# Patient Record
Sex: Male | Born: 1960 | Race: White | Hispanic: No | Marital: Single | Smoking: Former smoker
Health system: Southern US, Community
[De-identification: ages and names within clinical notes are randomized; demographics above are authoritative.]

## PROBLEM LIST (undated history)

## (undated) DIAGNOSIS — N200 Calculus of kidney: Secondary | ICD-10-CM

## (undated) DIAGNOSIS — C801 Malignant (primary) neoplasm, unspecified: Secondary | ICD-10-CM

## (undated) DIAGNOSIS — F410 Panic disorder [episodic paroxysmal anxiety] without agoraphobia: Secondary | ICD-10-CM

## (undated) DIAGNOSIS — R55 Syncope and collapse: Secondary | ICD-10-CM

## (undated) DIAGNOSIS — K08109 Complete loss of teeth, unspecified cause, unspecified class: Secondary | ICD-10-CM

## (undated) DIAGNOSIS — Z972 Presence of dental prosthetic device (complete) (partial): Secondary | ICD-10-CM

## (undated) DIAGNOSIS — I10 Essential (primary) hypertension: Secondary | ICD-10-CM

## (undated) DIAGNOSIS — M1990 Unspecified osteoarthritis, unspecified site: Secondary | ICD-10-CM

## (undated) HISTORY — PX: WISDOM TOOTH EXTRACTION: SHX21

## (undated) HISTORY — DX: Syncope and collapse: R55

## (undated) HISTORY — DX: Panic disorder (episodic paroxysmal anxiety): F41.0

## (undated) HISTORY — PX: WRIST SURGERY: SHX841

## (undated) HISTORY — DX: Essential (primary) hypertension: I10

---

## 1998-01-13 ENCOUNTER — Emergency Department (HOSPITAL_COMMUNITY): Admission: EM | Admit: 1998-01-13 | Discharge: 1998-01-13 | Payer: Self-pay | Admitting: Emergency Medicine

## 1998-01-14 ENCOUNTER — Emergency Department (HOSPITAL_COMMUNITY): Admission: EM | Admit: 1998-01-14 | Discharge: 1998-01-14 | Payer: Self-pay | Admitting: Emergency Medicine

## 1998-01-31 ENCOUNTER — Emergency Department (HOSPITAL_COMMUNITY): Admission: EM | Admit: 1998-01-31 | Discharge: 1998-01-31 | Payer: Self-pay | Admitting: Emergency Medicine

## 1999-01-02 ENCOUNTER — Emergency Department (HOSPITAL_COMMUNITY): Admission: EM | Admit: 1999-01-02 | Discharge: 1999-01-02 | Payer: Self-pay | Admitting: Emergency Medicine

## 1999-01-02 ENCOUNTER — Encounter: Payer: Self-pay | Admitting: Emergency Medicine

## 1999-05-26 ENCOUNTER — Emergency Department (HOSPITAL_COMMUNITY): Admission: EM | Admit: 1999-05-26 | Discharge: 1999-05-26 | Payer: Self-pay | Admitting: Emergency Medicine

## 2000-04-11 ENCOUNTER — Emergency Department (HOSPITAL_COMMUNITY): Admission: EM | Admit: 2000-04-11 | Discharge: 2000-04-11 | Payer: Self-pay | Admitting: Emergency Medicine

## 2000-09-19 ENCOUNTER — Emergency Department (HOSPITAL_COMMUNITY): Admission: EM | Admit: 2000-09-19 | Discharge: 2000-09-19 | Payer: Self-pay | Admitting: Emergency Medicine

## 2001-03-08 ENCOUNTER — Emergency Department (HOSPITAL_COMMUNITY): Admission: EM | Admit: 2001-03-08 | Discharge: 2001-03-08 | Payer: Self-pay | Admitting: Emergency Medicine

## 2001-03-08 ENCOUNTER — Encounter: Payer: Self-pay | Admitting: Emergency Medicine

## 2001-03-30 ENCOUNTER — Emergency Department (HOSPITAL_COMMUNITY): Admission: EM | Admit: 2001-03-30 | Discharge: 2001-03-30 | Payer: Self-pay | Admitting: Emergency Medicine

## 2003-01-22 ENCOUNTER — Encounter: Payer: Self-pay | Admitting: Emergency Medicine

## 2003-01-22 ENCOUNTER — Emergency Department (HOSPITAL_COMMUNITY): Admission: EM | Admit: 2003-01-22 | Discharge: 2003-01-22 | Payer: Self-pay | Admitting: Emergency Medicine

## 2003-11-19 ENCOUNTER — Emergency Department (HOSPITAL_COMMUNITY): Admission: EM | Admit: 2003-11-19 | Discharge: 2003-11-19 | Payer: Self-pay | Admitting: Emergency Medicine

## 2004-03-23 ENCOUNTER — Emergency Department (HOSPITAL_COMMUNITY): Admission: EM | Admit: 2004-03-23 | Discharge: 2004-03-23 | Payer: Self-pay | Admitting: Emergency Medicine

## 2004-04-22 ENCOUNTER — Emergency Department (HOSPITAL_COMMUNITY): Admission: EM | Admit: 2004-04-22 | Discharge: 2004-04-22 | Payer: Self-pay | Admitting: *Deleted

## 2004-12-08 ENCOUNTER — Emergency Department (HOSPITAL_COMMUNITY): Admission: EM | Admit: 2004-12-08 | Discharge: 2004-12-08 | Payer: Self-pay | Admitting: Emergency Medicine

## 2008-04-04 ENCOUNTER — Emergency Department (HOSPITAL_COMMUNITY): Admission: EM | Admit: 2008-04-04 | Discharge: 2008-04-04 | Payer: Self-pay | Admitting: Emergency Medicine

## 2008-04-07 ENCOUNTER — Emergency Department (HOSPITAL_COMMUNITY): Admission: EM | Admit: 2008-04-07 | Discharge: 2008-04-07 | Payer: Self-pay | Admitting: Emergency Medicine

## 2008-04-08 ENCOUNTER — Emergency Department (HOSPITAL_COMMUNITY): Admission: EM | Admit: 2008-04-08 | Discharge: 2008-04-08 | Payer: Self-pay | Admitting: Emergency Medicine

## 2009-02-19 ENCOUNTER — Emergency Department (HOSPITAL_COMMUNITY): Admission: EM | Admit: 2009-02-19 | Discharge: 2009-02-19 | Payer: Self-pay | Admitting: Emergency Medicine

## 2009-05-06 ENCOUNTER — Emergency Department (HOSPITAL_COMMUNITY): Admission: EM | Admit: 2009-05-06 | Discharge: 2009-05-06 | Payer: Self-pay | Admitting: Emergency Medicine

## 2009-05-08 ENCOUNTER — Emergency Department: Payer: Self-pay | Admitting: Unknown Physician Specialty

## 2009-05-30 ENCOUNTER — Emergency Department (HOSPITAL_COMMUNITY): Admission: EM | Admit: 2009-05-30 | Discharge: 2009-05-30 | Payer: Self-pay | Admitting: Emergency Medicine

## 2009-06-05 ENCOUNTER — Emergency Department (HOSPITAL_COMMUNITY): Admission: EM | Admit: 2009-06-05 | Discharge: 2009-06-05 | Payer: Self-pay | Admitting: Emergency Medicine

## 2009-06-25 ENCOUNTER — Emergency Department: Payer: Self-pay | Admitting: Emergency Medicine

## 2009-07-03 ENCOUNTER — Emergency Department (HOSPITAL_COMMUNITY): Admission: EM | Admit: 2009-07-03 | Discharge: 2009-07-03 | Payer: Self-pay | Admitting: Emergency Medicine

## 2009-07-07 ENCOUNTER — Emergency Department (HOSPITAL_COMMUNITY): Admission: EM | Admit: 2009-07-07 | Discharge: 2009-07-07 | Payer: Self-pay | Admitting: Emergency Medicine

## 2009-07-25 ENCOUNTER — Emergency Department (HOSPITAL_COMMUNITY): Admission: EM | Admit: 2009-07-25 | Discharge: 2009-07-25 | Payer: Self-pay | Admitting: Emergency Medicine

## 2009-07-30 ENCOUNTER — Emergency Department (HOSPITAL_COMMUNITY): Admission: EM | Admit: 2009-07-30 | Discharge: 2009-07-30 | Payer: Self-pay | Admitting: Emergency Medicine

## 2009-08-02 ENCOUNTER — Emergency Department: Payer: Self-pay | Admitting: Unknown Physician Specialty

## 2009-08-05 ENCOUNTER — Emergency Department (HOSPITAL_COMMUNITY): Admission: EM | Admit: 2009-08-05 | Discharge: 2009-08-05 | Payer: Self-pay | Admitting: Emergency Medicine

## 2009-08-24 ENCOUNTER — Emergency Department (HOSPITAL_COMMUNITY): Admission: EM | Admit: 2009-08-24 | Discharge: 2009-08-24 | Payer: Self-pay | Admitting: Emergency Medicine

## 2009-08-25 ENCOUNTER — Emergency Department: Payer: Self-pay | Admitting: Emergency Medicine

## 2009-08-26 ENCOUNTER — Emergency Department (HOSPITAL_COMMUNITY): Admission: EM | Admit: 2009-08-26 | Discharge: 2009-08-26 | Payer: Self-pay | Admitting: Emergency Medicine

## 2009-08-29 ENCOUNTER — Emergency Department: Payer: Self-pay | Admitting: Emergency Medicine

## 2009-09-02 ENCOUNTER — Emergency Department (HOSPITAL_COMMUNITY): Admission: EM | Admit: 2009-09-02 | Discharge: 2009-09-02 | Payer: Self-pay | Admitting: Emergency Medicine

## 2009-09-17 ENCOUNTER — Emergency Department: Payer: Self-pay | Admitting: Emergency Medicine

## 2009-09-22 ENCOUNTER — Encounter: Payer: Self-pay | Admitting: Family Medicine

## 2009-09-22 ENCOUNTER — Ambulatory Visit: Payer: Self-pay | Admitting: Family Medicine

## 2009-09-22 DIAGNOSIS — F410 Panic disorder [episodic paroxysmal anxiety] without agoraphobia: Secondary | ICD-10-CM | POA: Insufficient documentation

## 2009-09-22 DIAGNOSIS — I10 Essential (primary) hypertension: Secondary | ICD-10-CM | POA: Diagnosis present

## 2009-09-26 ENCOUNTER — Encounter: Payer: Self-pay | Admitting: Family Medicine

## 2009-09-29 ENCOUNTER — Observation Stay (HOSPITAL_COMMUNITY): Admission: EM | Admit: 2009-09-29 | Discharge: 2009-09-29 | Payer: Self-pay | Admitting: Emergency Medicine

## 2009-09-29 ENCOUNTER — Encounter (INDEPENDENT_AMBULATORY_CARE_PROVIDER_SITE_OTHER): Payer: Self-pay | Admitting: Emergency Medicine

## 2009-09-29 ENCOUNTER — Encounter: Payer: Self-pay | Admitting: *Deleted

## 2009-10-02 ENCOUNTER — Encounter: Payer: Self-pay | Admitting: Family Medicine

## 2009-10-02 ENCOUNTER — Ambulatory Visit: Payer: Self-pay | Admitting: Family Medicine

## 2009-10-02 LAB — CONVERTED CEMR LAB
ALT: 18 units/L (ref 0–53)
AST: 17 units/L (ref 0–37)
CO2: 28 meq/L (ref 19–32)
Calcium: 10 mg/dL (ref 8.4–10.5)
Chloride: 99 meq/L (ref 96–112)
Cholesterol: 190 mg/dL (ref 0–200)
HDL: 35 mg/dL — ABNORMAL LOW (ref >39)
Potassium: 3.6 meq/L (ref 3.5–5.3)
Sodium: 139 meq/L (ref 135–145)
Total CHOL/HDL Ratio: 5.4
VLDL: 25 mg/dL (ref 0–40)

## 2009-10-13 ENCOUNTER — Encounter: Payer: Self-pay | Admitting: Family Medicine

## 2009-10-14 ENCOUNTER — Ambulatory Visit: Payer: Self-pay | Admitting: Family Medicine

## 2009-10-14 DIAGNOSIS — M25539 Pain in unspecified wrist: Secondary | ICD-10-CM | POA: Insufficient documentation

## 2009-10-23 ENCOUNTER — Telehealth: Payer: Self-pay | Admitting: Family Medicine

## 2009-10-24 ENCOUNTER — Telehealth: Payer: Self-pay | Admitting: Family Medicine

## 2009-10-27 ENCOUNTER — Telehealth: Payer: Self-pay | Admitting: Family Medicine

## 2009-10-31 ENCOUNTER — Emergency Department (HOSPITAL_COMMUNITY): Admission: EM | Admit: 2009-10-31 | Discharge: 2009-10-31 | Payer: Self-pay | Admitting: Emergency Medicine

## 2009-11-17 ENCOUNTER — Ambulatory Visit: Payer: Self-pay | Admitting: Family Medicine

## 2009-11-17 DIAGNOSIS — G4700 Insomnia, unspecified: Secondary | ICD-10-CM | POA: Insufficient documentation

## 2009-12-01 ENCOUNTER — Telehealth: Payer: Self-pay | Admitting: Family Medicine

## 2009-12-07 ENCOUNTER — Emergency Department (HOSPITAL_COMMUNITY): Admission: EM | Admit: 2009-12-07 | Discharge: 2009-12-07 | Payer: Self-pay | Admitting: Emergency Medicine

## 2009-12-08 ENCOUNTER — Ambulatory Visit: Payer: Self-pay | Admitting: Family Medicine

## 2009-12-08 ENCOUNTER — Encounter: Payer: Self-pay | Admitting: Family Medicine

## 2009-12-17 ENCOUNTER — Ambulatory Visit: Payer: Self-pay | Admitting: Family Medicine

## 2009-12-17 ENCOUNTER — Telehealth: Payer: Self-pay | Admitting: Family Medicine

## 2010-01-02 ENCOUNTER — Ambulatory Visit: Payer: Self-pay | Admitting: Family Medicine

## 2010-01-02 ENCOUNTER — Encounter: Payer: Self-pay | Admitting: Family Medicine

## 2010-01-02 LAB — CONVERTED CEMR LAB
Bilirubin Urine: NEGATIVE
Nitrite: NEGATIVE
Protein, U semiquant: NEGATIVE
Urobilinogen, UA: 0.2
pH: 7

## 2010-01-05 ENCOUNTER — Ambulatory Visit: Payer: Self-pay | Admitting: Family Medicine

## 2010-01-05 LAB — CONVERTED CEMR LAB
Glucose, Urine, Semiquant: NEGATIVE
Nitrite: NEGATIVE
Protein, U semiquant: NEGATIVE
WBC Urine, dipstick: NEGATIVE
pH: 6.5

## 2010-01-09 ENCOUNTER — Ambulatory Visit (HOSPITAL_COMMUNITY): Admission: RE | Admit: 2010-01-09 | Discharge: 2010-01-09 | Payer: Self-pay | Admitting: Family Medicine

## 2010-01-12 ENCOUNTER — Encounter: Payer: Self-pay | Admitting: Sports Medicine

## 2010-01-12 ENCOUNTER — Telehealth: Payer: Self-pay | Admitting: Family Medicine

## 2010-01-12 ENCOUNTER — Ambulatory Visit (HOSPITAL_COMMUNITY): Admission: RE | Admit: 2010-01-12 | Discharge: 2010-01-12 | Payer: Self-pay | Admitting: Family Medicine

## 2010-01-12 ENCOUNTER — Ambulatory Visit: Payer: Self-pay | Admitting: Family Medicine

## 2010-01-12 DIAGNOSIS — R55 Syncope and collapse: Secondary | ICD-10-CM | POA: Insufficient documentation

## 2010-01-12 LAB — CONVERTED CEMR LAB
ALT: 16 units/L (ref 0–53)
AST: 20 units/L (ref 0–37)
Albumin: 5 g/dL (ref 3.5–5.2)
Alkaline Phosphatase: 68 units/L (ref 39–117)
BUN: 12 mg/dL (ref 6–23)
Basophils Absolute: 0 K/uL (ref 0.0–0.1)
Basophils Relative: 0 % (ref 0–1)
CO2: 21 meq/L (ref 19–32)
Calcium: 9.9 mg/dL (ref 8.4–10.5)
Chloride: 103 meq/L (ref 96–112)
Creatinine, Ser: 0.97 mg/dL (ref 0.40–1.50)
Eosinophils Absolute: 0.3 K/uL (ref 0.0–0.7)
Eosinophils Relative: 4 % (ref 0–5)
Glucose, Bld: 84 mg/dL (ref 70–99)
HCT: 42 % (ref 39.0–52.0)
Hemoglobin: 14.3 g/dL (ref 13.0–17.0)
Lymphocytes Relative: 22 % (ref 12–46)
Lymphs Abs: 2 K/uL (ref 0.7–4.0)
MCHC: 34 g/dL (ref 30.0–36.0)
MCV: 85 fL (ref 78.0–100.0)
Monocytes Absolute: 0.7 10*3/uL (ref 0.1–1.0)
Monocytes Relative: 7 % (ref 3–12)
Neutro Abs: 6.1 10*3/uL (ref 1.7–7.7)
Neutrophils Relative %: 67 % (ref 43–77)
Platelets: 313 K/uL (ref 150–400)
Potassium: 3.5 meq/L (ref 3.5–5.3)
RBC: 4.94 M/uL (ref 4.22–5.81)
RDW: 13.7 % (ref 11.5–15.5)
Sodium: 139 meq/L (ref 135–145)
TSH: 3.813 microintl units/mL (ref 0.350–4.500)
Total Bilirubin: 0.7 mg/dL (ref 0.3–1.2)
Total Protein: 7.3 g/dL (ref 6.0–8.3)
WBC: 9.1 10*3/microliter (ref 4.0–10.5)

## 2010-01-13 ENCOUNTER — Telehealth: Payer: Self-pay | Admitting: Family Medicine

## 2010-01-15 ENCOUNTER — Encounter: Payer: Self-pay | Admitting: Family Medicine

## 2010-01-29 ENCOUNTER — Emergency Department: Payer: Self-pay | Admitting: Unknown Physician Specialty

## 2010-01-30 ENCOUNTER — Emergency Department (HOSPITAL_COMMUNITY): Admission: EM | Admit: 2010-01-30 | Discharge: 2010-01-30 | Payer: Self-pay | Admitting: Family Medicine

## 2010-02-02 ENCOUNTER — Encounter: Payer: Self-pay | Admitting: Family Medicine

## 2010-02-02 ENCOUNTER — Ambulatory Visit: Payer: Self-pay | Admitting: Family Medicine

## 2010-02-02 ENCOUNTER — Encounter: Payer: Self-pay | Admitting: *Deleted

## 2010-02-12 ENCOUNTER — Ambulatory Visit: Payer: Self-pay | Admitting: Family Medicine

## 2010-02-17 ENCOUNTER — Encounter: Payer: Self-pay | Admitting: Family Medicine

## 2010-02-18 ENCOUNTER — Telehealth: Payer: Self-pay | Admitting: Family Medicine

## 2010-02-23 ENCOUNTER — Telehealth: Payer: Self-pay | Admitting: Family Medicine

## 2010-02-23 ENCOUNTER — Ambulatory Visit: Payer: Self-pay | Admitting: Family Medicine

## 2010-03-02 ENCOUNTER — Ambulatory Visit: Payer: Self-pay | Admitting: Internal Medicine

## 2010-03-11 ENCOUNTER — Ambulatory Visit: Payer: Self-pay | Admitting: Family Medicine

## 2010-03-11 ENCOUNTER — Telehealth: Payer: Self-pay | Admitting: Family Medicine

## 2010-03-17 ENCOUNTER — Encounter: Payer: Self-pay | Admitting: Family Medicine

## 2010-03-19 ENCOUNTER — Ambulatory Visit: Payer: Self-pay | Admitting: Family Medicine

## 2010-03-30 ENCOUNTER — Emergency Department (HOSPITAL_COMMUNITY): Admission: EM | Admit: 2010-03-30 | Discharge: 2010-03-30 | Payer: Self-pay | Admitting: Emergency Medicine

## 2010-04-03 ENCOUNTER — Encounter (INDEPENDENT_AMBULATORY_CARE_PROVIDER_SITE_OTHER): Payer: Self-pay | Admitting: *Deleted

## 2010-04-14 ENCOUNTER — Telehealth: Payer: Self-pay | Admitting: Family Medicine

## 2010-04-14 ENCOUNTER — Ambulatory Visit: Payer: Self-pay | Admitting: Family Medicine

## 2010-04-15 ENCOUNTER — Encounter: Payer: Self-pay | Admitting: *Deleted

## 2010-04-15 ENCOUNTER — Telehealth: Payer: Self-pay | Admitting: Family Medicine

## 2010-04-15 ENCOUNTER — Ambulatory Visit: Payer: Self-pay | Admitting: Family Medicine

## 2010-04-19 ENCOUNTER — Emergency Department: Payer: Self-pay | Admitting: Unknown Physician Specialty

## 2010-04-21 ENCOUNTER — Ambulatory Visit: Payer: Self-pay | Admitting: Family Medicine

## 2010-05-04 ENCOUNTER — Telehealth: Payer: Self-pay | Admitting: Family Medicine

## 2010-05-13 ENCOUNTER — Telehealth: Payer: Self-pay | Admitting: Family Medicine

## 2010-05-13 ENCOUNTER — Ambulatory Visit: Payer: Self-pay | Admitting: Family Medicine

## 2010-06-09 ENCOUNTER — Ambulatory Visit: Payer: Self-pay | Admitting: Family Medicine

## 2010-07-10 ENCOUNTER — Emergency Department (HOSPITAL_COMMUNITY): Admission: EM | Admit: 2010-07-10 | Discharge: 2010-07-10 | Payer: Self-pay | Admitting: Emergency Medicine

## 2010-07-13 ENCOUNTER — Ambulatory Visit: Payer: Self-pay | Admitting: Family Medicine

## 2010-07-13 DIAGNOSIS — M79609 Pain in unspecified limb: Secondary | ICD-10-CM | POA: Insufficient documentation

## 2010-11-10 NOTE — Progress Notes (Signed)
Summary: referral prob  Phone Note Call from Patient Call back at Home Phone [phone]   Caller: Patient Summary of Call: was given referral and they have denied him and he has a broken wrist and needs to know what to do. Initial call taken by: Audie Clear,  Feb 18, 2010 2:13 PM  Follow-up for Phone Call         spoke with patient . he states he fell today and fell directly on wrist. wrist is swollen and very painful. suggested given the time of day that he go to urgent care for evaluation now. advised will send message to Dr. Georgina Snell to advise about other options for for referral since there are no orthopedist that are available thru [facility]. Follow-up by: Marcell Barlow RN,  Feb 18, 2010 3:25 PM

## 2010-11-10 NOTE — Miscellaneous (Signed)
Summary: walk in  Clinical Lists Changes came in asking for f/u & referral to ortho. last Thursday he was using a hole driller on concrete & the device jamed & twisted his wrist & fx bones. some were sticking out of arm. [facility] went to [facility]. they wrapped it & told him to come here for referral to an ortho office. placed in 8:30 slot.Elige Radon RN  February 02, 2010 8:31 AM

## 2010-11-10 NOTE — Assessment & Plan Note (Signed)
Summary: teeth pain & anxiety/[place]   Vital Signs:  Patient profile:   50 year old male Weight:      158 pounds Temp:     97.8 degrees F oral Pulse rate:   81 / minute BP sitting:   123 / 79  (left arm) Cuff size:   regular  Vitals Entered By: Schuyler Amor CMA (December 08, 2009 9:29 AM) CC: dental pain and anxiety Is Patient Diabetic? No Pain Assessment Patient in pain? yes     Location: dental Intensity: 10   Primary Care Provider:  Lynne Leader  CC:  dental pain and anxiety.  History of Present Illness: CC: dental pain and anxiety   1. anxiety - feeling really "nervous" since friday.  Trazodone not working for sleep, still feels restless.  Thinks stemming from tooth pain (see #2)  2. dental pain - 6 teeth pulled 2 wks ago.  Given percocets for pain by Dr. Georgina Snell.  Pt states allergic to vicodin and motrin Q 6 hours not working.  dentist would not prescribe percocets.  Has appt 12/23/2009 for removal of more teeth.   currently on amoxicillin per patient.  Asks for refills on HCTZ and zoloft.  Has 5 refills left on sertraline and 6 refills left on HCTZ, advised can call pharmacy to refill.  Habits & Providers  Alcohol-Tobacco-Diet     Tobacco Status: never  Current Medications (verified): 1)  Percocet 5-325 Mg Tabs (Oxycodone-Acetaminophen) .... Take 1 Pill By Mouth Q 6 Hrs As Needed Pain 2)  Penicillin V Potassium 500 Mg Tabs (Penicillin V Potassium) .Marland Kitchen.. 1 Tab By Mouth Two Times A Day Untill Tooth Extraction 3)  Zoloft 50 Mg Tabs (Sertraline Hcl) .... Take 1 Per Day For 1 Week Then $RemoveB'75mg'QxxKdnsJ$  Per Day For 1 Week Then 2 Per Day ($Remove'100mg'xFuUiMK$ ) Until You See Me. 4)  Hydrochlorothiazide 25 Mg Tabs (Hydrochlorothiazide) .... Take 1 Pill By Mouth Daily 5)  Trazodone Hcl 50 Mg Tabs (Trazodone Hcl) .Marland Kitchen.. 1 By Mouth At Bedtime As Needed Insomnia  Allergies (verified): 1)  Hydrocodone PMH-FH-SH reviewed for relevance  Physical Exam  General:  VS noted. Well appearing NAD Head:   Normocephalic and atraumatic without obvious abnormalities. No apparent alopecia or balding. Mouth:  s/p dental work.  slight swelling right upper premolars.   Neck:  No deformities, masses, or tenderness noted.  No LAD Psych:  anxious, nervous.    Impression & Recommendations:  Problem # 1:  DENTAL CARIES OF ROOT SURFACE (ICD-521.08)  refilled percocet #30, RF 0.  Advised to use only as breakthrough after scheduled motrin.  Advised only temporary narcotics while he gets through dental work and as has allergy to hydrocodone (GI upset and rash).  advised to ice jaw.  Orders: [place]- Est Level  3 ([address])  Problem # 2:  PANIC DISORDER (ICD-300.01)  much improved on zoloft, continue this.  advised will treat as pain exacerbating anxiety, and to return if not improved after controlling pain. His updated medication list for this problem includes:    Zoloft 50 Mg Tabs (Sertraline hcl) .Marland Kitchen... Take 1 per day for 1 week then $RemoveB'75mg'uzefQWfe$  per day for 1 week then 2 per day ($Remove'100mg'OMwAsgo$ ) until you see me.    Trazodone Hcl 50 Mg Tabs (Trazodone hcl) .Marland Kitchen... 1 by mouth at bedtime as needed insomnia  Orders: [place]- Est Level  3 ([address]213)  Complete Medication List: 1)  Percocet 5-325 Mg Tabs (Oxycodone-acetaminophen) .... Take 1 pill by mouth q 6 hrs as needed  pain 2)  Penicillin V Potassium 500 Mg Tabs (Penicillin v potassium) .Marland Kitchen.. 1 tab by mouth two times a day untill tooth extraction 3)  Zoloft 50 Mg Tabs (Sertraline hcl) .... Take 1 per day for 1 week then $RemoveB'75mg'Zuxqmbei$  per day for 1 week then 2 per day ($Remove'100mg'ptsIDBV$ ) until you see me. 4)  Hydrochlorothiazide 25 Mg Tabs (Hydrochlorothiazide) .... Take 1 pill by mouth daily 5)  Trazodone Hcl 50 Mg Tabs (Trazodone hcl) .Marland Kitchen.. 1 by mouth at bedtime as needed insomnia  Patient Instructions: 1)  I will give you another prescription for percocets to last you until your dental work next month. 2)  Keep doing motrin and take percocets for breakthrough pain. 3)  Ice jaw 5-10 min 3 x a day to  hlep with pain. 4)  Keep taking amoxicillin.  You can call your pharmacy for refills of HCTZ and sertraline. 5)  Call clinic with questions.  pleasure to see you today. Prescriptions: PERCOCET 5-325 MG TABS (OXYCODONE-ACETAMINOPHEN) take 1 pill by mouth q 6 hrs as needed pain  #30 x 0   Entered and Authorized by:   Ria Bush  MD   Signed by:   Ria Bush  MD on 12/08/2009   Method used:   Print then Give to Patient   RxID:   [id number]

## 2010-11-10 NOTE — Assessment & Plan Note (Signed)
Summary: R flank pain hx stones/[place]   Vital Signs:  Patient profile:   50 year old male Weight:      165 pounds Temp:     97.8 degrees F oral Pulse rate:   67 / minute BP sitting:   130 / 77  (left arm) Cuff size:   regular  Vitals Entered By: Schuyler Amor CMA (January 02, 2010 2:42 PM) CC: right flank pain and bruise Pain Assessment Patient in pain? yes     Location: flank pain Intensity: 10   Primary Care Provider:  Lynne Leader  CC:  right flank pain and bruise.  History of Present Illness: CC: R kidney pain and bruising  Had appt at 1:30pm, presents 1 hour late as "walk in".    50 yo with h/o pain control issues.  I saw him x 2 previously with toothache and ear pain, prescribed percocet x 2.  States mouth and ears doing great now.  7d h/o right flank pain.  h/o kidney stones in past.  Pain described as constant dull throbbing pain, shooting sharp pain as well localized to flank, doesn't radiate to groin.  has small 3cm bruise on skin.  + voids blood in AM.  No dysuria, frequency, urgency.  No nausea/vomiting, other abd pain.  Non smoker.  Taking Naprosyn but not really helping.  Percocets did not fill from last prescription - states lost script.  Habits & Providers  Alcohol-Tobacco-Diet     Tobacco Status: never  Current Medications (verified): 1)  Zoloft 50 Mg Tabs (Sertraline Hcl) .... Take 1 Per Day For 1 Week Then $RemoveB'75mg'GrVZpYkI$  Per Day For 1 Week Then 2 Per Day ($Remove'100mg'VASYsFz$ ) Until You See Me. 2)  Hydrochlorothiazide 25 Mg Tabs (Hydrochlorothiazide) .... Take 1 Pill By Mouth Daily 3)  Trazodone Hcl 50 Mg Tabs (Trazodone Hcl) .Marland Kitchen.. 1 By Mouth At Bedtime As Needed Insomnia 4)  Naprosyn 500 Mg Tabs (Naproxen) .... Take One By Mouth Two Times A Day X 10 Days Then As Needed  Allergies: 1)  Hydrocodone  Past History:  Past medical, surgical, family and social histories (including risk factors) reviewed for relevance to current acute and chronic problems.  Past Medical  History: Reviewed history from 09/22/2009 and no changes required. Left hand and wrist fractures that require surgury.  However patient cannot afford surgury at this time.  He wears a brace and is able to function at work.  HTN SBP 160s in 09/2009 Panic Disorder Dental caries with tooth fractures.  Hx of shingles in the past.   Family History: Reviewed history from 09/22/2009 and no changes required. Father with MI at 50 yo Brother with brain tumor Multiple males with prostate cancer  Social History: Reviewed history from 09/22/2009 and no changes required. Lives in [place] with girlfried. Works part time at a [employer].  No tobacco, alcahol or drugs.  Has a pain contract currently on 09/2009 due to patient with history of multiple ED visits for dental pain prior to establishing as a patient.   Physical Exam  General:  VS noted, initially appears uncomfortable but then moves around room without any grimace. Abdomen:  Bowel sounds positive,abdomen soft and non-tender without masses, organomegaly or hernias noted.  + CVA tenderness on right: says "ouch that hurts" but doesn't grimace or wince when pounding or right flank.   Impression & Recommendations:  Problem # 1:  FLANK PAIN, RIGHT (ICD-789.09) UA negative for blood.  No fevers/nausea/dysuria/systemic sxs to consider pyelo or UTI *(and  UA normal).  Treat with continued naprosyn again, asked to drink plenty of fluids and cranberry juice.  suspect pain-seeking.  f/u with PCP Monday.  or could be msk and treat with nsaids.  NO MORE PERCOCETS, advised as much.  Pt has pain contract with PCP.  The following medications were removed from the medication list:    Percocet 5-325 Mg Tabs (Oxycodone-acetaminophen) .Marland Kitchen... Take 1 pill by mouth q 6 hrs as needed pain His updated medication list for this problem includes:    Naprosyn 500 Mg Tabs (Naproxen) .Marland Kitchen... Take one by mouth two times a day x 10 days then as  needed  Orders: [place]- Est  Level 4 (99214) Urinalysis-[facility] (00000)  Complete Medication List: 1)  Zoloft 50 Mg Tabs (Sertraline hcl) .... Take 1 per day for 1 week then $RemoveB'75mg'uvLhWhYN$  per day for 1 week then 2 per day ($Remove'100mg'qucnSnu$ ) until you see me. 2)  Hydrochlorothiazide 25 Mg Tabs (Hydrochlorothiazide) .... Take 1 pill by mouth daily 3)  Trazodone Hcl 50 Mg Tabs (Trazodone hcl) .Marland Kitchen.. 1 by mouth at bedtime as needed insomnia 4)  Naprosyn 500 Mg Tabs (Naproxen) .... Take one by mouth two times a day x 10 days then as needed  Patient Instructions: 1)  Keep appointment with Dr. Georgina Snell. 2)  Your urine looks normal today.  This could still be a kidney stone or a muscle sprain/strain from the bruise from hitting yourself against something. 3)  Treat with anti inflammatory twice daily (another course of naprosyn), ice/heating pad, and lots of fluids (in case it's stone) to help pass it.  Remember - cranberry juice is good for bladder health. 4)  Call clinic with questions. 5)  Returnsooner if having fevers, nausea, burning with peeing. Prescriptions: NAPROSYN 500 MG TABS (NAPROXEN) take one by mouth two times a day x 10 days then as needed  #30 x 0   Entered and Authorized by:   Ria Bush  MD   Signed by:   Ria Bush  MD on 01/02/2010   Method used:   Faxed to ...       [facility] Department (retail)       [address] [place],   [address]       Ph: [phone]       Fax: [phone]   RxID:   [id number]   Laboratory Results   Urine Tests  Date/Time Received: January 02, 2010 3:15 PM  Date/Time Reported: January 02, 2010 3:20 PM   Routine Urinalysis   Color: yellow Appearance: Clear Glucose: negative   (Normal Range: Negative) Bilirubin: negative   (Normal Range: Negative) Ketone: negative   (Normal Range: Negative) Spec. Gravity: 1.010   (Normal Range: 1.003-1.035) Blood: negative   (Normal Range: Negative) pH: 7.0   (Normal Range: 5.0-8.0) Protein:  negative   (Normal Range: Negative) Urobilinogen: 0.2   (Normal Range: 0-1) Nitrite: negative   (Normal Range: Negative) Leukocyte Esterace: negative   (Normal Range: Negative)    Comments: ...........test performed by...........Marland KitchenHedy Camara, CMA

## 2010-11-10 NOTE — Assessment & Plan Note (Signed)
Summary: jaw pain/[place]   Vital Signs:  Patient profile:   50 year old male Height:      66 inches Weight:      162 pounds BMI:     26.24 Temp:     98.5 degrees F oral Pulse rate:   72 / minute BP sitting:   131 / 88  (right arm) Cuff size:   regular  Vitals Entered By: Tessie Fass CMA (March 19, 2010 3:47 PM) CC: headache and jawpain Is Patient Diabetic? No Pain Assessment Patient in pain? yes     Location: head and jaw Intensity: 10   Primary Care Provider:  Clementeen Graham  CC:  headache and jawpain.  History of Present Illness: 50 year old M:  1. Jaw Pain/HA: x several days, states that it is different from previous visit, now c/o bilateral jaw pain when he chews and jawns, it is an ache that becomes sharp when he opens his jaw wide, and with a slight "popping feeling." states that the headache is "more like a tension-type headache now and not bad," he had several teeth extracted last week and has had the pain since. He has not been able to sleep lately 2/2 headache, so he  has been yawning more.  Habits & Providers  Alcohol-Tobacco-Diet     Tobacco Status: never  Current Medications (verified): 1)  Zoloft 100 Mg Tabs (Sertraline Hcl) .Marland Kitchen.. 1 By Mouth Daily 2)  Sumatriptan Succinate 25 Mg Tabs (Sumatriptan Succinate) .... Take 1 Tab At The Onset of Headache.  May Repeat Times 1 in 2 Hours If No Relief. 3)  Tramadol Hcl 50 Mg Tabs (Tramadol Hcl) .... Take 1 Tab By Mouth Every 6 Hours As Needed For Pain 4)  Mobic 7.5 Mg Tabs (Meloxicam) .... One To Two By Mouth Daily As Needed For Pain and Inflammation 5)  Nortriptyline Hcl 50 Mg Caps (Nortriptyline Hcl) .... One By Mouth Each Night  Allergies (verified): 1)  Hydrocodone PMH-FH-SH reviewed for relevance  Review of Systems General:  Denies chills, fever, and weight loss. ENT:  Complains of difficulty swallowing, ear discharge, earache, nasal congestion, ringing in ears, and sore throat. CV:  Denies chest pain or  discomfort and shortness of breath with exertion. Resp:  Denies cough. GI:  Denies loss of appetite, nausea, and vomiting. MS:  Complains of joint pain; denies joint redness and joint swelling. Derm:  Denies lesion(s) and rash. Neuro:  Complains of headaches; denies brief paralysis, inability to speak, numbness, tingling, visual disturbances, and weakness.  Physical Exam  General:  Well-developed,well-nourished, in no acute distress; alert, appropriate and cooperative throughout examination. Vitals reviewed. Head:  Normocephalic and atraumatic without obvious abnormalities.  Eyes:  No corneal or conjunctival inflammation noted. EOMI. Perrla.Vision grossly normal. Ears:  R ear normal and L ear normal.   Nose:  External nasal examination shows no deformity or inflammation. Nasal mucosa are pink and moist without lesions or exudates. Mouth:  Oral mucosa and oropharynx without lesions or exudates.  Tenderness to palpation at bilateral TMJs. Decreased incisal opening 2/2 pain. Left click heard and distraction palpated with incisal opening. Neck:  No deformities, masses, or tenderness noted.   Impression & Recommendations:  Problem # 1:  TEMPOROMANDIBULAR JOINT PAIN (ICD-524.62) Assessment New Educated patient re: diagnosis, treatment, prevention of further complications. Rec anti-inflammatory medication daily, avoiding excessive chewing, demonstrated neck stretches, f/u with dentist for bite guard. Rx Nortriptyline for help with pain and mucle relaxation (patient has hx of multiple pain  complaints as well).  Orders: [place]- Est Level  3 ([address]109)  Complete Medication List: 1)  Zoloft 100 Mg Tabs (Sertraline hcl) .Marland Kitchen.. 1 by mouth daily 2)  Sumatriptan Succinate 25 Mg Tabs (Sumatriptan succinate) .... Take 1 tab at the onset of headache.  may repeat times 1 in 2 hours if no relief. 3)  Tramadol Hcl 50 Mg Tabs (Tramadol hcl) .... Take 1 tab by mouth every 6 hours as needed for pain 4)  Mobic 7.5 Mg  Tabs (Meloxicam) .... One to two by mouth daily as needed for pain and inflammation 5)  Nortriptyline Hcl 50 Mg Caps (Nortriptyline hcl) .... One by mouth each night  Other Orders: Ketorolac-Toradol 15mg  (U0454)  Patient Instructions: 1)  I am prescribing Mobic or your pain and inflammation. 2)  Take the Nortriptyline at night to help with muscle relaxation and pain. Prescriptions: ZOLOFT 100 MG TABS (SERTRALINE HCL) 1 by mouth daily  #30 x 0   Entered and Authorized by:   Briscoe Deutscher DO   Signed by:   Briscoe Deutscher DO on 03/19/2010   Method used:   Print then Give to Patient   RxID:   [id number] NORTRIPTYLINE HCL 50 MG CAPS (NORTRIPTYLINE HCL) one by mouth each night  #30 x 0   Entered and Authorized by:   Briscoe Deutscher DO   Signed by:   Briscoe Deutscher DO on 03/19/2010   Method used:   Print then Give to Patient   RxID:   [id number] MOBIC 7.5 MG TABS (MELOXICAM) one to two by mouth daily as needed for pain and inflammation  #60 x 0   Entered and Authorized by:   Briscoe Deutscher DO   Signed by:   Briscoe Deutscher DO on 03/19/2010   Method used:   Print then Give to Patient   RxID:   [id number]    Medication Administration  Injection # 1:    Medication: Ketorolac-Toradol 15mg     Diagnosis: HEADACHE (ICD-784.0)    Route: IM    Site: LUOQ gluteus    Exp Date: 09/11/2011    Lot #: [id number]    Mfr: hospira    Comments: 60mg /11ml given    Patient tolerated injection without complications    Given by: Schuyler Amor CMA (March 19, 2010 4:09 PM)  Orders Added: 1)  Ketorolac-Toradol 15mg  [J1885] 2)  [facility]- Est Level  3 [25956]

## 2010-11-10 NOTE — Miscellaneous (Signed)
Summary: Re: orthopedic referral  Clinical Lists Changes   Received notification from [facility] that they are unable to complete the orthopedic referral at this time due to lack of volunteer physicians in this speciality group. They will notify pateint when they can process the referral. Marcell Barlow RN  Feb 17, 2010 11:10 AM

## 2010-11-10 NOTE — Miscellaneous (Signed)
Summary: anxiety & teeth pain  Clinical Lists Changes called very worried about the anxiety & worry he is experiencing. states the zoloft is working but he is very panicked. also c/o teeth pain. just had 6 pulled & has appt in March to get more pulled. using motrin w/o relief. appt with Dr. Danise Mina at 9:45.Elige Radon RN  December 08, 2009 8:41 AM

## 2010-11-10 NOTE — Assessment & Plan Note (Signed)
Summary: FU/KH   Vital Signs:  Patient profile:   50 year old male Height:      66.5 inches Weight:      164 pounds BMI:     26.17 Temp:     98.4 degrees F oral Pulse rate:   82 / minute BP sitting:   117 / 81  (left arm) Cuff size:   regular  Vitals Entered By: Schuyler Amor CMA (January 05, 2010 1:38 PM) CC: right flank pain x 10 days Is Patient Diabetic? No Pain Assessment Patient in pain? yes     Location: right flank Intensity: 10   Primary Care Provider:  Lynne Leader  CC:  right flank pain x 10 days.  History of Present Illness: ABDOMINAL PAIN Location: Right flank Onset: 10 days Description: Radiates to right anterior iliac crest Modifying factors: Moving around hurts   Symptoms Nausea/Vomiting: No Diarrhea: No Constipation: No Melena/BRBPR: No Hematemesis: No Anorexia: No Fever/Chills: Mild fever occasionally relieved by ibuprofen Jaundice: No Dysuria: No Back pain: Yes Rash: No Weight loss: No STD exposure: No Alcohol use: None NSAID use: Yes   TEETH: Mr Lamartina has all of his teeth removed.  He has no residual mouth pain.  He is scheduled to get dentures.  He feels well otherwise.  ANXIETY: Mr Schranz is feelig much better than he has been.  He is having much less panic attacks and has less general anxiety.  He is happy with how he is doing.      Habits & Providers  Alcohol-Tobacco-Diet     Tobacco Status: never  Current Problems (verified): 1)  Flank Pain, Right  (ICD-789.09) 2)  Insomnia  (ICD-780.52) 3)  Wrist Pain, Left  (ICD-719.43) 4)  Panic Disorder  (ICD-300.01) 5)  Hypertension, Benign Essential  (ICD-401.1)  Current Medications (verified): 1)  Zoloft 100 Mg Tabs (Sertraline Hcl) .Marland Kitchen.. 1 By Mouth Daily 2)  Hydrochlorothiazide 25 Mg Tabs (Hydrochlorothiazide) .... Take 1 Pill By Mouth Daily 3)  Naprosyn 500 Mg Tabs (Naproxen) .... Take One By Mouth Two Times A Day X 10 Days Then As Needed 4)  Percocet 5-325 Mg Tabs  (Oxycodone-Acetaminophen) .Marland Kitchen.. 1-2 By Mouth Q4 Hrs As Needed Pain  Allergies (verified): 1)  Hydrocodone  Past History:  Past Surgical History: All teeth removed 12/2009.  Awaiting dentures.  Family History: Reviewed history from 09/22/2009 and no changes required. Father with MI at 33 yo Brother with brain tumor Multiple males with prostate cancer  Social History: Reviewed history from 09/22/2009 and no changes required. Lives in [place] with girlfried. Works part time at a cultured [employer].  No tobacco, alcahol or drugs.  Has a pain contract currently on 09/2009 due to patient with history of multiple ED visits for dental pain prior to establishing as a patient.   Review of Systems  The patient denies anorexia, fever, vision loss, decreased hearing, hoarseness, chest pain, syncope, dyspnea on exertion, prolonged cough, headaches, hemoptysis, melena, hematochezia, hematuria, muscle weakness, suspicious skin lesions, difficulty walking, depression, unusual weight change, abnormal bleeding, and enlarged lymph nodes.    Physical Exam  General:  VS noted.  Well uncomfortable appearing. Mouth:  No teeth, no erythemia or exudate.  Lungs:  Normal respiratory effort, chest expands symmetrically. Lungs are clear to auscultation, no crackles or wheezes. Heart:  Normal rate and regular rhythm. S1 and S2 normal without gallop, murmur, click, rub or other extra sounds. Abdomen:  Bowel sounds positive,abdomen soft and non-tender without masses,  + CVA tenderness  on right: radiates to right flank.  Winces with palpation. Psych:  Oriented X3, memory intact for recent and remote, normally interactive, good eye contact, not anxious appearing, not depressed appearing, not agitated, not suicidal, and not homicidal.     Impression & Recommendations:  Problem # 1:  FLANK PAIN, RIGHT (ICD-789.09) Assessment Deteriorated Worsening.  DDX includes renal stone, or other renal pathology.  Also  possibly is MSK injury. Plan for a non-contrast CT scan to evaulate for renal stone, and hydronephrosis.   Will provide 15 percocets for pain and follow up CT result and further intervention if required.   His updated medication list for this problem includes:    Naprosyn 500 Mg Tabs (Naproxen) .Marland Kitchen... Take one by mouth two times a day x 10 days then as needed    Percocet 5-325 Mg Tabs (Oxycodone-acetaminophen) .Marland Kitchen... 1-2 by mouth q4 hrs as needed pain  Orders: Urinalysis-[facility] (00000) CT without Contrast (CT w/o contrast) [place]- Est  Level 4 (51025)  Problem # 2:  PANIC DISORDER (ICD-300.01) Assessment: Improved  Panic/General anxiety is well controlled with Zoloft.  He feels much better.  The following medications were removed from the medication list:    Trazodone Hcl 50 Mg Tabs (Trazodone hcl) .Marland Kitchen... 1 by mouth at bedtime as needed insomnia His updated medication list for this problem includes:    Zoloft 100 Mg Tabs (Sertraline hcl) .Marland Kitchen... 1 by mouth daily  Orders: [place]- Est  Level 4 (85277)  Problem # 3:  HYPERTENSION, BENIGN ESSENTIAL (ICD-401.1)  Well controlled. No need for changes today.  His updated medication list for this problem includes:    Hydrochlorothiazide 25 Mg Tabs (Hydrochlorothiazide) .Marland Kitchen... Take 1 pill by mouth daily  BP today: 117/81 Prior BP: 130/77 (01/02/2010)  Labs Reviewed: K+: 3.6 (10/02/2009) Creat: : 1.15 (10/02/2009)   Chol: 190 (10/02/2009)   HDL: 35 (10/02/2009)   LDL: 130 (10/02/2009)   TG: 126 (10/02/2009)  Orders: [place]- Est  Level 4 (99214)  Complete Medication List: 1)  Zoloft 100 Mg Tabs (Sertraline hcl) .Marland Kitchen.. 1 by mouth daily 2)  Hydrochlorothiazide 25 Mg Tabs (Hydrochlorothiazide) .... Take 1 pill by mouth daily 3)  Naprosyn 500 Mg Tabs (Naproxen) .... Take one by mouth two times a day x 10 days then as needed 4)  Percocet 5-325 Mg Tabs (Oxycodone-acetaminophen) .Marland Kitchen.. 1-2 by mouth q4 hrs as needed pain  Patient Instructions: 1)  Thank you  for seeing me today. 2)  If you have chest pain, difficulty breathing, fevers over 102 that does not get better with tylenol please call us or see a doctor.  3)  Please go to the hospital for a CT scan.  I will call with results. 4)  Follow up in 4 weeks or sooner.  I will call and you can make an appointment. Prescriptions: PERCOCET 5-325 MG TABS (OXYCODONE-ACETAMINOPHEN) 1-2 by mouth q4 hrs as needed pain  #15 x 0   Entered and Authorized by:   Lynne Leader MD   Signed by:   Lynne Leader MD on 01/05/2010   Method used:   Print then Give to Patient   RxID:   [id number] HYDROCHLOROTHIAZIDE 25 MG TABS (HYDROCHLOROTHIAZIDE) take 1 pill by mouth daily  #30 x 12   Entered and Authorized by:   Lynne Leader MD   Signed by:   Lynne Leader MD on 01/05/2010   Method used:   Print then Give to Patient   RxID:   [id number] ZOLOFT 100 MG TABS (SERTRALINE HCL)  1 by mouth daily  #30 x 12   Entered and Authorized by:   Lynne Leader MD   Signed by:   Lynne Leader MD on 01/05/2010   Method used:   Print then Give to Patient   RxID:   [id number]    Prevention & Chronic Care Immunizations   Influenza vaccine: Not documented   Influenza vaccine deferral: Deferred  (10/14/2009)    Tetanus booster: Not documented   Td booster deferral: Deferred  (10/14/2009)    Pneumococcal vaccine: Not documented  Other Screening   Smoking status: never  (01/05/2010)  Lipids   Total Cholesterol: 190  (10/02/2009)   LDL: 130  (10/02/2009)   LDL Direct: Not documented   HDL: 35  (10/02/2009)   Triglycerides: 126  (10/02/2009)  Hypertension   Last Blood Pressure: 117 / 81  (01/05/2010)   Serum creatinine: 1.15  (10/02/2009)   Serum potassium 3.6  (10/02/2009)    Hypertension flowsheet reviewed?: Yes   Progress toward BP goal: At goal  Self-Management Support :   Personal Goals (by the next clinic visit) :      Personal blood pressure goal: 140/90  (10/14/2009)   Patient will work on the  following items until the next clinic visit to reach self-care goals:     Medications and monitoring: take my medicines every day, bring all of my medications to every visit, weigh myself weekly  (01/05/2010)    Hypertension self-management support: Not documented  Laboratory Results   Urine Tests  Date/Time Received: January 05, 2010 1:42 PM  Date/Time Reported: January 05, 2010 2:02 PM   Routine Urinalysis   Color: lt. yellow Appearance: Clear Glucose: negative   (Normal Range: Negative) Bilirubin: negative   (Normal Range: Negative) Ketone: negative   (Normal Range: Negative) Spec. Gravity: 1.010   (Normal Range: 1.003-1.035) Blood: negative   (Normal Range: Negative) pH: 6.5   (Normal Range: 5.0-8.0) Protein: negative   (Normal Range: Negative) Urobilinogen: 0.2   (Normal Range: 0-1) Nitrite: negative   (Normal Range: Negative) Leukocyte Esterace: negative   (Normal Range: Negative)    Comments: ...............test performed by......Marland KitchenBonnie A. Martinique, MLS (ASCP)cm

## 2010-11-10 NOTE — Assessment & Plan Note (Signed)
Summary: fu/kh   Vital Signs:  Patient profile:   50 year old male Weight:      163.8 pounds Temp:     97.9 degrees F oral Pulse rate:   80 / minute Pulse rhythm:   regular BP sitting:   133 / 86  (left arm) Cuff size:   regular  Vitals Entered By: Audelia Hives CMA (Feb 12, 2010 3:29 PM)  Primary Care Provider:  Lynne Leader  CC:  f/u chronic issues.  History of Present Illness: Presyncope: Connor Solomon was seen last month for syncope while at work. His HCTZ was stoped at that time due to presumed medication induced hypotension.  Since then he has stoped taking his HCTZ and continued to have presyncopal events.  He was at work this week when he became "dizzy".  He denies vertigo or tinnitus. He felt like he was going to pass out but did not.  He felt his heart beating rapidly but denies any skipped beats chest pain, or dyspnea. He states that these episodes are not like his panic attacks.  Episodes like this have happend several times per week.  Wrist Pain: Connor Connor Solomon injured his left wrist at work.  Please see notes from 4/25.  He did not get a X-ray in the [employer].  He went to an urgent care clinic in [place] where he was noted to have old fractures but none new.  He contiunes to have wrist pain requiring a splint for pain management.  He denies any numbess in his hand.   Anxiety: Connor Connor Solomon notes that his anxiety has imporved in the last few weeks.  He is taking zoloft $RemoveBefor'100mg'bzSrudqzCfkB$  daily. He denies any further panic attacks. He contines to be mildly anxious about his daughter.  He "worries" more than he would like about her.  However overall he feels much better and is happy with how he is going.   Current Problems (verified): 1)  Syncope  (ICD-780.2) 2)  Insomnia  (ICD-780.52) 3)  Wrist Pain, Left  (ICD-719.43) 4)  Panic Disorder  (ICD-300.01) 5)  Hypertension, Benign Essential  (ICD-401.1)  Current Medications (verified): 1)  Zoloft 100 Mg Tabs (Sertraline Hcl) .Marland Kitchen.. 1 By Mouth  Daily  Allergies (verified): 1)  Hydrocodone  Past History:  Past Medical History: Last updated: 09/22/2009 Left hand and wrist fractures that require surgury.  However patient cannot afford surgury at this time.  He wears a brace and is able to function at work.  HTN SBP 160s in 09/2009 Panic Disorder Dental caries with tooth fractures.  Hx of shingles in the past.   Past Surgical History: Last updated: 01/05/2010 All teeth removed 12/2009.  Awaiting dentures.  Family History: Last updated: 09/22/2009 Father with MI at 60 yo Brother with brain tumor Multiple males with prostate cancer  Social History: Last updated: 09/22/2009 Lives in [place] with girlfried. Works part time at a [employer].  No tobacco, alcahol or drugs.  Has a pain contract currently on 09/2009 due to patient with history of multiple ED visits for dental pain prior to establishing as a patient.   Risk Factors: Smoking Status: never (01/05/2010)  Review of Systems       The patient complains of syncope.  The patient denies anorexia, fever, weight loss, weight gain, vision loss, decreased hearing, hoarseness, chest pain, dyspnea on exertion, peripheral edema, prolonged cough, headaches, hemoptysis, abdominal pain, melena, hematochezia, severe indigestion/heartburn, hematuria, incontinence, genital sores, muscle weakness, suspicious skin lesions, transient blindness,  difficulty walking, depression, unusual weight change, abnormal bleeding, enlarged lymph nodes, angioedema, and testicular masses.    Physical Exam  General:  Vs noted. Well male in NAD Lungs:  Normal respiratory effort, chest expands symmetrically. Lungs are clear to auscultation, no crackles or wheezes. Heart:  Normal rate and regular rhythm. S1 and S2 normal without gallop, murmur, click, rub or other extra sounds. Abdomen:  Bowel sounds positive,abdomen soft and non-tender without masses, organomegaly or hernias noted. Msk:  R  wrist - normal  L wrist - mild swelling at radiocarpal joint; no bony abnormality. No specific snuff box tenderness. ROM limited in all dimensions, but especially so with flexion and lateral/mediali deviation. Grip strength 4-/5. Neurovascularly intact.  Extremities:  No clubbing, cyanosis, edema, or deformity noted with normal full range of motion of all joints.   Psych:  Oriented X3, memory intact for recent and remote, normally interactive, good eye contact, not anxious appearing, not depressed appearing, not agitated, not suicidal, and not homicidal.     Impression & Recommendations:  Problem # 1:  SYNCOPE (ICD-780.2) Assessment Unchanged DDx for his presyncope could be cardiogenic, anxiety related, or vasovagal.  Plan to evaluate for arrythemias with an event monitor and cardiology referral.  Will work up other items on the DDx if the events monitor fails to capture an arrythemia during an event.  His BP is stable during today's visit.  EKG at prior visit was normal.   Orders: Cardiology Referral (Cardiology) [facility]- Est  Level 4 (72094)  Problem # 2:  WRIST PAIN, LEFT (ICD-719.43) Assessment: Unchanged Wrist pain today is unchanged from prior visit. He requires a brace for functionality. He has an existing chronic wrist pain issue that was exerbated by the acute issue. Plan for orhtopeic referral as he has an acute issue and now has the community access card.  Will follow up reccs.   Orders: Orthopedic Referral (Ortho) [place]- Est  Level 4 (70962)  Problem # 3:  PANIC DISORDER (ICD-300.01) Assessment: Improved  Connor Astrid Drafts anxiety is much improved during the last month. Plan to continue the current therapy. Will follow up with other issues.   His updated medication list for this problem includes:    Zoloft 100 Mg Tabs (Sertraline hcl) .Marland Kitchen... 1 by mouth daily  Orders: [place]- Est  Level 4 (99214)  Complete Medication List: 1)  Zoloft 100 Mg Tabs (Sertraline hcl) .Marland Kitchen.. 1 by mouth  daily  Patient Instructions: 1)  Thank you for seeing me today. 2)  Please see the heart doctor and the wrist doctor.  3)  If you feel really bad or really anxious please come back. 4)  Please schedule a follow-up appointment in 1-2 months.  5)  Only take the zoloft Prescriptions: ZOLOFT 100 MG TABS (SERTRALINE HCL) 1 by mouth daily  #30 x 12   Entered and Authorized by:   Lynne Leader MD   Signed by:   Lynne Leader MD on 02/12/2010   Method used:   Print then Give to Patient   RxID:   [id number]

## 2010-11-10 NOTE — Assessment & Plan Note (Signed)
Summary: wrist pain x2 days/Avinger/Hilmer Aliberti   Vital Signs:  Patient profile:   50 year old male Height:      66.5 inches Weight:      162 pounds BMI:     25.85 Temp:     98.1 degrees F oral Pulse rate:   79 / minute BP sitting:   107 / 71  (right arm) Cuff size:   regular  Vitals Entered By: Schuyler Amor CMA (Feb 23, 2010 10:32 AM) CC: left wrist pain Is Patient Diabetic? No Pain Assessment Patient in pain? yes     Location: left wrist Intensity: 10   Primary Care Provider:  Lynne Leader  CC:  left wrist pain.  History of Present Illness: JOINT PAIN Location: Left Wrist Description: 10/10 pain with extension and flexion.   Onset:  Year, but exerbated pain in last few weeks Modifying factors: Wrist "locked up" a few days ago and had to work to regain neutral position.   Symptoms Swelling: Yes Redness: No Weakness: Yes Locking: Yes  Red Flags Fever: No Multiple joints: No Rash: No STD exposure: No  Has tried NSAIDS Ibuprofen $RemoveBeforeDE'400mg'ySBzWDyFvyinMVJ$  q3 and tylenol without much help. Is using ice, rest, elevation and a brace.  Has not been able to get into any ortho group in town.  He request percocets by name and notes that NSAIDs and tramadol do not work well for his pain.  Habits & Providers  Alcohol-Tobacco-Diet     Tobacco Status: never  Current Problems (verified): 1)  Syncope  (ICD-780.2) 2)  Insomnia  (ICD-780.52) 3)  Wrist Pain, Left  (ICD-719.43) 4)  Panic Disorder  (ICD-300.01) 5)  Hypertension, Benign Essential  (ICD-401.1)  Current Medications (verified): 1)  Zoloft 100 Mg Tabs (Sertraline Hcl) .Marland Kitchen.. 1 By Mouth Daily 2)  Diclofenac Sodium 50 Mg Tbec (Diclofenac Sodium) .Marland Kitchen.. 1 By Mouth Tid 3)  Percocet 5-325 Mg Tabs (Oxycodone-Acetaminophen) .Marland Kitchen.. 1 By Mouth Q6 Hrs As Needed Pain  Allergies (verified): 1)  Hydrocodone  Past History:  Past Medical History: Last updated: 09/22/2009 Left hand and wrist fractures that require surgury.  However patient cannot afford  surgury at this time.  He wears a brace and is able to function at work.  HTN SBP 160s in 09/2009 Panic Disorder Dental caries with tooth fractures.  Hx of shingles in the past.   Past Surgical History: Last updated: 01/05/2010 All teeth removed 12/2009.  Awaiting dentures.  Family History: Last updated: 09/22/2009 Father with MI at 85 yo Brother with brain tumor Multiple males with prostate cancer  Social History: Last updated: 09/22/2009 Lives in [place] with girlfried. Works part time at a [employer].  No tobacco, alcahol or drugs.  Has a pain contract currently on 09/2009 due to patient with history of multiple ED visits for dental pain prior to establishing as a patient.   Risk Factors: Smoking Status: never (02/23/2010)  Review of Systems  The patient denies anorexia, fever, weight loss, weight gain, vision loss, decreased hearing, hoarseness, chest pain, syncope, dyspnea on exertion, peripheral edema, prolonged cough, headaches, hemoptysis, abdominal pain, melena, hematochezia, severe indigestion/heartburn, hematuria, incontinence, genital sores, muscle weakness, suspicious skin lesions, transient blindness, difficulty walking, depression, unusual weight change, abnormal bleeding, enlarged lymph nodes, angioedema, and testicular masses.    Physical Exam  General:  VS noted. Well male in pain holding left arm Mouth:  Oral mucosa and oropharynx without lesions or exudates.  Teeth in good repair. Lungs:  Normal respiratory effort, chest expands symmetrically.  Lungs are clear to auscultation, no crackles or wheezes. Heart:  Normal rate and regular rhythm. S1 and S2 normal without gallop, murmur, click, rub or other extra sounds. Msk:  R wrist - normal  L wrist - mild swelling at radiocarpal joint; no bony abnormality. No specific snuff box tenderness. ROM limited in all dimensions, but especially so with flexion and lateral/mediali deviation. Grip strength 4-/5.  Neurovascularly intact.    Impression & Recommendations:  Problem # 1:  WRIST PAIN, LEFT (ICD-719.43) Assessment Deteriorated  Was unabel to get into an ortho group in [place].  Plan: Diclofinac, and 1 month of percocets only. Will also use omeprazole OTC for ulcer prophylaxis.  Also attempt to get Mr Goodell into the [facility] resident clinic as he cannot get into the [place] groups.   Will follow up as needed.  Orders: Orthopedic Referral (Ortho) [place]- Est Level  3 ([address]371)  Complete Medication List: 1)  Zoloft 100 Mg Tabs (Sertraline hcl) .Marland Kitchen.. 1 by mouth daily 2)  Diclofenac Sodium 50 Mg Tbec (Diclofenac sodium) .Marland Kitchen.. 1 by mouth tid 3)  Percocet 5-325 Mg Tabs (Oxycodone-acetaminophen) .Marland Kitchen.. 1 by mouth q6 hrs as needed pain  Patient Instructions: 1)  Thank you for seeing me today. 2)  Stop taking ibuprofen/advil/motrin 3)  Start taking dicofinac twice a day 4)  Get some OTC omeprazole $RemoveBefore'20mg'pcCzQFDThBZNZ$  and take it daily.  5)  Herbie Baltimore will help you set up the ortho appointment. Prescriptions: PERCOCET 5-325 MG TABS (OXYCODONE-ACETAMINOPHEN) 1 by mouth q6 hrs as needed pain  #30 x 0   Entered and Authorized by:   Lynne Leader MD   Signed by:   Lynne Leader MD on 02/23/2010   Method used:   Print then Give to Patient   RxID:   [id number] DICLOFENAC SODIUM 50 MG TBEC (DICLOFENAC SODIUM) 1 by mouth tid  #90 x 0   Entered and Authorized by:   Lynne Leader MD   Signed by:   Lynne Leader MD on 02/23/2010   Method used:   Print then Give to Patient   RxID:   [id number]    Prevention & Chronic Care Immunizations   Influenza vaccine: Not documented   Influenza vaccine deferral: Not available  (02/23/2010)    Tetanus booster: Not documented   Td booster deferral: Deferred  (10/14/2009)    Pneumococcal vaccine: Not documented  Other Screening   Smoking status: never  (02/23/2010)  Lipids   Total Cholesterol: 190  (10/02/2009)   LDL: 130  (10/02/2009)   LDL Direct: Not documented    HDL: 35  (10/02/2009)   Triglycerides: 126  (10/02/2009)  Hypertension   Last Blood Pressure: 107 / 71  (02/23/2010)   Serum creatinine: 0.97  (01/12/2010)   Serum potassium 3.5  (01/12/2010)    Hypertension flowsheet reviewed?: Yes   Progress toward BP goal: At goal  Self-Management Support :   Personal Goals (by the next clinic visit) :      Personal blood pressure goal: 140/90  (10/14/2009)   Patient will work on the following items until the next clinic visit to reach self-care goals:     Medications and monitoring: take my medicines every day, check my blood pressure, bring all of my medications to every visit  (02/23/2010)    Hypertension self-management support: Not documented

## 2010-11-10 NOTE — Progress Notes (Signed)
Summary: needs meds  Phone Note Call from Patient Call back at Home Phone [phone]   Caller: Patient Summary of Call: been having teeth pulled - is on percocet and is now out and is hurting - wants to know if he can have a few to tide him over a few days [place] Initial call taken by: Audie Clear,  December 17, 2009 8:57 AM  Follow-up for Phone Call        had the last of them pulled yesterday. dentist would not give percocet, only vicodin which he declined. currently taking advil. told him to continue but have food in stoamch when he takes it. c/o difficulty eating due to swelling & pain. advised ice cream or freeze pops to help keep hydrated & the cold will reduce the swelling & pain. told him I will send messge to pcp & will call him when I have a response. pcp is in hospital this week. given to preceptors Follow-up by: Elige Radon RN,  December 17, 2009 9:06 AM  Additional Follow-up for Phone Call Additional follow up Details #1::        SInce pt had last of teeth pulled already, his pain should be improving. He received 30 percocets on 12/08/09.  He should continue with the ibuprofen.  If this is inadaquate, we should probably evaluate him for his severe pain.  Additional Follow-up by: Sarah Martinique MD,  December 17, 2009 9:37 AM    Additional Follow-up for Phone Call Additional follow up Details #2::    he took $Remov'400mg'OuVngb$  ibuprofen this am. does nopt think that will be adequate. wants to see a doctor & get something stronger. work in at 1:30. aware there will be a wait Follow-up by: Elige Radon RN,  December 17, 2009 9:54 AM

## 2010-11-10 NOTE — Progress Notes (Signed)
Summary: Emergency Line Call: Pain med  Phone Note Outgoing Call   Call placed to: Patient Summary of Call: Pt was at pharmacy to pick up the pain medicine that Dr Ernestina Patches.  He was told by pharmacist that Nortriptyline is for treatment of anxiety.  He would like to know why he is Rx another anxiety med.  Explained to him that Dr Ernestina Patches thought that he pain he was having was neuropathic so that was why he rx Nortriptyline.  Advised pt to take the meds as prescribed by Dr Ernestina Patches. Pt agreed.  Initial call taken by: Hence Derrick MD,  April 14, 2010 5:30 PM

## 2010-11-10 NOTE — Assessment & Plan Note (Signed)
Summary: ear pain worse/[place]/corey   Vital Signs:  Patient profile:   50 year old male Height:      66 inches Weight:      170 pounds BMI:     27.54 Temp:     98.1 degrees F oral Pulse rate:   97 / minute BP sitting:   129 / 77  (left arm) Cuff size:   regular  Vitals Entered By: Levert Feinstein LPN (April 15, 1609 9:60 PM) CC: ear pain worse Is Patient Diabetic? No Pain Assessment Patient in pain? yes     Location: ears   Primary Care Provider:  Lynne Leader  CC:  ear pain worse.  History of Present Illness: 1. Bilateral Ear / Jaw Pain: Has been there for several weeks.  c/o bilateral jaw pain when he chews and yawns, it is an ache that becomes sharp when he opens his jaw wide, and with a slight "popping feeling." the pain does affect his sleep.  He has been to the clinic 3-4 times for this same complaint and everything that we have given him hasn't helped.  He has tried Tramadol, Mobic, Nortryptiline.  He is getting frustrated because nothing that we seem to be doing is helping.      ROS: denies headach, sore throat, fevers, hearing problems, problems with balance, sinus pain /                pressure.   Habits & Providers  Alcohol-Tobacco-Diet     Tobacco Status: never  Current Medications (verified): 1)  Zoloft 100 Mg Tabs (Sertraline Hcl) .Marland Kitchen.. 1 By Mouth Daily 2)  Sumatriptan Succinate 25 Mg Tabs (Sumatriptan Succinate) .... Take 1 Tab At The Onset of Headache.  May Repeat Times 1 in 2 Hours If No Relief. 3)  Tramadol Hcl 50 Mg Tabs (Tramadol Hcl) .... Take 1 Tab By Mouth Every 6 Hours As Needed For Pain 4)  Mobic 7.5 Mg Tabs (Meloxicam) .... One To Two By Mouth Daily As Needed For Pain and Inflammation 5)  Nortriptyline Hcl 50 Mg Caps (Nortriptyline Hcl) .... One By Mouth Each Night 6)  Percocet 5-325 Mg Tabs (Oxycodone-Acetaminophen) .Marland Kitchen.. 1 Tab Twice A Day As Needed For Pain 7)  Antipyrine-Benzocaine 5.4-1.4 % Soln (Benzocaine-Antipyrine) .... 4 Drops Instilled in Each Ear  3-4 Times/ Day 8)  Debrox 6.5 % Soln (Carbamide Peroxide) .... 4 Drops in Right Ear 3-4 Times / Day To Help Get Rid of Ear Wax  Allergies: 1)  Hydrocodone  Past History:  Past Medical History: Reviewed history from 03/02/2010 and no changes required. L hand injury Syncope HTN SBP 160s in 09/2009 Panic Disorder Dental caries with tooth fractures.  Hx of shingles in the past.   Social History: Reviewed history from 09/22/2009 and no changes required. Lives in [place] with girlfried. Works part time at a [employer].  No tobacco, alcahol or drugs.  Has a pain contract currently on 09/2009 due to patient with history of multiple ED visits for dental pain prior to establishing as a patient.   Physical Exam  General:  Well-developed,well-nourished, in no acute distress; alert, appropriate and cooperative throughout examination. Vitals reviewed. Head:  Normocephalic and atraumatic without obvious abnormalities.  Eyes:  No corneal or conjunctival inflammation noted. EOMI. Perrla.Vision grossly normal. Ears:  R ear canal blocked with cerumen.  Left TM well visualized with good landmarks and no bulging or effusion Nose:  no external deformity and no nasal discharge.   Mouth:  Oral mucosa and oropharynx without lesions or exudates.  Tenderness to palpation at bilateral TMJs. Decreased incisal opening 2/2 pain. No click appreciated bilaterally.  Pain reproduced with jaw movement. Neck:  No deformities, masses, or tenderness noted. Lungs:  Normal respiratory effort, chest expands symmetrically. Lungs are clear to auscultation, no crackles or wheezes. Heart:  Normal rate and regular rhythm. S1 and S2 normal without gallop, murmur, click, rub or other extra sounds.   Impression & Recommendations:  Problem # 1:  TEMPOROMANDIBULAR JOINT PAIN (ICD-524.62) Assessment Unchanged  Still present and not getting better after 3 weeks.  He has been on multiple different pain medications  without relief.  Unsure how severe his pain is since he has a long standing hx of pain complaints.  However, will treat with Percocent (x5) to knock out the pain and continue other pain medicines.  Also prescribed some ear drops to see if that would benefit pt.  Orders: [place]- Est Level  3 (40814)  Complete Medication List: 1)  Zoloft 100 Mg Tabs (Sertraline hcl) .Marland Kitchen.. 1 by mouth daily 2)  Sumatriptan Succinate 25 Mg Tabs (Sumatriptan succinate) .... Take 1 tab at the onset of headache.  may repeat times 1 in 2 hours if no relief. 3)  Tramadol Hcl 50 Mg Tabs (Tramadol hcl) .... Take 1 tab by mouth every 6 hours as needed for pain 4)  Mobic 7.5 Mg Tabs (Meloxicam) .... One to two by mouth daily as needed for pain and inflammation 5)  Nortriptyline Hcl 50 Mg Caps (Nortriptyline hcl) .... One by mouth each night 6)  Percocet 5-325 Mg Tabs (Oxycodone-acetaminophen) .Marland Kitchen.. 1 tab twice a day as needed for pain 7)  Antipyrine-benzocaine 5.4-1.4 % Soln (Benzocaine-antipyrine) .... 4 drops instilled in each ear 3-4 times/ day 8)  Debrox 6.5 % Soln (Carbamide peroxide) .... 4 drops in right ear 3-4 times / day to help get rid of ear wax  Patient Instructions: 1)  We will try the Percocet to knock out the pain 2)  Continue your other medicines to keep it from coming back 3)  I have also prescribed some ear drops to see if they will help with the pain Prescriptions: DEBROX 6.5 % SOLN (CARBAMIDE PEROXIDE) 4 drops in right ear 3-4 times / day to help get rid of ear wax  #1 x 3   Entered and Authorized by:   Mylinda Latina MD   Signed by:   Mylinda Latina MD on 04/15/2010   Method used:   Print then Give to Patient   RxID:   [id number] ANTIPYRINE-BENZOCAINE 5.4-1.4 % SOLN (BENZOCAINE-ANTIPYRINE) 4 drops instilled in each ear 3-4 times/ day  #1 x 1   Entered and Authorized by:   Mylinda Latina MD   Signed by:   Mylinda Latina MD on 04/15/2010   Method used:   Print then Give to Patient   RxID:    [id number] PERCOCET 5-325 MG TABS (OXYCODONE-ACETAMINOPHEN) 1 tab twice a day as needed for pain  #5 x 0   Entered and Authorized by:   Mylinda Latina MD   Signed by:   Mylinda Latina MD on 04/15/2010   Method used:   Print then Give to Patient   RxID:   [id number]

## 2010-11-10 NOTE — Assessment & Plan Note (Signed)
Summary: earache,tcb   Vital Signs:  Patient profile:   50 year old male Weight:      170.9 pounds Temp:     98.3 degrees F oral Pulse rate:   87 / minute Pulse rhythm:   regular BP sitting:   144 / 93  (left arm)  Vitals Entered By: Loralee Pacas CMA (April 14, 2010 2:11 PM) Comments bilateral ear pain he has been suffering with this for a while. pt stated that he was given something for it but has not helped.  his left ear is hurting him more now than his right.   Primary Care Provider:  Clementeen Graham   History of Present Illness: 50 YOM  w/ 2-3 month hx/o bilateral ear and jaw pain s/p tooth extraction. Pt reports pain worse at night. No fevers, rash, odynophagia or other systemic sxs. No sinusitis type sxs.  Pain most prominent at night. Recently seen by Dr. Vella Redhead; dxd w/ TMJ syndrome. Pt reports minimal relief w/ nortryptyline or NSAIDs. Exercises not very effective. Pt states pain most prominent at night. Pt states that only percocet in past has been able to help.   Allergies: 1)  Hydrocodone  Physical Exam  General:  alert.  in minimal distress secondary to jaw pain  Head:  NCAT Eyes:  EOMI  Ears:  TM clear on L side. R ear canal w/ large amount of cerumen  Nose:  no external erythema and no nasal discharge.   Mouth:  edentuluos. No palate deviation or post oropharyngeal exudated.  Neck:  + tenderness to palpation over TM areas bilaterally, L>R. no LAD Lungs:  CTAB  Heart:  RRR Abdomen:  S/NT/ND/+bowel sounds Neurologic:  alert & oriented X3 and cranial nerves II-XII intact.     Impression & Recommendations:  Problem # 1:  TEMPOROMANDIBULAR JOINT PAIN (ICD-524.62) Plan to place pt on neurontin for likely nerve mediated pain s/p tooth extraction. Pt instructed to continue jaw exercises, mechanical soft diet,continue NSAIDs, and continue nortryptyline for synergistic effect. Will followup in 1 month for reasessment of pain. Will hold on narcotic medication. Red flags  reviewed w/ pt for return parameters. Pt overall agreeable to plan.  Orders: [facility]- Est Level  3 (59172)  Complete Medication List: 1)  Zoloft 100 Mg Tabs (Sertraline hcl) .Marland Kitchen.. 1 by mouth daily 2)  Sumatriptan Succinate 25 Mg Tabs (Sumatriptan succinate) .... Take 1 tab at the onset of headache.  may repeat times 1 in 2 hours if no relief. 3)  Tramadol Hcl 50 Mg Tabs (Tramadol hcl) .... Take 1 tab by mouth every 6 hours as needed for pain 4)  Mobic 7.5 Mg Tabs (Meloxicam) .... One to two by mouth daily as needed for pain and inflammation 5)  Nortriptyline Hcl 50 Mg Caps (Nortriptyline hcl) .... One by mouth each night 6)  Percocet 5-325 Mg Tabs (Oxycodone-acetaminophen) .Marland Kitchen.. 1 tab twice a day as needed for pain 7)  Antipyrine-benzocaine 5.4-1.4 % Soln (Benzocaine-antipyrine) .... 4 drops instilled in each ear 3-4 times/ day 8)  Debrox 6.5 % Soln (Carbamide peroxide) .... 4 drops in right ear 3-4 times / day to help get rid of ear wax  Patient Instructions: 1)  It was a pleasure meeting you today. 2)  I am recommending neurontin for your jaw pain in addition to the nortryptyline daiily  3)  Continure taking the NSAIDs as this will help w/ inflammation 4)  Come back to see me in 4 weeks to reevaluate your jaw pain  5)  If you start having fever, severe jaw pain, trouble swallowing, or severe jaw swelling, return to clinic to be reevaluated.  6)  If you have any questions feel free to give [place] a call at the [facility] 7)  God Bless, 8)  Shanda Howells MD

## 2010-11-10 NOTE — Progress Notes (Signed)
Summary: triage  Phone Note Call from Patient   Caller: Patient Summary of Call: had a h/a for the last 2 days and feels faint Initial call taken by: Audie Clear,  March 11, 2010 8:35 AM  Follow-up for Phone Call        c/o HA . pain is "real bad"  9/10. dizzy & nauseous. states it hurts to talk. feels he cannot drive. states he "never" gets HAs. sees heart md at [facility] cardiac. did not know name of md.  no meds yet. states he has HTN.  suggestd ED. states they "never do nothing".  asked him to call wife & see if she can get off work & bring him here asap. states he will call & ask. he will call me back. meantime made appt for 3pm if unable to come in now Follow-up by: Elige Radon RN,  March 11, 2010 8:36 AM

## 2010-11-10 NOTE — Assessment & Plan Note (Signed)
Summary: fx open fx L arm/[place]/Connor Solomon   Vital Signs:  Patient profile:   50 year old male Weight:      167.5 pounds Temp:     97.8 degrees F oral Pulse rate:   76 / minute BP sitting:   147 / 84  (right arm) Cuff size:   regular  Vitals Entered By: Audelia Hives CMA (February 02, 2010 8:49 AM) CC: left wrist fracture Pain Assessment Patient in pain? yes      Intensity: 10 Type: sharp Onset of pain  Chronic Comments fractured left wrist Thrusday while at work using a drill.  went [facility] thursday   Primary Care Provider:  Lynne Leader  CC:  left wrist fracture.  History of Present Illness: 1. 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 States that he lost control of it and it twist away from him, his left hand was gripping the machine and was caught in the twisting motion as well, wrenching his wrist in the process. Noted immediate pain and swelling. Was seen at Urgent Care where no imaging was done, splint was made; pt was instructed to follow-up here on Friday for a referral to ortho, however, we were closed. No [facility] notes in our system. Since that time pt has reported persistent pain. Swelling has improved with ice and ibuprofen. Has very limited ROM and pain with almost any movement. Out of percocet and states that this is the only thing that helps the pain.  Current Medications (verified): 1)  Zoloft 100 Mg Tabs (Sertraline Hcl) .Marland Kitchen.. 1 By Mouth Daily 2)  Hydrochlorothiazide 25 Mg Tabs (Hydrochlorothiazide) .... Take 1 Pill By Mouth Daily 3)  Naprosyn 500 Mg Tabs (Naproxen) .... Take One By Mouth Two Times A Day X 10 Days Then As Needed 4)  Percocet 5-325 Mg Tabs (Oxycodone-Acetaminophen) .Marland Kitchen.. 1-2 By Mouth Q4 Hrs As Needed Pain  Allergies (verified): 1)  Hydrocodone  Review of Systems       ROS negative except as noted in hpi.   Physical Exam  General:  vital signs reviewed; mildly hypertensive  Alert, appropriate;  well-dressed and well-nourished; not in acute distress.   Msk:  R wrist - normal  L wrist - mild swelling at radiocarpal joint; no bony abnormality. No specific snuff box tenderness. ROM limited in all dimensions, but especially so with flexion and lateral/mediali deviation. Grip strength 4-/5. Neurovascularly intact.    Impression & Recommendations:  Problem # 1:  WRIST PAIN, LEFT (OXB-353.29) Assessment Deteriorated Has had chronic L wrist pain. Likely sprain but will obtain films. Not much swelling but we are 4 days out. Percocet #20 given. follow-up appointment advised in 2 days.  Orders: Radiology other (Radiology Other) [facility]- Est Level  3 ([id number])  Complete Medication List: 1)  Zoloft 100 Mg Tabs (Sertraline hcl) .Marland Kitchen.. 1 by mouth daily 2)  Hydrochlorothiazide 25 Mg Tabs (Hydrochlorothiazide) .... Take 1 pill by mouth daily 3)  Naprosyn 500 Mg Tabs (Naproxen) .... Take one by mouth two times a day x 10 days then as needed 4)  Percocet 5-325 Mg Tabs (Oxycodone-acetaminophen) .Marland Kitchen.. 1-2 by mouth q4 hrs as needed pain  Patient Instructions: 1)  take the pain medicine as needed 2)  continue to keep it wrapped 3)  Ice it 2-3 times per day for 20 minutes 4)  take the ibuprofen to help with swelling, pain, and inflammation 5)  we'll get an xray today 6)  schedule a follow-up  appointment here in 2 days Prescriptions: PERCOCET 5-325 MG TABS (OXYCODONE-ACETAMINOPHEN) 1-2 by mouth q4 hrs as needed pain  #20 x 0   Entered by:   Audelia Hives CMA   Authorized by:   Eugenie Norrie  MD   Signed by:   Audelia Hives CMA on 02/02/2010   Method used:   Print then Give to Patient   RxID:   [id number]   Appended Document: fx open fx L arm/[place]/Connor Solomon Correction: pt visit to [facility] urgent care found for 01/30/10. Given 10 percocet at that visit. Will await xray results.

## 2010-11-10 NOTE — Letter (Signed)
Summary: Out of Work  [place]  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         Sincerely,    Eugenie Norrie, MD

## 2010-11-10 NOTE — Assessment & Plan Note (Signed)
Summary: F/U VISIT/[facility]   Vital Signs:  Patient profile:   50 year old male Weight:      162 pounds Temp:     98.2 degrees F oral Pulse rate:   79 / minute BP sitting:   120 / 81  (left arm) Cuff size:   regular  Vitals Entered By: Schuyler Amor CMA (October 14, 2009 2:29 PM) CC: F/U anxiety and toothache Is Patient Diabetic? No Pain Assessment Patient in pain? yes     Location: tooth Intensity: 10   Primary Care Provider:  Lynne Leader  CC:  F/U anxiety and toothache.  History of Present Illness: Anxiety: Pt continued to have anxiety attacks this last month.  He did have one so bad that he felt as though he would die.  He presented to the ED and had CE run and a stress ECHO that was WNL.  He has been taking his zoloft and has completed his klonipin.  He does note that he fears crowds and to go out because he is afraid of having another attack.  He wants to feel better.  Tooth Pain: Ms Plancarte ran out of percocets this month. His tooth pain has been extreme.  He has called around to various dentists and negotiated a rate for dental care.  He will be seen on Jan 28th.  He denies any fever, chills, trouble breathing or swallowing. His pain is severe.   HTN: His blood pressure has been well controlled.  He has been taking his BP medications daily.   Of note he did work with Bonna Gains and managed to get the orange card.  Habits & Providers  Alcohol-Tobacco-Diet     Tobacco Status: never  Current Medications (verified): 1)  Percocet 5-325 Mg Tabs (Oxycodone-Acetaminophen) .... Take 1-2 Pills By Mouth Q 4-6 Hrs As Needed Pain 2)  Penicillin V Potassium 500 Mg Tabs (Penicillin V Potassium) .Marland Kitchen.. 1 Tab By Mouth Two Times A Day Untill Tooth Extraction 3)  Klonopin 0.5 Mg Tabs (Clonazepam) .... Take 1 For Panic Attack Prn 4)  Zoloft 50 Mg Tabs (Sertraline Hcl) .... Take 1 Per Day For 1 Week Then $RemoveB'75mg'IYUcbYvK$  Per Day For 1 Week Then 2 Per Day ($Remove'100mg'ZfbSHpn$ ) Until You See Me. 5)  Hydrochlorothiazide  25 Mg Tabs (Hydrochlorothiazide) .... Take 1 Pill By Mouth Daily  Allergies (verified): 1)  Hydrocodone  Past History:  Past Medical History: Last updated: 09/22/2009 Left hand and wrist fractures that require surgury.  However patient cannot afford surgury at this time.  He wears a brace and is able to function at work.  HTN SBP 160s in 09/2009 Panic Disorder Dental caries with tooth fractures.  Hx of shingles in the past.   Family History: Last updated: 09/22/2009 Father with MI at 71 yo Brother with brain tumor Multiple males with prostate cancer  Social History: Last updated: 09/22/2009 Lives in [place] with girlfried. Works part time at a [employer].  No tobacco, alcahol or drugs.  Has a pain contract currently on 09/2009 due to patient with history of multiple ED visits for dental pain prior to establishing as a patient.   Risk Factors: Smoking Status: never (10/14/2009)  Social History: Smoking Status:  never  Review of Systems       Please see HPI otherwise negative.  Physical Exam  General:  VS noted. Well appearing male in NAD Mouth:  Multiple missing teeth with multiple caries.   Neck:  No deformities, masses, or tenderness noted.  Lungs:  Normal respiratory effort, chest expands symmetrically. Lungs are clear to auscultation, no crackles or wheezes. Heart:  Normal rate and regular rhythm. S1 and S2 normal without gallop, murmur, click, rub or other extra sounds. Abdomen:  Non distended, NABS, soft, nontender, no guarding or rebound.   Psych:  Oriented X3, memory intact for recent and remote, normally interactive, good eye contact, not agitated, not suicidal, not homicidal, tearful, and slightly anxious.     Impression & Recommendations:  Problem # 1:  DENTAL CARIES OF ROOT SURFACE (ICD-521.08) Assessment Unchanged  Pt continues to have severe mouth pain.  He is taking on avaerage a percocet every 6 hours.  He has an appointment with  dentists on Jan 28th. Plan:  Will provide 25 days of percocets (4 per day =100 tabs).   Have a pain contract.  Will refill only with a police report (if stolen), or he goes to the dentist.  Plan also to continue the penicillin to prevent abscess and reduce inflimation.  Also reccomend dental wax for sensitivity.  Orders: [facility]- Est  Level 4 (99214)  Problem # 2:  PANIC DISORDER (ICD-300.01) Assessment: Deteriorated  Has worsened.   Plan increase Zoloft to $RemoveB'100mg'kaeXeOkd$  by mouth once daily by $Remove'25mg'JHnjvpM$  per week.  Will also provide 30 tablets of klonipin.  Discussed short term nature of klonipin. His updated medication list for this problem includes:    Klonopin 0.5 Mg Tabs (Clonazepam) .Marland Kitchen... Take 1 for panic attack prn    Zoloft 50 Mg Tabs (Sertraline hcl) .Marland Kitchen... Take 1 per day for 1 week then $RemoveB'75mg'YiBlVdjb$  per day for 1 week then 2 per day ($Remove'100mg'wbKokfN$ ) until you see me.  Orders: [facility]- Est  Level 4 ([address])  Problem # 3:  HYPERTENSION, BENIGN ESSENTIAL (ICD-401.1) Assessment: Improved  Well controlled plan to continue HCTZ. His updated medication list for this problem includes:    Hydrochlorothiazide 25 Mg Tabs (Hydrochlorothiazide) .Marland Kitchen... Take 1 pill by mouth daily  Orders: [facility]- Est  Level 4 (99214)  Complete Medication List: 1)  Percocet 5-325 Mg Tabs (Oxycodone-acetaminophen) .... Take 1-2 pills by mouth q 4-6 hrs as needed pain 2)  Penicillin V Potassium 500 Mg Tabs (Penicillin v potassium) .Marland Kitchen.. 1 tab by mouth two times a day untill tooth extraction 3)  Klonopin 0.5 Mg Tabs (Clonazepam) .... Take 1 for panic attack prn 4)  Zoloft 50 Mg Tabs (Sertraline hcl) .... Take 1 per day for 1 week then $RemoveB'75mg'qvwTgMYG$  per day for 1 week then 2 per day ($Remove'100mg'TxeJlRu$ ) until you see me. 5)  Hydrochlorothiazide 25 Mg Tabs (Hydrochlorothiazide) .... Take 1 pill by mouth daily  Patient Instructions: 1)  Thank you for seeing me today.Marland Kitchen 2)  If you have chest pain, difficulty breathing, fevers over 102 that does not get better with tylenol  please call us or see a doctor.  3)  Please see a doctor if you want to hurt your self or others. 4)  Sertaline.  50 mg (1 new pill a day) for 1 week.  Then $Rem'75mg'mMZa$  (1 new pill and 1 old pill) for 1 week.  Then take 100 mg (2 new pills) a day until you see me. 5)  I have given you 100 percocets (4 per day).  Try to make it last.  Until you see the dentist. 6)  Try some dental wax on the broken teeth.  7)  Please see me in 4 weeks (after the 28th). 8)  Keep taking the blood pressure medicines.  Prescriptions:  KLONOPIN 0.5 MG TABS (CLONAZEPAM) take 1 for panic attack prn  #30 x 0   Entered and Authorized by:   Lynne Leader MD   Signed by:   Lynne Leader MD on 10/14/2009   Method used:   Print then Give to Patient   RxID:   [id number] ZOLOFT 50 MG TABS (SERTRALINE HCL) take 1 per day for 1 week then $RemoveB'75mg'jtvmctQI$  per day for 1 week then 2 per day ($Remove'100mg'MWCyRzF$ ) until you see me.  #60 x 5   Entered and Authorized by:   Lynne Leader MD   Signed by:   Lynne Leader MD on 10/14/2009   Method used:   Print then Give to Patient   RxID:   [id number] PERCOCET 5-325 MG TABS (OXYCODONE-ACETAMINOPHEN) take 1-2 pills by mouth q 4-6 hrs as needed pain  #100 x 0   Entered and Authorized by:   Lynne Leader MD   Signed by:   Lynne Leader MD on 10/14/2009   Method used:   Print then Give to Patient   RxID:   [id number]    Prevention & Chronic Care Immunizations   Influenza vaccine: Not documented   Influenza vaccine deferral: Deferred  (10/14/2009)    Tetanus booster: Not documented   Td booster deferral: Deferred  (10/14/2009)    Pneumococcal vaccine: Not documented  Other Screening   Smoking status: never  (10/14/2009)  Lipids   Total Cholesterol: 190  (10/02/2009)   LDL: 130  (10/02/2009)   LDL Direct: Not documented   HDL: 35  (10/02/2009)   Triglycerides: 126  (10/02/2009)  Hypertension   Last Blood Pressure: 120 / 81  (10/14/2009)   Serum creatinine: 1.15  (10/02/2009)   Serum potassium 3.6   (10/02/2009)    Hypertension flowsheet reviewed?: Yes   Progress toward BP goal: At goal  Self-Management Support :   Personal Goals (by the next clinic visit) :      Personal blood pressure goal: 140/90  (10/14/2009)   Patient will work on the following items until the next clinic visit to reach self-care goals:     Medications and monitoring: take my medicines every day, check my blood pressure, bring all of my medications to every visit  (10/14/2009)    Hypertension self-management support: Not documented

## 2010-11-10 NOTE — Progress Notes (Signed)
Summary: Meds  Phone Note Call from Patient Call back at Home Phone [phone]   Reason for Call: Talk to Nurse Summary of Call: pt was seen by a different MD re: his fall, wants to know if his pcp will be filling his meds? Initial call taken by: Samara Snide,  January 13, 2010 3:08 PM  Follow-up for Phone Call        called pt. pt needs refill of percocet. last rf 01-05-10 # 15. pt has f/up appt with dr.thekkekandam 01-19-10 and appt with pcp Josiah Nieto 02-12-10. fwd. to dr.Ginni Eichler for review. Follow-up by: Mauricia Area CMA,,  January 13, 2010 4:22 PM  Additional Follow-up for Phone Call Additional follow up Details #1::        Will not refill the percocet as he does not have kidney stones.  Additional Follow-up by: Lynne Leader MD,  January 14, 2010 2:04 PM

## 2010-11-10 NOTE — Letter (Signed)
Summary: Probation Letter  [facility]  [address]   [place], [place] [address]   Phone: [phone]  Fax: [phone]    04/15/2010  PRECILIANO CASTELL [address] [place], [place]  [address]  Dear Mr. WAFER,  With the goal of better serving all our patients the [facility] is following each patient's missed appointments.  You have missed at least 3 appointments with our practice.If you cannot keep your appointment, we expect you to call at least 24 hours before your appointment time.  Missing appointments prevents other patients from seeing [place] and makes it difficult to provide you with the best possible medical care.      1.   If you miss one more appointment, we will only give you limited medical services. This means we will not call in medication refills, complete a form, or make a referral for you except when you are here for a scheduled office visit.    2.   If you miss 2 or more appointments in the next year, we will dismiss you from our practice.    Our office staff can be reached at [phone] Monday through Friday from 8:30 a.m.-5:00 p.m. and will be glad to schedule your appointment as necessary.    Thank you.   The [facility] Document: Probation Letter mailed

## 2010-11-10 NOTE — Progress Notes (Signed)
Summary: meds prob  Phone Note Call from Patient Call back at Home Phone [phone]   Caller: Patient Summary of Call: Zoloft was not working and made it worse so he stopped taking it.  wants to get refill on Klonopin - helps a lot better [place]. Initial call taken by: Audie Clear,  October 27, 2009 11:20 AM  Follow-up for Phone Call        states the zoloft made him feel worse. his wife told him to stop taking the med. says he is very nervous, vomiting & losing weight. says the klonopin is the only thing that works. told him md will most likely want to see him to discuss change of meds. told him I will call back with md response Follow-up by: Elige Radon RN,  October 27, 2009 11:23 AM  Additional Follow-up for Phone Call Additional follow up Details #1::        Pt calling back today to check to see what the Dr wanted him to do. Additional Follow-up by: Raymond Gurney,  October 28, 2009 10:35 AM    Additional Follow-up for Phone Call Additional follow up Details #2::    Called and discussed with the patient.  New/Updated Medications: PERCOCET 5-325 MG TABS (OXYCODONE-ACETAMINOPHEN) take 1 pill by mouth q 6 hrs as needed pain Prescriptions: PERCOCET 5-325 MG TABS (OXYCODONE-ACETAMINOPHEN) take 1 pill by mouth q 6 hrs as needed pain  #40 x 0   Entered and Authorized by:   Lynne Leader MD   Signed by:   Lynne Leader MD on 10/28/2009   Method used:   Print then Give to Patient   RxID:   [id number]

## 2010-11-10 NOTE — Progress Notes (Signed)
Summary: triage  Phone Note Call from Patient Call back at Home Phone [phone]   Caller: Patient Summary of Call: Pt has had 2 fainting spells today. Initial call taken by: Raymond Gurney,  January 12, 2010 2:24 PM  Follow-up for Phone Call        the 1st time he knew he was going down & broke his fall. 3 hrs later he passed out without time to react. hit his head on a sink. hit top of forehead. no bruising or swelling. out for 1-2 minutes per co-worker. appt at 3:30 with Dr. Darene Lamer. he is aware he will not be seeing his pcp  also asked about results from recent test Follow-up by: Elige Radon RN,  January 12, 2010 2:27 PM

## 2010-11-10 NOTE — Assessment & Plan Note (Signed)
Summary: np6/syncope/jml   Visit Type:  Follow-up Primary Provider:  Lynne Leader  CC:  syncope.  History of Present Illness: Patient is a 50 year old who was referred for evaluation of syncope. Per the patient in march he was at work around 9:30 in AM.  Had half a donut and coffee for breakfast.  He was working when suddenly he could not hear things as well.  Became Nauseated, dizzy.  Then felt his heart race.  Coworker said he blacked out for a couple minutes.  He does not remember . Was a little confused after.  His head hurt where he hit it. Since then has had some dizzy spells but no syncope.  Not a lot of worning. He was started on Zoloft for panic attacks.  This has helped some.  Current Medications (verified): 1)  Zoloft 100 Mg Tabs (Sertraline Hcl) .Marland Kitchen.. 1 By Mouth Daily  Allergies (verified): 1)  Hydrocodone  Past History:  Past Surgical History: Last updated: 01/05/2010 All teeth removed 12/2009.  Awaiting dentures.  Family History: Last updated: 09/22/2009 Father with MI at 62 yo Brother with brain tumor Multiple males with prostate cancer  Social History: Last updated: 09/22/2009 Lives in [place] with girlfried. Works part time at a [employer].  No tobacco, alcahol or drugs.  Has a pain contract currently on 09/2009 due to patient with history of multiple ED visits for dental pain prior to establishing as a patient.   Past Medical History: L hand injury Syncope HTN SBP 160s in 09/2009 Panic Disorder Dental caries with tooth fractures.  Hx of shingles in the past.   Review of Systems       All systems reviewed.  Negative to the above problem except as noted above.  Vital Signs:  Patient profile:   50 year old male Height:      66 inches Weight:      165 pounds Pulse (ortho):   74 / minute BP standing:   131 / 85  Vitals Entered By: Lubertha Basque, CNA (Mar 02, 2010 12:18 PM)  Serial Vital Signs/Assessments:  Time      Position  BP        Pulse  Resp  Temp     By 0 min     Lying LA  134/80   65                    Lubertha Basque, CNA 0 min     Sitting   130/81   71                    Lubertha Basque, CNA 0 min     Standing  131/85   74                    Lubertha Basque, CNA 3 min     Sitting   130/82   78                    Lubertha Basque, CNA  Comments: 0 min pt lightheaded when he sat up -after standing he had just a little dizziness By: Lubertha Basque, CNA    Physical Exam  Additional Exam:  Patient is in NAD HEENT:  Normocephalic, atraumatic. EOMI, PERRLA.  Neck: JVP is normal. No thyromegaly. No bruits.  Lungs: clear to auscultation. No rales no wheezes.  Heart: Regular rate and rhythm. Normal S1, S2. No S3.   No significant  murmurs. PMI not displaced.  Abdomen:  Supple, nontender. Normal bowel sounds. No masses. No hepatomegaly.  Extremities:   Good distal pulses throughout. No lower extremity edema. L wrist in brace. Musculoskeletal :moving all extremities.  Neuro:   alert and oriented x3.    EKG  Procedure date:  03/02/2010  Findings:      NSR.  61 bpm.  Impression & Recommendations:  Problem # 1:  SYNCOPE (ICD-780.2) Episode sounds vagal.  He is not orthostatic on exam today.  I would recommend increased salt and fluid.  Will schedule echo.  Problem # 2:  HYPERTENSION, BENIGN ESSENTIAL (ICD-401.1) Follow.    Other Orders: EKG w/ Interpretation (93000) Echocardiogram (Echo)  Patient Instructions: 1)  Your physician has requested that you have an echocardiogram.  Echocardiography is a painless test that uses sound waves to create images of your heart. It provides your doctor with information about the size and shape of your heart and how well your heart's chambers and valves are working.  This procedure takes approximately one hour. There are no restrictions for this procedure. we will call you with results.
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
## 2010-11-10 NOTE — Miscellaneous (Signed)
Summary: c/o yawning & jaw pain  Clinical Lists Changes ststes he has jaw pain from yawning so much. says he yawns every minute or 2. unable to come in until Tursday pm. awware he will not be seeing his md.checked with Dr. Nori Riis. ok to wait for thursday appt.Marland KitchenMarland KitchenMarland KitchenElige Radon RN  March 17, 2010 4:41 PM

## 2010-11-10 NOTE — Assessment & Plan Note (Signed)
Summary: tooth pain & meds/[place]/corey   Vital Signs:  Patient profile:   50 year old male Height:      66 inches Weight:      164 pounds BMI:     26.57 Temp:     98.3 degrees F oral Pulse rate:   73 / minute BP sitting:   146 / 63  (left arm) Cuff size:   regular  Vitals Entered By: Schuyler Amor CMA (May 13, 2010 3:56 PM) CC: dental pain Is Patient Diabetic? No Pain Assessment Patient in pain? yes     Location: dental Intensity: 10   Primary Care Provider:  Lynne Leader  CC:  dental pain.  History of Present Illness: 50 yo M:  1. Mouth/TMJ Pain: x months, starting with tooth extraction. Please see previous visits for more details. Was seen recently by Dr. Conception Chancy, xray confirmed tooth fragments. Patient states that he has upcoming appointment with oral surgeon for removal (8/24 or 8/26). He then plans to get dentures. Pain is concentrated at TMJ. It is controlled with 1 to 1.5 Percocet daily. He requests an Rx to get him to his appointment with the oral surgeon. He brings in the instructions that he was given at the last visit, which read " If dentures are not received in on month call for refill." When asked why he came in early (as he has upcoming appt with PCP) he states that he has to go out of town, and will not be back for a couple of weeks.  Habits & Providers  Alcohol-Tobacco-Diet     Tobacco Status: never  Allergies: 1)  Hydrocodone  Physical Exam  General:  Alert, good eye contact, respectful. Vitals reviewed. Head:  Normocephalic and atraumatic without obvious abnormalities.  Ears:  TMs normal, canals clear of wax.  Very tender at the TMJ bilaterally, has distinct point pressure that replicated the pain. Nose:  No external deformity and no nasal discharge.   Mouth:  Many teeth missing. Decreased incisal opening 2/2 pain. Left click heard and distraction palpated with incisal opening. Psych:  Normally interactive and not depressed appearing.      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 Patient with upcoming appointment with oral surgeon for newly found teeth fragments on xray. Afterwards, he will persue dentures. Reviewed [place] Narcotic Hx - Percocet #30 (7/12), Percocet #5 (7/6), Percocet #30 (5/16). Patient with exam and history c/w true need for opiod Rx. Discussed appropriate use of medication and medical system for obtaining Rx in future.  Plan discussed with patient: 1. Follow up with Dr. Georgina Snell (and only him) for pain from now on. 2. We are requesting records from Dentist. 3. Continue with NSAIDS and TMJ exercises.  Patient agreed with all above. He stated that he wanted to continue with Dr. Georgina Snell as his PCP.   Orders: [place]- Est Level  3 ([address])  Complete Medication List: 1)  Zoloft 100 Mg Tabs (Sertraline hcl) .Marland Kitchen.. 1 by mouth daily 2)  Percocet 5-325 Mg Tabs (Oxycodone-acetaminophen) .... One tab daily for sever pain and to promote rest 3)  Ibuprofen 600 Mg Tabs (Ibuprofen) .... Two times a day for tmj pain  Patient Instructions: 1)  I am refilling you Percocet today, #30 with NO REFILLS. 2)  You must see Dr. Georgina Snell (and only him) before your next refill. 3)  We  are requesting records from your dentist. Prescriptions: PERCOCET 5-325 MG TABS (OXYCODONE-ACETAMINOPHEN) one tab daily for sever pain and to promote rest Brand medically necessary #30 x 0   Entered and Authorized by:   Briscoe Deutscher DO   Signed by:   Briscoe Deutscher DO on 05/13/2010   Method used:   Print then Give to Patient   RxID:   [id number]   Appended Document: tooth pain & meds/[place]/corey     Allergies: 1)  Hydrocodone  Review of Systems General:  Denies chills and fever. ENT:  Denies decreased hearing, ear discharge, earache, nasal congestion, and sore throat. MS:  Complains of joint pain;  denies joint redness and joint swelling. Neuro:  Complains of headaches; denies numbness and tingling.   Complete Medication List: 1)  Zoloft 100 Mg Tabs (Sertraline hcl) .Marland Kitchen.. 1 by mouth daily 2)  Percocet 5-325 Mg Tabs (Oxycodone-acetaminophen) .... One tab daily for sever pain and to promote rest 3)  Ibuprofen 600 Mg Tabs (Ibuprofen) .... Two times a day for tmj pain   Prevention & Chronic Care Immunizations   Influenza vaccine: Not documented   Influenza vaccine deferral: Not available  (02/23/2010)    Tetanus booster: Not documented   Td booster deferral: Deferred  (10/14/2009)    Pneumococcal vaccine: Not documented  Other Screening   Smoking status: never  (05/13/2010)  Lipids   Total Cholesterol: 190  (10/02/2009)   LDL: 130  (10/02/2009)   LDL Direct: Not documented   HDL: 35  (10/02/2009)   Triglycerides: 126  (10/02/2009)  Hypertension   Last Blood Pressure: 146 / 63  (05/13/2010)   Serum creatinine: 0.97  (01/12/2010)   Serum potassium 3.5  (01/12/2010)    Hypertension flowsheet reviewed?: Yes   Progress toward BP goal: Deteriorated  Self-Management Support :   Personal Goals (by the next clinic visit) :      Personal blood pressure goal: 140/90  (10/14/2009)   Patient will work on the following items until the next clinic visit to reach self-care goals:     Medications and monitoring: take my medicines every day, bring all of my medications to every visit  (05/16/2010)     Eating: drink diet soda or water instead of juice or soda, eat more vegetables, use fresh or frozen vegetables, eat foods that are low in salt, eat baked foods instead of fried foods, eat fruit for snacks and desserts, limit or avoid alcohol  (05/16/2010)     Activity: take a 30 minute walk every day, take the stairs instead of the elevator, park at the far end of the parking lot  (05/16/2010)    Hypertension self-management support: Written self-care plan  (05/16/2010)   Hypertension  self-care plan printed.

## 2010-11-10 NOTE — Progress Notes (Signed)
Summary: called pt/FYI/ts  Phone Note Refill Request Call back at Home Phone [phone]   Refills Requested: Medication #1:  PERCOCET 5-325 MG TABS one tab daily for sever pain and to promote rest [BMN] Initial call taken by: Samara Snide,  May 04, 2010 2:01 PM  Follow-up for Phone Call        last refill of percocet 04-21-10 #30 called pt. reports that he had teeth pulled and has ear pain...nerve pain.  takes percocet two times a day now and that's why he ran out of his rx early. i told the pt, that i can not promise that dr.corey will refill his rx. will fwd. to dr.corey for review.  Follow-up by: Mauricia Area CMA,,  May 04, 2010 3:38 PM  Additional Follow-up for Phone Call Additional follow up Details #1::        No pt must come for scheduled visit to discuss pain medication.  Additional Follow-up by: Lynne Leader MD,  May 05, 2010 12:17 PM

## 2010-11-10 NOTE — Progress Notes (Signed)
Summary: triage  Phone Note Call from Patient Call back at Home Phone [phone]   Caller: Patient Summary of Call: pt needs to talk to nurse about ear pain Initial call taken by: Audie Clear,  May 13, 2010 2:12 PM  Follow-up for Phone Call        states he is having pain from pieces of teeth left in  bone after having several teeth pulled. he has been to an oral surgeon & this has been taken care of. needs more pain meds. told him he needs to come in. placed in work in schedule Follow-up by: Elige Radon RN,  May 13, 2010 2:16 PM

## 2010-11-10 NOTE — Letter (Signed)
Summary: Generic Letter  [facility]  [address]   [place], [place] [address]   Phone: [phone]  Fax: [phone]    10/13/2009  Connor Solomon [address] [place], [place]  [address]  Dear Mr. HENGEL,  I am writing to you to discuss your recent lab results. Overall your labs look OK.  Your cholesterol is higher than I would like but in an OK range for you.  We will discuss your lab results further at your next checkup.  Please call the clinic if you have any other questions.     Sincerely,   Lynne Leader MD  Appended Document: Generic Letter mailed.

## 2010-11-10 NOTE — Progress Notes (Signed)
Summary: meds prob  Phone Note Call from Patient Call back at Home Phone [phone]   Caller: Patient Summary of Call:  panic attacks are getting worse and needs to know what he can do.  meds don't seem to be working Initial call taken by: Audie Clear,  October 24, 2009 1:39 PM  Follow-up for Phone Call        states he gets panic attack more often.  They last 15-20 minutes. about once a week. started 6 months ago after death in family. states nothing helps.  when girlfriend is present, she holds his hand & talks to him. this does help. he states "he is sick of this" suggested hospice grief counsellors. will give to md to see if there is something else that can be done  Follow-up by: sally caldwell,rn  Additional Follow-up for Phone Call Additional follow up Details #1::        As long as not suicidal then can go ahed and increase up to $Rem'100mg'mrVU$  instead of waiting until next Tuesday.  Strongly recommend we give him some resources to schedule a counseling or therapy appt..  If able to stay with family instead of being alone this may help too.  Additional Follow-up by: Beatrice Lecher MD,  October 24, 2009 2:02 PM    Additional Follow-up for Phone Call Additional follow up Details #2::    encouraged him to increase zoloft per md. wife works during the day. he is home alone. gets worse when when he visits his quad brother. does not like to be around people. states he will be ok while wife is home. he agreed with plan. I gave him the number to [place]. urged him to talk with someone. he will think about it Follow-up by: Isabelle Course,  October 24, 2009 3:24 PM

## 2010-11-10 NOTE — Letter (Signed)
Summary: Appointment - Missed  [facility] [place]  [address] [place]   [place], [place] [address]   Phone: [phone]  Fax: [phone]     April 03, 2010 MRN: [id number]   ELWIN TSOU [address] [place], [place]  [address]   Dear Mr. LANGWORTHY,  [facility] records indicate you missed your echocardiogram appointment on 03/23/2010. It is very important that we reach you to reschedule this appointment. We look forward to participating in your health care needs. Please contact us at the number listed above at your earliest convenience to reschedule this appointment.     Sincerely,   Darnell Level [facility] Scheduling Team

## 2010-11-10 NOTE — Assessment & Plan Note (Signed)
Summary: f/u visit/bmc   Vital Signs:  Patient profile:   50 year old male Height:      66 inches Weight:      167 pounds BMI:     27.05 Temp:     98.3 degrees F oral Pulse rate:   73 / minute BP sitting:   167 / 73  (left arm) Cuff size:   regular  Vitals Entered By: Enid Skeens, CMA (June 09, 2010 2:42 PM) CC: f/u TMJ Is Patient Diabetic? No Pain Assessment Patient in pain? no        Primary Care Provider:  Lynne Leader  CC:  f/u TMJ.  History of Present Illness: Tooth Pain: Pt had tooth fragment removed two weeks ago with resolution of mouth pain. Feels well this issue is resolved.   Anxiety: Doing well but having some new problems. His daughter and her children have moved in temporally.  They expect to be in for the next 6 months.  Mr Benish notes increasing nervousness over the past few weeks but no panic attacks. His brother gave him some valium which helped with the anxiety. He wonders if that medication would help his anxiety for the next few months. [facility] asked he states that he is not interested in consueling currently but may be open to it in the future.   Habits & Providers  Alcohol-Tobacco-Diet     Tobacco Status: never  Current Problems (verified): 1)  Syncope  (ICD-780.2) 2)  Insomnia  (ICD-780.52) 3)  Wrist Pain, Left  (ICD-719.43) 4)  Panic Disorder  (ICD-300.01) 5)  Hypertension, Benign Essential  (ICD-401.1)  Current Medications (verified): 1)  Zoloft 100 Mg Tabs (Sertraline Hcl) .Marland Kitchen.. 1 By Mouth Daily 2)  Percocet 5-325 Mg Tabs (Oxycodone-Acetaminophen) .... One Tab Daily For Sever Pain and To Promote Rest 3)  Ibuprofen 600 Mg Tabs (Ibuprofen) .... Two Times A Day For Tmj Pain 4)  Zoloft 50 Mg Tabs (Sertraline Hcl) .Marland Kitchen.. 1 By Mouth Daily With $RemoveBe'100mg'iIxtDudoE$  Tab (Dose 150 Daily).  Allergies (verified): 1)  Hydrocodone  Past History:  Past Medical History: Last updated: 03/02/2010 L hand injury Syncope HTN SBP 160s in 09/2009 Panic Disorder Dental  caries with tooth fractures.  Hx of shingles in the past.   Review of Systems  The patient denies anorexia, chest pain, headaches, depression, and unusual weight change.    Physical Exam  General:  Vs noted.  well male in NAD Lungs:  Normal respiratory effort, chest expands symmetrically. Lungs are clear to auscultation, no crackles or wheezes. Heart:  Normal rate and regular rhythm. S1 and S2 normal without gallop, murmur, click, rub or other extra sounds. Psych:  Cognition and judgment appear intact. Alert and cooperative with normal attention span and concentration. No apparent delusions, illusions, hallucinations   Impression & Recommendations:  Problem # 1:  PANIC DISORDER (ICD-300.01) Continues to have bothersome anxiety symptoms despote $RemoveBeforeDE'100mg'IeITgeBvwgivIOA$  zoloft. Pt interested in benzoduazipam. I offered therapy referral as symptoms are bothersome and pt declined. Will reserve BZDs for short term control of panic disorder not for long term control of anxiety.  Plan to increase zoloft to $RemoveB'150mg'arXDgQDt$  daily and follow in 1-2 months.  If symptoms are not well controlled plan to refer to consueling.    His updated medication list for this problem includes:    Zoloft 100 Mg Tabs (Sertraline hcl) .Marland Kitchen... 1 by mouth daily    Zoloft 50 Mg Tabs (Sertraline hcl) .Marland Kitchen... 1 by mouth daily with $RemoveBe'100mg'VcPRfZdvD$  tab (dose 150  daily).  Orders: [place]- Est Level  3 ([address])  Complete Medication List: 1)  Zoloft 100 Mg Tabs (Sertraline hcl) .Marland Kitchen.. 1 by mouth daily 2)  Percocet 5-325 Mg Tabs (Oxycodone-acetaminophen) .... One tab daily for sever pain and to promote rest 3)  Ibuprofen 600 Mg Tabs (Ibuprofen) .... Two times a day for tmj pain 4)  Zoloft 50 Mg Tabs (Sertraline hcl) .Marland Kitchen.. 1 by mouth daily with $RemoveBe'100mg'BsdTnZwjX$  tab (dose 150 daily).  Patient Instructions: 1)  Thank you for seeing me today. 2)  Start taking $RemoveBefo'100mg'DFUjcQkjiTX$  and $Re'50mg'rYe$  of zoloft daily. 3)  Keep trying to find ways to de-stress at home.  4)  If it becomes overwhelming let me  know.  5)  Please schedule a follow-up appointment in 1-3 months .  Prescriptions: ZOLOFT 50 MG TABS (SERTRALINE HCL) 1 by mouth daily with $RemoveBe'100mg'mPfWuRkhf$  tab (dose 150 daily).  #30 x 12   Entered and Authorized by:   Lynne Leader MD   Signed by:   Lynne Leader MD on 06/09/2010   Method used:   Print then Give to Patient   RxID:   [id number]

## 2010-11-10 NOTE — Progress Notes (Signed)
Summary: triage  Phone Note Call from Patient Call back at Home Phone [phone]   Caller: Patient Summary of Call: Pt has wrist pain. Initial call taken by: Raymond Gurney,  Feb 23, 2010 9:23 AM  Follow-up for Phone Call        was getting out of shower. dnies falling. when drying off wrist "locked up" has been in bad pain since. taking ibu & iceing it. to come over now. Follow-up by: Elige Radon RN,  Feb 23, 2010 9:39 AM

## 2010-11-10 NOTE — Assessment & Plan Note (Signed)
Summary: pain control. see notes/[place]/Corey   Vital Signs:  Patient profile:   50 year old male Weight:      159 pounds Temp:     98.9 degrees F oral Pulse rate:   105 / minute BP sitting:   119 / 85  (left arm) Cuff size:   regular  Vitals Entered By: Schuyler Amor CMA (December 17, 2009 2:44 PM) CC: jaw pain from tooth removal Is Patient Diabetic? No Pain Assessment Patient in pain? yes     Location: jaw Intensity: 10   Primary Care Provider:  Lynne Leader  CC:  jaw pain from tooth removal.  History of Present Illness: CC: dental pain  1. dental pain - total of about 18 teeth pulled, last 2 days ago.  Ran out of percocets yesterday.  Pt taking 2 ibuprofen pills every 6 hours.  dentist would not prescribe percocets.  finished amoxicillin course per dentist.  Pt states allergic to vicodin and motrin Q 6 hours not working.  Icing cheek.  also ear pain.  Habits & Providers  Alcohol-Tobacco-Diet     Tobacco Status: never  Allergies: 1)  Hydrocodone  Past History:  Past Medical History: Last updated: 09/22/2009 Left hand and wrist fractures that require surgury.  However patient cannot afford surgury at this time.  He wears a brace and is able to function at work.  HTN SBP 160s in 09/2009 Panic Disorder Dental caries with tooth fractures.  Hx of shingles in the past.   Social History: Last updated: 09/22/2009 Lives in [place] with girlfried. Works part time at a [employer].  No tobacco, alcahol or drugs.  Has a pain contract currently on 09/2009 due to patient with history of multiple ED visits for dental pain prior to establishing as a patient.   Physical Exam  General:  VS noted. Well appearing uncomfortable R jaw pain Head:  Normocephalic and atraumatic without obvious abnormalities. No apparent alopecia or balding. Ears:  L TM pearly grey with good light reflex. R TM occluded by hard cerumen impaction Mouth:  s/p dental work.  + swollen right  mandible and maxilla.  no erythema.  No evidence of abscess. Neck:  No deformities, masses, or tenderness noted.  No LAD   Impression & Recommendations:  Problem # 1:  DENTAL CARIES OF ROOT SURFACE (ICD-521.08)  2 days ago last dental work, ran out of percocets yesterday (which he was using as prescribed, q6 hours as needed pain, received #30 on 12/08/2009 and lasted him 9 days).  Using ibuprofen as well but not regularly.  advised to stop IBUPROFEN, schedule NAPROSYN twice daily for next 10 days and use PERCOCET as BREAKTHROUGH.  Max 3 pills a day (q8 hours) but to space out as this should last him until pain gone.  nonsmoker so not as concerned with dry socket.  Orders: [place]- Est Level  3 ([address])  Problem # 2:  CERUMEN IMPACTION, RIGHT (ICD-380.4) associated right ear pain, decreased hearing.  cerumen impaction noted.  wax too hard and close to TM to clean so prescribed debrox, max 4 days to use.  Complete Medication List: 1)  Percocet 5-325 Mg Tabs (Oxycodone-acetaminophen) .... Take 1 pill by mouth q 6 hrs as needed pain 2)  Penicillin V Potassium 500 Mg Tabs (Penicillin v potassium) .Marland Kitchen.. 1 tab by mouth two times a day untill tooth extraction 3)  Zoloft 50 Mg Tabs (Sertraline hcl) .... Take 1 per day for 1 week then $RemoveB'75mg'UMAaviww$  per day  for 1 week then 2 per day ($Remove'100mg'HZCftKd$ ) until you see me. 4)  Hydrochlorothiazide 25 Mg Tabs (Hydrochlorothiazide) .... Take 1 pill by mouth daily 5)  Trazodone Hcl 50 Mg Tabs (Trazodone hcl) .Marland Kitchen.. 1 by mouth at bedtime as needed insomnia 6)  Naprosyn 500 Mg Tabs (Naproxen) .... Take one by mouth two times a day x 10 days then as needed 7)  Debrox 6.5 % Soln (Carbamide peroxide) .... 3-4 drops twice daily for cerumen impaction  Patient Instructions: 1)  STOP ibuprofen.  Start naprosyn $RemoveBefore'500mg'YsZbJQchvWrBH$  twice daily for 10 days.   2)  Percocets for breakthrough pain AFTER naprosyn. 3)  Continue icing jaw, continue popsicles as much as you can to help with inflammation. 4)  Try to  SPACE OUT percocets as much as possible because these should last you until pain is gone.  max 3 times a day. 5)  Debrox 3-4 drops in R ear twice daily. Prescriptions: DEBROX 6.5 % SOLN (CARBAMIDE PEROXIDE) 3-4 drops twice daily for cerumen impaction  #1 x 0   Entered and Authorized by:   Ria Bush  MD   Signed by:   Ria Bush  MD on 12/17/2009   Method used:   Print then Give to Patient   RxID:   [id number] PERCOCET 5-325 MG TABS (OXYCODONE-ACETAMINOPHEN) take 1 pill by mouth q 6 hrs as needed pain  #30 x 0   Entered and Authorized by:   Ria Bush  MD   Signed by:   Ria Bush  MD on 12/17/2009   Method used:   Print then Give to Patient   RxID:   [id number] NAPROSYN 500 MG TABS (NAPROXEN) take one by mouth two times a day x 10 days then as needed  #30 x 0   Entered and Authorized by:   Ria Bush  MD   Signed by:   Ria Bush  MD on 12/17/2009   Method used:   Print then Give to Patient   RxID:   [id number]

## 2010-11-10 NOTE — Assessment & Plan Note (Signed)
Summary: ear ache/dizzy,df   Vital Signs:  Patient profile:   50 year old male Weight:      169.1 pounds Pulse rate:   72 / minute BP sitting:   104 / 73  (right arm)  Vitals Entered By: Mauricia Area CMA, (April 21, 2010 10:13 AM) CC: bilateral ear pain. had all teeth were pulled 8 weeks ago. c/o pain after using dentures. Is Patient Diabetic? No Pain Assessment Patient in pain? yes     Location: teeth Intensity: 10 Onset of pain  x 8 weeks   Primary Care Provider:  Lynne Leader  CC:  bilateral ear pain. had all teeth were pulled 8 weeks ago. c/o pain after using dentures..  History of Present Illness: Fourth visit with different providers for 'ear pain' and tinnits that occured after complete tooth extraction 8 weeks ago.  Most recently was prescribed 5 Percocet, he reports it helped him so much and he was able to sleep.  The pain and the noise are driving him 'crazy", he has been very irritible at work and at home.   He wife has been patient.  He is very short of money, paying $100 per month to [facility].  With each visit his bills are larger.  He is also paying for teeth extraction, and making small payments on his dentures.  He was told by the individual who is fitting him for dentures that the pain is likely because he has no teeth and the change in bite and movement of the jaw are causing him this.  Up to this point he has felt that it was his ears causing the pain, and reports never having ear problems.  He has been tried on tramadol, nortriptyline, auralgan drops, debrox, and expensive NSAIDS; the only thing that helped his pain was Percocet.  He feels that he can get along with one per day to ensure that he sleeps until he can get his teeth purchased.  He asked if he could pay less here so that he could make highter payments for his teeth.  We openly discussed the concern about potential narcotic addiction. He did say that he lived with very painful teeth for many years that required  visits to the ER for antibiotics and pain meds.  That pain is now gone.  He works full time in [employer].  He has no health care benefits.  He reports severe depression that was treated with Zoloft.  He does have intermittent migranes but they are less frequent.    Habits & Providers  Alcohol-Tobacco-Diet     Tobacco Status: never  Current Medications (verified): 1)  Zoloft 100 Mg Tabs (Sertraline Hcl) .Marland Kitchen.. 1 By Mouth Daily 2)  Sumatriptan Succinate 25 Mg Tabs (Sumatriptan Succinate) .... Take 1 Tab At The Onset of Headache.  May Repeat Times 1 in 2 Hours If No Relief. 3)  Percocet 5-325 Mg Tabs (Oxycodone-Acetaminophen) .... One Tab Daily For Sever Pain and To Promote Rest 4)  Ibuprofen 600 Mg Tabs (Ibuprofen) .... Two Times A Day For Tmj Pain  Allergies: 1)  Hydrocodone  Review of Systems      See HPI General:  Denies chills and fever. ENT:  Complains of earache and ringing in ears.  Physical Exam  General:  Alert, good eye contact, respectful Ears:  TMs normal, canals clear of wax.  Very tender at the TMJ bilaterally, has distinct point pressure that replicated the pain. Mouth:  gums healed, very irregular Neck:  no adenopathy  Lungs:  normal respiratory effort and normal breath sounds.   Heart:  normal rate and regular rhythm.     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  Discussed decreasing amount paid monthly to medical bills by renegotiating, and pay more toward his dentures.  Taught him TMJ exercises, pulled up info and diagrams on the internet for him to read.  Contracted for one Percocet daily, #30 no refills with hope that he will get his dentures this month. Orders: [place] [facility]- Est Level  3 ([address]2800)  Complete Medication List: 1)  Zoloft 100 Mg Tabs (Sertraline hcl) .Marland Kitchen.. 1 by mouth daily 2)   Sumatriptan Succinate 25 Mg Tabs (Sumatriptan succinate) .... Take 1 tab at the onset of headache.  may repeat times 1 in 2 hours if no relief. 3)  Percocet 5-325 Mg Tabs (Oxycodone-acetaminophen) .... One tab daily for sever pain and to promote rest 4)  Ibuprofen 600 Mg Tabs (Ibuprofen) .... Two times a day for tmj pain  Patient Instructions: 1)  Begin TMJ exercises 2)  Use rubs and ice to the joint area 3)  Try to work on relaxation of the joint as well 4)  May use the percocet at bedtime to sleep 5)  Use ibuprofen 600 mg two times a day with food (mobic is expensive) 6)  If dentures are not received in on month call for refill Prescriptions: PERCOCET 5-325 MG TABS (OXYCODONE-ACETAMINOPHEN) one tab daily for sever pain and to promote rest Brand medically necessary #30 x 0   Entered and Authorized by:   Tereasa Coop NP   Signed by:   Tereasa Coop NP on 04/21/2010   Method used:   Print then Give to Patient   RxID:   [id number]

## 2010-11-10 NOTE — Progress Notes (Signed)
Summary: triage  Phone Note Call from Patient Call back at Home Phone [phone]   Caller: Patient Summary of Call: Pt has appt Wednesday with the dental clinic and worried that they will not do anything because his gums are all swollen and painful.  Should he be seen before the appt by [place] to get on some antibotics? Initial call taken by: Raymond Gurney,  December 01, 2009 10:32 AM  Follow-up for Phone Call        left message with woman who answered the phone that we returned his call Follow-up by: Elige Radon RN,  December 01, 2009 11:47 AM  Additional Follow-up for Phone Call Additional follow up Details #1::        no answer. since he never called back, I assume his dental appt yesterday went well. will wait for him to call back if we are needed. Additional Follow-up by: Elige Radon RN,  December 04, 2009 10:20 AM    Additional Follow-up for Phone Call Additional follow up Details #2::    Called patient, He had 6 teeth pulled yesterday.  Feels well. Issue resolved

## 2010-11-10 NOTE — Assessment & Plan Note (Signed)
Summary: foot prob/numbness.df   Vital Signs:  Patient profile:   50 year old male Height:      66 inches Weight:      172 pounds BMI:     27.86 Temp:     97.9 degrees F oral Pulse rate:   76 / minute BP sitting:   124 / 76  (right arm) Cuff size:   regular  Vitals Entered By: Levert Feinstein LPN (July 13, 3613 9:32 AM) CC: pain in right foot with numbness in toes x 3 weeks Is Patient Diabetic? No Pain Assessment Patient in pain? yes     Location: right foot   Primary Care Provider:  Lynne Leader  CC:  pain in right foot with numbness in toes x 3 weeks.  History of Present Illness:    Pain in right foot in many places, on sole of foot , over metarsals, lateral aspect of small toe. Works in [employer] does not remember any particular injury, wears steel toe boots per report, past 3 weeks pain has persisted, occ swelling, tried Ultram no help, took his brother percocet which helped. Now with ankle pain on right side and limp, favoring his left foot at this. Pt also noted his toes have been numb starting with the 4th digit then progressing to 3rd and 5th digit . Tried elevation, warm soaks , no help  Habits & Providers  Alcohol-Tobacco-Diet     Tobacco Status: never  Current Medications (verified): 1)  Zoloft 100 Mg Tabs (Sertraline Hcl) .Marland Kitchen.. 1 By Mouth Daily 2)  Percocet 5-325 Mg Tabs (Oxycodone-Acetaminophen) .... One Tab Daily For Sever Pain and To Promote Rest 3)  Ibuprofen 600 Mg Tabs (Ibuprofen) .... Two Times A Day For Tmj Pain 4)  Zoloft 50 Mg Tabs (Sertraline Hcl) .Marland Kitchen.. 1 By Mouth Daily With $RemoveBe'100mg'qSKovnEvC$  Tab (Dose 150 Daily). 5)  Naprosyn 500 Mg Tabs (Naproxen) .Marland Kitchen.. 1 By Mouth Two Times A Day X 4 Weeks, Then As Needed  Allergies (verified): 1)  Hydrocodone  Past History:  Past Medical History: Last updated: 03/02/2010 L hand injury Syncope HTN SBP 160s in 09/2009 Panic Disorder Dental caries with tooth fractures.  Hx of shingles in the past.     Review of  Systems       Per HPI  Physical Exam  General:  Vs noted.  well male in NAD Msk:  Right foot- normal ROM at ankle, non tender talar dome, TTP on lateral aspect of foot ,+ squeeze test, pain with flexion of toes caudally, able to move each digiti without pain , no swelling visualzied, tendons in tact, achiles tendon in tact, no erythema or abrasion noted, no brusing noted Tender on sole of foot, near metarsal arch, pain over heel (old heel spur) Pulses:  DP and PT intact bilat  Neurologic:  normal propioception of right foot, decreased monofilament compared to left on 3rd-5th digits on right foot, normal reflexes antalgic gait   Impression & Recommendations:  Problem # 1:  FOOT PAIN, RIGHT (ICD-729.5) Assessment New Unclear cause of pain as exam does not follow any particular pattern, as ?injury occcured on work site, will give prescription for walking boot for stabilize and relieve pressure, obtain complete x-ray, anti-inflammatories and short course of pain meds, RTC in 2 weeks. Precepted with SM, differentials tendon strain, metarsalgia, possible small fracture If no injury on x-ray, pt can use ACE wrap while at work for support Orders: Radiology other (Radiology Other) [facility]- Est Level  3 (43154)  Complete Medication List: 1)  Zoloft 100 Mg Tabs (Sertraline hcl) .Marland Kitchen.. 1 by mouth daily 2)  Percocet 5-325 Mg Tabs (Oxycodone-acetaminophen) .... One tab daily for sever pain and to promote rest 3)  Ibuprofen 600 Mg Tabs (Ibuprofen) .... Two times a day for tmj pain 4)  Zoloft 50 Mg Tabs (Sertraline hcl) .Marland Kitchen.. 1 by mouth daily with $RemoveBe'100mg'JedyDEVmG$  tab (dose 150 daily). 5)  Naprosyn 500 Mg Tabs (Naproxen) .Marland Kitchen.. 1 by mouth two times a day x 4 weeks, then as needed  Patient Instructions: 1)  Pick up the walking boot 2)  I will call you with your x-ray results 3)  Try to keep pressure off that foot 4)  Take the anti-inflammatory, save the pain medicine for severe pain 5)  Return in 2 weeks to  follow-up with your primary doctor Prescriptions: NAPROSYN 500 MG TABS (NAPROXEN) 1 by mouth two times a day x 4 weeks, then as needed  #60 x 1   Entered and Authorized by:   Vic Blackbird MD   Signed by:   Vic Blackbird MD on 07/13/2010   Method used:   Handwritten   RxID:   [id number]

## 2010-11-10 NOTE — Assessment & Plan Note (Signed)
Summary: HA-see notes/[place]   Vital Signs:  Patient profile:   50 year old male Height:      66 inches Weight:      162.2 pounds BMI:     26.27 Temp:     97.9 degrees F oral Pulse rate:   69 / minute BP sitting:   130 / 88  (left arm) Cuff size:   regular  Vitals Entered By: Levert Feinstein LPN (March 11, 3809 1:75 AM) CC: migraine with dizziness x 2 days Is Patient Diabetic? No Pain Assessment Patient in pain? yes     Location: wrist   Primary Care Provider:  Lynne Leader  CC:  migraine with dizziness x 2 days.  History of Present Illness: 1. Headache:  Pt presents with a headache for 2 days.  He had a similar headache 3 years ago when he went to the ED and they gave him some shots and he got better.  This headache is described as a throbbing, squeezing pain.  It is located in the front and sides of his head.  Rated a 9/10.  It is persistent and is not getting better.  It is better with rest and sitting still.  It is made worse with exposure to light and noise.  It is associated with some dizziness and nausea.  ROS: denies fever, rash, chest pain, shortness of breath, numbness / weakness, difficulty with gait, vision changes  Additional history:  Pt reassessed after Toradol injection.  He was resting comfortably and sleeping.  He stated that headache was much improved.  Now rated a 4/10.    Habits & Providers  Alcohol-Tobacco-Diet     Tobacco Status: never  Current Medications (verified): 1)  Zoloft 100 Mg Tabs (Sertraline Hcl) .Marland Kitchen.. 1 By Mouth Daily 2)  Sumatriptan Succinate 25 Mg Tabs (Sumatriptan Succinate) .... Take 1 Tab At The Onset of Headache.  May Repeat Times 1 in 2 Hours If No Relief. 3)  Tramadol Hcl 50 Mg Tabs (Tramadol Hcl) .... Take 1 Tab By Mouth Every 6 Hours As Needed For Pain 4)  Promethazine Hcl 25 Mg Tabs (Promethazine Hcl) .... Take 1 Tab By Mouth Every 6 Hours As Needed For Nausea  Allergies: 1)  Hydrocodone  Past History:  Past Medical  History: Reviewed history from 03/02/2010 and no changes required. L hand injury Syncope HTN SBP 160s in 09/2009 Panic Disorder Dental caries with tooth fractures.  Hx of shingles in the past.   Social History: Reviewed history from 09/22/2009 and no changes required. Lives in [place] with girlfried. Works part time at a cultured [employer].  No tobacco, alcahol or drugs.  Has a pain contract currently on 09/2009 due to patient with history of multiple ED visits for dental pain prior to establishing as a patient.   Physical Exam  General:  VS noted.  Sitting in dark room, appears uncomfortable but in no acute distress.  Head:  normocephalic and atraumatic.   Eyes:  vision grossly intact.  PERRL, EOMI Mouth:  Oral mucosa and oropharynx without lesions or exudates.  Teeth in good repair. Neck:  No deformities, masses, or tenderness noted. Lungs:  Normal respiratory effort, chest expands symmetrically. Lungs are clear to auscultation, no crackles or wheezes. Heart:  Normal rate and regular rhythm. S1 and S2 normal without gallop, murmur, click, rub or other extra sounds. Abdomen:  Bowel sounds positive,abdomen soft and non-tender without masses, organomegaly or hernias noted. Msk:  R wrist - normal  L wrist -  mild swelling at radiocarpal joint; no bony abnormality. No specific snuff box tenderness. ROM limited in all dimensions, but especially so with flexion and lateral/mediali deviation. Grip strength 4-/5. Neurovascularly intact.  Neurologic:  No cranial nerve deficits noted. Station and gait are normal. Plantar reflexes are down-going bilaterally. DTRs are symmetrical throughout. Sensory, motor and coordinative functions appear intact. Skin:  no rashes and no suspicious lesions.   Psych:  normally interactive and not depressed appearing.   Additional Exam:  Reviewed notes from prior ED visit in 2005 for headache:  He received multiple doses of Dilaudid as well as Toradol and  Phenergan.   Impression & Recommendations:  Problem # 1:  MIGRAINE HEADACHE (ICD-346.90) Assessment New  History c/w migraine headache.  It also improved with Toradol.  Advised him to take it easy for the next couple of days.  Also prescribed Sumatriptan as abortive, Ultram for pain control, and Phenergan for nausea.  Precautions given to seek medical care. His updated medication list for this problem includes:    Sumatriptan Succinate 25 Mg Tabs (Sumatriptan succinate) .Marland Kitchen... Take 1 tab at the onset of headache.  may repeat times 1 in 2 hours if no relief.    Tramadol Hcl 50 Mg Tabs (Tramadol hcl) .Marland Kitchen... Take 1 tab by mouth every 6 hours as needed for pain  Orders: [facility]- Est  Level 4 ([address]214)  Complete Medication List: 1)  Zoloft 100 Mg Tabs (Sertraline hcl) .Marland Kitchen.. 1 by mouth daily 2)  Sumatriptan Succinate 25 Mg Tabs (Sumatriptan succinate) .... Take 1 tab at the onset of headache.  may repeat times 1 in 2 hours if no relief. 3)  Tramadol Hcl 50 Mg Tabs (Tramadol hcl) .... Take 1 tab by mouth every 6 hours as needed for pain 4)  Promethazine Hcl 25 Mg Tabs (Promethazine hcl) .... Take 1 tab by mouth every 6 hours as needed for nausea  Other Orders: Ketorolac-Toradol 15mg  (N2355)  Patient Instructions: 1)  I am glad that you are feeling better 2)  You are going to need to take it easy for the next couple of days 3)  If you feel much better by tomorrow then you can go to work 4)  If not better in 24 to 48 hrs you need to come back to clinic and be seen again 5)  If the headache gets worse or you develop new symptoms like fevers, rash, neck pain, problems walking, vision problems, or numbness / weakness then you need to come back sooner or go to the ED. Prescriptions: PROMETHAZINE HCL 25 MG TABS (PROMETHAZINE HCL) Take 1 tab by mouth every 6 hours as needed for nausea  #20 x 0   Entered and Authorized by:   Mylinda Latina MD   Signed by:   Mylinda Latina MD on 03/11/2010   Method used:    Print then Give to Patient   RxID:   [id number] TRAMADOL HCL 50 MG TABS (TRAMADOL HCL) Take 1 tab by mouth every 6 hours as needed for pain  #40 x 0   Entered and Authorized by:   Mylinda Latina MD   Signed by:   Mylinda Latina MD on 03/11/2010   Method used:   Print then Give to Patient   RxID:   [id number] SUMATRIPTAN SUCCINATE 25 MG TABS (SUMATRIPTAN SUCCINATE) Take 1 tab at the onset of headache.  May repeat times 1 in 2 hours if no relief.  #20 x 0   Entered and Authorized by:   Tommi Rumps  Tye Savoy MD   Signed by:   Mylinda Latina MD on 03/11/2010   Method used:   Print then Give to Patient   RxID:   [id number]    Medication Administration  Injection # 1:    Medication: Ketorolac-Toradol 15mg     Diagnosis: HEADACHE (ICD-784.0)    Route: IM    Site: LUOQ gluteus    Exp Date: 09/11/2011    Lot #: [id number]    Mfr: hospira    Comments: 60mg  given    Patient tolerated injection without complications    Given by: Christen Bame CMA (March 11, 2010 10:44 AM)  Orders Added: 1)  Ketorolac-Toradol 15mg  [J1885] 2)  [facility]- Est  Level 4 [37357]

## 2010-11-10 NOTE — Progress Notes (Signed)
Summary: triage  Phone Note Call from Patient Call back at [phone]   Caller: Patient Summary of Call: The ear pain is worse the medication given to him yesterday has made him nauseated. Initial call taken by: Raymond Gurney,  April 15, 2010 3:14 PM  Follow-up for Phone Call        wants antibiotics. states he cannot wait to be seen. must get relief now. placed with Dr. Tye Savoy as he had a cancellation at 4. states the meds he got for this are not helping Follow-up by: Elige Radon RN,  April 15, 2010 3:20 PM

## 2010-11-10 NOTE — Progress Notes (Signed)
Summary: refill  Phone Note Refill Request Call back at Home Phone [phone] Message from:  Patient  Refills Requested: Medication #1:  HYDROCHLOROTHIAZIDE 25 MG TABS take 1 pill by mouth daily.   Notes: pt is out  Medication #2:  KLONOPIN 0.5 MG TABS take 1 for panic attack prn [facility]   Initial call taken by: Audie Clear,  October 23, 2009 1:51 PM  Follow-up for Phone Call        spoke with step son & asked that he have pt call us back. HCTZ has refills & just got Klonopin on 10/14/09 Follow-up by: Elige Radon RN,  October 23, 2009 2:09 PM  Additional Follow-up for Phone Call Additional follow up Details #1::        Pt returned call he can be reached at home. Additional Follow-up by: Raymond Gurney,  October 23, 2009 3:21 PM    Additional Follow-up for Phone Call Additional follow up Details #2::    no answer  Follow-up by: Isabelle Course,  October 23, 2009 3:39 PM  Additional Follow-up for Phone Call Additional follow up Details #3:: Details for Additional Follow-up Action Taken: Rx printed and placed in the to be faxed pile. Pt informed. Will provide 40 more percocets for the last time. Pt will pick them up at the front desk.  Will not refil the Klonipin. Will restart a low dose Zoloft at 25 and increase the dose by 25 every two week.s  Prescriptions: HYDROCHLOROTHIAZIDE 25 MG TABS (HYDROCHLOROTHIAZIDE) take 1 pill by mouth daily  #30 x 5   Entered and Authorized by:   Lynne Leader MD   Signed by:   Lynne Leader MD on 10/28/2009   Method used:   Printed then faxed to ...       [facility] (retail)       [place], [place]  [address]       Ph: [phone]       Fax: [phone]   RxID:   [id number] PENICILLIN V POTASSIUM 500 MG TABS (PENICILLIN V POTASSIUM) 1 tab by mouth two times a day untill tooth extraction  #60 x 1   Entered and Authorized by:   Lynne Leader MD   Signed by:   Lynne Leader MD on 10/28/2009  Method used:   Printed then faxed to ...       [facility] (retail)       [place], [place]  [address]       Ph: [phone]       Fax: [phone]   RxID:   [id number] HYDROCHLOROTHIAZIDE 25 MG TABS (HYDROCHLOROTHIAZIDE) take 1 pill by mouth daily  #30 x 5   Entered by:   Isabelle Course   Authorized by:   Lynne Leader MD   Signed by:   Isabelle Course on 10/23/2009   Method used:   Printed then faxed to ...       [facility] (retail)       [address] [place], [place]  [address]       Ph: [phone]       Fax: [phone]   RxID:   [id number]  faxed to health dept. told him I will fax request for klonopin to pcp. pt states he has plenty now but does not think it will last until his  next appt on 11/17/09.Isabelle Course  October 23, 2009 3:44 PM

## 2010-11-10 NOTE — Miscellaneous (Signed)
Summary: walk in  Clinical Lists Changes walked in c/o R flank pain x 6 days. states percocet is the only med that helps. states he did try the napryson & it did not help. placed in work in schedule. aware there may be a wait.Elige Radon RN  January 02, 2010 2:25 PM
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
## 2011-01-11 LAB — BASIC METABOLIC PANEL
BUN: 14 mg/dL (ref 6–23)
Calcium: 10.1 mg/dL (ref 8.4–10.5)
Creatinine, Ser: 1.12 mg/dL (ref 0.4–1.5)
GFR calc Af Amer: >60 mL/min (ref >60)
GFR calc non Af Amer: >60 mL/min (ref >60)
Glucose, Bld: 104 mg/dL — ABNORMAL HIGH (ref 70–99)
Potassium: 3.6 mEq/L (ref 3.5–5.1)
Sodium: 132 mEq/L — ABNORMAL LOW (ref 135–145)

## 2011-01-11 LAB — POCT CARDIAC MARKERS
CKMB, poc: <1 ng/mL — ABNORMAL LOW (ref 1.0–8.0)
Myoglobin, poc: 102 ng/mL (ref 12–200)

## 2011-01-11 LAB — CBC
Hemoglobin: 15.5 g/dL (ref 13.0–17.0)
MCHC: 34.3 g/dL (ref 30.0–36.0)
Platelets: 295 10*3/uL (ref 150–400)
RDW: 13.1 % (ref 11.5–15.5)

## 2011-01-13 LAB — CBC
Hemoglobin: 13.8 g/dL (ref 13.0–17.0)
MCHC: 35 g/dL (ref 30.0–36.0)
Platelets: 255 10*3/uL (ref 150–400)
RBC: 4.53 MIL/uL (ref 4.22–5.81)
WBC: 6.4 10*3/uL (ref 4.0–10.5)

## 2011-01-13 LAB — DIFFERENTIAL
Basophils Absolute: 0 10*3/uL (ref 0.0–0.1)
Eosinophils Absolute: 0.5 10*3/uL (ref 0.0–0.7)
Eosinophils Relative: 8 % — ABNORMAL HIGH (ref 0–5)
Lymphs Abs: 1.7 10*3/uL (ref 0.7–4.0)

## 2011-01-13 LAB — POCT I-STAT, CHEM 8
Creatinine, Ser: 1 mg/dL (ref 0.4–1.5)
Hemoglobin: 13.6 g/dL (ref 13.0–17.0)
Sodium: 140 mEq/L (ref 135–145)
TCO2: 26 mmol/L (ref 0–100)

## 2011-01-13 LAB — POCT CARDIAC MARKERS: Troponin i, poc: <0.05 ng/mL (ref 0.00–0.09)

## 2011-01-20 ENCOUNTER — Inpatient Hospital Stay (INDEPENDENT_AMBULATORY_CARE_PROVIDER_SITE_OTHER)
Admission: RE | Admit: 2011-01-20 | Discharge: 2011-01-20 | Disposition: A | Discharge status: Home / Self Care | Payer: Self-pay | Source: Ambulatory Visit | Attending: Family Medicine | Admitting: Family Medicine

## 2011-01-20 DIAGNOSIS — M26609 Unspecified temporomandibular joint disorder, unspecified side: Secondary | ICD-10-CM

## 2011-02-16 ENCOUNTER — Emergency Department: Payer: Self-pay | Admitting: Emergency Medicine

## 2011-02-21 ENCOUNTER — Emergency Department: Payer: Self-pay | Admitting: Emergency Medicine

## 2011-04-06 ENCOUNTER — Emergency Department: Payer: Self-pay | Admitting: Emergency Medicine

## 2011-04-15 ENCOUNTER — Emergency Department (HOSPITAL_COMMUNITY)
Admission: EM | Admit: 2011-04-15 | Discharge: 2011-04-15 | Disposition: A | Discharge status: Home / Self Care | Payer: Self-pay | Attending: Emergency Medicine | Admitting: Emergency Medicine

## 2011-04-15 DIAGNOSIS — M545 Low back pain, unspecified: Secondary | ICD-10-CM | POA: Insufficient documentation

## 2011-04-15 DIAGNOSIS — I10 Essential (primary) hypertension: Secondary | ICD-10-CM | POA: Insufficient documentation

## 2011-04-15 DIAGNOSIS — X58XXXA Exposure to other specified factors, initial encounter: Secondary | ICD-10-CM | POA: Insufficient documentation

## 2011-04-15 DIAGNOSIS — S335XXA Sprain of ligaments of lumbar spine, initial encounter: Secondary | ICD-10-CM | POA: Insufficient documentation

## 2011-06-09 ENCOUNTER — Ambulatory Visit (HOSPITAL_COMMUNITY)
Admission: RE | Admit: 2011-06-09 | Discharge: 2011-06-09 | Disposition: A | Discharge status: Home / Self Care | Payer: Self-pay | Source: Ambulatory Visit | Attending: Family Medicine | Admitting: Family Medicine

## 2011-06-09 ENCOUNTER — Encounter: Payer: Self-pay | Admitting: Family Medicine

## 2011-06-09 ENCOUNTER — Ambulatory Visit (INDEPENDENT_AMBULATORY_CARE_PROVIDER_SITE_OTHER): Payer: Self-pay | Admitting: Family Medicine

## 2011-06-09 VITALS — BP 131/78 | HR 80 | Ht 66.0 in | Wt 172.0 lb

## 2011-06-09 DIAGNOSIS — R42 Dizziness and giddiness: Secondary | ICD-10-CM | POA: Insufficient documentation

## 2011-06-09 DIAGNOSIS — M25549 Pain in joints of unspecified hand: Secondary | ICD-10-CM | POA: Insufficient documentation

## 2011-06-09 DIAGNOSIS — M19049 Primary osteoarthritis, unspecified hand: Secondary | ICD-10-CM | POA: Insufficient documentation

## 2011-06-09 DIAGNOSIS — H8190 Unspecified disorder of vestibular function, unspecified ear: Secondary | ICD-10-CM

## 2011-06-09 DIAGNOSIS — R609 Edema, unspecified: Secondary | ICD-10-CM | POA: Insufficient documentation

## 2011-06-09 DIAGNOSIS — S62629A Displaced fracture of medial phalanx of unspecified finger, initial encounter for closed fracture: Secondary | ICD-10-CM

## 2011-06-09 DIAGNOSIS — IMO0002 Reserved for concepts with insufficient information to code with codable children: Secondary | ICD-10-CM

## 2011-06-09 DIAGNOSIS — S6990XA Unspecified injury of unspecified wrist, hand and finger(s), initial encounter: Secondary | ICD-10-CM | POA: Insufficient documentation

## 2011-06-09 NOTE — Progress Notes (Signed)
  Subjective:    Patient ID: Connor Solomon, male    DOB: 1961/05/04, 50 y.o.   MRN: [id number]  HPI 1. Right 3rd finger Fracture Patient fell on right hand and sustained a fracture of the DIP of the right 3rd finger. Xray done today reveals a closed Fracture Dislocation of the DIP of the right 3rd finger.  2. Dizziness Patient has 3 week history of dizziness and unbalance associated with periodic tinnitus that sounds like a Cicada chirping. The dizziness is associated with position such as rapid standing or head between legs. He has no orthostatic hypotension. No carotid bruit. No neurological findings. He has a retracted left TM. His hearing was tested with audiometry and normal. His cerebellar exam was normal. He has no fever. He has no antibiotic or other drug usage. He does have a long history of loud noise exposure.  He has no chest pain, no palpitations. He has no syncope.  No syphilis history, or evidence of tabes dorsalis.   Review of Systems  Constitutional: Negative for fever and fatigue.  HENT: Positive for tinnitus. Negative for hearing loss, ear pain, congestion, trouble swallowing, neck pain, sinus pressure and ear discharge.   Eyes: Negative for visual disturbance.       No nystagmus  Cardiovascular: Negative for chest pain.  Gastrointestinal: Negative for nausea, vomiting, abdominal pain, diarrhea and constipation.  Musculoskeletal: Negative for gait problem.  Skin: Negative for rash.  Neurological: Positive for dizziness and light-headedness. Negative for seizures, syncope, facial asymmetry, speech difficulty, weakness and headaches.       Objective:   Physical Exam  Nursing note and vitals reviewed. Constitutional: He is oriented to person, place, and time. He appears well-developed and well-nourished. No distress.  HENT:  Head: Normocephalic and atraumatic.  Right Ear: Hearing, tympanic membrane, external ear and ear canal normal.  Left Ear: Hearing, external ear  and ear canal normal. Tympanic membrane is retracted.  Eyes: Conjunctivae are normal. Pupils are equal, round, and reactive to light. Right eye exhibits normal extraocular motion and no nystagmus. Left eye exhibits normal extraocular motion and no nystagmus.  Neck: Neck supple. Normal carotid pulses present. Carotid bruit is not present.  Cardiovascular: Normal rate, regular rhythm and normal heart sounds.   No murmur heard. Pulmonary/Chest: Effort normal and breath sounds normal. No respiratory distress.  Musculoskeletal:       Right hand: He exhibits decreased range of motion, tenderness, deformity and swelling. He exhibits no laceration.       Hands:      Deformity of DIP right 3rd finger.  Neurological: He is alert and oriented to person, place, and time. He has normal strength and normal reflexes. No cranial nerve deficit or sensory deficit. He displays a negative Romberg sign. Coordination and gait normal. GCS eye subscore is 4. GCS verbal subscore is 5. GCS motor subscore is 6.      Assessment & Plan:  1. Right 3rd finger Fracture - xray right hand revealed closed fracture dislocation of DIP - referral to [facility] for splinting and possible surgery  2. Vestibular Dizziness - appears inner ear related.  - plan for MRI with MRA to assess vertebral system and cerebellum. - pt. Refuses to get test done because he has to self pay and does not want to pay for it. - f/u Dr. Georgina Snell

## 2011-06-10 ENCOUNTER — Ambulatory Visit: Payer: Self-pay | Admitting: Family Medicine

## 2011-06-10 ENCOUNTER — Telehealth: Payer: Self-pay | Admitting: *Deleted

## 2011-06-10 ENCOUNTER — Ambulatory Visit (INDEPENDENT_AMBULATORY_CARE_PROVIDER_SITE_OTHER): Payer: Self-pay | Admitting: Family Medicine

## 2011-06-10 ENCOUNTER — Encounter: Payer: Self-pay | Admitting: Family Medicine

## 2011-06-10 VITALS — BP 138/92 | HR 82 | Temp 97.7°F | Ht 68.0 in | Wt 171.0 lb

## 2011-06-10 DIAGNOSIS — S6990XA Unspecified injury of unspecified wrist, hand and finger(s), initial encounter: Secondary | ICD-10-CM

## 2011-06-10 NOTE — Assessment & Plan Note (Signed)
his mechanism, exam, and x-rays negative for fracture (though with foreign body patient has had for 20 years) consistent with mallet finger.  Treat with dorsal aluminum splint with DIP in extension, buddy taped to adjacent finger for next 6-8 weeks.  Continue icing, elevating.  Ok to take nsaids as needed.  Had planned to prescribe tramadol 50mg  tid #90 as a one time prescription but he has a notation in his chart that only his PCP is to prescribe narcotic pain medications if necessary.  I advised him to call his PCP's office to inquire about this and I would put a note in his chart.  F/u in 2 weeks to reassess compliance with splinting and to reexamine him.  Consider repeat x-rays depending on his exam at that time.

## 2011-06-10 NOTE — Telephone Encounter (Signed)
Attempted to call patient no answer or voicemail. He has appointment tomorrow with [facility] at 8:15 am. Patient was waiting for me to schedule appointment but left before I could get the appointment time. He will need to bring $200 or they will not see him, he was notified of that before he left yesterday.Connor Solomon, Kevin Fenton

## 2011-06-10 NOTE — Progress Notes (Signed)
Subjective:    Patient ID: Connor Solomon, male    DOB: [birth year], 50 y.o.   MRN: [id number]  PCP: Lynne Leader  HPI 50 yo M here for right 3rd finger injury.  Patient reports on 8/29 he felt dizzy, tripped, and fell sustaining an axial load type injury to his right 3rd finger. Immediate pain and swelling. He reports he felt like he had to 'pop it back in place' but that he couldn't. No deformity immediately after other than swelling. Has been icing and elevating. X-rays done in PCPs office show probable foreign body but no acute fracture. 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 With his injury yesterday he did not puncture the skin. His pain is primarily on dorsal aspect of DIP joint.  Past Medical History  Diagnosis Date  . Hypertension   . Syncope   . Panic disorder     Current Outpatient Prescriptions on File Prior to Visit  Medication Sig Dispense Refill  . ibuprofen (ADVIL,MOTRIN) 600 MG tablet Take 600 mg by mouth 2 (two) times daily. For TMJ pain.       . naproxen (NAPROSYN) 500 MG tablet Take 500 mg by mouth 2 (two) times daily. For 4 weeks, then as needed.       . sertraline (ZOLOFT) 50 MG tablet Take 50 mg by mouth daily. Take with 100 mg tab (dose 150 mg daily).         History reviewed. No pertinent past surgical history.  Allergies  Allergen Reactions  . Hydrocodone     REACTION: Nausea    History   Social History  . Marital Status: Single    Spouse Name: N/A    Number of Children: N/A  . Years of Education: N/A   Occupational History  . Not on file.   Social History Main Topics  . Smoking status: Never Smoker   . Smokeless tobacco: Not on file  . Alcohol Use: Not on file  . Drug Use: Not on file  . Sexually Active: Not on file   Other Topics Concern  . Not on file   Social History Narrative  . No narrative on file    Family History  Problem Relation Age of Onset  . Heart  attack Father   . Diabetes Neg Hx   . Hyperlipidemia Neg Hx   . Hypertension Neg Hx     BP 138/92  Pulse 82  Temp(Src) 97.7 F (36.5 C) (Oral)  Ht $R'5\' 8"'yU$  (1.727 m)  Wt 171 lb (77.565 kg)  BMI 26.00 kg/m2  Review of Systems See HPI above.    Objective:   Physical Exam Gen: NAD R 3rd digit: Mild swelling especially over dorsal aspect DIP joint.  Heberdens nodes also present here and other digits. Questionable mild radial deviation of 3rd digit - appears similar on left hand however. Mod-severe TTP dorsal aspect of DIP joint.  Mild TTP volar DIP. Collateral ligaments intact of DIP. Able to flex and extend at MCP, PIP against resistance.  Can flex at DIP against resistance but pain and minimal resistance with extension at DIP. NVI distally with < 3 sec cap refill.     Assessment & Plan:  1. R 3rd digit injury - his mechanism, exam, and x-rays negative for fracture (though with foreign body patient has had for 20 years) consistent with mallet finger.  Treat with dorsal aluminum splint with DIP in extension, buddy  taped to adjacent finger for next 6-8 weeks.  Continue icing, elevating.  Ok to take nsaids as needed.  Had planned to prescribe tramadol 50mg  tid #90 as a one time prescription but he has a notation in his chart that only his PCP is to prescribe narcotic pain medications if necessary.  I advised him to call his PCP's office to inquire about this and I would put a note in his chart.  F/u in 2 weeks to reassess compliance with splinting and to reexamine him.  Consider repeat x-rays depending on his exam at that time.

## 2011-06-10 NOTE — Patient Instructions (Signed)
Your x-rays show no fracture, just the foreign body you had from 20 years ago. Wear the splint as directed - when you to go wash your finger, you have to make sure your finger is straight when you slide the splint off (how I showed you). Ice your finger 15 minutes at a time 3-4 times a day. Elevate above the level of your heart as much as possible. Follow up with me in 2 weeks for a recheck.

## 2011-06-24 ENCOUNTER — Ambulatory Visit: Payer: Self-pay | Admitting: Family Medicine

## 2011-06-28 ENCOUNTER — Emergency Department: Payer: Self-pay | Admitting: Emergency Medicine

## 2011-07-06 ENCOUNTER — Emergency Department (HOSPITAL_COMMUNITY): Payer: Self-pay

## 2011-07-06 ENCOUNTER — Emergency Department (HOSPITAL_COMMUNITY)
Admission: EM | Admit: 2011-07-06 | Discharge: 2011-07-06 | Disposition: A | Discharge status: Home / Self Care | Payer: No Typology Code available for payment source | Attending: Emergency Medicine | Admitting: Emergency Medicine

## 2011-07-06 DIAGNOSIS — M25539 Pain in unspecified wrist: Secondary | ICD-10-CM | POA: Insufficient documentation

## 2011-07-06 DIAGNOSIS — M79609 Pain in unspecified limb: Secondary | ICD-10-CM | POA: Insufficient documentation

## 2011-07-13 ENCOUNTER — Ambulatory Visit: Payer: No Typology Code available for payment source | Admitting: Family Medicine

## 2011-07-16 ENCOUNTER — Ambulatory Visit: Payer: No Typology Code available for payment source | Admitting: Family Medicine

## 2011-07-21 ENCOUNTER — Ambulatory Visit (INDEPENDENT_AMBULATORY_CARE_PROVIDER_SITE_OTHER): Payer: Self-pay | Admitting: Family Medicine

## 2011-07-21 ENCOUNTER — Encounter: Payer: Self-pay | Admitting: Family Medicine

## 2011-07-21 VITALS — BP 153/87 | HR 90 | Wt 170.0 lb

## 2011-07-21 DIAGNOSIS — H8190 Unspecified disorder of vestibular function, unspecified ear: Secondary | ICD-10-CM

## 2011-07-21 DIAGNOSIS — R42 Dizziness and giddiness: Secondary | ICD-10-CM

## 2011-07-21 DIAGNOSIS — M533 Sacrococcygeal disorders, not elsewhere classified: Secondary | ICD-10-CM

## 2011-07-21 MED ORDER — TRAMADOL HCL 50 MG PO TABS: 50.0000 mg | ORAL_TABLET | Freq: Four times a day (QID) | ORAL | Status: AC | PRN

## 2011-07-21 NOTE — Assessment & Plan Note (Signed)
As coccyx pain is so profound and interfering with workup will focus on that problem. Will follow up in 2-4 weeks.  Encouraged pt to get orange card so we can do workup to include ECHO, Holter Monitor, and Brain MRI/A.  Gave red flags and will follow closely.

## 2011-07-21 NOTE — Assessment & Plan Note (Signed)
Due to direct trauma at work.  Will obtain X-ray and follow up.  Provide tramadol and Ibuprofen.  Follow up in 2-4 weeks.  Red flags reviewed and handout given.

## 2011-07-21 NOTE — Patient Instructions (Signed)
Thank you for coming in today. Take ibuprofen and tramadol for pain.  Get those X-rays.  You may get some relief from a sitting donut. If they are too expensive at the pharmacy you can make yourself one out of a pool noodle.  Get that [employer] soon.  See me in 1 month or as soon as you have that card.   Tailbone Injury (Coccyx Injury) The coccyx is the small bone at the lower end of the spine. The pain you are having is due to stretched ligaments, bruising, or a fracture (break in bone) of the tailbone. These will gradually feel better in 4 to 6 weeks. HOME CARE INSTRUCTIONS  Apply ice to the sore area for 10 minutes each hour while awake for the first 1 to 2 days. Put the ice in a plastic bag and place a towel between the bag of ice and your skin.   Sitting on a large rubber or inflated ring (donut) or cushion may ease the pain.   Increase your activity as the pain allows.   Take medications as directed by your caregiver.   Only take over-the-counter or prescription medicines for pain, discomfort, or fever as directed by your caregiver.   Stool softeners may be used if it is painful to have a bowel movement.  SEEK MEDICAL CARE IF:  Your pain becomes worse.   Bowel movements cause a great deal of discomfort.   You are unable to have a bowel movement.  Document Released: 09/24/2000 Document Re-Released: 12/24/2008 [facility] Patient Information 2011 [place].

## 2011-07-21 NOTE — Progress Notes (Signed)
Connor Solomon presents to clinic today for:  1) Coccyx injury: He was at work yesterday when he became dizzy he fell over and landed right on his bottom. He had immediate pain in his tailbone region. His pain with walking and sitting. He denies any weakness or numbness in his lower extremities or changes in bowel or bladder function.    2) Vertigo: This is a continuation of a problem that was established in August. He was seen by Dr. Vallarie Mare who thought this was for do to dysfunction of the vestibular basilar system. Dr. Vallarie Mare ordered MRI which Connor Solomon is unable to pay for.  Connor Solomon is in process of getting the [facility] card from [facility]. He notes that he has brief periods of vertigo especially when standing or lifting that are associated with tinnitus and blurry vision.  He denies any vertigo when he changes his head position like looking over his shoulder.  He denies any palpitations or chest pain associated with vertigo.  PMH reviewed.  ROS as above otherwise neg Medications reviewed.  Exam:  BP 153/87  Pulse 90  Wt 170 lb (77.111 kg) Gen: Well NAD HEENT: EOMI,  MMM, Left TM retracted.  Lungs: CTABL Nl WOB Heart: RRR no MRG Abd: NABS, NT, ND Exts: Non edematous BL  LE, warm and well perfused.  Neuro: AOx3 CN2-12 intact.  Sensation, Strength, Reflex intact. FNF and RAM normal. No nystagmus. Gait painful. Unable to do  Dix-Hallpike maneuver due to back pain.  MSK: Non tender over spine midline or paraspinal muscles except for coccyx which is tender.

## 2011-07-26 ENCOUNTER — Emergency Department: Payer: Self-pay | Admitting: Emergency Medicine

## 2011-07-28 ENCOUNTER — Emergency Department (HOSPITAL_COMMUNITY)
Admission: EM | Admit: 2011-07-28 | Discharge: 2011-07-28 | Disposition: A | Discharge status: Home / Self Care | Payer: Self-pay | Attending: Emergency Medicine | Admitting: Emergency Medicine

## 2011-07-28 DIAGNOSIS — M545 Low back pain, unspecified: Secondary | ICD-10-CM | POA: Insufficient documentation

## 2011-07-28 DIAGNOSIS — X500XXA Overexertion from strenuous movement or load, initial encounter: Secondary | ICD-10-CM | POA: Insufficient documentation

## 2011-07-28 DIAGNOSIS — I10 Essential (primary) hypertension: Secondary | ICD-10-CM | POA: Insufficient documentation

## 2011-07-28 DIAGNOSIS — Y9289 Other specified places as the place of occurrence of the external cause: Secondary | ICD-10-CM | POA: Insufficient documentation

## 2011-07-30 ENCOUNTER — Emergency Department: Payer: Self-pay | Admitting: Emergency Medicine

## 2011-08-27 ENCOUNTER — Emergency Department (HOSPITAL_COMMUNITY): Payer: Self-pay

## 2011-08-27 ENCOUNTER — Encounter (HOSPITAL_COMMUNITY): Payer: Self-pay

## 2011-08-27 ENCOUNTER — Emergency Department (HOSPITAL_COMMUNITY)
Admission: EM | Admit: 2011-08-27 | Discharge: 2011-08-27 | Disposition: A | Discharge status: Home / Self Care | Payer: Self-pay | Attending: Emergency Medicine | Admitting: Emergency Medicine

## 2011-08-27 DIAGNOSIS — S63632A Sprain of interphalangeal joint of right middle finger, initial encounter: Secondary | ICD-10-CM

## 2011-08-27 DIAGNOSIS — Y9289 Other specified places as the place of occurrence of the external cause: Secondary | ICD-10-CM | POA: Insufficient documentation

## 2011-08-27 DIAGNOSIS — W208XXA Other cause of strike by thrown, projected or falling object, initial encounter: Secondary | ICD-10-CM | POA: Insufficient documentation

## 2011-08-27 DIAGNOSIS — Y990 Civilian activity done for income or pay: Secondary | ICD-10-CM | POA: Insufficient documentation

## 2011-08-27 DIAGNOSIS — M79609 Pain in unspecified limb: Secondary | ICD-10-CM | POA: Insufficient documentation

## 2011-08-27 DIAGNOSIS — I10 Essential (primary) hypertension: Secondary | ICD-10-CM | POA: Insufficient documentation

## 2011-08-27 DIAGNOSIS — W319XXA Contact with unspecified machinery, initial encounter: Secondary | ICD-10-CM | POA: Insufficient documentation

## 2011-08-27 DIAGNOSIS — S63639A Sprain of interphalangeal joint of unspecified finger, initial encounter: Secondary | ICD-10-CM | POA: Insufficient documentation

## 2011-08-27 MED ORDER — IBUPROFEN 800 MG PO TABS
800.0000 mg | ORAL_TABLET | Freq: Once | ORAL | Status: AC
  Administered 2011-08-27: 800 mg via ORAL
  Filled 2011-08-27: qty 1

## 2011-08-27 MED ORDER — OXYCODONE-ACETAMINOPHEN 5-325 MG PO TABS: 2.0000 | ORAL_TABLET | ORAL | Status: DC | PRN

## 2011-08-27 MED ORDER — IBUPROFEN 800 MG PO TABS: 800.0000 mg | ORAL_TABLET | Freq: Three times a day (TID) | ORAL | Status: DC | PRN

## 2011-08-27 NOTE — ED Notes (Signed)
Pt resting quietly at the time. States 7/10 R middle finger pain. No signs of distress at the time. Pt is alert and oriented x4. States decreased ROM to finger due to pain. Sensation intact. No other injuries noted.

## 2011-08-27 NOTE — Progress Notes (Signed)
Orthopedic Tech Progress Note Patient Details:  Connor Solomon 1961/02/04 [id number]  Type of Splint: Finger Splint Location: righrt--hand Splint Interventions: Application    Aneta Mins 08/27/2011, 3:42 PM

## 2011-08-27 NOTE — ED Notes (Signed)
Patient presents with right middle finger pain this morning at work. Patient unable to bend tip of finger but denies loss of sensation. Mild dislocation noted with swelling.

## 2011-08-27 NOTE — ED Notes (Signed)
R finger splint placed by ortho tech. Pt tolerated without difficulty. To be discharged home. No further questions regarding medications/follow up.

## 2011-08-27 NOTE — ED Provider Notes (Signed)
History     CSN: [id number] Arrival date & time: 08/27/2011  1:10 PM   First MD Initiated Contact with Patient 08/27/11 1334      Chief Complaint  Patient presents with  . 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 He reports pain with movement or palpation of the distal interphalangeal joint of the right middle finger. He denies any injury elsewhere to the hand or other extremities.  Patient is a 50 y.o. male presenting with hand pain. The history is provided by the patient.  Hand Pain This is a new problem. The current episode started less than 1 hour ago. The problem occurs constantly. The problem has not changed since onset.Pertinent negatives include no chest pain, no abdominal pain, no headaches and no shortness of breath. The symptoms are aggravated by bending and stress (palpation). The symptoms are relieved by relaxation (immobilization of DIP joint of right middle finger). He has tried nothing for the symptoms.    Past Medical History  Diagnosis Date  . Hypertension   . Syncope   . Panic disorder     History reviewed. No pertinent past surgical history.  Family History  Problem Relation Age of Onset  . Heart attack Father   . Diabetes Neg Hx   . Hyperlipidemia Neg Hx   . Hypertension Neg Hx     History  Substance Use Topics  . Smoking status: Never Smoker   . Smokeless tobacco: Not on file  . Alcohol Use: No      Review of Systems  Constitutional: Negative.   HENT: Negative.   Eyes: Negative.   Respiratory: Negative.  Negative for shortness of breath.   Cardiovascular: Negative.  Negative for chest pain.  Gastrointestinal: Negative.  Negative for abdominal pain.  Musculoskeletal: Positive for joint  swelling and arthralgias. Negative for myalgias and back pain.  Skin: Negative for color change, pallor, rash and wound.  Neurological: Negative for weakness, numbness and headaches.  Hematological: Does not bruise/bleed easily.    Allergies  Hydrocodone  Home Medications  No current outpatient prescriptions on file.  BP 130/77  Pulse 91  Temp(Src) 97.7 F (36.5 C) (Oral)  Resp 18  SpO2 98%  Physical Exam  Nursing note and vitals reviewed. Constitutional: He is oriented to person, place, and time. He appears well-developed and well-nourished. No distress.  HENT:  Head: Normocephalic and atraumatic.  Eyes: EOM are normal. Pupils are equal, round, and reactive to light.  Neck: Normal range of motion. Neck supple.  Cardiovascular: Normal rate and regular rhythm.   Pulmonary/Chest: Effort normal.  Musculoskeletal: He exhibits edema and tenderness.       Hands:      Tenderness and mild swelling, possible deformity to the right distal middle finger at the DIP joint.  No other apparent injuries.  Neurological: He is alert and oriented to person, place, and time. He has normal reflexes. He exhibits normal muscle tone.  Skin: Skin is warm and dry. No rash noted. No erythema. No pallor.  Psychiatric: He has a normal mood and affect. His behavior is normal.    ED Course  Procedures (including critical care time)  Labs Reviewed - No data to display No results found.   No diagnosis found.    MDM  Possible fracture/dislocation of  the right middle finger at the distal interphalangeal joint. Alternately, this may simply be a sprain of that joint. I will get x-rays to evaluate the injury further.        Charlena Cross, MD 08/27/11 [phone]

## 2011-08-30 ENCOUNTER — Emergency Department (HOSPITAL_COMMUNITY): Payer: Self-pay

## 2011-08-30 ENCOUNTER — Encounter (HOSPITAL_COMMUNITY): Payer: Self-pay | Admitting: *Deleted

## 2011-08-30 ENCOUNTER — Emergency Department (HOSPITAL_COMMUNITY)
Admission: EM | Admit: 2011-08-30 | Discharge: 2011-08-30 | Disposition: A | Discharge status: Home / Self Care | Payer: Self-pay | Attending: Emergency Medicine | Admitting: Emergency Medicine

## 2011-08-30 DIAGNOSIS — W319XXA Contact with unspecified machinery, initial encounter: Secondary | ICD-10-CM | POA: Insufficient documentation

## 2011-08-30 DIAGNOSIS — Y9269 Other specified industrial and construction area as the place of occurrence of the external cause: Secondary | ICD-10-CM | POA: Insufficient documentation

## 2011-08-30 DIAGNOSIS — S63613A Unspecified sprain of left middle finger, initial encounter: Secondary | ICD-10-CM

## 2011-08-30 DIAGNOSIS — Y990 Civilian activity done for income or pay: Secondary | ICD-10-CM | POA: Insufficient documentation

## 2011-08-30 DIAGNOSIS — S6990XA Unspecified injury of unspecified wrist, hand and finger(s), initial encounter: Secondary | ICD-10-CM | POA: Insufficient documentation

## 2011-08-30 DIAGNOSIS — IMO0002 Reserved for concepts with insufficient information to code with codable children: Secondary | ICD-10-CM | POA: Insufficient documentation

## 2011-08-30 NOTE — ED Notes (Signed)
Pt states that Friday at approximately 11:00 am he was on the job and because of equipment malfunctions his right middle hand was rapidly twisted and bent at the same time. He states that all of his hand was hurting that day, but that today, only his right middle digit is hurting. Patient states that he can not really feel it when it palpate his finger. He can move his finger, but not the distal joint. Patient cap refill <3 seconds. Patient has been taking ibuprofen for pain relief, he states that it has not been working at all. He states that Saturday he took a left over percocet from his recent wrist surgery that relieved the pain for >6 hours. VSS, NAD.

## 2011-08-30 NOTE — ED Provider Notes (Signed)
History     CSN: [id number] Arrival date & time: 08/30/2011  2:56 PM   First MD Initiated Contact with Patient 08/30/11 1506      Chief Complaint  Patient presents with  . Finger Injury    broken    (Consider location/radiation/quality/duration/timing/severity/associated sxs/prior treatment) Patient is a 50 y.o. male presenting with hand pain. The history is provided by the patient.  Hand Pain This is a new problem. The current episode started in the past 7 days. The problem occurs constantly. The problem has been unchanged. Associated symptoms include joint swelling. The symptoms are aggravated by bending. He has tried nothing for the symptoms.  Pt states he was at work 3 days ago when he injured his right hand. States there was an equipment malfunction, and his hand got stuck in a machine that bent his all fingers and hand backwards. States deformity and pain to right middle finger. No other pain or injuries currently.  Past Medical History  Diagnosis Date  . Hypertension   . Syncope   . Panic disorder     Past Surgical History  Procedure Date  . Wrist surgery     Family History  Problem Relation Age of Onset  . Heart attack Father   . Diabetes Neg Hx   . Hyperlipidemia Neg Hx   . Hypertension Neg Hx     History  Substance Use Topics  . Smoking status: Never Smoker   . Smokeless tobacco: Not on file  . Alcohol Use: No      Review of Systems  Musculoskeletal: Positive for joint swelling.  All other systems reviewed and are negative.    Allergies  Hydrocodone  Home Medications   Current Outpatient Rx  Name Route Sig Dispense Refill  . IBUPROFEN 200 MG PO TABS Oral Take 200-400 mg by mouth every 6 (six) hours as needed. For pain.     . OXYCODONE-ACETAMINOPHEN 5-325 MG PO TABS Oral Take 1 tablet by mouth every 4 (four) hours as needed. For pain.       BP 141/88  Pulse 87  Temp(Src) 98.7 F (37.1 C) (Oral)  Resp 16  Ht $R'5\' 8"'mY$  (1.727 m)  Wt 170 lb  (77.111 kg)  BMI 25.85 kg/m2  SpO2 100%  Physical Exam  Constitutional: He is oriented to person, place, and time. He appears well-developed and well-nourished. No distress.  Cardiovascular: Normal rate, regular rhythm and normal heart sounds.   Pulmonary/Chest: Effort normal and breath sounds normal.  Musculoskeletal:       Swelling and deformity to the DIP joint of right middle finger. Pain with movement of the finger. Normal rest of the hand exam.   Neurological: He is alert and oriented to person, place, and time.  Skin: Skin is warm and dry.  Psychiatric: He has a normal mood and affect.    ED Course  Procedures (including critical care time)  Labs Reviewed - No data to display Dg Finger Middle Right  08/30/2011  *RADIOLOGY REPORT*  Clinical Data: Injury to right third finger.  RIGHT MIDDLE FINGER 2+V  Comparison: 08/27/2011, 07/06/2011, 06/09/2011.  Findings: Stable radiograph demonstrating no acute fracture or dislocation.  Stable foreign body in the volar soft tissues of the distal digit.  Stable osteoarthritis.  Stable soft tissues without bony destruction or abnormal air.  IMPRESSION: No acute fracture.  Stable radiographic appearance of the right third finger over the last several months.  Original Report Authenticated By: Azzie Roup, M.D.  There are multiple x-rays taken in the past of this same finger. X-ray unchanged. I doubt this is a new deformity given x-rays all look the same. Finger splinted. Will d/c home with anti inflamatories.   [place], [place] 08/31/11 [phone]

## 2011-08-31 NOTE — ED Provider Notes (Signed)
Medical screening examination/treatment/procedure(s) were performed by non-physician practitioner and as supervising physician I was immediately available for consultation/collaboration.   Hoy Morn, MD 08/31/11 [phone]

## 2011-10-24 ENCOUNTER — Emergency Department: Payer: Self-pay | Admitting: Emergency Medicine

## 2011-10-31 ENCOUNTER — Encounter (HOSPITAL_COMMUNITY): Payer: Self-pay | Admitting: *Deleted

## 2011-10-31 ENCOUNTER — Emergency Department (HOSPITAL_COMMUNITY): Payer: Self-pay

## 2011-10-31 ENCOUNTER — Emergency Department (HOSPITAL_COMMUNITY)
Admission: EM | Admit: 2011-10-31 | Discharge: 2011-10-31 | Disposition: A | Discharge status: Home / Self Care | Payer: Self-pay | Attending: Emergency Medicine | Admitting: Emergency Medicine

## 2011-10-31 DIAGNOSIS — M25539 Pain in unspecified wrist: Secondary | ICD-10-CM | POA: Insufficient documentation

## 2011-10-31 DIAGNOSIS — W19XXXA Unspecified fall, initial encounter: Secondary | ICD-10-CM | POA: Insufficient documentation

## 2011-10-31 DIAGNOSIS — I10 Essential (primary) hypertension: Secondary | ICD-10-CM | POA: Insufficient documentation

## 2011-10-31 DIAGNOSIS — N644 Mastodynia: Secondary | ICD-10-CM | POA: Insufficient documentation

## 2011-10-31 DIAGNOSIS — IMO0001 Reserved for inherently not codable concepts without codable children: Secondary | ICD-10-CM | POA: Insufficient documentation

## 2011-10-31 MED ORDER — OXYCODONE-ACETAMINOPHEN 5-325 MG PO TABS
2.0000 | ORAL_TABLET | Freq: Once | ORAL | Status: AC
  Administered 2011-10-31: 2 via ORAL
  Filled 2011-10-31: qty 2

## 2011-10-31 MED ORDER — IBUPROFEN 800 MG PO TABS: 800.0000 mg | ORAL_TABLET | Freq: Three times a day (TID) | ORAL | Status: AC

## 2011-10-31 NOTE — ED Notes (Signed)
MD at bedside. 

## 2011-10-31 NOTE — Progress Notes (Signed)
Orthopedic Tech Progress Note Patient Details:  Connor Solomon [birth year] [id number]  Type of Splint: Thumb velcro Splint Location: applied thumb spica(velcro) to left hand. Splint Interventions: Application    Madelaine Etienne 10/31/2011, 9:59 AM

## 2011-10-31 NOTE — ED Notes (Signed)
Ortho paged for splint 

## 2011-10-31 NOTE — ED Notes (Signed)
Pt reports falling backwards and bracing himself with his left wrist, reports hx of wrist injury. States used ice at home without relief. No defomity noted. Able to wiggle fingers. Cap refill <3.

## 2011-10-31 NOTE — ED Notes (Signed)
Patient transported to X-ray 

## 2011-10-31 NOTE — ED Provider Notes (Signed)
History     CSN: [id number]  Arrival date & time 10/31/11  2197   First MD Initiated Contact with Patient 10/31/11 [phone]      Chief Complaint  Patient presents with  . Wrist Pain    (Consider location/radiation/quality/duration/timing/severity/associated sxs/prior treatment) HPI Comments: Patient presents with left breast pain after falling around 2 AM on a step landing with his wrist behind him. He did not hit his head or lose consciousness. Denies any head, neck, chest, abdominal back pain.  He denies any weakness, numbness, tingling, breakfast then. There is pain over his left dorsal wrist on the radial side. Is full range of motion of his fingers. No open wounds  Patient is a 51 y.o. male presenting with wrist pain. The history is provided by the patient.  Wrist Pain This is a new problem. The current episode started 3 to 5 hours ago. The problem occurs constantly. The problem has been gradually worsening. Pertinent negatives include no chest pain, no abdominal pain, no headaches and no shortness of breath. The symptoms are aggravated by twisting. The symptoms are relieved by ice. He has tried acetaminophen and a cold compress for the symptoms.    Past Medical History  Diagnosis Date  . Hypertension   . Syncope   . Panic disorder     Past Surgical History  Procedure Date  . Wrist surgery     Family History  Problem Relation Age of Onset  . Heart attack Father   . Diabetes Neg Hx   . Hyperlipidemia Neg Hx   . Hypertension Neg Hx     History  Substance Use Topics  . Smoking status: Never Smoker   . Smokeless tobacco: Not on file  . Alcohol Use: No      Review of Systems  Constitutional: Negative for activity change and appetite change.  HENT: Negative for congestion and rhinorrhea.   Respiratory: Negative for cough and shortness of breath.   Cardiovascular: Negative for chest pain.  Gastrointestinal: Negative for nausea, vomiting and abdominal pain.    Genitourinary: Negative for dysuria.  Musculoskeletal: Positive for myalgias and arthralgias.  Skin: Negative for rash.  Neurological: Negative for headaches.    Allergies  Hydrocodone  Home Medications   Current Outpatient Rx  Name Route Sig Dispense Refill  . IBUPROFEN 200 MG PO TABS Oral Take 200-400 mg by mouth every 6 (six) hours as needed. For pain.     . IBUPROFEN 800 MG PO TABS Oral Take 1 tablet (800 mg total) by mouth 3 (three) times daily. 21 tablet 0    BP 124/57  Pulse 95  Temp(Src) 97.6 F (36.4 C) (Oral)  Resp 16  SpO2 100%  Physical Exam  Constitutional: He is oriented to person, place, and time. He appears well-developed and well-nourished. No distress.  HENT:  Head: Normocephalic and atraumatic.  Eyes: Conjunctivae are normal. Pupils are equal, round, and reactive to light.  Neck: Normal range of motion.  Cardiovascular: Normal rate, regular rhythm and normal heart sounds.   Pulmonary/Chest: Effort normal and breath sounds normal. No respiratory distress.  Abdominal: Soft. There is no tenderness.  Musculoskeletal: Normal range of motion. He exhibits tenderness.       Tenderness to palpation left dorsal wrist and the radial side. Snuffbox tenderness. +2 radial pulse. Cardinal hand movements intact capillary refill less than 2 seconds  Neurological: He is alert and oriented to person, place, and time. No cranial nerve deficit.  Skin: Skin is warm.  ED Course  Procedures (including critical care time)  Labs Reviewed - No data to display Dg Wrist Complete Left  10/31/2011  *RADIOLOGY REPORT*  Clinical Data: Fall down stairs, wrist pain/swelling  LEFT WRIST - COMPLETE 3+ VIEW  Comparison: 07/06/2011  Findings: Post-traumatic deformity of the distal radius.  Old nonunited ulnar styloid fracture.  No evidence of acute fracture or dislocation.  Mild soft tissue swelling.  IMPRESSION: No evidence of acute fracture or dislocation.  Post-traumatic deformity of  the distal radius/ulna.  Original Report Authenticated By: Julian Hy, M.D.     1. Wrist pain       MDM  Left wrist pain after blunt trauma. History of fracture of the same wrist. No deformity noted. Neurovascular intact.  X-ray negative for acute injury. Spica splint placed given snuffbox tenderness. Patient followup with hand surgery this week.     Ezequiel Essex, MD 10/31/11 [phone]

## 2011-11-03 ENCOUNTER — Emergency Department (HOSPITAL_COMMUNITY)
Admission: EM | Admit: 2011-11-03 | Discharge: 2011-11-03 | Disposition: A | Discharge status: Home / Self Care | Payer: Worker's Compensation | Attending: Emergency Medicine | Admitting: Emergency Medicine

## 2011-11-03 ENCOUNTER — Encounter (HOSPITAL_COMMUNITY): Payer: Self-pay

## 2011-11-03 DIAGNOSIS — Y990 Civilian activity done for income or pay: Secondary | ICD-10-CM | POA: Insufficient documentation

## 2011-11-03 DIAGNOSIS — S61419A Laceration without foreign body of unspecified hand, initial encounter: Secondary | ICD-10-CM

## 2011-11-03 DIAGNOSIS — M79609 Pain in unspecified limb: Secondary | ICD-10-CM | POA: Insufficient documentation

## 2011-11-03 DIAGNOSIS — I10 Essential (primary) hypertension: Secondary | ICD-10-CM | POA: Insufficient documentation

## 2011-11-03 DIAGNOSIS — W260XXA Contact with knife, initial encounter: Secondary | ICD-10-CM | POA: Insufficient documentation

## 2011-11-03 DIAGNOSIS — S61409A Unspecified open wound of unspecified hand, initial encounter: Secondary | ICD-10-CM | POA: Insufficient documentation

## 2011-11-03 MED ORDER — OXYCODONE-ACETAMINOPHEN 5-325 MG PO TABS: 2.0000 | ORAL_TABLET | ORAL | Status: AC | PRN

## 2011-11-03 MED ORDER — OXYCODONE-ACETAMINOPHEN 5-325 MG PO TABS
1.0000 | ORAL_TABLET | Freq: Once | ORAL | Status: AC
  Administered 2011-11-03: 1 via ORAL
  Filled 2011-11-03: qty 1

## 2011-11-03 MED ORDER — TETANUS-DIPHTHERIA TOXOIDS TD 5-2 LFU IM INJ
0.5000 mL | INJECTION | Freq: Once | INTRAMUSCULAR | Status: AC
  Administered 2011-11-03: 0.5 mL via INTRAMUSCULAR
  Filled 2011-11-03: qty 0.5

## 2011-11-03 NOTE — ED Notes (Signed)
Laceration to the lt. Palm area ,cut with a box cutter. 1 cm , bleeding controlled.

## 2011-11-03 NOTE — ED Notes (Signed)
Site closed and dressed by provider. Denies c/o or needs at present.

## 2011-11-03 NOTE — ED Provider Notes (Signed)
History     CSN: [id number]  Arrival date & time 11/03/11  9371   First MD Initiated Contact with Patient 11/03/11 [phone]      Chief Complaint  Patient presents with  . Laceration    (Consider location/radiation/quality/duration/timing/severity/associated sxs/prior treatment) HPI History provided by pt.   Pt was cutting plastic with a "razor knife" at work this morning and accidentally cut his left palm.  Rinsed out with water.  C/o severe pain that is aggravated by moving fingers. No associated paresthesias.  Last tetanus several years ago.    Past Medical History  Diagnosis Date  . Hypertension   . Syncope   . Panic disorder     Past Surgical History  Procedure Date  . Wrist surgery     Family History  Problem Relation Age of Onset  . Heart attack Father   . Diabetes Neg Hx   . Hyperlipidemia Neg Hx   . Hypertension Neg Hx     History  Substance Use Topics  . Smoking status: Never Smoker   . Smokeless tobacco: Not on file  . Alcohol Use: No      Review of Systems  All other systems reviewed and are negative.    Allergies  Hydrocodone  Home Medications   Current Outpatient Rx  Name Route Sig Dispense Refill  . IBUPROFEN 800 MG PO TABS Oral Take 1 tablet (800 mg total) by mouth 3 (three) times daily. 21 tablet 0    BP 154/102  Pulse 90  Temp(Src) 97.5 F (36.4 C) (Oral)  Resp 20  Ht $R'5\' 8"'rA$  (1.727 m)  Wt 170 lb (77.111 kg)  BMI 25.85 kg/m2  SpO2 99%  Physical Exam  Nursing note and vitals reviewed. Constitutional: He is oriented to person, place, and time. He appears well-developed and well-nourished. No distress.  HENT:  Head: Normocephalic and atraumatic.  Eyes:       Normal appearance  Neck: Normal range of motion.  Musculoskeletal:       Full ROM and 5/5 flexion strength in all 5 left fingers.  No sensory deficits.   Neurological: He is alert and oriented to person, place, and time.  Skin:       2cm, linear, subq lac on lateral side  of left palm.  Hemostatic and clean.  Ttp.    Psychiatric: He has a normal mood and affect. His behavior is normal.    ED Course  Procedures (including critical care time)  LACERATION REPAIR Performed by: Remer Macho Authorized by: Remer Macho Consent: Verbal consent obtained. Risks and benefits: risks, benefits and alternatives were discussed Consent given by: patient Patient identity confirmed: provided demographic data Prepped and Draped in normal sterile fashion Wound explored  Laceration Location: left palm  Laceration Length: 2cm  No Foreign Bodies seen or palpated  Anesthesia: local infiltration  Local anesthetic: lidocaine 2% w/out epinephrine  Anesthetic total: 5 ml  Irrigation method: syringe Amount of cleaning: standard  Skin closure: prolene 3.0  Number of sutures: 6  Technique: simple interrupted  Patient tolerance: Patient tolerated the procedure well with no immediate complications.  Labs Reviewed - No data to display No results found.   1. Laceration of palm       MDM  Pt presents w/ laceration to left palm.  Wound cleaned and sutured.  Tetanus updated.  Pt d/'cd home w/ short course of percocet for pain and referral to [facility] for suture removal.  Signs of infection discussed.  Remer Macho, [place] 11/03/11 [phone]

## 2011-11-04 ENCOUNTER — Emergency Department: Payer: Self-pay | Admitting: *Deleted

## 2011-11-04 NOTE — ED Provider Notes (Signed)
Medical screening examination/treatment/procedure(s) were performed by non-physician practitioner and as supervising physician I was immediately available for consultation/collaboration.  Virgel Manifold, MD 11/04/11 [phone]

## 2011-12-14 ENCOUNTER — Encounter (HOSPITAL_COMMUNITY): Payer: Self-pay | Admitting: Emergency Medicine

## 2011-12-14 ENCOUNTER — Emergency Department (HOSPITAL_COMMUNITY)
Admission: EM | Admit: 2011-12-14 | Discharge: 2011-12-14 | Discharge status: Against Medical Advice | Payer: Worker's Compensation | Attending: Emergency Medicine | Admitting: Emergency Medicine

## 2011-12-14 DIAGNOSIS — Z0389 Encounter for observation for other suspected diseases and conditions ruled out: Secondary | ICD-10-CM | POA: Insufficient documentation

## 2011-12-14 NOTE — ED Notes (Signed)
No answer

## 2011-12-14 NOTE — ED Notes (Signed)
No answer x1

## 2011-12-14 NOTE — ED Notes (Signed)
Hurt rt  Middle finger at work he  states

## 2012-02-03 ENCOUNTER — Emergency Department (HOSPITAL_COMMUNITY)
Admission: EM | Admit: 2012-02-03 | Discharge: 2012-02-03 | Disposition: A | Discharge status: Home / Self Care | Payer: Self-pay | Attending: Emergency Medicine | Admitting: Emergency Medicine

## 2012-02-03 ENCOUNTER — Encounter (HOSPITAL_COMMUNITY): Payer: Self-pay | Admitting: General Practice

## 2012-02-03 ENCOUNTER — Emergency Department (HOSPITAL_COMMUNITY): Payer: Self-pay

## 2012-02-03 DIAGNOSIS — I10 Essential (primary) hypertension: Secondary | ICD-10-CM | POA: Insufficient documentation

## 2012-02-03 DIAGNOSIS — M25541 Pain in joints of right hand: Secondary | ICD-10-CM

## 2012-02-03 DIAGNOSIS — M25549 Pain in joints of unspecified hand: Secondary | ICD-10-CM | POA: Insufficient documentation

## 2012-02-03 MED ORDER — OXYCODONE-ACETAMINOPHEN 5-325 MG PO TABS
1.0000 | ORAL_TABLET | Freq: Once | ORAL | Status: AC
  Administered 2012-02-03: 1 via ORAL
  Filled 2012-02-03: qty 1

## 2012-02-03 MED ORDER — TRAMADOL HCL 50 MG PO TABS: 50.0000 mg | ORAL_TABLET | Freq: Four times a day (QID) | ORAL | Status: AC | PRN

## 2012-02-03 NOTE — ED Notes (Signed)
Pt reports having (R) hand pain since a piece of shower marble was dropped on his hand.  (R) hand noted to be swollen.  pts middle finger swollen and more tender on palpation than the others.  pts pain increases with flexion and extension.

## 2012-02-03 NOTE — Discharge Instructions (Signed)
Return to the ED with any concerns including swelling of hands, numbness or weakness of your hand, discoloration of her hand or fingers, or any other alarming symptoms.

## 2012-02-03 NOTE — ED Provider Notes (Signed)
History     CSN: [id number]  Arrival date & time 02/03/12  1441   First MD Initiated Contact with Patient 02/03/12 1709      Chief Complaint  Patient presents with  . Hand Pain    (Consider location/radiation/quality/duration/timing/severity/associated sxs/prior treatment) HPI Patient presents with complaint of pain in his right hand. He states that his hand was crushed unremarkable slab approximately 5 weeks ago. He states that dorsum of his hand was bruised and painful but began to heal gradually. Over the past several days the swelling has gone down and is no longer bruised but he notes he still has pain pain is worse with making a fist and he has noted that distally to move his fingers appear more crooked than they have in the past. He denies any new injury. He has tried over-the-counter ibuprofen and Goody powders which have not provided much relief. Movement and palpation make the pain worse. He has been doing a lot of work with his hands the past few days which he thinks may have exacerbated the pain. There are no associated systemic symptoms.    Past Medical History  Diagnosis Date  . Hypertension   . Syncope   . Panic disorder     Past Surgical History  Procedure Date  . Wrist surgery     Family History  Problem Relation Age of Onset  . Heart attack Father   . Diabetes Neg Hx   . Hyperlipidemia Neg Hx   . Hypertension Neg Hx     History  Substance Use Topics  . Smoking status: Never Smoker   . Smokeless tobacco: Not on file  . Alcohol Use: No      Review of Systems ROS reviewed and all otherwise negative except for mentioned in HPI  Allergies  Hydrocodone  Home Medications   Current Outpatient Rx  Name Route Sig Dispense Refill  . ASPIRIN EC 81 MG PO TBEC Oral Take 81 mg by mouth daily.    . BC HEADACHE POWDER PO Oral Take 1 packet by mouth 3 (three) times daily as needed. As needed for pain.    . IBUPROFEN 200 MG PO TABS Oral Take 400 mg by mouth 3  (three) times daily as needed. For pain.    Marland Kitchen TRAMADOL HCL 50 MG PO TABS Oral Take 1 tablet (50 mg total) by mouth every 6 (six) hours as needed for pain. 15 tablet 0    BP 119/81  Pulse 70  Temp(Src) 97.9 F (36.6 C) (Oral)  Resp 16  SpO2 96% Vitals reviewed Physical Exam Physical Examination: General appearance - alert, well appearing, and in no distress Mental status - alert, oriented to person, place, and time Chest - clear to auscultation, no wheezes, rales or rhonchi, symmetric air entry Heart - normal rate, regular rhythm, normal S1, S2, no murmurs, rubs, clicks or gallops Musculoskeletal - ttp over 1-3 MP joints and 1-2 DIP joints, pain with flexion of fingers, 3rd distal finger with radial angulation on flexion- pt states this is new, no swelling, no anatomic snuffbox tenderness, 2+ radial pulse, fingers/hand distally NVI Extremities - peripheral pulses normal, no pedal edema, no clubbing or cyanosis Skin - normal coloration and turgor, no rashes,  ED Course  Procedures (including critical care time)  Labs Reviewed - No data to display Dg Hand Complete Right  02/03/2012  *RADIOLOGY REPORT*  Clinical Data: Crush injury 6 weeks ago with pain in the second and third metacarpals and digits  RIGHT  HAND - COMPLETE 3+ VIEW  Comparison: 08/30/2011 right third digit radiograph  Findings: Linear osseous fragment or radiopaque foreign body again noted over the palmar soft tissues of the distal phalanx of the right third digit.  Mild degenerative changes are noted at the right third digit PIP joint.  There is a linear discontinuity of trabeculation of the distal phalanx of the third digit but compared to the prior study this is stable.  No new acute fracture or dislocation.  No radiopaque foreign body otherwise.  IMPRESSION: Stable findings of predominately the distal phalanx of the right third digit but no new acute abnormality.  It is possible that the patient has refractured the third digit  distal phalanx, however, but this may not be radiographically distinguishable from the prior exam.  Original Report Authenticated By: Arline Asp, M.D.     1. Pain in joint of right hand       MDM  Patient presents with pain in his right hand which has been ongoing over the past several weeks but has been increasing. X-ray reveals chronic changes of his distal second third fingers. Patient is requesting a referral to hand specialist. This information was provided to him. I have also discussed with the patient that multiple prior x-rays have showed a similar finding and that if they're more chronic in nature there may be less availability of intervention. There is no acute injury present and his hand is neurovascularly intact. He was given pain control and information for hand followup. He was discharged with strict return precautions and is agreeable with this plan.        Threasa Beards, MD 02/03/12 [phone]

## 2012-02-03 NOTE — ED Notes (Signed)
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 He states it did bruise but he had mobility in it until recent. Pain has continued to increase and it is painful to grip with the affected hand now. States pain keeps him awake at night.

## 2012-02-05 ENCOUNTER — Emergency Department: Payer: Self-pay | Admitting: *Deleted

## 2012-02-07 ENCOUNTER — Emergency Department: Payer: Self-pay | Admitting: Emergency Medicine

## 2012-03-29 ENCOUNTER — Emergency Department: Payer: Self-pay | Admitting: Emergency Medicine

## 2012-04-04 ENCOUNTER — Emergency Department: Payer: Self-pay | Admitting: Emergency Medicine

## 2012-05-18 ENCOUNTER — Encounter (HOSPITAL_COMMUNITY): Payer: Self-pay

## 2012-05-18 ENCOUNTER — Emergency Department (HOSPITAL_COMMUNITY)
Admission: EM | Admit: 2012-05-18 | Discharge: 2012-05-18 | Disposition: A | Discharge status: Home / Self Care | Payer: Self-pay | Attending: Emergency Medicine | Admitting: Emergency Medicine

## 2012-05-18 ENCOUNTER — Emergency Department (HOSPITAL_COMMUNITY): Payer: Self-pay

## 2012-05-18 DIAGNOSIS — IMO0002 Reserved for concepts with insufficient information to code with codable children: Secondary | ICD-10-CM | POA: Insufficient documentation

## 2012-05-18 DIAGNOSIS — I10 Essential (primary) hypertension: Secondary | ICD-10-CM | POA: Insufficient documentation

## 2012-05-18 DIAGNOSIS — Z79899 Other long term (current) drug therapy: Secondary | ICD-10-CM | POA: Insufficient documentation

## 2012-05-18 DIAGNOSIS — S8391XA Sprain of unspecified site of right knee, initial encounter: Secondary | ICD-10-CM

## 2012-05-18 DIAGNOSIS — X500XXA Overexertion from strenuous movement or load, initial encounter: Secondary | ICD-10-CM | POA: Insufficient documentation

## 2012-05-18 DIAGNOSIS — Z885 Allergy status to narcotic agent status: Secondary | ICD-10-CM | POA: Insufficient documentation

## 2012-05-18 MED ORDER — NAPROXEN 500 MG PO TABS: 500.0000 mg | ORAL_TABLET | Freq: Two times a day (BID) | ORAL | Status: DC

## 2012-05-18 MED ORDER — OXYCODONE-ACETAMINOPHEN 5-325 MG PO TABS
1.0000 | ORAL_TABLET | Freq: Once | ORAL | Status: AC
  Administered 2012-05-18: 1 via ORAL
  Filled 2012-05-18: qty 1

## 2012-05-18 MED ORDER — OXYCODONE-ACETAMINOPHEN 5-325 MG PO TABS: 1.0000 | ORAL_TABLET | Freq: Four times a day (QID) | ORAL | Status: AC | PRN

## 2012-05-18 NOTE — Progress Notes (Signed)
Orthopedic Tech Progress Note Patient Details:  Connor Solomon [birth year] [id number]  Patient ID: [name], male   DOB: Mar 03, 1961, 51 y.o.   MRN: [id number] Pt stated he already has crutches;rn notified  Hildred Priest 05/18/2012, 2:10 PM

## 2012-05-18 NOTE — ED Provider Notes (Signed)
History  Scribed for Kathalene Frames, MD, the patient was seen in room TR05C/TR05C. This chart was scribed by Truddie Coco. The patient's care started at 1:46 PM   CSN: [id number]  Arrival date & time 05/18/12  1232   First MD Initiated Contact with Patient 05/18/12 1327      Chief Complaint  Patient presents with  . Knee Pain     The history is provided by the patient.   Connor Solomon is a 51 y.o. male who presents to the Emergency Department complaining of right knee pain after stepping into a hole yesterday while building a deck.  Pt has h/o similar injury several years back.  Pt reports the pain is better when his leg is straight.  He has no other injuries.  Nothing seems to make the pain better or worse.   Past Medical History  Diagnosis Date  . Hypertension   . Syncope   . Panic disorder     Past Surgical History  Procedure Date  . Wrist surgery     Family History  Problem Relation Age of Onset  . Heart attack Father   . Diabetes Neg Hx   . Hyperlipidemia Neg Hx   . Hypertension Neg Hx     History  Substance Use Topics  . Smoking status: Never Smoker   . Smokeless tobacco: Not on file  . Alcohol Use: No      Review of Systems  Constitutional: Negative for fever.       10 Systems reviewed and are negative for acute change except as noted in the HPI.  HENT: Negative for congestion.   Eyes: Negative for discharge and redness.  Respiratory: Negative for cough and shortness of breath.   Cardiovascular: Negative for chest pain.  Gastrointestinal: Negative for vomiting and abdominal pain.  Musculoskeletal: Positive for arthralgias (right knee pain). Negative for back pain.  Skin: Negative for rash.  Neurological: Negative for syncope, numbness and headaches.  Psychiatric/Behavioral:       No behavior change.    Allergies  Hydrocodone  Home Medications   Current Outpatient Rx  Name Route Sig Dispense Refill  . BC HEADACHE POWDER PO Oral Take 1 packet  by mouth 3 (three) times daily as needed. As needed for pain.    . IBUPROFEN 200 MG PO TABS Oral Take 400 mg by mouth 3 (three) times daily as needed. For pain.      BP 121/84  Pulse 105  Temp 97.6 F (36.4 C) (Oral)  Resp 16  Ht $R'5\' 8"'Ih$  (1.727 m)  Wt 165 lb (74.844 kg)  BMI 25.09 kg/m2  SpO2 99%  Physical Exam  Nursing note and vitals reviewed. Constitutional: He appears well-developed and well-nourished. No distress.  HENT:  Head: Normocephalic and atraumatic.  Right Ear: External ear normal.  Left Ear: External ear normal.  Eyes: Conjunctivae are normal. Right eye exhibits no discharge. Left eye exhibits no discharge. No scleral icterus.  Neck: Neck supple. No tracheal deviation present.  Cardiovascular: Normal rate.   Pulmonary/Chest: Effort normal. No stridor. No respiratory distress.  Musculoskeletal: He exhibits no edema.       Right knee: He exhibits decreased range of motion. He exhibits no swelling, no effusion, no ecchymosis, no deformity, no LCL laxity and normal patellar mobility.       Pain with ROM.  Increased discomfort with meniscal stress.  Distal nerve intact.   Neurological: Cranial nerve deficit:  no gross defecits noted.  Skin: Skin  is warm and dry. No rash noted.  Psychiatric: He has a normal mood and affect.    ED Course  Procedures  DIAGNOSTIC STUDIES: Oxygen Saturation is 99% on room air, normal by my interpretation.    COORDINATION OF CARE:     Labs Reviewed - No data to display Dg Knee 2 Views Right  05/18/2012  *RADIOLOGY REPORT*  Clinical Data: Anterior knee pain  RIGHT KNEE - 1-2 VIEW  Comparison: None.  Findings: Mild patellofemoral compartment degenerative change noted.  No suprapatellar effusion.  No fracture or dislocation.  No radiopaque foreign body.  IMPRESSION: No acute abnormality.  Original Report Authenticated By: Arline Asp, M.D.     1. Sprain of right knee       MDM  Consistent with sprain.  Will refer to ortho  prn I personally performed the services described in this documentation, which was scribed in my presence.  The recorded information has been reviewed and considered.         Kathalene Frames, MD 05/19/12 [phone]

## 2012-05-18 NOTE — Progress Notes (Signed)
Orthopedic Tech Progress Note Patient Details:  ABDURRAHMAN PETERSHEIM 05/12/1961 [id number]  Ortho Devices Type of Ortho Device: Knee Immobilizer Ortho Device/Splint Location: right knee Ortho Device/Splint Interventions: Application   [name] 05/18/2012, 2:10 PM

## 2012-05-18 NOTE — ED Notes (Signed)
Paged ortho 

## 2012-05-18 NOTE — ED Notes (Signed)
Pt presents with 2 day h/o R knee pain after stepping into a footing yesterday while building a deck.  Pt reports inability to bend leg due to pain, is ambulatory with keeping leg straight.

## 2012-06-05 ENCOUNTER — Emergency Department: Payer: Self-pay | Admitting: Emergency Medicine

## 2012-06-09 ENCOUNTER — Emergency Department: Payer: Self-pay | Admitting: Emergency Medicine

## 2012-06-12 ENCOUNTER — Emergency Department (HOSPITAL_COMMUNITY)
Admission: EM | Admit: 2012-06-12 | Discharge: 2012-06-12 | Disposition: A | Discharge status: Home / Self Care | Payer: Self-pay | Attending: Emergency Medicine | Admitting: Emergency Medicine

## 2012-06-12 ENCOUNTER — Encounter (HOSPITAL_COMMUNITY): Payer: Self-pay | Admitting: *Deleted

## 2012-06-12 ENCOUNTER — Emergency Department (HOSPITAL_COMMUNITY): Payer: Self-pay

## 2012-06-12 DIAGNOSIS — M545 Low back pain, unspecified: Secondary | ICD-10-CM | POA: Insufficient documentation

## 2012-06-12 DIAGNOSIS — M5459 Other low back pain: Secondary | ICD-10-CM

## 2012-06-12 DIAGNOSIS — I10 Essential (primary) hypertension: Secondary | ICD-10-CM | POA: Insufficient documentation

## 2012-06-12 MED ORDER — DIPHENHYDRAMINE HCL 25 MG PO CAPS
25.0000 mg | ORAL_CAPSULE | Freq: Once | ORAL | Status: AC
  Administered 2012-06-12: 25 mg via ORAL
  Filled 2012-06-12: qty 1

## 2012-06-12 MED ORDER — CYCLOBENZAPRINE HCL 10 MG PO TABS: 10.0000 mg | ORAL_TABLET | Freq: Two times a day (BID) | ORAL | Status: AC | PRN

## 2012-06-12 MED ORDER — KETOROLAC TROMETHAMINE 60 MG/2ML IM SOLN
30.0000 mg | Freq: Once | INTRAMUSCULAR | Status: AC
  Administered 2012-06-12: 30 mg via INTRAMUSCULAR
  Filled 2012-06-12: qty 2

## 2012-06-12 MED ORDER — ONDANSETRON 8 MG PO TBDP
8.0000 mg | ORAL_TABLET | Freq: Once | ORAL | Status: AC
  Administered 2012-06-12: 8 mg via ORAL
  Filled 2012-06-12: qty 1

## 2012-06-12 MED ORDER — TRAMADOL HCL 50 MG PO TABS: 50.0000 mg | ORAL_TABLET | Freq: Four times a day (QID) | ORAL | Status: AC | PRN

## 2012-06-12 MED ORDER — OXYCODONE-ACETAMINOPHEN 5-325 MG PO TABS: 1.0000 | ORAL_TABLET | Freq: Four times a day (QID) | ORAL | Status: AC | PRN

## 2012-06-12 MED ORDER — CYCLOBENZAPRINE HCL 10 MG PO TABS
10.0000 mg | ORAL_TABLET | Freq: Once | ORAL | Status: AC
  Administered 2012-06-12: 10 mg via ORAL
  Filled 2012-06-12: qty 1

## 2012-06-12 MED ORDER — KETOROLAC TROMETHAMINE 10 MG PO TABS: 10.0000 mg | ORAL_TABLET | Freq: Four times a day (QID) | ORAL | Status: AC | PRN

## 2012-06-12 MED ORDER — OXYCODONE-ACETAMINOPHEN 5-325 MG PO TABS
2.0000 | ORAL_TABLET | Freq: Once | ORAL | Status: AC
  Administered 2012-06-12: 2 via ORAL
  Filled 2012-06-12: qty 2

## 2012-06-12 NOTE — ED Notes (Signed)
Pt lifting riding lawn mower to change tire this morning, felt a sudden pop in lower back followed by extreme pain

## 2012-06-12 NOTE — ED Provider Notes (Signed)
History     CSN: [id number]  Arrival date & time 06/12/12  0814   First MD Initiated Contact with Patient 06/12/12 [phone]      Chief Complaint  Patient presents with  . Back Pain    (Consider location/radiation/quality/duration/timing/severity/associated sxs/prior treatment) HPI Comments: Connor Solomon 51 y.o. male   The chief complaint is: Patient presents with:   Back Pain   The patient has medical history significant for:   Past Medical History:   Hypertension                                                 Syncope                                                      Panic disorder                                              Patient with a history of multiple back sprains presents after lifting the front of a tractor/riding lawn mower. Patient states that he was not using the proper body mechanics and felt a pop. Denies constituional symptoms. Denies NVD, or abdominal pain. Denies loss of bowel or bladder control. Denies back surgeries.     The history is provided by the patient.    Past Medical History  Diagnosis Date  . Hypertension   . Syncope   . Panic disorder     Past Surgical History  Procedure Date  . Wrist surgery     Family History  Problem Relation Age of Onset  . Heart attack Father   . Diabetes Neg Hx   . Hyperlipidemia Neg Hx   . Hypertension Neg Hx     History  Substance Use Topics  . Smoking status: Never Smoker   . Smokeless tobacco: Not on file  . Alcohol Use: No      Review of Systems  Constitutional: Negative for fever, chills and diaphoresis.  Gastrointestinal: Negative for nausea, vomiting, abdominal pain and diarrhea.  Musculoskeletal: Positive for back pain and arthralgias.  All other systems reviewed and are negative.    Allergies  Hydrocodone  Home Medications   Current Outpatient Rx  Name Route Sig Dispense Refill  . IBUPROFEN 200 MG PO TABS Oral Take 400 mg by mouth 3 (three) times daily as needed. For  pain.      BP 123/86  Pulse 90  Temp 98.5 F (36.9 C) (Oral)  Resp 20  Ht $R'5\' 8"'Hw$  (1.727 m)  Wt 170 lb (77.111 kg)  BMI 25.85 kg/m2  SpO2 98%  Physical Exam  Nursing note and vitals reviewed. Constitutional: He appears well-developed and well-nourished. He appears distressed.  HENT:  Head: Normocephalic and atraumatic.  Mouth/Throat: Oropharynx is clear and moist.  Eyes: Conjunctivae and EOM are normal. No scleral icterus.  Neck: Normal range of motion. Neck supple.  Cardiovascular: Normal rate, regular rhythm and normal heart sounds.   Pulmonary/Chest: Effort normal and breath sounds normal.  Abdominal: Soft. Bowel sounds are normal. There is no tenderness.  Musculoskeletal: Normal  range of motion. He exhibits tenderness. He exhibits no edema.       Tenderness to palpation over the sacrum and lumbosacral paraspinal muscles.  Neurological: He is alert.  Skin: Skin is warm and dry.    ED Course  Procedures (including critical care time)  Labs Reviewed - No data to display Dg Lumbar Spine Complete  06/12/2012  *RADIOLOGY REPORT*  Clinical Data: Low back pain with tenderness to palpation.  Lifting injury this morning.  LUMBAR SPINE - COMPLETE 4+ VIEW  Comparison: CT 01/09/2010  Findings: Five lumbar type vertebral bodies.  Remote right transverse process trauma. Sacroiliac joints are symmetric. 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 Maintenance of vertebral body height and alignment.  IMPRESSION: Spondylosis, without acute finding.   Original Report Authenticated By: Areta Haber, M.D.      1. Mechanical low back pain       MDM  Patient presented with back pain 10/10 s/p lifting the front of a tractor. Patient given pain medication and muscle relaxer, and discharged on same. Lumbar imaging: remarkable for spondylosis but not acute fracture. Return precautions given. No red flags for spinal cord compression, fracture, or cauda  equina.        [name], PA-C 06/12/12 1028

## 2012-06-12 NOTE — ED Provider Notes (Signed)
Medical screening examination/treatment/procedure(s) were performed by non-physician practitioner and as supervising physician I was immediately available for consultation/collaboration.   Alfonzo Feller, DO 06/12/12 1909

## 2012-08-01 ENCOUNTER — Emergency Department (HOSPITAL_COMMUNITY)
Admission: EM | Admit: 2012-08-01 | Discharge: 2012-08-01 | Disposition: A | Discharge status: Home / Self Care | Payer: Self-pay | Attending: Emergency Medicine | Admitting: Emergency Medicine

## 2012-08-01 ENCOUNTER — Emergency Department (HOSPITAL_COMMUNITY): Payer: Self-pay

## 2012-08-01 ENCOUNTER — Encounter (HOSPITAL_COMMUNITY): Payer: Self-pay | Admitting: *Deleted

## 2012-08-01 DIAGNOSIS — I10 Essential (primary) hypertension: Secondary | ICD-10-CM | POA: Insufficient documentation

## 2012-08-01 DIAGNOSIS — Y9269 Other specified industrial and construction area as the place of occurrence of the external cause: Secondary | ICD-10-CM | POA: Insufficient documentation

## 2012-08-01 DIAGNOSIS — Z8669 Personal history of other diseases of the nervous system and sense organs: Secondary | ICD-10-CM | POA: Insufficient documentation

## 2012-08-01 DIAGNOSIS — X500XXA Overexertion from strenuous movement or load, initial encounter: Secondary | ICD-10-CM | POA: Insufficient documentation

## 2012-08-01 DIAGNOSIS — S63509A Unspecified sprain of unspecified wrist, initial encounter: Secondary | ICD-10-CM | POA: Insufficient documentation

## 2012-08-01 DIAGNOSIS — Y990 Civilian activity done for income or pay: Secondary | ICD-10-CM | POA: Insufficient documentation

## 2012-08-01 DIAGNOSIS — R55 Syncope and collapse: Secondary | ICD-10-CM | POA: Insufficient documentation

## 2012-08-01 MED ORDER — OXYCODONE-ACETAMINOPHEN 5-325 MG PO TABS: 1.0000 | ORAL_TABLET | Freq: Four times a day (QID) | ORAL | Status: DC | PRN

## 2012-08-01 NOTE — Progress Notes (Signed)
Orthopedic Tech Progress Note Patient Details:  Connor Solomon January 25, 1961 [id number] Velcro wrist splint applied to Left wrist. Care instructions explained. Patient comfortable in splint. Ortho Devices Type of Ortho Device: Velcro wrist splint Ortho Device/Splint Location: Left UE Ortho Device/Splint Interventions: Application   [name] 08/01/2012, 4:32 PM

## 2012-08-01 NOTE — ED Notes (Signed)
Paged ortho 

## 2012-08-01 NOTE — ED Provider Notes (Signed)
History  Scribed for [employer]. Ned Kakar, MD, the patient was seen in room TR11C/TR11C. This chart was scribed by Truddie Coco. The patient's care started at 3:57 PM   CSN: [id number]  Arrival date & time 08/01/12  1451   First MD Initiated Contact with Patient 08/01/12 1526      Chief Complaint  Patient presents with  . Wrist Pain    The history is provided by the patient. No language interpreter was used.   Connor Solomon is a 51 y.o. male who presents to the Emergency Department complaining of left wrist pain after twisting his wrist on machinery while at work earlier today.  He reports he has had an injury to this wrist before.  Nothing seems to make the sx better or worse.  Patient states that he was cutting marble and the. It caught in twisted his wrist. He states he's had wrist problems before. Past Medical History  Diagnosis Date  . Hypertension   . Syncope   . Panic disorder     Past Surgical History  Procedure Date  . Wrist surgery     Family History  Problem Relation Age of Onset  . Heart attack Father   . Diabetes Neg Hx   . Hyperlipidemia Neg Hx   . Hypertension Neg Hx     History  Substance Use Topics  . Smoking status: Never Smoker   . Smokeless tobacco: Not on file  . Alcohol Use: No      Review of Systems  Musculoskeletal: Positive for arthralgias (left wrist pain).  All other systems reviewed and are negative.    Allergies  Hydrocodone  Home Medications  No current outpatient prescriptions on file.  BP 144/95  Pulse 92  Temp 98.4 F (36.9 C) (Oral)  Resp 20  SpO2 98%  Physical Exam  Nursing note and vitals reviewed. Constitutional: He is oriented to person, place, and time. He appears well-developed and well-nourished. No distress.  HENT:  Head: Normocephalic and atraumatic.  Eyes: EOM are normal. Pupils are equal, round, and reactive to light.  Neck: Neck supple. No tracheal deviation present.  Pulmonary/Chest: Effort  normal. No respiratory distress.  Musculoskeletal: Normal range of motion. He exhibits no edema.       Dorsal tenderness on the left wrist.  Pain with ROM.  No edema.  Decreased sensation over the left hand.  Normal ROM of the left elbow.     Neurological: He is alert and oriented to person, place, and time. No sensory deficit.  Skin: Skin is warm and dry.  Psychiatric: He has a normal mood and affect. His behavior is normal.    ED Course  Procedures   DIAGNOSTIC STUDIES:   COORDINATION OF CARE:  3:18 Ordered: DG Wrist complete Left   3:59 PM Discussed sprain of the left wrist and course of care including an immobilizer and pain medication.  Pt understands and agrees.    Labs Reviewed - No data to display Dg Wrist Complete Left  08/01/2012  *RADIOLOGY REPORT*  Clinical Data: Injured using drill with pain distally  LEFT WRIST - COMPLETE 3+ VIEW  Comparison: Left wrist films of 10/31/2011  Findings: Deformity and angulation of the distal left radius is apparently due to prior fracture.  No acute fracture is seen. Probable old fracture of the ulnar styloid also is noted.  There is some degenerative change at the radiocarpal joint space.  A slightly widened scapholunate distance appears chronic and may indicate chronic ligamentous  injury.  IMPRESSION:  1.  No acute fracture.  Deformity secondary to prior fracture with healing. 2.  Somewhat widened scapholunate distance appears chronic and may indicate chronic ligamentous injury.   Original Report Authenticated By: Joretta Bachelor, M.D.      No diagnosis found.    MDM  Patient with left wrist injury. May have chronic ligamentous injury. No acute fracture. Patient will be splinted and will followup with hand surgery as needed. He drove here so was not given pain meds here.  I personally performed the services described in this documentation, which was scribed in my presence. The recorded information has been reviewed and  considered.        Jasper Riling. Alvino Chapel, MD 08/01/12 [phone]

## 2012-08-01 NOTE — ED Notes (Signed)
PT reinjured left wrist when working at [employer]

## 2012-08-07 ENCOUNTER — Emergency Department: Payer: Self-pay | Admitting: Emergency Medicine

## 2012-08-08 ENCOUNTER — Emergency Department (HOSPITAL_COMMUNITY)
Admission: EM | Admit: 2012-08-08 | Discharge: 2012-08-08 | Disposition: A | Discharge status: Home / Self Care | Payer: No Typology Code available for payment source | Attending: Emergency Medicine | Admitting: Emergency Medicine

## 2012-08-08 ENCOUNTER — Encounter (HOSPITAL_COMMUNITY): Payer: Self-pay | Admitting: Emergency Medicine

## 2012-08-08 ENCOUNTER — Emergency Department (HOSPITAL_COMMUNITY): Payer: No Typology Code available for payment source

## 2012-08-08 DIAGNOSIS — Z8659 Personal history of other mental and behavioral disorders: Secondary | ICD-10-CM | POA: Insufficient documentation

## 2012-08-08 DIAGNOSIS — I10 Essential (primary) hypertension: Secondary | ICD-10-CM | POA: Insufficient documentation

## 2012-08-08 DIAGNOSIS — Y9389 Activity, other specified: Secondary | ICD-10-CM | POA: Insufficient documentation

## 2012-08-08 DIAGNOSIS — S63502A Unspecified sprain of left wrist, initial encounter: Secondary | ICD-10-CM

## 2012-08-08 DIAGNOSIS — S63509A Unspecified sprain of unspecified wrist, initial encounter: Secondary | ICD-10-CM | POA: Insufficient documentation

## 2012-08-08 MED ORDER — IBUPROFEN 800 MG PO TABS
800.0000 mg | ORAL_TABLET | Freq: Once | ORAL | Status: AC
  Administered 2012-08-08: 800 mg via ORAL
  Filled 2012-08-08: qty 1

## 2012-08-08 MED ORDER — NAPROXEN 500 MG PO TABS: 500.0000 mg | ORAL_TABLET | Freq: Two times a day (BID) | ORAL | Status: DC
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
## 2012-08-08 NOTE — ED Provider Notes (Signed)
Medical screening examination/treatment/procedure(s) were performed by non-physician practitioner and as supervising physician I was immediately available for consultation/collaboration.  Kalman Drape, MD 08/08/12 [phone]

## 2012-08-09 ENCOUNTER — Encounter (HOSPITAL_COMMUNITY): Payer: Self-pay | Admitting: Emergency Medicine

## 2012-08-09 ENCOUNTER — Emergency Department (HOSPITAL_COMMUNITY)
Admission: EM | Admit: 2012-08-09 | Discharge: 2012-08-09 | Disposition: A | Discharge status: Home / Self Care | Payer: Self-pay | Attending: Emergency Medicine | Admitting: Emergency Medicine

## 2012-08-09 DIAGNOSIS — M25539 Pain in unspecified wrist: Secondary | ICD-10-CM | POA: Insufficient documentation

## 2012-08-09 DIAGNOSIS — S66919A Strain of unspecified muscle, fascia and tendon at wrist and hand level, unspecified hand, initial encounter: Secondary | ICD-10-CM

## 2012-08-09 DIAGNOSIS — I10 Essential (primary) hypertension: Secondary | ICD-10-CM | POA: Insufficient documentation

## 2012-08-09 DIAGNOSIS — Y9269 Other specified industrial and construction area as the place of occurrence of the external cause: Secondary | ICD-10-CM | POA: Insufficient documentation

## 2012-08-09 DIAGNOSIS — Z9889 Other specified postprocedural states: Secondary | ICD-10-CM | POA: Insufficient documentation

## 2012-08-09 DIAGNOSIS — Y990 Civilian activity done for income or pay: Secondary | ICD-10-CM | POA: Insufficient documentation

## 2012-08-09 DIAGNOSIS — G8929 Other chronic pain: Secondary | ICD-10-CM

## 2012-08-09 DIAGNOSIS — Z8669 Personal history of other diseases of the nervous system and sense organs: Secondary | ICD-10-CM | POA: Insufficient documentation

## 2012-08-09 DIAGNOSIS — W208XXA Other cause of strike by thrown, projected or falling object, initial encounter: Secondary | ICD-10-CM | POA: Insufficient documentation

## 2012-08-09 DIAGNOSIS — Y9389 Activity, other specified: Secondary | ICD-10-CM | POA: Insufficient documentation

## 2012-08-09 DIAGNOSIS — S63509A Unspecified sprain of unspecified wrist, initial encounter: Secondary | ICD-10-CM

## 2012-08-09 DIAGNOSIS — Z791 Long term (current) use of non-steroidal anti-inflammatories (NSAID): Secondary | ICD-10-CM | POA: Insufficient documentation

## 2012-08-09 DIAGNOSIS — Z8659 Personal history of other mental and behavioral disorders: Secondary | ICD-10-CM | POA: Insufficient documentation

## 2012-08-09 MED ORDER — OXYCODONE-ACETAMINOPHEN 7.5-325 MG PO TABS: 1.0000 | ORAL_TABLET | Freq: Four times a day (QID) | ORAL | Status: DC | PRN
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
## 2012-08-09 NOTE — ED Notes (Signed)
Pt has chronic left wrist pain. States fell onto left wrist today.

## 2012-09-14 ENCOUNTER — Emergency Department (HOSPITAL_COMMUNITY)
Admission: EM | Admit: 2012-09-14 | Discharge: 2012-09-14 | Disposition: A | Discharge status: Home / Self Care | Payer: Self-pay | Attending: Emergency Medicine | Admitting: Emergency Medicine

## 2012-09-14 ENCOUNTER — Emergency Department (HOSPITAL_COMMUNITY): Payer: Self-pay

## 2012-09-14 ENCOUNTER — Encounter (HOSPITAL_COMMUNITY): Payer: Self-pay | Admitting: Emergency Medicine

## 2012-09-14 DIAGNOSIS — X58XXXA Exposure to other specified factors, initial encounter: Secondary | ICD-10-CM | POA: Insufficient documentation

## 2012-09-14 DIAGNOSIS — S6390XA Sprain of unspecified part of unspecified wrist and hand, initial encounter: Secondary | ICD-10-CM

## 2012-09-14 DIAGNOSIS — Y9389 Activity, other specified: Secondary | ICD-10-CM | POA: Insufficient documentation

## 2012-09-14 DIAGNOSIS — I10 Essential (primary) hypertension: Secondary | ICD-10-CM | POA: Insufficient documentation

## 2012-09-14 DIAGNOSIS — Y9269 Other specified industrial and construction area as the place of occurrence of the external cause: Secondary | ICD-10-CM | POA: Insufficient documentation

## 2012-09-14 DIAGNOSIS — M2550 Pain in unspecified joint: Secondary | ICD-10-CM | POA: Insufficient documentation

## 2012-09-14 DIAGNOSIS — Z8659 Personal history of other mental and behavioral disorders: Secondary | ICD-10-CM | POA: Insufficient documentation

## 2012-09-14 DIAGNOSIS — IMO0001 Reserved for inherently not codable concepts without codable children: Secondary | ICD-10-CM

## 2012-09-14 MED ORDER — OXYCODONE-ACETAMINOPHEN 5-325 MG PO TABS: ORAL_TABLET | ORAL | Status: DC

## 2012-09-14 MED ORDER — OXYCODONE-ACETAMINOPHEN 5-325 MG PO TABS
1.0000 | ORAL_TABLET | Freq: Once | ORAL | Status: AC
  Administered 2012-09-14: 1 via ORAL
  Filled 2012-09-14: qty 1

## 2012-09-14 MED ORDER — IBUPROFEN 400 MG PO TABS
600.0000 mg | ORAL_TABLET | Freq: Once | ORAL | Status: AC
  Administered 2012-09-14: 600 mg via ORAL
  Filled 2012-09-14: qty 1

## 2012-09-14 NOTE — ED Provider Notes (Signed)
History   This chart was scribed for Saddie Benders. Dorna Mai, MD by Kathreen Cornfield, ED Scribe. The patient was seen in room TR07C/TR07C and the patient's care was started at 1:16PM.     CSN: [id number]  Arrival date & time 09/14/12  1252   First MD Initiated Contact with Patient 09/14/12 1316      Chief Complaint  Patient presents with  . Finger Injury    (Consider location/radiation/quality/duration/timing/severity/associated sxs/prior treatment) The history is provided by the patient. No language interpreter was used.    Connor Solomon is a 51 y.o. male , who presents to the Emergency Department complaining of sudden, progressively worsening, finger injury located at the right hand, middle finger, onset today (09/14/12 at 9:00AM). 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 The pt had applied a splint prior to arrival at [facility] which provides moderate relief of the pain associated with the finger injury.  The pt does not smoke or drink alcohol.      Past Medical History  Diagnosis Date  . Hypertension   . Syncope   . Panic disorder     Past Surgical History  Procedure Date  . Wrist surgery     Family History  Problem Relation Age of Onset  . Heart attack Father   . Diabetes Neg Hx   . Hyperlipidemia Neg Hx   . Hypertension Neg Hx     History  Substance Use Topics  . Smoking status: Never Smoker   . Smokeless tobacco: Not on file  . Alcohol Use: No      Review of Systems  Constitutional: Negative.   Musculoskeletal: Positive for joint swelling and arthralgias.  Skin: Negative for color change and wound.  Neurological: Negative for weakness and numbness.    Allergies  Hydrocodone  Home Medications   Current Outpatient Rx  Name  Route  Sig  Dispense  Refill  . OXYCODONE-ACETAMINOPHEN 5-325 MG PO TABS      1-2 tablets po q 6 hours prn moderate to severe pain   15 tablet   0      BP 130/88  Pulse 61  Temp 97.9 F (36.6 C) (Oral)  Resp 18  SpO2 93%  Physical Exam  Nursing note and vitals reviewed. Constitutional: He appears well-developed and well-nourished. No distress.  HENT:  Head: Atraumatic.  Nose: Nose normal.  Neck: Normal range of motion. Neck supple.  Pulmonary/Chest: Effort normal.  Musculoskeletal:       Right hand: He exhibits decreased range of motion, tenderness and deformity. He exhibits normal capillary refill and no laceration. normal sensation noted.       Right DIP joint deviation. Limited ROM. Capillary refill normal. Gross sensation normal. No laceration or abrasions. Diffuse tenderness involving middle three digits extending into metacarpals.   Neurological: He is alert. He has normal strength. No sensory deficit. Coordination normal.  Skin: Skin is warm and dry. He is not diaphoretic.    ED Course  Procedures (including critical care time)  DIAGNOSTIC STUDIES: Oxygen Saturation is 93% on room air, adequate by my interpretation.    COORDINATION OF CARE:  1:40 PM- Treatment plan discussed with patient. Pt agrees with treatment.      Labs Reviewed - No data to display Dg Hand Complete Right  09/14/2012  *RADIOLOGY REPORT*  Clinical Data: Pain post trauma  RIGHT HAND - COMPLETE 3+ VIEW  Comparison: February 03, 2012  Findings:  Frontal, oblique, and  lateral views were obtained. There is a persistent radiopaque foreign body in the soft tissues volar to the midportion of the third distal phalanx.  No new radiopaque foreign body seen.  There is no fracture or dislocation.  There is moderate osteoarthritic change in the third DIP joint. There is mild osteoarthritic change in the second and third MCP joints.  No erosive change.  IMPRESSION: No acute fracture or dislocation.  Stable osteoarthritic change, most notably in the third DIP joint.   Stable radiopaque foreign body volar to the third distal phalanx.  No appreciable change from  prior study.  Comment:  The linear lucency in the third distal phalanx is stable and probably represents a prominent nutrient foramen.   Original Report Authenticated By: Lowella Grip, M.D.      1. Strain of third finger of right hand   2. Hand sprain    I reviewed plain films myself.  No fracture seen of multiple view hand film.     MDM  I personally performed the services described in this documentation, which was scribed in my presence. The recorded information has been reviewed and is accurate.     Saddie Benders. Haylen Bellotti, MD 09/14/12 1546

## 2012-09-14 NOTE — ED Notes (Signed)
Pt c/o right middle finger injury at work; pt with deformity noted

## 2012-09-14 NOTE — Discharge Instructions (Signed)
Finger Sprain  A finger sprain is a tear in one of the strong, fibrous tissues that connect the bones (ligaments) in your finger. The severity of the sprain depends on how much of the ligament is torn. The tear can be either partial or complete.  CAUSES   Often, sprains are a result of a fall or accident. If you extend your hands to catch an object or to protect yourself, the force of the impact causes the fibers of your ligament to stretch too much. This excess tension causes the fibers of your ligament to tear.  SYMPTOMS   You may have some loss of motion in your finger. Other symptoms include:   Bruising.   Tenderness.   Swelling.  DIAGNOSIS   In order to diagnose finger sprain, your caregiver will physically examine your finger or thumb to determine how torn the ligament is. Your caregiver may also suggest an X-ray exam of your finger to make sure no bones are broken.  TREATMENT   If your ligament is only partially torn, treatment usually involves keeping the finger in a fixed position (immobilization) for a short period. To do this, your caregiver will apply a bandage, cast, or splint to keep your finger from moving until it heals. For a partially torn ligament, the healing process usually takes 2 to 3 weeks.  If your ligament is completely torn, you may need surgery to reconnect the ligament to the bone. After surgery a cast or splint will be applied and will need to stay on your finger or thumb for 4 to 6 weeks while your ligament heals.  HOME CARE INSTRUCTIONS   Keep your injured finger elevated, when possible, to decrease swelling.   To ease pain and swelling, apply ice to your joint twice a day, for 2 to 3 days:   Put ice in a plastic bag.   Place a towel between your skin and the bag.   Leave the ice on for 15 minutes.   Only take over-the-counter or prescription medicine for pain as directed by your caregiver.   Do not wear rings on your injured finger.   Do not leave your finger unprotected  until pain and stiffness go away (usually 3 to 4 weeks).   Do not allow your cast or splint to get wet. Cover your cast or splint with a plastic bag when you shower or bathe. Do not swim.   Your caregiver may suggest special exercises for you to do during your recovery to prevent or limit permanent stiffness.  SEEK IMMEDIATE MEDICAL CARE IF:   Your cast or splint becomes damaged.   Your pain becomes worse rather than better.  MAKE SURE YOU:   Understand these instructions.   Will watch your condition.   Will get help right away if you are not doing well or get worse.  Document Released: 11/04/2004 Document Revised: 12/20/2011 Document Reviewed: 05/31/2011  ExitCare Patient Information 2013 [employer].

## 2012-10-11 ENCOUNTER — Emergency Department (HOSPITAL_COMMUNITY): Payer: Self-pay

## 2012-10-11 ENCOUNTER — Encounter (HOSPITAL_COMMUNITY): Payer: Self-pay | Admitting: *Deleted

## 2012-10-11 ENCOUNTER — Emergency Department (HOSPITAL_COMMUNITY)
Admission: EM | Admit: 2012-10-11 | Discharge: 2012-10-11 | Disposition: A | Discharge status: Home / Self Care | Payer: Self-pay | Attending: Emergency Medicine | Admitting: Emergency Medicine

## 2012-10-11 DIAGNOSIS — I10 Essential (primary) hypertension: Secondary | ICD-10-CM | POA: Insufficient documentation

## 2012-10-11 DIAGNOSIS — M25539 Pain in unspecified wrist: Secondary | ICD-10-CM

## 2012-10-11 DIAGNOSIS — W19XXXA Unspecified fall, initial encounter: Secondary | ICD-10-CM

## 2012-10-11 DIAGNOSIS — S59909A Unspecified injury of unspecified elbow, initial encounter: Secondary | ICD-10-CM | POA: Insufficient documentation

## 2012-10-11 DIAGNOSIS — Z8659 Personal history of other mental and behavioral disorders: Secondary | ICD-10-CM | POA: Insufficient documentation

## 2012-10-11 DIAGNOSIS — Z79899 Other long term (current) drug therapy: Secondary | ICD-10-CM | POA: Insufficient documentation

## 2012-10-11 DIAGNOSIS — S6990XA Unspecified injury of unspecified wrist, hand and finger(s), initial encounter: Secondary | ICD-10-CM | POA: Insufficient documentation

## 2012-10-11 DIAGNOSIS — W108XXA Fall (on) (from) other stairs and steps, initial encounter: Secondary | ICD-10-CM | POA: Insufficient documentation

## 2012-10-11 DIAGNOSIS — Y929 Unspecified place or not applicable: Secondary | ICD-10-CM | POA: Insufficient documentation

## 2012-10-11 DIAGNOSIS — Z87828 Personal history of other (healed) physical injury and trauma: Secondary | ICD-10-CM | POA: Insufficient documentation

## 2012-10-11 DIAGNOSIS — Y9301 Activity, walking, marching and hiking: Secondary | ICD-10-CM | POA: Insufficient documentation

## 2012-10-11 DIAGNOSIS — G8929 Other chronic pain: Secondary | ICD-10-CM

## 2012-10-11 MED ORDER — OXYCODONE-ACETAMINOPHEN 5-325 MG PO TABS: 1.0000 | ORAL_TABLET | ORAL | Status: DC | PRN

## 2012-10-11 NOTE — Progress Notes (Signed)
Orthopedic Tech Progress Note Patient Details:  Connor Solomon [birth year] [id number] Left Velcro thumb spica applied. Application tolerated well.  Ortho Devices Type of Ortho Device: Thumb velcro splint Ortho Device/Splint Location: Left Velcro Thumb spica Ortho Device/Splint Interventions: Application   [name] 10/11/2012, 12:27 PM

## 2012-10-11 NOTE — ED Provider Notes (Signed)
Medical screening examination/treatment/procedure(s) were performed by non-physician practitioner and as supervising physician I was immediately available for consultation/collaboration.   Richarda Blade, MD 10/11/12 [phone]

## 2012-10-11 NOTE — ED Provider Notes (Signed)
History     CSN: [id number]  Arrival date & time 10/11/12  1004   First MD Initiated Contact with Patient 10/11/12 1119      Chief Complaint  Patient presents with  . Wrist Pain    (Consider location/radiation/quality/duration/timing/severity/associated sxs/prior treatment) The history is provided by the patient and medical records.    Connor Solomon is a 52 y.o. male  with a hx of wrist injury and chronic pain presents to the Emergency Department complaining of acute, persistent, progressively worsening L wrist pain onset this morning after a [place]. Patient states he slipped while walking down the stairs falling backward and catching himself with his left hand. He denies loss of consciousness, hitting his head, neck pain, back pain, chest pain, abdominal pain.  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 He also states that he was seen by an orthopedist coming does not room for which 1 and was unable to follow through with the treatment plan based on the expense. Associated symptoms include pain, decreased ROM and swelling.  Nothing makes it better and movement, palpation makes it worse.     Past Medical History  Diagnosis Date  . Hypertension   . Syncope   . Panic disorder     Past Surgical History  Procedure Date  . Wrist surgery     Family History  Problem Relation Age of Onset  . Heart attack Father   . Diabetes Neg Hx   . Hyperlipidemia Neg Hx   . Hypertension Neg Hx     History  Substance Use Topics  . Smoking status: Never Smoker   . Smokeless tobacco: Not on file  . Alcohol Use: No      Review of Systems  HENT: Negative for neck pain and neck stiffness.   Musculoskeletal: Positive for joint swelling and arthralgias. Negative for back pain.  Skin: Negative for wound.  Neurological: Negative for numbness.  All other systems reviewed and are negative.    Allergies  Hydrocodone  Home Medications   Current Outpatient Rx  Name   Route  Sig  Dispense  Refill  . IBUPROFEN 200 MG PO TABS   Oral   Take 200 mg by mouth every 6 (six) hours as needed. For pain         . OXYCODONE-ACETAMINOPHEN 5-325 MG PO TABS   Oral   Take 1 tablet by mouth every 4 (four) hours as needed for pain.   12 tablet   0     BP 150/88  Pulse 87  Temp 98.2 F (36.8 C) (Oral)  Resp 20  SpO2 100%  Physical Exam  Nursing note and vitals reviewed. Constitutional: He appears well-developed and well-nourished. No distress.  HENT:  Head: Normocephalic and atraumatic.  Eyes: Conjunctivae normal are normal.  Neck: Normal range of motion and full passive range of motion without pain. No spinous process tenderness and no muscular tenderness present.  Cardiovascular: Normal rate, regular rhythm, normal heart sounds and intact distal pulses.        Capillary refill less than 3 seconds  Pulmonary/Chest: Effort normal and breath sounds normal. No respiratory distress. He has no wheezes.  Musculoskeletal: He exhibits tenderness. He exhibits no edema.       Left wrist: He exhibits decreased range of motion (mildly decreased secondary to pain) and tenderness. He exhibits no bony tenderness, no swelling, no effusion, no crepitus, no deformity and no laceration.  ROM: Full range of motion with pain including inversion and eversion; full range of motion of the fingers and elbow as well  No tenderness to palpation of the spinous processes or paraspinal muscles of the T-spine or L-spine.  Neurological: He is alert. No sensory deficit. Coordination normal. GCS eye subscore is 4. GCS verbal subscore is 5. GCS motor subscore is 6.  Reflex Scores:      Tricep reflexes are 2+ on the right side and 2+ on the left side.      Bicep reflexes are 2+ on the right side and 2+ on the left side.      Brachioradialis reflexes are 2+ on the right side and 2+ on the left side.      Sensation intact to dull and sharp Strength 5/5 in the fingers and elbow, 4/5 in  the wrist secondary to pain  Skin: Skin is warm and dry. He is not diaphoretic.    ED Course  Procedures (including critical care time)  Labs Reviewed - No data to display Dg Wrist Complete Left  10/11/2012  *RADIOLOGY REPORT*  Clinical Data: Pain in the wrist.  Fall.  LEFT WRIST - COMPLETE 3+ VIEW  Comparison: 08/08/2012  Findings: The there is deformity of the distal radius, consistent with old fracture.  There are degenerative changes at the radiocarpal joint.  Old ulnar styloid fracture is also present. Mild widening of the scapholunate distance appears stable.  There is no evidence for acute fracture or dislocation.  IMPRESSION:  1.  Post-traumatic chronic changes. 2.  No evidence for acute abnormality.   Original Report Authenticated By: Nolon Nations, M.D.      1. Wrist pain, chronic   2. WRIST PAIN, LEFT   3. [place] presents after fall with left wrist pain. Patient with chronic left wrist pain.  X-ray with deformity of the distal radius consistent with an old fracture, degenerative changes to the radiocarpal joint and stable widening of the scapholunate. There is no evidence for acute abnormality today.  Discussed these findings with the patient. We'll treat conservatively with pain medication and a thumb spica splint. I discussed the fact the patient should be wearing the splint at all times and not just during work to prevent injury to his wrist outside work.  Also recommended that he followup with the orthopedist, Dr. Amedeo Plenty, that he saw the first time.  1. Medications: Vicodin, usual home medications 2. Treatment: rest, drink plenty of fluids, take medications as prescribed 3. Follow Up: Please followup with your primary doctor for discussion of your diagnoses and further evaluation after today's visit; if you do not have a primary care doctor use the resource guide provided to find one; followup with orthopedics for further evaluation of the wrist and  discussion of other options for treatment         Abigail Butts, PA-C 10/11/12 1145

## 2012-10-11 NOTE — ED Notes (Signed)
Pt discharged home. Has no further questions at the time.

## 2012-10-11 NOTE — ED Notes (Signed)
To ED for eval of left wrist pain since falling yesterday. Left radial pulse palpable and strong

## 2012-10-14 ENCOUNTER — Emergency Department: Payer: Self-pay | Admitting: Emergency Medicine

## 2012-10-17 ENCOUNTER — Emergency Department (HOSPITAL_COMMUNITY)
Admission: EM | Admit: 2012-10-17 | Discharge: 2012-10-17 | Disposition: A | Discharge status: Home / Self Care | Payer: Self-pay | Attending: Emergency Medicine | Admitting: Emergency Medicine

## 2012-10-17 ENCOUNTER — Encounter (HOSPITAL_COMMUNITY): Payer: Self-pay | Admitting: Emergency Medicine

## 2012-10-17 DIAGNOSIS — Z8709 Personal history of other diseases of the respiratory system: Secondary | ICD-10-CM | POA: Insufficient documentation

## 2012-10-17 DIAGNOSIS — I10 Essential (primary) hypertension: Secondary | ICD-10-CM | POA: Insufficient documentation

## 2012-10-17 DIAGNOSIS — M549 Dorsalgia, unspecified: Secondary | ICD-10-CM

## 2012-10-17 DIAGNOSIS — Z8669 Personal history of other diseases of the nervous system and sense organs: Secondary | ICD-10-CM | POA: Insufficient documentation

## 2012-10-17 DIAGNOSIS — M545 Low back pain, unspecified: Secondary | ICD-10-CM | POA: Insufficient documentation

## 2012-10-17 DIAGNOSIS — Z8659 Personal history of other mental and behavioral disorders: Secondary | ICD-10-CM | POA: Insufficient documentation

## 2012-10-17 MED ORDER — PREDNISONE 20 MG PO TABS: 40.0000 mg | ORAL_TABLET | Freq: Every day | ORAL | Status: DC

## 2012-10-17 MED ORDER — OXYCODONE-ACETAMINOPHEN 5-325 MG PO TABS: 2.0000 | ORAL_TABLET | ORAL | Status: DC | PRN

## 2012-10-17 MED ORDER — DIAZEPAM 5 MG PO TABS
5.0000 mg | ORAL_TABLET | Freq: Once | ORAL | Status: AC
  Administered 2012-10-17: 5 mg via ORAL
  Filled 2012-10-17: qty 1

## 2012-10-17 MED ORDER — TRAMADOL HCL 50 MG PO TABS: 50.0000 mg | ORAL_TABLET | Freq: Four times a day (QID) | ORAL | Status: DC | PRN

## 2012-10-17 MED ORDER — KETOROLAC TROMETHAMINE 60 MG/2ML IM SOLN
60.0000 mg | Freq: Once | INTRAMUSCULAR | Status: AC
  Administered 2012-10-17: 60 mg via INTRAMUSCULAR
  Filled 2012-10-17: qty 2

## 2012-10-17 NOTE — Discharge Instructions (Signed)
Take Prednisone as directed until gone. Take Tramadol as needed for pain. Refer to attached documents for more information regarding your diagnosis. Follow up with a medical provider from the list below for further evaluation and management of your symptoms.   RESOURCE GUIDE  Chronic Pain Problems: Contact [facility]  [phone] Patients need to be referred by their primary care doctor.  Insufficient Money for Medicine: Contact [employer] Way:  call "211."   No Primary Care Doctor: - Call Health Connect  [phone] - can help you locate a primary care doctor that  accepts your insurance, provides certain services, etc. - Physician Referral Service- [phone]  Agencies that provide inexpensive medical care: - [facility]  [phone] - [facility]  [phone] - [facility]  [phone] - Women's Clinic  [phone] - Planned Parenthood  [phone] - [facility]  [phone]  Medicaid-accepting [facility] Providers: - [facility]- [address]  [phone], Mon-Fri 9am-7pm, Sat 9am-1pm - [facility]- [address] [place], Suite [place]  [phone] - [facility]- [address] Suite [place]  [phone] [facility]- [address]  [phone] - Renaye Rakers- [address] [place], [address], [phone]  Only accepts [place] patients after they have their name  applied to their card  Self Pay (no insurance) in [place]: - Sickle Cell Patients: Dr Willey Blade, [facility]  [address] [place], [phone] - [facility]- [address] [place]  [phone]       [facility] [place]- [address]       -     [facility]- see information above (Speak to [employer] if you do not have insurance)       -  [facility]- [address],  [phone]       -   [facility]- [address], [phone]       -  Dr Julio Sicks-  [address], [place], [phone]       -  Urgent Medical and [facility] - [address] [phone]       -  [facility]- [address], [phone], also [address] [phone]       -    [facility]- [address] [place], [phone], 1st & 3rd Saturday        every month, 10am-1pm  1) Find a Doctor and Pay Out of Pocket Although you won't have to find out who is covered by your insurance plan, it is a good idea to ask around and get recommendations. You will then need to call the office and see if the doctor you have chosen will accept you as a new patient and what types of options they offer for patients who are self-pay. Some doctors offer discounts or will set up payment plans for their patients who do not have insurance, but you will need to ask so you aren't surprised when you get to your appointment.  2) Contact Your Local Health Department Not all health departments have doctors that can see patients for sick visits, but many do, so it is worth a call to see if yours does. If you don't know where your local health department  is, you can check in your phone book. The CDC also has a tool to help you locate your state's health department, and many state websites also have listings of all of their local health departments.  3) Find a [facility] If your illness is not likely to be very severe or complicated, you may want to try a walk in clinic. These are popping up all over the country in pharmacies, drugstores, and shopping centers. They're usually staffed by nurse practitioners or physician assistants that have been trained to treat common illnesses and complaints. They're usually fairly quick and inexpensive. However, if you have serious medical issues or chronic medical problems, these are probably not your best option  STD Testing - [place], [facility], [address], [place], phone [phone] or [phone].  Monday - Friday, call for an appointment. - [place], [facility] Clinic, [address], [place], phone [phone] or [phone].  Monday - Friday, call for an appointment.  Abuse/Neglect: - [place] [phone] - [place] [phone] (After Hours)  Emergency Shelter:  [facility] [phone]  Maternity Homes: - Room at the [place] [phone] - [place] [phone]  MRSA Hotline #:   (929)145-5464  [facility] Dept. [address]         [place] Phone:  [phone]                                  Phone:  [phone]                   Phone:  [phone]  [facility], [place] in [place], [address],             [phone], [place] [phone] or [phone] (After Hours)  Dental Assistance  If unable to pay or uninsured, contact:  [facility] to become qualified for the adult dental clinic.  Patients with Medicaid: [facility] [phone] [address], [place] [address] [phone]  If unable to pay, or uninsured, contact [facility] [phone] in [place], [place] in [facility]) to become qualified for the adult dental clinic  Other [place]- [place], [place],  [place], [address], [place], [place], 2nd and 4th Thursday of the month at 6:30am.  10 clients each day by appointment, can sometimes see walk-in patients if  someone does not show for an appointment. [facility]- [address] [place], [place], [address], [place], [place], [place], [address], [place] Department- [place] Department- [place] in the [facility]  Intensive Outpatient Programs: [facility]      [place]. [place], [place] Both a day and evening program       [facility] Outpatient     [address]        [place], [place] [address] [phone]         ADS: Alcohol & Drug Svcs [place]: [phone] or [phone] [address] [place], [place] [address] [email or web address]  Behavioral Health Services  Substance Abuse Resources: - Alcohol and Drug Services  [phone] - [place] [phone] - The [place] [phone] Chinita Pester [phone] - Residential & Outpatient Substance Abuse Program  [phone]  Psychological Services: - [place]  [place]  [place], [place] [address] [place], [place]: [phone] or [phone], [email or web address]  Mobile Crisis Teams:                                        [facility]rapeutic Alternatives         Mobile Crisis Care Unit [phone]             [facility] [address] [place] [address] [place] [phone]  Self-Help/Support Groups: [place]. of [employer] of support groups 7175583197 (call for more info)   Narcotics Anonymous (NA) Caring Services [address] [place] - 2 meetings at this location  Residential Treatment Programs:  [place]       [place] [address], [place], [place]  [address] [place]  [address] [place], [place] [address] [phone] Admissions: 8am-3pm M-F  Incentives Substance [place]     [address] [place],  [address]       [phone]         [facility] [address] Jadene Pierini [place], [place]  [facility] [address] [place], [place]  [facility] Programs - Intensive Outpatient      [address]     [place], [place]         [facility] ([place]     [address] [place], [place] or [phone]  Residential Treatment Services (RTS), Medicaid [address] [place], [place]  [facility] [place]  [place]: [place]- [phone]               General Therapy                                                Domenic Schwab, PhD        [address] [place], [place] [address]         [facility]   [address] [place], [place] [address] [phone]  [facility] Recovery [address] [place], [place] [address] [phone] Insurance/Medicaid/sponsorship through [facility] and Families                                              [address] [place]                                         [place], [place] [address]    Therapy/tele-psych/case         [place] [address], [place]  [address]  Adolescent/group home/case management [phone]                                           Rosette Reveal PhD       General therapy       Insurance   [phone]         Dr. Adele Schilder, Insurance, M-F [phone]

## 2012-10-17 NOTE — ED Notes (Signed)
Pt c/o lower back pain starting yesterday; pt sts hx of similar in past; pt denies obvious injury; pt sts recently had URI

## 2012-10-17 NOTE — ED Notes (Signed)
Pt states "the only medicine that works for me is percocet." PA notified, prescription changed.

## 2012-10-17 NOTE — ED Provider Notes (Signed)
History     CSN: [id number]  Arrival date & time 10/17/12  5809   First MD Initiated Contact with Patient 10/17/12 [phone]      Chief Complaint  Patient presents with  . Back Pain    (Consider location/radiation/quality/duration/timing/severity/associated sxs/prior treatment) HPI Comments: Patient is a 52 year old male who presents with a 1 day history of back pain. The pain started suddenly yesterday when he was at working lifting a heavy marble slab. The pain is located in his right lower back and radiates down his right leg. The pain is a sharp and severe. Patient tried advil for pain without relief. Movement makes the pain worse. Nothing makes the pain better. No associated symptoms. Patient denies saddle paresthesias and bowel/bladder incontinence.   Patient is a 52 y.o. male presenting with back pain.  Back Pain     Past Medical History  Diagnosis Date  . Hypertension   . Syncope   . Panic disorder     Past Surgical History  Procedure Date  . Wrist surgery     Family History  Problem Relation Age of Onset  . Heart attack Father   . Diabetes Neg Hx   . Hyperlipidemia Neg Hx   . Hypertension Neg Hx     History  Substance Use Topics  . Smoking status: Never Smoker   . Smokeless tobacco: Not on file  . Alcohol Use: No      Review of Systems  Musculoskeletal: Positive for back pain.  All other systems reviewed and are negative.    Allergies  Hydrocodone  Home Medications   Current Outpatient Rx  Name  Route  Sig  Dispense  Refill  . IBUPROFEN 200 MG PO TABS   Oral   Take 400 mg by mouth every 6 (six) hours as needed. For pain/fever         . OXYCODONE-ACETAMINOPHEN 5-325 MG PO TABS   Oral   Take 1 tablet by mouth every 4 (four) hours as needed for pain.   12 tablet   0     BP 163/110  Pulse 104  Temp 97.5 F (36.4 C) (Oral)  Resp 18  SpO2 99%  Physical Exam  Nursing note and vitals reviewed. Constitutional: He is oriented to person,  place, and time. He appears well-developed and well-nourished. No distress.  HENT:  Head: Normocephalic and atraumatic.  Eyes: Conjunctivae normal are normal.  Neck: Normal range of motion. Neck supple.  Cardiovascular: Normal rate and regular rhythm.  Exam reveals no gallop and no friction rub.   No murmur heard. Pulmonary/Chest: Effort normal and breath sounds normal. He has no wheezes. He has no rales. He exhibits no tenderness.  Abdominal: Soft. He exhibits no distension. There is no tenderness. There is no rebound.  Musculoskeletal: Normal range of motion.       Midline back nontender to palpation. Right lumbosacral paraspinal area tender to palpation.   Neurological: He is alert and oriented to person, place, and time.       Strength and sensation equal and intact bilaterally. Speech is goal-oriented. Moves limbs without ataxia.   Skin: Skin is warm and dry.  Psychiatric: He has a normal mood and affect. His behavior is normal.    ED Course  Procedures (including critical care time)  Labs Reviewed - No data to display No results found.   1. Back pain       MDM  10:31 AM Patient will have toradol and valium for  pain here. No bladder/bowel incontinence or saddle paresthesias. Patient can be discharged without further evaluation.         Alvina Chou, PA-C 10/18/12 1505

## 2012-10-19 NOTE — ED Provider Notes (Signed)
Medical screening examination/treatment/procedure(s) were performed by non-physician practitioner and as supervising physician I was immediately available for consultation/collaboration.   Sharyon Cable, MD 10/19/12 [phone]

## 2012-11-07 ENCOUNTER — Encounter (HOSPITAL_COMMUNITY): Payer: Self-pay | Admitting: *Deleted

## 2012-11-07 ENCOUNTER — Encounter (HOSPITAL_COMMUNITY): Payer: Self-pay

## 2012-11-07 ENCOUNTER — Emergency Department (HOSPITAL_COMMUNITY): Payer: Self-pay

## 2012-11-07 ENCOUNTER — Emergency Department: Payer: Self-pay | Admitting: Emergency Medicine

## 2012-11-07 ENCOUNTER — Emergency Department (HOSPITAL_COMMUNITY)
Admission: EM | Admit: 2012-11-07 | Discharge: 2012-11-07 | Disposition: A | Discharge status: Home / Self Care | Payer: Self-pay | Attending: Emergency Medicine | Admitting: Emergency Medicine

## 2012-11-07 DIAGNOSIS — Y939 Activity, unspecified: Secondary | ICD-10-CM | POA: Insufficient documentation

## 2012-11-07 DIAGNOSIS — G8929 Other chronic pain: Secondary | ICD-10-CM | POA: Insufficient documentation

## 2012-11-07 DIAGNOSIS — Z8669 Personal history of other diseases of the nervous system and sense organs: Secondary | ICD-10-CM | POA: Insufficient documentation

## 2012-11-07 DIAGNOSIS — Z8659 Personal history of other mental and behavioral disorders: Secondary | ICD-10-CM | POA: Insufficient documentation

## 2012-11-07 DIAGNOSIS — W19XXXA Unspecified fall, initial encounter: Secondary | ICD-10-CM | POA: Insufficient documentation

## 2012-11-07 DIAGNOSIS — W1789XA Other fall from one level to another, initial encounter: Secondary | ICD-10-CM | POA: Insufficient documentation

## 2012-11-07 DIAGNOSIS — I10 Essential (primary) hypertension: Secondary | ICD-10-CM | POA: Insufficient documentation

## 2012-11-07 DIAGNOSIS — Y929 Unspecified place or not applicable: Secondary | ICD-10-CM | POA: Insufficient documentation

## 2012-11-07 DIAGNOSIS — S59909A Unspecified injury of unspecified elbow, initial encounter: Secondary | ICD-10-CM | POA: Insufficient documentation

## 2012-11-07 DIAGNOSIS — X500XXA Overexertion from strenuous movement or load, initial encounter: Secondary | ICD-10-CM | POA: Insufficient documentation

## 2012-11-07 DIAGNOSIS — S6990XA Unspecified injury of unspecified wrist, hand and finger(s), initial encounter: Secondary | ICD-10-CM | POA: Insufficient documentation

## 2012-11-07 DIAGNOSIS — Y9269 Other specified industrial and construction area as the place of occurrence of the external cause: Secondary | ICD-10-CM | POA: Insufficient documentation

## 2012-11-07 DIAGNOSIS — Y990 Civilian activity done for income or pay: Secondary | ICD-10-CM | POA: Insufficient documentation

## 2012-11-07 DIAGNOSIS — IMO0002 Reserved for concepts with insufficient information to code with codable children: Secondary | ICD-10-CM | POA: Insufficient documentation

## 2012-11-07 DIAGNOSIS — S8391XA Sprain of unspecified site of right knee, initial encounter: Secondary | ICD-10-CM

## 2012-11-07 DIAGNOSIS — Z9889 Other specified postprocedural states: Secondary | ICD-10-CM | POA: Insufficient documentation

## 2012-11-07 MED ORDER — IBUPROFEN 800 MG PO TABS: 800.0000 mg | ORAL_TABLET | Freq: Three times a day (TID) | ORAL | Status: DC

## 2012-11-07 MED ORDER — NAPROXEN 375 MG PO TABS: 375.0000 mg | ORAL_TABLET | Freq: Two times a day (BID) | ORAL | Status: DC

## 2012-11-07 MED ORDER — IBUPROFEN 800 MG PO TABS
800.0000 mg | ORAL_TABLET | Freq: Once | ORAL | Status: AC
  Administered 2012-11-07: 800 mg via ORAL
  Filled 2012-11-07: qty 1

## 2012-11-07 MED ORDER — KETOROLAC TROMETHAMINE 60 MG/2ML IM SOLN
60.0000 mg | Freq: Once | INTRAMUSCULAR | Status: AC
  Administered 2012-11-07: 60 mg via INTRAMUSCULAR
  Filled 2012-11-07: qty 2

## 2012-11-07 MED ORDER — OXYCODONE-ACETAMINOPHEN 5-325 MG PO TABS
1.0000 | ORAL_TABLET | Freq: Once | ORAL | Status: AC
  Administered 2012-11-07: 1 via ORAL
  Filled 2012-11-07: qty 1

## 2012-11-07 MED ORDER — TRAMADOL HCL 50 MG PO TABS: 50.0000 mg | ORAL_TABLET | Freq: Four times a day (QID) | ORAL | Status: DC | PRN

## 2012-11-07 NOTE — ED Notes (Signed)
Pt dc to home.  Pt states understanding to dc paperwork.  Pt asking for narcotic prescription.  Informed pt that the doctor had given him motrin and he would have to f/u with orthopedics for something stronger.

## 2012-11-07 NOTE — ED Provider Notes (Signed)
History     CSN: [id number]  Arrival date & time 11/07/12  5625   First MD Initiated Contact with Patient 11/07/12 [phone]      Chief Complaint  Patient presents with  . Wrist Pain    (Consider location/radiation/quality/duration/timing/severity/associated sxs/prior treatment) HPI History provided by patient. States he fell 2 nights ago injuring his left wrist. He admits to history of chronic wrist problems status post wrist surgery and tells me he has broken many times before. Pain is sharp in quality located across the dorsum of his wrist. Pain is sharp in quality moderate in severity it hurts to move. No weakness or numbness. No elbow or shoulder injury. No other pain injury or trauma. Past Medical History  Diagnosis Date  . Hypertension   . Syncope   . Panic disorder     Past Surgical History  Procedure Date  . Wrist surgery     Family History  Problem Relation Age of Onset  . Heart attack Father   . Diabetes Neg Hx   . Hyperlipidemia Neg Hx   . Hypertension Neg Hx     History  Substance Use Topics  . Smoking status: Never Smoker   . Smokeless tobacco: Not on file  . Alcohol Use: No      Review of Systems  Constitutional: Negative for fever and chills.  HENT: Negative for neck pain and neck stiffness.   Eyes: Negative for pain.  Respiratory: Negative for shortness of breath.   Cardiovascular: Negative for chest pain.  Gastrointestinal: Negative for abdominal pain.  Genitourinary: Negative for dysuria.  Musculoskeletal: Negative for back pain.  Skin: Negative for rash.  Neurological: Negative for headaches.  All other systems reviewed and are negative.    Allergies  Hydrocodone  Home Medications   Current Outpatient Rx  Name  Route  Sig  Dispense  Refill  . IBUPROFEN 200 MG PO TABS   Oral   Take 200 mg by mouth every 6 (six) hours as needed. For pain           BP 99/74  Pulse 98  Temp 97.7 F (36.5 C) (Oral)  Resp 20  SpO2  100%  Physical Exam  Constitutional: He is oriented to person, place, and time. He appears well-developed and well-nourished.  HENT:  Head: Normocephalic and atraumatic.  Eyes: EOM are normal. Pupils are equal, round, and reactive to light.  Neck: Neck supple.  Cardiovascular: Regular rhythm and intact distal pulses.   Pulmonary/Chest: Effort normal. No respiratory distress.  Musculoskeletal: He exhibits no edema.       Tender over the dorsum of left wrist with no tenderness over the anatomical snuff box. Decreased range of motion secondary to pain. No swelling. No obvious deformity. Distal neurovascular intact. Nontender over elbow and shoulder.  Neurological: He is alert and oriented to person, place, and time.  Skin: Skin is warm and dry.    ED Course  Procedures (including critical care time)  Results for orders placed in visit on 01/12/10  CONVERTED [facility] LAB      Component Value Range   WBC 9.1  4.0-10.5 10*3/microliter   RBC 4.94  4.22-5.81 M/uL   Hemoglobin 14.3  13.0-17.0 g/dL   HCT 42.0  39.0-52.0 %   MCV 85.0  78.0-100.0 fL   MCHC 34.0  30.0-36.0 g/dL   RDW 13.7  11.5-15.5 %   Platelets 313  150-400 K/uL   Neutrophils Relative 67  43-77 %   Neutro Abs 6.1  1.7-7.7 K/uL   Lymphocytes Relative 22  12-46 %   Lymphs Abs 2.0  0.7-4.0 K/uL   Monocytes Relative 7  3-12 %   Monocytes Absolute 0.7  0.1-1.0 K/uL   Eosinophils Relative 4  0-5 %   Eosinophils Absolute 0.3  0.0-0.7 K/uL   Basophils Relative 0  0-1 %   Basophils Absolute 0.0  0.0-0.1 K/uL   WBC Morphology Criteria for review not met     RBC Morphology Criteria for review not met     Sodium 139  135-145 meq/L   Potassium 3.5  3.5-5.3 meq/L   Chloride 103  96-112 meq/L   CO2 21  19-32 meq/L   Glucose, Bld 84  70-99 mg/dL   BUN 12  6-23 mg/dL   Creatinine, Ser 0.97  0.40-1.50 mg/dL   Total Bilirubin 0.7  0.3-1.2 mg/dL   Alkaline Phosphatase 68  39-117 units/L   AST 20  0-37 units/L   ALT 16  0-53 units/L    Total Protein 7.3  6.0-8.3 g/dL   Albumin 5.0  3.5-5.2 g/dL   Calcium 9.9  8.4-10.5 mg/dL   TSH 3.813  0.350-4.500 microintl units/mL   Dg Wrist Complete Left  11/07/2012  *RADIOLOGY REPORT*  Clinical Data: Fall, wrist pain.  LEFT WRIST - COMPLETE 3+ VIEW  Comparison: 10/11/2012  Findings: Impaction fracture of the distal radius with volar angulation is similar to prior and presumably chronic.  Remote ulnar styloid fracture.  No definite acute fracture or dislocation. Radiocarpal degenerative changes.  Scapholunate irregularity/degenerative change is similar to prior.  IMPRESSION: Chronic post traumatic and degenerative changes without definitive acute abnormality.  If clinical concern for a fracture persists, recommend a repeat radiograph in 5-10 days to evaluate for interval change or callus formation.   Original Report Authenticated By: Carlos Levering, M.D.    Dg Wrist Complete Left  10/11/2012  *RADIOLOGY REPORT*  Clinical Data: Pain in the wrist.  Fall.  LEFT WRIST - COMPLETE 3+ VIEW  Comparison: 08/08/2012  Findings: The there is deformity of the distal radius, consistent with old fracture.  There are degenerative changes at the radiocarpal joint.  Old ulnar styloid fracture is also present. Mild widening of the scapholunate distance appears stable.  There is no evidence for acute fracture or dislocation.  IMPRESSION:  1.  Post-traumatic chronic changes. 2.  No evidence for acute abnormality.   Original Report Authenticated By: Nolon Nations, M.D.    Ice and NSAIDs provided.   MDM   Acute on chronic left wrist pain status post fall. Evaluated with x-rays reviewed as above. Medications provided. Splint provided. Orthopedic referral provided.  Holding narcotics given multiple ED visits for the same    Teressa Lower, MD 11/07/12 [phone]

## 2012-11-07 NOTE — ED Notes (Signed)
Pt c/o right sided knee injury at work today. Reports working in [employer] - fell off board and reports "twisting" right knee and ankle. Knee pain worse than ankle. Pt believes knee is swollen

## 2012-11-07 NOTE — ED Provider Notes (Signed)
History     CSN: [id number]  Arrival date & time 11/07/12  9509   First MD Initiated Contact with Patient 11/07/12 0957      Chief Complaint  Patient presents with  . Knee Injury    (Consider location/radiation/quality/duration/timing/severity/associated sxs/prior treatment) HPI Connor Solomon is a 52 y.o. male who presents to ED complaining of right knee pain after a fall. Pt was at work, states fell off a board and states as he was falling, his right knee "twisted" and he fell down on the ground. No head injury. Pain in right knee and right ankle. Unable to walk or bear weight. States his knee is swelling. States no numbness or weakness distal to the knee. No other injuries.   Past Medical History  Diagnosis Date  . Hypertension   . Syncope   . Panic disorder     Past Surgical History  Procedure Date  . Wrist surgery     Family History  Problem Relation Age of Onset  . Heart attack Father   . Diabetes Neg Hx   . Hyperlipidemia Neg Hx   . Hypertension Neg Hx     History  Substance Use Topics  . Smoking status: Never Smoker   . Smokeless tobacco: Not on file  . Alcohol Use: No      Review of Systems  Constitutional: Negative for fever and chills.  Respiratory: Negative.   Cardiovascular: Negative.   Musculoskeletal: Positive for joint swelling and arthralgias.  Neurological: Negative for weakness and numbness.    Allergies  Hydrocodone  Home Medications  No current outpatient prescriptions on file.  BP 138/80  Pulse 70  Temp 97.7 F (36.5 C) (Oral)  Resp 16  Ht $R'5\' 8"'dc$  (1.727 m)  Wt 170 lb (77.111 kg)  BMI 25.85 kg/m2  SpO2 98%  Physical Exam  Nursing note and vitals reviewed. Constitutional: He appears well-developed and well-nourished. No distress.  Cardiovascular: Normal rate, regular rhythm and normal heart sounds.   Pulmonary/Chest: Effort normal and breath sounds normal. No respiratory distress. He has no wheezes.  Musculoskeletal:   Normal appearing right knee and ankle. Full ROM of the right ankle. Tender over bilateral malleoli, with no swelling or bruising. Joint is stable. Achillis tendon intact. Pain with ROM of right knee, unable assess due to pain. Joint is stable with negative anterior, posterior drawer signs. No laxity or pain with medial or lateral stress. Patella tendon intact. Normal dorsal pedal pulses    ED Course  Procedures (including critical care time)  Labs Reviewed - No data to display Dg Wrist Complete Left  11/07/2012  *RADIOLOGY REPORT*  Clinical Data: Fall, wrist pain.  LEFT WRIST - COMPLETE 3+ VIEW  Comparison: 10/11/2012  Findings: Impaction fracture of the distal radius with volar angulation is similar to prior and presumably chronic.  Remote ulnar styloid fracture.  No definite acute fracture or dislocation. Radiocarpal degenerative changes.  Scapholunate irregularity/degenerative change is similar to prior.  IMPRESSION: Chronic post traumatic and degenerative changes without definitive acute abnormality.  If clinical concern for a fracture persists, recommend a repeat radiograph in 5-10 days to evaluate for interval change or callus formation.   Original Report Authenticated By: Carlos Levering, M.D.      1. Sprain of right knee       MDM  PT with chronic pain issues. Here with right knee injury. Exam does not show any abnormalities. X-ray negative. Cannot rule out ligamentous vs meniscal injury, although doubt a complete  tear, joint is stable on exam. Will apply knee immobilizer, crutches. Follow up with orthopedics.         Renold Genta, [place] 11/07/12 1049

## 2012-11-07 NOTE — ED Notes (Signed)
Pt to ed with c/o wrist pain.  Slipped on ground yesterday and pushed wrist backwards.   No visible deformity or swelling noted.  Took ibuprofen without relief.

## 2012-11-08 ENCOUNTER — Encounter (HOSPITAL_COMMUNITY): Payer: Self-pay | Admitting: Emergency Medicine

## 2012-11-08 ENCOUNTER — Emergency Department (HOSPITAL_COMMUNITY)
Admission: EM | Admit: 2012-11-08 | Discharge: 2012-11-08 | Disposition: A | Discharge status: Home / Self Care | Payer: Self-pay | Attending: Emergency Medicine | Admitting: Emergency Medicine

## 2012-11-08 ENCOUNTER — Emergency Department (HOSPITAL_COMMUNITY): Payer: Self-pay

## 2012-11-08 DIAGNOSIS — M549 Dorsalgia, unspecified: Secondary | ICD-10-CM

## 2012-11-08 DIAGNOSIS — Y9301 Activity, walking, marching and hiking: Secondary | ICD-10-CM | POA: Insufficient documentation

## 2012-11-08 DIAGNOSIS — Y9289 Other specified places as the place of occurrence of the external cause: Secondary | ICD-10-CM | POA: Insufficient documentation

## 2012-11-08 DIAGNOSIS — I10 Essential (primary) hypertension: Secondary | ICD-10-CM | POA: Insufficient documentation

## 2012-11-08 DIAGNOSIS — IMO0002 Reserved for concepts with insufficient information to code with codable children: Secondary | ICD-10-CM | POA: Insufficient documentation

## 2012-11-08 DIAGNOSIS — W010XXA Fall on same level from slipping, tripping and stumbling without subsequent striking against object, initial encounter: Secondary | ICD-10-CM | POA: Insufficient documentation

## 2012-11-08 DIAGNOSIS — Z8659 Personal history of other mental and behavioral disorders: Secondary | ICD-10-CM | POA: Insufficient documentation

## 2012-11-08 DIAGNOSIS — Y990 Civilian activity done for income or pay: Secondary | ICD-10-CM | POA: Insufficient documentation

## 2012-11-08 DIAGNOSIS — W1809XA Striking against other object with subsequent fall, initial encounter: Secondary | ICD-10-CM | POA: Insufficient documentation

## 2012-11-08 MED ORDER — OXYCODONE-ACETAMINOPHEN 5-325 MG PO TABS
1.0000 | ORAL_TABLET | Freq: Once | ORAL | Status: AC
  Administered 2012-11-08: 1 via ORAL
  Filled 2012-11-08: qty 1

## 2012-11-08 MED ORDER — CYCLOBENZAPRINE HCL 10 MG PO TABS: 10.0000 mg | ORAL_TABLET | Freq: Two times a day (BID) | ORAL | Status: DC | PRN

## 2012-11-08 NOTE — ED Provider Notes (Signed)
Medical screening examination/treatment/procedure(s) were performed by non-physician practitioner and as supervising physician I was immediately available for consultation/collaboration.   Richarda Blade, MD 11/08/12 [phone]

## 2012-11-08 NOTE — ED Notes (Signed)
Pt c/o fall from standing position, Pt slipped on ice, pain in tail bone, shooting down legs. Pt states he hit the back of his head , denies change in LOC.

## 2012-11-08 NOTE — ED Provider Notes (Signed)
History     CSN: [id number]  Arrival date & time 11/08/12  4128   First MD Initiated Contact with Patient 11/08/12 [phone]      Chief Complaint  Patient presents with  . Fall    (Consider location/radiation/quality/duration/timing/severity/associated sxs/prior treatment) HPI  Connor Solomon is a 52 y.o. male with chronic pain c/o pian s/p slip and fall this AM at 6:30 AM while at work and walking down an incline on an icey surface. Patient fell and hit his to 6 and also hit the back of his head at the occiput. He denies any loss of consciousness, change in vision, nausea vomiting, headache. Patient does endorse a severe pain to the lumbar region initially it radiated down to the legs or paresthesia but that has resolved. Denies any difficulty ambulating. Patient was seen for a fall at [employer] approximately 24 hours ago. Patient states that his falls are secondary to a hazardous work environment and poor footwear that does not have enough grip control.   Past Medical History  Diagnosis Date  . Hypertension   . Syncope   . Panic disorder     Past Surgical History  Procedure Date  . Wrist surgery     Family History  Problem Relation Age of Onset  . Heart attack Father   . Diabetes Neg Hx   . Hyperlipidemia Neg Hx   . Hypertension Neg Hx     History  Substance Use Topics  . Smoking status: Never Smoker   . Smokeless tobacco: Not on file  . Alcohol Use: No      Review of Systems  Constitutional: Negative for fever.  Respiratory: Negative for shortness of breath.   Cardiovascular: Negative for chest pain.  Gastrointestinal: Negative for nausea, vomiting, abdominal pain and diarrhea.  Musculoskeletal: Positive for back pain.  All other systems reviewed and are negative.    Allergies  Hydrocodone  Home Medications   Current Outpatient Rx  Name  Route  Sig  Dispense  Refill  . NAPROXEN 375 MG PO TABS   Oral   Take 1 tablet (375 mg total) by mouth 2 (two)  times daily.   20 tablet   0     There were no vitals taken for this visit.  Physical Exam  Nursing note and vitals reviewed. Constitutional: He is oriented to person, place, and time. He appears well-developed and well-nourished. No distress.  HENT:  Head: Normocephalic and atraumatic.  Right Ear: External ear normal.  Left Ear: External ear normal.  Mouth/Throat: Oropharynx is clear and moist.  Eyes: Conjunctivae normal and EOM are normal. Pupils are equal, round, and reactive to light.  Neck: Normal range of motion.       No midline tenderness to palpation or step-offs appreciated. Full range of motion without pain.  Cardiovascular: Normal rate, regular rhythm, normal heart sounds and intact distal pulses.   Pulmonary/Chest: Effort normal and breath sounds normal. No stridor. No respiratory distress. He has no wheezes. He has no rales. He exhibits no tenderness.  Abdominal: Soft. Bowel sounds are normal. He exhibits distension. He exhibits no mass. There is no tenderness. There is no rebound and no guarding.  Musculoskeletal: Normal range of motion.  Neurological: He is alert and oriented to person, place, and time.       Cranial nerves III through XII intact, strength 5 out of 5x4 extremities, negative pronator drift, finger to nose and heel-to-shin coordinated, sensation intact to pinprick and light touch, gait  is coordinated and Romberg is negative.   Psychiatric: He has a normal mood and affect.    ED Course  Procedures (including critical care time)  Labs Reviewed - No data to display Dg Wrist Complete Left  11/07/2012  *RADIOLOGY REPORT*  Clinical Data: Fall, wrist pain.  LEFT WRIST - COMPLETE 3+ VIEW  Comparison: 10/11/2012  Findings: Impaction fracture of the distal radius with volar angulation is similar to prior and presumably chronic.  Remote ulnar styloid fracture.  No definite acute fracture or dislocation. Radiocarpal degenerative changes.  Scapholunate  irregularity/degenerative change is similar to prior.  IMPRESSION: Chronic post traumatic and degenerative changes without definitive acute abnormality.  If clinical concern for a fracture persists, recommend a repeat radiograph in 5-10 days to evaluate for interval change or callus formation.   Original Report Authenticated By: Carlos Levering, M.D.    Dg Ankle Complete Right  11/07/2012  *RADIOLOGY REPORT*  Clinical Data: Injury  RIGHT ANKLE - COMPLETE 3+ VIEW  Comparison: None.  Findings: Three views of the right ankle submitted.  No acute fracture or subluxation.  Ankle mortise is preserved.  Tiny plantar spur of the calcaneus.  IMPRESSION: No acute fracture or subluxation.  Tiny plantar spur of the calcaneus.   Original Report Authenticated By: Lahoma Crocker, M.D.    Dg Knee Complete 4 Views Right  11/07/2012  *RADIOLOGY REPORT*  Clinical Data: Knee injury post fall  RIGHT KNEE - COMPLETE 4+ VIEW  Comparison: 05/18/2012  Findings: Four views of the right knee submitted.  No acute fracture or subluxation.  Small joint effusion.  IMPRESSION: No acute fracture or subluxation.  Small joint effusion.   Original Report Authenticated By: Lahoma Crocker, M.D.      1. Back pain       MDM  X-ray shows no fracture.  Pt verbalized understanding and agrees with care plan. Outpatient follow-up and return precautions given.    New Prescriptions   CYCLOBENZAPRINE (FLEXERIL) 10 MG TABLET    Take 1 tablet (10 mg total) by mouth 2 (two) times daily as needed for muscle spasms.          Monico Blitz, PA-C 11/08/12 1621

## 2012-11-08 NOTE — ED Provider Notes (Signed)
Medical screening examination/treatment/procedure(s) were performed by non-physician practitioner and as supervising physician I was immediately available for consultation/collaboration.    Kathalene Frames, MD 11/08/12 1120

## 2012-11-08 NOTE — ED Notes (Signed)
Patient transported to X-ray 

## 2012-11-13 ENCOUNTER — Ambulatory Visit (INDEPENDENT_AMBULATORY_CARE_PROVIDER_SITE_OTHER): Payer: Self-pay | Admitting: Family Medicine

## 2012-11-13 ENCOUNTER — Encounter: Payer: Self-pay | Admitting: Family Medicine

## 2012-11-13 VITALS — BP 125/87 | HR 76 | Ht 68.0 in | Wt 173.0 lb

## 2012-11-13 DIAGNOSIS — M25539 Pain in unspecified wrist: Secondary | ICD-10-CM

## 2012-11-13 NOTE — Progress Notes (Signed)
Subjective:     Patient ID: Connor Solomon, male   DOB: [birth year], 52 y.o.   MRN: [id number]  HPI 52 year old gentleman presents to the clinic today to establish care.  Chief complaint today is L wrist pain.  1) Left wrist pain - Worsening over the past 2 months.  Has had chronic wrist pain for 10 years. - Patient also had a recent fall (foosh injury) on 1/28.  Received narcotics in the ED. - Patient reports pain is increasing.  Currently 10/10 in severity, described as steady/achy pain.  Also reports decreased ROM and weakness. - Takes Advil daily (4 tablets/day), Ibuprofen (200 mg/day). - Requesting Percocet or Vicidin for pain.  2) HTN - Currently well controlled on no medication   Review of Systems General: denies recent fracture but reports recent fall (was seen in the ED on 1/28) MSK: reports weakness and decreased ROM.  Also reports pain.     Objective:   Physical Exam General: well appearing gentlemen in NAD. Heart: RRR. No murmurs, rubs, or gallops. Lungs: CTAB. No rales, rhonchi, or wheeze MSK: L wrist tender to palpation and painful upon movement.  Decreased ROM (active and passive) in all directions.  Decreased grip strength 4/5.    Assessment:         Plan:

## 2012-11-13 NOTE — Patient Instructions (Addendum)
It was nice seeing you today.  Continue to use NSAID's - Ibuprofen 800 mg 2-3 times a day.  You can also take Tylenol (up to 4000 mg daily) and use topical over the counter agents (Aspercreme, Capsaicin).  Also continue to use your wrist brace on a regular basis.  The most important thing you can do is be evaluated by an orthopedist or hand surgeon.  Please call around and make an appointment with a local orthopedist (or hand surgeon).

## 2012-11-13 NOTE — Assessment & Plan Note (Signed)
Patient has no insurance, so patient instructed to call orthopedics/hand surgery in [facility] for further evaluation and possible surgical treatment.  Instructed patient to take 800 mg Ibuprofen 2-3 times a day and use topical agents (Aspercreme, Capsaicin) as needed.   Patient was adamant about receiving Percocet or Vicodin but given lack of physical exam findings and long standing history of chronic pain, they were not prescribed.  Discussed with attending Dr. Erin Hearing.

## 2012-11-15 ENCOUNTER — Other Ambulatory Visit: Payer: Self-pay | Admitting: Orthopedic Surgery

## 2012-11-22 ENCOUNTER — Encounter (HOSPITAL_BASED_OUTPATIENT_CLINIC_OR_DEPARTMENT_OTHER): Payer: Self-pay | Admitting: *Deleted

## 2012-11-22 NOTE — Progress Notes (Signed)
Pt was told he may stay overnight-to bring overnight bag- No ht or resp problems

## 2012-11-29 ENCOUNTER — Ambulatory Visit (HOSPITAL_BASED_OUTPATIENT_CLINIC_OR_DEPARTMENT_OTHER)
Admission: RE | Admit: 2012-11-29 | Discharge: 2012-11-29 | Disposition: A | Discharge status: Home / Self Care | Payer: Self-pay | Source: Ambulatory Visit | Attending: Orthopedic Surgery | Admitting: Orthopedic Surgery

## 2012-11-29 ENCOUNTER — Encounter (HOSPITAL_BASED_OUTPATIENT_CLINIC_OR_DEPARTMENT_OTHER): Payer: Self-pay | Admitting: Anesthesiology

## 2012-11-29 ENCOUNTER — Encounter (HOSPITAL_BASED_OUTPATIENT_CLINIC_OR_DEPARTMENT_OTHER)
Admission: RE | Disposition: A | Discharge status: Home / Self Care | Payer: Self-pay | Source: Ambulatory Visit | Attending: Orthopedic Surgery

## 2012-11-29 ENCOUNTER — Encounter (HOSPITAL_BASED_OUTPATIENT_CLINIC_OR_DEPARTMENT_OTHER): Payer: Self-pay

## 2012-11-29 ENCOUNTER — Ambulatory Visit (HOSPITAL_BASED_OUTPATIENT_CLINIC_OR_DEPARTMENT_OTHER): Payer: Self-pay | Admitting: Anesthesiology

## 2012-11-29 DIAGNOSIS — M129 Arthropathy, unspecified: Secondary | ICD-10-CM | POA: Insufficient documentation

## 2012-11-29 DIAGNOSIS — F410 Panic disorder [episodic paroxysmal anxiety] without agoraphobia: Secondary | ICD-10-CM | POA: Insufficient documentation

## 2012-11-29 DIAGNOSIS — I10 Essential (primary) hypertension: Secondary | ICD-10-CM | POA: Insufficient documentation

## 2012-11-29 DIAGNOSIS — S52532A Colles' fracture of left radius, initial encounter for closed fracture: Secondary | ICD-10-CM

## 2012-11-29 DIAGNOSIS — IMO0002 Reserved for concepts with insufficient information to code with codable children: Secondary | ICD-10-CM | POA: Insufficient documentation

## 2012-11-29 DIAGNOSIS — Z885 Allergy status to narcotic agent status: Secondary | ICD-10-CM | POA: Insufficient documentation

## 2012-11-29 HISTORY — PX: OPEN REDUCTION INTERNAL FIXATION (ORIF) DISTAL RADIAL FRACTURE: SHX5989

## 2012-11-29 HISTORY — DX: Unspecified osteoarthritis, unspecified site: M19.90

## 2012-11-29 LAB — POCT HEMOGLOBIN-HEMACUE: Hemoglobin: 15.4 g/dL (ref 13.0–17.0)

## 2012-11-29 SURGERY — OPEN REDUCTION INTERNAL FIXATION (ORIF) DISTAL RADIUS FRACTURE
Anesthesia: Regional | Site: Wrist | Laterality: Left | Wound class: Clean

## 2012-11-29 MED ORDER — OXYCODONE HCL 5 MG PO TABS
5.0000 mg | ORAL_TABLET | Freq: Once | ORAL | Status: AC | PRN
  Administered 2012-11-29: 5 mg via ORAL

## 2012-11-29 MED ORDER — DEXAMETHASONE SODIUM PHOSPHATE 10 MG/ML IJ SOLN
INTRAMUSCULAR | Status: DC | PRN
  Administered 2012-11-29: 10 mg via INTRAVENOUS
  Administered 2012-11-29: 10 mg

## 2012-11-29 MED ORDER — HYDROMORPHONE HCL PF 1 MG/ML IJ SOLN
0.2500 mg | INTRAMUSCULAR | Status: DC | PRN
  Administered 2012-11-29 (×3): 0.5 mg via INTRAVENOUS

## 2012-11-29 MED ORDER — ONDANSETRON HCL 4 MG/2ML IJ SOLN: 4.0000 mg | Freq: Once | INTRAMUSCULAR | Status: DC | PRN

## 2012-11-29 MED ORDER — OXYCODONE-ACETAMINOPHEN 5-325 MG PO TABS: 1.0000 | ORAL_TABLET | ORAL | Status: DC | PRN

## 2012-11-29 MED ORDER — FENTANYL CITRATE 0.05 MG/ML IJ SOLN
INTRAMUSCULAR | Status: DC | PRN
  Administered 2012-11-29 (×2): 25 ug via INTRAVENOUS

## 2012-11-29 MED ORDER — OXYCODONE HCL 5 MG/5ML PO SOLN: 5.0000 mg | Freq: Once | ORAL | Status: AC | PRN

## 2012-11-29 MED ORDER — ONDANSETRON HCL 4 MG/2ML IJ SOLN
INTRAMUSCULAR | Status: DC | PRN
  Administered 2012-11-29: 4 mg via INTRAVENOUS

## 2012-11-29 MED ORDER — FENTANYL CITRATE 0.05 MG/ML IJ SOLN
50.0000 ug | INTRAMUSCULAR | Status: DC | PRN
  Administered 2012-11-29: 100 ug via INTRAVENOUS

## 2012-11-29 MED ORDER — CEFAZOLIN SODIUM-DEXTROSE 2-3 GM-% IV SOLR
INTRAVENOUS | Status: DC | PRN
  Administered 2012-11-29: 2 g via INTRAVENOUS

## 2012-11-29 MED ORDER — PROPOFOL 10 MG/ML IV BOLUS
INTRAVENOUS | Status: DC | PRN
  Administered 2012-11-29: 200 mg via INTRAVENOUS
  Administered 2012-11-29: 100 mg via INTRAVENOUS

## 2012-11-29 MED ORDER — MIDAZOLAM HCL 2 MG/2ML IJ SOLN
1.0000 mg | INTRAMUSCULAR | Status: DC | PRN
  Administered 2012-11-29: 2 mg via INTRAVENOUS

## 2012-11-29 MED ORDER — LACTATED RINGERS IV SOLN
INTRAVENOUS | Status: DC
  Administered 2012-11-29 (×2): via INTRAVENOUS

## 2012-11-29 MED ORDER — LIDOCAINE HCL (CARDIAC) 20 MG/ML IV SOLN
INTRAVENOUS | Status: DC | PRN
  Administered 2012-11-29: 80 mg via INTRAVENOUS

## 2012-11-29 MED ORDER — CHLORHEXIDINE GLUCONATE 4 % EX LIQD: 60.0000 mL | Freq: Once | CUTANEOUS | Status: DC

## 2012-11-29 MED ORDER — BUPIVACAINE-EPINEPHRINE PF 0.5-1:200000 % IJ SOLN
INTRAMUSCULAR | Status: DC | PRN
  Administered 2012-11-29: 25 mL

## 2012-11-29 SURGICAL SUPPLY — 85 items
APL SKNCLS STERI-STRIP NONHPOA (GAUZE/BANDAGES/DRESSINGS)
BAG DECANTER FOR FLEXI CONT (MISCELLANEOUS) IMPLANT
BANDAGE ELASTIC 3 VELCRO ST LF (GAUZE/BANDAGES/DRESSINGS) IMPLANT
BANDAGE ELASTIC 4 VELCRO ST LF (GAUZE/BANDAGES/DRESSINGS) ×2 IMPLANT
BANDAGE GAUZE ELAST BULKY 4 IN (GAUZE/BANDAGES/DRESSINGS) ×2 IMPLANT
BENZOIN TINCTURE PRP APPL 2/3 (GAUZE/BANDAGES/DRESSINGS) IMPLANT
BIT DRILL 2.8 (BIT) ×1
BIT DRILL CANN QC 2.8X165 (BIT) ×1 IMPLANT
BIT DRILL SOLID 2.5X110 (BIT) ×1 IMPLANT
BLADE AVERAGE 25X9 (BLADE) ×2 IMPLANT
BLADE MINI RND TIP GREEN BEAV (BLADE) IMPLANT
BLADE SURG 15 STRL LF DISP TIS (BLADE) ×2 IMPLANT
BLADE SURG 15 STRL SS (BLADE) ×4
BNDG CMPR 9X4 STRL LF SNTH (GAUZE/BANDAGES/DRESSINGS) ×1
BNDG ESMARK 4X9 LF (GAUZE/BANDAGES/DRESSINGS) ×1 IMPLANT
CANISTER SUCTION 1200CC (MISCELLANEOUS) ×1 IMPLANT
CLOTH BEACON ORANGE TIMEOUT ST (SAFETY) ×2 IMPLANT
CORDS BIPOLAR (ELECTRODE) ×2 IMPLANT
COVER TABLE BACK 60X90 (DRAPES) ×2 IMPLANT
CUFF TOURNIQUET SINGLE 18IN (TOURNIQUET CUFF) ×1 IMPLANT
DECANTER SPIKE VIAL GLASS SM (MISCELLANEOUS) IMPLANT
DRAPE EXTREMITY T 121X128X90 (DRAPE) ×2 IMPLANT
DRAPE OEC MINIVIEW 54X84 (DRAPES) ×2 IMPLANT
DRAPE SURG 17X23 STRL (DRAPES) ×2 IMPLANT
DRILL BIT 2.8MM (BIT) ×2
DURAPREP 26ML APPLICATOR (WOUND CARE) ×2 IMPLANT
ELECT REM PT RETURN 9FT ADLT (ELECTROSURGICAL)
ELECTRODE REM PT RTRN 9FT ADLT (ELECTROSURGICAL) IMPLANT
GAUZE SPONGE 4X4 16PLY XRAY LF (GAUZE/BANDAGES/DRESSINGS) IMPLANT
GAUZE XEROFORM 1X8 LF (GAUZE/BANDAGES/DRESSINGS) ×1 IMPLANT
GLOVE BIO SURGEON STRL SZ 6.5 (GLOVE) ×2 IMPLANT
GLOVE BIO SURGEON STRL SZ8.5 (GLOVE) ×3 IMPLANT
GLOVE INDICATOR 7.0 STRL GRN (GLOVE) ×2 IMPLANT
GOWN PREVENTION PLUS XLARGE (GOWN DISPOSABLE) ×2 IMPLANT
GOWN PREVENTION PLUS XXLARGE (GOWN DISPOSABLE) ×3 IMPLANT
KWIRE 4.0 X .062IN (WIRE) ×3 IMPLANT
NDL HYPO 25X1 1.5 SAFETY (NEEDLE) ×1 IMPLANT
NEEDLE HYPO 25X1 1.5 SAFETY (NEEDLE) ×2 IMPLANT
NS IRRIG 1000ML POUR BTL (IV SOLUTION) ×2 IMPLANT
PACK BASIN DAY SURGERY FS (CUSTOM PROCEDURE TRAY) ×2 IMPLANT
PAD CAST 3X4 CTTN HI CHSV (CAST SUPPLIES) ×1 IMPLANT
PAD CAST 4YDX4 CTTN HI CHSV (CAST SUPPLIES) IMPLANT
PADDING CAST ABS 4INX4YD NS (CAST SUPPLIES) ×1
PADDING CAST ABS COTTON 4X4 ST (CAST SUPPLIES) ×1 IMPLANT
PADDING CAST COTTON 3X4 STRL (CAST SUPPLIES) ×2
PADDING CAST COTTON 4X4 STRL (CAST SUPPLIES) ×2
PENCIL BUTTON HOLSTER BLD 10FT (ELECTRODE) IMPLANT
PLATE 3.5 3H HD/4H SFT/63 RT (Plate) ×2 IMPLANT
PUTTY DBM STAGRAFT 5CC (Putty) ×2 IMPLANT
SCREW BN 12X3.5XNS CORT TI (Screw) ×1 IMPLANT
SCREW CORT 3.5X12 (Screw) ×2 IMPLANT
SCREW CORT 3.5X16 LNG (Screw) ×1 IMPLANT
SCREW CORTEX 3.5 14MM (Screw) ×2 IMPLANT
SCREW CORTEX 3.5 16MM (Screw) ×1 IMPLANT
SCREW CORTEX 3.5 20MM (Screw) ×1 IMPLANT
SCREW LOCK CORT ST 3.5X14 (Screw) IMPLANT
SCREW LOCK CORT ST 3.5X16 (Screw) IMPLANT
SCREW LOCK CORT ST 3.5X20 (Screw) IMPLANT
SCREW LOCK T15 FT 30X3.5X2.9X (Screw) IMPLANT
SCREW LOCK T15 FT 32X3.5X2.9X (Screw) IMPLANT
SCREW LOCKING 3.5X26 (Screw) ×1 IMPLANT
SCREW LOCKING 3.5X30 (Screw) ×4 IMPLANT
SCREW LOCKING 3.5X32 (Screw) IMPLANT
SHEET MEDIUM DRAPE 40X70 STRL (DRAPES) ×2 IMPLANT
SLING ARM FOAM STRAP LRG (SOFTGOODS) ×1 IMPLANT
SPLINT PLASTER CAST XFAST 3X15 (CAST SUPPLIES) IMPLANT
SPLINT PLASTER CAST XFAST 4X15 (CAST SUPPLIES) ×5 IMPLANT
SPLINT PLASTER XTRA FAST SET 4 (CAST SUPPLIES) ×5
SPLINT PLASTER XTRA FASTSET 3X (CAST SUPPLIES)
SPONGE GAUZE 4X4 12PLY (GAUZE/BANDAGES/DRESSINGS) ×2 IMPLANT
STOCKINETTE 4X48 STRL (DRAPES) ×2 IMPLANT
STRIP CLOSURE SKIN 1/2X4 (GAUZE/BANDAGES/DRESSINGS) IMPLANT
SUCTION FRAZIER TIP 10 FR DISP (SUCTIONS) ×1 IMPLANT
SUT ETHILON 4 0 PS 2 18 (SUTURE) IMPLANT
SUT MERSILENE 4 0 P 3 (SUTURE) IMPLANT
SUT PROLENE 3 0 PS 2 (SUTURE) IMPLANT
SUT SILK 2 0 FS (SUTURE) IMPLANT
SUT VIC AB 3-0 FS2 27 (SUTURE) ×1 IMPLANT
SUT VICRYL RAPIDE 4/0 PS 2 (SUTURE) ×4 IMPLANT
SYR BULB 3OZ (MISCELLANEOUS) ×2 IMPLANT
SYRINGE 10CC LL (SYRINGE) ×2 IMPLANT
TOWEL OR 17X24 6PK STRL BLUE (TOWEL DISPOSABLE) ×3 IMPLANT
TUBE CONNECTING 20X1/4 (TUBING) ×1 IMPLANT
UNDERPAD 30X30 INCONTINENT (UNDERPADS AND DIAPERS) ×2 IMPLANT
WATER STERILE IRR 1000ML POUR (IV SOLUTION) ×1 IMPLANT

## 2012-11-29 NOTE — Transfer of Care (Signed)
Immediate Anesthesia Transfer of Care Note  Patient: Connor Solomon  Procedure(s) Performed: Procedure(s) with comments: OPEN REDUCTION INTERNAL FIXATION (ORIF) DISTAL RADIAL FRACTURE (Left) - LEFT DISTAL RADIUS OSTEOTOMY WITH BONE GRAFT  Patient Location: PACU  Anesthesia Type:GA combined with regional for post-op pain  Level of Consciousness: sedated  Airway & Oxygen Therapy: Patient Spontanous Breathing and Patient connected to face mask oxygen  Post-op Assessment: Report given to PACU RN and Post -op Vital signs reviewed and stable  Post vital signs: Reviewed and stable  Complications: No apparent anesthesia complications

## 2012-11-29 NOTE — Anesthesia Postprocedure Evaluation (Signed)
  Anesthesia Post-op Note  Patient: Connor Solomon  Procedure(s) Performed: Procedure(s) with comments: OPEN REDUCTION INTERNAL FIXATION (ORIF) DISTAL RADIAL FRACTURE (Left) - LEFT DISTAL RADIUS OSTEOTOMY WITH BONE GRAFT  Patient Location: PACU  Anesthesia Type:GA combined with regional for post-op pain  Level of Consciousness: awake, alert  and oriented  Airway and Oxygen Therapy: Patient Spontanous Breathing and Patient connected to face mask oxygen  Post-op Pain: none  Post-op Assessment: Post-op Vital signs reviewed  Post-op Vital Signs: Reviewed  Complications: No apparent anesthesia complications

## 2012-11-29 NOTE — Progress Notes (Signed)
  Assisted Dr. Al Corpus with left, ultrasound guided, supraclavicular block. Side rails up, monitors on throughout procedure. See vital signs in flow sheet. Tolerated Procedure well.

## 2012-11-29 NOTE — Op Note (Signed)
See note [phone]

## 2012-11-29 NOTE — Anesthesia Preprocedure Evaluation (Addendum)
Anesthesia Evaluation  Patient identified by MRN, date of birth, ID band Patient awake    Reviewed: Allergy & Precautions, H&P , NPO status , Patient's Chart, lab work & pertinent test results  Airway Mallampati: I TM Distance: >3 FB Neck ROM: Full    Dental  (+) Teeth Intact and Dental Advisory Given   Pulmonary  breath sounds clear to auscultation        Cardiovascular Rhythm:Regular Rate:Normal     Neuro/Psych    GI/Hepatic   Endo/Other    Renal/GU      Musculoskeletal   Abdominal   Peds  Hematology   Anesthesia Other Findings   Reproductive/Obstetrics                          Anesthesia Physical Anesthesia Plan  ASA: I  Anesthesia Plan: General   Post-op Pain Management:    Induction: Intravenous  Airway Management Planned: LMA  Additional Equipment:   Intra-op Plan:   Post-operative Plan: Extubation in OR  Informed Consent: I have reviewed the patients History and Physical, chart, labs and discussed the procedure including the risks, benefits and alternatives for the proposed anesthesia with the patient or authorized representative who has indicated his/her understanding and acceptance.   Dental advisory given  Plan Discussed with: CRNA, Anesthesiologist and Surgeon  Anesthesia Plan Comments:        Anesthesia Quick Evaluation

## 2012-11-29 NOTE — Anesthesia Procedure Notes (Addendum)
Anesthesia Regional Block:  Supraclavicular block  Pre-Anesthetic Checklist: ,, timeout performed, Correct Patient, Correct Site, Correct Laterality, Correct Procedure, Correct Position, site marked, Risks and benefits discussed,  Surgical consent,  Pre-op evaluation,  At surgeon's request and post-op pain management  Laterality: Left and Upper  Prep: chloraprep       Needles:   Needle Type: Echogenic Needle     Needle Length: 5cm 5 cm Needle Gauge: 21    Additional Needles:  Procedures: ultrasound guided (picture in chart) Supraclavicular block Narrative:  Start time: 11/29/2012 12:18 PM End time: 11/29/2012 12:26 PM Injection made incrementally with aspirations every 5 mL.  Performed by: Personally  Anesthesiologist: Lorrene Reid, MD  Interscalene brachial plexus block Procedure Name: LMA Insertion Date/Time: 11/29/2012 12:56 PM Performed by: Maryella Shivers Pre-anesthesia Checklist: Patient identified, Emergency Drugs available, Suction available and Patient being monitored Patient Re-evaluated:Patient Re-evaluated prior to inductionOxygen Delivery Method: Circle System Utilized Preoxygenation: Pre-oxygenation with 100% oxygen Intubation Type: IV induction Ventilation: Mask ventilation without difficulty LMA: LMA inserted LMA Size: 5.0 Number of attempts: 1 Airway Equipment and Method: bite block Placement Confirmation: positive ETCO2 Tube secured with: Tape Dental Injury: Teeth and Oropharynx as per pre-operative assessment

## 2012-11-30 NOTE — H&P (Signed)
Connor Solomon is an 52 y.o. male.   Chief Complaint:left wrist pain and deformity HPI: as above with painful malunion of left distal radius  Past Medical History  Diagnosis Date  . Hypertension   . Syncope   . Panic disorder   . Arthritis     Past Surgical History  Procedure Laterality Date  . Wrist surgery      fx only  . Wisdom tooth extraction      Family History  Problem Relation Age of Onset  . Heart attack Father   . Diabetes Neg Hx   . Hyperlipidemia Neg Hx   . Hypertension Neg Hx    Social History:  reports that he has never smoked. He does not have any smokeless tobacco history on file. He reports that he does not drink alcohol or use illicit drugs.  Allergies:  Allergies  Allergen Reactions  . Hydrocodone Itching and Nausea And Vomiting    No prescriptions prior to admission    Results for orders placed during the hospital encounter of 11/29/12 (from the past 48 hour(s))  POCT HEMOGLOBIN-HEMACUE     Status: None   Collection Time    11/29/12 12:08 PM      Result Value Range   Hemoglobin 15.4  13.0 - 17.0 g/dL   No results found.  Review of Systems  All other systems reviewed and are negative.    Blood pressure 143/77, pulse 84, temperature 97.8 F (36.6 C), temperature source Oral, resp. rate 16, height $RemoveBe'5\' 8"'qmwoSxFtE$  (1.727 m), weight 74.05 kg (163 lb 4 oz), SpO2 100.00%. Physical Exam  Constitutional: He is oriented to person, place, and time. He appears well-developed and well-nourished.  HENT:  Head: Normocephalic and atraumatic.  Cardiovascular: Normal rate.   Respiratory: Effort normal.  Musculoskeletal:       Left wrist: He exhibits bony tenderness and deformity.  Neurological: He is alert and oriented to person, place, and time.  Skin: Skin is warm.  Psychiatric: He has a normal mood and affect. His behavior is normal. Judgment and thought content normal.     Assessment/Plan As above  Plan osteotomy and bone graft of  above  Chemere Steffler A 11/30/2012, 6:51 AM

## 2012-11-30 NOTE — Op Note (Signed)
NAME:  Connor Solomon, Connor Solomon               ACCOUNT NO.:  [id number]  MEDICAL RECORD NO.:  [id number]  LOCATION:                               FACILITY:  [facility]  PHYSICIAN:  Sheral Apley. 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  PROCEDURE:  Osteotomy with bone grafting above.  SURGEONS:  Sheral Apley. Burney Gauze, MD and Dr. Fredna Dow.  ANESTHETIC:  Block and general.  COMPLICATION:  None.  DRAINS:  None.  DESCRIPTION OF PROCEDURE:  The patient was taken to the operating suite. After induction of adequate general anesthesia, and supraclavicular or axillary block analgesia, left upper extremity was prepped and draped in sterile fashion.  An Esmarch was used to exsanguinate the limb. Tourniquet was then inflated to 250 mmHg.  At this point in time, an incision was made at the [facility] area longitudinally off the distal radius, left side.  Skin was incised sharply.  Dissection was carried down to the radial artery and FCR.  The pronator quadratus was identified and split.  The distal radius was exposed subperiosteally.  There was a volarly angulated fracture of the distal radius that has healed in excess of volar angulation.  The brachioradialis was released off the distal fragment.  Once this was done, the osteotomy was created 2-3 cm proximal to the joint line parallel to the joint line using an oscillating saw.  Laminar spreaders and osteotome was used to open up the osteotomy site.  At this point in time, a Synthes small fragment locking T plate was fastened to the distal aspect of the osteotomy using 3 locking screws parallel to the joint surface.  Intraoperative fluoroscopy revealed adequate reduction in AP, lateral, and  oblique view.  The hand was then hyperextended over rolled towels to complete the osteotomy and reduction.  The proximal aspect of the plate was then passed into the proximal fragment of the radius using combination of cortical and locking screws.  Intraoperative fluoroscopy revealed adequate reduction in AP, lateral, and oblique view with restoration of normal volar tilt and length.  The Biomet OrthoBlast putting graft was then placed into the osteotomy site.  Prior to this, the wound was irrigated.  After the graft was placed in, it was then closed in layers of 3-0 undyed Vicryl and 4-0 Vicryl Rapide on the skin. Xeroform, 4x4s, fluffs, and a sugar-tong splint was applied.  The patient tolerated the procedure well and went to recovery room in stable fashion.     Sheral Apley Burney Gauze, M.D.     MAW/MEDQ  D:  11/29/2012  T:  11/30/2012  Job:  [id number]

## 2012-12-01 ENCOUNTER — Encounter (HOSPITAL_BASED_OUTPATIENT_CLINIC_OR_DEPARTMENT_OTHER): Payer: Self-pay | Admitting: Orthopedic Surgery

## 2013-01-01 ENCOUNTER — Encounter (HOSPITAL_COMMUNITY): Payer: Self-pay | Admitting: Emergency Medicine

## 2013-01-01 ENCOUNTER — Emergency Department (HOSPITAL_COMMUNITY)
Admission: EM | Admit: 2013-01-01 | Discharge: 2013-01-01 | Disposition: A | Discharge status: Home / Self Care | Payer: Self-pay | Attending: Emergency Medicine | Admitting: Emergency Medicine

## 2013-01-01 ENCOUNTER — Emergency Department (HOSPITAL_COMMUNITY): Payer: Self-pay

## 2013-01-01 DIAGNOSIS — Z8659 Personal history of other mental and behavioral disorders: Secondary | ICD-10-CM | POA: Insufficient documentation

## 2013-01-01 DIAGNOSIS — I10 Essential (primary) hypertension: Secondary | ICD-10-CM | POA: Insufficient documentation

## 2013-01-01 DIAGNOSIS — M25531 Pain in right wrist: Secondary | ICD-10-CM

## 2013-01-01 DIAGNOSIS — Z8739 Personal history of other diseases of the musculoskeletal system and connective tissue: Secondary | ICD-10-CM | POA: Insufficient documentation

## 2013-01-01 DIAGNOSIS — M25539 Pain in unspecified wrist: Secondary | ICD-10-CM | POA: Insufficient documentation

## 2013-01-01 DIAGNOSIS — G8911 Acute pain due to trauma: Secondary | ICD-10-CM | POA: Insufficient documentation

## 2013-01-01 MED ORDER — IBUPROFEN 800 MG PO TABS: 800.0000 mg | ORAL_TABLET | Freq: Three times a day (TID) | ORAL | Status: DC

## 2013-01-01 MED ORDER — OXYCODONE-ACETAMINOPHEN 5-325 MG PO TABS: 2.0000 | ORAL_TABLET | ORAL | Status: DC | PRN

## 2013-01-01 NOTE — ED Notes (Addendum)
Pt states he fell from small ladder on Sat and landed on left wrist. Pt was wearing wrist brace due to previous injury and surgery to correct older injury.   Pt has hx of wrist pain which have increased since fall. Wrist is swollen and red with surgical scar on inside of wrist.  Pain 10/10 with shooting pain in thumb. Pt alert oriented X4

## 2013-01-01 NOTE — ED Notes (Signed)
Pt sts left wrist pain after falling off low ladder on Saturday onto wrist; pt sts hx of sx and was wearing brace but still having pain

## 2013-01-01 NOTE — ED Provider Notes (Signed)
History     CSN: [id number]  Arrival date & time 01/01/13  5621   First MD Initiated Contact with Patient 01/01/13 0913      Chief Complaint  Patient presents with  . Wrist Pain    (Consider location/radiation/quality/duration/timing/severity/associated sxs/prior treatment) HPI Comments: This is 52 year old male who has a history of ORIF in his left forearm. Saturday, he fell a short distance from a ladder and caught himself with his left outstretched arm. He has been having wrist pain since the fall. It is a sharp shooting pain. The pain radiates into his fingers. He was wearing his brace when he fell. He has iced his wrist, but states that made the pain feel worse. He feels like his left thumb is a little numb. No LOC in the fall. Denies HA, abdominal pain, nausea, vomiting.   Patient is a 52 y.o. male presenting with wrist pain. The history is provided by the patient. No language interpreter was used.  Wrist Pain This is a new problem. The current episode started in the past 7 days. The problem occurs constantly. The problem has been gradually worsening. Pertinent negatives include no abdominal pain, fatigue, fever, headaches, nausea or vomiting. He has tried ice and NSAIDs for the symptoms.    Past Medical History  Diagnosis Date  . Hypertension   . Syncope   . Panic disorder   . Arthritis     Past Surgical History  Procedure Laterality Date  . Wrist surgery      fx only  . Wisdom tooth extraction    . Open reduction internal fixation (orif) distal radial fracture Left 11/29/2012    Procedure: OPEN REDUCTION INTERNAL FIXATION (ORIF) DISTAL RADIAL FRACTURE;  Surgeon: Schuyler Amor, MD;  Location: [facility];  Service: Orthopedics;  Laterality: Left;  LEFT DISTAL RADIUS OSTEOTOMY WITH BONE GRAFT    Family History  Problem Relation Age of Onset  . Heart attack Father   . Diabetes Neg Hx   . Hyperlipidemia Neg Hx   . Hypertension Neg Hx     History    Substance Use Topics  . Smoking status: Never Smoker   . Smokeless tobacco: Not on file  . Alcohol Use: No      Review of Systems  Constitutional: Negative for fever and fatigue.  Gastrointestinal: Negative for nausea, vomiting and abdominal pain.  Neurological: Negative for headaches.  All other systems reviewed and are negative.    Allergies  Hydrocodone  Home Medications   Current Outpatient Rx  Name  Route  Sig  Dispense  Refill  . ibuprofen (ADVIL,MOTRIN) 200 MG tablet   Oral   Take 200 mg by mouth daily as needed for pain.          Marland Kitchen ibuprofen (ADVIL,MOTRIN) 800 MG tablet   Oral   Take 1 tablet (800 mg total) by mouth 3 (three) times daily.   21 tablet   0   . oxyCODONE-acetaminophen (PERCOCET/ROXICET) 5-325 MG per tablet   Oral   Take 2 tablets by mouth every 4 (four) hours as needed for pain.   12 tablet   0     BP 134/91  Pulse 82  Temp(Src) 98.7 F (37.1 C) (Oral)  Resp 20  Ht $R'5\' 8"'vW$  (1.727 m)  Wt 170 lb (77.111 kg)  BMI 25.85 kg/m2  SpO2 95%  Physical Exam  Nursing note and vitals reviewed. Constitutional: He is oriented to person, place, and time. Vital signs are  normal. He appears well-developed and well-nourished. He does not have a sickly appearance. No distress.  HENT:  Head: Normocephalic and atraumatic.  Right Ear: External ear normal.  Left Ear: External ear normal.  Nose: Nose normal.  Eyes: Conjunctivae are normal.  Neck: Normal range of motion. No tracheal deviation present.  Cardiovascular: Normal rate, regular rhythm, normal heart sounds and intact distal pulses.  Exam reveals no gallop and no friction rub.   No murmur heard. Pulmonary/Chest: Effort normal and breath sounds normal. No stridor. No respiratory distress. He has no wheezes. He has no rales.  Abdominal: Soft. He exhibits no distension. There is no tenderness.  Musculoskeletal: He exhibits tenderness.       Right wrist: He exhibits decreased range of motion,  tenderness and bony tenderness. He exhibits no deformity.       Arms: Limited ROM of right thumb Diffused tenderness over right wrist which radiates to the metacarpals Well healed 4 cm incision over distal anterior forearm - no erythema, streaking, warmth  Neurological: He is alert and oriented to person, place, and time. No sensory deficit. He exhibits normal muscle tone.  Skin: Skin is warm and dry. He is not diaphoretic. No erythema.  Psychiatric: He has a normal mood and affect. His behavior is normal.    ED Course  Procedures (including critical care time)  Labs Reviewed - No data to display Dg Wrist Complete Left  01/01/2013  *RADIOLOGY REPORT*  Clinical Data: Recent ORIF for wrist fracture.  Now with fall and wrist pain  LEFT WRIST - COMPLETE 3+ VIEW  Comparison: 10/30/2012  Findings: Four view exam of the left wrist shows the patient been status post plate screw fixation for comminuted distal radius fracture.  No gross interval reinjury is evident.  Chronic ulnar styloid fracture noted.  IMPRESSION: Status post ORIF for distal radius fracture without evidence for a gross acute reinjury.   Original Report Authenticated By: Misty Stanley, M.D.      1. Wrist pain, acute, right       MDM  Patient presents today after a fall on wrist s/p ORIF for distal radius fx 1 month ago. XR shows no acute reinjury. Radial pulse intact. No gross neuro deficits. Vital signs stable. Pain controlled in ED. Follow up with Dr. Burney Gauze who performed ORIF this week. Return instructions given. Patient / Family / Caregiver informed of clinical course, understand medical decision-making process, and agree with plan.       Elwyn Lade, PA-C 01/02/13 [phone]

## 2013-01-02 NOTE — ED Provider Notes (Signed)
Medical screening examination/treatment/procedure(s) were performed by non-physician practitioner and as supervising physician I was immediately available for consultation/collaboration.   Mirna Mires, MD 01/02/13 [phone]

## 2013-01-30 ENCOUNTER — Emergency Department: Payer: Self-pay | Admitting: Emergency Medicine

## 2013-02-02 ENCOUNTER — Other Ambulatory Visit: Payer: Self-pay | Admitting: Orthopedic Surgery

## 2013-02-06 ENCOUNTER — Encounter (HOSPITAL_BASED_OUTPATIENT_CLINIC_OR_DEPARTMENT_OTHER): Payer: Self-pay | Admitting: *Deleted

## 2013-02-06 NOTE — Progress Notes (Signed)
Pt here orif wrist 2/14- Did well

## 2013-02-07 ENCOUNTER — Encounter (HOSPITAL_BASED_OUTPATIENT_CLINIC_OR_DEPARTMENT_OTHER): Payer: Self-pay | Admitting: Certified Registered Nurse Anesthetist

## 2013-02-07 ENCOUNTER — Encounter (HOSPITAL_BASED_OUTPATIENT_CLINIC_OR_DEPARTMENT_OTHER): Payer: Self-pay | Admitting: *Deleted

## 2013-02-07 ENCOUNTER — Ambulatory Visit (HOSPITAL_BASED_OUTPATIENT_CLINIC_OR_DEPARTMENT_OTHER): Payer: Self-pay | Admitting: Certified Registered Nurse Anesthetist

## 2013-02-07 ENCOUNTER — Encounter (HOSPITAL_BASED_OUTPATIENT_CLINIC_OR_DEPARTMENT_OTHER)
Admission: RE | Disposition: A | Discharge status: Home / Self Care | Payer: Self-pay | Source: Ambulatory Visit | Attending: Orthopedic Surgery

## 2013-02-07 ENCOUNTER — Ambulatory Visit (HOSPITAL_BASED_OUTPATIENT_CLINIC_OR_DEPARTMENT_OTHER)
Admission: RE | Admit: 2013-02-07 | Discharge: 2013-02-07 | Disposition: A | Discharge status: Home / Self Care | Payer: Self-pay | Source: Ambulatory Visit | Attending: Orthopedic Surgery | Admitting: Orthopedic Surgery

## 2013-02-07 DIAGNOSIS — F410 Panic disorder [episodic paroxysmal anxiety] without agoraphobia: Secondary | ICD-10-CM | POA: Insufficient documentation

## 2013-02-07 DIAGNOSIS — X58XXXA Exposure to other specified factors, initial encounter: Secondary | ICD-10-CM | POA: Insufficient documentation

## 2013-02-07 DIAGNOSIS — S63659A Sprain of metacarpophalangeal joint of unspecified finger, initial encounter: Secondary | ICD-10-CM | POA: Insufficient documentation

## 2013-02-07 DIAGNOSIS — S66812S Strain of other specified muscles, fascia and tendons at wrist and hand level, left hand, sequela: Secondary | ICD-10-CM

## 2013-02-07 DIAGNOSIS — Z885 Allergy status to narcotic agent status: Secondary | ICD-10-CM | POA: Insufficient documentation

## 2013-02-07 DIAGNOSIS — Z79899 Other long term (current) drug therapy: Secondary | ICD-10-CM | POA: Insufficient documentation

## 2013-02-07 DIAGNOSIS — I10 Essential (primary) hypertension: Secondary | ICD-10-CM | POA: Insufficient documentation

## 2013-02-07 DIAGNOSIS — M129 Arthropathy, unspecified: Secondary | ICD-10-CM | POA: Insufficient documentation

## 2013-02-07 DIAGNOSIS — M249 Joint derangement, unspecified: Secondary | ICD-10-CM | POA: Insufficient documentation

## 2013-02-07 HISTORY — DX: Complete loss of teeth, unspecified cause, unspecified class: K08.109

## 2013-02-07 HISTORY — PX: TENDON REPAIR: SHX5111

## 2013-02-07 HISTORY — DX: Complete loss of teeth, unspecified cause, unspecified class: Z97.2

## 2013-02-07 LAB — POCT HEMOGLOBIN-HEMACUE: Hemoglobin: 15.1 g/dL (ref 13.0–17.0)

## 2013-02-07 SURGERY — TENDON REPAIR
Anesthesia: General | Site: Hand | Laterality: Left | Wound class: Clean

## 2013-02-07 MED ORDER — FENTANYL CITRATE 0.05 MG/ML IJ SOLN: 50.0000 ug | INTRAMUSCULAR | Status: DC | PRN

## 2013-02-07 MED ORDER — ONDANSETRON HCL 4 MG/2ML IJ SOLN
INTRAMUSCULAR | Status: DC | PRN
  Administered 2013-02-07: 4 mg via INTRAVENOUS

## 2013-02-07 MED ORDER — OXYCODONE-ACETAMINOPHEN 5-325 MG PO TABS: 1.0000 | ORAL_TABLET | ORAL | Status: DC | PRN

## 2013-02-07 MED ORDER — LIDOCAINE HCL (CARDIAC) 20 MG/ML IV SOLN
INTRAVENOUS | Status: DC | PRN
  Administered 2013-02-07: 60 mg via INTRAVENOUS

## 2013-02-07 MED ORDER — OXYCODONE HCL 5 MG/5ML PO SOLN: 5.0000 mg | Freq: Once | ORAL | Status: AC | PRN

## 2013-02-07 MED ORDER — OXYCODONE HCL 5 MG PO TABS
5.0000 mg | ORAL_TABLET | Freq: Once | ORAL | Status: AC | PRN
  Administered 2013-02-07: 5 mg via ORAL

## 2013-02-07 MED ORDER — MIDAZOLAM HCL 2 MG/2ML IJ SOLN: 1.0000 mg | INTRAMUSCULAR | Status: DC | PRN

## 2013-02-07 MED ORDER — LACTATED RINGERS IV SOLN
INTRAVENOUS | Status: DC
  Administered 2013-02-07 (×2): via INTRAVENOUS
  Administered 2013-02-07: 20 mL/h via INTRAVENOUS

## 2013-02-07 MED ORDER — ONDANSETRON HCL 4 MG/2ML IJ SOLN: 4.0000 mg | Freq: Once | INTRAMUSCULAR | Status: DC | PRN

## 2013-02-07 MED ORDER — BUPIVACAINE HCL (PF) 0.25 % IJ SOLN
INTRAMUSCULAR | Status: DC | PRN
  Administered 2013-02-07: 10 mL

## 2013-02-07 MED ORDER — HYDROMORPHONE HCL PF 1 MG/ML IJ SOLN
0.2500 mg | INTRAMUSCULAR | Status: DC | PRN
  Administered 2013-02-07 (×4): 0.5 mg via INTRAVENOUS

## 2013-02-07 MED ORDER — HYDROMORPHONE HCL PF 1 MG/ML IJ SOLN
0.2500 mg | INTRAMUSCULAR | Status: DC | PRN
  Administered 2013-02-07 (×2): 0.5 mg via INTRAVENOUS

## 2013-02-07 MED ORDER — MIDAZOLAM HCL 5 MG/5ML IJ SOLN
INTRAMUSCULAR | Status: DC | PRN
  Administered 2013-02-07: 2 mg via INTRAVENOUS

## 2013-02-07 MED ORDER — CHLORHEXIDINE GLUCONATE 4 % EX LIQD: 60.0000 mL | Freq: Once | CUTANEOUS | Status: DC

## 2013-02-07 MED ORDER — CEFAZOLIN SODIUM-DEXTROSE 2-3 GM-% IV SOLR
2.0000 g | INTRAVENOUS | Status: AC
  Administered 2013-02-07: 2 g via INTRAVENOUS

## 2013-02-07 MED ORDER — FENTANYL CITRATE 0.05 MG/ML IJ SOLN
INTRAMUSCULAR | Status: DC | PRN
  Administered 2013-02-07 (×2): 50 ug via INTRAVENOUS
  Administered 2013-02-07: 25 ug via INTRAVENOUS

## 2013-02-07 MED ORDER — PROPOFOL 10 MG/ML IV BOLUS
INTRAVENOUS | Status: DC | PRN
  Administered 2013-02-07: 200 mg via INTRAVENOUS

## 2013-02-07 MED ORDER — DEXAMETHASONE SODIUM PHOSPHATE 10 MG/ML IJ SOLN
INTRAMUSCULAR | Status: DC | PRN
  Administered 2013-02-07: 10 mg via INTRAVENOUS

## 2013-02-07 SURGICAL SUPPLY — 53 items
APL SKNCLS STERI-STRIP NONHPOA (GAUZE/BANDAGES/DRESSINGS)
BANDAGE ELASTIC 4 VELCRO ST LF (GAUZE/BANDAGES/DRESSINGS) IMPLANT
BANDAGE GAUZE ELAST BULKY 4 IN (GAUZE/BANDAGES/DRESSINGS) ×2 IMPLANT
BENZOIN TINCTURE PRP APPL 2/3 (GAUZE/BANDAGES/DRESSINGS) IMPLANT
BLADE SURG 15 STRL LF DISP TIS (BLADE) ×1 IMPLANT
BLADE SURG 15 STRL SS (BLADE) ×2
BNDG CMPR 9X4 STRL LF SNTH (GAUZE/BANDAGES/DRESSINGS)
BNDG CMPR MD 5X2 ELC HKLP STRL (GAUZE/BANDAGES/DRESSINGS)
BNDG ELASTIC 2 VLCR STRL LF (GAUZE/BANDAGES/DRESSINGS) IMPLANT
BNDG ESMARK 4X9 LF (GAUZE/BANDAGES/DRESSINGS) IMPLANT
BRUSH SCRUB EZ PLAIN DRY (MISCELLANEOUS) IMPLANT
CLOTH BEACON ORANGE TIMEOUT ST (SAFETY) ×2 IMPLANT
CORDS BIPOLAR (ELECTRODE) ×2 IMPLANT
COVER TABLE BACK 60X90 (DRAPES) ×2 IMPLANT
CUFF TOURNIQUET SINGLE 18IN (TOURNIQUET CUFF) IMPLANT
DECANTER SPIKE VIAL GLASS SM (MISCELLANEOUS) IMPLANT
DRAPE EXTREMITY T 121X128X90 (DRAPE) ×2 IMPLANT
DRAPE SURG 17X23 STRL (DRAPES) ×2 IMPLANT
DURAPREP 26ML APPLICATOR (WOUND CARE) ×2 IMPLANT
GAUZE XEROFORM 1X8 LF (GAUZE/BANDAGES/DRESSINGS) IMPLANT
GLOVE BIO SURGEON STRL SZ 6.5 (GLOVE) ×2 IMPLANT
GLOVE BIO SURGEON STRL SZ8 (GLOVE) ×2 IMPLANT
GLOVE BIOGEL PI IND STRL 7.0 (GLOVE) ×1 IMPLANT
GLOVE BIOGEL PI INDICATOR 7.0 (GLOVE) ×1
GOWN PREVENTION PLUS XLARGE (GOWN DISPOSABLE) ×2 IMPLANT
GOWN PREVENTION PLUS XXLARGE (GOWN DISPOSABLE) ×2 IMPLANT
NEEDLE HYPO 25X1 1.5 SAFETY (NEEDLE) IMPLANT
NS IRRIG 1000ML POUR BTL (IV SOLUTION) ×2 IMPLANT
PACK BASIN DAY SURGERY FS (CUSTOM PROCEDURE TRAY) ×2 IMPLANT
PAD CAST 4YDX4 CTTN HI CHSV (CAST SUPPLIES) ×1 IMPLANT
PADDING CAST ABS 4INX4YD NS (CAST SUPPLIES) ×1
PADDING CAST ABS COTTON 4X4 ST (CAST SUPPLIES) ×1 IMPLANT
PADDING CAST COTTON 4X4 STRL (CAST SUPPLIES) ×2
PADDING UNDERCAST 2  STERILE (CAST SUPPLIES) IMPLANT
SHEET MEDIUM DRAPE 40X70 STRL (DRAPES) ×2 IMPLANT
SLEEVE SCD COMPRESS KNEE MED (MISCELLANEOUS) IMPLANT
SPONGE GAUZE 4X4 12PLY (GAUZE/BANDAGES/DRESSINGS) ×2 IMPLANT
STOCKINETTE 4X48 STRL (DRAPES) ×2 IMPLANT
STRIP CLOSURE SKIN 1/2X4 (GAUZE/BANDAGES/DRESSINGS) IMPLANT
SUT ETHIBOND 3-0 V-5 (SUTURE) IMPLANT
SUT ETHILON 4 0 PS 2 18 (SUTURE) IMPLANT
SUT ETHILON 5 0 PS 2 18 (SUTURE) IMPLANT
SUT PROLENE 3 0 PS 2 (SUTURE) IMPLANT
SUT PROLENE 6 0 PC 1 (SUTURE) IMPLANT
SUT SILK 4 0 PS 2 (SUTURE) IMPLANT
SUT VIC AB 4-0 P-3 18XBRD (SUTURE) IMPLANT
SUT VIC AB 4-0 P3 18 (SUTURE)
SUT VICRYL RAPIDE 4/0 PS 2 (SUTURE) IMPLANT
SYR BULB 3OZ (MISCELLANEOUS) ×2 IMPLANT
SYRINGE 10CC LL (SYRINGE) IMPLANT
TOWEL OR 17X24 6PK STRL BLUE (TOWEL DISPOSABLE) ×2 IMPLANT
TRAY DSU PREP LF (CUSTOM PROCEDURE TRAY) IMPLANT
UNDERPAD 30X30 INCONTINENT (UNDERPADS AND DIAPERS) ×2 IMPLANT

## 2013-02-07 NOTE — Op Note (Signed)
See dictated note [phone]

## 2013-02-07 NOTE — Anesthesia Postprocedure Evaluation (Signed)
  Anesthesia Post-op Note  Patient: Connor Solomon  Procedure(s) Performed: Procedure(s): LEFT EXTENSOR INDICIS PROPRIUS  TO EXTENSOR POLLICIS LONGUS TENDON TRANSFER (Left)  Patient Location: PACU  Anesthesia Type:General  Level of Consciousness: awake, alert  and oriented  Airway and Oxygen Therapy: Patient Spontanous Breathing and Patient connected to face mask oxygen  Post-op Pain: mild  Post-op Assessment: Post-op Vital signs reviewed  Post-op Vital Signs: Reviewed  Complications: No apparent anesthesia complications

## 2013-02-07 NOTE — Anesthesia Procedure Notes (Signed)
Procedure Name: LMA Insertion Date/Time: 02/07/2013 8:36 AM Performed by: Alec Mcphee D Pre-anesthesia Checklist: Patient identified, Emergency Drugs available, Suction available and Patient being monitored Patient Re-evaluated:Patient Re-evaluated prior to inductionOxygen Delivery Method: Circle System Utilized Preoxygenation: Pre-oxygenation with 100% oxygen Intubation Type: IV induction Ventilation: Mask ventilation without difficulty LMA: LMA inserted LMA Size: 4.0 Number of attempts: 1 Airway Equipment and Method: bite block Placement Confirmation: positive ETCO2 Tube secured with: Tape Dental Injury: Teeth and Oropharynx as per pre-operative assessment

## 2013-02-07 NOTE — Transfer of Care (Signed)
Immediate Anesthesia Transfer of Care Note  Patient: Connor Solomon  Procedure(s) Performed: Procedure(s): LEFT EXTENSOR INDICIS PROPRIUS  TO EXTENSOR POLLICIS LONGUS TENDON TRANSFER (Left)  Patient Location: PACU  Anesthesia Type:General  Level of Consciousness: awake and patient cooperative  Airway & Oxygen Therapy: Patient Spontanous Breathing and Patient connected to face mask oxygen  Post-op Assessment: Report given to PACU RN and Post -op Vital signs reviewed and stable  Post vital signs: Reviewed and stable  Complications: No apparent anesthesia complications

## 2013-02-07 NOTE — Anesthesia Preprocedure Evaluation (Addendum)
Anesthesia Evaluation  Patient identified by MRN, date of birth, ID band Patient awake and Patient unresponsive    Reviewed: Allergy & Precautions, NPO status   Airway Mallampati: I TM Distance: >3 FB Neck ROM: Full    Dental  (+) Upper Dentures and Lower Dentures   Pulmonary  breath sounds clear to auscultation        Cardiovascular hypertension, Pt. on medications Rhythm:Regular Rate:Normal     Neuro/Psych PSYCHIATRIC DISORDERS Anxiety    GI/Hepatic   Endo/Other    Renal/GU      Musculoskeletal   Abdominal   Peds  Hematology   Anesthesia Other Findings   Reproductive/Obstetrics                          Anesthesia Physical Anesthesia Plan  ASA: II  Anesthesia Plan: General   Post-op Pain Management:    Induction: Intravenous  Airway Management Planned: LMA  Additional Equipment:   Intra-op Plan:   Post-operative Plan: Extubation in OR  Informed Consent: I have reviewed the patients History and Physical, chart, labs and discussed the procedure including the risks, benefits and alternatives for the proposed anesthesia with the patient or authorized representative who has indicated his/her understanding and acceptance.   Dental advisory given  Plan Discussed with: CRNA, Anesthesiologist and Surgeon  Anesthesia Plan Comments:         Anesthesia Quick Evaluation

## 2013-02-07 NOTE — H&P (Signed)
Connor Solomon is an 52 y.o. male.   Chief Complaint: left thumb loss of motion HPI: as above s/p post orif radius with EPL rupture  Past Medical History  Diagnosis Date  . Hypertension   . Syncope   . Panic disorder   . Arthritis   . Full dentures     Past Surgical History  Procedure Laterality Date  . Wrist surgery      fx only  . Wisdom tooth extraction    . Open reduction internal fixation (orif) distal radial fracture Left 11/29/2012    Procedure: OPEN REDUCTION INTERNAL FIXATION (ORIF) DISTAL RADIAL FRACTURE;  Surgeon: Schuyler Amor, MD;  Location: [facility];  Service: Orthopedics;  Laterality: Left;  LEFT DISTAL RADIUS OSTEOTOMY WITH BONE GRAFT    Family History  Problem Relation Age of Onset  . Heart attack Father   . Diabetes Neg Hx   . Hyperlipidemia Neg Hx   . Hypertension Neg Hx    Social History:  reports that he has never smoked. He does not have any smokeless tobacco history on file. He reports that he does not drink alcohol or use illicit drugs.  Allergies:  Allergies  Allergen Reactions  . Hydrocodone Itching and Nausea And Vomiting    Medications Prior to Admission  Medication Sig Dispense Refill  . ibuprofen (ADVIL,MOTRIN) 200 MG tablet Take 200 mg by mouth daily as needed for pain.       Marland Kitchen ibuprofen (ADVIL,MOTRIN) 800 MG tablet Take 1 tablet (800 mg total) by mouth 3 (three) times daily.  21 tablet  0  . oxyCODONE-acetaminophen (PERCOCET/ROXICET) 5-325 MG per tablet Take 2 tablets by mouth every 4 (four) hours as needed for pain.  12 tablet  0    No results found for this or any previous visit (from the past 48 hour(s)). No results found.  Review of Systems  All other systems reviewed and are negative.    Blood pressure 142/87, pulse 80, temperature 97.7 F (36.5 C), temperature source Oral, resp. rate 18, height $RemoveBe'5\' 8"'mNWUWQGYt$  (1.727 m), weight 75.297 kg (166 lb), SpO2 100.00%. Physical Exam  Constitutional: He is oriented to  person, place, and time. He appears well-developed and well-nourished.  HENT:  Head: Normocephalic and atraumatic.  Cardiovascular: Normal rate.   Respiratory: Effort normal.  Neurological: He is alert and oriented to person, place, and time.  Skin: Skin is warm.  Psychiatric: He has a normal mood and affect. His behavior is normal. Judgment and thought content normal.     Assessment/Plan As above  Plan EIP to EPL transfer  Ayr A 02/07/2013, 8:19 AM

## 2013-02-08 ENCOUNTER — Encounter (HOSPITAL_BASED_OUTPATIENT_CLINIC_OR_DEPARTMENT_OTHER): Payer: Self-pay | Admitting: Orthopedic Surgery

## 2013-02-08 NOTE — Op Note (Signed)
NAMERONTE, PARKER NO.:  [id number]  MEDICAL RECORD NO.:  [id number]  LOCATION:                                 FACILITY:  PHYSICIAN:  Sheral Apley. Chase Arnall, M.D.DATE OF BIRTH:  03-Jul-1961  DATE OF PROCEDURE:  02/07/2013 DATE OF DISCHARGE:                              OPERATIVE REPORT   PREOPERATIVE DIAGNOSIS:  Left thumb EPL tendon rupture.  POSTOPERATIVE DIAGNOSIS:  Left thumb EPL tendon rupture.  PROCEDURE:  EIP to EPL tendon transfer with EPB tenodesis.  SURGEON:  Sheral Apley. Burney Gauze, M.D.  ASSISTANT:  None.  ANESTHESIA:  General.  TOURNIQUET TIME:  33 minutes.  COMPLICATIONS:  No complication.  DRAINS:  No drains.  PROCEDURE IN DETAIL:  The patient was taken to operating suite after induction of adequate general anesthesia, left upper extremity was prepped and draped in sterile fashion.  Esmarch was used to exsanguinate the limb.  Tourniquet was inflated to 250 mmHg.  At this point in time, a transverse incision was made over the left index finger metacarpophalangeal joint, 1 cm proximal to the joint line.  Skin was incised transversely.  The EIP tendon was identified.  A 2nd incision was made over the distal aspect of the extensor retinaculum just distal to the area of Lister's tubercle.  Skin incised sharply dissection was carried down to the area of the EDC to the index and EIP.  These were carefully isolated.  The EIP then distally was transected.  The tail was sewn to the EDC to the index finger with 3-0 FiberWire.  The EIP tendon was then pulled into the proximal wound.  Third incision was made over the mid shaft area of the thumb metacarpal.  Skin was incised sharply. Dissection was carried down to the EPL tendon, which was identified.  It was ruptured and thickened proximally.  It was drawn into the distal aspect of the wound.  The ruptured and thickened area was sharply resected with a #15 blade.  At this point in time, the EIP was  then routed subcutaneously into the thumb wound and with the thumb in maximum abduction and extension of the IP joint and MP joint.  The EIP was transferred to the EPL using a Pulvertaft weave using 3-0 FiberWire suture.  We also tenodesed the EPB to the EPL distally using the FiberWire suture to achieve maximum abduction.  The wounds were then thoroughly irrigated and closed with 4-0 Vicryl Rapide. Xeroform, 4x4s, fluffs, and a radial gutter splint with the thumb in maximal extension was applied.  The patient tolerated the procedure well, went to the recovery room in a stable fashion.     Sheral Apley Burney Gauze, M.D.     MAW/MEDQ  D:  02/07/2013  T:  02/08/2013  Job:  [id number]

## 2013-02-12 ENCOUNTER — Ambulatory Visit: Payer: Self-pay | Attending: Orthopedic Surgery | Admitting: Occupational Therapy

## 2013-02-12 DIAGNOSIS — IMO0001 Reserved for inherently not codable concepts without codable children: Secondary | ICD-10-CM | POA: Insufficient documentation

## 2013-02-12 DIAGNOSIS — M25549 Pain in joints of unspecified hand: Secondary | ICD-10-CM | POA: Insufficient documentation

## 2013-02-14 ENCOUNTER — Ambulatory Visit: Payer: Self-pay | Admitting: Occupational Therapy

## 2013-02-15 ENCOUNTER — Ambulatory Visit: Payer: Self-pay | Admitting: Occupational Therapy

## 2013-02-21 ENCOUNTER — Ambulatory Visit: Payer: Self-pay | Admitting: *Deleted

## 2013-02-27 ENCOUNTER — Ambulatory Visit: Payer: Self-pay | Admitting: Occupational Therapy

## 2013-02-28 ENCOUNTER — Emergency Department (HOSPITAL_COMMUNITY)
Admission: EM | Admit: 2013-02-28 | Discharge: 2013-02-28 | Disposition: A | Discharge status: Home / Self Care | Payer: Self-pay | Attending: Emergency Medicine | Admitting: Emergency Medicine

## 2013-02-28 ENCOUNTER — Encounter (HOSPITAL_COMMUNITY): Payer: Self-pay

## 2013-02-28 ENCOUNTER — Emergency Department (HOSPITAL_COMMUNITY): Payer: Self-pay

## 2013-02-28 DIAGNOSIS — Y9289 Other specified places as the place of occurrence of the external cause: Secondary | ICD-10-CM | POA: Insufficient documentation

## 2013-02-28 DIAGNOSIS — Z98811 Dental restoration status: Secondary | ICD-10-CM | POA: Insufficient documentation

## 2013-02-28 DIAGNOSIS — I10 Essential (primary) hypertension: Secondary | ICD-10-CM | POA: Insufficient documentation

## 2013-02-28 DIAGNOSIS — S8392XA Sprain of unspecified site of left knee, initial encounter: Secondary | ICD-10-CM

## 2013-02-28 DIAGNOSIS — Z8659 Personal history of other mental and behavioral disorders: Secondary | ICD-10-CM | POA: Insufficient documentation

## 2013-02-28 DIAGNOSIS — Y990 Civilian activity done for income or pay: Secondary | ICD-10-CM | POA: Insufficient documentation

## 2013-02-28 DIAGNOSIS — X500XXA Overexertion from strenuous movement or load, initial encounter: Secondary | ICD-10-CM | POA: Insufficient documentation

## 2013-02-28 DIAGNOSIS — IMO0002 Reserved for concepts with insufficient information to code with codable children: Secondary | ICD-10-CM | POA: Insufficient documentation

## 2013-02-28 DIAGNOSIS — M129 Arthropathy, unspecified: Secondary | ICD-10-CM | POA: Insufficient documentation

## 2013-02-28 DIAGNOSIS — Y9389 Activity, other specified: Secondary | ICD-10-CM | POA: Insufficient documentation

## 2013-02-28 MED ORDER — OXYCODONE-ACETAMINOPHEN 5-325 MG PO TABS
1.0000 | ORAL_TABLET | Freq: Once | ORAL | Status: AC
  Administered 2013-02-28: 1 via ORAL
  Filled 2013-02-28: qty 1

## 2013-02-28 NOTE — ED Notes (Signed)
Pt twisted his left knee while at work yesterday afternoon about 1500. Put ice on it yesterday but still hurting. Able to bear weight but hurts.

## 2013-02-28 NOTE — ED Provider Notes (Signed)
History     CSN: [id number]  Arrival date & time 02/28/13  0830   First MD Initiated Contact with Patient 02/28/13 0845      Chief Complaint  Patient presents with  . Knee Pain    (Consider location/radiation/quality/duration/timing/severity/associated sxs/prior treatment) Patient is a 52 y.o. male presenting with knee pain. The history is provided by the patient.  Knee Pain Location:  Knee Associated symptoms: no back pain and no fever   patient states that he twisted his left knee while at work yesterday. He states he was carrying something heavy and then he twisted. He states this was a yesterday and he has had pain since. He states no relief with ice or naproxen. Patient states it hurts to take a sleeping pill. When asked if he took a Percocet, the patient was surprised and said no. When asked why not because it was painful he said he didn't have any. Patient has had 110 pills of Percocet filled in the last 15 days. Patient states he doesn't have any. He states he left them somewhere. No other injury. He has chronic pain in his left wrist.  Past Medical History  Diagnosis Date  . Hypertension   . Syncope   . Panic disorder   . Arthritis   . Full dentures     Past Surgical History  Procedure Laterality Date  . Wrist surgery      fx only  . Wisdom tooth extraction    . Open reduction internal fixation (orif) distal radial fracture Left 11/29/2012    Procedure: OPEN REDUCTION INTERNAL FIXATION (ORIF) DISTAL RADIAL FRACTURE;  Surgeon: Schuyler Amor, MD;  Location: [place] [facility];  Service: Orthopedics;  Laterality: Left;  LEFT DISTAL RADIUS OSTEOTOMY WITH BONE GRAFT  . Tendon repair Left 02/07/2013    Procedure: LEFT EXTENSOR INDICIS PROPRIUS  TO EXTENSOR POLLICIS LONGUS TENDON TRANSFER;  Surgeon: Schuyler Amor, MD;  Location: [place];  Service: Orthopedics;  Laterality: Left;    Family History  Problem Relation Age of Onset  . Heart  attack Father   . Diabetes Neg Hx   . Hyperlipidemia Neg Hx   . Hypertension Neg Hx     History  Substance Use Topics  . Smoking status: Never Smoker   . Smokeless tobacco: Not on file  . Alcohol Use: No      Review of Systems  Constitutional: Negative for fever.  Musculoskeletal: Positive for gait problem. Negative for myalgias, back pain, joint swelling and arthralgias.  Skin: Negative for wound.  Neurological: Negative for weakness and numbness.    Allergies  Hydrocodone  Home Medications   Current Outpatient Rx  Name  Route  Sig  Dispense  Refill  . ibuprofen (ADVIL,MOTRIN) 200 MG tablet   Oral   Take 400 mg by mouth 2 (two) times daily as needed for pain.           BP 132/87  Pulse 94  Temp(Src) 97.5 F (36.4 C)  Resp 20  SpO2 98%  Physical Exam  Constitutional: He appears well-developed and well-nourished.  Musculoskeletal:  Left wrist in splint. Tenderness and pain to palpation everywhere on left knee. No effusion. No ecchymosis. Neurovascular intact distally.  Neurological: He is alert.  Skin: Skin is warm. No rash noted. No erythema.    ED Course  Procedures (including critical care time)  Labs Reviewed - No data to display Dg Knee Complete 4 Views Left  02/28/2013   *RADIOLOGY REPORT*  Clinical Data: Knee pain post twisting injury  LEFT KNEE - COMPLETE 4+ VIEW  Comparison: None.  Findings: Four views of the left knee submitted.  No acute fracture or subluxation.  Mild soft tissue swelling medially.  No joint effusion.  IMPRESSION: No acute fracture or subluxation.  Mild soft tissue swelling medially.   Original Report Authenticated By: Lahoma Crocker, M.D.     1. Knee sprain and strain, left, initial encounter       MDM  Patient with knee pain after twisting injury. X-ray does not show effusion.patient was given an immobilizer and will followup with Ortho. He does have chronic pain medicines although he denied having any. He'll be discharged  home        Jasper Riling. Alvino Chapel, MD 02/28/13 1021

## 2013-03-08 ENCOUNTER — Ambulatory Visit: Payer: Self-pay | Admitting: Occupational Therapy

## 2013-03-15 ENCOUNTER — Ambulatory Visit: Payer: Self-pay | Admitting: Occupational Therapy

## 2013-05-23 ENCOUNTER — Encounter (HOSPITAL_BASED_OUTPATIENT_CLINIC_OR_DEPARTMENT_OTHER): Payer: Self-pay | Admitting: *Deleted

## 2013-05-24 ENCOUNTER — Ambulatory Visit: Payer: Self-pay | Admitting: Occupational Therapy

## 2013-05-29 ENCOUNTER — Other Ambulatory Visit: Payer: Self-pay | Admitting: Orthopedic Surgery

## 2013-05-30 ENCOUNTER — Encounter (HOSPITAL_BASED_OUTPATIENT_CLINIC_OR_DEPARTMENT_OTHER): Payer: Self-pay | Admitting: Certified Registered Nurse Anesthetist

## 2013-05-30 ENCOUNTER — Ambulatory Visit (HOSPITAL_BASED_OUTPATIENT_CLINIC_OR_DEPARTMENT_OTHER)
Admission: RE | Admit: 2013-05-30 | Discharge: 2013-05-30 | Disposition: A | Discharge status: Home / Self Care | Payer: Self-pay | Source: Ambulatory Visit | Attending: Orthopedic Surgery | Admitting: Orthopedic Surgery

## 2013-05-30 ENCOUNTER — Ambulatory Visit (HOSPITAL_BASED_OUTPATIENT_CLINIC_OR_DEPARTMENT_OTHER): Payer: Self-pay | Admitting: Certified Registered Nurse Anesthetist

## 2013-05-30 ENCOUNTER — Encounter (HOSPITAL_BASED_OUTPATIENT_CLINIC_OR_DEPARTMENT_OTHER)
Admission: RE | Disposition: A | Discharge status: Home / Self Care | Payer: Self-pay | Source: Ambulatory Visit | Attending: Orthopedic Surgery

## 2013-05-30 DIAGNOSIS — M19039 Primary osteoarthritis, unspecified wrist: Secondary | ICD-10-CM | POA: Insufficient documentation

## 2013-05-30 DIAGNOSIS — M19049 Primary osteoarthritis, unspecified hand: Secondary | ICD-10-CM | POA: Insufficient documentation

## 2013-05-30 DIAGNOSIS — Z87442 Personal history of urinary calculi: Secondary | ICD-10-CM | POA: Insufficient documentation

## 2013-05-30 DIAGNOSIS — F410 Panic disorder [episodic paroxysmal anxiety] without agoraphobia: Secondary | ICD-10-CM | POA: Insufficient documentation

## 2013-05-30 DIAGNOSIS — Z885 Allergy status to narcotic agent status: Secondary | ICD-10-CM | POA: Insufficient documentation

## 2013-05-30 DIAGNOSIS — I10 Essential (primary) hypertension: Secondary | ICD-10-CM | POA: Insufficient documentation

## 2013-05-30 DIAGNOSIS — M24849 Other specific joint derangements of unspecified hand, not elsewhere classified: Secondary | ICD-10-CM | POA: Insufficient documentation

## 2013-05-30 DIAGNOSIS — M25342 Other instability, left hand: Secondary | ICD-10-CM

## 2013-05-30 HISTORY — PX: CARPAL METACARPAL FUSION WITH DISTAL RADIAL BONE GRAFT: SHX6291

## 2013-05-30 HISTORY — DX: Calculus of kidney: N20.0

## 2013-05-30 LAB — POCT HEMOGLOBIN-HEMACUE: Hemoglobin: 13.8 g/dL (ref 13.0–17.0)

## 2013-05-30 SURGERY — FUSION, INTERCARPAL JOINT, USING BONE GRAFT
Anesthesia: General | Site: Hand | Laterality: Left | Wound class: Clean

## 2013-05-30 MED ORDER — HYDROMORPHONE HCL PF 1 MG/ML IJ SOLN: 0.2500 mg | INTRAMUSCULAR | Status: DC | PRN

## 2013-05-30 MED ORDER — PROPOFOL 10 MG/ML IV BOLUS
INTRAVENOUS | Status: DC | PRN
  Administered 2013-05-30: 200 mg via INTRAVENOUS

## 2013-05-30 MED ORDER — MEPERIDINE HCL 25 MG/ML IJ SOLN: 6.2500 mg | INTRAMUSCULAR | Status: DC | PRN

## 2013-05-30 MED ORDER — CEFAZOLIN SODIUM-DEXTROSE 2-3 GM-% IV SOLR
2.0000 g | INTRAVENOUS | Status: AC
  Administered 2013-05-30: 2 g via INTRAVENOUS

## 2013-05-30 MED ORDER — DEXAMETHASONE SODIUM PHOSPHATE 10 MG/ML IJ SOLN
INTRAMUSCULAR | Status: DC | PRN
  Administered 2013-05-30: 10 mg via INTRAVENOUS

## 2013-05-30 MED ORDER — LACTATED RINGERS IV SOLN
INTRAVENOUS | Status: DC
  Administered 2013-05-30: 09:00:00 via INTRAVENOUS

## 2013-05-30 MED ORDER — HYDROMORPHONE HCL PF 1 MG/ML IJ SOLN
0.2500 mg | INTRAMUSCULAR | Status: DC | PRN
  Administered 2013-05-30 (×5): 0.5 mg via INTRAVENOUS

## 2013-05-30 MED ORDER — OXYCODONE HCL 5 MG/5ML PO SOLN: 5.0000 mg | Freq: Once | ORAL | Status: AC | PRN

## 2013-05-30 MED ORDER — BUPIVACAINE HCL (PF) 0.25 % IJ SOLN
INTRAMUSCULAR | Status: DC | PRN
  Administered 2013-05-30: 10 mL

## 2013-05-30 MED ORDER — OXYCODONE HCL 5 MG PO TABS
5.0000 mg | ORAL_TABLET | Freq: Once | ORAL | Status: AC | PRN
  Administered 2013-05-30: 5 mg via ORAL

## 2013-05-30 MED ORDER — FENTANYL CITRATE 0.05 MG/ML IJ SOLN: 50.0000 ug | INTRAMUSCULAR | Status: DC | PRN

## 2013-05-30 MED ORDER — LIDOCAINE HCL (CARDIAC) 20 MG/ML IV SOLN
INTRAVENOUS | Status: DC | PRN
  Administered 2013-05-30: 100 mg via INTRAVENOUS

## 2013-05-30 MED ORDER — FENTANYL CITRATE 0.05 MG/ML IJ SOLN
INTRAMUSCULAR | Status: DC | PRN
  Administered 2013-05-30: 50 ug via INTRAVENOUS
  Administered 2013-05-30: 100 ug via INTRAVENOUS
  Administered 2013-05-30: 50 ug via INTRAVENOUS

## 2013-05-30 MED ORDER — ONDANSETRON HCL 4 MG/2ML IJ SOLN
INTRAMUSCULAR | Status: DC | PRN
  Administered 2013-05-30: 4 mg via INTRAVENOUS

## 2013-05-30 MED ORDER — MIDAZOLAM HCL 2 MG/2ML IJ SOLN: 1.0000 mg | INTRAMUSCULAR | Status: DC | PRN

## 2013-05-30 MED ORDER — CHLORHEXIDINE GLUCONATE 4 % EX LIQD: 60.0000 mL | Freq: Once | CUTANEOUS | Status: DC

## 2013-05-30 MED ORDER — MIDAZOLAM HCL 2 MG/2ML IJ SOLN
1.0000 mg | Freq: Once | INTRAMUSCULAR | Status: AC
  Administered 2013-05-30: 1 mg via INTRAVENOUS

## 2013-05-30 MED ORDER — MIDAZOLAM HCL 5 MG/5ML IJ SOLN
INTRAMUSCULAR | Status: DC | PRN
  Administered 2013-05-30: 2 mg via INTRAVENOUS

## 2013-05-30 MED ORDER — OXYCODONE-ACETAMINOPHEN 5-325 MG PO TABS: 1.0000 | ORAL_TABLET | ORAL | Status: DC | PRN

## 2013-05-30 MED ORDER — ONDANSETRON HCL 4 MG/2ML IJ SOLN: 4.0000 mg | Freq: Once | INTRAMUSCULAR | Status: DC | PRN

## 2013-05-30 SURGICAL SUPPLY — 65 items
APL SKNCLS STERI-STRIP NONHPOA (GAUZE/BANDAGES/DRESSINGS)
BANDAGE ELASTIC 3 VELCRO ST LF (GAUZE/BANDAGES/DRESSINGS) ×2 IMPLANT
BANDAGE ELASTIC 4 VELCRO ST LF (GAUZE/BANDAGES/DRESSINGS) ×1 IMPLANT
BANDAGE GAUZE ELAST BULKY 4 IN (GAUZE/BANDAGES/DRESSINGS) ×1 IMPLANT
BENZOIN TINCTURE PRP APPL 2/3 (GAUZE/BANDAGES/DRESSINGS) IMPLANT
BIT DRILL ACUTRAK 2 MINI (BIT) ×1 IMPLANT
BLADE SURG 15 STRL LF DISP TIS (BLADE) ×1 IMPLANT
BLADE SURG 15 STRL SS (BLADE) ×2
BNDG CMPR 9X4 STRL LF SNTH (GAUZE/BANDAGES/DRESSINGS) ×1
BNDG CMPR MD 5X2 ELC HKLP STRL (GAUZE/BANDAGES/DRESSINGS)
BNDG ELASTIC 2 VLCR STRL LF (GAUZE/BANDAGES/DRESSINGS) IMPLANT
BNDG ESMARK 4X9 LF (GAUZE/BANDAGES/DRESSINGS) ×2 IMPLANT
CANISTER SUCTION 1200CC (MISCELLANEOUS) IMPLANT
CLOTH BEACON ORANGE TIMEOUT ST (SAFETY) ×2 IMPLANT
CORDS BIPOLAR (ELECTRODE) ×1 IMPLANT
COVER TABLE BACK 60X90 (DRAPES) ×2 IMPLANT
CUFF TOURNIQUET SINGLE 18IN (TOURNIQUET CUFF) IMPLANT
DECANTER SPIKE VIAL GLASS SM (MISCELLANEOUS) IMPLANT
DRAPE EXTREMITY T 121X128X90 (DRAPE) ×2 IMPLANT
DRAPE OEC MINIVIEW 54X84 (DRAPES) ×2 IMPLANT
DRAPE SURG 17X23 STRL (DRAPES) ×2 IMPLANT
DRILL ACUTRAK 2 MINI (BIT) ×2
DURAPREP 26ML APPLICATOR (WOUND CARE) ×2 IMPLANT
GAUZE SPONGE 4X4 16PLY XRAY LF (GAUZE/BANDAGES/DRESSINGS) IMPLANT
GAUZE XEROFORM 1X8 LF (GAUZE/BANDAGES/DRESSINGS) ×1 IMPLANT
GLOVE BIO SURGEON STRL SZ 6.5 (GLOVE) ×2 IMPLANT
GLOVE BIO SURGEON STRL SZ8 (GLOVE) ×2 IMPLANT
GLOVE BIOGEL PI IND STRL 7.0 (GLOVE) IMPLANT
GLOVE BIOGEL PI INDICATOR 7.0 (GLOVE) ×1
GOWN BRE IMP PREV XXLGXLNG (GOWN DISPOSABLE) ×2 IMPLANT
GOWN PREVENTION PLUS XLARGE (GOWN DISPOSABLE) ×2 IMPLANT
GUIDEWIRE ORTHO MINI ACTK .045 (WIRE) ×1 IMPLANT
NDL HYPO 25X1 1.5 SAFETY (NEEDLE) IMPLANT
NEEDLE HYPO 25X1 1.5 SAFETY (NEEDLE) IMPLANT
NS IRRIG 1000ML POUR BTL (IV SOLUTION) ×1 IMPLANT
PACK BASIN DAY SURGERY FS (CUSTOM PROCEDURE TRAY) ×2 IMPLANT
PAD CAST 3X4 CTTN HI CHSV (CAST SUPPLIES) ×1 IMPLANT
PAD CAST 4YDX4 CTTN HI CHSV (CAST SUPPLIES) IMPLANT
PADDING CAST ABS 4INX4YD NS (CAST SUPPLIES) ×1
PADDING CAST ABS COTTON 4X4 ST (CAST SUPPLIES) ×1 IMPLANT
PADDING CAST COTTON 3X4 STRL (CAST SUPPLIES) ×2
PADDING CAST COTTON 4X4 STRL (CAST SUPPLIES)
PADDING UNDERCAST 2  STERILE (CAST SUPPLIES) ×2 IMPLANT
PUTTY DBM STAGRAFT 5CC (Putty) ×1 IMPLANT
SCREW ACUTRAK 2 MINI 28MM (Screw) ×1 IMPLANT
SCREW ACUTRAK 2 MINI 30MM (Screw) ×1 IMPLANT
SHEET MEDIUM DRAPE 40X70 STRL (DRAPES) ×2 IMPLANT
SPLINT PLASTER CAST XFAST 4X15 (CAST SUPPLIES) IMPLANT
SPLINT PLASTER XTRA FAST SET 4 (CAST SUPPLIES) ×15
SPONGE GAUZE 4X4 12PLY (GAUZE/BANDAGES/DRESSINGS) ×2 IMPLANT
STOCKINETTE 4X48 STRL (DRAPES) ×2 IMPLANT
STRIP CLOSURE SKIN 1/2X4 (GAUZE/BANDAGES/DRESSINGS) IMPLANT
SUCTION FRAZIER TIP 10 FR DISP (SUCTIONS) IMPLANT
SUT ETHILON 4 0 PS 2 18 (SUTURE) IMPLANT
SUT ETHILON 5 0 PS 2 18 (SUTURE) IMPLANT
SUT MERSILENE 4 0 P 3 (SUTURE) IMPLANT
SUT VIC AB 4-0 P-3 18XBRD (SUTURE) IMPLANT
SUT VIC AB 4-0 P3 18 (SUTURE)
SUT VICRYL 4-0 PS2 18IN ABS (SUTURE) ×2 IMPLANT
SUT VICRYL RAPIDE 4/0 PS 2 (SUTURE) IMPLANT
SYR BULB 3OZ (MISCELLANEOUS) ×1 IMPLANT
SYRINGE 10CC LL (SYRINGE) IMPLANT
TOWEL OR 17X24 6PK STRL BLUE (TOWEL DISPOSABLE) ×2 IMPLANT
TUBE CONNECTING 20X1/4 (TUBING) IMPLANT
UNDERPAD 30X30 INCONTINENT (UNDERPADS AND DIAPERS) ×2 IMPLANT

## 2013-05-30 NOTE — Anesthesia Procedure Notes (Signed)
Procedure Name: LMA Insertion Date/Time: 05/30/2013 11:42 AM Performed by: [place] Pre-anesthesia Checklist: Patient identified, Emergency Drugs available, Suction available and Patient being monitored Patient Re-evaluated:Patient Re-evaluated prior to inductionOxygen Delivery Method: Circle System Utilized Preoxygenation: Pre-oxygenation with 100% oxygen Intubation Type: IV induction Ventilation: Mask ventilation without difficulty LMA: LMA inserted LMA Size: 4.0 Number of attempts: 1 Airway Equipment and Method: bite block Placement Confirmation: positive ETCO2 and breath sounds checked- equal and bilateral Tube secured with: Tape Dental Injury: Teeth and Oropharynx as per pre-operative assessment

## 2013-05-30 NOTE — Transfer of Care (Signed)
Immediate Anesthesia Transfer of Care Note  Patient: Connor Solomon  Procedure(s) Performed: Procedure(s): LEFT THUMB METACARPAL JOINT FUSION (Left)  Patient Location: PACU  Anesthesia Type:General  Level of Consciousness: awake, alert  and oriented  Airway & Oxygen Therapy: Patient Spontanous Breathing and Patient connected to face mask oxygen  Post-op Assessment: Report given to PACU RN, Post -op Vital signs reviewed and stable and Patient moving all extremities  Post vital signs: Reviewed and stable  Complications: No apparent anesthesia complications

## 2013-05-30 NOTE — Op Note (Signed)
See dictated note [id number]

## 2013-05-30 NOTE — Anesthesia Postprocedure Evaluation (Signed)
Anesthesia Post Note  Patient: Connor Solomon  Procedure(s) Performed: Procedure(s) (LRB): LEFT THUMB METACARPAL JOINT FUSION (Left)  Anesthesia type: general  Patient location: PACU  Post pain: Pain level controlled  Post assessment: Patient's Cardiovascular Status Stable  Last Vitals:  Filed Vitals:   05/30/13 1330  BP: 158/123  Pulse: 74  Temp:   Resp: 13    Post vital signs: Reviewed and stable  Level of consciousness: sedated  Complications: No apparent anesthesia complications

## 2013-05-30 NOTE — H&P (Signed)
Connor Solomon is an 52 y.o. male.   Chief Complaint: left thumb pain and weakness HPI: as above with MCPJ instability and DJD  Past Medical History  Diagnosis Date  . Syncope     resolved- was related to panic attacks  . Full dentures   . Hypertension     Pt states he doen not have HTN and has never been treated for HTN  . Arthritis     hands, elbows bilaterally  . Panic disorder     pt states has resolved  . Kidney stone on left side last stone 4-5 yrs ago    recurrent (3 episodes)    Past Surgical History  Procedure Laterality Date  . Wrist surgery      fx only  . Wisdom tooth extraction    . Open reduction internal fixation (orif) distal radial fracture Left 11/29/2012    Procedure: OPEN REDUCTION INTERNAL FIXATION (ORIF) DISTAL RADIAL FRACTURE;  Surgeon: Schuyler Amor, MD;  Location: [facility];  Service: Orthopedics;  Laterality: Left;  LEFT DISTAL RADIUS OSTEOTOMY WITH BONE GRAFT  . Tendon repair Left 02/07/2013    Procedure: LEFT EXTENSOR INDICIS PROPRIUS  TO EXTENSOR POLLICIS LONGUS TENDON TRANSFER;  Surgeon: Schuyler Amor, MD;  Location: [place];  Service: Orthopedics;  Laterality: Left;    Family History  Problem Relation Age of Onset  . Heart attack Father   . Diabetes Neg Hx   . Hyperlipidemia Neg Hx   . Hypertension Neg Hx    Social History:  reports that he has never smoked. He does not have any smokeless tobacco history on file. He reports that he does not drink alcohol or use illicit drugs.  Allergies:  Allergies  Allergen Reactions  . Hydrocodone Itching and Nausea And Vomiting    Medications Prior to Admission  Medication Sig Dispense Refill  . ibuprofen (ADVIL,MOTRIN) 200 MG tablet Take 400 mg by mouth 2 (two) times daily as needed for pain.      Marland Kitchen oxyCODONE-acetaminophen (PERCOCET/ROXICET) 5-325 MG per tablet Take 1 tablet by mouth once.        Results for orders placed during the hospital encounter of  05/30/13 (from the past 48 hour(s))  POCT HEMOGLOBIN-HEMACUE     Status: None   Collection Time    05/30/13  9:06 AM      Result Value Range   Hemoglobin 13.8  13.0 - 17.0 g/dL   No results found.  Review of Systems  All other systems reviewed and are negative.    Blood pressure 114/81, pulse 70, temperature 97.8 F (36.6 C), temperature source Oral, resp. rate 20, height $RemoveBe'5\' 8"'cROUqqjPh$  (1.727 m), weight 76.204 kg (168 lb), SpO2 100.00%. Physical Exam  Constitutional: He is oriented to person, place, and time. He appears well-developed and well-nourished.  HENT:  Head: Normocephalic and atraumatic.  Cardiovascular: Normal rate.   Respiratory: Effort normal.  Musculoskeletal:       Left hand: He exhibits bony tenderness and deformity.  Left thumb MCPJ DJD abd instability  Neurological: He is alert and oriented to person, place, and time.  Skin: Skin is warm.  Psychiatric: He has a normal mood and affect. His behavior is normal. Judgment and thought content normal.     Assessment/Plan As above  Plan MCPJ fusion with possible radius graft  Jaynie Hitch A 05/30/2013, 11:26 AM

## 2013-05-30 NOTE — Anesthesia Preprocedure Evaluation (Addendum)
Anesthesia Evaluation  Patient identified by MRN, date of birth, ID band Patient awake    Reviewed: Allergy & Precautions, NPO status   Airway Mallampati: I TM Distance: >3 FB Neck ROM: Full    Dental   Pulmonary          Cardiovascular hypertension, Pt. on medications     Neuro/Psych PSYCHIATRIC DISORDERS Anxiety    GI/Hepatic   Endo/Other    Renal/GU      Musculoskeletal   Abdominal   Peds  Hematology   Anesthesia Other Findings   Reproductive/Obstetrics                          Anesthesia Physical Anesthesia Plan  ASA: II  Anesthesia Plan: General   Post-op Pain Management:    Induction: Intravenous  Airway Management Planned: LMA  Additional Equipment:   Intra-op Plan:   Post-operative Plan: Extubation in OR  Informed Consent: I have reviewed the patients History and Physical, chart, labs and discussed the procedure including the risks, benefits and alternatives for the proposed anesthesia with the patient or authorized representative who has indicated his/her understanding and acceptance.     Plan Discussed with: CRNA and Surgeon  Anesthesia Plan Comments:         Anesthesia Quick Evaluation

## 2013-05-31 ENCOUNTER — Encounter (HOSPITAL_BASED_OUTPATIENT_CLINIC_OR_DEPARTMENT_OTHER): Payer: Self-pay | Admitting: Orthopedic Surgery

## 2013-05-31 ENCOUNTER — Ambulatory Visit: Payer: Self-pay | Admitting: Occupational Therapy

## 2013-05-31 NOTE — Op Note (Signed)
NAMEDJUAN, TALTON NO.:  [id number]  MEDICAL RECORD NO.:  [id number]  LOCATION:                               FACILITY:  [facility]  PHYSICIAN:  Sheral Apley. Lucius Wise, M.D.DATE OF BIRTH:  12-11-1960  DATE OF PROCEDURE:  05/30/2013 DATE OF DISCHARGE:  05/30/2013                              OPERATIVE REPORT   PREOPERATIVE DIAGNOSIS:  Left thumb metacarpophalangeal joint instability and pain.  POSTOPERATIVE DIAGNOSIS:  Left thumb metacarpophalangeal joint instability and pain.  PROCEDURE:  Left thumb metacarpophalangeal arthrodesis using 2 mini Acutrak screws, 28 and 30 mm and StaGraft DBM putty from [employer].  SURGEON:  Sheral Apley. Burney Gauze, MD  ASSISTANT:  None.  ANESTHESIA:  General.  TOURNIQUET TIME:  58 minutes.  COMPLICATIONS:  None.  DRAINS:  None.  DESCRIPTION OF PROCEDURE:  The patient was taken to the operating suite. After the induction of adequate general anesthesia, left upper extremity was prepped and draped in sterile fashion.  An Esmarch was used to exsanguinate the limb.  Tourniquet was then inflated to 250 mmHg.  At this point in time, incision was made over the dorsal aspect of the left thumb metacarpophalangeal joint and the ulnarly based flap was elevated. The extensor mechanism was carefully incised at the sagittal band area and drawn ulnarly.  The capsule was incised.  The metacarpophalangeal joint was shotgunned open and the cartilage at the metacarpal head in the base of the proximal phalanx was decorticated until cancellous bone was exposed.  Once this was done, the joint was placed in a neutral position with slight rotations of the pad of the thumb with pad of the index.  It was then fixed with 2 mini Acutrak screws from proximal to distal across the arthrodesis site under direct and fluoroscopic guidance, one 30, one 28 mm.  Intraoperative fluoroscopy revealed adequate reduction in AP, lateral, and oblique views.  The wound  was irrigated.  [employer] StaGraft DBM putty was placed in the dorsal aspect to fill any void.  It was then closed in layers of 2-0 undyed Vicryl for the capsule, 2-0 Vicryl for the extensor mechanism realignment at the sagittal band area and then 4-0 Vicryl Rapide on the skin.  Xeroform, 4x4s, and a radial gutter splint was applied.  The patient tolerated the procedure well and went to the recovery room in a stable fashion.     Sheral Apley Burney Gauze, M.D.   ______________________________ Sheral Apley. Burney Gauze, M.D.    MAW/MEDQ  D:  05/30/2013  T:  05/31/2013  Job:  [id number]

## 2013-06-06 ENCOUNTER — Ambulatory Visit: Payer: Self-pay | Admitting: Occupational Therapy

## 2013-06-07 ENCOUNTER — Ambulatory Visit: Payer: Self-pay | Admitting: Occupational Therapy

## 2013-06-12 ENCOUNTER — Ambulatory Visit: Payer: Worker's Compensation | Admitting: *Deleted

## 2013-06-14 ENCOUNTER — Ambulatory Visit: Payer: Self-pay | Attending: Orthopedic Surgery | Admitting: Occupational Therapy

## 2013-06-14 DIAGNOSIS — IMO0001 Reserved for inherently not codable concepts without codable children: Secondary | ICD-10-CM | POA: Insufficient documentation

## 2013-06-14 DIAGNOSIS — M25549 Pain in joints of unspecified hand: Secondary | ICD-10-CM | POA: Insufficient documentation

## 2013-10-24 ENCOUNTER — Emergency Department (HOSPITAL_COMMUNITY)
Admission: EM | Admit: 2013-10-24 | Discharge: 2013-10-24 | Disposition: A | Discharge status: Home / Self Care | Payer: Self-pay | Attending: Emergency Medicine | Admitting: Emergency Medicine

## 2013-10-24 ENCOUNTER — Encounter (HOSPITAL_COMMUNITY): Payer: Self-pay | Admitting: Emergency Medicine

## 2013-10-24 DIAGNOSIS — W010XXA Fall on same level from slipping, tripping and stumbling without subsequent striking against object, initial encounter: Secondary | ICD-10-CM | POA: Insufficient documentation

## 2013-10-24 DIAGNOSIS — Y9301 Activity, walking, marching and hiking: Secondary | ICD-10-CM | POA: Insufficient documentation

## 2013-10-24 DIAGNOSIS — M129 Arthropathy, unspecified: Secondary | ICD-10-CM | POA: Insufficient documentation

## 2013-10-24 DIAGNOSIS — N200 Calculus of kidney: Secondary | ICD-10-CM | POA: Insufficient documentation

## 2013-10-24 DIAGNOSIS — IMO0002 Reserved for concepts with insufficient information to code with codable children: Secondary | ICD-10-CM | POA: Insufficient documentation

## 2013-10-24 DIAGNOSIS — Z98811 Dental restoration status: Secondary | ICD-10-CM | POA: Insufficient documentation

## 2013-10-24 DIAGNOSIS — Y92009 Unspecified place in unspecified non-institutional (private) residence as the place of occurrence of the external cause: Secondary | ICD-10-CM | POA: Insufficient documentation

## 2013-10-24 DIAGNOSIS — F410 Panic disorder [episodic paroxysmal anxiety] without agoraphobia: Secondary | ICD-10-CM | POA: Insufficient documentation

## 2013-10-24 DIAGNOSIS — I10 Essential (primary) hypertension: Secondary | ICD-10-CM | POA: Insufficient documentation

## 2013-10-24 DIAGNOSIS — S46311A Strain of muscle, fascia and tendon of triceps, right arm, initial encounter: Secondary | ICD-10-CM

## 2013-10-24 DIAGNOSIS — R55 Syncope and collapse: Secondary | ICD-10-CM | POA: Insufficient documentation

## 2013-10-24 MED ORDER — METHOCARBAMOL 500 MG PO TABS: 500.0000 mg | ORAL_TABLET | Freq: Two times a day (BID) | ORAL | Status: DC

## 2013-10-24 MED ORDER — NAPROXEN 500 MG PO TABS: 500.0000 mg | ORAL_TABLET | Freq: Two times a day (BID) | ORAL | Status: DC

## 2013-10-24 MED ORDER — OXYCODONE-ACETAMINOPHEN 5-325 MG PO TABS: 1.0000 | ORAL_TABLET | ORAL | Status: DC | PRN

## 2013-10-24 MED ORDER — KETOROLAC TROMETHAMINE 60 MG/2ML IM SOLN
60.0000 mg | Freq: Once | INTRAMUSCULAR | Status: AC
  Administered 2013-10-24: 60 mg via INTRAMUSCULAR
  Filled 2013-10-24: qty 2

## 2013-10-24 NOTE — Discharge Instructions (Signed)
1. Medications: robaxin, naproxyn, percocet, usual home medications 2. Treatment: rest, drink plenty of fluids, gentle stretching as discussed, use ice, wear sling as needed for pain control 3. Follow Up: Please followup with your primary doctor for discussion of your diagnoses and further evaluation after today's visit; if you do not have a primary care doctor use the resource guide provided to find one;    Muscle Strain A muscle strain is an injury that occurs when a muscle is stretched beyond its normal length. Usually a small number of muscle fibers are torn when this happens. Muscle strain is rated in degrees. First-degree strains have the least amount of muscle fiber tearing and pain. Second-degree and third-degree strains have increasingly more tearing and pain.  Usually, recovery from muscle strain takes 1 2 weeks. Complete healing takes 5 6 weeks.  CAUSES  Muscle strain happens when a sudden, violent force placed on a muscle stretches it too far. This may occur with lifting, sports, or a fall.  RISK FACTORS Muscle strain is especially common in athletes.  SIGNS AND SYMPTOMS At the site of the muscle strain, there may be:  Pain.  Bruising.  Swelling.  Difficulty using the muscle due to pain or lack of normal function. DIAGNOSIS  Your health care provider will perform a physical exam and ask about your medical history. TREATMENT  Often, the best treatment for a muscle strain is resting, icing, and applying cold compresses to the injured area.  HOME CARE INSTRUCTIONS   Use the PRICE method of treatment to promote muscle healing during the first 2 3 days after your injury. The PRICE method involves:  Protecting the muscle from being injured again.  Restricting your activity and resting the injured body part.  Icing your injury. To do this, put ice in a plastic bag. Place a towel between your skin and the bag. Then, apply the ice and leave it on from 15 20 minutes each hour.  After the third day, switch to moist heat packs.  Apply compression to the injured area with a splint or elastic bandage. Be careful not to wrap it too tightly. This may interfere with blood circulation or increase swelling.  Elevate the injured body part above the level of your heart as often as you can.  Only take over-the-counter or prescription medicines for pain, discomfort, or fever as directed by your health care provider.  Warming up prior to exercise helps to prevent future muscle strains. SEEK MEDICAL CARE IF:   You have increasing pain or swelling in the injured area.  You have numbness, tingling, or a significant loss of strength in the injured area. MAKE SURE YOU:   Understand these instructions.  Will watch your condition.  Will get help right away if you are not doing well or get worse. Document Released: 09/27/2005 Document Revised: 07/18/2013 Document Reviewed: 04/26/2013 [facility] Patient Information 2014 [place], [place].

## 2013-10-24 NOTE — ED Provider Notes (Signed)
CSN: [id number]     Arrival date & time 10/24/13  1254 History  This chart was scribed for non-physician practitioner Dierdre Forth, PA-C working with Flint Melter, MD by Donne Anon, ED Scribe. This patient was seen in room TR09C/TR09C and the patient's care was started at 1623.     First MD Initiated Contact with Patient 10/24/13 1623     Chief Complaint  Patient presents with  . Arm Pain  . Fall    The history is provided by the patient. No language interpreter was used.   HPI Comments: Connor Solomon is a 53 y.o. male who presents to the Emergency Department complaining of a fall which occurred this morning. He states he slipped on ice and caught himself by using his right arm to grab a hold of the railing. Pt reports he did not fall all the way to the ground and did not hit his head. The sudden onset, gradually worsening, moderate pain originates in the front of his right armpit and radiates down the inner arm towards his elbow. The pain is worse with movement and improved with rest. Elevating his arm relieves the pain. He denies decreased any other pain. He tried Tylenol and Advil with little relief.   Past Medical History  Diagnosis Date  . Syncope     resolved- was related to panic attacks  . Full dentures   . Hypertension     Pt states he doen not have HTN and has never been treated for HTN  . Arthritis     hands, elbows bilaterally  . Panic disorder     pt states has resolved  . Kidney stone on left side last stone 4-5 yrs ago    recurrent (3 episodes)   Past Surgical History  Procedure Laterality Date  . Wrist surgery      fx only  . Wisdom tooth extraction    . Open reduction internal fixation (orif) distal radial fracture Left 11/29/2012    Procedure: OPEN REDUCTION INTERNAL FIXATION (ORIF) DISTAL RADIAL FRACTURE;  Surgeon: [name], MD;  Location: [facility];  Service: Orthopedics;  Laterality: Left;  LEFT DISTAL RADIUS OSTEOTOMY  WITH BONE GRAFT  . Tendon repair Left 02/07/2013    Procedure: LEFT EXTENSOR INDICIS PROPRIUS  TO EXTENSOR POLLICIS LONGUS TENDON TRANSFER;  Surgeon: [name], MD;  Location: [facility];  Service: Orthopedics;  Laterality: Left;  . Carpal metacarpal fusion with distal radial bone graft Left 05/30/2013    Procedure: LEFT THUMB METACARPAL JOINT FUSION;  Surgeon: [name], MD;  [facility];  Service: Orthopedics;  Laterality: Left;   Family History  Problem Relation Age of Onset  . Heart attack Father   . Diabetes Neg Hx   . Hyperlipidemia Neg Hx   . Hypertension Neg Hx    History  Substance Use Topics  . Smoking status: Never Smoker   . Smokeless tobacco: Not on file  . Alcohol Use: No    Review of Systems  Constitutional: Negative for fever, diaphoresis, appetite change, fatigue and unexpected weight change.  HENT: Negative for mouth sores.   Eyes: Negative for visual disturbance.  Respiratory: Negative for cough, chest tightness, shortness of breath and wheezing.   Cardiovascular: Negative for chest pain.  Gastrointestinal: Negative for nausea, vomiting, abdominal pain, diarrhea and constipation.  Endocrine: Negative for polydipsia, polyphagia and polyuria.  Genitourinary: Negative for dysuria, urgency, frequency and hematuria.  Musculoskeletal: Positive for  arthralgias and myalgias. Negative for back pain and neck stiffness.  Skin: Negative for rash.  Allergic/Immunologic: Negative for immunocompromised state.  Neurological: Negative for syncope, light-headedness and headaches.  Hematological: Does not bruise/bleed easily.  Psychiatric/Behavioral: Negative for sleep disturbance. The patient is not nervous/anxious.     Allergies  Hydrocodone  Home Medications   Current Outpatient Rx  Name  Route  Sig  Dispense  Refill  . ibuprofen (ADVIL,MOTRIN) 200 MG tablet   Oral   Take 400 mg by mouth 2 (two) times daily as  needed for pain.         . methocarbamol (ROBAXIN) 500 MG tablet   Oral   Take 1 tablet (500 mg total) by mouth 2 (two) times daily.   20 tablet   0   . naproxen (NAPROSYN) 500 MG tablet   Oral   Take 1 tablet (500 mg total) by mouth 2 (two) times daily with a meal.   30 tablet   0   . oxyCODONE-acetaminophen (PERCOCET/ROXICET) 5-325 MG per tablet   Oral   Take 1-2 tablets by mouth every 4 (four) hours as needed for severe pain.   15 tablet   0    BP 127/86  Pulse 90  Temp(Src) 97.6 F (36.4 C) (Oral)  Resp 24  Ht $R'5\' 8"'cu$  (1.727 m)  Wt 165 lb (74.844 kg)  BMI 25.09 kg/m2  SpO2 98%  Physical Exam  Nursing note and vitals reviewed. Constitutional: He appears well-developed and well-nourished. No distress.  HENT:  Head: Normocephalic and atraumatic.  Eyes: Conjunctivae are normal. Pupils are equal, round, and reactive to light.  Neck: Normal range of motion.  Cardiovascular: Normal rate, regular rhythm, normal heart sounds and intact distal pulses.   No murmur heard. Capillary refill less than 3 seconds.  Pulmonary/Chest: Effort normal and breath sounds normal. No respiratory distress. He has no wheezes. He has no rales.  Musculoskeletal: He exhibits tenderness. He exhibits no edema.       Right shoulder: He exhibits pain and decreased strength. He exhibits normal range of motion, no tenderness, no bony tenderness, no swelling, no effusion, no crepitus, no deformity, no laceration, no spasm and normal pulse.  Full ROM of right shoulder with pain. Full ROM of right fingers, wrist and elbow. No midline or paraspinal tenderness to cervical spine. No tenderness to trapezius. Negative empty can test. No deformity, ecchymosis or swelling  Lymphadenopathy:    He has no cervical adenopathy.  Neurological: He is alert. Coordination normal.  5/5 strength in lower portion of right upper extremity. 3/5 strength in shoulder due to pain. Sensation intact throughout right upper  extremity.   Skin: Skin is warm and dry. No rash noted. He is not diaphoretic. No erythema.  No tenting of the skin  Psychiatric: He has a normal mood and affect.    ED Course  Procedures (including critical care time) DIAGNOSTIC STUDIES: Oxygen Saturation is 98% on RA, normal by my interpretation.    COORDINATION OF CARE: 4:24 PM Discussed treatment plan which includes muscle relaxer, antiinflammatories, a sling, rest, gentle stretching and pain medication with pt at bedside and pt agreed to plan. Advised pt to follow up with PCP within a week.   Labs Review Labs Reviewed - No data to display Imaging Review No results found.  EKG Interpretation   None       MDM   1. Strain of right triceps      JAIMERE FEUTZ presents with right  axilla and posterior arm pain after fall this AM.  Hx and PE consistent with triceps strain.  Pt is without physical exam findings to suggest complete tear of the triceps.  Due to Hx and PE no x-ray indicated at this time. Pain managed in ED. Pt advised to follow up with PCP if symptoms persist for further evaluation as needed. Patient given sling while in ED, conservative therapy recommended and discussed. Patient will be dc home & is agreeable with above plan.  It has been determined that no acute conditions requiring further emergency intervention are present at this time. The patient/guardian have been advised of the diagnosis and plan. We have discussed signs and symptoms that warrant return to the ED, such as changes or worsening in symptoms.   Vital signs are stable at discharge.   BP 127/86  Pulse 90  Temp(Src) 97.6 F (36.4 C) (Oral)  Resp 24  Ht $R'5\' 8"'Kr$  (1.727 m)  Wt 165 lb (74.844 kg)  BMI 25.09 kg/m2  SpO2 98%  Patient/guardian has voiced understanding and agreed to follow-up with the PCP or specialist.       Abigail Butts, PA-C 10/24/13 1654

## 2013-10-24 NOTE — ED Notes (Signed)
Pt reports slipped on ice and fell this am, pain to right arm. No obvious deformity.

## 2013-10-25 NOTE — ED Provider Notes (Signed)
Medical screening examination/treatment/procedure(s) were performed by non-physician practitioner and as supervising physician I was immediately available for consultation/collaboration.  Richarda Blade, MD 10/25/13 [phone]

## 2013-11-19 ENCOUNTER — Emergency Department (HOSPITAL_COMMUNITY)
Admission: EM | Admit: 2013-11-19 | Discharge: 2013-11-19 | Disposition: A | Discharge status: Home / Self Care | Payer: Self-pay | Attending: Emergency Medicine | Admitting: Emergency Medicine

## 2013-11-19 ENCOUNTER — Emergency Department (HOSPITAL_COMMUNITY): Payer: Self-pay

## 2013-11-19 ENCOUNTER — Encounter (HOSPITAL_COMMUNITY): Payer: Self-pay | Admitting: Emergency Medicine

## 2013-11-19 DIAGNOSIS — Z87442 Personal history of urinary calculi: Secondary | ICD-10-CM | POA: Insufficient documentation

## 2013-11-19 DIAGNOSIS — I10 Essential (primary) hypertension: Secondary | ICD-10-CM | POA: Insufficient documentation

## 2013-11-19 DIAGNOSIS — Z791 Long term (current) use of non-steroidal anti-inflammatories (NSAID): Secondary | ICD-10-CM | POA: Insufficient documentation

## 2013-11-19 DIAGNOSIS — Z9889 Other specified postprocedural states: Secondary | ICD-10-CM | POA: Insufficient documentation

## 2013-11-19 DIAGNOSIS — Z98811 Dental restoration status: Secondary | ICD-10-CM | POA: Insufficient documentation

## 2013-11-19 DIAGNOSIS — Z79899 Other long term (current) drug therapy: Secondary | ICD-10-CM | POA: Insufficient documentation

## 2013-11-19 DIAGNOSIS — Y9389 Activity, other specified: Secondary | ICD-10-CM | POA: Insufficient documentation

## 2013-11-19 DIAGNOSIS — Z8659 Personal history of other mental and behavioral disorders: Secondary | ICD-10-CM | POA: Insufficient documentation

## 2013-11-19 DIAGNOSIS — R5383 Other fatigue: Secondary | ICD-10-CM | POA: Insufficient documentation

## 2013-11-19 DIAGNOSIS — W010XXA Fall on same level from slipping, tripping and stumbling without subsequent striking against object, initial encounter: Secondary | ICD-10-CM | POA: Insufficient documentation

## 2013-11-19 DIAGNOSIS — S6990XA Unspecified injury of unspecified wrist, hand and finger(s), initial encounter: Secondary | ICD-10-CM | POA: Insufficient documentation

## 2013-11-19 DIAGNOSIS — Y929 Unspecified place or not applicable: Secondary | ICD-10-CM | POA: Insufficient documentation

## 2013-11-19 DIAGNOSIS — S6980XA Other specified injuries of unspecified wrist, hand and finger(s), initial encounter: Secondary | ICD-10-CM | POA: Insufficient documentation

## 2013-11-19 DIAGNOSIS — M19039 Primary osteoarthritis, unspecified wrist: Secondary | ICD-10-CM | POA: Insufficient documentation

## 2013-11-19 DIAGNOSIS — R5381 Other malaise: Secondary | ICD-10-CM | POA: Insufficient documentation

## 2013-11-19 DIAGNOSIS — S6992XA Unspecified injury of left wrist, hand and finger(s), initial encounter: Secondary | ICD-10-CM

## 2013-11-19 DIAGNOSIS — M19049 Primary osteoarthritis, unspecified hand: Secondary | ICD-10-CM | POA: Insufficient documentation

## 2013-11-19 MED ORDER — OXYCODONE-ACETAMINOPHEN 5-325 MG PO TABS: 1.0000 | ORAL_TABLET | ORAL | Status: DC | PRN

## 2013-11-19 MED ORDER — IBUPROFEN 800 MG PO TABS
800.0000 mg | ORAL_TABLET | Freq: Three times a day (TID) | ORAL | Status: DC
Start: 2013-11-19 — End: 2014-01-03

## 2013-11-19 NOTE — Discharge Instructions (Signed)
Continue to wear splint until evaluated by orthopedics Follow RICE method - see below  Take Ibuprofen for mild-moderate pain  Take Percocet for severe pain - Please be careful with this medication.  It can cause drowsiness.  Use caution while driving, operating machinery, drinking alcohol, or any other activities that may impair your physical or mental abilities.   Return to the emergency department if you develop any changing/worsening condition or any other concerns (please read additional information regarding your condition below)     Musculoskeletal Pain Musculoskeletal pain is muscle and boney aches and pains. These pains can occur in any part of the body. Your caregiver may treat you without knowing the cause of the pain. They may treat you if blood or urine tests, X-rays, and other tests were normal.  CAUSES There is often not a definite cause or reason for these pains. These pains may be caused by a type of germ (virus). The discomfort may also come from overuse. Overuse includes working out too hard when your body is not fit. Boney aches also come from weather changes. Bone is sensitive to atmospheric pressure changes. HOME CARE INSTRUCTIONS   Ask when your test results will be ready. Make sure you get your test results.  Only take over-the-counter or prescription medicines for pain, discomfort, or fever as directed by your caregiver. If you were given medications for your condition, do not drive, operate machinery or power tools, or sign legal documents for 24 hours. Do not drink alcohol. Do not take sleeping pills or other medications that may interfere with treatment.  Continue all activities unless the activities cause more pain. When the pain lessens, slowly resume normal activities. Gradually increase the intensity and duration of the activities or exercise.  During periods of severe pain, bed rest may be helpful. Lay or sit in any position that is comfortable.  Putting ice on  the injured area.  Put ice in a bag.  Place a towel between your skin and the bag.  Leave the ice on for 15 to 20 minutes, 3 to 4 times a day.  Follow up with your caregiver for continued problems and no reason can be found for the pain. If the pain becomes worse or does not go away, it may be necessary to repeat tests or do additional testing. Your caregiver may need to look further for a possible cause. SEEK IMMEDIATE MEDICAL CARE IF:  You have pain that is getting worse and is not relieved by medications.  You develop chest pain that is associated with shortness or breath, sweating, feeling sick to your stomach (nauseous), or throw up (vomit).  Your pain becomes localized to the abdomen.  You develop any new symptoms that seem different or that concern you. MAKE SURE YOU:   Understand these instructions.  Will watch your condition.  Will get help right away if you are not doing well or get worse. Document Released: 09/27/2005 Document Revised: 12/20/2011 Document Reviewed: 06/01/2013 [facility] Patient Information 2014 [place].  RICE: Routine Care for Injuries The routine care of many injuries includes Rest, Ice, Compression, and Elevation (RICE). HOME CARE INSTRUCTIONS  Rest is needed to allow your body to heal. Routine activities can usually be resumed when comfortable. Injured tendons and bones can take up to 6 weeks to heal. Tendons are the cord-like structures that attach muscle to bone.  Ice following an injury helps keep the swelling down and reduces pain.  Put ice in a plastic bag.  Place  a towel between your skin and the bag.  Leave the ice on for 15-20 minutes, 03-04 times a day. Do this while awake, for the first 24 to 48 hours. After that, continue as directed by your caregiver.  Compression helps keep swelling down. It also gives support and helps with discomfort. If an elastic bandage has been applied, it should be removed and reapplied every 3 to 4  hours. It should not be applied tightly, but firmly enough to keep swelling down. Watch fingers or toes for swelling, bluish discoloration, coldness, numbness, or excessive pain. If any of these problems occur, remove the bandage and reapply loosely. Contact your caregiver if these problems continue.  Elevation helps reduce swelling and decreases pain. With extremities, such as the arms, hands, legs, and feet, the injured area should be placed near or above the level of the heart, if possible. SEEK IMMEDIATE MEDICAL CARE IF:  You have persistent pain and swelling.  You develop redness, numbness, or unexpected weakness.  Your symptoms are getting worse rather than improving after several days. These symptoms may indicate that further evaluation or further X-rays are needed. Sometimes, X-rays may not show a small broken bone (fracture) until 1 week or 10 days later. Make a follow-up appointment with your caregiver. Ask when your X-ray results will be ready. Make sure you get your X-ray results. Document Released: 01/09/2001 Document Revised: 12/20/2011 Document Reviewed: 02/26/2011 [facility] Patient Information 2014 [place], [place].   Emergency Department Resource Guide 1) Find a Doctor and Pay Out of Pocket Although you won't have to find out who is covered by your insurance plan, it is a good idea to ask around and get recommendations. You will then need to call the office and see if the doctor you have chosen will accept you as a new patient and what types of options they offer for patients who are self-pay. Some doctors offer discounts or will set up payment plans for their patients who do not have insurance, but you will need to ask so you aren't surprised when you get to your appointment.  2) Contact Your Local Health Department Not all health departments have doctors that can see patients for sick visits, but many do, so it is worth a call to see if yours does. If you don't know where your  local health department is, you can check in your phone book. The CDC also has a tool to help you locate your state's health department, and many state websites also have listings of all of their local health departments.  3) Find a [facility] If your illness is not likely to be very severe or complicated, you may want to try a walk in clinic. These are popping up all over the country in pharmacies, drugstores, and shopping centers. They're usually staffed by nurse practitioners or physician assistants that have been trained to treat common illnesses and complaints. They're usually fairly quick and inexpensive. However, if you have serious medical issues or chronic medical problems, these are probably not your best option.  No Primary Care Doctor: - Call Health Connect at  [phone] - they can help you locate a primary care doctor that  accepts your insurance, provides certain services, etc. - Physician Referral Service- [phone]  Chronic Pain Problems: Organization         Address  Phone   Notes  [facility]  [phone] Patients need to be referred by their primary care doctor.   Medication Assistance:  Organization         Address  Phone   Notes  [facility] Medication Assistance Program [address] [place], [place] [address] [phone] --Must be a resident of [facility] -- Must have NO insurance coverage whatsoever (no Medicaid/ Medicare, etc.) -- The pt. MUST have a primary care doctor that directs their care regularly and follows them in the community   MedAssist  [phone]   [employer]  [phone]    Agencies that provide inexpensive medical care: Organization         Address  Phone   Notes  [place]  [phone]   [facility]    [phone]   [facility] [place], [place] [address] [phone]   [place]  [address], [place] [phone]   Planned Parenthood    [phone]   [facility]    [phone]   [place] and [place] [address], [place] Phone:  [phone], Fax:  [phone] Hours of Operation:  9 am - 6 pm, M-F.  Also accepts Medicaid/Medicare and self-pay.  [facility] for [place] [address], [address], [place] Phone: [phone], Fax: [phone]. Hours of Operation:  8:30 am - 5:30 pm, M-F.  Also accepts Medicaid and self-pay.  [facility] [address], [place] Phone: [phone]   [place], [place], [place] [phone], Ext. [phone] Mondays & Thursdays: 7-9 AM.  First 15 patients are seen on a first come, first serve basis.    [place] Providers:  Organization         Address  Phone   Notes  [facility] [address], [place] [phone] Also accepts self-pay patients.  [facility] [address], [place]  [phone]   [place], [address], [place] [phone]   [facility] [address] [place] [phone]   Lucianne Lei [address], [place]   [phone] Only accepts [place] Access [place] patients after they have their name applied to their card.   Self-Pay (no insurance) in [facility]:  Organization         Address  Phone   Notes  Sickle Cell Patients, [facility] [place] [phone]   [facility] [place] [phone]   [facility] [place]  [place], [place], [place] [phone]   [facility]/Dr. Osei-Bonsu  [address] [place] or [address], [place] [phone] Phone number for both [place] and [place] locations is the same.  [facility] [address] [place] [phone]   [facility] [address] [place] or [address] [phone]   [facility] [address] [place] [phone], phone; [phone], fax Sees patients 1st and 3rd Saturday of every month.  Must not qualify for public or private insurance (i.e. Medicaid, Medicare, [place] Health Choice, Veterans' Benefits)  Household income should be no more than 200% of the poverty level The clinic cannot  treat you if you are pregnant or think you are pregnant  Sexually transmitted diseases are not treated at the clinic.    Dental Care: Organization         Address  Phone  Notes  [facility] [place] [phone] Accepts children up to age 79 who are enrolled in [place] or [place]; pregnant women with a Medicaid card; and children who have applied for Medicaid or [place] Health Choice, but were declined, whose parents can pay a reduced fee at time of service.  [facility]  [address], [place] [phone] Accepts children up to age 60 who are enrolled in [place] or [place]; pregnant women with a Medicaid card; and children who have applied for Medicaid or [place] Health Choice, but were declined, whose parents can pay a reduced fee at time of service.  [facility] PRO[facility]  [place] [phone] Patients are seen by appointment only. Walk-ins are not accepted. [place] will see patients 38 years of age and older. Monday - Tuesday (8am-5pm) Most Wednesdays (8:30-5pm) $30 per visit, cash only  [facility] Adult [facility] Access [facility]  [address], [facility] [phone] Patients are seen by appointment only. Walk-ins are not  accepted. [place] will see patients 72 years of age and older. One Wednesday Evening (Monthly: Volunteer Based).  $30 per visit, cash only  [place]  [phone] for adults; Children under age 35, call Graduate Pediatric Dentistry at [phone]. Children aged 19-14, please call [phone] to request a pediatric application.  Dental services are provided in all areas of dental care including fillings, crowns and bridges, complete and partial dentures, implants, gum treatment, root canals, and extractions. Preventive care is also provided. Treatment is provided to both adults and children. Patients are selected via a lottery and there is often a waiting list.   [facility] [address], [place]  [phone] [email or web address]   [facility] [address] [place], [place] [phone], Ext. [phone] Second and Fourth Thursday of each month, opens at 6:30 AM; Clinic ends at 9 AM.  Patients are seen on a first-come first-served basis, and a limited number are seen during each clinic.   [facility]  [address] Hillard Danker [place], [place] [phone]   Eligibility Requirements You must have lived in [place], [place], or [place] counties for at least the last three months.   You cannot be eligible for state or federal sponsored [employer], including [name], [place], or [employer].   You generally cannot be eligible for healthcare insurance through your employer.    How to apply: Eligibility screenings are held every Tuesday and Wednesday afternoon from 1:00 pm until 4:00 pm. You do not need an appointment for the interview!  [facility] [address], [place], [place]   [place]  [place] Department  [place]  [phone]    Behavioral Health Resources in the  Community: Intensive Outpatient Programs Organization         Address  Phone  Notes  [place]. [address], [place], [place]   [facility] Outpatient [address] [place], [place]  ADS: Alcohol & Drug Svcs [address], [place], [place]   [place] [address]  [place], [place] or [phone]   Substance Abuse Resources Organization         Address  Phone  Notes  Alcohol and Drug Services  [phone]   [place]  [phone]   [facility] [place]   Chinita Pester  [phone]   Residential & Outpatient Substance Abuse Program  [phone]   Psychological Services Organization         Address  Phone  Notes  [facility] [place]  [place]  [phone]   [place] [address], [place] or [phone]    Mobile Crisis Teams Organization         Address  Phone  Notes  [facility], Mobile Crisis Care Unit  [phone]   [facility]otherapeutic [facility]  [address] [place], [place]   [place] [address], [place] [phone]    Self-Help/Support Groups Organization         Address  Phone             Notes  [place]. of [place] - variety of support groups  [place] Call for more information  Narcotics Anonymous (NA), [facility] [address], [employer] [place]  2 meetings at this location   Special educational needs teacher         Address  Phone  Notes  [facility] Residential Treatment [place],    [place]  [phone]   [facility]  [address], [place] [phone], [place], [place]   [place], [place] [phone] Admissions: 8am-3pm M-F  Incentives Substance [place] [phone],    [place], [place] [phone]   [facility] [address] [place], [place], [place]   [facility] [address]  [place], [place]   [facility]ight Programs - Intensive Outpatient [address], [place], [place]   [facility] [place].,  [place], [place] [phone] or [phone]   [facility]idential Treatment Services (RTS) [address], [place], [place] Accepts Medicaid  [facility] [place] [address],  [place] [phone] Substance Abuse/Addiction Treatment   [facility] Organization         Address  Phone  Notes  [facility]  [phone]   Domenic Schwab, PhD [address] [place], [place]   [phone] or [phone]   [place], [place] [phone]   [facility] [address] [place], [place] [phone] Insurance/Medicaid/sponsorship through [facility] and Families [address] [place]                                    [place], [place] [phone] [place] [address], [place] [phone]    Dr. Adele Schilder  [phone]   [facility] Dept. 1) [address], [place] 2) [place] 3)  [place] [address], [place] [phone]  [phone]  [place] [phone] or [phone] (After Hours)

## 2013-11-19 NOTE — ED Notes (Signed)
Hurt left thumb yesterday after playing with grand kids has hx of surgery on that finger

## 2013-11-19 NOTE — ED Provider Notes (Signed)
CSN: [id number]     Arrival date & time 11/19/13  1214 History  This chart was scribed for non-physician practitioner, Vernie Murders, PA-C working with Mercie Eon. Karle Starch, MD by Frederich Balding, ED scribe. This patient was seen in room TR09C/TR09C and the patient's care was started at 1:47 PM.    Chief Complaint  Patient presents with  . Finger Injury   The history is provided by the patient. No language interpreter was used.   HPI Comments: Connor Solomon is a 53 y.o. male who presents to the Emergency Department complaining of left thumb injury that started yesterday. He was playing with his grandchildren, tripped and landed on his thumb. Pt has sudden onset left thumb pain that is worsened by movement. He states he has a history of surgery on that finger (carpal metacarpal fusion) with Dr. Burney Gauze (05/2013). Denies other injury. Has limited ROM at baseline with no acute changes. No numbness/tingling or loss of sensation. Pt has taken ibuprofen with no relief.    Past Medical History  Diagnosis Date  . Syncope     resolved- was related to panic attacks  . Full dentures   . Hypertension     Pt states he doen not have HTN and has never been treated for HTN  . Arthritis     hands, elbows bilaterally  . Panic disorder     pt states has resolved  . Kidney stone on left side last stone 4-5 yrs ago    recurrent (3 episodes)   Past Surgical History  Procedure Laterality Date  . Wrist surgery      fx only  . Wisdom tooth extraction    . Open reduction internal fixation (orif) distal radial fracture Left 11/29/2012    Procedure: OPEN REDUCTION INTERNAL FIXATION (ORIF) DISTAL RADIAL FRACTURE;  Surgeon: Schuyler Amor, MD;  Location: [place];  Service: Orthopedics;  Laterality: Left;  LEFT DISTAL RADIUS OSTEOTOMY WITH BONE GRAFT  . Tendon repair Left 02/07/2013    Procedure: LEFT EXTENSOR INDICIS PROPRIUS  TO EXTENSOR POLLICIS LONGUS TENDON TRANSFER;  Surgeon: Schuyler Amor, MD;  Location: [place];  Service: Orthopedics;  Laterality: Left;  . Carpal metacarpal fusion with distal radial bone graft Left 05/30/2013    Procedure: LEFT THUMB METACARPAL JOINT FUSION;  Surgeon: Schuyler Amor, MD;  Location: [place];  Service: Orthopedics;  Laterality: Left;   Family History  Problem Relation Age of Onset  . Heart attack Father   . Diabetes Neg Hx   . Hyperlipidemia Neg Hx   . Hypertension Neg Hx    History  Substance Use Topics  . Smoking status: Never Smoker   . Smokeless tobacco: Not on file  . Alcohol Use: No    Review of Systems  Constitutional: Negative for fever, chills, diaphoresis, activity change, appetite change and fatigue.  Gastrointestinal: Negative for nausea, vomiting and abdominal pain.  Musculoskeletal: Positive for arthralgias. Negative for joint swelling and myalgias.  Skin: Negative for color change and wound.  Neurological: Positive for weakness (at baseline). Negative for numbness.  All other systems reviewed and are negative.   Allergies  Hydrocodone  Home Medications   Current Outpatient Rx  Name  Route  Sig  Dispense  Refill  . ibuprofen (ADVIL,MOTRIN) 200 MG tablet   Oral   Take 400 mg by mouth 2 (two) times daily as needed for pain.         . methocarbamol (ROBAXIN)  500 MG tablet   Oral   Take 1 tablet (500 mg total) by mouth 2 (two) times daily.   20 tablet   0   . naproxen (NAPROSYN) 500 MG tablet   Oral   Take 1 tablet (500 mg total) by mouth 2 (two) times daily with a meal.   30 tablet   0   . oxyCODONE-acetaminophen (PERCOCET/ROXICET) 5-325 MG per tablet   Oral   Take 1-2 tablets by mouth every 4 (four) hours as needed for severe pain.   15 tablet   0    BP 138/82  Pulse 100  Temp(Src) 98 F (36.7 C) (Oral)  Resp 18  SpO2 99%  Filed Vitals:   11/19/13 1224 11/19/13 1508  BP: 138/82 155/92  Pulse: 100 84  Temp: 98 F (36.7 C)   TempSrc: Oral    Resp: 18 18  SpO2: 99% 98%    Physical Exam  Nursing note and vitals reviewed. Constitutional: He is oriented to person, place, and time. He appears well-developed and well-nourished. No distress.  HENT:  Head: Normocephalic and atraumatic.  Eyes: EOM are normal.  Neck: Neck supple. No tracheal deviation present.  Cardiovascular: Normal rate, regular rhythm and normal heart sounds.   No murmur heard. Radial pulses present and equal bilaterally. Capillary refill <2 seconds in digits of left hand  Pulmonary/Chest: Effort normal and breath sounds normal. No respiratory distress. He has no wheezes.  Musculoskeletal: Normal range of motion. He exhibits tenderness. He exhibits no edema.       Hands: Tenderness to palpation to the left thumb throughout. No snuffbox or left wrist tenderness. No left arm tenderness. ROM limited in the right thumb however patient able to flex and extend at MCP and IP joints of 1st left phalanx. Patient able to flex and extend digits of left hand without difficulty or limitations.   Neurological: He is alert and oriented to person, place, and time.  Skin: Skin is warm and dry. He is not diaphoretic.  No erythema, edema, lacerations to left UE   Psychiatric: He has a normal mood and affect. His behavior is normal.    ED Course  Procedures (including critical care time)  DIAGNOSTIC STUDIES: Oxygen Saturation is 99% on RA, normal by my interpretation.    COORDINATION OF CARE: 1:50 PM-Discussed treatment plan which includes xray with pt at bedside and pt agreed to plan.   Labs Review Labs Reviewed - No data to display Imaging Review Dg Hand Complete Left  11/19/2013   CLINICAL DATA:  Fall 1 day ago. Left thumb pain and swelling. History of left hand and wrist surgery.  EXAM: LEFT HAND - COMPLETE 3+ VIEW  COMPARISON:  01/01/2013  FINDINGS: No acute fracture. The joints are normally aligned. Has been an operative fusion at the first metacarpophalangeal joint.  Two screws cross the joint and there is mature bony fusion. This is new from the prior study.  An old distal radial fracture has been reduced with a fusion plate and screws. There is an old ununited ulnar styloid fracture.  There is an old fracture of the distal tuft of the fourth finger.  Soft tissues are unremarkable.  IMPRESSION: 1. No acute fracture or dislocation. 2. Old fractures of the distal radius ulnar styloid and fourth finger distal tuft. 3. Status post ORIF of the distal radial fracture. Status post operative fusion at the first metacarpophalangeal joint.   Electronically Signed   By: Lajean Manes M.D.   On:  11/19/2013 14:22    EKG Interpretation   None      DG Hand Complete Left (Final result)  Result time: 11/19/13 14:22:53    Final result by Rad Results In Interface (11/19/13 14:22:53)    Narrative:   CLINICAL DATA: Fall 1 day ago. Left thumb pain and swelling. History of left hand and wrist surgery.  EXAM: LEFT HAND - COMPLETE 3+ VIEW  COMPARISON: 01/01/2013  FINDINGS: No acute fracture. The joints are normally aligned. Has been an operative fusion at the first metacarpophalangeal joint. Two screws cross the joint and there is mature bony fusion. This is new from the prior study.  An old distal radial fracture has been reduced with a fusion plate and screws. There is an old ununited ulnar styloid fracture.  There is an old fracture of the distal tuft of the fourth finger.  Soft tissues are unremarkable.  IMPRESSION: 1. No acute fracture or dislocation. 2. Old fractures of the distal radius ulnar styloid and fourth finger distal tuft. 3. Status post ORIF of the distal radial fracture. Status post operative fusion at the first metacarpophalangeal joint.   Electronically Signed By: Lajean Manes M.D. On: 11/19/2013 14:22         MDM   SIMPSON PAULOS is a 53 y.o. male who presents to the Emergency Department complaining of left thumb injury that started  yesterday. Left thumb pain possibly due to a sprain vs strain vs contusion. X-ray negative for fx, dislocation, or other acute process. Patient neurovascularly intact. Patient placed back in thumb splint and instructed to follow-up with his orthopedic surgeon. Instructed to wear brace until otherwise directed by Dr. Burney Gauze. RICE method discussed. Give short course of medication for OP pain management (6 Percocet). Return precautions, discharge instructions, and follow-up was discussed with the patient before discharge.     Discharge Medication List as of 11/19/2013  3:01 PM    START taking these medications   Details  !! ibuprofen (ADVIL,MOTRIN) 800 MG tablet Take 1 tablet (800 mg total) by mouth 3 (three) times daily., Starting 11/19/2013, Until Discontinued, Print    oxyCODONE-acetaminophen (PERCOCET/ROXICET) 5-325 MG per tablet Take 1-2 tablets by mouth every 4 (four) hours as needed for severe pain., Starting 11/19/2013, Until Discontinued, Print     !! - Potential duplicate medications found. Please discuss with provider.      Final impressions: 1. Injury of left thumb      Denman George   I personally performed the services described in this documentation, which was scribed in my presence. The recorded information has been reviewed and is accurate.        Lucila Maine, PA-C 11/20/13 [phone]

## 2013-11-19 NOTE — ED Notes (Signed)
Patient states "I took advil, ibuprofen and aleve.  Nothing helps".   Patient states he fell when playing with grandkids.  He splinted his thumb with old splint he had used after surgery.   Patient is wearing splint at this time.   He advised that "is hardware in there.  I didn't call my surgeon cause I wanted to make sure there wasn't anything major wrong first."

## 2013-11-21 NOTE — ED Provider Notes (Signed)
Medical screening examination/treatment/procedure(s) were performed by non-physician practitioner and as supervising physician I was immediately available for consultation/collaboration.  EKG Interpretation   None         Shaiann Mcmanamon B. Karle Starch, MD 11/21/13 2336

## 2014-01-03 ENCOUNTER — Encounter (HOSPITAL_COMMUNITY): Payer: Self-pay | Admitting: Emergency Medicine

## 2014-01-03 ENCOUNTER — Emergency Department (HOSPITAL_COMMUNITY)
Admission: EM | Admit: 2014-01-03 | Discharge: 2014-01-03 | Disposition: A | Discharge status: Home / Self Care | Payer: Self-pay | Attending: Emergency Medicine | Admitting: Emergency Medicine

## 2014-01-03 ENCOUNTER — Emergency Department (HOSPITAL_COMMUNITY): Payer: Self-pay

## 2014-01-03 DIAGNOSIS — Y9289 Other specified places as the place of occurrence of the external cause: Secondary | ICD-10-CM | POA: Insufficient documentation

## 2014-01-03 DIAGNOSIS — Y9389 Activity, other specified: Secondary | ICD-10-CM | POA: Insufficient documentation

## 2014-01-03 DIAGNOSIS — M79642 Pain in left hand: Secondary | ICD-10-CM

## 2014-01-03 DIAGNOSIS — Z8659 Personal history of other mental and behavioral disorders: Secondary | ICD-10-CM | POA: Insufficient documentation

## 2014-01-03 DIAGNOSIS — Y990 Civilian activity done for income or pay: Secondary | ICD-10-CM | POA: Insufficient documentation

## 2014-01-03 DIAGNOSIS — W230XXA Caught, crushed, jammed, or pinched between moving objects, initial encounter: Secondary | ICD-10-CM | POA: Insufficient documentation

## 2014-01-03 DIAGNOSIS — M19039 Primary osteoarthritis, unspecified wrist: Secondary | ICD-10-CM | POA: Insufficient documentation

## 2014-01-03 DIAGNOSIS — M19049 Primary osteoarthritis, unspecified hand: Secondary | ICD-10-CM | POA: Insufficient documentation

## 2014-01-03 DIAGNOSIS — Z9889 Other specified postprocedural states: Secondary | ICD-10-CM | POA: Insufficient documentation

## 2014-01-03 DIAGNOSIS — Z87442 Personal history of urinary calculi: Secondary | ICD-10-CM | POA: Insufficient documentation

## 2014-01-03 DIAGNOSIS — S6990XA Unspecified injury of unspecified wrist, hand and finger(s), initial encounter: Secondary | ICD-10-CM | POA: Insufficient documentation

## 2014-01-03 DIAGNOSIS — I10 Essential (primary) hypertension: Secondary | ICD-10-CM | POA: Insufficient documentation

## 2014-01-03 DIAGNOSIS — S6980XA Other specified injuries of unspecified wrist, hand and finger(s), initial encounter: Secondary | ICD-10-CM | POA: Insufficient documentation

## 2014-01-03 MED ORDER — OXYCODONE-ACETAMINOPHEN 5-325 MG PO TABS: ORAL_TABLET | ORAL | Status: DC

## 2014-01-03 NOTE — Progress Notes (Signed)
Orthopedic Tech Progress Note Patient Details:  Connor Solomon 03/01/1961 [id number]  Ortho Devices Type of Ortho Device: Thumb velcro splint Ortho Device/Splint Interventions: Application   Cammer, Theodoro Parma 01/03/2014, 4:16 PM

## 2014-01-03 NOTE — ED Notes (Signed)
Reports hx of surgery to left thumb, re-injured his hand and thumb yesterday at work and now having pain again.

## 2014-01-03 NOTE — ED Provider Notes (Signed)
CSN: [id number]     Arrival date & time 01/03/14  1335 History   First MD Initiated Contact with Patient 01/03/14 1356    This chart was scribed for Noland Fordyce PA-C, a non-physician practitioner working with No att. providers found by Denice Bors, ED Scribe. This patient was seen in room TR06C/TR06C and the patient's care was started at 11:12 AM      Chief Complaint  Patient presents with  . Hand Injury     (Consider location/radiation/quality/duration/timing/severity/associated sxs/prior Treatment) The history is provided by the patient. No language interpreter was used.   HPI Comments: Connor Solomon is a 53 y.o. male who presents to the Emergency Department complaining of constant left thumb pain onset yesterday after slamming thumb while at work under a slab of granit. Describes pain as moderate in severity. States he is right handed. Reports associated baseline limited ROM of left thumb. Reports taking tylenol and Advil with no relief of symptoms. Reports pain is exacerbated by touch and movement. Denies associated numbness, left wrist pain, and fever. Reports PMHx of left thumb surgery with Dr. Burney Gauze.   Past Medical History  Diagnosis Date  . Syncope     resolved- was related to panic attacks  . Full dentures   . Hypertension     Pt states he doen not have HTN and has never been treated for HTN  . Arthritis     hands, elbows bilaterally  . Panic disorder     pt states has resolved  . Kidney stone on left side last stone 4-5 yrs ago    recurrent (3 episodes)   Past Surgical History  Procedure Laterality Date  . Wrist surgery      fx only  . Wisdom tooth extraction    . Open reduction internal fixation (orif) distal radial fracture Left 11/29/2012    Procedure: OPEN REDUCTION INTERNAL FIXATION (ORIF) DISTAL RADIAL FRACTURE;  Surgeon: Schuyler Amor, MD;  Location: [place];  Service: Orthopedics;  Laterality: Left;  LEFT DISTAL RADIUS OSTEOTOMY  WITH BONE GRAFT  . Tendon repair Left 02/07/2013    Procedure: LEFT EXTENSOR INDICIS PROPRIUS  TO EXTENSOR POLLICIS LONGUS TENDON TRANSFER;  Surgeon: Schuyler Amor, MD;  Location: [place];  Service: Orthopedics;  Laterality: Left;  . Carpal metacarpal fusion with distal radial bone graft Left 05/30/2013    Procedure: LEFT THUMB METACARPAL JOINT FUSION;  Surgeon: Schuyler Amor, MD;  Location: [place];  Service: Orthopedics;  Laterality: Left;   Family History  Problem Relation Age of Onset  . Heart attack Father   . Diabetes Neg Hx   . Hyperlipidemia Neg Hx   . Hypertension Neg Hx    History  Substance Use Topics  . Smoking status: Never Smoker   . Smokeless tobacco: Not on file  . Alcohol Use: No    Review of Systems  Constitutional: Negative for fever.  Musculoskeletal:       Left thumb pain    Skin: Positive for wound.  Neurological: Negative for numbness.  Psychiatric/Behavioral: Negative for confusion.      Allergies  Hydrocodone  Home Medications   Current Outpatient Rx  Name  Route  Sig  Dispense  Refill  . acetaminophen (TYLENOL) 500 MG tablet   Oral   Take 500 mg by mouth every 6 (six) hours as needed for mild pain.         Marland Kitchen ibuprofen (ADVIL,MOTRIN) 200 MG tablet  Oral   Take 400 mg by mouth 2 (two) times daily as needed for pain.         Marland Kitchen oxyCODONE-acetaminophen (PERCOCET/ROXICET) 5-325 MG per tablet      Take 1-2 pills every 4-6 hours as needed for pain.   10 tablet   0    BP 136/83  Pulse 72  Temp(Src) 98.2 F (36.8 C) (Oral)  Resp 18  Ht $R'5\' 8"'vK$  (1.727 m)  Wt 165 lb (74.844 kg)  BMI 25.09 kg/m2  SpO2 98% Physical Exam  Nursing note and vitals reviewed. Constitutional: He is oriented to person, place, and time. He appears well-developed and well-nourished.  HENT:  Head: Normocephalic and atraumatic.  Eyes: EOM are normal.  Neck: Normal range of motion.  Cardiovascular: Normal rate.    Pulmonary/Chest: Effort normal.  Musculoskeletal: Normal range of motion.  Well-healed surgical scar on the volar aspect of left wrist. Baseline limited ROM of left wrist and left thumb. Normal sensation and cap refill < 3 seconds. TTP of over anatomical left snuff box.    Neurological: He is alert and oriented to person, place, and time.  Skin: Skin is warm, dry and intact. No ecchymosis noted. No erythema.  Psychiatric: He has a normal mood and affect. His behavior is normal.    ED Course  Procedures  COORDINATION OF CARE:  Nursing notes reviewed. Vital signs reviewed. Initial pt interview and examination performed.   11:12 AM-Discussed work up plan with pt at bedside, which includes x-ray of left hand. Pt agrees with plan.   Treatment plan initiated:Medications - No data to display   Initial diagnostic testing ordered.    11:12 AM Nursing Notes Reviewed/ Care Coordinated Applicable Imaging Reviewed and  incorporated into ED treatment Discussed results and treatment plan with pt. Pt demonstrates understanding and agrees with plan.  Labs Review Labs Reviewed - No data to display Imaging Review Dg Hand Complete Left  01/03/2014   CLINICAL DATA:  Pain status post trauma  EXAM: LEFT HAND - COMPLETE 3+ VIEW  COMPARISON:  None.  FINDINGS: Patient is status post arthrodesis first metacarpal phalangeal joint hardware intact. Open reduction internal fixation distal radius hardware is intact. Chronic posttraumatic changes tuft distal phalanx fourth digit. Chronic ulnar styloid fracture nonunion. There is no evidence of acute osseous abnormalities. Mild peripheral hypertrophic spurring involving the proximal and distal interphalangeal joints.  IMPRESSION: Mild osteoarthritic changes. Stable changes within the first digit and distal radius. Chronic posttraumatic changes distal phalanx fourth digit and ulnar styloid. No acute osseous abnormalities.   Electronically Signed   By: Margaree Mackintosh  M.D.   On: 01/03/2014 14:54     EKG Interpretation None      MDM   Final diagnoses:  Left hand pain    Pt c/o left hand pain after having granit drop on it at work yesterday. Pt has hx of previous hand and wrist surgery on same hand by Dr. Burney Gauze and has chronic decreased ROM due to surgery.  On exam pt has decreased ROM, and increased tenderness between webspace of thumb and index finger. Skin in tact. No ecchymosis. Pt tender in anatomical snuff box.  Plain films: mild OA changes. Stable changes w/n 1st digit and distal radius.  Due to hx of previous hand surgeries and pain pt requesting more "sturdy" wrist splint. Pt also tender in anatomical snuff box, some concern for underlying occult fracture that may require additional imaging in 1-2 weeks. Thumb spica splint placed. Pt advised to  f/u with Dr. Burney Gauze. Pt verbalized understanding and agreement with tx plan.    I personally performed the services described in this documentation, which was scribed in my presence. The recorded information has been reviewed and is accurate.    Noland Fordyce, PA-C 01/04/14 1116

## 2014-01-03 NOTE — ED Notes (Signed)
Patient transported to X-ray 

## 2014-01-06 NOTE — ED Provider Notes (Signed)
Medical screening examination/treatment/procedure(s) were performed by non-physician practitioner and as supervising physician I was immediately available for consultation/collaboration.   EKG Interpretation None       Nat Christen, MD 01/06/14 1529

## 2014-01-30 ENCOUNTER — Emergency Department: Payer: Self-pay | Admitting: Internal Medicine

## 2014-02-12 ENCOUNTER — Emergency Department (HOSPITAL_COMMUNITY)
Admission: EM | Admit: 2014-02-12 | Discharge: 2014-02-12 | Disposition: A | Discharge status: Home / Self Care | Payer: Self-pay | Attending: Emergency Medicine | Admitting: Emergency Medicine

## 2014-02-12 ENCOUNTER — Encounter (HOSPITAL_COMMUNITY): Payer: Self-pay | Admitting: Emergency Medicine

## 2014-02-12 ENCOUNTER — Emergency Department (HOSPITAL_COMMUNITY): Payer: Self-pay

## 2014-02-12 ENCOUNTER — Encounter (HOSPITAL_COMMUNITY)
Admission: EM | Disposition: A | Discharge status: Home / Self Care | Payer: Self-pay | Source: Home / Self Care | Attending: Emergency Medicine

## 2014-02-12 DIAGNOSIS — T18108A Unspecified foreign body in esophagus causing other injury, initial encounter: Secondary | ICD-10-CM | POA: Insufficient documentation

## 2014-02-12 DIAGNOSIS — IMO0002 Reserved for concepts with insufficient information to code with codable children: Secondary | ICD-10-CM | POA: Insufficient documentation

## 2014-02-12 DIAGNOSIS — Z87442 Personal history of urinary calculi: Secondary | ICD-10-CM | POA: Insufficient documentation

## 2014-02-12 DIAGNOSIS — T18128A Food in esophagus causing other injury, initial encounter: Secondary | ICD-10-CM

## 2014-02-12 HISTORY — PX: FOREIGN BODY REMOVAL: SHX962

## 2014-02-12 HISTORY — PX: ESOPHAGOGASTRODUODENOSCOPY: SHX5428

## 2014-02-12 LAB — COMPREHENSIVE METABOLIC PANEL
ALT: 19 U/L (ref 0–53)
AST: 22 U/L (ref 0–37)
Albumin: 4.2 g/dL (ref 3.5–5.2)
Alkaline Phosphatase: 73 U/L (ref 39–117)
BUN: 12 mg/dL (ref 6–23)
CO2: 24 mEq/L (ref 19–32)
Calcium: 9.8 mg/dL (ref 8.4–10.5)
Chloride: 105 mEq/L (ref 96–112)
Creatinine, Ser: 0.93 mg/dL (ref 0.50–1.35)
GLUCOSE: 97 mg/dL (ref 70–99)
Potassium: 3.9 mEq/L (ref 3.7–5.3)
SODIUM: 148 meq/L — AB (ref 137–147)
TOTAL PROTEIN: 7.5 g/dL (ref 6.0–8.3)
Total Bilirubin: 0.8 mg/dL (ref 0.3–1.2)

## 2014-02-12 LAB — CBC WITH DIFFERENTIAL/PLATELET
BASOS ABS: 0 10*3/uL (ref 0.0–0.1)
Basophils Relative: 0 % (ref 0–1)
EOS ABS: 0.1 10*3/uL (ref 0.0–0.7)
Eosinophils Relative: 2 % (ref 0–5)
HEMATOCRIT: 45.1 % (ref 39.0–52.0)
Hemoglobin: 15.2 g/dL (ref 13.0–17.0)
Lymphocytes Relative: 18 % (ref 12–46)
Lymphs Abs: 1.4 10*3/uL (ref 0.7–4.0)
MCH: 28.9 pg (ref 26.0–34.0)
MCHC: 33.7 g/dL (ref 30.0–36.0)
MCV: 85.7 fL (ref 78.0–100.0)
MONO ABS: 0.5 10*3/uL (ref 0.1–1.0)
Monocytes Relative: 6 % (ref 3–12)
Neutro Abs: 6.2 10*3/uL (ref 1.7–7.7)
Neutrophils Relative %: 74 % (ref 43–77)
PLATELETS: 352 10*3/uL (ref 150–400)
RBC: 5.26 MIL/uL (ref 4.22–5.81)
RDW: 13.6 % (ref 11.5–15.5)
WBC: 8.2 10*3/uL (ref 4.0–10.5)

## 2014-02-12 LAB — LIPASE, BLOOD: Lipase: 21 U/L (ref 11–59)

## 2014-02-12 SURGERY — EGD (ESOPHAGOGASTRODUODENOSCOPY)
Anesthesia: Moderate Sedation

## 2014-02-12 MED ORDER — MORPHINE SULFATE 4 MG/ML IJ SOLN
4.0000 mg | Freq: Once | INTRAMUSCULAR | Status: AC
  Administered 2014-02-12: 4 mg via INTRAVENOUS
  Filled 2014-02-12: qty 1

## 2014-02-12 MED ORDER — PANTOPRAZOLE SODIUM 40 MG PO TBEC: 40.0000 mg | DELAYED_RELEASE_TABLET | Freq: Every day | ORAL | Status: DC

## 2014-02-12 MED ORDER — FENTANYL CITRATE 0.05 MG/ML IJ SOLN
INTRAMUSCULAR | Status: DC | PRN
  Administered 2014-02-12 (×4): 25 ug via INTRAVENOUS

## 2014-02-12 MED ORDER — MIDAZOLAM HCL 5 MG/ML IJ SOLN
INTRAMUSCULAR | Status: AC
  Filled 2014-02-12: qty 2

## 2014-02-12 MED ORDER — ONDANSETRON HCL 4 MG/2ML IJ SOLN
4.0000 mg | Freq: Once | INTRAMUSCULAR | Status: AC
  Administered 2014-02-12: 4 mg via INTRAVENOUS
  Filled 2014-02-12: qty 2

## 2014-02-12 MED ORDER — GLUCAGON HCL (RDNA) 1 MG IJ SOLR
1.0000 mg | Freq: Once | INTRAMUSCULAR | Status: AC
  Administered 2014-02-12: 1 mg via INTRAVENOUS
  Filled 2014-02-12: qty 1

## 2014-02-12 MED ORDER — DIPHENHYDRAMINE HCL 50 MG/ML IJ SOLN
INTRAMUSCULAR | Status: DC | PRN
Start: 2014-02-12 — End: 2014-02-12
  Administered 2014-02-12: 25 mg via INTRAVENOUS

## 2014-02-12 MED ORDER — TRAMADOL HCL 50 MG PO TABS: 50.0000 mg | ORAL_TABLET | Freq: Four times a day (QID) | ORAL | Status: DC | PRN

## 2014-02-12 MED ORDER — MIDAZOLAM HCL 10 MG/2ML IJ SOLN
INTRAMUSCULAR | Status: DC | PRN
  Administered 2014-02-12 (×5): 2 mg via INTRAVENOUS

## 2014-02-12 MED ORDER — SODIUM CHLORIDE 0.9 % IV BOLUS (SEPSIS)
1000.0000 mL | Freq: Once | INTRAVENOUS | Status: AC
  Administered 2014-02-12: 1000 mL via INTRAVENOUS

## 2014-02-12 MED ORDER — SODIUM CHLORIDE 0.9 % IV SOLN
INTRAVENOUS | Status: DC
  Administered 2014-02-12: 16:00:00 via INTRAVENOUS

## 2014-02-12 MED ORDER — FENTANYL CITRATE 0.05 MG/ML IJ SOLN
INTRAMUSCULAR | Status: AC
  Filled 2014-02-12: qty 2

## 2014-02-12 NOTE — ED Provider Notes (Signed)
Medical screening examination/treatment/procedure(s) were conducted as a shared visit with non-physician practitioner(s) and myself.  I personally evaluated the patient during the encounter.   EKG Interpretation None       Patient here with epigastric pain - ate meat last night - cannot tolerate his own secretions at this time. Unable to drink water here. Epigastric pain on exam. Clinical history c/w food impaction. No relief with glucagon. GI to take to Endoscopy suite.  Osvaldo Shipper, MD 02/12/14 2024

## 2014-02-12 NOTE — Discharge Instructions (Signed)

## 2014-02-12 NOTE — ED Notes (Signed)
Spoke with dawn in endoscopy and states that it will be another "hour or so". Pt. Updated on timeframe.

## 2014-02-12 NOTE — ED Notes (Signed)
Patient transported to X-ray 

## 2014-02-12 NOTE — ED Notes (Signed)
Pt reports he is in need of dentures and aspirated on cube steak on Sunday and has been vomiting since then. Pt reports he can not tolerate any PO intake.

## 2014-02-12 NOTE — ED Notes (Signed)
Pt. Transported to endoscopy.

## 2014-02-12 NOTE — ED Notes (Signed)
Pt reports he was eating dinner on Sunday, became choked while eating. Pt has been vomiting since. Pt reports abdominal pain.

## 2014-02-12 NOTE — Op Note (Signed)
[facility] [place], [id number]   ENDOSCOPY PROCEDURE REPORT  PATIENT: Connor Solomon, Connor Solomon  MR#: [id number] BIRTHDATE: 16-Jan-1961 , 52  yrs. old GENDER: Male ENDOSCOPIST: Jerene Bears, MD REFERRED BY:  Delos Haring, PA-C, Emergency room PROCEDURE DATE:  02/12/2014 PROCEDURE:  EGD w/ fb removal ASA CLASS:     Class II INDICATIONS:  Dysphagia.   Foreign body removal from esophagus. MEDICATIONS: These medications were titrated to patient response per physician's verbal order, Fentanyl 100 mcg IV, Versed 10 mg IV, and Diphenhydramine (Benadryl) 50 mg IV TOPICAL ANESTHETIC: Cetacaine Spray  DESCRIPTION OF PROCEDURE: After the risks benefits and alternatives of the procedure were thoroughly explained, informed consent was obtained.  The Pentax Gastroscope [id number] endoscope was introduced through the mouth and advanced to the second portion of the duodenum. Without limitations.  The instrument was slowly withdrawn as the mucosa was fully examined.     ESOPHAGUS:  A meat impaction was found in the distal esophagus.  The scope was able to be gently advanced around the meat impaction and into the stomach.  Once this was accomplished the meat impaction was gently advanced into the stomach with relative ease.   There was mild esophagitis in the distal esophagus at the GE junction likely from food impaction present for 48 hours.  There is very likely a Schatzki ring at the gastroesophageal junction.   No mass lesion was seen  STOMACH: The mucosa of the stomach appeared normal.  DUODENUM: The duodenal mucosa showed no abnormalities in the bulb and second portion of the duodenum.  Retroflexed views revealed a small hiatal hernia.     The scope was then withdrawn from the patient and the procedure completed.  COMPLICATIONS: There were no complications.  ENDOSCOPIC IMPRESSION: 1.   Meat impaction in the distal esophagus; advanced into  the stomach; distal esophagitis likely from pressure irritation from food impaction; Schatzki ring was found at the gastroesophageal junction 2.   The mucosa of the stomach appeared normal 3.   The duodenal mucosa showed no abnormalities in the bulb and second portion of the duodenum  RECOMMENDATIONS: 1.  Daily PPI (acid suppression) for 1 month 2.  Repeat EGD for probable dilation in 4-6 weeks 3.  Soft diet, chew food extremely well, take small bites  eSigned:  Jerene Bears, MD 02/12/2014 6:08 PM   CC:The Patient  PATIENT NAME:  Connor Solomon, Connor Solomon MR#: [id number]

## 2014-02-12 NOTE — Consult Note (Signed)
Patient seen, examined, and I agree with the above documentation, including the assessment and plan. Proceeding to EGD for probable food impaction The nature of the procedure, as well as the risks, benefits, and alternatives were carefully and thoroughly reviewed with the patient. Ample time for discussion and questions allowed. The patient understood, was satisfied, and agreed to proceed.

## 2014-02-12 NOTE — ED Provider Notes (Signed)
CSN: [id number]     Arrival date & time 02/12/14  0808 History   First MD Initiated Contact with Patient 02/12/14 1117     Chief Complaint  Patient presents with  . Abdominal Pain  . Emesis     (Consider location/radiation/quality/duration/timing/severity/associated sxs/prior Treatment) HPI  Patient to the ER with complaints of vomiting and epigastric pain. He no longer has teeth and is waiting for his dentures to come in, because of this their are foods he is not supposed to eat. On Sunday he decided to have some cube steak and choked on it, he through up right away but since then he has been having vomiting and pain. She reports being able to swallow his own secretions, but even a small cup of water or liquid comes right back up. He reports this has happened before but usually does not last more than a few hours. He denies having any bleeding or dysuria, hematuria. He had one normal bowel movement right after the incident but denies having any since then.   Past Medical History  Diagnosis Date  . Syncope     resolved- was related to panic attacks  . Full dentures   . Hypertension     Pt states he doen not have HTN and has never been treated for HTN  . Arthritis     hands, elbows bilaterally  . Panic disorder     pt states has resolved  . Kidney stone on left side last stone 4-5 yrs ago    recurrent (3 episodes)   Past Surgical History  Procedure Laterality Date  . Wrist surgery      fx only  . Wisdom tooth extraction    . Open reduction internal fixation (orif) distal radial fracture Left 11/29/2012    Procedure: OPEN REDUCTION INTERNAL FIXATION (ORIF) DISTAL RADIAL FRACTURE;  Surgeon: Schuyler Amor, MD;  Location: [facility];  Service: Orthopedics;  Laterality: Left;  LEFT DISTAL RADIUS OSTEOTOMY WITH BONE GRAFT  . Tendon repair Left 02/07/2013    Procedure: LEFT EXTENSOR INDICIS PROPRIUS  TO EXTENSOR POLLICIS LONGUS TENDON TRANSFER;  Surgeon: Schuyler Amor, MD;  Location: [facility];  Service: Orthopedics;  Laterality: Left;  . Carpal metacarpal fusion with distal radial bone graft Left 05/30/2013    Procedure: LEFT THUMB METACARPAL JOINT FUSION;  Surgeon: Schuyler Amor, MD;  Location: [place];  Service: Orthopedics;  Laterality: Left;   Family History  Problem Relation Age of Onset  . Heart attack Father   . Diabetes Neg Hx   . Hyperlipidemia Neg Hx   . Hypertension Neg Hx    History  Substance Use Topics  . Smoking status: Never Smoker   . Smokeless tobacco: Not on file  . Alcohol Use: No    Review of Systems   Review of Systems  Gen: no weight loss, fevers, chills, night sweats  Eyes: no discharge or drainage, no occular pain or visual changes  Nose: no epistaxis or rhinorrhea  Mouth: no dental pain, no sore throat  Neck: no neck pain  Lungs:No wheezing, coughing or hemoptysis CV: no chest pain, palpitations, dependent edema or orthopnea  Abd: + abdominal pain, nausea, vomiting, No diarrhea GU: no dysuria or gross hematuria  MSK:  No muscle weakness or pain Neuro: no headache, no focal neurologic deficits  Skin: no rash or wounds Psyche: no complaints    Allergies  Hydrocodone  Home Medications   Prior to Admission  medications   Medication Sig Start Date End Date Taking? Authorizing Provider  acetaminophen (TYLENOL) 500 MG tablet Take 500-1,000 mg by mouth every 6 (six) hours as needed for moderate pain.   Yes Historical Provider, MD   BP 148/81  Pulse 51  Temp(Src) 98.5 F (36.9 C) (Oral)  Resp 18  SpO2 100% Physical Exam  Nursing note and vitals reviewed. Constitutional: He appears well-developed and well-nourished. No distress.  HENT:  Head: Normocephalic and atraumatic.  Eyes: Pupils are equal, round, and reactive to light.  Neck: Normal range of motion. Neck supple.  Cardiovascular: Normal rate and regular rhythm.   Pulmonary/Chest: Effort normal.   Abdominal: Soft. There is tenderness in the epigastric area.  Pt dry heaving in exam room.   Neurological: He is alert.  Skin: Skin is warm and dry.      ED Course  Procedures (including critical care time) Labs Review Labs Reviewed  COMPREHENSIVE METABOLIC PANEL - Abnormal; Notable for the following:    Sodium 148 (*)    All other components within normal limits  LIPASE, BLOOD  CBC WITH DIFFERENTIAL    Imaging Review No results found.   EKG Interpretation None      MDM   Final diagnoses:  Food impaction of esophagus    Patient given IV Glucanate in the ED, then fluid challenged. I witnessed patient attempt to take a sip of water and then has been vomiting and heaving ever since. Dr. Mingo Amber has seen patient as well and recommends consulting gastroenterology, pt will most likely need to be scoped to push the bolus into his stomach.   I spoke with Dr. Prince Rome associate, GI,  who says that he will most likely go straight to Endo for an endoscopy. Pt given another dose of Zofran IV.   Linus Mako, PA-C 02/12/14 1337

## 2014-02-12 NOTE — Consult Note (Signed)
[facility] Consult: 4:32 PM 02/12/2014  LOS: 0 days    Referring Provider: Linus Mako, PA-C Primary Care Physician:  none Primary Gastroenterologist:  unassigned     Reason for Consultation:  Dysphagia, food (steak) impaction.   HPI: Connor Solomon is a 53 y.o. male.  Hx left wrist surgery x 3.  Hx kidney stones and lithotripsy.   All of remaining teeth removed about 18 months ago.  Has not been able to afford dentures.  About 5 episodes of food impaction to meat over the last year.  These have resolved after pt able to regurgitate the food on his own at home.   On Sunday at 4 Pm eating cube steak that got stuck.  Unable to get it to pass or come back up.  Unable to drink liquids or eat since then.  Has pain at level of GE Jx.   Getting dizzy today, presyncopal spell.  Last urinated at 11 PM last night.  Does not get heartburn or dysphagia to liquids generally.  Weight stable .  No ETOH and no cigarettes.     Past Medical History  Diagnosis Date  . Syncope     resolved- was related to panic attacks  . Full dentures   . Hypertension     Pt states he doen not have HTN and has never been treated for HTN  . Arthritis     hands, elbows bilaterally  . Panic disorder     pt states has resolved  . Kidney stone on left side last stone 4-5 yrs ago    recurrent (3 episodes)    Past Surgical History  Procedure Laterality Date  . Wrist surgery      fx only  . Wisdom tooth extraction    . Open reduction internal fixation (orif) distal radial fracture Left 11/29/2012    Procedure: OPEN REDUCTION INTERNAL FIXATION (ORIF) DISTAL RADIAL FRACTURE;  Surgeon: Schuyler Amor, MD;  Location: [facility];  Service: Orthopedics;  Laterality: Left;  LEFT DISTAL RADIUS OSTEOTOMY WITH BONE GRAFT  .  Tendon repair Left 02/07/2013    Procedure: LEFT EXTENSOR INDICIS PROPRIUS  TO EXTENSOR POLLICIS LONGUS TENDON TRANSFER;  Surgeon: Schuyler Amor, MD;  Location: [facility];  Service: Orthopedics;  Laterality: Left;  . Carpal metacarpal fusion with distal radial bone graft Left 05/30/2013    Procedure: LEFT THUMB METACARPAL JOINT FUSION;  Surgeon: Schuyler Amor, MD;  Location: [facility];  Service: Orthopedics;  Laterality: Left;    Prior to Admission medications   Medication Sig Start Date End Date Taking? Authorizing Provider  acetaminophen (TYLENOL) 500 MG tablet Take 500-1,000 mg by mouth every 6 (six) hours as needed for moderate pain.   Yes Historical Provider, MD    Scheduled Meds:  Infusions:  PRN Meds:    Allergies as of 02/12/2014 - Review Complete 02/12/2014  Allergen Reaction Noted  . Hydrocodone Itching and Nausea And Vomiting 09/22/2009    Family History  Problem Relation Age of Onset  .  Heart attack Father   . Diabetes Neg Hx   . Hyperlipidemia Neg Hx   . Hypertension Neg Hx     History   Social History  . Marital Status: Lives with Partner of 15 years carrie Azbill .  Cell [phone].  Home phone [phone]    Spouse Name: N/A    Number of Children: N/A  . Years of Education: N/A   Occupational History  . Geologist, engineering for [employer] History Main Topics  . Smoking status: Never Smoker   . Smokeless tobacco: Not on file  . Alcohol Use: None  . Drug Use: No  . Sexual Activity: Not on file    REVIEW OF SYSTEMS: Constitutional:  Stable weight, good endurance and strength  ENT:  No nose bleeds Pulm:  No SOB or cough CV:  No palpitations, no LE edema.  GU:  No hematuria, no frequency GI:  Per HPI Heme:  No anemia in past   Transfusions:  None ever Neuro:  No headaches, no peripheral tingling or numbness Derm:  No itching, no rash or sores.  Endocrine:  No sweats or chills.  No polyuria  or dysuria Immunization:  Not queried.  Travel:  None beyond local counties in last few months.    PHYSICAL EXAM: Vital signs in last 24 hours: Filed Vitals:   02/12/14 1338  BP: 142/89  Pulse: 51 - 103  Temp: 98.5  Resp: 18   Wt Readings from Last 3 Encounters:  02/12/14 74.844 kg (165 lb)  02/12/14 74.844 kg (165 lb)  01/03/14 74.844 kg (165 lb)   General: looks well Head:  No swelling no asymmetry  Eyes:  No icterus or pallor Ears:  Not HOH  Nose:  No discharge Mouth:  No teeth, clear and moist oral mm Neck:  No mass, no TMG.  No crepitus Lungs:  Not SOB, no cough Heart: RRR.  No MRG Abdomen:  Soft, NT, ND, no mass, no HSM.   Rectal: not done   Musc/Skeltl: no joint contractures or deformities Extremities:  No CCE  Neurologic:  Oriented x 3, fully alert, no limb weakness, no tremor Skin:  No rash, no sores. No telangectasia Tattoos:  none Nodes:  No cervical adenopathy   Psych:  Relaxed, pleasant, cooperative  Intake/Output from previous day:   Intake/Output this shift:    LAB RESULTS:  Recent Labs  02/12/14 1211  WBC 8.2  HGB 15.2  HCT 45.1  PLT 352   BMET Lab Results  Component Value Date   NA 148* 02/12/2014   NA 139 01/12/2010   NA 139 10/02/2009   K 3.9 02/12/2014   K 3.5 01/12/2010   K 3.6 10/02/2009   CL 105 02/12/2014   CL 103 01/12/2010   CL 99 10/02/2009   CO2 24 02/12/2014   CO2 21 01/12/2010   CO2 28 10/02/2009   GLUCOSE 97 02/12/2014   GLUCOSE 84 01/12/2010   GLUCOSE 90 10/02/2009   BUN 12 02/12/2014   BUN 12 01/12/2010   BUN 13 10/02/2009   CREATININE 0.93 02/12/2014   CREATININE 0.97 01/12/2010   CREATININE 1.15 10/02/2009   CALCIUM 9.8 02/12/2014   CALCIUM 9.9 01/12/2010   CALCIUM 10.0 10/02/2009   LFT  Recent Labs  02/12/14 1211  PROT 7.5  ALBUMIN 4.2  AST 22  ALT 19  ALKPHOS 73  BILITOT 0.8   PT/INR Lab Results  Component Value Date   INR 0.98 08/26/2009  Lipase     Component Value Date/Time   LIPASE 21 02/12/2014 1211      RADIOLOGY STUDIES: Dg Chest 2 View  02/12/2014   CLINICAL DATA:  Possible food impaction.  Food stuck in esophagus.  EXAM: CHEST  2 VIEW  COMPARISON:  09/29/2009  FINDINGS: The heart size and mediastinal contours are within normal limits. Both lungs are clear. The visualized skeletal structures are unremarkable. No pneumomediastinum or pneumothorax.  IMPRESSION: No active cardiopulmonary disease.   Electronically Signed   By: Rolm Baptise M.D.   On: 02/12/2014 15:29    ENDOSCOPIC STUDIES: None ever  IMPRESSION:   *  Food impaction.     PLAN:     *  EGD with removal of foreign body today.    Vena Rua  02/12/2014, 4:32 PM Pager: [phone]

## 2014-02-12 NOTE — ED Notes (Signed)
Pt not tolerating PO intake

## 2014-02-12 NOTE — ED Notes (Signed)
Consent signed for EGD

## 2014-02-13 ENCOUNTER — Encounter (HOSPITAL_COMMUNITY): Payer: Self-pay | Admitting: Internal Medicine

## 2014-02-14 ENCOUNTER — Telehealth: Payer: Self-pay

## 2014-02-14 NOTE — Telephone Encounter (Signed)
Patient needs to be set up for EGD with dil in the [facility] for 4-6 weeks

## 2014-02-14 NOTE — Telephone Encounter (Signed)
I called and the person that answered asked that I call back after 3 on the home number

## 2014-02-14 NOTE — Telephone Encounter (Signed)
Message copied by Marlon Pel on Thu Feb 14, 2014 11:15 AM ------      Message from: Jerene Bears      Created: Wed Feb 13, 2014  4:43 PM       Yes            ----- Message -----         From: Kellie Moor, RN         Sent: 02/13/2014   4:15 PM           To: Jerene Bears, MD            [facility] ok for repeat EGD/dil???       ------

## 2014-02-18 ENCOUNTER — Encounter: Payer: Self-pay | Admitting: Internal Medicine

## 2014-02-18 NOTE — Telephone Encounter (Signed)
Patient is scheduled for [facility] 03/11/14 for pre-visit and 03/19/14 3:00 for EGD dil

## 2014-03-19 ENCOUNTER — Encounter: Payer: Self-pay | Admitting: Internal Medicine

## 2014-03-20 ENCOUNTER — Emergency Department: Payer: Self-pay | Admitting: Emergency Medicine

## 2014-04-10 ENCOUNTER — Emergency Department (HOSPITAL_COMMUNITY)
Admission: EM | Admit: 2014-04-10 | Discharge: 2014-04-10 | Disposition: A | Discharge status: Home / Self Care | Payer: Self-pay | Attending: Emergency Medicine | Admitting: Emergency Medicine

## 2014-04-10 ENCOUNTER — Encounter (HOSPITAL_COMMUNITY): Payer: Self-pay | Admitting: Emergency Medicine

## 2014-04-10 ENCOUNTER — Emergency Department (HOSPITAL_COMMUNITY): Payer: Self-pay

## 2014-04-10 DIAGNOSIS — Z98811 Dental restoration status: Secondary | ICD-10-CM | POA: Insufficient documentation

## 2014-04-10 DIAGNOSIS — Z8739 Personal history of other diseases of the musculoskeletal system and connective tissue: Secondary | ICD-10-CM | POA: Insufficient documentation

## 2014-04-10 DIAGNOSIS — Z8659 Personal history of other mental and behavioral disorders: Secondary | ICD-10-CM | POA: Insufficient documentation

## 2014-04-10 DIAGNOSIS — Z87442 Personal history of urinary calculi: Secondary | ICD-10-CM | POA: Insufficient documentation

## 2014-04-10 DIAGNOSIS — Y9289 Other specified places as the place of occurrence of the external cause: Secondary | ICD-10-CM | POA: Insufficient documentation

## 2014-04-10 DIAGNOSIS — W230XXA Caught, crushed, jammed, or pinched between moving objects, initial encounter: Secondary | ICD-10-CM | POA: Insufficient documentation

## 2014-04-10 DIAGNOSIS — Y9389 Activity, other specified: Secondary | ICD-10-CM | POA: Insufficient documentation

## 2014-04-10 DIAGNOSIS — I10 Essential (primary) hypertension: Secondary | ICD-10-CM | POA: Insufficient documentation

## 2014-04-10 DIAGNOSIS — Z9889 Other specified postprocedural states: Secondary | ICD-10-CM | POA: Insufficient documentation

## 2014-04-10 DIAGNOSIS — S6720XA Crushing injury of unspecified hand, initial encounter: Secondary | ICD-10-CM | POA: Insufficient documentation

## 2014-04-10 DIAGNOSIS — S6722XA Crushing injury of left hand, initial encounter: Secondary | ICD-10-CM

## 2014-04-10 MED ORDER — IBUPROFEN 800 MG PO TABS: 800.0000 mg | ORAL_TABLET | Freq: Three times a day (TID) | ORAL | Status: DC

## 2014-04-10 NOTE — Progress Notes (Signed)
Orthopedic Tech Progress Note Patient Details:  Connor Solomon 11-17-1960 [id number]  Ortho Devices Type of Ortho Device: Thumb velcro splint Ortho Device/Splint Location: lue Ortho Device/Splint Interventions: Application   Francois Elk 04/10/2014, 4:09 PM

## 2014-04-10 NOTE — ED Notes (Signed)
Ortho called for thumb spica  

## 2014-04-10 NOTE — ED Provider Notes (Signed)
CSN: [id number]     Arrival date & time 04/10/14  1405 History  This chart was scribed for non-physician practitioner, Harvie Heck, PA-C working with Richarda Blade, MD by Frederich Balding, ED scribe. This patient was seen in room TR05C/TR05C and the patient's care was started at 3:11 PM.   Chief Complaint  Patient presents with  . Wrist Pain   HPI Comments: Connor Solomon is a 53 y.o. male who presents to the Emergency Department complaining of sudden onset left wrist pain that started earlier today. Pt was installing bathtubs when his wrist and thumb got pinned between the marble and stud. States it weighed about 225 pounds. He has sudden onset pain with associated swelling. Movement worsens the pain. Pt has history of left wrist and thumb surgery to repair a fracture.   Hand specialist: [facility].  The history is provided by the patient. No language interpreter was used.    Past Medical History  Diagnosis Date  . Syncope     resolved- was related to panic attacks  . Full dentures   . Hypertension     Pt states he doen not have HTN and has never been treated for HTN  . Arthritis     hands, elbows bilaterally  . Panic disorder     pt states has resolved  . Kidney stone on left side last stone 4-5 yrs ago    recurrent (3 episodes)   Past Surgical History  Procedure Laterality Date  . Wrist surgery      fx only  . Wisdom tooth extraction    . Open reduction internal fixation (orif) distal radial fracture Left 11/29/2012    Procedure: OPEN REDUCTION INTERNAL FIXATION (ORIF) DISTAL RADIAL FRACTURE;  Surgeon: Schuyler Amor, MD;  Location: [facility];  Service: Orthopedics;  Laterality: Left;  LEFT DISTAL RADIUS OSTEOTOMY WITH BONE GRAFT  . Tendon repair Left 02/07/2013    Procedure: LEFT EXTENSOR INDICIS PROPRIUS  TO EXTENSOR POLLICIS LONGUS TENDON TRANSFER;  Surgeon: Schuyler Amor, MD;  Location: [facility];  Service: Orthopedics;  Laterality: Left;   . Carpal metacarpal fusion with distal radial bone graft Left 05/30/2013    Procedure: LEFT THUMB METACARPAL JOINT FUSION;  Surgeon: Schuyler Amor, MD;  Location: [facility];  Service: Orthopedics;  Laterality: Left;  . Esophagogastroduodenoscopy N/A 02/12/2014    Procedure: ESOPHAGOGASTRODUODENOSCOPY (EGD);  Surgeon: Jerene Bears, MD;  Location: [facility];  Service: Endoscopy;  Laterality: N/A;  . Foreign body removal N/A 02/12/2014    Procedure: FOREIGN BODY REMOVAL;  Surgeon: Jerene Bears, MD;  Location: [facility];  Service: Endoscopy;  Laterality: N/A;   Family History  Problem Relation Age of Onset  . Heart attack Father   . Diabetes Neg Hx   . Hyperlipidemia Neg Hx   . Hypertension Neg Hx    History  Substance Use Topics  . Smoking status: Never Smoker   . Smokeless tobacco: Not on file  . Alcohol Use: No    Review of Systems  Constitutional: Negative for fever and chills.  Musculoskeletal: Positive for arthralgias and joint swelling.  Skin: Negative for color change and wound.  Neurological: Negative for weakness and numbness.  All other systems reviewed and are negative.  Allergies  Hydrocodone  Home Medications   Prior to Admission medications   Medication Sig Start Date End Date Taking? Authorizing Provider  acetaminophen (TYLENOL) 500 MG tablet Take 500-1,000 mg by mouth every 6 (  six) hours as needed for moderate pain.   Yes Historical Provider, MD   BP 129/88  Pulse 77  Temp(Src) 98.1 F (36.7 C) (Oral)  Resp 18  SpO2 100%  Physical Exam  Nursing note and vitals reviewed. Constitutional: He is oriented to person, place, and time. He appears well-developed and well-nourished.  Non-toxic appearance. He does not have a sickly appearance. He does not appear ill. No distress.  HENT:  Head: Normocephalic and atraumatic.  Eyes: Conjunctivae and EOM are normal.  Neck: Neck supple.  Cardiovascular: Normal rate.   Pulmonary/Chest: Effort  normal. No respiratory distress.  Musculoskeletal:       Left elbow: He exhibits normal range of motion and no swelling. No tenderness found.       Left wrist: He exhibits normal range of motion and no tenderness.       Left hand: He exhibits decreased range of motion and bony tenderness. He exhibits normal capillary refill, no deformity, no laceration and no swelling.  Minimal tenderness to dorsum of hand. Decreased range of motion with thumb opposition. Tenderness to palpation of the anatomical snuffbox. Good cap refill.  Sensation intact distally. Well healing surgical scars.  Neurological: He is alert and oriented to person, place, and time.  Skin: Skin is warm and dry. He is not diaphoretic.  Psychiatric: He has a normal mood and affect. His behavior is normal.    ED Course  Procedures (including critical care time)  COORDINATION OF CARE: 3:13 PM-Discussed treatment plan which includes xray with pt at bedside and pt agreed to plan. Advised pt to follow up with his hand surgeon.   Labs Review Labs Reviewed - No data to display  Imaging Review Dg Hand Complete Left  04/10/2014   CLINICAL DATA:  Recent traumatic injury with pain  EXAM: LEFT HAND - COMPLETE 3+ VIEW  COMPARISON:  01/03/2014  FINDINGS: There again noted postsurgical changes in the distal left radius and at the first MCP joint. Posttraumatic changes in the distal ulna and distal fourth digit are again seen and stable. No acute abnormality is noted.  IMPRESSION: Chronic changes without acute abnormality.   Electronically Signed   By: Inez Catalina M.D.   On: 04/10/2014 15:46     EKG Interpretation None      MDM   Final diagnoses:  Crushing injury of left hand, initial encounter   Vision presents with a injury to left hand. Mild tenderness to palpation of the dorsal hand minimal swelling. Anatomical snuffbox tenderness with decreased sensation of the thumb, patient reports is chronic. X-ray negative for acute processes.  Will discharge with some spica do to stop box tenderness follow up with [facility] ice and elevation encourage. Discussed lab results, imaging results, and treatment plan with the patient. Return precautions given. Reports understanding and no other concerns at this time.  Patient is stable for discharge at this time.  Meds given in ED:  Medications - No data to display  New Prescriptions   IBUPROFEN (ADVIL,MOTRIN) 800 MG TABLET    Take 1 tablet (800 mg total) by mouth 3 (three) times daily.     I personally performed the services described in this documentation, which was scribed in my presence. The recorded information has been reviewed and is accurate.  Lorrine Kin, PA-C 04/10/14 [phone]

## 2014-04-10 NOTE — ED Notes (Signed)
Pt c/o left wrist pain that started today while working putting bathtubs in. Pt stated that his wrist and thumb got pinned between cultured marble (225lbs) and stud. Pt has hx of wrist surgery and since has not been able to move thumb as well. Pt has some numbness to left thumb which is not normal for him. Pt still has some sensation to thumb.

## 2014-04-10 NOTE — Discharge Instructions (Signed)
Call Dr. Burney Gauze for further evaluation of your hand injury, keep your splint on your arm until you are released by an Orthopedic specialist. Call for a follow up appointment with a Family or Primary Care Provider.  Return if Symptoms worsen.   Take medication as prescribed.  Elevate your hand above your heart to decrease the swelling. Ice your hand 3-4 times a day.

## 2014-04-11 NOTE — ED Provider Notes (Signed)
Medical screening examination/treatment/procedure(s) were performed by non-physician practitioner and as supervising physician I was immediately available for consultation/collaboration.  Richarda Blade, MD 04/11/14 [phone]

## 2014-05-10 ENCOUNTER — Emergency Department: Payer: Self-pay | Admitting: Emergency Medicine

## 2014-07-14 ENCOUNTER — Emergency Department: Payer: Self-pay | Admitting: Emergency Medicine

## 2014-07-17 ENCOUNTER — Emergency Department (HOSPITAL_COMMUNITY)
Admission: EM | Admit: 2014-07-17 | Discharge: 2014-07-17 | Disposition: A | Discharge status: Home / Self Care | Payer: Self-pay | Attending: Emergency Medicine | Admitting: Emergency Medicine

## 2014-07-17 ENCOUNTER — Emergency Department (HOSPITAL_COMMUNITY): Payer: Self-pay

## 2014-07-17 ENCOUNTER — Encounter (HOSPITAL_COMMUNITY): Payer: Self-pay | Admitting: Emergency Medicine

## 2014-07-17 DIAGNOSIS — Z87442 Personal history of urinary calculi: Secondary | ICD-10-CM | POA: Insufficient documentation

## 2014-07-17 DIAGNOSIS — I10 Essential (primary) hypertension: Secondary | ICD-10-CM | POA: Insufficient documentation

## 2014-07-17 DIAGNOSIS — Z98811 Dental restoration status: Secondary | ICD-10-CM | POA: Insufficient documentation

## 2014-07-17 DIAGNOSIS — Y9289 Other specified places as the place of occurrence of the external cause: Secondary | ICD-10-CM | POA: Insufficient documentation

## 2014-07-17 DIAGNOSIS — W010XXA Fall on same level from slipping, tripping and stumbling without subsequent striking against object, initial encounter: Secondary | ICD-10-CM | POA: Insufficient documentation

## 2014-07-17 DIAGNOSIS — M25421 Effusion, right elbow: Secondary | ICD-10-CM | POA: Insufficient documentation

## 2014-07-17 DIAGNOSIS — Z8659 Personal history of other mental and behavioral disorders: Secondary | ICD-10-CM | POA: Insufficient documentation

## 2014-07-17 DIAGNOSIS — Y9389 Activity, other specified: Secondary | ICD-10-CM | POA: Insufficient documentation

## 2014-07-17 MED ORDER — IBUPROFEN 600 MG PO TABS: 600.0000 mg | ORAL_TABLET | Freq: Four times a day (QID) | ORAL | Status: DC | PRN

## 2014-07-17 MED ORDER — OXYCODONE-ACETAMINOPHEN 5-325 MG PO TABS: 1.0000 | ORAL_TABLET | Freq: Four times a day (QID) | ORAL | Status: DC | PRN

## 2014-07-17 NOTE — ED Provider Notes (Signed)
Medical screening examination/treatment/procedure(s) were performed by non-physician practitioner and as supervising physician I was immediately available for consultation/collaboration.   EKG Interpretation None        Evelina Bucy, MD 07/17/14 [phone]

## 2014-07-17 NOTE — ED Notes (Signed)
Pt. injured his right elbow last Saturday after he slipped and twisted his right elbow , presents with pain /mild swelling at right elbow.

## 2014-07-17 NOTE — Discharge Instructions (Signed)
Please read and follow all provided instructions.  Your diagnoses today include:  1. Elbow effusion, right     Tests performed today include:  An x-ray of the affected area - does NOT show any broken bones  Vital signs. See below for your results today.   Medications prescribed:   Percocet (oxycodone/acetaminophen) - narcotic pain medication  DO NOT drive or perform any activities that require you to be awake and alert because this medicine can make you drowsy. BE VERY CAREFUL not to take multiple medicines containing Tylenol (also called acetaminophen). Doing so can lead to an overdose which can damage your liver and cause liver failure and possibly death.   Ibuprofen (Motrin, Advil) - anti-inflammatory pain medication  Do not exceed $RemoveBe'600mg'[id number]$  ibuprofen every 6 hours, take with food  You have been prescribed an anti-inflammatory medication or NSAID. Take with food. Take smallest effective dose for the shortest duration needed for your pain. Stop taking if you experience stomach pain or vomiting.   Take any prescribed medications only as directed.  Home care instructions:   Follow any educational materials contained in this packet  Follow R.I.C.E. Protocol:  R - rest your injury   I  - use ice on injury without applying directly to skin  C - compress injury with bandage or splint  E - elevate the injury as much as possible  Follow-up instructions: Please follow-up with your primary care provider or the provided orthopedic physician (bone specialist) if you continue to have significant pain in 1 week. In this case you may have a severe injury that requires further care.   Return instructions:   Please return if your toes are numb or tingling, appear gray or blue, or you have severe pain (also elevate leg and loosen splint or wrap if you were given one)  Please return to the Emergency Department if you experience worsening symptoms.   Please return if you have any other  emergent concerns.  Additional Information:  Your vital signs today were: BP 129/96   Pulse 94   Temp(Src) 97.5 F (36.4 C) (Oral)   Resp 20   Ht $R'5\' 8"'ku$  (1.727 m)   Wt 165 lb (74.844 kg)   BMI 25.09 kg/m2   SpO2 100% If your blood pressure (BP) was elevated above 135/85 this visit, please have this repeated by your doctor within one month. --------------

## 2014-07-17 NOTE — ED Provider Notes (Signed)
CSN: [id number]     Arrival date & time 07/17/14  3377 History   First MD Initiated Contact with Patient 07/17/14 [phone]     Chief Complaint  Patient presents with  . Elbow Injury     (Consider location/radiation/quality/duration/timing/severity/associated sxs/prior Treatment) HPI Comments: Patient presents with complaint of right elbow pain after a slip and fall 4 days ago. Patient states that as he fell he grabbed a railing and twisted his elbow. He initially had worsening swelling which is gradually improving. He has also had some decreased sensation to the fourth and fifth digits but no weakness or numbness. The onset of this condition was acute. The course is constant. Aggravating factors: movement and palpation. Alleviating factors: none.    The history is provided by the patient.    Past Medical History  Diagnosis Date  . Syncope     resolved- was related to panic attacks  . Full dentures   . Hypertension     Pt states he doen not have HTN and has never been treated for HTN  . Arthritis     hands, elbows bilaterally  . Panic disorder     pt states has resolved  . Kidney stone on left side last stone 4-5 yrs ago    recurrent (3 episodes)   Past Surgical History  Procedure Laterality Date  . Wrist surgery      fx only  . Wisdom tooth extraction    . Open reduction internal fixation (orif) distal radial fracture Left 11/29/2012    Procedure: OPEN REDUCTION INTERNAL FIXATION (ORIF) DISTAL RADIAL FRACTURE;  Surgeon: [name], MD;  Location: [facility];  Service: Orthopedics;  Laterality: Left;  LEFT DISTAL RADIUS OSTEOTOMY WITH BONE GRAFT  . Tendon repair Left 02/07/2013    Procedure: LEFT EXTENSOR INDICIS PROPRIUS  TO EXTENSOR POLLICIS LONGUS TENDON TRANSFER;  Surgeon: [name], MD;  Location: [facility];  Service: Orthopedics;  Laterality: Left;  . Carpal metacarpal fusion with distal radial bone graft Left 05/30/2013   Procedure: LEFT THUMB METACARPAL JOINT FUSION;  Surgeon: [name], MD;  Location: [facility];  Service: Orthopedics;  Laterality: Left;  . Esophagogastroduodenoscopy N/A 02/12/2014    Procedure: ESOPHAGOGASTRODUODENOSCOPY (EGD);  Surgeon: [name], MD;  Location: [facility];  Service: Endoscopy;  Laterality: N/A;  . Foreign body removal N/A 02/12/2014    Procedure: FOREIGN BODY REMOVAL;  Surgeon: [name], MD;  Location: [facility];  Service: Endoscopy;  Laterality: N/A;   Family History  Problem Relation Age of Onset  . Heart attack Father   . Diabetes Neg Hx   . Hyperlipidemia Neg Hx   . Hypertension Neg Hx    History  Substance Use Topics  . Smoking status: Never Smoker   . Smokeless tobacco: Not on file  . Alcohol Use: No    Review of Systems  Constitutional: Negative for activity change.  Musculoskeletal: Positive for arthralgias and joint swelling. Negative for back pain, gait problem and neck pain.  Skin: Negative for wound.  Neurological: Negative for weakness and numbness.      Allergies  Hydrocodone  Home Medications   Prior to Admission medications   Medication Sig Start Date End Date Taking? Authorizing Provider  ibuprofen (ADVIL,MOTRIN) 600 MG tablet Take 1 tablet (600 mg total) by mouth every 6 (six) hours as needed. 07/17/14   Renne Crigler, PA-C  oxyCODONE-acetaminophen (PERCOCET/ROXICET) 5-325 MG per tablet Take 1-2 tablets by mouth  every 6 (six) hours as needed for severe pain. 07/17/14   Carlisle Cater, PA-C   BP 129/96  Pulse 94  Temp(Src) 97.5 F (36.4 C) (Oral)  Resp 20  Ht $R'5\' 8"'eK$  (1.727 m)  Wt 165 lb (74.844 kg)  BMI 25.09 kg/m2  SpO2 100% Physical Exam  Nursing note and vitals reviewed. Constitutional: He appears well-developed and well-nourished.  HENT:  Head: Normocephalic and atraumatic.  Eyes: Conjunctivae are normal.  Neck: Normal range of motion. Neck supple.  Cardiovascular: Normal pulses.   Pulses:       Radial pulses are 2+ on the right side, and 2+ on the left side.  Musculoskeletal: He exhibits tenderness. He exhibits no edema.  Neurological: He is alert. No sensory deficit.  Motor, sensation, and vascular distal to the injury is fully intact however patient reports some decreased sensation in hand, ulnar distribution. Normal strength in fingers and hand.   Skin: Skin is warm and dry.  Psychiatric: He has a normal mood and affect.    ED Course  Procedures (including critical care time) Labs Review Labs Reviewed - No data to display  Imaging Review Dg Elbow Complete Right  07/17/2014   CLINICAL DATA:  Patient fell all 4 days ago and twisted L lobe. Now posterior pain with numbness in fingers.  EXAM: RIGHT ELBOW - COMPLETE 3+ VIEW  COMPARISON:  None.  FINDINGS: There is no evidence of fracture, dislocation, or joint effusion. There is no evidence of arthropathy or other focal bone abnormality. Soft tissues are unremarkable.  IMPRESSION: Negative.   Electronically Signed   By: Misty Stanley M.D.   On: 07/17/2014 07:04     EKG Interpretation None      7:43 AM Patient seen and examined. Will treat with sling, anti-inflammatories, pain medication, and orthopedic followup.  Vital signs reviewed and are as follows: BP 129/96  Pulse 94  Temp(Src) 97.5 F (36.4 C) (Oral)  Resp 20  Ht $R'5\' 8"'aW$  (1.727 m)  Wt 165 lb (74.844 kg)  BMI 25.09 kg/m2  SpO2 100%  Patient was counseled on RICE protocol and told to rest injury, use ice for no longer than 15 minutes every hour, compress the area, and elevate above the level of their heart as much as possible to reduce swelling. Questions answered. Patient verbalized understanding.    Patient counseled on use of narcotic pain medications. Counseled not to combine these medications with others containing tylenol. Urged not to drink alcohol, drive, or perform any other activities that requires focus while taking these medications. The patient  verbalizes understanding and agrees with the plan.   MDM   Final diagnoses:  Elbow effusion, right   Patient with elbow effusion, negative x-ray. Conservative measures indicated. Patient does have some decreased sensation of 4th and 5th digit consistent with ulnar nerve compression. No weakness or true numbness. Orthopedic referral given.    Carlisle Cater, PA-C 07/17/14 [phone]

## 2014-07-20 ENCOUNTER — Emergency Department: Payer: Self-pay | Admitting: Emergency Medicine

## 2014-08-08 ENCOUNTER — Emergency Department: Payer: Self-pay | Admitting: Emergency Medicine

## 2014-08-11 ENCOUNTER — Emergency Department (HOSPITAL_COMMUNITY)
Admission: EM | Admit: 2014-08-11 | Discharge: 2014-08-11 | Disposition: A | Discharge status: Home / Self Care | Payer: Self-pay | Attending: Emergency Medicine | Admitting: Emergency Medicine

## 2014-08-11 ENCOUNTER — Emergency Department (HOSPITAL_COMMUNITY): Payer: Self-pay

## 2014-08-11 ENCOUNTER — Encounter (HOSPITAL_COMMUNITY): Payer: Self-pay | Admitting: *Deleted

## 2014-08-11 DIAGNOSIS — I10 Essential (primary) hypertension: Secondary | ICD-10-CM | POA: Insufficient documentation

## 2014-08-11 DIAGNOSIS — S3992XA Unspecified injury of lower back, initial encounter: Secondary | ICD-10-CM | POA: Insufficient documentation

## 2014-08-11 DIAGNOSIS — Z8659 Personal history of other mental and behavioral disorders: Secondary | ICD-10-CM | POA: Insufficient documentation

## 2014-08-11 DIAGNOSIS — X58XXXA Exposure to other specified factors, initial encounter: Secondary | ICD-10-CM | POA: Insufficient documentation

## 2014-08-11 DIAGNOSIS — M549 Dorsalgia, unspecified: Secondary | ICD-10-CM

## 2014-08-11 DIAGNOSIS — Y9389 Activity, other specified: Secondary | ICD-10-CM | POA: Insufficient documentation

## 2014-08-11 DIAGNOSIS — Y9289 Other specified places as the place of occurrence of the external cause: Secondary | ICD-10-CM | POA: Insufficient documentation

## 2014-08-11 DIAGNOSIS — Z87442 Personal history of urinary calculi: Secondary | ICD-10-CM | POA: Insufficient documentation

## 2014-08-11 DIAGNOSIS — Z8739 Personal history of other diseases of the musculoskeletal system and connective tissue: Secondary | ICD-10-CM | POA: Insufficient documentation

## 2014-08-11 DIAGNOSIS — Z98811 Dental restoration status: Secondary | ICD-10-CM | POA: Insufficient documentation

## 2014-08-11 MED ORDER — DIAZEPAM 5 MG PO TABS
10.0000 mg | ORAL_TABLET | Freq: Once | ORAL | Status: AC
  Administered 2014-08-11: 10 mg via ORAL
  Filled 2014-08-11: qty 2

## 2014-08-11 MED ORDER — OXYCODONE-ACETAMINOPHEN 5-325 MG PO TABS
1.0000 | ORAL_TABLET | Freq: Once | ORAL | Status: AC
  Administered 2014-08-11: 1 via ORAL
  Filled 2014-08-11: qty 1

## 2014-08-11 MED ORDER — OXYCODONE-ACETAMINOPHEN 5-325 MG PO TABS: 1.0000 | ORAL_TABLET | Freq: Four times a day (QID) | ORAL | Status: DC | PRN

## 2014-08-11 MED ORDER — DIAZEPAM 5 MG PO TABS: 5.0000 mg | ORAL_TABLET | Freq: Four times a day (QID) | ORAL | Status: DC | PRN

## 2014-08-11 MED ORDER — PROMETHAZINE HCL 25 MG PO TABS: 25.0000 mg | ORAL_TABLET | Freq: Four times a day (QID) | ORAL | Status: DC | PRN

## 2014-08-11 MED ORDER — ONDANSETRON 4 MG PO TBDP
4.0000 mg | ORAL_TABLET | Freq: Once | ORAL | Status: AC
  Administered 2014-08-11: 4 mg via ORAL
  Filled 2014-08-11: qty 1

## 2014-08-11 NOTE — Discharge Instructions (Signed)
Back Pain, Adult Low back pain is very common. About 1 in 5 people have back pain.The cause of low back pain is rarely dangerous. The pain often gets better over time.About half of people with a sudden onset of back pain feel better in just 2 weeks. About 8 in 10 people feel better by 6 weeks.  CAUSES Some common causes of back pain include:  Strain of the muscles or ligaments supporting the spine.  Wear and tear (degeneration) of the spinal discs.  Arthritis.  Direct injury to the back. DIAGNOSIS Most of the time, the direct cause of low back pain is not known.However, back pain can be treated effectively even when the exact cause of the pain is unknown.Answering your caregiver's questions about your overall health and symptoms is one of the most accurate ways to make sure the cause of your pain is not dangerous. If your caregiver needs more information, he or she may order lab work or imaging tests (X-rays or MRIs).However, even if imaging tests show changes in your back, this usually does not require surgery. HOME CARE INSTRUCTIONS For many people, back pain returns.Since low back pain is rarely dangerous, it is often a condition that people can learn to manageon their own.   Remain active. It is stressful on the back to sit or stand in one place. Do not sit, drive, or stand in one place for more than 30 minutes at a time. Take short walks on level surfaces as soon as pain allows.Try to increase the length of time you walk each day.  Do not stay in bed.Resting more than 1 or 2 days can delay your recovery.  Do not avoid exercise or work.Your body is made to move.It is not dangerous to be active, even though your back may hurt.Your back will likely heal faster if you return to being active before your pain is gone.  Pay attention to your body when you bend and lift. Many people have less discomfortwhen lifting if they bend their knees, keep the load close to their bodies,and  avoid twisting. Often, the most comfortable positions are those that put less stress on your recovering back.  Find a comfortable position to sleep. Use a firm mattress and lie on your side with your knees slightly bent. If you lie on your back, put a pillow under your knees.  Only take over-the-counter or prescription medicines as directed by your caregiver. Over-the-counter medicines to reduce pain and inflammation are often the most helpful.Your caregiver may prescribe muscle relaxant drugs.These medicines help dull your pain so you can more quickly return to your normal activities and healthy exercise.  Put ice on the injured area.  Put ice in a plastic bag.  Place a towel between your skin and the bag.  Leave the ice on for 15-20 minutes, 03-04 times a day for the first 2 to 3 days. After that, ice and heat may be alternated to reduce pain and spasms.  Ask your caregiver about trying back exercises and gentle massage. This may be of some benefit.  Avoid feeling anxious or stressed.Stress increases muscle tension and can worsen back pain.It is important to recognize when you are anxious or stressed and learn ways to manage it.Exercise is a great option. SEEK MEDICAL CARE IF:  You have pain that is not relieved with rest or medicine.  You have pain that does not improve in 1 week.  You have new symptoms.  You are generally not feeling well. SEEK   IMMEDIATE MEDICAL CARE IF:   You have pain that radiates from your back into your legs.  You develop new bowel or bladder control problems.  You have unusual weakness or numbness in your arms or legs.  You develop nausea or vomiting.  You develop abdominal pain.  You feel faint. Document Released: 09/27/2005 Document Revised: 03/28/2012 Document Reviewed: 01/29/2014 Exit[employer] Patient Information 2015 [employer]. This information is not intended to replace advice given to you by your health care provider. Make sure you  discuss any questions you have with your health care provider.  

## 2014-08-11 NOTE — ED Notes (Signed)
Declined W/C at D/C and was escorted to lobby by RN. 

## 2014-08-11 NOTE — ED Notes (Signed)
Pt reports he lifted something heavy and twisting.

## 2014-08-11 NOTE — ED Provider Notes (Signed)
CSN: [id number]     Arrival date & time 08/11/14  0803 History  This chart was scribed for non-physician practitioner, Cleatrice Burke, PA-C,working with Hoy Morn, MD, by Marlowe Kays, ED Scribe. This patient was seen in room TR05C/TR05C and the patient's care was started at 9:18 AM.  Chief Complaint  Patient presents with  . Back Pain   Patient is a 53 y.o. male presenting with back pain. The history is provided by the patient. No language interpreter was used.  Back Pain Associated symptoms: no fever, no numbness and no weakness     HPI Comments:  Connor Solomon is a 53 y.o. male with PMH of panic disorder, arthritis, HTN and kidney stones who presents to the Emergency Department complaining of severe constant, sharp low back pain that began two days ago after lifting a heavy object at work. He states lying, sitting and moving makes the pain worse. Bowel movements increase his pain as well. He denies any alleviating factors. Reports taking Doan's back pain medication and Ibuprofen with no significant relief of the pain. Denies numbness, tingling or weakness of the lower extremities, loss of bowel or bladder function, fever or chills. Denies h/o IV drug use or h/o cancer. Pt has been ambulatory since the injury.  Past Medical History  Diagnosis Date  . Syncope     resolved- was related to panic attacks  . Full dentures   . Hypertension     Pt states he doen not have HTN and has never been treated for HTN  . Arthritis     hands, elbows bilaterally  . Panic disorder     pt states has resolved  . Kidney stone on left side last stone 4-5 yrs ago    recurrent (3 episodes)   Past Surgical History  Procedure Laterality Date  . Wrist surgery      fx only  . Wisdom tooth extraction    . Open reduction internal fixation (orif) distal radial fracture Left 11/29/2012    Procedure: OPEN REDUCTION INTERNAL FIXATION (ORIF) DISTAL RADIAL FRACTURE;  Surgeon: Schuyler Amor, MD;  Location:  [place];  Service: Orthopedics;  Laterality: Left;  LEFT DISTAL RADIUS OSTEOTOMY WITH BONE GRAFT  . Tendon repair Left 02/07/2013    Procedure: LEFT EXTENSOR INDICIS PROPRIUS  TO EXTENSOR POLLICIS LONGUS TENDON TRANSFER;  Surgeon: Schuyler Amor, MD;  Location: [place];  Service: Orthopedics;  Laterality: Left;  . Carpal metacarpal fusion with distal radial bone graft Left 05/30/2013    Procedure: LEFT THUMB METACARPAL JOINT FUSION;  Surgeon: Schuyler Amor, MD;  Location: [place];  Service: Orthopedics;  Laterality: Left;  . Esophagogastroduodenoscopy N/A 02/12/2014    Procedure: ESOPHAGOGASTRODUODENOSCOPY (EGD);  Surgeon: Jerene Bears, MD;  Location: [facility];  Service: Endoscopy;  Laterality: N/A;  . Foreign body removal N/A 02/12/2014    Procedure: FOREIGN BODY REMOVAL;  Surgeon: Jerene Bears, MD;  Location: [place];  Service: Endoscopy;  Laterality: N/A;   Family History  Problem Relation Age of Onset  . Heart attack Father   . Diabetes Neg Hx   . Hyperlipidemia Neg Hx   . Hypertension Neg Hx    History  Substance Use Topics  . Smoking status: Never Smoker   . Smokeless tobacco: Not on file  . Alcohol Use: No    Review of Systems  Constitutional: Negative for fever and chills.  Genitourinary:       No  bowel or bladder incontinence.  Musculoskeletal: Positive for back pain.  Neurological: Negative for weakness and numbness.  All other systems reviewed and are negative.   Allergies  Hydrocodone  Home Medications   Prior to Admission medications   Medication Sig Start Date End Date Taking? Authorizing Provider  ibuprofen (ADVIL,MOTRIN) 600 MG tablet Take 1 tablet (600 mg total) by mouth every 6 (six) hours as needed. 07/17/14   Carlisle Cater, PA-C  oxyCODONE-acetaminophen (PERCOCET/ROXICET) 5-325 MG per tablet Take 1-2 tablets by mouth every 6 (six) hours as needed for severe pain. 07/17/14   Carlisle Cater, PA-C    Triage Vitals: BP 153/83 mmHg  Pulse 103  Temp(Src) 97.7 F (36.5 C) (Oral)  Resp 20  SpO2 100% Physical Exam  Constitutional: He is oriented to person, place, and time. He appears well-developed and well-nourished. He appears distressed.  Pt appears uncomfortable.  HENT:  Head: Normocephalic and atraumatic.  Right Ear: External ear normal.  Left Ear: External ear normal.  Nose: Nose normal.  Eyes: Conjunctivae are normal.  Neck: Normal range of motion. No tracheal deviation present.  Cardiovascular: Normal rate, regular rhythm and normal heart sounds.   PT pulses 2+ bilaterally.  Pulmonary/Chest: Effort normal. No stridor.  Abdominal: Soft. He exhibits no distension. There is no tenderness.  Musculoskeletal: Normal range of motion. He exhibits tenderness. He exhibits no edema.  Tender to palpation to lumbar spine.  Neurological: He is alert and oriented to person, place, and time.  Reflex Scores:      Patellar reflexes are 2+ on the right side and 2+ on the left side. Sensations intact distally. Strength 5/5 in bilateral lower extremities.  Skin: Skin is warm and dry. He is not diaphoretic.  Psychiatric: He has a normal mood and affect. His behavior is normal.  Nursing note and vitals reviewed.   ED Course  Procedures (including critical care time) DIAGNOSTIC STUDIES: Oxygen Saturation is 100% on RA, normal by my interpretation.   COORDINATION OF CARE: 9:22 AM- Will X-Ray L-Spine, order an additional Percocet and muscle relaxer. Pt verbalizes understanding and agrees to plan.  Medications  oxyCODONE-acetaminophen (PERCOCET/ROXICET) 5-325 MG per tablet 1 tablet (1 tablet Oral Given 08/11/14 0823)  ondansetron (ZOFRAN-ODT) disintegrating tablet 4 mg (4 mg Oral Given 08/11/14 0823)  diazepam (VALIUM) tablet 10 mg (10 mg Oral Given 08/11/14 0935)  oxyCODONE-acetaminophen (PERCOCET/ROXICET) 5-325 MG per tablet 1 tablet (1 tablet Oral Given 08/11/14 0935)   Labs Review Labs  Reviewed - No data to display  Imaging Review Dg Lumbar Spine Complete  08/11/2014   CLINICAL DATA:  Low back pain.  EXAM: LUMBAR SPINE - COMPLETE 4+ VIEW  COMPARISON:  None.  FINDINGS: Paraspinal soft tissues are unremarkable. No acute bony abnormality identified. Diffuse degenerative change present. Normal bony alignment.  IMPRESSION: Diffuse degenerative change.  No acute abnormality.   Electronically Signed   By: Marcello Moores  Register   On: 08/11/2014 11:01     EKG Interpretation None      MDM   Final diagnoses:  Back pain    Patient with back pain.  No neurological deficits and normal neuro exam.  Patient can walk but states is painful.  No loss of bowel or bladder control.  No concern for cauda equina.  No fever, night sweats, weight loss, h/o cancer, IVDU.  RICE protocol and pain medicine indicated and discussed with patient. Patient is to follow up in 2 days to ensure improvement of sx.   I personally performed the services  described in this documentation, which was scribed in my presence. The recorded information has been reviewed and is accurate.    Elwyn Lade, PA-C 08/14/14 [place], MD 08/14/14 [phone]

## 2014-08-14 ENCOUNTER — Telehealth: Payer: Self-pay | Admitting: *Deleted

## 2014-08-14 NOTE — Telephone Encounter (Signed)
Pt states that he was in the ED on Sunday.  States that he will need to see his PCP and possibly get a referral for an ortho MD.  Appt made for 08/21/14, but pt wants to know if anything can be done until then. [place], Salome Spotted

## 2014-08-15 NOTE — Telephone Encounter (Signed)
Nothing further needs to be done at this time.  He seen for back pain and does not need a referral at this time.

## 2014-08-17 ENCOUNTER — Emergency Department: Payer: Self-pay | Admitting: Emergency Medicine

## 2014-08-18 ENCOUNTER — Encounter (HOSPITAL_COMMUNITY): Payer: Self-pay | Admitting: Emergency Medicine

## 2014-08-18 ENCOUNTER — Emergency Department (HOSPITAL_COMMUNITY)
Admission: EM | Admit: 2014-08-18 | Discharge: 2014-08-18 | Disposition: A | Discharge status: Home / Self Care | Payer: Self-pay | Attending: Emergency Medicine | Admitting: Emergency Medicine

## 2014-08-18 DIAGNOSIS — M1990 Unspecified osteoarthritis, unspecified site: Secondary | ICD-10-CM | POA: Insufficient documentation

## 2014-08-18 DIAGNOSIS — M545 Low back pain, unspecified: Secondary | ICD-10-CM

## 2014-08-18 DIAGNOSIS — Z972 Presence of dental prosthetic device (complete) (partial): Secondary | ICD-10-CM | POA: Insufficient documentation

## 2014-08-18 DIAGNOSIS — Z8659 Personal history of other mental and behavioral disorders: Secondary | ICD-10-CM | POA: Insufficient documentation

## 2014-08-18 DIAGNOSIS — Z87442 Personal history of urinary calculi: Secondary | ICD-10-CM | POA: Insufficient documentation

## 2014-08-18 DIAGNOSIS — Z87828 Personal history of other (healed) physical injury and trauma: Secondary | ICD-10-CM | POA: Insufficient documentation

## 2014-08-18 DIAGNOSIS — Z98811 Dental restoration status: Secondary | ICD-10-CM | POA: Insufficient documentation

## 2014-08-18 DIAGNOSIS — Z760 Encounter for issue of repeat prescription: Secondary | ICD-10-CM | POA: Insufficient documentation

## 2014-08-18 DIAGNOSIS — I10 Essential (primary) hypertension: Secondary | ICD-10-CM | POA: Insufficient documentation

## 2014-08-18 MED ORDER — NAPROXEN 500 MG PO TABS: 500.0000 mg | ORAL_TABLET | Freq: Two times a day (BID) | ORAL | Status: DC

## 2014-08-18 MED ORDER — METHOCARBAMOL 500 MG PO TABS
1000.0000 mg | ORAL_TABLET | Freq: Once | ORAL | Status: AC
  Administered 2014-08-18: 1000 mg via ORAL
  Filled 2014-08-18: qty 2

## 2014-08-18 MED ORDER — OXYCODONE-ACETAMINOPHEN 5-325 MG PO TABS
2.0000 | ORAL_TABLET | Freq: Once | ORAL | Status: AC
  Administered 2014-08-18: 2 via ORAL
  Filled 2014-08-18: qty 2

## 2014-08-18 MED ORDER — METHOCARBAMOL 750 MG PO TABS: 750.0000 mg | ORAL_TABLET | Freq: Three times a day (TID) | ORAL | Status: DC | PRN

## 2014-08-18 MED ORDER — NAPROXEN 250 MG PO TABS
500.0000 mg | ORAL_TABLET | Freq: Once | ORAL | Status: AC
  Administered 2014-08-18: 500 mg via ORAL
  Filled 2014-08-18: qty 2

## 2014-08-18 NOTE — ED Notes (Signed)
Pt verbalizes understanding of d/c instructions and directions fort follow-up care. Pt voices no additional questions or concerns at this time.

## 2014-08-18 NOTE — ED Provider Notes (Signed)
CSN: [id number]     Arrival date & time 08/18/14  0201 History   First MD Initiated Contact with Patient 08/18/14 0228     Chief Complaint  Patient presents with  . Back Pain  . Medication Refill     (Consider location/radiation/quality/duration/timing/severity/associated sxs/prior Treatment) HPI 53 year old male presents to the emergency department with complaint of persistent low back pain after work injury.  Patient was seen in the emergency department on the first, given prescriptions for Percocet,Valium, Phenergan.  Her discharge patient reports that he has run out of the medications.  He does not have appointment with his primary care doctor until this Wednesday.  Patient reports he's been unable to follow-up with orthopedics secondary to the secretary at his work being away and not being able to fill out the [employer] Paperwork.  Patient denies any bowel or bladder incontinence, no weakness or numbness into his legs. Past Medical History  Diagnosis Date  . Syncope     resolved- was related to panic attacks  . Full dentures   . Hypertension     Pt states he doen not have HTN and has never been treated for HTN  . Arthritis     hands, elbows bilaterally  . Panic disorder     pt states has resolved  . Kidney stone on left side last stone 4-5 yrs ago    recurrent (3 episodes)   Past Surgical History  Procedure Laterality Date  . Wrist surgery      fx only  . Wisdom tooth extraction    . Open reduction internal fixation (orif) distal radial fracture Left 11/29/2012    Procedure: OPEN REDUCTION INTERNAL FIXATION (ORIF) DISTAL RADIAL FRACTURE;  Surgeon: Schuyler Amor, MD;  Location: [place];  Service: Orthopedics;  Laterality: Left;  LEFT DISTAL RADIUS OSTEOTOMY WITH BONE GRAFT  . Tendon repair Left 02/07/2013    Procedure: LEFT EXTENSOR INDICIS PROPRIUS  TO EXTENSOR POLLICIS LONGUS TENDON TRANSFER;  Surgeon: Schuyler Amor, MD;  Location: [place];  Service: Orthopedics;  Laterality: Left;  . Carpal metacarpal fusion with distal radial bone graft Left 05/30/2013    Procedure: LEFT THUMB METACARPAL JOINT FUSION;  Surgeon: Schuyler Amor, MD;  Location: [place];  Service: Orthopedics;  Laterality: Left;  . Esophagogastroduodenoscopy N/A 02/12/2014    Procedure: ESOPHAGOGASTRODUODENOSCOPY (EGD);  Surgeon: Jerene Bears, MD;  Location: [facility] ENDOSCOPY;  Service: Endoscopy;  Laterality: N/A;  . Foreign body removal N/A 02/12/2014    Procedure: FOREIGN BODY REMOVAL;  Surgeon: Jerene Bears, MD;  Location: [place];  Service: Endoscopy;  Laterality: N/A;   Family History  Problem Relation Age of Onset  . Heart attack Father   . Diabetes Neg Hx   . Hyperlipidemia Neg Hx   . Hypertension Neg Hx    History  Substance Use Topics  . Smoking status: Never Smoker   . Smokeless tobacco: Not on file  . Alcohol Use: No    Review of Systems   See History of Present Illness; otherwise all other systems are reviewed and negative  Allergies  Hydrocodone  Home Medications   Prior to Admission medications   Medication Sig Start Date End Date Taking? Authorizing Provider  diazepam (VALIUM) 5 MG tablet Take 1 tablet (5 mg total) by mouth every 6 (six) hours as needed for anxiety. 08/11/14   Elwyn Lade, PA-C  ibuprofen (ADVIL,MOTRIN) 600 MG tablet Take 1 tablet (600 mg total)  by mouth every 6 (six) hours as needed. 07/17/14   Carlisle Cater, PA-C  methocarbamol (ROBAXIN-750) 750 MG tablet Take 1 tablet (750 mg total) by mouth every 8 (eight) hours as needed for muscle spasms. 08/18/14   Kalman Drape, MD  naproxen (NAPROSYN) 500 MG tablet Take 1 tablet (500 mg total) by mouth 2 (two) times daily. 08/18/14   Kalman Drape, MD  oxyCODONE-acetaminophen (PERCOCET/ROXICET) 5-325 MG per tablet Take 1 tablet by mouth every 6 (six) hours as needed for severe pain. May take 2 tablets PO q 6 hours for severe pain - Do not take  with Tylenol as this tablet already contains tylenol 08/11/14   Elwyn Lade, PA-C  promethazine (PHENERGAN) 25 MG tablet Take 1 tablet (25 mg total) by mouth every 6 (six) hours as needed for nausea or vomiting. 08/11/14   Kara Mead Merrell, PA-C   BP 144/93 mmHg  Pulse 82  Temp(Src) 98 F (36.7 C) (Oral)  Resp 18  Ht $R'5\' 8"'oB$  (1.727 m)  Wt 165 lb (74.844 kg)  BMI 25.09 kg/m2  SpO2 98% Physical Exam  Constitutional: He is oriented to person, place, and time. He appears well-developed and well-nourished. He appears distressed (uncomfortable appearing).  HENT:  Head: Normocephalic and atraumatic.  Nose: Nose normal.  Mouth/Throat: Oropharynx is clear and moist.  Eyes: Conjunctivae and EOM are normal. Pupils are equal, round, and reactive to light.  Neck: Normal range of motion. Neck supple. No JVD present. No tracheal deviation present. No thyromegaly present.  Cardiovascular: Normal rate, regular rhythm, normal heart sounds and intact distal pulses.  Exam reveals no gallop and no friction rub.   No murmur heard. Pulmonary/Chest: Effort normal and breath sounds normal. No stridor. No respiratory distress. He has no wheezes. He has no rales. He exhibits no tenderness.  Abdominal: Soft. Bowel sounds are normal. He exhibits no distension and no mass. There is no tenderness. There is no rebound and no guarding.  Musculoskeletal: Normal range of motion. He exhibits tenderness (tenderness to palpation atextreme lumbar and of spine without step-off or crepitus.). He exhibits no edema.  Lymphadenopathy:    He has no cervical adenopathy.  Neurological: He is alert and oriented to person, place, and time. He displays normal reflexes. He exhibits normal muscle tone. Coordination normal.  Skin: Skin is warm and dry. No rash noted. No erythema. No pallor.  Psychiatric: He has a normal mood and affect. His behavior is normal. Judgment and thought content normal.  Nursing note and vitals  reviewed.   ED Course  Procedures (including critical care time) Labs Review Labs Reviewed - No data to display  Imaging Review No results found.   EKG Interpretation None      MDM   Final diagnoses:  Bilateral low back pain without sciatica   53 year old male with persistent low back pain after work injury.  Patient received Percocet one week ago,, and also at the beginning of October for another pain related visit.  I have explained to the patient that I do not feel comfortable refilling his Percocet prescription and he will need to follow-up with his doctor as scheduled.  Patient to receive Percocet 2 tablets here in the emergency department, and a prescription for Naprosyn and Robaxin to help with pain in the meantime.    Kalman Drape, MD 08/18/14 [phone]

## 2014-08-18 NOTE — ED Notes (Signed)
Patient here with complaint of lower back pain. States that he was injured at work over a week ago. Was seen here for the same and discharged home with pain medication. Has scheduled follow-up appointments but they're not until Wednesday of this coming week. Presents primarily for pain control.

## 2014-08-18 NOTE — Discharge Instructions (Signed)
Please follow up with your primary care doctor and with your orthopedist as instructed on your prior visit.  It is important to have further workup and treatment completed.  Return to the ER for weakness or numbness into your legs, difficulties lifting your foot, loss of bowel or bladder control.   Back Injury Prevention Back injuries can be extremely painful and difficult to heal. After having one back injury, you are much more likely to experience another later on. It is important to learn how to avoid injuring or re-injuring your back. The following tips can help you to prevent a back injury. PHYSICAL FITNESS  Exercise regularly and try to develop good tone in your abdominal muscles. Your abdominal muscles provide a lot of the support needed by your back.  Do aerobic exercises (walking, jogging, biking, swimming) regularly.  Do exercises that increase balance and strength (tai chi, yoga) regularly. This can decrease your risk of falling and injuring your back.  Stretch before and after exercising.  Maintain a healthy weight. The more you weigh, the more stress is placed on your back. For every pound of weight, 10 times that amount of pressure is placed on the back. DIET  Talk to your caregiver about how much calcium and vitamin D you need per day. These nutrients help to prevent weakening of the bones (osteoporosis). Osteoporosis can cause broken (fractured) bones that lead to back pain.  Include good sources of calcium in your diet, such as dairy products, green, leafy vegetables, and products with calcium added (fortified).  Include good sources of vitamin D in your diet, such as milk and foods that are fortified with vitamin D.  Consider taking a nutritional supplement or a multivitamin if needed.  Stop smoking if you smoke. POSTURE  Sit and stand up straight. Avoid leaning forward when you sit or hunching over when you stand.  Choose chairs with good low back (lumbar)  support.  If you work at a desk, sit close to your work so you do not need to lean over. Keep your chin tucked in. Keep your neck drawn back and elbows bent at a right angle. Your arms should look like the letter "L."  Sit high and close to the steering wheel when you drive. Add a lumbar support to your car seat if needed.  Avoid sitting or standing in one position for too long. Take breaks to get up, stretch, and walk around at least once every hour. Take breaks if you are driving for long periods of time.  Sleep on your side with your knees slightly bent, or sleep on your back with a pillow under your knees. Do not sleep on your stomach. LIFTING, TWISTING, AND REACHING  Avoid heavy lifting, especially repetitive lifting. If you must do heavy lifting:  Stretch before lifting.  Work slowly.  Rest between lifts.  Use carts and dollies to move objects when possible.  Make several small trips instead of carrying 1 heavy load.  Ask for help when you need it.  Ask for help when moving big, awkward objects.  Follow these steps when lifting:  Stand with your feet shoulder-width apart.  Get as close to the object as you can. Do not try to pick up heavy objects that are far from your body.  Use handles or lifting straps if they are available.  Bend at your knees. Squat down, but keep your heels off the floor.  Keep your shoulders pulled back, your chin tucked in, and your  back straight.  Lift the object slowly, tightening the muscles in your legs, abdomen, and buttocks. Keep the object as close to the center of your body as possible.  When you put a load down, use these same guidelines in reverse.  Do not:  Lift the object above your waist.  Twist at the waist while lifting or carrying a load. Move your feet if you need to turn, not your waist.  Bend over without bending at your knees.  Avoid reaching over your head, across a table, or for an object on a high surface. OTHER  TIPS  Avoid wet floors and keep sidewalks clear of ice to prevent falls.  Do not sleep on a mattress that is too soft or too hard.  Keep items that are used frequently within easy reach.  Put heavier objects on shelves at waist level and lighter objects on lower or higher shelves.  Find ways to decrease your stress, such as exercise, massage, or relaxation techniques. Stress can build up in your muscles. Tense muscles are more vulnerable to injury.  Seek treatment for depression or anxiety if needed. These conditions can increase your risk of developing back pain. SEEK MEDICAL CARE IF:  You injure your back.  You have questions about diet, exercise, or other ways to prevent back injuries. MAKE SURE YOU:  Understand these instructions.  Will watch your condition.  Will get help right away if you are not doing well or get worse. Document Released: 11/04/2004 Document Revised: 12/20/2011 Document Reviewed: 11/08/2011 [facility] Patient Information 2015 [place], [place]. This information is not intended to replace advice given to you by your health care provider. Make sure you discuss any questions you have with your health care provider.  Back Pain, Adult Low back pain is very common. About 1 in 5 people have back pain.The cause of low back pain is rarely dangerous. The pain often gets better over time.About half of people with a sudden onset of back pain feel better in just 2 weeks. About 8 in 10 people feel better by 6 weeks.  CAUSES Some common causes of back pain include:  Strain of the muscles or ligaments supporting the spine.  Wear and tear (degeneration) of the spinal discs.  Arthritis.  Direct injury to the back. DIAGNOSIS Most of the time, the direct cause of low back pain is not known.However, back pain can be treated effectively even when the exact cause of the pain is unknown.Answering your caregiver's questions about your overall health and symptoms is one of the  most accurate ways to make sure the cause of your pain is not dangerous. If your caregiver needs more information, he or she may order lab work or imaging tests (X-rays or MRIs).However, even if imaging tests show changes in your back, this usually does not require surgery. HOME CARE INSTRUCTIONS For many people, back pain returns.Since low back pain is rarely dangerous, it is often a condition that people can learn to [facility] their own.   Remain active. It is stressful on the back to sit or stand in one place. Do not sit, drive, or stand in one place for more than 30 minutes at a time. Take short walks on level surfaces as soon as pain allows.Try to increase the length of time you walk each day.  Do not stay in bed.Resting more than 1 or 2 days can delay your recovery.  Do not avoid exercise or work.Your body is made to move.It is not dangerous to  be active, even though your back may hurt.Your back will likely heal faster if you return to being active before your pain is gone.  Pay attention to your body when you bend and lift. Many people have less discomfortwhen lifting if they bend their knees, keep the load close to their bodies,and avoid twisting. Often, the most comfortable positions are those that put less stress on your recovering back.  Find a comfortable position to sleep. Use a firm mattress and lie on your side with your knees slightly bent. If you lie on your back, put a pillow under your knees.  Only take over-the-counter or prescription medicines as directed by your caregiver. Over-the-counter medicines to reduce pain and inflammation are often the most helpful.Your caregiver may prescribe muscle relaxant drugs.These medicines help dull your pain so you can more quickly return to your normal activities and healthy exercise.  Put ice on the injured area.  Put ice in a plastic bag.  Place a towel between your skin and the bag.  Leave the ice on for 15-20 minutes,  03-04 times a day for the first 2 to 3 days. After that, ice and heat may be alternated to reduce pain and spasms.  Ask your caregiver about trying back exercises and gentle massage. This may be of some benefit.  Avoid feeling anxious or stressed.Stress increases muscle tension and can worsen back pain.It is important to recognize when you are anxious or stressed and learn ways to manage it.Exercise is a great option. SEEK MEDICAL CARE IF:  You have pain that is not relieved with rest or medicine.  You have pain that does not improve in 1 week.  You have new symptoms.  You are generally not feeling well. SEEK IMMEDIATE MEDICAL CARE IF:   You have pain that radiates from your back into your legs.  You develop new bowel or bladder control problems.  You have unusual weakness or numbness in your arms or legs.  You develop nausea or vomiting.  You develop abdominal pain.  You feel faint. Document Released: 09/27/2005 Document Revised: 03/28/2012 Document Reviewed: 01/29/2014 [facility] Patient Information 2015 [place], [place]. This information is not intended to replace advice given to you by your health care provider. Make sure you discuss any questions you have with your health care provider.

## 2014-08-19 ENCOUNTER — Ambulatory Visit: Payer: Self-pay | Admitting: Family Medicine

## 2014-08-21 ENCOUNTER — Ambulatory Visit: Payer: Self-pay | Admitting: Family Medicine

## 2014-09-09 ENCOUNTER — Emergency Department: Payer: Self-pay | Admitting: Emergency Medicine

## 2014-10-05 ENCOUNTER — Emergency Department (HOSPITAL_COMMUNITY)
Admission: EM | Admit: 2014-10-05 | Discharge: 2014-10-05 | Disposition: A | Discharge status: Home / Self Care | Payer: Worker's Compensation | Attending: Emergency Medicine | Admitting: Emergency Medicine

## 2014-10-05 ENCOUNTER — Encounter (HOSPITAL_COMMUNITY): Payer: Self-pay | Admitting: Family Medicine

## 2014-10-05 DIAGNOSIS — M1990 Unspecified osteoarthritis, unspecified site: Secondary | ICD-10-CM | POA: Insufficient documentation

## 2014-10-05 DIAGNOSIS — M545 Low back pain, unspecified: Secondary | ICD-10-CM

## 2014-10-05 DIAGNOSIS — Z98811 Dental restoration status: Secondary | ICD-10-CM | POA: Insufficient documentation

## 2014-10-05 DIAGNOSIS — Z79899 Other long term (current) drug therapy: Secondary | ICD-10-CM | POA: Insufficient documentation

## 2014-10-05 DIAGNOSIS — Z791 Long term (current) use of non-steroidal anti-inflammatories (NSAID): Secondary | ICD-10-CM | POA: Insufficient documentation

## 2014-10-05 DIAGNOSIS — Z8659 Personal history of other mental and behavioral disorders: Secondary | ICD-10-CM | POA: Insufficient documentation

## 2014-10-05 DIAGNOSIS — Z87442 Personal history of urinary calculi: Secondary | ICD-10-CM | POA: Insufficient documentation

## 2014-10-05 DIAGNOSIS — I10 Essential (primary) hypertension: Secondary | ICD-10-CM | POA: Insufficient documentation

## 2014-10-05 MED ORDER — OXYCODONE-ACETAMINOPHEN 5-325 MG PO TABS
1.0000 | ORAL_TABLET | Freq: Once | ORAL | Status: AC
  Administered 2014-10-05: 1 via ORAL
  Filled 2014-10-05: qty 1

## 2014-10-05 MED ORDER — TRAMADOL HCL 50 MG PO TABS: 50.0000 mg | ORAL_TABLET | Freq: Four times a day (QID) | ORAL | Status: DC | PRN

## 2014-10-05 MED ORDER — METHOCARBAMOL 500 MG PO TABS: 500.0000 mg | ORAL_TABLET | Freq: Two times a day (BID) | ORAL | Status: DC

## 2014-10-05 NOTE — Discharge Instructions (Signed)

## 2014-10-05 NOTE — ED Notes (Signed)
Pt having lower back pain from injury at work back in November. sts he slipped yesterday and fell on a toy.

## 2014-10-05 NOTE — ED Provider Notes (Signed)
CSN: [id number]     Arrival date & time 10/05/14  0747 History   First MD Initiated Contact with Patient 10/05/14 [phone]     Chief Complaint  Patient presents with  . Back Pain     (Consider location/radiation/quality/duration/timing/severity/associated sxs/prior Treatment) HPI Comments: Pt states that he was hurt on the job in November and has had x-ray and mri and DR. Rolena Infante is following him. Pt states that he was for surgery approval from workmens comp. Denies numbness weakness or incontinence. Pt states that he slipped and fell on a toy yesterday and he feels like he agitated thing. Pt states that he is out of his ultram and even though it doesn't work it is better then nothing  The history is provided by the patient. No language interpreter was used.    Past Medical History  Diagnosis Date  . Syncope     resolved- was related to panic attacks  . Full dentures   . Hypertension     Pt states he doen not have HTN and has never been treated for HTN  . Arthritis     hands, elbows bilaterally  . Panic disorder     pt states has resolved  . Kidney stone on left side last stone 4-5 yrs ago    recurrent (3 episodes)   Past Surgical History  Procedure Laterality Date  . Wrist surgery      fx only  . Wisdom tooth extraction    . Open reduction internal fixation (orif) distal radial fracture Left 11/29/2012    Procedure: OPEN REDUCTION INTERNAL FIXATION (ORIF) DISTAL RADIAL FRACTURE;  Surgeon: Schuyler Amor, MD;  Location: [place];  Service: Orthopedics;  Laterality: Left;  LEFT DISTAL RADIUS OSTEOTOMY WITH BONE GRAFT  . Tendon repair Left 02/07/2013    Procedure: LEFT EXTENSOR INDICIS PROPRIUS  TO EXTENSOR POLLICIS LONGUS TENDON TRANSFER;  Surgeon: Schuyler Amor, MD;  Location: [place];  Service: Orthopedics;  Laterality: Left;  . Carpal metacarpal fusion with distal radial bone graft Left 05/30/2013    Procedure: LEFT THUMB METACARPAL JOINT  FUSION;  Surgeon: Schuyler Amor, MD;  Location: [place];  Service: Orthopedics;  Laterality: Left;  . Esophagogastroduodenoscopy N/A 02/12/2014    Procedure: ESOPHAGOGASTRODUODENOSCOPY (EGD);  Surgeon: Jerene Bears, MD;  Location[facility] ENDOSCOPY;  Service: Endoscopy;  Laterality: N/A;  . Foreign body removal N/A 02/12/2014    Procedure: FOREIGN BODY REMOVAL;  Surgeon: Jerene Bears, MD;  Location: [place];  Service: Endoscopy;  Laterality: N/A;   Family History  Problem Relation Age of Onset  . Heart attack Father   . Diabetes Neg Hx   . Hyperlipidemia Neg Hx   . Hypertension Neg Hx    History  Substance Use Topics  . Smoking status: Never Smoker   . Smokeless tobacco: Not on file  . Alcohol Use: No    Review of Systems  All other systems reviewed and are negative.     Allergies  Hydrocodone  Home Medications   Prior to Admission medications   Medication Sig Start Date End Date Taking? Authorizing Provider  diazepam (VALIUM) 5 MG tablet Take 1 tablet (5 mg total) by mouth every 6 (six) hours as needed for anxiety. 08/11/14   Elwyn Lade, PA-C  ibuprofen (ADVIL,MOTRIN) 600 MG tablet Take 1 tablet (600 mg total) by mouth every 6 (six) hours as needed. 07/17/14   Carlisle Cater, PA-C  methocarbamol (ROBAXIN-750) 750 MG  tablet Take 1 tablet (750 mg total) by mouth every 8 (eight) hours as needed for muscle spasms. 08/18/14   Kalman Drape, MD  naproxen (NAPROSYN) 500 MG tablet Take 1 tablet (500 mg total) by mouth 2 (two) times daily. 08/18/14   Kalman Drape, MD  oxyCODONE-acetaminophen (PERCOCET/ROXICET) 5-325 MG per tablet Take 1 tablet by mouth every 6 (six) hours as needed for severe pain. May take 2 tablets PO q 6 hours for severe pain - Do not take with Tylenol as this tablet already contains tylenol 08/11/14   Elwyn Lade, PA-C  promethazine (PHENERGAN) 25 MG tablet Take 1 tablet (25 mg total) by mouth every 6 (six) hours as needed for nausea or  vomiting. 08/11/14   Elwyn Lade, PA-C   BP 119/87 mmHg  Pulse 96  Temp(Src) 97.3 F (36.3 C) (Oral)  Resp 22  Ht $R'5\' 8"'oB$  (1.727 m)  Wt 168 lb (76.204 kg)  BMI 25.55 kg/m2  SpO2 100% Physical Exam  Constitutional: He is oriented to person, place, and time. He appears well-developed.  Cardiovascular: Regular rhythm.   Pulmonary/Chest: Effort normal and breath sounds normal.  Musculoskeletal:  Lumbar spine and paraspinal tenderness. Full rom in all extremities. Equal strength bilaterally  Neurological: He is alert and oriented to person, place, and time. He exhibits normal muscle tone. Coordination normal.  Skin: Skin is warm and dry.  Nursing note and vitals reviewed.   ED Course  Procedures (including critical care time) Labs Review Labs Reviewed - No data to display  Imaging Review No results found.   EKG Interpretation None      MDM   Final diagnoses:  Midline low back pain without sciatica    Pt neurologically intact. Will treat with ultram and robaxin. Discussed chronic pain management is not done thru the er. Pt verbalized understanding    Glendell Docker, NP 10/05/14 [place], MD 10/05/14 [phone]

## 2014-10-07 ENCOUNTER — Emergency Department (HOSPITAL_COMMUNITY)
Admission: EM | Admit: 2014-10-07 | Discharge: 2014-10-07 | Disposition: A | Discharge status: Home / Self Care | Payer: Self-pay | Attending: Emergency Medicine | Admitting: Emergency Medicine

## 2014-10-07 ENCOUNTER — Emergency Department: Payer: Self-pay | Admitting: Emergency Medicine

## 2014-10-07 ENCOUNTER — Emergency Department (HOSPITAL_COMMUNITY): Payer: Self-pay

## 2014-10-07 ENCOUNTER — Encounter (HOSPITAL_COMMUNITY): Payer: Self-pay | Admitting: *Deleted

## 2014-10-07 DIAGNOSIS — S63502A Unspecified sprain of left wrist, initial encounter: Secondary | ICD-10-CM | POA: Insufficient documentation

## 2014-10-07 DIAGNOSIS — Z79899 Other long term (current) drug therapy: Secondary | ICD-10-CM | POA: Insufficient documentation

## 2014-10-07 DIAGNOSIS — Z87442 Personal history of urinary calculi: Secondary | ICD-10-CM | POA: Insufficient documentation

## 2014-10-07 DIAGNOSIS — Z791 Long term (current) use of non-steroidal anti-inflammatories (NSAID): Secondary | ICD-10-CM | POA: Insufficient documentation

## 2014-10-07 DIAGNOSIS — Y998 Other external cause status: Secondary | ICD-10-CM | POA: Insufficient documentation

## 2014-10-07 DIAGNOSIS — F410 Panic disorder [episodic paroxysmal anxiety] without agoraphobia: Secondary | ICD-10-CM | POA: Insufficient documentation

## 2014-10-07 DIAGNOSIS — I10 Essential (primary) hypertension: Secondary | ICD-10-CM | POA: Insufficient documentation

## 2014-10-07 DIAGNOSIS — W1839XA Other fall on same level, initial encounter: Secondary | ICD-10-CM | POA: Insufficient documentation

## 2014-10-07 DIAGNOSIS — Z8719 Personal history of other diseases of the digestive system: Secondary | ICD-10-CM | POA: Insufficient documentation

## 2014-10-07 DIAGNOSIS — Y929 Unspecified place or not applicable: Secondary | ICD-10-CM | POA: Insufficient documentation

## 2014-10-07 DIAGNOSIS — Y9389 Activity, other specified: Secondary | ICD-10-CM | POA: Insufficient documentation

## 2014-10-07 DIAGNOSIS — M1990 Unspecified osteoarthritis, unspecified site: Secondary | ICD-10-CM | POA: Insufficient documentation

## 2014-10-07 MED ORDER — OXYCODONE-ACETAMINOPHEN 5-325 MG PO TABS: 1.0000 | ORAL_TABLET | ORAL | Status: DC | PRN

## 2014-10-07 MED ORDER — OXYCODONE-ACETAMINOPHEN 5-325 MG PO TABS
2.0000 | ORAL_TABLET | Freq: Once | ORAL | Status: AC
  Administered 2014-10-07: 2 via ORAL
  Filled 2014-10-07: qty 2

## 2014-10-07 NOTE — ED Provider Notes (Signed)
CSN: [id number]     Arrival date & time 10/07/14  1136 History  This chart was scribed for non-physician practitioner, Monico Blitz, PA-C, working with Mirna Mires, MD, by Stephania Fragmin, ED Scribe. This patient was seen in room WTR7/WTR7 and the patient's care was started at 1:01 PM.    Chief Complaint  Patient presents with  . Wrist Injury  . thumb injury    The history is provided by the patient. No language interpreter was used.    HPI Comments: Connor Solomon is a 53 y.o. male who presents to the Emergency Department complaining of left wrist pain that began after patient fell and landed on his left thumb with all his weight about 2 days ago. Patient has a history of left wrist surgery done for broken bones. He has tried Ultram for pain control with some relief. Patient states that he discovered Vicodin causes a back rash; he was given Percocet for pain control for a past dental problem instead.   Past Medical History  Diagnosis Date  . Syncope     resolved- was related to panic attacks  . Full dentures   . Hypertension     Pt states he doen not have HTN and has never been treated for HTN  . Arthritis     hands, elbows bilaterally  . Panic disorder     pt states has resolved  . Kidney stone on left side last stone 4-5 yrs ago    recurrent (3 episodes)   Past Surgical History  Procedure Laterality Date  . Wrist surgery      fx only  . Wisdom tooth extraction    . Open reduction internal fixation (orif) distal radial fracture Left 11/29/2012    Procedure: OPEN REDUCTION INTERNAL FIXATION (ORIF) DISTAL RADIAL FRACTURE;  Surgeon: Schuyler Amor, MD;  Location: [place];  Service: Orthopedics;  Laterality: Left;  LEFT DISTAL RADIUS OSTEOTOMY WITH BONE GRAFT  . Tendon repair Left 02/07/2013    Procedure: LEFT EXTENSOR INDICIS PROPRIUS  TO EXTENSOR POLLICIS LONGUS TENDON TRANSFER;  Surgeon: Schuyler Amor, MD;  Location: [place];  Service:  Orthopedics;  Laterality: Left;  . Carpal metacarpal fusion with distal radial bone graft Left 05/30/2013    Procedure: LEFT THUMB METACARPAL JOINT FUSION;  Surgeon: Schuyler Amor, MD;  Location: [place];  Service: Orthopedics;  Laterality: Left;  . Esophagogastroduodenoscopy N/A 02/12/2014    Procedure: ESOPHAGOGASTRODUODENOSCOPY (EGD);  Surgeon: Jerene Bears, MD;  Location: [facility];  Service: Endoscopy;  Laterality: N/A;  . Foreign body removal N/A 02/12/2014    Procedure: FOREIGN BODY REMOVAL;  Surgeon: Jerene Bears, MD;  Location: [place];  Service: Endoscopy;  Laterality: N/A;   Family History  Problem Relation Age of Onset  . Heart attack Father   . Diabetes Neg Hx   . Hyperlipidemia Neg Hx   . Hypertension Neg Hx    History  Substance Use Topics  . Smoking status: Never Smoker   . Smokeless tobacco: Not on file  . Alcohol Use: No    Review of Systems  A complete 10 system review of systems was obtained and all systems are negative except as noted in the HPI and PMH.    Allergies  Hydrocodone  Home Medications   Prior to Admission medications   Medication Sig Start Date End Date Taking? Authorizing Provider  diazepam (VALIUM) 5 MG tablet Take 1 tablet (5 mg total) by  mouth every 6 (six) hours as needed for anxiety. 08/11/14   Elwyn Lade, PA-C  ibuprofen (ADVIL,MOTRIN) 600 MG tablet Take 1 tablet (600 mg total) by mouth every 6 (six) hours as needed. 07/17/14   Carlisle Cater, PA-C  methocarbamol (ROBAXIN) 500 MG tablet Take 1 tablet (500 mg total) by mouth 2 (two) times daily. 10/05/14   Glendell Docker, NP  naproxen (NAPROSYN) 500 MG tablet Take 1 tablet (500 mg total) by mouth 2 (two) times daily. 08/18/14   Kalman Drape, MD  oxyCODONE-acetaminophen (PERCOCET) 5-325 MG per tablet Take 1 tablet by mouth every 4 (four) hours as needed. 10/07/14   Kalifa Cadden, PA-C  promethazine (PHENERGAN) 25 MG tablet Take 1 tablet (25 mg total) by  mouth every 6 (six) hours as needed for nausea or vomiting. 08/11/14   Elwyn Lade, PA-C  traMADol (ULTRAM) 50 MG tablet Take 1 tablet (50 mg total) by mouth every 6 (six) hours as needed. 10/05/14   Glendell Docker, NP   BP 155/75 mmHg  Pulse 93  Temp(Src) 98.1 F (36.7 C) (Oral)  Resp 16  SpO2 99% Physical Exam  Constitutional: He is oriented to person, place, and time. He appears well-developed and well-nourished. No distress.  HENT:  Head: Normocephalic and atraumatic.  Eyes: Conjunctivae and EOM are normal.  Neck: Neck supple. No tracheal deviation present.  Cardiovascular: Normal rate.   Radial pulse 2+.  Pulmonary/Chest: Effort normal. No respiratory distress.  Musculoskeletal:  Reduced ROM to the left thumb. Radial pulse 2+. No significant deformity or focal bony tenderness to palpation.  Neurological: He is alert and oriented to person, place, and time.  Skin: Skin is warm and dry.  Psychiatric: He has a normal mood and affect. His behavior is normal.  Nursing note and vitals reviewed.   ED Course  Procedures (including critical care time)  DIAGNOSTIC STUDIES: Oxygen Saturation is 99% on room air, normal by my interpretation.    COORDINATION OF CARE: 1:02 PM - Discussed treatment plan with pt at bedside which includes RICE, 800 mg Motrin TID, Percocet, and possible f/u with hand surgeon and pt agreed to plan.   Labs Review Labs Reviewed - No data to display  Imaging Review Dg Wrist Complete Left  10/07/2014   CLINICAL DATA:  Ulnar and radial left wrist pain. Fall this weekend. History of wrist and thumb surgery.  EXAM: LEFT WRIST - COMPLETE 3+ VIEW  COMPARISON:  04/10/2014, 01/03/2014  FINDINGS: Volar plate and multi screw fixation traversing a remote distal radius fracture. The hardware is intact and in unchanged alignment. There are no periprosthetic fractures or lucencies. Remote ulnar styloid fracture is unchanged. There is ulna positive variance, stable  from prior. Radiocarpal joint space narrowing, also unchanged. Surgical hardware in the thumb metacarpophalangeal joint, partially included.  IMPRESSION: No acute fracture or dislocation of the left wrist. Intact distal radius hardware. Stable chronic change.   Electronically Signed   By: Jeb Levering M.D.   On: 10/07/2014 12:56   Dg Finger Thumb Left  10/07/2014   CLINICAL DATA:  Status post fall, wrist injury  EXAM: LEFT THUMB 2+V  COMPARISON:  None.  FINDINGS: No acute fracture or dislocation. Prior arthrodesis of the first MCP joint transfixed by 2 fully threaded cannulated screws without failure or complication. No soft tissue abnormality.  IMPRESSION: No acute osseous injury of the left thumb.   Electronically Signed   By: Kathreen Devoid   On: 10/07/2014 12:52  EKG Interpretation None      MDM   Final diagnoses:  Wrist sprain, left, initial encounter   Filed Vitals:   10/07/14 1202  BP: 155/75  Pulse: 93  Temp: 98.1 F (36.7 C)  TempSrc: Oral  Resp: 16  SpO2: 99%    Medications  oxyCODONE-acetaminophen (PERCOCET/ROXICET) 5-325 MG per tablet 2 tablet (2 tablets Oral Given 10/07/14 1326)    Connor Solomon is a pleasant 53 y.o. male presenting with left wrist and thumb pain. Patient has history of remote ORIF to this area. Physical exam with a moderately reduced range of motion to thumb which patient states is chronic, neurovascularly intact. X-rays are negative for acute findings. Will write the patient prescription for 7 Percocet. Patient will be given a wrist splint in the ED.  Evaluation does not show pathology that would require ongoing emergent intervention or inpatient treatment. Pt is hemodynamically stable and mentating appropriately. Discussed findings and plan with patient/guardian, who agrees with care plan. All questions answered. Return precautions discussed and outpatient follow up given.    I personally performed the services described in this  documentation, which was scribed in my presence. The recorded information has been reviewed and is accurate.       Monico Blitz, PA-C 10/07/14 [place], MD 10/08/14 [phone]

## 2014-10-07 NOTE — Discharge Instructions (Signed)
Rest, Ice intermittently (in the first 24-48 hours), Gentle compression with an Ace wrap, and elevate (Limb above the level of the heart)   Take up to $Rem'800mg'iDPk$  of ibuprofen (that is usually 4 over the counter pills)  3 times a day for 5 days. Take with food.  Please follow with your primary care doctor in the next 2 days for a check-up. They must obtain records for further management.   Do not hesitate to return to the Emergency Department for any new, worsening or concerning symptoms.    Joint Sprain A sprain is a tear or stretch in the ligaments that hold a joint together. Severe sprains may need as long as 3-6 weeks of immobilization and/or exercises to heal completely. Sprained joints should be rested and protected. If not, they can become unstable and prone to re-injury. Proper treatment can reduce your pain, shorten the period of disability, and reduce the risk of repeated injuries. TREATMENT   Rest and elevate the injured joint to reduce pain and swelling.  Apply ice packs to the injury for 20-30 minutes every 2-3 hours for the next 2-3 days.  Keep the injury wrapped in a compression bandage or splint as long as the joint is painful or as instructed by your caregiver.  Do not use the injured joint until it is completely healed to prevent re-injury and chronic instability. Follow the instructions of your caregiver.  Long-term sprain management may require exercises and/or treatment by a physical therapist. Taping or special braces may help stabilize the joint until it is completely better. SEEK MEDICAL CARE IF:   You develop increased pain or swelling of the joint.  You develop increasing redness and warmth of the joint.  You develop a fever.  It becomes stiff.  Your hand or foot gets cold or numb. Document Released: 11/04/2004 Document Revised: 12/20/2011 Document Reviewed: 10/14/2008 [facility] Patient Information 2015 [place], [place]. This information is not intended to replace  advice given to you by your health care provider. Make sure you discuss any questions you have with your health care provider.

## 2014-10-07 NOTE — ED Notes (Signed)
Pt reports he was seen  In ED on 12/26 for his back pain, then went home and fell injury left wrist and left thumb. Reports hx of left wrist surgery 2 years ago.pain 10/10. Radial pulse strong. Able to wiggle fingers with pain.

## 2015-05-17 ENCOUNTER — Emergency Department
Admission: EM | Admit: 2015-05-17 | Discharge: 2015-05-17 | Disposition: A | Discharge status: Home / Self Care | Payer: Self-pay | Attending: Emergency Medicine | Admitting: Emergency Medicine

## 2015-05-17 ENCOUNTER — Emergency Department: Payer: Self-pay

## 2015-05-17 DIAGNOSIS — Y9289 Other specified places as the place of occurrence of the external cause: Secondary | ICD-10-CM | POA: Insufficient documentation

## 2015-05-17 DIAGNOSIS — F419 Anxiety disorder, unspecified: Secondary | ICD-10-CM | POA: Insufficient documentation

## 2015-05-17 DIAGNOSIS — S60222A Contusion of left hand, initial encounter: Secondary | ICD-10-CM | POA: Insufficient documentation

## 2015-05-17 DIAGNOSIS — I10 Essential (primary) hypertension: Secondary | ICD-10-CM | POA: Insufficient documentation

## 2015-05-17 DIAGNOSIS — Z79899 Other long term (current) drug therapy: Secondary | ICD-10-CM | POA: Insufficient documentation

## 2015-05-17 DIAGNOSIS — Y990 Civilian activity done for income or pay: Secondary | ICD-10-CM | POA: Insufficient documentation

## 2015-05-17 DIAGNOSIS — W231XXA Caught, crushed, jammed, or pinched between stationary objects, initial encounter: Secondary | ICD-10-CM | POA: Insufficient documentation

## 2015-05-17 DIAGNOSIS — Y9389 Activity, other specified: Secondary | ICD-10-CM | POA: Insufficient documentation

## 2015-05-17 MED ORDER — NAPROXEN 500 MG PO TABS: 500.0000 mg | ORAL_TABLET | Freq: Two times a day (BID) | ORAL | Status: DC

## 2015-05-17 MED ORDER — TRAMADOL HCL 50 MG PO TABS: 50.0000 mg | ORAL_TABLET | Freq: Four times a day (QID) | ORAL | Status: DC | PRN

## 2015-05-17 NOTE — ED Provider Notes (Signed)
[facility] Emergency Department Provider Note  ____________________________________________  Time seen: Approximately 7:52 AM  I have reviewed the triage vital signs and the nursing notes.   HISTORY  Chief Complaint Wrist Pain and Hand Pain    HPI Connor Solomon is a 54 y.o. male patient complaining the left wrist and hand pain for 2 days. Patient stated was a crush incident to his jaw work site.Patient was concerned secondary to having prior surgery hand with internal fixation. Patient state file one of the internal fixation is approaching the skin. Patient denies any loss of sensation or function. Patient is rating his pain as a 7/10. No palliative measures taken for this complaint.   Past Medical History  Diagnosis Date  . Syncope     resolved- was related to panic attacks  . Full dentures   . Hypertension     Pt states he doen not have HTN and has never been treated for HTN  . Arthritis     hands, elbows bilaterally  . Panic disorder     pt states has resolved  . Kidney stone on left side last stone 4-5 yrs ago    recurrent (3 episodes)    Patient Active Problem List   Diagnosis Date Noted  . Injury of middle finger 06/09/2011  . INSOMNIA 11/17/2009  . WRIST PAIN, LEFT 10/14/2009  . PANIC DISORDER 09/22/2009  . HYPERTENSION, BENIGN ESSENTIAL 09/22/2009    Past Surgical History  Procedure Laterality Date  . Wrist surgery      fx only  . Wisdom tooth extraction    . Open reduction internal fixation (orif) distal radial fracture Left 11/29/2012    Procedure: OPEN REDUCTION INTERNAL FIXATION (ORIF) DISTAL RADIAL FRACTURE;  Surgeon: Schuyler Amor, MD;  Location: [place];  Service: Orthopedics;  Laterality: Left;  LEFT DISTAL RADIUS OSTEOTOMY WITH BONE GRAFT  . Tendon repair Left 02/07/2013    Procedure: LEFT EXTENSOR INDICIS PROPRIUS  TO EXTENSOR POLLICIS LONGUS TENDON TRANSFER;  Surgeon: Schuyler Amor, MD;  Location:  [place];  Service: Orthopedics;  Laterality: Left;  . Carpal metacarpal fusion with distal radial bone graft Left 05/30/2013    Procedure: LEFT THUMB METACARPAL JOINT FUSION;  Surgeon: Schuyler Amor, MD;  Location: [place];  Service: Orthopedics;  Laterality: Left;  . Esophagogastroduodenoscopy N/A 02/12/2014    Procedure: ESOPHAGOGASTRODUODENOSCOPY (EGD);  Surgeon: Jerene Bears, MD;  Location: [facility] END[facility]COPY;  Service: Endoscopy;  Laterality: N/A;  . Foreign body removal N/A 02/12/2014    Procedure: FOREIGN BODY REMOVAL;  Surgeon: Jerene Bears, MD;  Location: [place];  Service: Endoscopy;  Laterality: N/A;    Current Outpatient Rx  Name  Route  Sig  Dispense  Refill  . diazepam (VALIUM) 5 MG tablet   Oral   Take 1 tablet (5 mg total) by mouth every 6 (six) hours as needed for anxiety.   20 tablet   0   . ibuprofen (ADVIL,MOTRIN) 600 MG tablet   Oral   Take 1 tablet (600 mg total) by mouth every 6 (six) hours as needed.   20 tablet   0   . methocarbamol (ROBAXIN) 500 MG tablet   Oral   Take 1 tablet (500 mg total) by mouth 2 (two) times daily.   20 tablet   0   . naproxen (NAPROSYN) 500 MG tablet   Oral   Take 1 tablet (500 mg total) by mouth 2 (two)  times daily.   30 tablet   0   . naproxen (NAPROSYN) 500 MG tablet   Oral   Take 1 tablet (500 mg total) by mouth 2 (two) times daily with a meal.   20 tablet   0   . oxyCODONE-acetaminophen (PERCOCET) 5-325 MG per tablet   Oral   Take 1 tablet by mouth every 4 (four) hours as needed.   7 tablet   0   . promethazine (PHENERGAN) 25 MG tablet   Oral   Take 1 tablet (25 mg total) by mouth every 6 (six) hours as needed for nausea or vomiting.   12 tablet   0   . traMADol (ULTRAM) 50 MG tablet   Oral   Take 1 tablet (50 mg total) by mouth every 6 (six) hours as needed.   15 tablet   0   . traMADol (ULTRAM) 50 MG tablet   Oral   Take 1 tablet (50 mg total) by mouth every 6  (six) hours as needed for moderate pain.   12 tablet   0     Allergies Hydrocodone  Family History  Problem Relation Age of Onset  . Heart attack Father   . Diabetes Neg Hx   . Hyperlipidemia Neg Hx   . Hypertension Neg Hx     Social History History  Substance Use Topics  . Smoking status: Never Smoker   . Smokeless tobacco: Not on file  . Alcohol Use: No    Review of Systems Constitutional: No fever/chills Eyes: No visual changes. ENT: No sore throat. Cardiovascular: Denies chest pain. Respiratory: Denies shortness of breath. Gastrointestinal: No abdominal pain.  No nausea, no vomiting.  No diarrhea.  No constipation. Genitourinary: Negative for dysuria. Musculoskeletal: Left hand and wrist pain.  Skin: Negative for rash. Neurological: Negative for headaches, focal weakness or numbness. Psychiatric:Anxiety Endocrine:Hypertension Hematological/Lymphatic: Allergic/Immunilogical: Medication list. 10-point ROS otherwise negative.  ____________________________________________   PHYSICAL EXAM:  VITAL SIGNS: ED Triage Vitals  Enc Vitals Group     BP 05/17/15 0739 160/96 mmHg     Pulse Rate 05/17/15 0739 107     Resp 05/17/15 0739 17     Temp 05/17/15 0739 97.6 F (36.4 C)     Temp Source 05/17/15 0739 Oral     SpO2 05/17/15 0739 96 %     Weight 05/17/15 0739 165 lb (74.844 kg)     Height 05/17/15 0739 $RemoveBefor'5\' 8"'[id number]$  (1.727 m)     Head Cir --      Peak Flow --      Pain Score --      Pain Loc --      Pain Edu? --      Excl. in [place]? --     Constitutional: Alert and oriented. Well appearing and in no acute distress. Eyes: Conjunctivae are normal. PERRL. EOMI. Head: Atraumatic. Nose: No congestion/rhinnorhea. Mouth/Throat: Mucous membranes are moist.  Oropharynx non-erythematous. Neck: No stridor. No cervical spine tenderness to palpation. Hematological/Lymphatic/Immunilogical: No cervical lymphadenopathy. Cardiovascular: Normal rate, regular rhythm. Grossly  normal heart sounds.  Good peripheral circulation. The beta BP. Respiratory: Normal respiratory effort.  No retractions. Lungs CTAB. Gastrointestinal: Soft and nontender. No distention. No abdominal bruits. No CVA tenderness. Musculoskeletal: No obvious deformity to the left hand and wrist. This will scar consistent with patient history. Tender palpation distal radius. Neurovascular intact. Grip strength is decreased with 3 over 5.  Neurologic:  Normal speech and language. No gross focal neurologic deficits are appreciated. No  gait instability. Skin:  Skin is warm, dry and intact. No rash noted. Psychiatric: Mood and affect are normal. Speech and behavior are normal.  ____________________________________________   LABS (all labs ordered are listed, but only abnormal results are displayed)  Labs Reviewed - No data to display ____________________________________________  EKG   ____________________________________________  RADIOLOGY  Patient x-ray shows no obvious deformity of the left hand post operative changes similar to previous studies. Cecilio Asper, personally viewed and evaluated these images as part of my medical decision making.   ____________________________________________   PROCEDURES  Procedure(s) performed: None  Critical Care performed: No  ____________________________________________   INITIAL IMPRESSION / ASSESSMENT AND PLAN / ED COURSE  Pertinent labs & imaging results that were available during my care of the patient were reviewed by me and considered in my medical decision making (see chart for details).  Left hand contusion. Discussed x-ray results with patient. Patient given a prescription Motrin and tramadol for 3-5 days. Patient advised follow-up itching orthopedic his concern is not alleviated. Return to ER if condition worsens. ____________________________________________   FINAL CLINICAL IMPRESSION(S) / ED DIAGNOSES  Final diagnoses:   Hand contusion, left, initial encounter      Sable Feil, PA-C 05/17/15 0840  Orbie Pyo, MD 05/17/15 [phone]

## 2015-05-17 NOTE — ED Notes (Signed)
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 Pt states he has had pain in left wrist and hand since. Pt is able to move fingers and thumb on left hand. Pt states he has had surgery on this same area in the past.

## 2015-05-23 ENCOUNTER — Emergency Department (HOSPITAL_COMMUNITY)
Admission: EM | Admit: 2015-05-23 | Discharge: 2015-05-23 | Disposition: A | Discharge status: Home / Self Care | Payer: Self-pay | Attending: Emergency Medicine | Admitting: Emergency Medicine

## 2015-05-23 ENCOUNTER — Encounter (HOSPITAL_COMMUNITY): Payer: Self-pay | Admitting: *Deleted

## 2015-05-23 DIAGNOSIS — Z87442 Personal history of urinary calculi: Secondary | ICD-10-CM | POA: Insufficient documentation

## 2015-05-23 DIAGNOSIS — Z79899 Other long term (current) drug therapy: Secondary | ICD-10-CM | POA: Insufficient documentation

## 2015-05-23 DIAGNOSIS — G8929 Other chronic pain: Secondary | ICD-10-CM | POA: Insufficient documentation

## 2015-05-23 DIAGNOSIS — I10 Essential (primary) hypertension: Secondary | ICD-10-CM | POA: Insufficient documentation

## 2015-05-23 DIAGNOSIS — M545 Low back pain, unspecified: Secondary | ICD-10-CM

## 2015-05-23 DIAGNOSIS — Z8659 Personal history of other mental and behavioral disorders: Secondary | ICD-10-CM | POA: Insufficient documentation

## 2015-05-23 DIAGNOSIS — M1990 Unspecified osteoarthritis, unspecified site: Secondary | ICD-10-CM | POA: Insufficient documentation

## 2015-05-23 DIAGNOSIS — Z972 Presence of dental prosthetic device (complete) (partial): Secondary | ICD-10-CM | POA: Insufficient documentation

## 2015-05-23 DIAGNOSIS — Z791 Long term (current) use of non-steroidal anti-inflammatories (NSAID): Secondary | ICD-10-CM | POA: Insufficient documentation

## 2015-05-23 MED ORDER — METHOCARBAMOL 500 MG PO TABS
500.0000 mg | ORAL_TABLET | Freq: Once | ORAL | Status: AC
  Administered 2015-05-23: 500 mg via ORAL
  Filled 2015-05-23: qty 1

## 2015-05-23 MED ORDER — OXYCODONE-ACETAMINOPHEN 5-325 MG PO TABS: ORAL_TABLET | ORAL | Status: DC

## 2015-05-23 MED ORDER — OXYCODONE-ACETAMINOPHEN 5-325 MG PO TABS
1.0000 | ORAL_TABLET | Freq: Once | ORAL | Status: AC
  Administered 2015-05-23: 1 via ORAL
  Filled 2015-05-23: qty 1

## 2015-05-23 MED ORDER — METHOCARBAMOL 500 MG PO TABS: 1000.0000 mg | ORAL_TABLET | Freq: Four times a day (QID) | ORAL | Status: DC | PRN

## 2015-05-23 NOTE — ED Provider Notes (Signed)
CSN: [id number]     Arrival date & time 05/23/15  0950 History  This chart was scribed for non-physician practitioner Monico Blitz, PA-C working with Forde Dandy, MD by Zola Button, ED Scribe. This patient was seen in room TR07C/TR07C and the patient's care was started at 10:16 AM.       Chief Complaint  Patient presents with  . Back Pain   HPI HPI Comments: Connor Solomon is a 54 y.o. male with a history of arthritis who presents to the Emergency Department complaining of sudden onset lower back pain that started yesterday as he was changing a tire in his lawnmower. The pain intermittently radiates down his left thigh when moving. He has tried Advil for the pain but without relief. Patient denies fever, chills, and loss of bowel or bladder control. He also denies history of back surgeries, although he was told he will likely need a back surgery. He has an appointment with Dr. Rolena Infante next week.   Past Medical History  Diagnosis Date  . Syncope     resolved- was related to panic attacks  . Full dentures   . Hypertension     Pt states he doen not have HTN and has never been treated for HTN  . Arthritis     hands, elbows bilaterally  . Panic disorder     pt states has resolved  . Kidney stone on left side last stone 4-5 yrs ago    recurrent (3 episodes)   Past Surgical History  Procedure Laterality Date  . Wrist surgery      fx only  . Wisdom tooth extraction    . Open reduction internal fixation (orif) distal radial fracture Left 11/29/2012    Procedure: OPEN REDUCTION INTERNAL FIXATION (ORIF) DISTAL RADIAL FRACTURE;  Surgeon: Schuyler Amor, MD;  Location: [place];  Service: Orthopedics;  Laterality: Left;  LEFT DISTAL RADIUS OSTEOTOMY WITH BONE GRAFT  . Tendon repair Left 02/07/2013    Procedure: LEFT EXTENSOR INDICIS PROPRIUS  TO EXTENSOR POLLICIS LONGUS TENDON TRANSFER;  Surgeon: Schuyler Amor, MD;  Location: [place];  Service:  Orthopedics;  Laterality: Left;  . Carpal metacarpal fusion with distal radial bone graft Left 05/30/2013    Procedure: LEFT THUMB METACARPAL JOINT FUSION;  Surgeon: Schuyler Amor, MD;  Location: [place];  Service: Orthopedics;  Laterality: Left;  . Esophagogastroduodenoscopy N/A 02/12/2014    Procedure: ESOPHAGOGASTRODUODENOSCOPY (EGD);  Surgeon: Jerene Bears, MD;  Location: [facility] ENDOSCOPY;  Service: Endoscopy;  Laterality: N/A;  . Foreign body removal N/A 02/12/2014    Procedure: FOREIGN BODY REMOVAL;  Surgeon: Jerene Bears, MD;  Location: [place];  Service: Endoscopy;  Laterality: N/A;   Family History  Problem Relation Age of Onset  . Heart attack Father   . Diabetes Neg Hx   . Hyperlipidemia Neg Hx   . Hypertension Neg Hx    Social History  Substance Use Topics  . Smoking status: Never Smoker   . Smokeless tobacco: None  . Alcohol Use: No    Review of Systems  10 systems reviewed and found to be negative, except as noted in the HPI.  Allergies  Hydrocodone  Home Medications   Prior to Admission medications   Medication Sig Start Date End Date Taking? Authorizing Provider  diazepam (VALIUM) 5 MG tablet Take 1 tablet (5 mg total) by mouth every 6 (six) hours as needed for anxiety. 08/11/14   Cleatrice Burke,  PA-C  ibuprofen (ADVIL,MOTRIN) 600 MG tablet Take 1 tablet (600 mg total) by mouth every 6 (six) hours as needed. 07/17/14   Carlisle Cater, PA-C  methocarbamol (ROBAXIN) 500 MG tablet Take 1 tablet (500 mg total) by mouth 2 (two) times daily. 10/05/14   Glendell Docker, NP  naproxen (NAPROSYN) 500 MG tablet Take 1 tablet (500 mg total) by mouth 2 (two) times daily. 08/18/14   Linton Flemings, MD  naproxen (NAPROSYN) 500 MG tablet Take 1 tablet (500 mg total) by mouth 2 (two) times daily with a meal. 05/17/15   Sable Feil, PA-C  oxyCODONE-acetaminophen (PERCOCET) 5-325 MG per tablet Take 1 tablet by mouth every 4 (four) hours as needed. 10/07/14   Dreanna Kyllo, PA-C  promethazine (PHENERGAN) 25 MG tablet Take 1 tablet (25 mg total) by mouth every 6 (six) hours as needed for nausea or vomiting. 08/11/14   Cleatrice Burke, PA-C  traMADol (ULTRAM) 50 MG tablet Take 1 tablet (50 mg total) by mouth every 6 (six) hours as needed. 10/05/14   Glendell Docker, NP  traMADol (ULTRAM) 50 MG tablet Take 1 tablet (50 mg total) by mouth every 6 (six) hours as needed for moderate pain. 05/17/15   Sable Feil, PA-C   BP 136/86 mmHg  Pulse 86  Temp(Src) 98.6 F (37 C) (Oral)  Resp 16  Ht $R'5\' 8"'Cq$  (1.727 m)  Wt 165 lb (74.844 kg)  BMI 25.09 kg/m2  SpO2 100% Physical Exam  Constitutional: He appears well-developed and well-nourished.  HENT:  Head: Normocephalic.  Eyes: Conjunctivae are normal.  Neck: Normal range of motion.  Cardiovascular: Normal rate, regular rhythm and intact distal pulses.   Pulmonary/Chest: Effort normal.  Abdominal: Soft. There is no tenderness.  Neurological: He is alert.  No point tenderness to percussion of lumbar spinal processes.  No TTP or paraspinal muscular spasm. Strength is 5 out of 5 to bilateral lower extremities at hip and knee; extensor hallucis longus 5 out of 5. Ankle strength 5 out of 5, no clonus, neurovascularly intact. No saddle anaesthesia. Patellar reflexes are 2+ bilaterally.      Psychiatric: He has a normal mood and affect.  Nursing note and vitals reviewed.   ED Course  Procedures  DIAGNOSTIC STUDIES: Oxygen Saturation is 100% on room air, normal by my interpretation.    COORDINATION OF CARE: 10:19 AM-Discussed treatment plan which includes pain medications and muscle relaxant with patient/guardian at bedside and patient/guardian agreed to plan.    Labs Review Labs Reviewed - No data to display  Imaging Review No results found. I, Monico Blitz, personally reviewed and evaluated these images and lab results as part of my medical decision-making.   EKG Interpretation None      MDM    Final diagnoses:  None    Filed Vitals:   05/23/15 0954 05/23/15 0956  BP: 136/86   Pulse: 86   Temp: 98.6 F (37 C)   TempSrc: Oral   Resp: 16   Height:  $Remove'5\' 8"'JpLagsc$  (1.727 m)  Weight:  165 lb (74.844 kg)  SpO2: 100%     Medications  oxyCODONE-acetaminophen (PERCOCET/ROXICET) 5-325 MG per tablet 1 tablet (1 tablet Oral Given 05/23/15 1027)  methocarbamol (ROBAXIN) tablet 500 mg (500 mg Oral Given 05/23/15 1027)    Connor Solomon is a pleasant 54 y.o. male presenting with  back pain.  No neurological deficits and normal neuro exam.  Patient can walk but states is painful.  No loss of bowel or  bladder control.  No concern for cauda equina.  No fever, night sweats, weight loss, h/o cancer, IVDU.  RICE protocol and pain medicine indicated and discussed with patient.  Evaluation does not show pathology that would require ongoing emergent intervention or inpatient treatment. Pt is hemodynamically stable and mentating appropriately. Discussed findings and plan with patient/guardian, who agrees with care plan. All questions answered. Return precautions discussed and outpatient follow up given.   New Prescriptions   METHOCARBAMOL (ROBAXIN) 500 MG TABLET    Take 2 tablets (1,000 mg total) by mouth 4 (four) times daily as needed (Pain).   OXYCODONE-ACETAMINOPHEN (PERCOCET/ROXICET) 5-325 MG PER TABLET    1 to 2 tabs PO q6hrs  PRN for pain     I personally performed the services described in this documentation, which was scribed in my presence. The recorded information has been reviewed and is accurate.    Monico Blitz, PA-C 05/23/15 [place] Liu, MD 05/23/15 2035

## 2015-05-23 NOTE — ED Notes (Signed)
Pt reports back pain worse on WED. the patient reports he called DR Rolena Infante for appt. And has an appt on WED next week.

## 2015-05-23 NOTE — ED Notes (Signed)
Declined W/C at D/C and was escorted to lobby by RN. 

## 2015-05-23 NOTE — Discharge Instructions (Signed)
Please take ibuprofen $RemoveBefore'400mg'KAAembvVOZUrw$  (this is normally 2 over the counter pills) every 6 hours (take with food to minimze stomach irritation).   Take robaxin and/or percocet for breakthrough pain, do not drink alcohol, drive, care for children or perfom other critical tasks while taking robaxin and/or percocet.  Please follow with your primary care doctor in the next 2 days for a check-up. They must obtain records for further management.   Do not hesitate to return to the Emergency Department for any new, worsening or concerning symptoms.    Back Exercises Back exercises help treat and prevent back injuries. The goal of back exercises is to increase the strength of your abdominal and back muscles and the flexibility of your back. These exercises should be started when you no longer have back pain. Back exercises include:  Pelvic Tilt. Lie on your back with your knees bent. Tilt your pelvis until the lower part of your back is against the floor. Hold this position 5 to 10 sec and repeat 5 to 10 times.  Knee to Chest. Pull first 1 knee up against your chest and hold for 20 to 30 seconds, repeat this with the other knee, and then both knees. This may be done with the other leg straight or bent, whichever feels better.  Sit-Ups or Curl-Ups. Bend your knees 90 degrees. Start with tilting your pelvis, and do a partial, slow sit-up, lifting your trunk only 30 to 45 degrees off the floor. Take at least 2 to 3 seconds for each sit-up. Do not do sit-ups with your knees out straight. If partial sit-ups are difficult, simply do the above but with only tightening your abdominal muscles and holding it as directed.  Hip-Lift. Lie on your back with your knees flexed 90 degrees. Push down with your feet and shoulders as you raise your hips a couple inches off the floor; hold for 10 seconds, repeat 5 to 10 times.  Back arches. Lie on your stomach, propping yourself up on bent elbows. Slowly press on your hands, causing an  arch in your low back. Repeat 3 to 5 times. Any initial stiffness and discomfort should lessen with repetition over time.  Shoulder-Lifts. Lie face down with arms beside your body. Keep hips and torso pressed to floor as you slowly lift your head and shoulders off the floor. Do not overdo your exercises, especially in the beginning. Exercises may cause you some mild back discomfort which lasts for a few minutes; however, if the pain is more severe, or lasts for more than 15 minutes, do not continue exercises until you see your caregiver. Improvement with exercise therapy for back problems is slow.  See your caregivers for assistance with developing a proper back exercise program. Document Released: 11/04/2004 Document Revised: 12/20/2011 Document Reviewed: 07/29/2011 [facility] Patient Information 2015 [place], [place]. This information is not intended to replace advice given to you by your health care provider. Make sure you discuss any questions you have with your health care provider.

## 2015-12-25 ENCOUNTER — Encounter (HOSPITAL_COMMUNITY): Payer: Self-pay | Admitting: *Deleted

## 2015-12-25 ENCOUNTER — Emergency Department (HOSPITAL_COMMUNITY): Payer: Self-pay

## 2015-12-25 ENCOUNTER — Emergency Department (HOSPITAL_COMMUNITY)
Admission: EM | Admit: 2015-12-25 | Discharge: 2015-12-25 | Disposition: A | Discharge status: Home / Self Care | Payer: Self-pay | Attending: Emergency Medicine | Admitting: Emergency Medicine

## 2015-12-25 DIAGNOSIS — Z7982 Long term (current) use of aspirin: Secondary | ICD-10-CM | POA: Insufficient documentation

## 2015-12-25 DIAGNOSIS — X58XXXA Exposure to other specified factors, initial encounter: Secondary | ICD-10-CM | POA: Insufficient documentation

## 2015-12-25 DIAGNOSIS — Y998 Other external cause status: Secondary | ICD-10-CM | POA: Insufficient documentation

## 2015-12-25 DIAGNOSIS — Z79899 Other long term (current) drug therapy: Secondary | ICD-10-CM | POA: Insufficient documentation

## 2015-12-25 DIAGNOSIS — S60031A Contusion of right middle finger without damage to nail, initial encounter: Secondary | ICD-10-CM | POA: Insufficient documentation

## 2015-12-25 DIAGNOSIS — Y9389 Activity, other specified: Secondary | ICD-10-CM | POA: Insufficient documentation

## 2015-12-25 DIAGNOSIS — Z87442 Personal history of urinary calculi: Secondary | ICD-10-CM | POA: Insufficient documentation

## 2015-12-25 DIAGNOSIS — I10 Essential (primary) hypertension: Secondary | ICD-10-CM | POA: Insufficient documentation

## 2015-12-25 DIAGNOSIS — S6000XA Contusion of unspecified finger without damage to nail, initial encounter: Secondary | ICD-10-CM

## 2015-12-25 DIAGNOSIS — Y9289 Other specified places as the place of occurrence of the external cause: Secondary | ICD-10-CM | POA: Insufficient documentation

## 2015-12-25 MED ORDER — TRAMADOL HCL 50 MG PO TABS
50.0000 mg | ORAL_TABLET | Freq: Once | ORAL | Status: AC
  Administered 2015-12-25: 50 mg via ORAL
  Filled 2015-12-25: qty 1

## 2015-12-25 MED ORDER — TRAMADOL HCL 50 MG PO TABS: 50.0000 mg | ORAL_TABLET | Freq: Four times a day (QID) | ORAL | Status: DC | PRN

## 2015-12-25 NOTE — ED Provider Notes (Signed)
CSN: [id number]     Arrival date & time 12/25/15  1017 History   First MD Initiated Contact with Patient 12/25/15 1029     Chief Complaint  Patient presents with  . Finger Injury     (Consider location/radiation/quality/duration/timing/severity/associated sxs/prior Treatment) Patient is a 55 y.o. male presenting with hand pain. The history is provided by the patient. No language interpreter was used.  Hand Pain This is a new problem. The current episode started today. The problem occurs constantly. The problem has been unchanged. Pertinent negatives include no joint swelling. Nothing aggravates the symptoms. He has tried nothing for the symptoms. The treatment provided no relief.  Pt complains of pain in his right 3rd finger.    Past Medical History  Diagnosis Date  . Syncope     resolved- was related to panic attacks  . Full dentures   . Hypertension     Pt states he doen not have HTN and has never been treated for HTN  . Arthritis     hands, elbows bilaterally  . Panic disorder     pt states has resolved  . Kidney stone on left side last stone 4-5 yrs ago    recurrent (3 episodes)   Past Surgical History  Procedure Laterality Date  . Wrist surgery      fx only  . Wisdom tooth extraction    . Open reduction internal fixation (orif) distal radial fracture Left 11/29/2012    Procedure: OPEN REDUCTION INTERNAL FIXATION (ORIF) DISTAL RADIAL FRACTURE;  Surgeon: Schuyler Amor, MD;  Location: [place];  Service: Orthopedics;  Laterality: Left;  LEFT DISTAL RADIUS OSTEOTOMY WITH BONE GRAFT  . Tendon repair Left 02/07/2013    Procedure: LEFT EXTENSOR INDICIS PROPRIUS  TO EXTENSOR POLLICIS LONGUS TENDON TRANSFER;  Surgeon: Schuyler Amor, MD;  Location: [place];  Service: Orthopedics;  Laterality: Left;  . Carpal metacarpal fusion with distal radial bone graft Left 05/30/2013    Procedure: LEFT THUMB METACARPAL JOINT FUSION;  Surgeon: Schuyler Amor, MD;  Location: [place];  Service: Orthopedics;  Laterality: Left;  . Esophagogastroduodenoscopy N/A 02/12/2014    Procedure: ESOPHAGOGASTRODUODENOSCOPY (EGD);  Surgeon: Jerene Bears, MD;  Location: [facility] ENDOSCOPY;  Service: Endoscopy;  Laterality: N/A;  . Foreign body removal N/A 02/12/2014    Procedure: FOREIGN BODY REMOVAL;  Surgeon: Jerene Bears, MD;  Location: [place];  Service: Endoscopy;  Laterality: N/A;   Family History  Problem Relation Age of Onset  . Heart attack Father   . Diabetes Neg Hx   . Hyperlipidemia Neg Hx   . Hypertension Neg Hx    Social History  Substance Use Topics  . Smoking status: Never Smoker   . Smokeless tobacco: None  . Alcohol Use: No    Review of Systems  Musculoskeletal: Negative for joint swelling.  All other systems reviewed and are negative.     Allergies  Hydrocodone  Home Medications   Prior to Admission medications   Medication Sig Start Date End Date Taking? Authorizing Provider  diazepam (VALIUM) 5 MG tablet Take 1 tablet (5 mg total) by mouth every 6 (six) hours as needed for anxiety. 08/11/14   Cleatrice Burke, PA-C  ibuprofen (ADVIL,MOTRIN) 600 MG tablet Take 1 tablet (600 mg total) by mouth every 6 (six) hours as needed. 07/17/14   Carlisle Cater, PA-C  methocarbamol (ROBAXIN) 500 MG tablet Take 2 tablets (1,000 mg total) by mouth 4 (four) times  daily as needed (Pain). 05/23/15   Nicole Pisciotta, PA-C  naproxen (NAPROSYN) 500 MG tablet Take 1 tablet (500 mg total) by mouth 2 (two) times daily. 08/18/14   Linton Flemings, MD  naproxen (NAPROSYN) 500 MG tablet Take 1 tablet (500 mg total) by mouth 2 (two) times daily with a meal. 05/17/15   Sable Feil, PA-C  oxyCODONE-acetaminophen (PERCOCET/ROXICET) 5-325 MG per tablet 1 to 2 tabs PO q6hrs  PRN for pain 05/23/15   Elmyra Ricks Pisciotta, PA-C  promethazine (PHENERGAN) 25 MG tablet Take 1 tablet (25 mg total) by mouth every 6 (six) hours as needed for nausea or vomiting.  08/11/14   Cleatrice Burke, PA-C  traMADol (ULTRAM) 50 MG tablet Take 1 tablet (50 mg total) by mouth every 6 (six) hours as needed. 10/05/14   Glendell Docker, NP  traMADol (ULTRAM) 50 MG tablet Take 1 tablet (50 mg total) by mouth every 6 (six) hours as needed for moderate pain. 05/17/15   Sable Feil, PA-C   BP 126/94 mmHg  Pulse 93  Temp(Src) 97.6 F (36.4 C) (Oral)  Resp 16  Ht $R'5\' 8"'wC$  (1.727 m)  Wt 74.844 kg  BMI 25.09 kg/m2  SpO2 100% Physical Exam  Constitutional: He is oriented to person, place, and time. He appears well-developed and well-nourished.  Musculoskeletal: He exhibits tenderness.  Tender distal right 3rd finger,  Pain with range of motion,  nv and ns intact  Neurological: He is alert and oriented to person, place, and time. He has normal reflexes.  Skin: Skin is warm.  Psychiatric: He has a normal mood and affect.  Vitals reviewed.   ED Course  Procedures (including critical care time) Labs Review Labs Reviewed - No data to display  Imaging Review Dg Finger Middle Right  12/25/2015  CLINICAL DATA:  Injury changing a flat tire, pain at distal third finger EXAM: RIGHT MIDDLE FINGER 2+V COMPARISON:  RIGHT hand radiographs 02/05/2012 FINDINGS: Osseous mineralization normal. Degenerative changes at DIP joint RIGHT middle finger. Radiopaque foreign body at radial and volar soft tissues at distal phalanx, 4 x 1 mm, unchanged. No definite acute fracture, dislocation or bone destruction. 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 No acute fracture dislocation. 4 x 1 mm radiopaque foreign body at volar radial soft tissues, distal phalanx RIGHT middle finger, unchanged Electronically Signed   By: Lavonia Dana M.D.   On: 12/25/2015 11:24   I have personally reviewed and evaluated these images and lab results as part of my medical decision-making.   EKG Interpretation None      MDM   Final diagnoses:  Contusion of finger of right  hand, initial encounter    Tramadol Splint An After Visit Summary was printed and given to the patient.    Hollace Kinnier [place], PA-C 12/25/15 [place], MD 12/25/15 [phone]

## 2015-12-25 NOTE — Discharge Instructions (Signed)

## 2015-12-25 NOTE — ED Notes (Signed)
Pt reports he injured his Rt middle finger at 0500 today while changing his tire. Pt went to work but the pain prevented him working.

## 2016-03-29 ENCOUNTER — Emergency Department
Admission: EM | Admit: 2016-03-29 | Discharge: 2016-03-29 | Disposition: A | Discharge status: Home / Self Care | Payer: Self-pay | Attending: Emergency Medicine | Admitting: Emergency Medicine

## 2016-03-29 ENCOUNTER — Emergency Department: Payer: Self-pay

## 2016-03-29 DIAGNOSIS — I10 Essential (primary) hypertension: Secondary | ICD-10-CM | POA: Insufficient documentation

## 2016-03-29 DIAGNOSIS — M1990 Unspecified osteoarthritis, unspecified site: Secondary | ICD-10-CM | POA: Insufficient documentation

## 2016-03-29 DIAGNOSIS — M25541 Pain in joints of right hand: Secondary | ICD-10-CM | POA: Insufficient documentation

## 2016-03-29 MED ORDER — MELOXICAM 15 MG PO TABS: 15.0000 mg | ORAL_TABLET | Freq: Every day | ORAL | Status: DC

## 2016-03-29 NOTE — ED Provider Notes (Signed)
[facility] Emergency Department Provider Note  ____________________________________________  Time seen: Approximately 9:29 AM  I have reviewed the triage vital signs and the nursing notes.   HISTORY  Chief Complaint Hand Pain    HPI Connor Solomon is a 55 y.o. male, NAD, presents to the emergency department with complaint of right hand pain x 2 weeks. He says the pain began on the right third finger after a work injury where he was using a buffer and his finger was caught and pulled by the machine. The pain now includes the whole hand and all five digits. Pain is described as a constant aching with an occasional sharp shooting pain. At worst pain is 10/10 but at this time he rates pain at 7/10. Has been taking tramadol, ibuprofen and Tylenol Extra Strength 3 times daily to control the pain.  He states the Tylenol worked best to relieve his pain but that nothing took the pain away. Also ices the hand occasionally and says the pain is relieved until his hand warms up again. Admits to intermittent edema that causes his hand to "feel so tight it might burst". Denies erythema, warmth, arthritis, numbness nor parasthesias. Also notes that he has had off-and-on pain in the fingers and hand with weather changes. Has had increasing pain in the mornings when he wakes up but notes the pain decreases to today as he uses his hands.   Past Medical History  Diagnosis Date  . Syncope     resolved- was related to panic attacks  . Full dentures   . Hypertension     Pt states he doen not have HTN and has never been treated for HTN  . Arthritis     hands, elbows bilaterally  . Panic disorder     pt states has resolved  . Kidney stone on left side last stone 4-5 yrs ago    recurrent (3 episodes)    Patient Active Problem List   Diagnosis Date Noted  . Injury of middle finger 06/09/2011  . INSOMNIA 11/17/2009  . WRIST PAIN, LEFT 10/14/2009  . PANIC DISORDER 09/22/2009  .  HYPERTENSION, BENIGN ESSENTIAL 09/22/2009    Past Surgical History  Procedure Laterality Date  . Wrist surgery      fx only  . Wisdom tooth extraction    . Open reduction internal fixation (orif) distal radial fracture Left 11/29/2012    Procedure: OPEN REDUCTION INTERNAL FIXATION (ORIF) DISTAL RADIAL FRACTURE;  Surgeon: Schuyler Amor, MD;  Location: [place];  Service: Orthopedics;  Laterality: Left;  LEFT DISTAL RADIUS OSTEOTOMY WITH BONE GRAFT  . Tendon repair Left 02/07/2013    Procedure: LEFT EXTENSOR INDICIS PROPRIUS  TO EXTENSOR POLLICIS LONGUS TENDON TRANSFER;  Surgeon: Schuyler Amor, MD;  Location: [place];  Service: Orthopedics;  Laterality: Left;  . Carpal metacarpal fusion with distal radial bone graft Left 05/30/2013    Procedure: LEFT THUMB METACARPAL JOINT FUSION;  Surgeon: Schuyler Amor, MD;  Location: [place];  Service: Orthopedics;  Laterality: Left;  . Esophagogastroduodenoscopy N/A 02/12/2014    Procedure: ESOPHAGOGASTRODUODENOSCOPY (EGD);  Surgeon: Jerene Bears, MD;  Location: [facility] ENDOSCOPY;  Service: Endoscopy;  Laterality: N/A;  . Foreign body removal N/A 02/12/2014    Procedure: FOREIGN BODY REMOVAL;  Surgeon: Jerene Bears, MD;  Location: [place];  Service: Endoscopy;  Laterality: N/A;    Current Outpatient Rx  Name  Route  Sig  Dispense  Refill  .  meloxicam (MOBIC) 15 MG tablet   Oral   Take 1 tablet (15 mg total) by mouth daily.   30 tablet   0     Allergies Hydrocodone  Family History  Problem Relation Age of Onset  . Heart attack Father   . Diabetes Neg Hx   . Hyperlipidemia Neg Hx   . Hypertension Neg Hx     Social History Social History  Substance Use Topics  . Smoking status: Never Smoker   . Smokeless tobacco: None  . Alcohol Use: No     Review of Systems  Constitutional: No fever/chills Eyes: No visual changes.  Cardiovascular: No chest pain. Respiratory:  No  shortness of breath.  Musculoskeletal: Positive right hand and finger pain. Negative for back pain.  Skin: Negative for rash, redness, swelling, open wounds, skin sores. Neurological: Negative for headaches, focal weakness or numbness. No tingling 10-point ROS otherwise negative.  ____________________________________________   PHYSICAL EXAM:  VITAL SIGNS: ED Triage Vitals  Enc Vitals Group     BP 03/29/16 0830 152/95 mmHg     Pulse Rate 03/29/16 0830 108     Resp 03/29/16 0830 16     Temp 03/29/16 0830 98 F (36.7 C)     Temp Source 03/29/16 0830 Oral     SpO2 03/29/16 0830 98 %     Weight 03/29/16 0830 165 lb (74.844 kg)     Height 03/29/16 0830 $RemoveBefor'5\' 8"'DPeEPCvOgXNo$  (1.727 m)     Head Cir --      Peak Flow --      Pain Score 03/29/16 0830 7     Pain Loc --      Pain Edu? --      Excl. in [place]? --      Constitutional: Alert and oriented. Well appearing and in no acute distress. Eyes: Conjunctivae are normal.  Head: Atraumatic. Cardiovascular: Normal rate, regular rhythm. Normal S1 and S2.  Good peripheral circulation with 2+ pulses in bilateral upper extremities. Capillary refill is brisk in all digits of the right hand. Respiratory: Normal respiratory effort without tachypnea or retractions. Lungs CTAB with breath sounds noted in all lung fields. Musculoskeletal: Tenderness to palpation over the dorsal aspect of the right hand, MCP, PIP and DIP of all fingers of right hand.  ROM of writs and fingers 2-5 intact bilaterally, ROM of thumb intact on right hand. Decreased ROM of thumb on left hand due to previous injury. DIP of third finger deviated laterally with Heberden's nodes noted.  Wrist strength 5/5 bilaterally. Neurologic:  Normal speech and language. No gross focal neurologic deficits are appreciated. Sensation grossly intact without all digits of the right hand. Skin:  No erythma or edema of hands. Small, superficial laceration to right thumb. Scar from previous surgery on left thumb and  left volar aspect of wrist.  Skin is warm, dry.  Psychiatric: Mood and affect are normal. Speech and behavior are normal. Patient exhibits appropriate insight and judgement.   ____________________________________________   LABS  None ____________________________________________  EKG  None ____________________________________________  RADIOLOGY I have personally viewed and evaluated these images (plain radiographs) as part of my medical decision making, as well as reviewing the written report by the radiologist.  Dg Hand Complete Right  03/29/2016  CLINICAL DATA:  Dislocated third MCP joint at work. Trauma, swelling. EXAM: RIGHT HAND - COMPLETE 3+ VIEW COMPARISON:  12/25/2015 FINDINGS: Degenerative changes in the IP joints, most pronounced in the second and third DIP joints. Small radiopaque foreign  body within the soft tissues overlying the middle finger distal phalanx, stable since prior study. No acute bony abnormality. No fracture, subluxation or dislocation. IMPRESSION: No acute bony abnormality. Stable radiopaque foreign body in the soft tissues at the tip of the right middle finger. Electronically Signed   By: Rolm Baptise M.D.   On: 03/29/2016 09:34    ____________________________________________    PROCEDURES  Procedure(s) performed: None   Medications - No data to display   ____________________________________________   INITIAL IMPRESSION / ASSESSMENT AND PLAN / ED COURSE  Patient's diagnosis is consistent with arthralgia of right hand. Patient will be discharged home with prescriptions for meloxicam to take as directed. Patient is to follow up with one of the local outpatient community clinics to establish care and to recheck if symptoms persist past this treatment course. Patient is given ED precautions to return to the ED for any worsening or new symptoms.    ____________________________________________  FINAL CLINICAL IMPRESSION(S) / ED DIAGNOSES  Final  diagnoses:  Arthralgia of right hand      NEW MEDICATIONS STARTED DURING THIS VISIT:  Discharge Medication List as of 03/29/2016  9:52 AM    START taking these medications   Details  meloxicam (MOBIC) 15 MG tablet Take 1 tablet (15 mg total) by mouth daily., Starting 03/29/2016, Until Discontinued, [place], PA-C 03/29/16 1137  Orbie Pyo, MD 03/29/16 1540

## 2016-03-29 NOTE — ED Notes (Signed)
States he had an injury to right hand about 2 weeks ago  conts to have pain and swelling to right hand and middle finger

## 2016-03-29 NOTE — ED Notes (Signed)
Pt c/o right hand pain for the past 2 weeks.. Denies injury

## 2016-03-29 NOTE — Discharge Instructions (Signed)

## 2016-06-15 ENCOUNTER — Emergency Department (HOSPITAL_COMMUNITY): Payer: Self-pay

## 2016-06-15 ENCOUNTER — Encounter (HOSPITAL_COMMUNITY): Payer: Self-pay | Admitting: *Deleted

## 2016-06-15 ENCOUNTER — Emergency Department (HOSPITAL_COMMUNITY)
Admission: EM | Admit: 2016-06-15 | Discharge: 2016-06-15 | Disposition: A | Discharge status: Home / Self Care | Payer: Self-pay | Attending: Emergency Medicine | Admitting: Emergency Medicine

## 2016-06-15 DIAGNOSIS — Y9389 Activity, other specified: Secondary | ICD-10-CM | POA: Insufficient documentation

## 2016-06-15 DIAGNOSIS — I10 Essential (primary) hypertension: Secondary | ICD-10-CM | POA: Insufficient documentation

## 2016-06-15 DIAGNOSIS — W11XXXA Fall on and from ladder, initial encounter: Secondary | ICD-10-CM | POA: Insufficient documentation

## 2016-06-15 DIAGNOSIS — Z79899 Other long term (current) drug therapy: Secondary | ICD-10-CM | POA: Insufficient documentation

## 2016-06-15 DIAGNOSIS — Y999 Unspecified external cause status: Secondary | ICD-10-CM | POA: Insufficient documentation

## 2016-06-15 DIAGNOSIS — S39012A Strain of muscle, fascia and tendon of lower back, initial encounter: Secondary | ICD-10-CM | POA: Insufficient documentation

## 2016-06-15 DIAGNOSIS — W19XXXA Unspecified fall, initial encounter: Secondary | ICD-10-CM

## 2016-06-15 DIAGNOSIS — Y92009 Unspecified place in unspecified non-institutional (private) residence as the place of occurrence of the external cause: Secondary | ICD-10-CM | POA: Insufficient documentation

## 2016-06-15 MED ORDER — TRAMADOL HCL 50 MG PO TABS: 50.0000 mg | ORAL_TABLET | Freq: Four times a day (QID) | ORAL | 0 refills | Status: DC | PRN

## 2016-06-15 MED ORDER — OXYCODONE-ACETAMINOPHEN 5-325 MG PO TABS
1.0000 | ORAL_TABLET | Freq: Once | ORAL | Status: AC
  Administered 2016-06-15: 1 via ORAL
  Filled 2016-06-15: qty 1

## 2016-06-15 NOTE — ED Triage Notes (Signed)
Pt reports falling off of a ladder approx 10 foot yesterday and landing on his back. Pt denies LOC. Pt states that prior to falling he had some ear pain and dizziness. Pt states that he was trying to get his balance and the ladder fell. Pt reports lower back pain and continued dizziness/ear pain. Pt denies any numbness/tingling. Pt ambulatory in triage.

## 2016-06-15 NOTE — Discharge Instructions (Signed)
It was our pleasure to provide your ER care today - we hope that you feel better.  Take motrin or aleve as need for pain.  You may also take ultram as need for pain - no driving when taking.  Follow up with your primary care doctor in the next 1-2 weeks - also follow up with your doctor closely for the recent dizziness and ringing in ear.   You were given pain medication in the ER - no driving for the next 4 hours.  Return to ER if worse, new symptoms, intractable pain, numbness/weakness, other concern.

## 2016-06-15 NOTE — ED Provider Notes (Signed)
[facility]-EMERGENCY DEPT Provider Note   CSN: [id number] Arrival date & time: 06/15/16  0919     History   Chief Complaint Chief Complaint  Patient presents with  . Fall  . Back Pain    HPI Connor Solomon is a 55 y.o. male.  Patient s/p fall at home yesterday. He was working on ladder, changing flashing, was coming down, at states ladder fell to side, fall from 8-10 feet, landing on buttocks.  No loc. Ambulatory since. C/o low back and tailbone area pain. Constant. Dull. Moderate. Worse w palpation. No radicular pain. No numbness/weakness. No gi or gu c/o. No neck pain. No headache. Denies any other pain or injury. Skin intact. Patient states as was coming down ladder had mild tinnitus in left ear and transient dizziness. States has been experiencing intermittent ringing in left ear w dizziness, for the past 2-3 months. No acute or abrupt change, and no dizziness or tinnitus at all today.         The history is provided by the patient.    Past Medical History:  Diagnosis Date  . Arthritis    hands, elbows bilaterally  . Full dentures   . Hypertension    Pt states he doen not have HTN and has never been treated for HTN  . Kidney stone on left side last stone 4-5 yrs ago   recurrent (3 episodes)  . Panic disorder    pt states has resolved  . Syncope    resolved- was related to panic attacks    Patient Active Problem List   Diagnosis Date Noted  . Injury of middle finger 06/09/2011  . INSOMNIA 11/17/2009  . WRIST PAIN, LEFT 10/14/2009  . PANIC DISORDER 09/22/2009  . HYPERTENSION, BENIGN ESSENTIAL 09/22/2009    Past Surgical History:  Procedure Laterality Date  . CARPAL METACARPAL FUSION WITH DISTAL RADIAL BONE GRAFT Left 05/30/2013   Procedure: LEFT THUMB METACARPAL JOINT FUSION;  Surgeon: [name], MD;  Location: [facility];  Service: Orthopedics;  Laterality: Left;  . ESOPHAGOGASTRODUODENOSCOPY N/A 02/12/2014   Procedure:  ESOPHAGOGASTRODUODENOSCOPY (EGD);  Surgeon: [name], MD;  Location: [facility] ENDOSCOPY;  Service: Endoscopy;  Laterality: N/A;  . FOREIGN BODY REMOVAL N/A 02/12/2014   Procedure: FOREIGN BODY REMOVAL;  Surgeon: [name], MD;  Location: [facility] ENDOSCOPY;  Service: Endoscopy;  Laterality: N/A;  . OPEN REDUCTION INTERNAL FIXATION (ORIF) DISTAL RADIAL FRACTURE Left 11/29/2012   Procedure: OPEN REDUCTION INTERNAL FIXATION (ORIF) DISTAL RADIAL FRACTURE;  Surgeon: [name], MD;  Location: [facility];  Service: Orthopedics;  Laterality: Left;  LEFT DISTAL RADIUS OSTEOTOMY WITH BONE GRAFT  . TENDON REPAIR Left 02/07/2013   Procedure: LEFT EXTENSOR INDICIS PROPRIUS  TO EXTENSOR POLLICIS LONGUS TENDON TRANSFER;  Surgeon: [name], MD;  Location: [facility];  Service: Orthopedics;  Laterality: Left;  . WISDOM TOOTH EXTRACTION    . WRIST SURGERY     fx only       Home Medications    Prior to Admission medications   Medication Sig Start Date End Date Taking? Authorizing Provider  meloxicam (MOBIC) 15 MG tablet Take 1 tablet (15 mg total) by mouth daily. 03/29/16   Jami L Hagler, PA-C    Family History Family History  Problem Relation Age of Onset  . Heart attack Father   . Diabetes Neg Hx   . Hyperlipidemia Neg Hx   . Hypertension Neg Hx     Social  History Social History  Substance Use Topics  . Smoking status: Never Smoker  . Smokeless tobacco: Not on file  . Alcohol use No     Allergies   Hydrocodone   Review of Systems Review of Systems  Constitutional: Negative for fever.  HENT: Negative for sore throat.   Eyes: Negative for redness.  Respiratory: Negative for shortness of breath.   Cardiovascular: Negative for chest pain.  Gastrointestinal: Negative for abdominal pain and vomiting.  Genitourinary: Negative for flank pain.  Musculoskeletal: Positive for back pain. Negative for neck pain.  Skin: Negative for wound.  Neurological:  Negative for headaches.  Hematological: Does not bruise/bleed easily.  Psychiatric/Behavioral: Negative for confusion.     Physical Exam Updated Vital Signs BP 119/90 (BP Location: Left Arm)   Pulse 82   Temp 97.5 F (36.4 C) (Oral)   Resp 16   Ht $R'5\' 8"'ks$  (1.727 m)   Wt 74.8 kg   SpO2 96%   BMI 25.09 kg/m   Physical Exam  Constitutional: He is oriented to person, place, and time. He appears well-developed and well-nourished. No distress.  HENT:  Head: Atraumatic.  Left TM normal. eac clear. No mastoid tenderness.   Eyes: EOM are normal. Pupils are equal, round, and reactive to light.  Neck: Normal range of motion. Neck supple. No tracheal deviation present.  Cardiovascular: Normal rate, regular rhythm, normal heart sounds and intact distal pulses.  Exam reveals no gallop and no friction rub.   No murmur heard. Pulmonary/Chest: Effort normal and breath sounds normal. No accessory muscle usage. No respiratory distress. He exhibits no tenderness.  Abdominal: Soft. Bowel sounds are normal. He exhibits no distension. There is no tenderness.  No abd wall contusion or bruising.   Musculoskeletal: Normal range of motion.  Lower lumbar and coccyx area tenderness, remainder of spine is non tender, aligned, no step off. Good rom bil ext without pain or focal bony tenderness. Distal pulses palp bil.   Neurological: He is alert and oriented to person, place, and time.  Motor intact bil, stre 5/5. sens grossly intact. Steady gait.   Skin: Skin is warm and dry. No rash noted. He is not diaphoretic.  Psychiatric: He has a normal mood and affect.  Nursing note and vitals reviewed.    ED Treatments / Results  Labs (all labs ordered are listed, but only abnormal results are displayed)  EKG  EKG Interpretation None       Radiology Dg Lumbar Spine Complete  Result Date: 06/15/2016 CLINICAL DATA:  Fall from ladder 10 feet onto back with pain, initial encounter EXAM: LUMBAR SPINE -  COMPLETE 4+ VIEW COMPARISON:  08/11/2014 FINDINGS: Five lumbar type vertebral bodies are well visualized. Vertebral body height is well maintained. No compression deformity is seen. Mild osteophytic changes are noted at T12-L1 and L5-S1. Disc space narrowing is also noted at L5-S1. Suggestion of bilateral pars defects are seen at L5 without anterolisthesis. No soft tissue abnormality is seen. IMPRESSION: Degenerative changes as described without acute abnormality. Electronically Signed   By: Inez Catalina M.D.   On: 06/15/2016 11:10   Dg Sacrum/coccyx  Result Date: 06/15/2016 CLINICAL DATA:  Fall yesterday from ladder with buttock pain, initial encounter EXAM: SACRUM AND COCCYX - 2+ VIEW COMPARISON:  None. FINDINGS: The pelvic ring appears intact. The sacral ala are within normal limits. The sacroiliac joints appear unremarkable. No fracture is seen. No soft tissue abnormality is noted. IMPRESSION: No acute abnormality noted. Electronically Signed   By:  Inez Catalina M.D.   On: 06/15/2016 11:11    Procedures Procedures (including critical care time)  Medications Ordered in ED Medications  oxyCODONE-acetaminophen (PERCOCET/ROXICET) 5-325 MG per tablet 1 tablet (not administered)     Initial Impression / Assessment and Plan / ED Course  I have reviewed the triage vital signs and the nursing notes.  Pertinent labs & imaging results that were available during my care of the patient were reviewed by me and considered in my medical decision making (see chart for details).  Clinical Course    Xrays.   Patient has ride, does not have to drive. No meds pta.  Percocet 1 po.  Reviewed nursing notes and prior charts for additional history.   xrays neg acute.  Patient appears stable for d/c.     Final Clinical Impressions(s) / ED Diagnoses   Final diagnoses:  None    New Prescriptions New Prescriptions   No medications on file     Lajean Saver, MD 06/15/16 1140

## 2016-10-05 ENCOUNTER — Encounter (HOSPITAL_COMMUNITY): Payer: Self-pay

## 2016-10-05 ENCOUNTER — Emergency Department (HOSPITAL_COMMUNITY): Payer: Self-pay

## 2016-10-05 ENCOUNTER — Emergency Department (HOSPITAL_COMMUNITY)
Admission: EM | Admit: 2016-10-05 | Discharge: 2016-10-05 | Disposition: A | Discharge status: Home / Self Care | Payer: Self-pay | Attending: Emergency Medicine | Admitting: Emergency Medicine

## 2016-10-05 DIAGNOSIS — Z79899 Other long term (current) drug therapy: Secondary | ICD-10-CM | POA: Insufficient documentation

## 2016-10-05 DIAGNOSIS — Y929 Unspecified place or not applicable: Secondary | ICD-10-CM | POA: Insufficient documentation

## 2016-10-05 DIAGNOSIS — Y999 Unspecified external cause status: Secondary | ICD-10-CM | POA: Insufficient documentation

## 2016-10-05 DIAGNOSIS — X58XXXA Exposure to other specified factors, initial encounter: Secondary | ICD-10-CM | POA: Insufficient documentation

## 2016-10-05 DIAGNOSIS — I10 Essential (primary) hypertension: Secondary | ICD-10-CM | POA: Insufficient documentation

## 2016-10-05 DIAGNOSIS — M7651 Patellar tendinitis, right knee: Secondary | ICD-10-CM | POA: Insufficient documentation

## 2016-10-05 DIAGNOSIS — Y9389 Activity, other specified: Secondary | ICD-10-CM | POA: Insufficient documentation

## 2016-10-05 MED ORDER — BUPIVACAINE HCL (PF) 0.5 % IJ SOLN: 1.8000 mL | Freq: Once | INTRAMUSCULAR | Status: DC

## 2016-10-05 MED ORDER — NAPROXEN 500 MG PO TABS: 500.0000 mg | ORAL_TABLET | Freq: Two times a day (BID) | ORAL | 0 refills | Status: DC

## 2016-10-05 NOTE — Discharge Instructions (Signed)
Ice and elevate her knee. Avoid squatting, running, kneeling on your knees. Take naproxen as prescribed. Follow-up with your family doctor.

## 2016-10-05 NOTE — ED Provider Notes (Signed)
[place] DEPT Provider Note   CSN: [id number] Arrival date & time: 10/05/16  1025 By signing my name below, I, Connor Solomon, attest that this documentation has been prepared under the direction and in the presence of non-physician practitioner, Jeannett Senior, PA-C. Electronically Signed: Dyke Solomon, Scribe. 10/05/2016. 10:43 AM.   History   Chief Complaint Chief Complaint  Patient presents with  . Knee Pain    HPI Connor Solomon is a 55 y.o. male with a hx of arthritis who presents to the Emergency Department complaining of sudden onset, worsening right knee pain onset two nights ago. Pt states he was helping his son work on his furnace when he knelt down on a bolt, causing pain to the area. Pain is exacerbated by bearing weight and with movement. Pt notes associated swelling to the area. He has elevated and rested the knee with minimal relief of pain and swelling. No other treatments tried PTA. He denies any wound to the area. Pt has no other complaints at this time.  The history is provided by the patient. No language interpreter was used.   Past Medical History:  Diagnosis Date  . Arthritis    hands, elbows bilaterally  . Full dentures   . Hypertension    Pt states he doen not have HTN and has never been treated for HTN  . Kidney stone on left side last stone 4-5 yrs ago   recurrent (3 episodes)  . Panic disorder    pt states has resolved  . Syncope    resolved- was related to panic attacks    Patient Active Problem List   Diagnosis Date Noted  . Injury of middle finger 06/09/2011  . INSOMNIA 11/17/2009  . WRIST PAIN, LEFT 10/14/2009  . PANIC DISORDER 09/22/2009  . HYPERTENSION, BENIGN ESSENTIAL 09/22/2009    Past Surgical History:  Procedure Laterality Date  . CARPAL METACARPAL FUSION WITH DISTAL RADIAL BONE GRAFT Left 05/30/2013   Procedure: LEFT THUMB METACARPAL JOINT FUSION;  Surgeon: Schuyler Amor, MD;  Location: [place];   Service: Orthopedics;  Laterality: Left;  . ESOPHAGOGASTRODUODENOSCOPY N/A 02/12/2014   Procedure: ESOPHAGOGASTRODUODENOSCOPY (EGD);  Surgeon: Jerene Bears, MD;  Location: [facility] ENDOSCOPY;  Service: Endoscopy;  Laterality: N/A;  . FOREIGN BODY REMOVAL N/A 02/12/2014   Procedure: FOREIGN BODY REMOVAL;  Surgeon: Jerene Bears, MD;  Location: [place];  Service: Endoscopy;  Laterality: N/A;  . OPEN REDUCTION INTERNAL FIXATION (ORIF) DISTAL RADIAL FRACTURE Left 11/29/2012   Procedure: OPEN REDUCTION INTERNAL FIXATION (ORIF) DISTAL RADIAL FRACTURE;  Surgeon: Schuyler Amor, MD;  Location: [place];  Service: Orthopedics;  Laterality: Left;  LEFT DISTAL RADIUS OSTEOTOMY WITH BONE GRAFT  . TENDON REPAIR Left 02/07/2013   Procedure: LEFT EXTENSOR INDICIS PROPRIUS  TO EXTENSOR POLLICIS LONGUS TENDON TRANSFER;  Surgeon: Schuyler Amor, MD;  Location: [place];  Service: Orthopedics;  Laterality: Left;  . WISDOM TOOTH EXTRACTION    . WRIST SURGERY     fx only       Home Medications    Prior to Admission medications   Medication Sig Start Date End Date Taking? Authorizing Provider  meloxicam (MOBIC) 15 MG tablet Take 1 tablet (15 mg total) by mouth daily. 03/29/16   Jami L Hagler, PA-C  traMADol (ULTRAM) 50 MG tablet Take 1 tablet (50 mg total) by mouth every 6 (six) hours as needed. 06/15/16   Lajean Saver, MD    Family History Family History  Problem Relation Age of Onset  . Heart attack Father   . Diabetes Neg Hx   . Hyperlipidemia Neg Hx   . Hypertension Neg Hx     Social History Social History  Substance Use Topics  . Smoking status: Never Smoker  . Smokeless tobacco: Not on file  . Alcohol use No    Allergies   Hydrocodone   Review of Systems Review of Systems  Constitutional: Negative for chills and fever.  Musculoskeletal: Positive for arthralgias, joint swelling and myalgias. Negative for back pain.  Skin: Negative for wound.    Neurological: Negative for weakness and numbness.   Physical Exam Updated Vital Signs BP 137/90 (BP Location: Left Arm)   Pulse 82   Temp 97.5 F (36.4 C) (Oral)   Resp 16   SpO2 100%   Physical Exam  Constitutional: He is oriented to person, place, and time. He appears well-developed and well-nourished. No distress.  HENT:  Head: Normocephalic and atraumatic.  Eyes: Conjunctivae are normal.  Cardiovascular: Normal rate.   Pulmonary/Chest: Effort normal.  Abdominal: He exhibits no distension.  Musculoskeletal:  Right knee is normal appearing. No erythema, bruising, swelling. Tender to palpation over anterior patella and wear inserts into the tibia tuberosity. Pain with extension against resistance. Full range of motion of the knee. Negative anterior and posterior drawer signs. No laxity with medial lateral stress. Full range of motion of the knee.  Neurological: He is alert and oriented to person, place, and time.  Skin: Skin is warm and dry.  Psychiatric: He has a normal mood and affect.  Nursing note and vitals reviewed.  ED Treatments / Results  DIAGNOSTIC STUDIES:  Oxygen Saturation is 100% on RA, normal by my interpretation.    COORDINATION OF CARE:  10:42 AM Will order DG knee. Discussed treatment plan with pt at bedside and pt agreed to plan.   Labs (all labs ordered are listed, but only abnormal results are displayed) Labs Reviewed - No data to display  EKG  EKG Interpretation None       Radiology No results found.  Procedures Procedures (including critical care time)  Medications Ordered in ED Medications - No data to display   Initial Impression / Assessment and Plan / ED Course  I have reviewed the triage vital signs and the nursing notes.  Pertinent labs & imaging results that were available during my care of the patient were reviewed by me and considered in my medical decision making (see chart for details).  Clinical Course   A shunt with  pain to the anterior right knee. States pain after hitting his anterior knee on something. X-rays are negative. Full range motion of the knee, pain with extension but able to extend, doubt patella tendon rupture. Will discharge home with NSAIDs. Most likely patella tendinitis. No status activity for several weeks, follow-up with family doctor.   Vitals:   10/05/16 1030  BP: 137/90  Pulse: 82  Resp: 16  Temp: 97.5 F (36.4 C)  TempSrc: Oral  SpO2: 100%    Final Clinical Impressions(s) / ED Diagnoses   Final diagnoses:  Patellar tendonitis of right knee    New Prescriptions New Prescriptions   No medications on file   I personally performed the services described in this documentation, which was scribed in my presence. The recorded information has been reviewed and is accurate.    Jeannett Senior, PA-C 10/05/16 1204    Virgel Manifold, MD 10/05/16 1558

## 2016-10-05 NOTE — ED Triage Notes (Signed)
Pt presents for evaluation of R knee pain starting Saturday evening. Pt. Reports he was helping his son work on his furnace when he knelt down on a bolt/screw. Pt. Denies puncture wound to knee, endorses hx of arthritis. States swelling improves with rest. Pt ambulatory on arrival.

## 2017-01-25 ENCOUNTER — Emergency Department (HOSPITAL_COMMUNITY)
Admission: EM | Admit: 2017-01-25 | Discharge: 2017-01-25 | Disposition: A | Discharge status: Home / Self Care | Payer: Self-pay | Attending: Emergency Medicine | Admitting: Emergency Medicine

## 2017-01-25 ENCOUNTER — Emergency Department (HOSPITAL_COMMUNITY): Payer: Self-pay

## 2017-01-25 ENCOUNTER — Encounter (HOSPITAL_COMMUNITY): Payer: Self-pay | Admitting: Emergency Medicine

## 2017-01-25 DIAGNOSIS — Y929 Unspecified place or not applicable: Secondary | ICD-10-CM | POA: Insufficient documentation

## 2017-01-25 DIAGNOSIS — M25561 Pain in right knee: Secondary | ICD-10-CM

## 2017-01-25 DIAGNOSIS — W231XXA Caught, crushed, jammed, or pinched between stationary objects, initial encounter: Secondary | ICD-10-CM | POA: Insufficient documentation

## 2017-01-25 DIAGNOSIS — Y999 Unspecified external cause status: Secondary | ICD-10-CM | POA: Insufficient documentation

## 2017-01-25 DIAGNOSIS — Z79899 Other long term (current) drug therapy: Secondary | ICD-10-CM | POA: Insufficient documentation

## 2017-01-25 DIAGNOSIS — I10 Essential (primary) hypertension: Secondary | ICD-10-CM | POA: Insufficient documentation

## 2017-01-25 DIAGNOSIS — Y9339 Activity, other involving climbing, rappelling and jumping off: Secondary | ICD-10-CM | POA: Insufficient documentation

## 2017-01-25 DIAGNOSIS — M25532 Pain in left wrist: Secondary | ICD-10-CM

## 2017-01-25 DIAGNOSIS — W19XXXA Unspecified fall, initial encounter: Secondary | ICD-10-CM

## 2017-01-25 MED ORDER — IBUPROFEN 600 MG PO TABS: 600.0000 mg | ORAL_TABLET | Freq: Four times a day (QID) | ORAL | 0 refills | Status: DC | PRN

## 2017-01-25 MED ORDER — IBUPROFEN 800 MG PO TABS
800.0000 mg | ORAL_TABLET | Freq: Once | ORAL | Status: AC
  Administered 2017-01-25: 800 mg via ORAL
  Filled 2017-01-25: qty 1

## 2017-01-25 NOTE — Discharge Instructions (Signed)
As discussed, please follow the rice protocol instructions provided. Follow-up in 5-7 days for repeat x-ray of the left wrist. Ibuprofen for pain and swelling. Establish care with a primary care provider as soon as you can. Return to the emergency department if you experience swelling, increased pain, redness, warmth at the joint or any other new concerning symptoms in the meantime.

## 2017-01-25 NOTE — ED Triage Notes (Addendum)
Pt was cutting trees yesterday and got his right knee wedged in between two trees and feel and landed on his left wrist. Pt denies hitting his head or any LOC.

## 2017-01-25 NOTE — ED Notes (Signed)
Pt transported to xray 

## 2017-01-25 NOTE — ED Notes (Signed)
Ortho tech at bedside 

## 2017-01-25 NOTE — ED Notes (Signed)
Pt states he understands instructions. Home stable with steady gait. 

## 2017-01-25 NOTE — ED Provider Notes (Signed)
[facility]-EMERGENCY DEPT Provider Note   CSN: [id number] Arrival date & time: 01/25/17  0746     History   Chief Complaint Chief Complaint  Patient presents with  . Knee Pain  . Wrist Pain    HPI Connor Solomon is a 56 y.o. male presenting with right knee and left wrist pain after a fall yesterday from a tree. Patient reports that his knee got wedged between 2 branches and he fell with his left hand stretched out. He has tried aleve without much relief. He reports a hx of arthritis, previous fracture and surgery of same wrist. He denies head trauma and loss of consciousness. Denies h/a, dizziness, abdominal pain, nausea/vomiting or other symptoms.   HPI  Past Medical History:  Diagnosis Date  . Arthritis    hands, elbows bilaterally  . Full dentures   . Hypertension    Pt states he doen not have HTN and has never been treated for HTN  . Kidney stone on left side last stone 4-5 yrs ago   recurrent (3 episodes)  . Panic disorder    pt states has resolved  . Syncope    resolved- was related to panic attacks    Patient Active Problem List   Diagnosis Date Noted  . Injury of middle finger 06/09/2011  . INSOMNIA 11/17/2009  . WRIST PAIN, LEFT 10/14/2009  . PANIC DISORDER 09/22/2009  . HYPERTENSION, BENIGN ESSENTIAL 09/22/2009    Past Surgical History:  Procedure Laterality Date  . CARPAL METACARPAL FUSION WITH DISTAL RADIAL BONE GRAFT Left 05/30/2013   Procedure: LEFT THUMB METACARPAL JOINT FUSION;  Surgeon: [name], MD;  Location: [facility];  Service: Orthopedics;  Laterality: Left;  . ESOPHAGOGASTRODUODENOSCOPY N/A 02/12/2014   Procedure: ESOPHAGOGASTRODUODENOSCOPY (EGD);  Surgeon: [name], MD;  Location: [facility] ENDOSCOPY;  Service: Endoscopy;  Laterality: N/A;  . FOREIGN BODY REMOVAL N/A 02/12/2014   Procedure: FOREIGN BODY REMOVAL;  Surgeon: [name], MD;  Location: [facility] ENDOSCOPY;  Service: Endoscopy;  Laterality: N/A;  . OPEN REDUCTION INTERNAL  FIXATION (ORIF) DISTAL RADIAL FRACTURE Left 11/29/2012   Procedure: OPEN REDUCTION INTERNAL FIXATION (ORIF) DISTAL RADIAL FRACTURE;  Surgeon: [name], MD;  Location: [facility];  Service: Orthopedics;  Laterality: Left;  LEFT DISTAL RADIUS OSTEOTOMY WITH BONE GRAFT  . TENDON REPAIR Left 02/07/2013   Procedure: LEFT EXTENSOR INDICIS PROPRIUS  TO EXTENSOR POLLICIS LONGUS TENDON TRANSFER;  Surgeon: [name], MD;  Location: [facility];  Service: Orthopedics;  Laterality: Left;  . WISDOM TOOTH EXTRACTION    . WRIST SURGERY     fx only       Home Medications    Prior to Admission medications   Medication Sig Start Date End Date Taking? Authorizing Provider  ibuprofen (ADVIL,MOTRIN) 600 MG tablet Take 1 tablet (600 mg total) by mouth every 6 (six) hours as needed. 01/25/17   Georgiana Shore, PA-C  meloxicam (MOBIC) 15 MG tablet Take 1 tablet (15 mg total) by mouth daily. 03/29/16   Jami L Hagler, PA-C  naproxen (NAPROSYN) 500 MG tablet Take 1 tablet (500 mg total) by mouth 2 (two) times daily. 10/05/16   Tatyana Kirichenko, PA-C  traMADol (ULTRAM) 50 MG tablet Take 1 tablet (50 mg total) by mouth every 6 (six) hours as needed. 06/15/16   Cathren Laine, MD    Family History Family History  Problem Relation Age of Onset  . Heart attack Father   . Diabetes  Neg Hx   . Hyperlipidemia Neg Hx   . Hypertension Neg Hx     Social History Social History  Substance Use Topics  . Smoking status: Never Smoker  . Smokeless tobacco: Not on file  . Alcohol use No     Allergies   Hydrocodone   Review of Systems Review of Systems  Constitutional: Negative for chills and fever.  HENT: Negative for ear pain, facial swelling, nosebleeds and sore throat.   Eyes: Negative for pain, redness and visual disturbance.  Respiratory: Negative for cough, choking, chest tightness, shortness of breath, wheezing and stridor.   Cardiovascular: Negative for chest  pain, palpitations and leg swelling.  Gastrointestinal: Negative for abdominal distention, abdominal pain, nausea and vomiting.  Musculoskeletal: Positive for arthralgias. Negative for back pain, gait problem, joint swelling, myalgias, neck pain and neck stiffness.       Right knee and left wrist pain. worse with movement  Skin: Negative for color change, pallor, rash and wound.  Neurological: Negative for dizziness, tremors, seizures, syncope, facial asymmetry, speech difficulty, weakness, light-headedness, numbness and headaches.     Physical Exam Updated Vital Signs BP (!) 116/91 (BP Location: Right Arm)   Pulse 74   Temp 97.7 F (36.5 C) (Oral)   Resp 16   SpO2 100%   Physical Exam  Constitutional: He appears well-developed and well-nourished. No distress.  Afebrile, nontoxic-appearing, sitting comfortably in chair in no acute distress.  HENT:  Head: Normocephalic and atraumatic.  Eyes: Conjunctivae and EOM are normal.  Neck: Neck supple.  Cardiovascular: Normal rate, regular rhythm, normal heart sounds and intact distal pulses.   No murmur heard. Pulmonary/Chest: Effort normal and breath sounds normal. No respiratory distress. He has no wheezes. He has no rales.  Musculoskeletal: Normal range of motion. He exhibits tenderness. He exhibits no edema or deformity.  Full range of motion of the knee and wrist. No erythema, warmth, swelling noted of either joints. Snuffbox tenderness. Strong and equal grips.  5/5 strength resisted flexion of fingers at all joints. Neurovascularly intact in all extremities. TTP of medial aspect of right knee. Negative anterior drawer  Neurological: He is alert. No sensory deficit.  Skin: Skin is warm and dry. No rash noted. He is not diaphoretic. No erythema. No pallor.  Psychiatric: He has a normal mood and affect.  Nursing note and vitals reviewed.    ED Treatments / Results  Labs (all labs ordered are listed, but only abnormal results are  displayed) Labs Reviewed - No data to display  EKG  EKG Interpretation None       Radiology Dg Wrist Complete Left  Result Date: 01/25/2017 CLINICAL DATA:  Fall. EXAM: LEFT WRIST - COMPLETE 3+ VIEW COMPARISON:  05/17/2015. FINDINGS: Prior ORIF distal radius. Hardware intact. Anatomic alignment. Prior joint effusion first MCP. Hardware intact. Old ulnar styloid fracture. IMPRESSION: Stable postsurgical changes.  No acute abnormality. Electronically Signed   By: Marcello Moores  Register   On: 01/25/2017 08:21   Dg Knee Complete 4 Views Right  Result Date: 01/25/2017 CLINICAL DATA:  Fall. EXAM: RIGHT KNEE - COMPLETE 4+ VIEW COMPARISON:  10/05/2016. FINDINGS: Small knee joint effusion cannot be excluded. No evidence of fracture dislocation. No focal bony abnormality identified. IMPRESSION: Small knee joint effusion cannot be excluded. Exam otherwise unremarkable. No acute bony abnormality. Electronically Signed   By: Marcello Moores  Register   On: 01/25/2017 08:20    Procedures Procedures (including critical care time) SPLINT APPLICATION Date/Time: 2:40 AM Authorized by: Steffanie Dunn  Alroy Dust Consent: Verbal consent obtained. Risks and benefits: risks, benefits and alternatives were discussed Consent given by: patient Splint applied by: orthopedic technician Location details: left wrist Splint type: thumb spica Post-procedure: The splinted body part was neurovascularly unchanged following the procedure. Patient tolerance: Patient tolerated the procedure well with no immediate complications.  SPLINT APPLICATION Date/Time: 9:70 AM Authorized by: Emeline General Consent: Verbal consent obtained. Risks and benefits: risks, benefits and alternatives were discussed Consent given by: patient Splint applied by: nursing Location details: right knee Splint type: knee sleeve Supplies used: knee sleeve Post-procedure: The splinted body part was neurovascularly unchanged following the procedure. Patient  tolerance: Patient tolerated the procedure well with no immediate complications.   Medications Ordered in ED Medications  ibuprofen (ADVIL,MOTRIN) tablet 800 mg (800 mg Oral Given 01/25/17 0915)    Initial Impression / Assessment and Plan / ED Course  I have reviewed the triage vital signs and the nursing notes.  Pertinent labs & imaging results that were available during my care of the patient were reviewed by me and considered in my medical decision making (see chart for details).     Patient presents with right knee and left wrist pain after fall from a tree yesterday. Denies head trauma or loss of consciousness. No other injury or pain. Normal neurological exam. No concern for closed head injury, lung injury, or intraabdominal injury.   Reassuring exam, no swelling noted on the knee or wrist, both joints are stable. Patient did have snuffbox tenderness of the left wrist. ordered thumb spica and will have him follow-up for repeat films in 5-7 days.  Radiology without acute abnormality.  Patient is able to ambulate without difficulty in the ED with a limp favoring the right knee.  Pt is hemodynamically stable, in NAD.  Pain has been managed & pt has no complaints prior to dc.   Patient states that he has crutches at home and will use them.  Discussed conservative management and RiCe protocol. DC home with close follow-up with PCP.  Discussed strict return precautions and advised to return to the emergency department if experiencing any new or worsening symptoms. Instructions were understood and patient agreed with discharge plan.   Final Clinical Impressions(s) / ED Diagnoses   Final diagnoses:  Fall, initial encounter  Left wrist pain  Acute pain of right knee    New Prescriptions New Prescriptions   IBUPROFEN (ADVIL,MOTRIN) 600 MG TABLET    Take 1 tablet (600 mg total) by mouth every 6 (six) hours as needed.     Emeline General, PA-C 01/25/17 2637    Charlesetta Shanks, MD 01/25/17 [phone]

## 2017-01-25 NOTE — Progress Notes (Signed)
Orthopedic Tech Progress Note Patient Details:  Connor Solomon 13-Jan-1961 [id number]  Ortho Devices Type of Ortho Device: Thumb velcro splint, Knee Sleeve Ortho Device/Splint Interventions: Application   [name] 01/25/2017, 9:33 AM

## 2017-05-07 ENCOUNTER — Encounter (HOSPITAL_COMMUNITY): Payer: Self-pay

## 2017-05-07 ENCOUNTER — Emergency Department (HOSPITAL_COMMUNITY)
Admission: EM | Admit: 2017-05-07 | Discharge: 2017-05-07 | Disposition: A | Discharge status: Home / Self Care | Payer: Self-pay | Attending: Emergency Medicine | Admitting: Emergency Medicine

## 2017-05-07 DIAGNOSIS — Z5321 Procedure and treatment not carried out due to patient leaving prior to being seen by health care provider: Secondary | ICD-10-CM | POA: Insufficient documentation

## 2017-05-07 NOTE — ED Triage Notes (Signed)
Per pt, Pt came from home with complaints of right knee pain. Pt reports hx of injury in April that has gotten worse since then. Reports pain with movement.

## 2017-05-07 NOTE — ED Triage Notes (Addendum)
The pt is tired of waiting  He will return tomorrow

## 2017-05-08 ENCOUNTER — Emergency Department (HOSPITAL_COMMUNITY): Admission: EM | Admit: 2017-05-08 | Discharge: 2017-05-08 | Discharge status: Against Medical Advice | Payer: Self-pay

## 2017-05-08 NOTE — ED Notes (Signed)
Bed: WTR5 Expected date:  Expected time:  Means of arrival:  Comments: 

## 2017-05-08 NOTE — ED Notes (Signed)
Pt states he has waited too long for triage and will try at home remedies for knee pain and come back if no relief.

## 2018-05-21 ENCOUNTER — Encounter (HOSPITAL_COMMUNITY): Payer: Self-pay

## 2018-05-21 ENCOUNTER — Emergency Department (HOSPITAL_COMMUNITY)
Admission: EM | Admit: 2018-05-21 | Discharge: 2018-05-21 | Disposition: A | Discharge status: Home / Self Care | Payer: Self-pay | Attending: Emergency Medicine | Admitting: Emergency Medicine

## 2018-05-21 ENCOUNTER — Other Ambulatory Visit: Payer: Self-pay

## 2018-05-21 ENCOUNTER — Emergency Department (HOSPITAL_COMMUNITY): Payer: Self-pay

## 2018-05-21 DIAGNOSIS — M25532 Pain in left wrist: Secondary | ICD-10-CM | POA: Insufficient documentation

## 2018-05-21 DIAGNOSIS — I10 Essential (primary) hypertension: Secondary | ICD-10-CM | POA: Insufficient documentation

## 2018-05-21 DIAGNOSIS — Z79899 Other long term (current) drug therapy: Secondary | ICD-10-CM | POA: Insufficient documentation

## 2018-05-21 DIAGNOSIS — F17210 Nicotine dependence, cigarettes, uncomplicated: Secondary | ICD-10-CM | POA: Insufficient documentation

## 2018-05-21 MED ORDER — HYDROCODONE-ACETAMINOPHEN 5-325 MG PO TABS: 1.0000 | ORAL_TABLET | Freq: Four times a day (QID) | ORAL | 0 refills | Status: AC | PRN

## 2018-05-21 NOTE — ED Provider Notes (Signed)
[facility] EMERGENCY DEPARTMENT Provider Note   CSN: [id number] Arrival date & time: 05/21/18  1049     History   Chief Complaint Chief Complaint  Patient presents with  . Wrist Injury    HPI Connor Solomon is a 57 y.o. male.  HPI   Connor Solomon is a 57yo male with a history of ORIF left distal radius fracture (s/p ORIF, carpal metacarpal fusion 2014 by Dr. Mina Marble), hypertension and panic disorder who presents to the emergency department for evaluation of left wrist injury.  Patient states that he was working on a car a week ago when his left wrist got stuck by one of the axles and his wrist was hyperflexed and rotated inwards.  He has had left radial wrist pain which radiates to the left thumb ever since.  He initially had some swelling, but this has improved.  He has been wearing a brace at home and taking ibuprofen without relief.  He reports pain is worsened with wrist flexion in particular.  States that there is a bump over his wrist which he did not have before which seems to be protruding.  He denies fevers, chills, numbness, weakness or injury elsewhere.  Past Medical History:  Diagnosis Date  . Arthritis    hands, elbows bilaterally  . Full dentures   . Hypertension    Pt states he doen not have HTN and has never been treated for HTN  . Kidney stone on left side last stone 4-5 yrs ago   recurrent (3 episodes)  . Panic disorder    pt states has resolved  . Syncope    resolved- was related to panic attacks    Patient Active Problem List   Diagnosis Date Noted  . Injury of middle finger 06/09/2011  . INSOMNIA 11/17/2009  . WRIST PAIN, LEFT 10/14/2009  . PANIC DISORDER 09/22/2009  . HYPERTENSION, BENIGN ESSENTIAL 09/22/2009    Past Surgical History:  Procedure Laterality Date  . CARPAL METACARPAL FUSION WITH DISTAL RADIAL BONE GRAFT Left 05/30/2013   Procedure: LEFT THUMB METACARPAL JOINT FUSION;  Surgeon: [name], MD;  Location:  [facility];  Service: Orthopedics;  Laterality: Left;  . ESOPHAGOGASTRODUODENOSCOPY N/A 02/12/2014   Procedure: ESOPHAGOGASTRODUODENOSCOPY (EGD);  Surgeon: [name], MD;  Location: [facility] ENDOSCOPY;  Service: Endoscopy;  Laterality: N/A;  . FOREIGN BODY REMOVAL N/A 02/12/2014   Procedure: FOREIGN BODY REMOVAL;  Surgeon: [name], MD;  Location: [facility] ENDOSCOPY;  Service: Endoscopy;  Laterality: N/A;  . OPEN REDUCTION INTERNAL FIXATION (ORIF) DISTAL RADIAL FRACTURE Left 11/29/2012   Procedure: OPEN REDUCTION INTERNAL FIXATION (ORIF) DISTAL RADIAL FRACTURE;  Surgeon: [name], MD;  Location: [facility];  Service: Orthopedics;  Laterality: Left;  LEFT DISTAL RADIUS OSTEOTOMY WITH BONE GRAFT  . TENDON REPAIR Left 02/07/2013   Procedure: LEFT EXTENSOR INDICIS PROPRIUS  TO EXTENSOR POLLICIS LONGUS TENDON TRANSFER;  Surgeon: [name], MD;  Location: [facility];  Service: Orthopedics;  Laterality: Left;  . WISDOM TOOTH EXTRACTION    . WRIST SURGERY     fx only        Home Medications    Prior to Admission medications   Medication Sig Start Date End Date Taking? Authorizing Provider  ibuprofen (ADVIL,MOTRIN) 600 MG tablet Take 1 tablet (600 mg total) by mouth every 6 (six) hours as needed. 01/25/17   Georgiana Shore, PA-C  meloxicam (MOBIC) 15 MG tablet Take 1  tablet (15 mg total) by mouth daily. 03/29/16   Hagler, Jami L, PA-C  naproxen (NAPROSYN) 500 MG tablet Take 1 tablet (500 mg total) by mouth 2 (two) times daily. 10/05/16   Kirichenko, Lahoma Rocker, PA-C  traMADol (ULTRAM) 50 MG tablet Take 1 tablet (50 mg total) by mouth every 6 (six) hours as needed. 06/15/16   Lajean Saver, MD  promethazine (PHENERGAN) 25 MG tablet Take 1 tablet (25 mg total) by mouth every 6 (six) hours as needed for nausea or vomiting. 08/11/14 03/29/16  Cleatrice Burke, PA-C    Family History Family History  Problem Relation Age of Onset  . Heart attack Father     . Diabetes Neg Hx   . Hyperlipidemia Neg Hx   . Hypertension Neg Hx     Social History Social History   Tobacco Use  . Smoking status: Current Some Day Smoker    Types: Cigars  . Smokeless tobacco: Never Used  Substance Use Topics  . Alcohol use: No  . Drug use: No     Allergies   Hydrocodone   Review of Systems Review of Systems  Constitutional: Negative for chills and fever.  Musculoskeletal: Positive for arthralgias (left wrist). Negative for joint swelling.  Skin: Negative for color change.  Neurological: Negative for weakness and numbness.     Physical Exam Updated Vital Signs BP (!) 131/104 (BP Location: Right Arm) Comment: informed Taylor-RN  Pulse 93   Temp 98 F (36.7 C) (Oral)   Resp 18   Ht $R'5\' 8"'nX$  (1.727 m)   Wt 74.8 kg   SpO2 100%   BMI 25.09 kg/m   Physical Exam  Constitutional: He appears well-developed and well-nourished. No distress.  HENT:  Head: Normocephalic and atraumatic.  Eyes: Right eye exhibits no discharge. Left eye exhibits no discharge.  Pulmonary/Chest: Effort normal. No respiratory distress.  Musculoskeletal:  Left wrist with tenderness over the distal radius as well as over the snuff box. No obvious deformity or appreciable swelling. No erythema, warmth or ecchymosis. Full flexion/extension of the wrist, although painful with wrist flexion. Radial pulse 2+ bilaterally. Cap refill <2sec. Sensation to light touch intact in radial, median and ulnar distribution.   Neurological: He is alert. Coordination normal.  Skin: He is not diaphoretic.  Psychiatric: He has a normal mood and affect. His behavior is normal.  Nursing note and vitals reviewed.    ED Treatments / Results  Labs (all labs ordered are listed, but only abnormal results are displayed) Labs Reviewed - No data to display  EKG None  Radiology Dg Wrist Complete Left  Result Date: 05/21/2018 CLINICAL DATA:  Pt reports injuring his left wrist last week while  working on a car, states he twisted it and landed on his wrist. No open wounds, full ROM EXAM: LEFT WRIST - COMPLETE 3+ VIEW COMPARISON:  None. FINDINGS: No acute fracture. Old fracture of the distal radius has been reduced with a volar fixation plate and screws. There is an old ulnar styloid fracture. There has also been a previous fusion of the first metacarpal phalangeal joint with to screws. The orthopedic hardware appears well seated without evidence of loosening. These findings are stable from the prior study. Wrist joints are normally spaced and aligned. Soft tissues are unremarkable. IMPRESSION: 1. No acute fracture or dislocation. 2. Stable changes from previous ORIF of a distal radius fracture and fusion across the first metacarpal phalangeal joint. Electronically Signed   By: Lajean Manes M.D.   On: 05/21/2018  12:15    Procedures Procedures (including critical care time)  Medications Ordered in ED Medications - No data to display   Initial Impression / Assessment and Plan / ED Course  I have reviewed the triage vital signs and the nursing notes.  Pertinent labs & imaging results that were available during my care of the patient were reviewed by me and considered in my medical decision making (see chart for details).     Patient with a history of left radial ORIF surgery and carpal-metacarpal fusion of thumb presents to the ED for evaluation of left wrist injury. On exam, no obvious swelling or deformity.  Hand neurovascularly intact. Xray left wrist negative for acute fracture or abnormality. He has significant snuff box tenderness on exam, concern for potential scaphoid fracture. Patient placed in thumb spica brace and counseled to follow up with his hand surgeon Dr. Burney Gauze for further evaluation. Counseled him on RICE protocol and NSAID use for pain. He is asking for something stronger, will discharge with sort course of norco. I viewed the [place] controlled substance  database prior to prescribing norco and also counseled patient on sedating side effect of this medication. He agrees and voices understanding.   Final Clinical Impressions(s) / ED Diagnoses   Final diagnoses:  Left wrist pain    ED Discharge Orders         Ordered    HYDROcodone-acetaminophen (NORCO/VICODIN) 5-325 MG tablet  Every 6 hours PRN     05/21/18 1311           Bernarda Caffey 05/21/18 1312    Noemi Chapel, MD 05/21/18 1546

## 2018-05-21 NOTE — ED Notes (Signed)
Ortho paged. 

## 2018-05-21 NOTE — Discharge Instructions (Addendum)
Wear wrist brace and follow up with hand specialist Dr. Burney Gauze (phone number listed below.)

## 2018-05-21 NOTE — ED Triage Notes (Signed)
Pt reports injuring his left wrist last week while working on a car, states he twisted it and landed on his wrist. No open wounds, full ROM, +pulses

## 2019-01-16 ENCOUNTER — Other Ambulatory Visit: Payer: Self-pay

## 2019-01-16 ENCOUNTER — Emergency Department (HOSPITAL_COMMUNITY)
Admission: EM | Admit: 2019-01-16 | Discharge: 2019-01-16 | Disposition: A | Discharge status: Home / Self Care | Payer: Self-pay | Attending: Emergency Medicine | Admitting: Emergency Medicine

## 2019-01-16 DIAGNOSIS — B349 Viral infection, unspecified: Secondary | ICD-10-CM | POA: Insufficient documentation

## 2019-01-16 DIAGNOSIS — F17290 Nicotine dependence, other tobacco product, uncomplicated: Secondary | ICD-10-CM | POA: Insufficient documentation

## 2019-01-16 DIAGNOSIS — J029 Acute pharyngitis, unspecified: Secondary | ICD-10-CM | POA: Insufficient documentation

## 2019-01-16 NOTE — ED Triage Notes (Signed)
3 day hx of fever and sore throat.   Denies any cough or SOB.   Here to request work note to return to work.  Works at [employer].

## 2019-01-16 NOTE — ED Notes (Signed)
No changes noted.  Pt ready for DC.

## 2019-01-16 NOTE — ED Provider Notes (Signed)
[place] EMERGENCY DEPARTMENT Provider Note   CSN: [id number] Arrival date & time: 01/16/19  0825    History   Chief Complaint No chief complaint on file.   HPI Connor Solomon is a 58 y.o. male.     HPI   58 year old male presents today with complaints of fever and sore throat.  Patient notes symptoms started 3 days ago with sore throat and fever around 100.5.  He notes that the fever went away yesterday, he notes no medication prior to arrival.  He denies any significant painful swallowing, no chest pain shortness of breath or respiratory complaints.  He notes he occasionally smokes but denies any significant pulmonary history or chronic health conditions.  He was instructed to come to be evaluated for return to work note.  Patient denies any close sick contacts or covert exposures.   Past Medical History:  Diagnosis Date  . Arthritis    hands, elbows bilaterally  . Full dentures   . Hypertension    Pt states he doen not have HTN and has never been treated for HTN  . Kidney stone on left side last stone 4-5 yrs ago   recurrent (3 episodes)  . Panic disorder    pt states has resolved  . Syncope    resolved- was related to panic attacks    Patient Active Problem List   Diagnosis Date Noted  . Injury of middle finger 06/09/2011  . INSOMNIA 11/17/2009  . WRIST PAIN, LEFT 10/14/2009  . PANIC DISORDER 09/22/2009  . HYPERTENSION, BENIGN ESSENTIAL 09/22/2009    Past Surgical History:  Procedure Laterality Date  . CARPAL METACARPAL FUSION WITH DISTAL RADIAL BONE GRAFT Left 05/30/2013   Procedure: LEFT THUMB METACARPAL JOINT FUSION;  Surgeon: Schuyler Amor, MD;  Location: [place];  Service: Orthopedics;  Laterality: Left;  . ESOPHAGOGASTRODUODENOSCOPY N/A 02/12/2014   Procedure: ESOPHAGOGASTRODUODENOSCOPY (EGD);  Surgeon: Jerene Bears, MD;  Location: [facility] ENDOSCOPY;  Service: Endoscopy;  Laterality: N/A;  . FOREIGN BODY REMOVAL N/A  02/12/2014   Procedure: FOREIGN BODY REMOVAL;  Surgeon: Jerene Bears, MD;  Location: [place];  Service: Endoscopy;  Laterality: N/A;  . OPEN REDUCTION INTERNAL FIXATION (ORIF) DISTAL RADIAL FRACTURE Left 11/29/2012   Procedure: OPEN REDUCTION INTERNAL FIXATION (ORIF) DISTAL RADIAL FRACTURE;  Surgeon: Schuyler Amor, MD;  Location: [place];  Service: Orthopedics;  Laterality: Left;  LEFT DISTAL RADIUS OSTEOTOMY WITH BONE GRAFT  . TENDON REPAIR Left 02/07/2013   Procedure: LEFT EXTENSOR INDICIS PROPRIUS  TO EXTENSOR POLLICIS LONGUS TENDON TRANSFER;  Surgeon: Schuyler Amor, MD;  Location: [place];  Service: Orthopedics;  Laterality: Left;  . WISDOM TOOTH EXTRACTION    . WRIST SURGERY     fx only        Home Medications    Prior to Admission medications   Medication Sig Start Date End Date Taking? Authorizing Provider  ibuprofen (ADVIL,MOTRIN) 600 MG tablet Take 1 tablet (600 mg total) by mouth every 6 (six) hours as needed. 01/25/17   Emeline General, PA-C  meloxicam (MOBIC) 15 MG tablet Take 1 tablet (15 mg total) by mouth daily. 03/29/16   Hagler, Jami L, PA-C  naproxen (NAPROSYN) 500 MG tablet Take 1 tablet (500 mg total) by mouth 2 (two) times daily. 10/05/16   Kirichenko, Lahoma Rocker, PA-C  traMADol (ULTRAM) 50 MG tablet Take 1 tablet (50 mg total) by mouth every 6 (six) hours as needed. 06/15/16  Lajean Saver, MD    Family History Family History  Problem Relation Age of Onset  . Heart attack Father   . Diabetes Neg Hx   . Hyperlipidemia Neg Hx   . Hypertension Neg Hx     Social History Social History   Tobacco Use  . Smoking status: Current Some Day Smoker    Types: Cigars  . Smokeless tobacco: Never Used  Substance Use Topics  . Alcohol use: No  . Drug use: No     Allergies   Hydrocodone   Review of Systems Review of Systems  All other systems reviewed and are negative.    Physical Exam Updated Vital Signs BP  112/80 (BP Location: Right Arm)   Pulse 90   Temp 97.7 F (36.5 C) (Oral)   Resp 16   Ht $R'5\' 8"'Ja$  (1.727 m)   Wt 74.8 kg   SpO2 100%   BMI 25.09 kg/m   Physical Exam Vitals signs and nursing note reviewed.  Constitutional:      Appearance: He is well-developed.  HENT:     Head: Normocephalic and atraumatic.     Comments: Oropharynx clear no swelling edema erythema or exudate Eyes:     General: No scleral icterus.       Right eye: No discharge.        Left eye: No discharge.     Conjunctiva/sclera: Conjunctivae normal.     Pupils: Pupils are equal, round, and reactive to light.  Neck:     Musculoskeletal: Normal range of motion.     Vascular: No JVD.     Trachea: No tracheal deviation.  Pulmonary:     Effort: Pulmonary effort is normal. No respiratory distress.     Breath sounds: Normal breath sounds. No stridor. No wheezing, rhonchi or rales.  Neurological:     Mental Status: He is alert and oriented to person, place, and time.     Coordination: Coordination normal.  Psychiatric:        Behavior: Behavior normal.        Thought Content: Thought content normal.        Judgment: Judgment normal.      ED Treatments / Results  Labs (all labs ordered are listed, but only abnormal results are displayed) Labs Reviewed - No data to display  EKG None  Radiology No results found.  Procedures Procedures (including critical care time)  Medications Ordered in ED Medications - No data to display   Initial Impression / Assessment and Plan / ED Course  I have reviewed the triage vital signs and the nursing notes.  Pertinent labs & imaging results that were available during my care of the patient were reviewed by me and considered in my medical decision making (see chart for details).        58 year old male with likely viral URI.  He has no active signs of infection presently.  He is in no acute distress.  Return precautions given.  Verbalized understanding and  agreement to today's plan.  No criteria for COVID-19 testing.  Connor Solomon was evaluated in Emergency Department on 01/16/2019 for the symptoms described in the history of present illness. He was evaluated in the context of the global COVID-19 pandemic, which necessitated consideration that the patient might be at risk for infection with the SARS-CoV-2 virus that causes COVID-19. Institutional protocols and algorithms that pertain to the evaluation of patients at risk for COVID-19 are in a state of rapid change based on  information released by regulatory bodies including the CDC and federal and state organizations. These policies and algorithms were followed during the patient's care in the ED.  Final Clinical Impressions(s) / ED Diagnoses   Final diagnoses:  Viral illness    ED Discharge Orders    None       Francee Gentile 01/16/19 [id number]    Lajean Saver, MD 01/16/19 [id number]

## 2019-01-16 NOTE — Discharge Instructions (Addendum)
Please read the attached information.  Please follow-up immediately if develop any new or worsening signs or symptoms.

## 2020-02-20 ENCOUNTER — Emergency Department (HOSPITAL_COMMUNITY)
Admission: EM | Admit: 2020-02-20 | Discharge: 2020-02-20 | Disposition: A | Discharge status: Home / Self Care | Payer: Self-pay | Attending: Emergency Medicine | Admitting: Emergency Medicine

## 2020-02-20 ENCOUNTER — Encounter (HOSPITAL_COMMUNITY): Payer: Self-pay | Admitting: Emergency Medicine

## 2020-02-20 ENCOUNTER — Other Ambulatory Visit: Payer: Self-pay

## 2020-02-20 DIAGNOSIS — B86 Scabies: Secondary | ICD-10-CM | POA: Insufficient documentation

## 2020-02-20 DIAGNOSIS — F17200 Nicotine dependence, unspecified, uncomplicated: Secondary | ICD-10-CM | POA: Insufficient documentation

## 2020-02-20 DIAGNOSIS — R21 Rash and other nonspecific skin eruption: Secondary | ICD-10-CM

## 2020-02-20 MED ORDER — PERMETHRIN 5 % EX CREA: 1.0000 "application " | TOPICAL_CREAM | Freq: Once | CUTANEOUS | 1 refills | Status: DC

## 2020-02-20 MED ORDER — PREDNISONE 10 MG PO TABS: ORAL_TABLET | ORAL | 0 refills | Status: DC

## 2020-02-20 NOTE — ED Provider Notes (Signed)
[facility] EMERGENCY DEPARTMENT Provider Note   CSN: [id number] Arrival date & time: 02/20/20  3220     History Chief Complaint  Patient presents with  . Rash    Connor Solomon is a 59 y.o. male.  The history is provided by the patient. No language interpreter was used.  Rash Location:  Full body Quality: itchiness and redness   Onset quality:  Gradual Duration:  3 weeks Timing:  Constant Progression:  Worsening Chronicity:  New Context: not plant contact and not sick contacts   Relieved by:  Nothing Worsened by:  Nothing Ineffective treatments:  None tried Associated symptoms: no URI        Past Medical History:  Diagnosis Date  . Arthritis    hands, elbows bilaterally  . Full dentures   . Hypertension    Pt states he doen not have HTN and has never been treated for HTN  . Kidney stone on left side last stone 4-5 yrs ago   recurrent (3 episodes)  . Panic disorder    pt states has resolved  . Syncope    resolved- was related to panic attacks    Patient Active Problem List   Diagnosis Date Noted  . Injury of middle finger 06/09/2011  . INSOMNIA 11/17/2009  . WRIST PAIN, LEFT 10/14/2009  . PANIC DISORDER 09/22/2009  . HYPERTENSION, BENIGN ESSENTIAL 09/22/2009    Past Surgical History:  Procedure Laterality Date  . CARPAL METACARPAL FUSION WITH DISTAL RADIAL BONE GRAFT Left 05/30/2013   Procedure: LEFT THUMB METACARPAL JOINT FUSION;  Surgeon: Schuyler Amor, MD;  Location: [place];  Service: Orthopedics;  Laterality: Left;  . ESOPHAGOGASTRODUODENOSCOPY N/A 02/12/2014   Procedure: ESOPHAGOGASTRODUODENOSCOPY (EGD);  Surgeon: Jerene Bears, MD;  Location: [facility] ENDOSCOPY;  Service: Endoscopy;  Laterality: N/A;  . FOREIGN BODY REMOVAL N/A 02/12/2014   Procedure: FOREIGN BODY REMOVAL;  Surgeon: Jerene Bears, MD;  Location: [place];  Service: Endoscopy;  Laterality: N/A;  . OPEN REDUCTION INTERNAL FIXATION (ORIF) DISTAL  RADIAL FRACTURE Left 11/29/2012   Procedure: OPEN REDUCTION INTERNAL FIXATION (ORIF) DISTAL RADIAL FRACTURE;  Surgeon: Schuyler Amor, MD;  Location: [place];  Service: Orthopedics;  Laterality: Left;  LEFT DISTAL RADIUS OSTEOTOMY WITH BONE GRAFT  . TENDON REPAIR Left 02/07/2013   Procedure: LEFT EXTENSOR INDICIS PROPRIUS  TO EXTENSOR POLLICIS LONGUS TENDON TRANSFER;  Surgeon: Schuyler Amor, MD;  Location: [place];  Service: Orthopedics;  Laterality: Left;  . WISDOM TOOTH EXTRACTION    . WRIST SURGERY     fx only       Family History  Problem Relation Age of Onset  . Heart attack Father   . Diabetes Neg Hx   . Hyperlipidemia Neg Hx   . Hypertension Neg Hx     Social History   Tobacco Use  . Smoking status: Current Some Day Smoker    Types: Cigars  . Smokeless tobacco: Never Used  Substance Use Topics  . Alcohol use: No  . Drug use: No    Home Medications Prior to Admission medications   Medication Sig Start Date End Date Taking? Authorizing Provider  ibuprofen (ADVIL,MOTRIN) 600 MG tablet Take 1 tablet (600 mg total) by mouth every 6 (six) hours as needed. 01/25/17   Emeline General, PA-C  meloxicam (MOBIC) 15 MG tablet Take 1 tablet (15 mg total) by mouth daily. 03/29/16   Hagler, Jami L, PA-C  naproxen (NAPROSYN)  500 MG tablet Take 1 tablet (500 mg total) by mouth 2 (two) times daily. 10/05/16   Kirichenko, Tatyana, PA-C  permethrin (ELIMITE) 5 % cream Apply 1 application topically once for 1 dose. 02/20/20 02/20/20  Fransico Meadow, PA-C  predniSONE (DELTASONE) 10 MG tablet 6,5,4,3,2,1 taper 02/20/20   Fransico Meadow, PA-C  traMADol (ULTRAM) 50 MG tablet Take 1 tablet (50 mg total) by mouth every 6 (six) hours as needed. 06/15/16   Lajean Saver, MD    Allergies    Hydrocodone  Review of Systems   Review of Systems  Skin: Positive for rash.  All other systems reviewed and are negative.   Physical Exam Updated Vital  Signs BP 131/80 (BP Location: Right Arm)   Pulse 91   Temp 97.7 F (36.5 C) (Oral)   Ht $R'5\' 8"'KV$  (1.727 m)   Wt 72.6 kg   SpO2 100%   BMI 24.33 kg/m   Physical Exam Vitals and nursing note reviewed.  Constitutional:      Appearance: He is well-developed.  HENT:     Head: Normocephalic.  Pulmonary:     Effort: Pulmonary effort is normal.  Abdominal:     General: There is no distension.  Musculoskeletal:        General: Normal range of motion.     Cervical back: Normal range of motion.  Skin:    Findings: Rash present.     Comments: Multiple dried areas looks like burrows,    Neurological:     Mental Status: He is alert and oriented to person, place, and time.  Psychiatric:        Mood and Affect: Mood normal.     ED Results / Procedures / Treatments   Labs (all labs ordered are listed, but only abnormal results are displayed) Labs Reviewed - No data to display  EKG None  Radiology No results found.  Procedures Procedures (including critical care time)  Medications Ordered in ED Medications - No data to display  ED Course  I have reviewed the triage vital signs and the nursing notes.  Pertinent labs & imaging results that were available during my care of the patient were reviewed by me and considered in my medical decision making (see chart for details).    MDM Rules/Calculators/A&P                      MDM:  Pt has severe itching, rash appear to be scabies.  I will treat with elemite and prednsione.  Pt given information about scabies  Final Clinical Impression(s) / ED Diagnoses Final diagnoses:  Rash  Scabies    Rx / DC Orders ED Discharge Orders         Ordered    permethrin (ELIMITE) 5 % cream   Once     02/20/20 1037    predniSONE (DELTASONE) 10 MG tablet     02/20/20 1037        An After Visit Summary was printed and given to the patient.    Fransico Meadow, [place] 02/20/20 [place], [place], MD 02/23/20 [phone]

## 2020-02-20 NOTE — ED Triage Notes (Signed)
C/o rash to bilateral legs that has spread to abd x 1 month.  States it itches with intermittent pain.

## 2020-02-20 NOTE — Discharge Instructions (Signed)
Return if any problems.

## 2020-03-24 ENCOUNTER — Other Ambulatory Visit: Payer: Self-pay

## 2020-03-24 ENCOUNTER — Encounter (HOSPITAL_COMMUNITY): Payer: Self-pay

## 2020-03-24 ENCOUNTER — Ambulatory Visit (HOSPITAL_COMMUNITY)
Admission: EM | Admit: 2020-03-24 | Discharge: 2020-03-24 | Disposition: A | Discharge status: Home / Self Care | Payer: Self-pay | Attending: Family Medicine | Admitting: Family Medicine

## 2020-03-24 DIAGNOSIS — B86 Scabies: Secondary | ICD-10-CM

## 2020-03-24 MED ORDER — PERMETHRIN 5 % EX CREA: TOPICAL_CREAM | CUTANEOUS | 1 refills | Status: DC

## 2020-03-24 MED ORDER — HYDROXYZINE HCL 25 MG PO TABS: 25.0000 mg | ORAL_TABLET | ORAL | 0 refills | Status: DC | PRN

## 2020-03-24 MED ORDER — PREDNISONE 10 MG (21) PO TBPK: ORAL_TABLET | Freq: Every day | ORAL | 0 refills | Status: DC

## 2020-03-24 NOTE — Discharge Instructions (Signed)
Use the Elimite (permethrin) cream as directed.  Apply from the neck down, wash off after 10 to 14 hours Clean clothing and linens in hot water, with hot air dry Anything that cannot be washed can be sealed into a plastic bag and left in your garage for couple weeks.  Scabies cannot lift more than 3 days off of the human body It is usually not necessary to fumigate or spray, although some people find it helpful The bites from the scabies will itch for up to 2 weeks.  Do not repeat the permethrin for 2 weeks.  Treat the itching with prednisone as prescribed, and hydroxyzine Read the scabies information printed for you
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
## 2020-03-24 NOTE — ED Triage Notes (Signed)
Patient reports this is his third time having scabies in the last month.

## 2020-05-09 ENCOUNTER — Other Ambulatory Visit: Payer: Self-pay

## 2020-05-09 ENCOUNTER — Ambulatory Visit (HOSPITAL_COMMUNITY)
Admission: EM | Admit: 2020-05-09 | Discharge: 2020-05-09 | Disposition: A | Discharge status: Home / Self Care | Payer: Self-pay | Attending: Emergency Medicine | Admitting: Emergency Medicine

## 2020-05-09 ENCOUNTER — Encounter (HOSPITAL_COMMUNITY): Payer: Self-pay

## 2020-05-09 DIAGNOSIS — L509 Urticaria, unspecified: Secondary | ICD-10-CM

## 2020-05-09 MED ORDER — IVERMECTIN 3 MG PO TABS: 12.0000 mg | ORAL_TABLET | Freq: Once | ORAL | 0 refills | Status: AC

## 2020-05-09 MED ORDER — HYDROXYZINE HCL 25 MG PO TABS: 25.0000 mg | ORAL_TABLET | ORAL | 0 refills | Status: DC | PRN

## 2020-05-09 NOTE — ED Triage Notes (Signed)
Pt here for scabies treatment, 4th time since March

## 2020-05-09 NOTE — Discharge Instructions (Signed)
I would like to try an oral treatment to see if this is more helpful for you.  Take 4 tablets today. May repeat in two weeks if still with symptoms.  Hydroxyzine as needed for itching, primarily before bed. May cause drowsiness. Please do not take if driving or drinking alcohol.   Cool showers only.  Please follow up with dermatology if persistent symptoms.

## 2020-05-10 NOTE — ED Provider Notes (Signed)
[place]    CSN: [id number] Arrival date & time: 05/09/20  5427      History   Chief Complaint Chief Complaint  Patient presents with   Rash    HPI Connor Solomon is a 59 y.o. male.   Connor Solomon presents with complaints of itching rash. This has been intermittent since March. Initially diagnosed with scabies and was treated with topical permethrin. He has treated with permethrin now 5 times since then. He feels like symptoms temporarily dissipate, but then return. His wife has had no rash or similar symptoms. Itching is much worse at night and after showering. His symptoms returned 1 week ago. Primarily behind knees, to thighs, as well as to underarms, waist line and upper arms. He has gotten a new mattress and disposed of bedding, as well as cleaned carpeting etc. Denies any other previous known allergies or allergen exposure. He works as a Furniture conservator/restorer. His hands, feet, and face are not affected. He has not seen dermatology.    ROS per HPI, negative if not otherwise mentioned.      Past Medical History:  Diagnosis Date   Arthritis    hands, elbows bilaterally   Full dentures    Hypertension    Pt states he doen not have HTN and has never been treated for HTN   Kidney stone on left side last stone 4-5 yrs ago   recurrent (3 episodes)   Panic disorder    pt states has resolved   Syncope    resolved- was related to panic attacks    Patient Active Problem List   Diagnosis Date Noted   Injury of middle finger 06/09/2011   INSOMNIA 11/17/2009   WRIST PAIN, LEFT 10/14/2009   PANIC DISORDER 09/22/2009   HYPERTENSION, BENIGN ESSENTIAL 09/22/2009    Past Surgical History:  Procedure Laterality Date   CARPAL METACARPAL FUSION WITH DISTAL RADIAL BONE GRAFT Left 05/30/2013   Procedure: LEFT THUMB METACARPAL JOINT FUSION;  Surgeon: Schuyler Amor, MD;  Location: [place];  Service: Orthopedics;  Laterality: Left;    ESOPHAGOGASTRODUODENOSCOPY N/A 02/12/2014   Procedure: ESOPHAGOGASTRODUODENOSCOPY (EGD);  Surgeon: Jerene Bears, MD;  Location: [facility] ENDOSCOPY;  Service: Endoscopy;  Laterality: N/A;   FOREIGN BODY REMOVAL N/A 02/12/2014   Procedure: FOREIGN BODY REMOVAL;  Surgeon: Jerene Bears, MD;  Location: [place];  Service: Endoscopy;  Laterality: N/A;   OPEN REDUCTION INTERNAL FIXATION (ORIF) DISTAL RADIAL FRACTURE Left 11/29/2012   Procedure: OPEN REDUCTION INTERNAL FIXATION (ORIF) DISTAL RADIAL FRACTURE;  Surgeon: Schuyler Amor, MD;  Location: [place];  Service: Orthopedics;  Laterality: Left;  LEFT DISTAL RADIUS OSTEOTOMY WITH BONE GRAFT   TENDON REPAIR Left 02/07/2013   Procedure: LEFT EXTENSOR INDICIS PROPRIUS  TO EXTENSOR POLLICIS LONGUS TENDON TRANSFER;  Surgeon: Schuyler Amor, MD;  Location: [place];  Service: Orthopedics;  Laterality: Left;   WISDOM TOOTH EXTRACTION     WRIST SURGERY     fx only       Home Medications    Prior to Admission medications   Medication Sig Start Date End Date Taking? Authorizing Provider  hydrOXYzine (ATARAX/VISTARIL) 25 MG tablet Take 1-2 tablets (25-50 mg total) by mouth every 4 (four) hours as needed. 05/09/20   Zigmund Gottron, NP  ibuprofen (ADVIL,MOTRIN) 600 MG tablet Take 1 tablet (600 mg total) by mouth every 6 (six) hours as needed. 01/25/17   Avie Echevaria B, PA-C  meloxicam (  MOBIC) 15 MG tablet Take 1 tablet (15 mg total) by mouth daily. 03/29/16   Hagler, Judithe Modest, PA-C    Family History Family History  Problem Relation Age of Onset   Heart attack Father    Diabetes Neg Hx    Hyperlipidemia Neg Hx    Hypertension Neg Hx     Social History Social History   Tobacco Use   Smoking status: Current Some Day Smoker    Types: Cigars   Smokeless tobacco: Never Used  Substance Use Topics   Alcohol use: No   Drug use: No     Allergies   Hydrocodone   Review of Systems Review of  Systems   Physical Exam Triage Vital Signs ED Triage Vitals [05/09/20 1016]  Enc Vitals Group     BP (!) 157/84     Pulse Rate 91     Resp 16     Temp 98 F (36.7 C)     Temp src      SpO2 100 %     Weight      Height      Head Circumference      Peak Flow      Pain Score 0     Pain Loc      Pain Edu?      Excl. in [place]?    No data found.  Updated Vital Signs BP (!) 157/84    Pulse 91    Temp 98 F (36.7 C)    Resp 16    SpO2 100%   Visual Acuity Right Eye Distance:   Left Eye Distance:   Bilateral Distance:    Right Eye Near:   Left Eye Near:    Bilateral Near:     Physical Exam Constitutional:      Appearance: He is well-developed.  Cardiovascular:     Rate and Rhythm: Normal rate.  Pulmonary:     Effort: Pulmonary effort is normal.  Skin:    General: Skin is warm and dry.     Comments: Excoriation noted to thighs, behind knees and shins, primarily. No lesions noted to trunk. Some scabbing but does also look to be in pattern from scratching no rash to inter digits of fingers or to wrists. No other hives, surrounding redness or vesicles present   Neurological:     Mental Status: He is alert and oriented to person, place, and time.      UC Treatments / Results  Labs (all labs ordered are listed, but only abnormal results are displayed) Labs Reviewed - No data to display  EKG   Radiology No results found.  Procedures Procedures (including critical care time)  Medications Ordered in UC Medications - No data to display  Initial Impression / Assessment and Plan / UC Course  I have reviewed the triage vital signs and the nursing notes.  Pertinent labs & imaging results that were available during my care of the patient were reviewed by me and considered in my medical decision making (see chart for details).     Recurrent scabies? Rash today is not necessarily obvious for scabies as does appear to primarily be related to scratching. Itching skin is  quite apparent. Oral ivermectin provided today and encouraged follow up with dermatology as further evaluation may be warranted as this has been ongoing since March.  Patient verbalized understanding and agreeable to plan.   Final Clinical Impressions(s) / UC Diagnoses   Final diagnoses:  Urticaria  Discharge Instructions     I would like to try an oral treatment to see if this is more helpful for you.  Take 4 tablets today. May repeat in two weeks if still with symptoms.  Hydroxyzine as needed for itching, primarily before bed. May cause drowsiness. Please do not take if driving or drinking alcohol.   Cool showers only.  Please follow up with dermatology if persistent symptoms.    ED Prescriptions    Medication Sig Dispense Auth. Provider   hydrOXYzine (ATARAX/VISTARIL) 25 MG tablet Take 1-2 tablets (25-50 mg total) by mouth every 4 (four) hours as needed. 20 tablet Augusto Gamble B, NP   ivermectin (STROMECTOL) 3 MG TABS tablet Take 4 tablets (12 mg total) by mouth once for 1 dose. Take 4 tablets today. May repeat in two weeks if needed. 8 tablet Zigmund Gottron, NP     PDMP not reviewed this encounter.   Zigmund Gottron, NP 05/10/20 1246

## 2020-05-30 ENCOUNTER — Emergency Department (HOSPITAL_COMMUNITY): Payer: Self-pay

## 2020-05-30 ENCOUNTER — Encounter (HOSPITAL_COMMUNITY): Payer: Self-pay

## 2020-05-30 ENCOUNTER — Emergency Department (HOSPITAL_COMMUNITY)
Admission: EM | Admit: 2020-05-30 | Discharge: 2020-05-31 | Disposition: A | Discharge status: Home / Self Care | Payer: Self-pay | Attending: Emergency Medicine | Admitting: Emergency Medicine

## 2020-05-30 DIAGNOSIS — M25511 Pain in right shoulder: Secondary | ICD-10-CM | POA: Insufficient documentation

## 2020-05-30 DIAGNOSIS — S42201A Unspecified fracture of upper end of right humerus, initial encounter for closed fracture: Secondary | ICD-10-CM

## 2020-05-30 DIAGNOSIS — R911 Solitary pulmonary nodule: Secondary | ICD-10-CM
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
## 2020-05-31 ENCOUNTER — Emergency Department (HOSPITAL_COMMUNITY): Payer: Self-pay

## 2020-05-31 LAB — COMPREHENSIVE METABOLIC PANEL
ALT: 11 U/L (ref 0–44)
AST: 16 U/L (ref 15–41)
Albumin: 3.1 g/dL — ABNORMAL LOW (ref 3.5–5.0)
Alkaline Phosphatase: 80 U/L (ref 38–126)
Anion gap: 10 (ref 5–15)
BUN: 8 mg/dL (ref 6–20)
CO2: 24 mmol/L (ref 22–32)
Calcium: 9.2 mg/dL (ref 8.9–10.3)
Chloride: 102 mmol/L (ref 98–111)
Creatinine, Ser: 0.78 mg/dL (ref 0.61–1.24)
GFR calc Af Amer: >60 mL/min (ref >60)
GFR calc non Af Amer: >60 mL/min (ref >60)
Glucose, Bld: 108 mg/dL — ABNORMAL HIGH (ref 70–99)
Potassium: 3.9 mmol/L (ref 3.5–5.1)
Sodium: 136 mmol/L (ref 135–145)
Total Bilirubin: 0.4 mg/dL (ref 0.3–1.2)
Total Protein: 7.6 g/dL (ref 6.5–8.1)

## 2020-05-31 LAB — RAPID URINE DRUG SCREEN, HOSP PERFORMED
Amphetamines: POSITIVE — AB
Barbiturates: NOT DETECTED
Benzodiazepines: POSITIVE — AB
Cocaine: NOT DETECTED
Opiates: NOT DETECTED
Tetrahydrocannabinol: NOT DETECTED

## 2020-05-31 LAB — CBC WITH DIFFERENTIAL/PLATELET
Abs Immature Granulocytes: 0.05 10*3/uL (ref 0.00–0.07)
Basophils Absolute: 0 10*3/uL (ref 0.0–0.1)
Basophils Relative: 0 %
Eosinophils Absolute: 0.3 10*3/uL (ref 0.0–0.5)
Eosinophils Relative: 2 %
HCT: 34.5 % — ABNORMAL LOW (ref 39.0–52.0)
Hemoglobin: 10.8 g/dL — ABNORMAL LOW (ref 13.0–17.0)
Immature Granulocytes: 0 %
Lymphocytes Relative: 13 %
Lymphs Abs: 1.6 10*3/uL (ref 0.7–4.0)
MCH: 26 pg (ref 26.0–34.0)
MCHC: 31.3 g/dL (ref 30.0–36.0)
MCV: 82.9 fL (ref 80.0–100.0)
Monocytes Absolute: 0.8 10*3/uL (ref 0.1–1.0)
Monocytes Relative: 7 %
Neutro Abs: 9.1 10*3/uL — ABNORMAL HIGH (ref 1.7–7.7)
Neutrophils Relative %: 78 %
Platelets: 483 10*3/uL — ABNORMAL HIGH (ref 150–400)
RBC: 4.16 MIL/uL — ABNORMAL LOW (ref 4.22–5.81)
RDW: 16 % — ABNORMAL HIGH (ref 11.5–15.5)
WBC: 11.8 10*3/uL — ABNORMAL HIGH (ref 4.0–10.5)
nRBC: 0 % (ref 0.0–0.2)

## 2020-05-31 LAB — ETHANOL: Alcohol, Ethyl (B): <10 mg/dL (ref <10)

## 2020-05-31 MED ORDER — FENTANYL CITRATE (PF) 100 MCG/2ML IJ SOLN
50.0000 ug | Freq: Once | INTRAMUSCULAR | Status: AC
  Administered 2020-05-31: 50 ug via INTRAVENOUS

## 2020-05-31 MED ORDER — ONDANSETRON HCL 4 MG/2ML IJ SOLN
4.0000 mg | Freq: Once | INTRAMUSCULAR | Status: AC
  Administered 2020-05-31: 4 mg via INTRAVENOUS
  Filled 2020-05-31: qty 2

## 2020-05-31 MED ORDER — FENTANYL CITRATE (PF) 100 MCG/2ML IJ SOLN
50.0000 ug | INTRAMUSCULAR | Status: DC | PRN
  Administered 2020-05-31: 50 ug via INTRAVENOUS
  Filled 2020-05-31 (×2): qty 2

## 2020-05-31 MED ORDER — IOHEXOL 300 MG/ML  SOLN
100.0000 mL | Freq: Once | INTRAMUSCULAR | Status: AC | PRN
  Administered 2020-05-31: 100 mL via INTRAVENOUS

## 2020-05-31 MED ORDER — OXYCODONE-ACETAMINOPHEN 5-325 MG PO TABS: 1.0000 | ORAL_TABLET | Freq: Four times a day (QID) | ORAL | 0 refills | Status: DC | PRN

## 2020-05-31 MED ORDER — HYDROMORPHONE HCL 1 MG/ML IJ SOLN
1.0000 mg | Freq: Once | INTRAMUSCULAR | Status: AC
  Administered 2020-05-31: 1 mg via INTRAVENOUS
  Filled 2020-05-31: qty 1

## 2020-05-31 MED ORDER — IBUPROFEN 600 MG PO TABS: 600.0000 mg | ORAL_TABLET | Freq: Four times a day (QID) | ORAL | 0 refills | Status: DC | PRN

## 2020-05-31 NOTE — Discharge Instructions (Addendum)
Your testing shows that you have some nodules in your lung -  This needs to be followed - talk to your doctor about getting retested in the next 3-6 months - if you don't have a doctor see the phone number on this paperwork to arrange follow up in the next week.  Your upper arm is broken near your shoulder - this may heal without surgery - just use the sling until you see the orthopedic (bone) doctor this week - Dr. Rex Kras is on call - call for appointment in the next week.  Ibuprofen 3 times daily for pain Ice and elevate the shoulder Oxycodone only for severe pain - one tablet every 6 hours as needed - this is constipating - so use a stool softener as needed
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
## 2020-05-31 NOTE — ED Notes (Signed)
Patient verbalizes understanding of discharge instructions. Opportunity for questioning and answers were provided. Armband removed by staff, pt discharged from ED.  

## 2020-05-31 NOTE — Progress Notes (Signed)
Orthopedic Tech Progress Note Patient Details:  Connor Solomon 02-25-1961 [id number]  Ortho Devices Type of Ortho Device: Coapt Ortho Device/Splint Location: RUE Ortho Device/Splint Interventions: Application   Post Interventions Patient Tolerated: [name] Railyn House 05/31/2020, 5:32 AM

## 2020-05-31 NOTE — ED Notes (Signed)
Patient transported to CT scan . 

## 2020-05-31 NOTE — ED Notes (Signed)
Ortho tech paged by Network engineer .

## 2020-06-02 ENCOUNTER — Emergency Department
Admission: EM | Admit: 2020-06-02 | Discharge: 2020-06-02 | Disposition: A | Discharge status: Home / Self Care | Payer: Self-pay | Attending: Emergency Medicine | Admitting: Emergency Medicine

## 2020-06-02 ENCOUNTER — Emergency Department: Payer: Self-pay

## 2020-06-02 ENCOUNTER — Other Ambulatory Visit: Payer: Self-pay

## 2020-06-02 ENCOUNTER — Encounter: Payer: Self-pay | Admitting: Emergency Medicine

## 2020-06-02 DIAGNOSIS — I10 Essential (primary) hypertension: Secondary | ICD-10-CM | POA: Insufficient documentation

## 2020-06-02 DIAGNOSIS — M25511 Pain in right shoulder: Secondary | ICD-10-CM | POA: Insufficient documentation

## 2020-06-02 DIAGNOSIS — F17290 Nicotine dependence, other tobacco product, uncomplicated: Secondary | ICD-10-CM | POA: Insufficient documentation

## 2020-06-02 DIAGNOSIS — Z79899 Other long term (current) drug therapy: Secondary | ICD-10-CM | POA: Insufficient documentation

## 2020-06-02 DIAGNOSIS — S42213A Unspecified displaced fracture of surgical neck of unspecified humerus, initial encounter for closed fracture: Secondary | ICD-10-CM

## 2020-06-02 DIAGNOSIS — Y9389 Activity, other specified: Secondary | ICD-10-CM | POA: Insufficient documentation

## 2020-06-02 DIAGNOSIS — Y9289 Other specified places as the place of occurrence of the external cause: Secondary | ICD-10-CM | POA: Insufficient documentation

## 2020-06-02 DIAGNOSIS — Y998 Other external cause status: Secondary | ICD-10-CM | POA: Insufficient documentation

## 2020-06-02 DIAGNOSIS — S42211A Unspecified displaced fracture of surgical neck of right humerus, initial encounter for closed fracture: Secondary | ICD-10-CM | POA: Insufficient documentation

## 2020-06-02 MED ORDER — OXYCODONE-ACETAMINOPHEN 5-325 MG PO TABS
1.0000 | ORAL_TABLET | Freq: Once | ORAL | Status: AC
  Administered 2020-06-02: 1 via ORAL
  Filled 2020-06-02: qty 1

## 2020-06-02 MED ORDER — OXYCODONE-ACETAMINOPHEN 5-325 MG PO TABS: 1.0000 | ORAL_TABLET | Freq: Four times a day (QID) | ORAL | 0 refills | Status: DC | PRN

## 2020-06-02 MED ORDER — ONDANSETRON 4 MG PO TBDP
4.0000 mg | ORAL_TABLET | Freq: Once | ORAL | Status: AC
  Administered 2020-06-02: 4 mg via ORAL
  Filled 2020-06-02: qty 1
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
## 2020-06-21 ENCOUNTER — Emergency Department
Admission: EM | Admit: 2020-06-21 | Discharge: 2020-06-21 | Disposition: A | Discharge status: Home / Self Care | Payer: Self-pay | Attending: Emergency Medicine | Admitting: Emergency Medicine

## 2020-06-21 ENCOUNTER — Encounter: Payer: Self-pay | Admitting: Emergency Medicine

## 2020-06-21 ENCOUNTER — Other Ambulatory Visit: Payer: Self-pay

## 2020-06-21 DIAGNOSIS — R21 Rash and other nonspecific skin eruption: Secondary | ICD-10-CM

## 2020-06-21 DIAGNOSIS — F17290 Nicotine dependence, other tobacco product, uncomplicated: Secondary | ICD-10-CM | POA: Insufficient documentation

## 2020-06-21 DIAGNOSIS — B86 Scabies: Secondary | ICD-10-CM | POA: Insufficient documentation

## 2020-06-21 DIAGNOSIS — I10 Essential (primary) hypertension: Secondary | ICD-10-CM | POA: Insufficient documentation

## 2020-06-21 DIAGNOSIS — Z79899 Other long term (current) drug therapy: Secondary | ICD-10-CM | POA: Insufficient documentation

## 2020-06-21 MED ORDER — PERMETHRIN 5 % EX CREA: 1.0000 "application " | TOPICAL_CREAM | Freq: Once | CUTANEOUS | 0 refills | Status: AC

## 2020-06-21 MED ORDER — HYDROXYZINE HCL 25 MG PO TABS: ORAL_TABLET | ORAL | 0 refills | Status: DC

## 2020-06-21 NOTE — Discharge Instructions (Signed)
Begin taking the hydroxyzine 1 or 2 every 6 hours as needed for itching.  This medication could cause drowsiness and increase your risk for falling.  Also a prescription for the permethrin cream was sent to your pharmacy.  If this does not completely clear up your rash you will need to see a dermatologist.  The 2 dermatology offices in [place] are listed on your discharge papers.  Call make an appointment.

## 2020-06-21 NOTE — ED Triage Notes (Signed)
Pt to ED via POV stating that he has a rash on his legs and waist since April, pt was diagnosed with scabies. Pt states that he used the medication he was given but it has not went away. Pt is in NAD.

## 2020-06-21 NOTE — ED Provider Notes (Signed)
[facility] Emergency Department Provider Note   ____________________________________________   First MD Initiated Contact with Patient 06/21/20 1024     (approximate)  I have reviewed the triage vital signs and the nursing notes.   HISTORY  Chief Complaint Rash    HPI Connor Solomon is a 59 y.o. male presents to the ED with complaint of rash on his legs and waist since April of this year.  Patient has been seen in the emergency department in [place] 3 times for the same rash.  He states he is not have the money to see a dermatologist.  All 3 times he was diagnosed with scabies.  He states that it did get better with using the cream.  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  He reports that this all began when he went to the beach back in April.        Past Medical History:  Diagnosis Date  . Arthritis    hands, elbows bilaterally  . Full dentures   . Hypertension    Pt states he doen not have HTN and has never been treated for HTN  . Kidney stone on left side last stone 4-5 yrs ago   recurrent (3 episodes)  . Panic disorder    pt states has resolved  . Syncope    resolved- was related to panic attacks    Patient Active Problem List   Diagnosis Date Noted  . Injury of middle finger 06/09/2011  . INSOMNIA 11/17/2009  . WRIST PAIN, LEFT 10/14/2009  . PANIC DISORDER 09/22/2009  . HYPERTENSION, BENIGN ESSENTIAL 09/22/2009    Past Surgical History:  Procedure Laterality Date  . CARPAL METACARPAL FUSION WITH DISTAL RADIAL BONE GRAFT Left 05/30/2013   Procedure: LEFT THUMB METACARPAL JOINT FUSION;  Surgeon: [name], MD;  Location: [facility];  Service: Orthopedics;  Laterality: Left;  . ESOPHAGOGASTRODUODENOSCOPY N/A 02/12/2014   Procedure: ESOPHAGOGASTRODUODENOSCOPY (EGD);  Surgeon: [name], MD;  Location[facility] ENDOSCOPY;  Service: Endoscopy;  Laterality: N/A;  .  FOREIGN BODY REMOVAL N/A 02/12/2014   Procedure: FOREIGN BODY REMOVAL;  Surgeon: [name], MD;  Location: [facility] ENDOSCOPY;  Service: Endoscopy;  Laterality: N/A;  . OPEN REDUCTION INTERNAL FIXATION (ORIF) DISTAL RADIAL FRACTURE Left 11/29/2012   Procedure: OPEN REDUCTION INTERNAL FIXATION (ORIF) DISTAL RADIAL FRACTURE;  Surgeon: [name], MD;  Location: [facility];  Service: Orthopedics;  Laterality: Left;  LEFT DISTAL RADIUS OSTEOTOMY WITH BONE GRAFT  . TENDON REPAIR Left 02/07/2013   Procedure: LEFT EXTENSOR INDICIS PROPRIUS  TO EXTENSOR POLLICIS LONGUS TENDON TRANSFER;  Surgeon: [name], MD;  Location:  SURGERY CENTER;  Service: Orthopedics;  Laterality: Left;  . WISDOM TOOTH EXTRACTION    . WRIST SURGERY     fx only    Prior to Admission medications   Medication Sig Start Date End Date Taking? Authorizing Provider  hydrOXYzine (ATARAX/VISTARIL) 25 MG tablet Take 1-2 every 6 hours prn itching 06/21/20   Tommi Rumps, PA-C  oxyCODONE-acetaminophen (PERCOCET/ROXICET) 5-325 MG tablet Take 1 tablet by mouth every 6 (six) hours as needed for severe pain. 06/02/20   Cuthriell, Delorise Royals, PA-C  permethrin (ELIMITE) 5 % cream Apply 1 application topically once for 1 dose. 06/21/20 06/21/20  Tommi Rumps, PA-C    Allergies Hydrocodone  Family History  Problem Relation Age of Onset  . Heart attack  Father   . Diabetes Neg Hx   . Hyperlipidemia Neg Hx   . Hypertension Neg Hx     Social History Social History   Tobacco Use  . Smoking status: Current Some Day Smoker    Types: Cigars  . Smokeless tobacco: Never Used  Substance Use Topics  . Alcohol use: No  . Drug use: No    Review of Systems Constitutional: No fever/chills Eyes: No visual changes. Cardiovascular: Denies chest pain. Respiratory: Denies shortness of breath. Gastrointestinal: No abdominal pain.  No nausea, no vomiting.  No diarrhea.   Genitourinary: Negative for  dysuria. Musculoskeletal: Negative for back pain. Skin: Positive for rash. Neurological: Negative for headaches, focal weakness or numbness. ____________________________________________   PHYSICAL EXAM:  VITAL SIGNS: ED Triage Vitals  Enc Vitals Group     BP 06/21/20 1016 139/85     Pulse Rate 06/21/20 1016 95     Resp 06/21/20 1016 16     Temp 06/21/20 1022 98 F (36.7 C)     Temp Source 06/21/20 1016 Oral     SpO2 06/21/20 1016 99 %     Weight 06/21/20 1016 160 lb (72.6 kg)     Height 06/21/20 1016 $RemoveBefor'5\' 8"'[id number]$  (1.727 m)     Head Circumference --      Peak Flow --      Pain Score 06/21/20 1021 0     Pain Loc --      Pain Edu? --      Excl. in [place]? --     Constitutional: Alert and oriented. Well appearing and in no acute distress. Eyes: Conjunctivae are normal.  Head: Atraumatic. Neck: No stridor.   Cardiovascular: Normal rate, regular rhythm. Grossly normal heart sounds.  Good peripheral circulation. Respiratory: Normal respiratory effort.  No retractions. Lungs CTAB. Musculoskeletal: Moves upper and lower extremities without any difficulty.  Normal gait was noted. Neurologic:  Normal speech and language. No gross focal neurologic deficits are appreciated. No gait instability. Skin:  Skin is warm, dry.  There is a rash over the lower extremities in a diffuse pattern.  Areas are slightly erythematous but no pustules or vesicles noted.  Areas are crusty and pinpoint.  No involvement is noted in the webspaces of the hands.  At this time there is no rash noted to the upper extremities. Psychiatric: Mood and affect are normal. Speech and behavior are normal.  ____________________________________________   LABS (all labs ordered are listed, but only abnormal results are displayed)  Labs Reviewed - No data to display ____________________________________________   PROCEDURES  Procedure(s) performed (including Critical  Care):  Procedures   ____________________________________________   INITIAL IMPRESSION / ASSESSMENT AND PLAN / ED COURSE  As part of my medical decision making, I reviewed the following data within the electronic MEDICAL RECORD NUMBER Notes from prior ED visits and [place] Controlled Substance Database  60 year old male presents to the ED with complaint of a rash since April 2021.  Patient was diagnosed with scabies at 3 different times in [facility] emergency department.  Patient has not seen a dermatologist for financial reasons.  Patient states that this is the same rash that occurred and was treated with Elimite cream.  Patient states that rash disappeared after using this cream in the past.  He was given a prescription for hydroxyzine 25 mg 1 or 2 every 6 hours as needed for itching and promethazine 5% cream.  He was also given the names of the 2 dermatology offices in [place] as  this is becoming chronic.  ____________________________________________   FINAL CLINICAL IMPRESSION(S) / ED DIAGNOSES  Final diagnoses:  Scabies  Rash and nonspecific skin eruption     ED Discharge Orders         Ordered    hydrOXYzine (ATARAX/VISTARIL) 25 MG tablet        06/21/20 1110    permethrin (ELIMITE) 5 % cream   Once        06/21/20 1110          *Please note:  Connor Solomon was evaluated in Emergency Department on 06/21/2020 for the symptoms described in the history of present illness. He was evaluated in the context of the global COVID-19 pandemic, which necessitated consideration that the patient might be at risk for infection with the SARS-CoV-2 virus that causes COVID-19. Institutional protocols and algorithms that pertain to the evaluation of patients at risk for COVID-19 are in a state of rapid change based on information released by regulatory bodies including the CDC and federal and state organizations. These policies and algorithms were followed during the patient's care in the ED.  Some  ED evaluations and interventions may be delayed as a result of limited staffing during and the pandemic.*   Note:  This document was prepared using Dragon voice recognition software and may include unintentional dictation errors.    Johnn Hai, PA-C 06/21/20 1420    Lucrezia Starch, MD 06/21/20 [phone]

## 2020-07-22 ENCOUNTER — Other Ambulatory Visit: Payer: Self-pay

## 2020-07-22 ENCOUNTER — Encounter (INDEPENDENT_AMBULATORY_CARE_PROVIDER_SITE_OTHER): Payer: Self-pay | Admitting: Primary Care

## 2020-07-22 ENCOUNTER — Other Ambulatory Visit (INDEPENDENT_AMBULATORY_CARE_PROVIDER_SITE_OTHER): Payer: Self-pay | Admitting: Primary Care

## 2020-07-22 ENCOUNTER — Ambulatory Visit (INDEPENDENT_AMBULATORY_CARE_PROVIDER_SITE_OTHER): Payer: Self-pay | Admitting: Primary Care

## 2020-07-22 VITALS — BP 147/89 | HR 115 | Temp 97.3°F | Ht 68.0 in | Wt 156.8 lb

## 2020-07-22 DIAGNOSIS — Z131 Encounter for screening for diabetes mellitus: Secondary | ICD-10-CM

## 2020-07-22 DIAGNOSIS — D649 Anemia, unspecified: Secondary | ICD-10-CM

## 2020-07-22 DIAGNOSIS — Z7689 Persons encountering health services in other specified circumstances: Secondary | ICD-10-CM

## 2020-07-22 DIAGNOSIS — I10 Essential (primary) hypertension: Secondary | ICD-10-CM

## 2020-07-22 DIAGNOSIS — B86 Scabies: Secondary | ICD-10-CM

## 2020-07-22 DIAGNOSIS — Z1211 Encounter for screening for malignant neoplasm of colon: Secondary | ICD-10-CM

## 2020-07-22 DIAGNOSIS — G4700 Insomnia, unspecified: Secondary | ICD-10-CM

## 2020-07-22 DIAGNOSIS — F4323 Adjustment disorder with mixed anxiety and depressed mood: Secondary | ICD-10-CM

## 2020-07-22 DIAGNOSIS — Z720 Tobacco use: Secondary | ICD-10-CM

## 2020-07-22 DIAGNOSIS — Z1159 Encounter for screening for other viral diseases: Secondary | ICD-10-CM

## 2020-07-22 DIAGNOSIS — Z114 Encounter for screening for human immunodeficiency virus [HIV]: Secondary | ICD-10-CM

## 2020-07-22 LAB — POCT GLYCOSYLATED HEMOGLOBIN (HGB A1C): Hemoglobin A1C: 5.2 % (ref 4.0–5.6)

## 2020-07-22 MED ORDER — HYDROXYZINE HCL 25 MG PO TABS: ORAL_TABLET | ORAL | 1 refills | Status: DC

## 2020-07-22 MED ORDER — HYDROCHLOROTHIAZIDE 25 MG PO TABS: 25.0000 mg | ORAL_TABLET | Freq: Every day | ORAL | 0 refills | Status: DC

## 2020-07-22 MED ORDER — PERMETHRIN 0.5 % AERO: INHALATION_SPRAY | Freq: Once | 0 refills | Status: DC

## 2020-07-22 MED FILL — HYDROCHLOROTHIAZIDE 25 MG T: 25 | 30 days supply | Qty: 30 | Fill #0

## 2020-07-22 MED FILL — hydrOXYzine HCL 25 MG TABS: 25 | 11 days supply | Qty: 90 | Fill #0

## 2020-07-22 NOTE — Telephone Encounter (Signed)
Pt requesting change of pharmacy. Original pharmacy does not have the meds to fill prescription. Requested Prescriptions  Pending Prescriptions Disp Refills  . hydrochlorothiazide (HYDRODIURIL) 25 MG tablet 90 tablet 0    Sig: Take 1 tablet (25 mg total) by mouth daily.     Cardiovascular: Diuretics - Thiazide Failed - 07/22/2020 12:26 PM      Failed - Last BP in normal range    BP Readings from Last 1 Encounters:  07/22/20 (!) 147/89         Passed - Ca in normal range and within 360 days    Calcium  Date Value Ref Range Status  05/31/2020 9.2 8.9 - 10.3 mg/dL Final   Calcium, Ion  Date Value Ref Range Status  08/26/2009 1.15 1.12 - 1.32 mmol/L Final         Passed - Cr in normal range and within 360 days    Creatinine, Ser  Date Value Ref Range Status  05/31/2020 0.78 0.61 - 1.24 mg/dL Final         Passed - K in normal range and within 360 days    Potassium  Date Value Ref Range Status  05/31/2020 3.9 3.5 - 5.1 mmol/L Final         Passed - Na in normal range and within 360 days    Sodium  Date Value Ref Range Status  05/31/2020 136 135 - 145 mmol/L Final         Passed - Valid encounter within last 6 months    Recent Outpatient Visits          Today Screening for diabetes mellitus   [place], [place], NP      Future Appointments            In 1 month Oletta Lamas, [place] Cage, NP [place]           . pyrethrins-piperonyl butoxide 0.5 % bottle 150 mL 0    Sig: Apply topically once for 1 dose.     Off-Protocol Failed - 07/22/2020 12:26 PM      Failed - Medication not assigned to a protocol, review manually.      Passed - Valid encounter within last 12 months    Recent Outpatient Visits          Today Screening for diabetes mellitus   [place], [place], NP      Future Appointments            In 1 month Oletta Lamas, [place] Cage, NP [place]           . hydrOXYzine (ATARAX/VISTARIL) 25 MG tablet 90 tablet 1    Sig: Take 1-2 every 6 hours prn itching     Ear, Nose, and Throat:  Antihistamines Passed - 07/22/2020 12:26 PM      Passed - Valid encounter within last 12 months    Recent Outpatient Visits          Today Screening for diabetes mellitus   [place] [name], NP      Future Appointments            In 1 month Oletta Lamas, [place] Cage, NP [place]

## 2020-07-22 NOTE — Progress Notes (Signed)
Established Patient Office Visit  Subjective:  Patient ID: Connor Solomon, male    DOB: 1961/04/08  Age: 59 y.o. MRN: [id number]  CC:  Chief Complaint  Patient presents with  . New Patient (Initial Visit)    scabies   . Neck Injury   For nerves sressful both brother lung and pancreatis HPI [name] is a 59 year old male who presents for care and also has a neck injury and limited range of motion of his right arm which is in a sling.  He is followed by orthopedics.  Blood pressure today is elevated.. Denies shortness of breath, headaches, chest pain or lower extremity edema. States has scabies   Past Medical History:  Diagnosis Date  . Arthritis    hands, elbows bilaterally  . Full dentures   . Hypertension    Pt states he doen not have HTN and has never been treated for HTN  . Kidney stone on left side last stone 4-5 yrs ago   recurrent (3 episodes)  . Panic disorder    pt states has resolved  . Syncope    resolved- was related to panic attacks    Past Surgical History:  Procedure Laterality Date  . CARPAL METACARPAL FUSION WITH DISTAL RADIAL BONE GRAFT Left 05/30/2013   Procedure: LEFT THUMB METACARPAL JOINT FUSION;  Surgeon: Schuyler Amor, MD;  Location: [place];  Service: Orthopedics;  Laterality: Left;  . ESOPHAGOGASTRODUODENOSCOPY N/A 02/12/2014   Procedure: ESOPHAGOGASTRODUODENOSCOPY (EGD);  Surgeon: Jerene Bears, MD;  [facility] ENDOSCOPY;  Service: Endoscopy;  Laterality: N/A;  . FOREIGN BODY REMOVAL N/A 02/12/2014   Procedure: FOREIGN BODY REMOVAL;  Surgeon: Jerene Bears, MD;  Location: [place];  Service: Endoscopy;  Laterality: N/A;  . OPEN REDUCTION INTERNAL FIXATION (ORIF) DISTAL RADIAL FRACTURE Left 11/29/2012   Procedure: OPEN REDUCTION INTERNAL FIXATION (ORIF) DISTAL RADIAL FRACTURE;  Surgeon: Schuyler Amor, MD;  Location: [place];  Service: Orthopedics;  Laterality: Left;  LEFT DISTAL RADIUS OSTEOTOMY  WITH BONE GRAFT  . TENDON REPAIR Left 02/07/2013   Procedure: LEFT EXTENSOR INDICIS PROPRIUS  TO EXTENSOR POLLICIS LONGUS TENDON TRANSFER;  Surgeon: Schuyler Amor, MD;  Location: [place];  Service: Orthopedics;  Laterality: Left;  . WISDOM TOOTH EXTRACTION    . WRIST SURGERY     fx only    Family History  Problem Relation Age of Onset  . Heart attack Father   . Diabetes Neg Hx   . Hyperlipidemia Neg Hx   . Hypertension Neg Hx     Social History   Socioeconomic History  . Marital status: Single    Spouse name: Not on file  . Number of children: Not on file  . Years of education: Not on file  . Highest education level: Not on file  Occupational History  . Not on file  Tobacco Use  . Smoking status: Current Some Day Smoker    Types: Cigars  . Smokeless tobacco: Never Used  Substance and Sexual Activity  . Alcohol use: No  . Drug use: No  . Sexual activity: Not on file  Other Topics Concern  . Not on file  Social History Narrative  . Not on file   Social Determinants of Health   Financial Resource Strain:   . Difficulty of Paying Living Expenses: Not on file  Food Insecurity:   . Worried About Charity fundraiser in the Last Year: Not on file  .  Ran Out of Food in the Last Year: Not on file  Transportation Needs:   . Lack of Transportation (Medical): Not on file  . Lack of Transportation (Non-Medical): Not on file  Physical Activity:   . Days of Exercise per Week: Not on file  . Minutes of Exercise per Session: Not on file  Stress:   . Feeling of Stress : Not on file  Social Connections:   . Frequency of Communication with Friends and Family: Not on file  . Frequency of Social Gatherings with Friends and Family: Not on file  . Attends Religious Services: Not on file  . Active Member of Clubs or Organizations: Not on file  . Attends Archivist Meetings: Not on file  . Marital Status: Not on file  Intimate Partner Violence:   .  Fear of Current or Ex-Partner: Not on file  . Emotionally Abused: Not on file  . Physically Abused: Not on file  . Sexually Abused: Not on file    Outpatient Medications Prior to Visit  Medication Sig Dispense Refill  . hydrOXYzine (ATARAX/VISTARIL) 25 MG tablet Take 1-2 every 6 hours prn itching 30 tablet 0  . oxyCODONE-acetaminophen (PERCOCET/ROXICET) 5-325 MG tablet Take 1 tablet by mouth every 6 (six) hours as needed for severe pain. 20 tablet 0   No facility-administered medications prior to visit.    Allergies  Allergen Reactions  . Hydrocodone Itching and Nausea And Vomiting    ROS Review of Systems  Musculoskeletal: Positive for neck pain.  All other systems reviewed and are negative.     Objective:    Physical Exam Vitals reviewed.  HENT:     Head: Normocephalic.     Right Ear: There is impacted cerumen.     Left Ear: Tympanic membrane normal.     Nose: Nose normal.  Eyes:     Extraocular Movements: Extraocular movements intact.     Pupils: Pupils are equal, round, and reactive to light.  Neck:     Comments: Back of neck limpoma Cardiovascular:     Rate and Rhythm: Normal rate and regular rhythm.  Pulmonary:     Effort: Pulmonary effort is normal.     Breath sounds: Normal breath sounds.  Abdominal:     General: Bowel sounds are normal.     Palpations: Abdomen is soft.  Musculoskeletal:     Cervical back: Normal range of motion and neck supple.     Comments: Neck pain -ROM Right arm limited ROM  Skin:    General: Skin is warm and dry.  Neurological:     Mental Status: He is alert and oriented to person, place, and time.  Psychiatric:        Mood and Affect: Mood normal.        Behavior: Behavior normal.        Thought Content: Thought content normal.        Judgment: Judgment normal.     BP (!) 147/89 (BP Location: Left Arm, Patient Position: Sitting, Cuff Size: Normal)   Pulse (!) 115   Temp (!) 97.3 F (36.3 C) (Temporal)   Ht $R'5\' 8"'pm$   (1.727 m)   Wt 156 lb 12.8 oz (71.1 kg)   SpO2 98%   BMI 23.84 kg/m  Wt Readings from Last 3 Encounters:  07/22/20 156 lb 12.8 oz (71.1 kg)  06/21/20 160 lb (72.6 kg)  05/30/20 160 lb 15 oz (73 kg)     Health Maintenance Due  Topic Date  Due  . Hepatitis C Screening  Never done  . COVID-19 Vaccine (1) Never done  . COLONOSCOPY  Never done    There are no preventive care reminders to display for this patient.  Lab Results  Component Value Date   TSH 3.813 01/12/2010   Lab Results  Component Value Date   WBC 11.8 (H) 05/31/2020   HGB 10.8 (L) 05/31/2020   HCT 34.5 (L) 05/31/2020   MCV 82.9 05/31/2020   PLT 483 (H) 05/31/2020   Lab Results  Component Value Date   NA 136 05/31/2020   K 3.9 05/31/2020   CO2 24 05/31/2020   GLUCOSE 108 (H) 05/31/2020   BUN 8 05/31/2020   CREATININE 0.78 05/31/2020   BILITOT 0.4 05/31/2020   ALKPHOS 80 05/31/2020   AST 16 05/31/2020   ALT 11 05/31/2020   PROT 7.6 05/31/2020   ALBUMIN 3.1 (L) 05/31/2020   CALCIUM 9.2 05/31/2020   ANIONGAP 10 05/31/2020   Lab Results  Component Value Date   CHOL 190 10/02/2009   Lab Results  Component Value Date   HDL 35 (L) 10/02/2009   Lab Results  Component Value Date   LDLCALC 130 (H) 10/02/2009   Lab Results  Component Value Date   TRIG 126 10/02/2009   Lab Results  Component Value Date   CHOLHDL 5.4 Ratio 10/02/2009   Lab Results  Component Value Date   HGBA1C 5.2 07/22/2020      Assessment & Plan:  Connor Solomon was seen today for new patient (initial visit) and neck injury.  Diagnoses and all orders for this visit:  Screening for diabetes mellitus -     HgB A1c 5.2 per ADA guidelines patient is not diagnosed as pre or diabetic  Encounter to establish care Juluis Mire, NP-C will be your  (PCP) she is mastered prepared . Able to diagnosed and treatment also  answer health concern as well as continuing care of varied medical conditions, not limited by cause, organ system,  or diagnosis.   Colon cancer screening Recommended at the age of 64 preventive care health maintenance and care gaps -     Fecal occult blood, imunochemical; Future  Anemia, unspecified type Review of hospital records indicated iron deficiency anemia hemoglobin 10.8/hematocrit 34.5 RDW 16 Iron rich foods such as shellfish,liver, organ meats(liver, gizzard), and red meats can increase cholesterol and should be consumed in moderation.However; legumes(beans), spinach, pumpkin seeds, [place], broccoli, tofu, green leafy vegetables and dark chocolate can be consumed without  concern to cholesterol.  We will do anemia work-up -     Ferritin; Future -     Iron, TIBC and Ferritin Panel  Need for hepatitis C screening test Health maintenance ,preventive care and care gaps -     Hepatitis C Antibody  Screening for HIV (human immunodeficiency virus) Health maintenance ,preventive care and care gaps -     HIV Antibody (routine testing w rflx)  Scabies infestation -     Apply topically once for 1 dose. -     pyrethrins-piperonyl butoxide 0.5 % bottle;   Tobacco abuse Smokes one half to a pack a day depending on anxiety and stress he is aware of increased risk of respiratory complications.  Will discuss smoking cessation at each visit  HYPERTENSION, BENIGN ESSENTIAL Counseled on blood pressure goal of less than 130/80, low-sodium, DASH diet, medication compliance, 150 minutes of moderate intensity exercise per week. Discussed medication compliance, adverse effects. Review of blood pressures greater than 130/80 started on  hydrochlorothiazide 25 mg daily  Insomnia, unspecified type States he has lost 2 brothers and has difficulty sleeping at night will prescribe hydroxyzine for dual purposes insomnia and anxiety -  Adjustment disorder with mixed anxiety and depressed mood     Patient stated he did not want to be put on antidepressant medication was not something else prescribed  hydroxyzine   Follow-up: Return in about 6 weeks (around 09/02/2020) for Bp ck.    [name], NP

## 2020-07-22 NOTE — Patient Instructions (Addendum)
Scabies, Adult  Scabies is a skin condition that happens when very small insects get under the skin (infestation). This causes a rash and severe itchiness. Scabies can spread from person to person (is contagious). If you get scabies, it is common for others in your household to get scabies too. With proper treatment, symptoms usually go away in 2-4 weeks. Scabies usually does not cause lasting problems. What are the causes? This condition is caused by tiny mites (Sarcoptes scabiei, or human itch mites) that can only be seen with a microscope. The mites get into the top layer of skin and lay eggs. Scabies can spread from person to person through:  Close contact with a person who has scabies.  Sharing or having contact with infested items, such as towels, bedding, or clothing. What increases the risk? The following factors may make you more likely to develop this condition:  Living in a nursing home or other extended care facility.  Having sexual contact with a partner who has scabies.  Caring for others who are at increased risk for scabies. What are the signs or symptoms? Symptoms of this condition include:  Severe itchiness. This is often worse at night.  A rash that includes tiny red bumps or blisters. The rash commonly occurs on the hands, wrists, elbows, armpits, chest, waist, groin, or buttocks. The bumps may form a line (burrow) in some areas.  Skin irritation. This can include scaly patches or sores. How is this diagnosed? This condition may be diagnosed based on:  A physical exam of the skin.  A skin test. Your health care provider may take a sample of your affected skin (skin scraping) and have it examined under a microscope for signs of mites. How is this treated? This condition may be treated with:  Medicated cream or lotion that kills the mites. This is spread on the entire body and left on for several hours. Usually, one treatment with medicated cream or lotion is  enough to kill all the mites. In severe cases, the treatment may need to be repeated.  Medicated cream that relieves itching.  Medicines taken by mouth (orally) that: ? Relieve itching. ? Reduce the swelling and redness. ? Kill the mites. This treatment may be done in severe cases. Follow these instructions at home: Medicines   Take or apply over-the-counter and prescription medicines as told by your health care provider.  Apply medicated cream or lotion as told by your health care provider.  Do not wash off the medicated cream or lotion until the necessary amount of time has passed. Skin care   Avoid scratching the affected areas of your skin.  Keep your fingernails closely trimmed to reduce injury from scratching.  Take cool baths or apply cool washcloths to your skin to help reduce itching. General instructions  Clean all items that you recently had contact with, including bedding, clothing, and furniture. Do this on the same day that you start treatment. ? Dry clean items, or use hot water to wash items. Dry items on the hot dry cycle. ? Place items that cannot be washed into closed, airtight plastic bags for at least 3 days. The mites cannot live for more than 3 days away from human skin. ? Vacuum furniture and mattresses that you use.  Make sure that other people who may have been infested are examined by a health care provider. These include members of your household and anyone who may have had contact with infested items.  Keep all follow-up  visits as told by your health care provider. This is important. Contact a health care provider if:  You have itching that does not go away after 4 weeks of treatment.  You continue to develop new bumps or burrows.  You have redness, swelling, or pain in your rash area after treatment.  You have fluid, blood, or pus coming from your rash. Summary  Scabies is a skin condition that causes a rash and severe itchiness.  This  condition is caused by tiny mites that get into the top layer of the skin and lay eggs.  Scabies can spread from person to person.  Follow treatments as recommended by your health care provider.  Clean all items that you recently had contact with. This information is not intended to replace advice given to you by your health care provider. Make sure you discuss any questions you have with your health care provider. Document Revised: 08/02/2018 Document Reviewed: 08/02/2018 Elsevier Patient Education  2020 [employer].  Hypertension, Adult Hypertension is another name for high blood pressure. High blood pressure forces your heart to work harder to pump blood. This can cause problems over time. There are two numbers in a blood pressure reading. There is a top number (systolic) over a bottom number (diastolic). It is best to have a blood pressure that is below 120/80. Healthy choices can help lower your blood pressure, or you may need medicine to help lower it. What are the causes? The cause of this condition is not known. Some conditions may be related to high blood pressure. What increases the risk?  Smoking.  Having type 2 diabetes mellitus, high cholesterol, or both.  Not getting enough exercise or physical activity.  Being overweight.  Having too much fat, sugar, calories, or salt (sodium) in your diet.  Drinking too much alcohol.  Having long-term (chronic) kidney disease.  Having a family history of high blood pressure.  Age. Risk increases with age.  Race. You may be at higher risk if you are African American.  Gender. Men are at higher risk than women before age 14. After age 64, women are at higher risk than men.  Having obstructive sleep apnea.  Stress. What are the signs or symptoms?  High blood pressure may not cause symptoms. Very high blood pressure (hypertensive crisis) may cause: ? Headache. ? Feelings of worry or nervousness (anxiety). ? Shortness of  breath. ? Nosebleed. ? A feeling of being sick to your stomach (nausea). ? Throwing up (vomiting). ? Changes in how you see. ? Very bad chest pain. ? Seizures. How is this treated?  This condition is treated by making healthy lifestyle changes, such as: ? Eating healthy foods. ? Exercising more. ? Drinking less alcohol.  Your health care provider may prescribe medicine if lifestyle changes are not enough to get your blood pressure under control, and if: ? Your top number is above 130. ? Your bottom number is above 80.  Your personal target blood pressure may vary. Follow these instructions at home: Eating and drinking   If told, follow the DASH eating plan. To follow this plan: ? Fill one half of your plate at each meal with fruits and vegetables. ? Fill one fourth of your plate at each meal with whole grains. Whole grains include whole-wheat pasta, brown rice, and whole-grain bread. ? Eat or drink low-fat dairy products, such as skim milk or low-fat yogurt. ? Fill one fourth of your plate at each meal with low-fat (lean) proteins. Low-fat  proteins include fish, chicken without skin, eggs, beans, and tofu. ? Avoid fatty meat, cured and processed meat, or chicken with skin. ? Avoid pre-made or processed food.  Eat less than 1,500 mg of salt each day.  Do not drink alcohol if: ? Your doctor tells you not to drink. ? You are pregnant, may be pregnant, or are planning to become pregnant.  If you drink alcohol: ? Limit how much you use to:  0-1 drink a day for women.  0-2 drinks a day for men. ? Be aware of how much alcohol is in your drink. In the [place] one drink equals one 12 oz bottle of beer (355 mL), one 5 oz glass of wine (148 mL), or one 1 oz glass of hard liquor (44 mL). Lifestyle   Work with your doctor to stay at a healthy weight or to lose weight. Ask your doctor what the best weight is for you.  Get at least 30 minutes of exercise most days of the week. This  may include walking, swimming, or biking.  Get at least 30 minutes of exercise that strengthens your muscles (resistance exercise) at least 3 days a week. This may include lifting weights or doing Pilates.  Do not use any products that contain nicotine or tobacco, such as cigarettes, e-cigarettes, and chewing tobacco. If you need help quitting, ask your doctor.  Check your blood pressure at home as told by your doctor.  Keep all follow-up visits as told by your doctor. This is important. Medicines  Take over-the-counter and prescription medicines only as told by your doctor. Follow directions carefully.  Do not skip doses of blood pressure medicine. The medicine does not work as well if you skip doses. Skipping doses also puts you at risk for problems.  Ask your doctor about side effects or reactions to medicines that you should watch for. Contact a doctor if you:  Think you are having a reaction to the medicine you are taking.  Have headaches that keep coming back (recurring).  Feel dizzy.  Have swelling in your ankles.  Have trouble with your vision. Get help right away if you:  Get a very bad headache.  Start to feel mixed up (confused).  Feel weak or numb.  Feel faint.  Have very bad pain in your: ? Chest. ? Belly (abdomen).  Throw up more than once.  Have trouble breathing. Summary  Hypertension is another name for high blood pressure.  High blood pressure forces your heart to work harder to pump blood.  For most people, a normal blood pressure is less than 120/80.  Making healthy choices can help lower blood pressure. If your blood pressure does not get lower with healthy choices, you may need to take medicine. This information is not intended to replace advice given to you by your health care provider. Make sure you discuss any questions you have with your health care provider. Document Revised: 06/07/2018 Document Reviewed: 06/07/2018 Elsevier Patient  Education  2020 [employer].

## 2020-07-22 NOTE — Telephone Encounter (Signed)
Medication Refill - Medication:  hydrochlorothiazide (HYDRODIURIL) 25 MG tablet hydrOXYzine (ATARAX/VISTARIL) 25 MG tablet pyrethrins-piperonyl butoxide 0.5 % bottle   Pt needs this rerouted, current pharmacy does not have all meds in stock. Please advise   Has the patient contacted their pharmacy? Yes.   (Agent: If no, request that the patient contact the pharmacy for the refill.) (Agent: If yes, when and what did the pharmacy advise?)  Preferred Pharmacy (with phone number or street name):  [id number] - [place], [place] - [address]  [address] [place] [address]  Phone: [phone] Fax: [phone]    Agent: Please be advised that RX refills may take up to 3 business days. We ask that you follow-up with your pharmacy.

## 2020-07-23 ENCOUNTER — Other Ambulatory Visit (INDEPENDENT_AMBULATORY_CARE_PROVIDER_SITE_OTHER): Payer: Self-pay | Admitting: Primary Care

## 2020-07-23 ENCOUNTER — Telehealth: Payer: Self-pay | Admitting: Primary Care

## 2020-07-23 DIAGNOSIS — B86 Scabies: Secondary | ICD-10-CM

## 2020-07-23 LAB — IRON,TIBC AND FERRITIN PANEL
Ferritin: 255 ng/mL (ref 30–400)
Iron Saturation: 15 % (ref 15–55)
Iron: 31 ug/dL — ABNORMAL LOW (ref 38–169)
Total Iron Binding Capacity: 208 ug/dL — ABNORMAL LOW (ref 250–450)
UIBC: 177 ug/dL (ref 111–343)

## 2020-07-23 LAB — HEPATITIS C ANTIBODY: Hep C Virus Ab: 0.2 s/co ratio (ref 0.0–0.9)

## 2020-07-23 LAB — HIV ANTIBODY (ROUTINE TESTING W REFLEX): HIV Screen 4th Generation wRfx: NONREACTIVE

## 2020-07-23 MED ORDER — PERMETHRIN 0.5 % AERO: INHALATION_SPRAY | Freq: Once | 0 refills | Status: AC

## 2020-07-23 MED ORDER — PERMETHRIN 5 % EX CREA: 1.0000 "application " | TOPICAL_CREAM | Freq: Once | CUTANEOUS | 1 refills | Status: AC

## 2020-07-23 MED ORDER — IRON (FERROUS SULFATE) 325 (65 FE) MG PO TABS
325.0000 mg | ORAL_TABLET | ORAL | 1 refills | Status: DC
Start: 2020-07-23 — End: 2021-03-25

## 2020-07-23 NOTE — Telephone Encounter (Signed)
Copied from [place] [phone]. Topic: General - Other >> Jul 22, 2020  5:33 PM Yvette Rack wrote: Reason for CRM: Pt stated the wrong Rx that was sent to his pharmacy. Pt stated the Rx that was sent is for lice and he does not have lice. Pt stated the Rx should be for permethrin (ELIMITE) 5 % cream and sent to [id number] - [place], [place] - [address]

## 2020-07-24 NOTE — Telephone Encounter (Signed)
Please correct medication

## 2020-07-25 ENCOUNTER — Telehealth (INDEPENDENT_AMBULATORY_CARE_PROVIDER_SITE_OTHER): Payer: Self-pay

## 2020-07-25 NOTE — Telephone Encounter (Signed)
Spoke with patient. He confirmed his date of birth. He is aware that Hep C and HIV are negative. He is aware of iron deficiency anemia and iron supplement sent to pharmacy. Please correct Rx and resend. Nat Christen, CMA

## 2020-07-25 NOTE — Telephone Encounter (Signed)
-----   Message from [name], NP sent at 07/25/2020  8:58 AM EDT ----- Iron deficiency anemia place on iron supplements. Negative for Hep C and HIV

## 2020-07-25 NOTE — Telephone Encounter (Signed)
done

## 2020-08-23 ENCOUNTER — Emergency Department (HOSPITAL_COMMUNITY)
Admission: EM | Admit: 2020-08-23 | Discharge: 2020-08-23 | Disposition: A | Discharge status: Home / Self Care | Payer: Self-pay | Attending: Emergency Medicine | Admitting: Emergency Medicine

## 2020-08-23 ENCOUNTER — Encounter (HOSPITAL_COMMUNITY): Payer: Self-pay | Admitting: Emergency Medicine

## 2020-08-23 ENCOUNTER — Other Ambulatory Visit: Payer: Self-pay

## 2020-08-23 DIAGNOSIS — B86 Scabies: Secondary | ICD-10-CM | POA: Insufficient documentation

## 2020-08-23 DIAGNOSIS — F17290 Nicotine dependence, other tobacco product, uncomplicated: Secondary | ICD-10-CM | POA: Insufficient documentation

## 2020-08-23 DIAGNOSIS — Z79899 Other long term (current) drug therapy: Secondary | ICD-10-CM | POA: Insufficient documentation

## 2020-08-23 DIAGNOSIS — I10 Essential (primary) hypertension: Secondary | ICD-10-CM | POA: Insufficient documentation

## 2020-08-23 MED ORDER — PERMETHRIN 5 % EX CREA: TOPICAL_CREAM | CUTANEOUS | 2 refills | Status: DC

## 2020-08-23 NOTE — ED Provider Notes (Signed)
[facility] EMERGENCY DEPARTMENT Provider Note   CSN: [id number] Arrival date & time: 08/23/20  0242     History Chief Complaint  Patient presents with  . Scabies    Connor Solomon is a 59 y.o. male.  The history is provided by the patient and medical records.    59 year old male with history of hypertension, arthritis, recurrent scabies infection, here requesting refill of his permethrin cream.  States he has been dealing with this on and off since April.  States he will get it to clear up in a few days later it seems to come back.  States he went and saw a new doctor recently who gave him lice shampoo instead of permethrin cream.  He states he is just itching uncontrollably and has felt "spots" on him again.  States he notices this most around his waist, ankles, and wrists.  His wife is not currently having any symptoms.  He has wash their sheets several times but has not had there home sprayed by exterminator.  States he has tried atarax for itching without relief.  Past Medical History:  Diagnosis Date  . Arthritis    hands, elbows bilaterally  . Full dentures   . Hypertension    Pt states he doen not have HTN and has never been treated for HTN  . Kidney stone on left side last stone 4-5 yrs ago   recurrent (3 episodes)  . Panic disorder    pt states has resolved  . Syncope    resolved- was related to panic attacks    Patient Active Problem List   Diagnosis Date Noted  . Injury of middle finger 06/09/2011  . INSOMNIA 11/17/2009  . WRIST PAIN, LEFT 10/14/2009  . PANIC DISORDER 09/22/2009  . HYPERTENSION, BENIGN ESSENTIAL 09/22/2009    Past Surgical History:  Procedure Laterality Date  . CARPAL METACARPAL FUSION WITH DISTAL RADIAL BONE GRAFT Left 05/30/2013   Procedure: LEFT THUMB METACARPAL JOINT FUSION;  Surgeon: Schuyler Amor, MD;  Location: [facility];  Service: Orthopedics;  Laterality: Left;  . ESOPHAGOGASTRODUODENOSCOPY  N/A 02/12/2014   Procedure: ESOPHAGOGASTRODUODENOSCOPY (EGD);  Surgeon: Jerene Bears, MD;  Location: [facility] ENDOSCOPY;  Service: Endoscopy;  Laterality: N/A;  . FOREIGN BODY REMOVAL N/A 02/12/2014   Procedure: FOREIGN BODY REMOVAL;  Surgeon: Jerene Bears, MD;  Location: [facility];  Service: Endoscopy;  Laterality: N/A;  . OPEN REDUCTION INTERNAL FIXATION (ORIF) DISTAL RADIAL FRACTURE Left 11/29/2012   Procedure: OPEN REDUCTION INTERNAL FIXATION (ORIF) DISTAL RADIAL FRACTURE;  Surgeon: Schuyler Amor, MD;  Location: [facility];  Service: Orthopedics;  Laterality: Left;  LEFT DISTAL RADIUS OSTEOTOMY WITH BONE GRAFT  . TENDON REPAIR Left 02/07/2013   Procedure: LEFT EXTENSOR INDICIS PROPRIUS  TO EXTENSOR POLLICIS LONGUS TENDON TRANSFER;  Surgeon: Schuyler Amor, MD;  Location: [facility];  Service: Orthopedics;  Laterality: Left;  . WISDOM TOOTH EXTRACTION    . WRIST SURGERY     fx only       Family History  Problem Relation Age of Onset  . Heart attack Father   . Diabetes Neg Hx   . Hyperlipidemia Neg Hx   . Hypertension Neg Hx     Social History   Tobacco Use  . Smoking status: Current Some Day Smoker    Types: Cigars  . Smokeless tobacco: Never Used  Substance Use Topics  . Alcohol use: No  . Drug use: No  Home Medications Prior to Admission medications   Medication Sig Start Date End Date Taking? Authorizing Provider  hydrochlorothiazide (HYDRODIURIL) 25 MG tablet Take 1 tablet (25 mg total) by mouth daily. 07/22/20   [name], NP  hydrOXYzine (ATARAX/VISTARIL) 25 MG tablet Take 1-2 every 6 hours prn itching 07/22/20   [name], NP  Iron, Ferrous Sulfate, 325 (65 Fe) MG TABS Take 325 mg by mouth 1 day or 1 dose for 1 dose. 07/23/20 07/24/20  [name], NP  permethrin (ELIMITE) 5 % cream Apply to entire body other than face - let sit for 14 hours then wash off, may repeat in 1 week if still having symptoms  08/23/20   Larene Pickett, PA-C    Allergies    Hydrocodone  Review of Systems   Review of Systems  Skin:       scabies  All other systems reviewed and are negative.   Physical Exam Updated Vital Signs BP (!) 161/85 (BP Location: Left Arm)   Pulse (!) 102   Temp 97.8 F (36.6 C) (Oral)   Resp 16   SpO2 100%   Physical Exam Vitals and nursing note reviewed.  Constitutional:      Appearance: He is well-developed.  HENT:     Head: Normocephalic and atraumatic.  Eyes:     Conjunctiva/sclera: Conjunctivae normal.     Pupils: Pupils are equal, round, and reactive to light.  Cardiovascular:     Rate and Rhythm: Normal rate and regular rhythm.     Heart sounds: Normal heart sounds.  Pulmonary:     Effort: Pulmonary effort is normal.     Breath sounds: Normal breath sounds.  Abdominal:     General: Bowel sounds are normal.     Palpations: Abdomen is soft.  Musculoskeletal:        General: Normal range of motion.     Cervical back: Normal range of motion.  Skin:    General: Skin is warm and dry.     Comments: Scabbed bite appearing areas noted around the ankles, wrists, and around waistband of pants, no signs of superimposed infection or cellulitis  Neurological:     Mental Status: He is alert and oriented to person, place, and time.     ED Results / Procedures / Treatments   Labs (all labs ordered are listed, but only abnormal results are displayed) Labs Reviewed - No data to display  EKG None  Radiology No results found.  Procedures Procedures (including critical care time)  Medications Ordered in ED Medications - No data to display  ED Course  I have reviewed the triage vital signs and the nursing notes.  Pertinent labs & imaging results that were available during my care of the patient were reviewed by me and considered in my medical decision making (see chart for details).    MDM Rules/Calculators/A&P  59 year old male here with concern for  recurrent scabies infection.  Does have scabbed bites noted to bilateral ankles, wrists, and around the waistband of his pants.  No signs of superimposed infection or cellulitis.  Given refill of permethrin cream and instructed on home use.  Encouraged to wash all sheets, linens, and towels in hot water.  Continue clean surfaces in the home.  Close follow-up with PCP.  Return here for any new or acute changes.  Final Clinical Impression(s) / ED Diagnoses Final diagnoses:  Scabies    Rx / DC Orders ED Discharge Orders  Ordered    permethrin (ELIMITE) 5 % cream        08/23/20 0431           Larene Pickett, PA-C 08/23/20 0451    Fatima Blank, MD 08/23/20 [phone]

## 2020-08-23 NOTE — ED Triage Notes (Signed)
Patient reports scabies/skin itching  at legs , buttocks and arms for several months.

## 2020-09-02 ENCOUNTER — Ambulatory Visit (INDEPENDENT_AMBULATORY_CARE_PROVIDER_SITE_OTHER): Payer: Self-pay | Admitting: Primary Care

## 2020-09-02 ENCOUNTER — Encounter (INDEPENDENT_AMBULATORY_CARE_PROVIDER_SITE_OTHER): Payer: Self-pay | Admitting: Primary Care

## 2020-09-02 ENCOUNTER — Other Ambulatory Visit: Payer: Self-pay

## 2020-09-02 VITALS — BP 135/84 | HR 104 | Temp 97.5°F | Ht 68.0 in | Wt 154.8 lb

## 2020-09-02 DIAGNOSIS — I10 Essential (primary) hypertension: Secondary | ICD-10-CM

## 2020-09-02 DIAGNOSIS — G4700 Insomnia, unspecified: Secondary | ICD-10-CM

## 2020-09-02 DIAGNOSIS — Z1211 Encounter for screening for malignant neoplasm of colon: Secondary | ICD-10-CM

## 2020-09-02 MED ORDER — HYDROCHLOROTHIAZIDE 25 MG PO TABS: 25.0000 mg | ORAL_TABLET | Freq: Every day | ORAL | 0 refills | Status: DC

## 2020-09-02 NOTE — Patient Instructions (Addendum)
Insomnia Insomnia is a sleep disorder that makes it difficult to fall asleep or stay asleep. Insomnia can cause fatigue, low energy, difficulty concentrating, mood swings, and poor performance at work or school. There are three different ways to classify insomnia:  Difficulty falling asleep.  Difficulty staying asleep.  Waking up too early in the morning. Any type of insomnia can be long-term (chronic) or short-term (acute). Both are common. Short-term insomnia usually lasts for three months or less. Chronic insomnia occurs at least three times a week for longer than three months. What are the causes? Insomnia may be caused by another condition, situation, or substance, such as:  Anxiety.  Certain medicines.  Gastroesophageal reflux disease (GERD) or other gastrointestinal conditions.  Asthma or other breathing conditions.  Restless legs syndrome, sleep apnea, or other sleep disorders.  Chronic pain.  Menopause.  Stroke.  Abuse of alcohol, tobacco, or illegal drugs.  Mental health conditions, such as depression.  Caffeine.  Neurological disorders, such as Alzheimer's disease.  An overactive thyroid (hyperthyroidism). Sometimes, the cause of insomnia may not be known. What increases the risk? Risk factors for insomnia include:  Gender. Women are affected more often than men.  Age. Insomnia is more common as you get older.  Stress.  Lack of exercise.  Irregular work schedule or working night shifts.  Traveling between different time zones.  Certain medical and mental health conditions. What are the signs or symptoms? If you have insomnia, the main symptom is having trouble falling asleep or having trouble staying asleep. This may lead to other symptoms, such as:  Feeling fatigued or having low energy.  Feeling nervous about going to sleep.  Not feeling rested in the morning.  Having trouble concentrating.  Feeling irritable, anxious, or depressed. How  is this diagnosed? This condition may be diagnosed based on:  Your symptoms and medical history. Your health care provider may ask about: ? Your sleep habits. ? Any medical conditions you have. ? Your mental health.  A physical exam. How is this treated? Treatment for insomnia depends on the cause. Treatment may focus on treating an underlying condition that is causing insomnia. Treatment may also include:  Medicines to help you sleep.  Counseling or therapy.  Lifestyle adjustments to help you sleep better. Follow these instructions at home: Eating and drinking   Limit or avoid alcohol, caffeinated beverages, and cigarettes, especially close to bedtime. These can disrupt your sleep.  Do not eat a large meal or eat spicy foods right before bedtime. This can lead to digestive discomfort that can make it hard for you to sleep. Sleep habits   Keep a sleep diary to help you and your health care provider figure out what could be causing your insomnia. Write down: ? When you sleep. ? When you wake up during the night. ? How well you sleep. ? How rested you feel the next day. ? Any side effects of medicines you are taking. ? What you eat and drink.  Make your bedroom a dark, comfortable place where it is easy to fall asleep. ? Put up shades or blackout curtains to block light from outside. ? Use a white noise machine to block noise. ? Keep the temperature cool.  Limit screen use before bedtime. This includes: ? Watching TV. ? Using your smartphone, tablet, or computer.  Stick to a routine that includes going to bed and waking up at the same times every day and night. This can help you fall asleep faster. Consider  making a quiet activity, such as reading, part of your nighttime routine.  Try to avoid taking naps during the day so that you sleep better at night.  Get out of bed if you are still awake after 15 minutes of trying to sleep. Keep the lights down, but try reading or  doing a quiet activity. When you feel sleepy, go back to bed. General instructions  Take over-the-counter and prescription medicines only as told by your health care provider.  Exercise regularly, as told by your health care provider. Avoid exercise starting several hours before bedtime.  Use relaxation techniques to manage stress. Ask your health care provider to suggest some techniques that may work well for you. These may include: ? Breathing exercises. ? Routines to release muscle tension. ? Visualizing peaceful scenes.  Make sure that you drive carefully. Avoid driving if you feel very sleepy.  Keep all follow-up visits as told by your health care provider. This is important. Contact a health care provider if:  You are tired throughout the day.  You have trouble in your daily routine due to sleepiness.  You continue to have sleep problems, or your sleep problems get worse. Get help right away if:  You have serious thoughts about hurting yourself or someone else. If you ever feel like you may hurt yourself or others, or have thoughts about taking your own life, get help right away. You can go to your nearest emergency department or call:  Your local emergency services (911 in the [place]  A suicide crisis helpline, such as the [facility] at 507-611-0612. This is open 24 hours a day. Summary  Insomnia is a sleep disorder that makes it difficult to fall asleep or stay asleep.  Insomnia can be long-term (chronic) or short-term (acute).  Treatment for insomnia depends on the cause. Treatment may focus on treating an underlying condition that is causing insomnia.  Keep a sleep diary to help you and your health care provider figure out what could be causing your insomnia. This information is not intended to replace advice given to you by your health care provider. Make sure you discuss any questions you have with your health care provider. Document  Revised: 09/09/2017 Document Reviewed: 07/07/2017 Elsevier Patient Education  2020 [place]. Stool for Occult Blood Test Why am I having this test? Stool for occult blood, or fecal occult blood test (FOBT), is a test that is used to check for gastrointestinal (GI) bleeding, which may be a sign of colon cancer. This test can also detect small amounts of blood in your stool (feces) from other causes, such as ulcers, bleeding blood vessels, or hemorrhoids. This test may be done as part of an annual routine exam to screen for colorectal cancer. Screening is recommended for all adults starting at age 82 and continuing until age 87. Your health care provider may recommend screening at age 86. What is being tested? This test checks for blood in your stool. What kind of sample is taken?  A stool sample is required for this test. Your health care provider may collect the sample with a swab of the rectum. Or, you may be instructed to collect the sample in a container at home. If you are instructed to collect the sample at home, your health care provider will give you the instructions and supplies that you will need. How do I collect samples at home? You may be asked to collect stool samples at home. Follow instructions from your  health care provider about how to collect the samples. When collecting a stool sample at home, make sure you:  Use supplies and instructions that you received from the lab.  Have a bowel movement directly into a clean, dry container. Do not collect stool from the water in the toilet.  Transfer the sample into the germ-free (sterile) cup that you received from the lab.  Do not let any toilet paper or urine get into the cup.  Wash your hands with soap and water after collecting the sample.  Return the samples to the lab as instructed. How do I prepare for this test?  Do not eat any red meat during the 3 days before your test.  Follow instructions from your health care  provider about eating and drinking before the test. Your health care provider may instruct you to avoid other foods or substances. Tell a health care provider about:  All medicines you are taking, including vitamins, herbs, eye drops, creams, and over-the-counter medicines. You may be instructed to avoid certain medicines that are known to interfere with this test.  Any recent dental procedures you have had. How are the results reported? Your test results will be reported as either positive or negative for blood in your stool. What do the results mean? A negative test result means that there is no blood within the stool. A negative result is considered normal. A positive test result may mean that there is blood in the stool. Causes of blood in the stool include:  GI tumors.  Certain GI diseases.  GI trauma or recent surgery.  Hemorrhoids. If your test result is positive, additional tests may be needed to find the source of the bleeding. Talk with your health care provider about what your results mean. Questions to ask your health care provider Ask your health care provider, or the department that is doing the test:  When will my results be ready?  How will I get my results?  What are my treatment options?  What other tests do I need?  What are my next steps? Summary  Stool for occult blood, or fecal occult blood test (FOBT), is a test that is used to check for gastrointestinal (GI) bleeding, which may be a sign of colon cancer.  This test can also detect small amounts of blood in your stool (feces) from other causes, such as ulcers, bleeding blood vessels, or hemorrhoids.  Your health care provider may collect the sample with a swab of the rectum. Or, you may be instructed to collect the sample in a container at home.  A positive test result may mean that there is blood in the stool. This information is not intended to replace advice given to you by your health care  provider. Make sure you discuss any questions you have with your health care provider. Document Revised: 01/18/2019 Document Reviewed: 05/24/2017 Elsevier Patient Education  [place] .

## 2020-09-02 NOTE — Progress Notes (Signed)
[facility]   Connor Solomon. Barg is a 59 year old male who presents  for  hypertension evaluation, on previous visit medication was adjusted to include HCTZ $RemoveBefo'25mg'rcztdZxVwZG$  daily  Patient reports adherence with medications.  Current Medication List Past Medical History:  Diagnosis Date   Arthritis    hands, elbows bilaterally   Full dentures    Hypertension    Pt states Connor Solomon doen not have HTN and has never been treated for HTN   Kidney stone on left side last stone 4-5 yrs ago   recurrent (3 episodes)   Panic disorder    pt states has resolved   Syncope    resolved- was related to panic attacks   Past Medical History  Current Outpatient Medications on File Prior to Visit  Medication Sig Dispense Refill   hydrOXYzine (ATARAX/VISTARIL) 25 MG tablet Take 1-2 every 6 hours prn itching 90 tablet 1   permethrin (ELIMITE) 5 % cream Apply to entire body other than face - let sit for 14 hours then wash off, may repeat in 1 week if still having symptoms 60 g 2   Iron, Ferrous Sulfate, 325 (65 Fe) MG TABS Take 325 mg by mouth 1 day or 1 dose for 1 dose. 90 tablet 1   No current facility-administered medications on file prior to visit.   Dietary habits include: mostly healthy  Exercise habits include:walking  Family / Social history: brothers and mother diabetes/stroke  and father died from MI early age  ASCVD risk factors include- [place]  O:  Physical Exam Vitals reviewed.  Constitutional:      Appearance: Normal appearance. Connor Solomon is normal weight.  HENT:     Head: Normocephalic.     Nose: Nose normal.  Cardiovascular:     Rate and Rhythm: Normal rate and regular rhythm.  Pulmonary:     Effort: Pulmonary effort is normal.     Breath sounds: Normal breath sounds.  Abdominal:     General: Bowel sounds are normal.     Palpations: Abdomen is soft.  Musculoskeletal:     Cervical back: Normal range of motion.     Comments: Right arm decrease ROM able to lift 45 degrees.   Skin:    General: Skin is warm and dry.  Neurological:     Mental Status: Connor Solomon is alert and oriented to person, place, and time.  Psychiatric:        Mood and Affect: Mood normal.        Behavior: Behavior normal.        Thought Content: Thought content normal.        Judgment: Judgment normal.      Review of Systems  Musculoskeletal:       Hx of broken shoulder   Psychiatric/Behavioral: Positive for depression. The patient is nervous/anxious and has insomnia.   All other systems reviewed and are negative.   Last 3 Office BP readings: BP Readings from Last 3 Encounters:  09/02/20 135/84  08/23/20 140/86  07/22/20 (!) 147/89    BMET    Component Value Date/Time   NA 136 05/31/2020 0508   K 3.9 05/31/2020 0508   CL 102 05/31/2020 0508   CO2 24 05/31/2020 0508   GLUCOSE 108 (H) 05/31/2020 0508   BUN 8 05/31/2020 0508   CREATININE 0.78 05/31/2020 0508   CALCIUM 9.2 05/31/2020 0508   GFRNONAA >60 05/31/2020 0508   GFRAA >60 05/31/2020 8257    Renal function: CrCl cannot be  calculated (Patient's most recent lab result is older than the maximum 21 days allowed.).  Clinical ASCVD: No  The ASCVD Risk score Mikey Bussing DC Jr., et al., 2013) failed to calculate for the following reasons:   Cannot find a previous HDL lab   Cannot find a previous total cholesterol lab   A/P: Connor Solomon was seen today for blood pressure check.  Diagnoses and all orders for this visit:  Special screening for malignant neoplasms, colon -     Fecal occult blood, imunochemical([facility]); Future   Insomnia, unspecified type Also with anxiety and depression discussed trazodone or Buspar heard neither medication was good will think about it. Information given on AVS  -     Essential hypertensionEssential hypertension Hypertension longstanding diagnosed currently HCTZ $RemoveBefore'25mg'znSsRgLrKvzch$  daily  on current medications. BP Goal = 130/80  mmHg. Patient is adherent with current medications.  -Continued  -F/u  labs ordered - none  -Counseled on lifestyle modifications for blood pressure control including reduced dietary sodium, increased exercise, adequate sleep  [name]

## 2020-09-10 ENCOUNTER — Ambulatory Visit (INDEPENDENT_AMBULATORY_CARE_PROVIDER_SITE_OTHER): Payer: Self-pay | Admitting: *Deleted

## 2020-09-10 NOTE — Telephone Encounter (Signed)
Patient is calling to report he had constipation that caused a hemorrhoid. He is treating it with OTC treatment. Patient reports it did bleed- but has stopped now. Advised home care instructions and appointment was made at first available. Advised if he gets worse- go to UC to be seen. Reason for Disposition . [1] Home treatment > 3 days for rectal pain AND [2] not improved  Answer Assessment - Initial Assessment Questions 1. SYMPTOM:  "What's the main symptom you're concerned about?" (e.g., pain, itching, swelling, rash)     hemorrhoid 2. ONSET: "When did the hemorroid  start?"     Saturday night 3. RECTAL PAIN: "Do you have any pain around your rectum?" "How bad is the pain?"  (Scale 1-10; or mild, moderate, severe)  - MILD (1-3): doesn't interfere with normal activities   - MODERATE (4-7): interferes with normal activities or awakens from sleep, limping   - SEVERE (8-10): excruciating pain, unable to have a bowel movement      Pain and itching- mild 4. RECTAL ITCHING: "Do you have any itching in this area?" "How bad is the itching?"  (Scale 1-10; or mild, moderate, severe)  - MILD - doesn't interfere with normal activities   - MODERATE-SEVERE: interferes with normal activities or awakens from sleep     mild 5. CONSTIPATION: "Do you have constipation?" If Yes, ask: "How bad is it?"     Yes- patient was having stomach pain- took laxative  6. CAUSE: "What do you think is causing the anus symptoms?"     hemoroid 7. OTHER SYMPTOMS: "Do you have any other symptoms?"  (e.g., rectal bleeding, abdominal pain, vomiting, fever)     Rectal bleeding 8. PREGNANCY: "Is there any chance you are pregnant?" "When was your last menstrual period?"     n/a  Protocols used: RECTAL [facility]

## 2020-09-10 NOTE — Telephone Encounter (Signed)
Patient called and said he has hemorrhoids the last 5 days ,he was bleeding .He says he is swollen he has taken laxatives. he also has rectal burning and itching since this morning,please call [phone].

## 2020-09-16 ENCOUNTER — Ambulatory Visit (INDEPENDENT_AMBULATORY_CARE_PROVIDER_SITE_OTHER): Payer: Self-pay | Admitting: Primary Care

## 2020-09-29 ENCOUNTER — Other Ambulatory Visit: Payer: Self-pay | Admitting: *Deleted

## 2020-09-29 MED ORDER — PERMETHRIN 5 % EX CREA: TOPICAL_CREAM | CUTANEOUS | 2 refills | Status: DC

## 2020-09-29 NOTE — Telephone Encounter (Signed)
Requested medication (s) are due for refill today:no  Requested medication (s) are on the active medication list: yes  Last refill:  09/25/2020  Future visit scheduled: yes  Notes to clinic:  Patient has already been given all the refills and was last given on 09/25/2020. Patient should only be using once a week. Please review for refills Medication was prescribed in the ED.  Requested Prescriptions  Pending Prescriptions Disp Refills   permethrin (ELIMITE) 5 % cream 60 g 2    Sig: Apply to entire body other than face - let sit for 14 hours then wash off, may repeat in 1 week if still having symptoms      Off-Protocol Failed - 09/29/2020  9:42 AM      Failed - Medication not assigned to a protocol, review manually.      Passed - Valid encounter within last 12 months    Recent Outpatient Visits           3 weeks ago Special screening for malignant neoplasms, colon   [place] [name], NP   2 months ago Screening for diabetes mellitus   [place] [name], NP       Future Appointments             In 5 months Oletta Lamas, [place] Cage, NP [place]

## 2020-09-29 NOTE — Telephone Encounter (Signed)
Patient would like to have prescription sent to [facility] on [address].

## 2020-09-30 ENCOUNTER — Ambulatory Visit (INDEPENDENT_AMBULATORY_CARE_PROVIDER_SITE_OTHER): Payer: Self-pay | Admitting: *Deleted

## 2020-09-30 NOTE — Telephone Encounter (Signed)
Patient is needing RF on Rx- permethrin. Rx was filled yesterday- but was sent in as print. Patient is calling to report the medication is not at pharmacy. Call to pharmacy- verbal Rx given as written.  Patient advised if his symptoms do not improve- he may need appointment for follow up- advised to make that appointment if needed.  Reason for Disposition  [1] Prescription not at pharmacy AND [2] was prescribed by PCP recently (Exception: triager has access to EMR and prescription is recorded there. Go to Home Care and confirm for pharmacy.)  Answer Assessment - Initial Assessment Questions 1. DRUG NAME: "What medicine do you need to have refilled?"     Permethrin 2. REFILLS REMAINING: "How many refills are remaining?" (Note: The label on the medicine or pill bottle will show how many refills are remaining. If there are no refills remaining, then a renewal may be needed.)    0  5. SYMPTOMS: "Do you have any symptoms?"     itching  Rx has been sent to pharmacy- printed rx- verbal given to pharmacy.  Protocols used: MEDICATION REFILL AND RENEWAL CALL-A-AH

## 2020-09-30 NOTE — Telephone Encounter (Signed)
PT calling back to f/up / please advise

## 2020-10-23 ENCOUNTER — Other Ambulatory Visit (INDEPENDENT_AMBULATORY_CARE_PROVIDER_SITE_OTHER): Payer: Self-pay | Admitting: Primary Care

## 2020-10-23 ENCOUNTER — Other Ambulatory Visit: Payer: Self-pay

## 2020-10-23 NOTE — Telephone Encounter (Signed)
Medication Refill - Medication: permethrin (ELIMITE) 5 % cream  Pt stated he needed this asap today for his scabies /Pt stated it is an emergency / please advise when sent    Has the patient contacted their pharmacy? No. (Agent: If no, request that the patient contact the pharmacy for the refill.) (Agent: If yes, when and what did the pharmacy advise?)  Preferred Pharmacy (with phone number or street name): [id number] [place], [place] - [address]  [address] [place] [address]  Phone:  [phone] Fax:  [phone]   Agent: Please be advised that RX refills may take up to 3 business days. We ask that you follow-up with your pharmacy.

## 2020-10-23 NOTE — Telephone Encounter (Signed)
Requested medication (s) are due for refill today: yes  Requested medication (s) are on the active medication list: yes   Future visit scheduled:yes  Notes to clinic:   Medication not assigned to a protocol, review manually  Patient needs as soon as possible   Requested Prescriptions  Pending Prescriptions Disp Refills   permethrin (ELIMITE) 5 % cream 60 g 2    Sig: Apply to entire body other than face - let sit for 14 hours then wash off, may repeat in 1 week if still having symptoms      Off-Protocol Failed - 10/23/2020  2:21 PM      Failed - Medication not assigned to a protocol, review manually.      Passed - Valid encounter within last 12 months    Recent Outpatient Visits           1 month ago Special screening for malignant neoplasms, colon   [place] [name], NP   3 months ago Screening for diabetes mellitus   [place] [name], NP       Future Appointments             In 4 months Oletta Lamas, Milford Cage, NP [place]

## 2020-10-24 ENCOUNTER — Ambulatory Visit (INDEPENDENT_AMBULATORY_CARE_PROVIDER_SITE_OTHER): Payer: Self-pay | Admitting: Primary Care

## 2020-10-24 ENCOUNTER — Encounter (INDEPENDENT_AMBULATORY_CARE_PROVIDER_SITE_OTHER): Payer: Self-pay | Admitting: Primary Care

## 2020-10-24 ENCOUNTER — Other Ambulatory Visit: Payer: Self-pay

## 2020-10-24 DIAGNOSIS — R911 Solitary pulmonary nodule: Secondary | ICD-10-CM

## 2020-10-24 DIAGNOSIS — B86 Scabies: Secondary | ICD-10-CM

## 2020-10-24 DIAGNOSIS — G4700 Insomnia, unspecified: Secondary | ICD-10-CM

## 2020-10-24 MED ORDER — PERMETHRIN 5 % EX CREA: TOPICAL_CREAM | CUTANEOUS | 2 refills | Status: DC
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
## 2020-11-02 NOTE — Progress Notes (Signed)
Telephone Note  I connected with Connor Solomon on 11/02/20 at 10:30 AM EST by telephone and verified that I am speaking with the correct person using two identifiers.  Location: Patient: home  Provider: working from home   I discussed the limitations, risks, security and privacy concerns of performing an evaluation and management service by telephone and the availability of in person appointments. I also discussed with the patient that there may be a patient responsible charge related to this service. The patient expressed understanding and agreed to proceed.   History of Present Illness: [name] is a 60 year old male who has a fixation on having scabies.  He has been treated several times for scabies.  He describes feeling something itchy and crawling all over him.  Also discussed about how to get rid of scabies in the home.  He is requesting refill medication for the treatment of scabies.  He also mention a family member having cancer and would like to be checked for that also. He also continues to smoke cigarettes and is aware that this can lead to lung cancer. Unable to sleep at night. Hydroxyzine is not helping. He is secluded and feels bad that he can't be around others.  Request to see specialist about nodules on lungs   Past Medical History:  Diagnosis Date  . Arthritis    hands, elbows bilaterally  . Full dentures   . Hypertension    Pt states he doen not have HTN and has never been treated for HTN  . Kidney stone on left side last stone 4-5 yrs ago   recurrent (3 episodes)  . Panic disorder    pt states has resolved  . Syncope    resolved- was related to panic attacks   Current Outpatient Medications on File Prior to Visit  Medication Sig Dispense Refill  . hydrochlorothiazide (HYDRODIURIL) 25 MG tablet Take 1 tablet (25 mg total) by mouth daily. 90 tablet 0  . Iron, Ferrous Sulfate, 325 (65 Fe) MG TABS Take 325 mg by mouth 1 day or 1 dose for 1 dose. 90 tablet 1    No current facility-administered medications on file prior to visit.   Observations/Objective: There were no vitals taken for this visit. Pertinent negative and positive noted in HPI  Assessment and Plan: Connor Solomon was seen today for follow-up.  Diagnoses and all orders for this visit:  Scabies infestation  With proper treatment, symptoms usually go away in 2-4 weeks. Scabies usually does not cause lasting problems.Advised again this may  Be caused by close contact with a person who has scabies a  Sharing or having contact with infested items, such as towels, bedding, or clothing. Washing and cling clothes, linen and died in the sun. States that he does laundry everyday and is still bothered by it.   -     Discontinue: permethrin (ELIMITE) 5 % cream; Apply to entire body other than face - let sit for 14 hours then wash off, may repeat in 1 week if still having symptoms -     permethrin (ELIMITE) 5 % cream; Apply to entire body other than face - let sit for 14 hours then wash off, may repeat in 1 week if still having symptoms  Insomnia, unspecified type Can try melatonin $RemoveBefor'5mg'gMkVkHPzAfky$ -15 mg at night for sleep, can also do benadryl 25-$RemoveBefore'50mg'ktwiHTsqGblyL$  at night for sleep.  If this does not help we can re-evaluate treatment Lung nodule After reviewing history 05/31/20 (imaging)  Lungs/Pleura: There is no  evidence of airspace disease, consolidation, mass, pleural effusion or pneumothorax. Mild dependent probable atelectasis is noted. At least 4 RIGHT subpleural nodules are noted, all 4 mm or less. -     Ambulatory referral to Pulmonology    Follow Up Instructions:    I discussed the assessment and treatment plan with the patient. The patient was provided an opportunity to ask questions and all were answered. The patient agreed with the plan and demonstrated an understanding of the instructions.   The patient was advised to call back or seek an in-person evaluation if the symptoms worsen or if the condition  fails to improve as anticipate I provided  22 minutes of non-face-to-face time during this encounter.   [name], NP

## 2021-01-14 ENCOUNTER — Other Ambulatory Visit (INDEPENDENT_AMBULATORY_CARE_PROVIDER_SITE_OTHER): Payer: Self-pay

## 2021-01-14 DIAGNOSIS — B86 Scabies: Secondary | ICD-10-CM

## 2021-01-14 NOTE — Telephone Encounter (Signed)
Copied from [place] [id number]. Topic: Quick Communication - Rx Refill/Question >> Jan 14, 2021  9:38 AM Robina Ade, Helene Kelp D wrote: Medication: permethrin (ELIMITE) 5 % cream. Patient called and said that condition is acting up again and would like a refill on cream medication please.  Has the patient contacted their pharmacy? Yes.   (Agent: If no, request that the patient contact the pharmacy for the refill.) (Agent: If yes, when and what did the pharmacy advise?)  Preferred Pharmacy (with phone number or street name): [facility]: Please be advised that RX refills may take up to 3 business days. We ask that you follow-up with your pharmacy.

## 2021-01-14 NOTE — Telephone Encounter (Signed)
Requested medication (s) are due for refill today: yes  Requested medication (s) are on the active medication list: yes  Last refill:  10/24/20  Future visit scheduled: yes  Notes to clinic: no assigned protocol    Requested Prescriptions  Pending Prescriptions Disp Refills   permethrin (ELIMITE) 5 % cream 60 g 2    Sig: Apply to entire body other than face - let sit for 14 hours then wash off, may repeat in 1 week if still having symptoms      Off-Protocol Failed - 01/14/2021 10:13 AM      Failed - Medication not assigned to a protocol, review manually.      Passed - Valid encounter within last 12 months    Recent Outpatient Visits           2 months ago Scabies infestation   [place] [name], NP   4 months ago Special screening for malignant neoplasms, colon   [place] [name], NP   5 months ago Screening for diabetes mellitus   [place] [name], NP       Future Appointments             In 1 month Oletta Lamas, Milford Cage, NP [place]

## 2021-01-15 ENCOUNTER — Ambulatory Visit (INDEPENDENT_AMBULATORY_CARE_PROVIDER_SITE_OTHER): Payer: Self-pay | Admitting: *Deleted

## 2021-01-15 NOTE — Telephone Encounter (Signed)
Pt reports worsening scabies. States dx 10/24/20. Has used Ellimite ,"Helps for a week or so, comes back."   Mostly on both legs, severe itching, keeps awake at night. "Really interferes with my life."  Pt states he has been scratching, some bleeding noted. Also reports "Maybe a small amount of pus at places." Reports chilss, unsure if febrile. Reports some swelling of both legs. Advised UC. States will follow disposition "But I can't go until tomorrow." Assured pt nT would route to practice for PCPs review. Reiterated need for UC/ED eval today. Pt verbalizes understanding. CB# [phone]  Reason for Disposition . [1] Looks infected (spreading redness, pus) AND [2] large red area (> 2 in. or 5 cm)  Answer Assessment - Initial Assessment Questions 1. DIAGNOSIS CONFIRMATION: "Who diagnosed your scabies?" "When?"      PCP 2. ONSET:  "When did the scabies rash begin?"     JAn 2022 3. LOCATION: "Where do you have the scabies rash?" Both legs     3. SYMPTOMS: "What symptoms are you most concerned about today?"       Worsening, itch, "Small amount looks like pus." 4. ITCHING: "How bad is the itching?        - MILD - doesn't interfere with normal activities       - MODERATE-SEVERE: interferes with work, school, sleep, or other activities     Severe 5. INFECTION: "Does rash look infected?" (e.g., spreading redness, tenderness, pus)      "A little pus" 6. TREATMENT: "What medicine(s) were you prescribed?" "What's happened since you took or applied the medicine?"     Helped for a week or so, comes back. 7. CLOSE CONTACTS: "Does any person who lives with you or has had close contact with you have itching?" If Yes, ask: "Was the person(s) treated for scabies?"    None  Protocols used: SCABIES FOLLOW-UP CALL-A-AH

## 2021-01-20 ENCOUNTER — Other Ambulatory Visit (INDEPENDENT_AMBULATORY_CARE_PROVIDER_SITE_OTHER): Payer: Self-pay | Admitting: Primary Care

## 2021-01-20 DIAGNOSIS — B86 Scabies: Secondary | ICD-10-CM

## 2021-01-20 NOTE — Telephone Encounter (Signed)
   Notes to clinic: Medication not assigned to a protocol, review manually Patient states he has ran out of this cream   Requested Prescriptions  Pending Prescriptions Disp Refills   permethrin (ELIMITE) 5 % cream 60 g 2    Sig: Apply to entire body other than face - let sit for 14 hours then wash off, may repeat in 1 week if still having symptoms      Off-Protocol Failed - 01/20/2021  2:41 PM      Failed - Medication not assigned to a protocol, review manually.      Passed - Valid encounter within last 12 months    Recent Outpatient Visits           2 months ago Scabies infestation   [place] [name], NP   4 months ago Special screening for malignant neoplasms, colon   [place] [name], NP   6 months ago Screening for diabetes mellitus   [place], [place], NP       Future Appointments             In 1 month Oletta Lamas, Milford Cage, NP [place]

## 2021-01-20 NOTE — Telephone Encounter (Signed)
Medication Refill - Medication:   permethrin (ELIMITE) 5 % cream   Has the patient contacted their pharmacy? Yes.  stated he ran out of refills.  (Agent: If no, request that the patient contact the pharmacy for the refill.) (Agent: If yes, when and what did the pharmacy advise?)  Preferred Pharmacy (with phone number or street name):   [id number] [place], [place] - [address]  [address] [place] [address]  Phone: [phone] Fax: [phone]    Agent: Please be advised that RX refills may take up to 3 business days. We ask that you follow-up with your pharmacy.

## 2021-02-26 ENCOUNTER — Telehealth (INDEPENDENT_AMBULATORY_CARE_PROVIDER_SITE_OTHER): Payer: Self-pay | Admitting: Primary Care

## 2021-02-26 ENCOUNTER — Institutional Professional Consult (permissible substitution): Payer: Self-pay | Admitting: Emergency Medicine

## 2021-02-26 NOTE — Telephone Encounter (Signed)
Called patient and LVM advising that his upcoming appointment on Monday 5/23 has been cancelled due to the provider being out of the office. Advised patient to call [phone] to reschedule that appointment.

## 2021-03-02 ENCOUNTER — Ambulatory Visit (INDEPENDENT_AMBULATORY_CARE_PROVIDER_SITE_OTHER): Payer: Self-pay | Admitting: Family Medicine

## 2021-03-11 ENCOUNTER — Encounter: Payer: Self-pay | Admitting: Pulmonary Disease

## 2021-03-11 ENCOUNTER — Ambulatory Visit (INDEPENDENT_AMBULATORY_CARE_PROVIDER_SITE_OTHER): Payer: Self-pay | Admitting: Pulmonary Disease

## 2021-03-11 ENCOUNTER — Other Ambulatory Visit: Payer: Self-pay

## 2021-03-11 VITALS — BP 120/80 | HR 107 | Temp 98.0°F | Ht 68.0 in | Wt 154.0 lb

## 2021-03-11 DIAGNOSIS — R0600 Dyspnea, unspecified: Secondary | ICD-10-CM

## 2021-03-11 DIAGNOSIS — R918 Other nonspecific abnormal finding of lung field: Secondary | ICD-10-CM

## 2021-03-11 DIAGNOSIS — R5383 Other fatigue: Secondary | ICD-10-CM

## 2021-03-11 DIAGNOSIS — Z572 Occupational exposure to dust: Secondary | ICD-10-CM

## 2021-03-11 DIAGNOSIS — R0609 Other forms of dyspnea: Secondary | ICD-10-CM

## 2021-03-11 DIAGNOSIS — Z87891 Personal history of nicotine dependence: Secondary | ICD-10-CM

## 2021-03-11 MED ORDER — ALBUTEROL SULFATE HFA 108 (90 BASE) MCG/ACT IN AERS
2.0000 | INHALATION_SPRAY | Freq: Four times a day (QID) | RESPIRATORY_TRACT | 2 refills | Status: DC | PRN
  Filled 2021-06-04: qty 8.5, 25d supply, fill #0
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
## 2021-03-11 NOTE — Patient Instructions (Addendum)
Thank you for visiting Dr. Valeta Harms at [facility]. Today we recommend the following:  Orders Placed This Encounter  Procedures  . CT Super D Chest Wo Contrast  . Pulmonary Function Test   We will go over your pfts at next visit  CT chest in August 2022  Return in about 4 weeks (around 04/08/2021) for with APP or Dr. Valeta Harms.    Please do your part to reduce the spread of COVID-19.

## 2021-03-16 ENCOUNTER — Emergency Department (HOSPITAL_COMMUNITY): Admission: EM | Admit: 2021-03-16 | Discharge: 2021-03-16 | Discharge status: Against Medical Advice | Payer: Self-pay

## 2021-03-16 ENCOUNTER — Other Ambulatory Visit: Payer: Self-pay

## 2021-03-17 ENCOUNTER — Other Ambulatory Visit: Payer: Self-pay

## 2021-03-17 ENCOUNTER — Ambulatory Visit (HOSPITAL_COMMUNITY)
Admission: EM | Admit: 2021-03-17 | Discharge: 2021-03-17 | Disposition: A | Discharge status: Home / Self Care | Payer: Self-pay | Attending: Physician Assistant | Admitting: Physician Assistant

## 2021-03-17 ENCOUNTER — Ambulatory Visit (INDEPENDENT_AMBULATORY_CARE_PROVIDER_SITE_OTHER): Payer: Self-pay

## 2021-03-17 ENCOUNTER — Encounter (HOSPITAL_COMMUNITY): Payer: Self-pay

## 2021-03-17 DIAGNOSIS — M79645 Pain in left finger(s): Secondary | ICD-10-CM

## 2021-03-17 DIAGNOSIS — M79642 Pain in left hand: Secondary | ICD-10-CM

## 2021-03-17 MED ORDER — MELOXICAM 15 MG PO TABS: 15.0000 mg | ORAL_TABLET | Freq: Every day | ORAL | 0 refills | Status: DC

## 2021-03-17 NOTE — ED Triage Notes (Signed)
Pt presents with left hand & thumb injury from working on his deck 2 days ago.

## 2021-03-17 NOTE — Discharge Instructions (Addendum)
Xray negative for fracture. Start Mobic. Do not take ibuprofen (motrin/advil)/ naproxen (aleve) while on mobic. Ice compress. Thumb spica for comfort. Follow up with hand orthopedics if symptoms not improving in 1-2 weeks.

## 2021-03-17 NOTE — ED Provider Notes (Signed)
[place]    CSN: [id number] Arrival date & time: 03/17/21  1633      History   Chief Complaint Chief Complaint  Patient presents with  . Hand Injury    HPI Connor Solomon is a 60 y.o. male.   60 year old male comes in for 2 day history of left thumb/hand pain after injury. States while working on his deck, got his finger caught/pinned flexed. Since then has had swelling, pain with thumb movement. Denies numbness/tingling. States history of wrist/thumb injury with surgery 10 years ago with residual limited ROM of the thumb, and does not feel that this has changed.      Past Medical History:  Diagnosis Date  . Arthritis    hands, elbows bilaterally  . Full dentures   . Hypertension    Pt states he doen not have HTN and has never been treated for HTN  . Kidney stone on left side last stone 4-5 yrs ago   recurrent (3 episodes)  . Panic disorder    pt states has resolved  . Syncope    resolved- was related to panic attacks    Patient Active Problem List   Diagnosis Date Noted  . Injury of middle finger 06/09/2011  . INSOMNIA 11/17/2009  . WRIST PAIN, LEFT 10/14/2009  . PANIC DISORDER 09/22/2009  . HYPERTENSION, BENIGN ESSENTIAL 09/22/2009    Past Surgical History:  Procedure Laterality Date  . CARPAL METACARPAL FUSION WITH DISTAL RADIAL BONE GRAFT Left 05/30/2013   Procedure: LEFT THUMB METACARPAL JOINT FUSION;  Surgeon: Schuyler Amor, MD;  Location: [place];  Service: Orthopedics;  Laterality: Left;  . ESOPHAGOGASTRODUODENOSCOPY N/A 02/12/2014   Procedure: ESOPHAGOGASTRODUODENOSCOPY (EGD);  Surgeon: Jerene Bears, MD;  Location: [facility] END[facility]COPY;  Service: Endoscopy;  Laterality: N/A;  . FOREIGN BODY REMOVAL N/A 02/12/2014   Procedure: FOREIGN BODY REMOVAL;  Surgeon: Jerene Bears, MD;  Location: [place];  Service: Endoscopy;  Laterality: N/A;  . OPEN REDUCTION INTERNAL FIXATION (ORIF) DISTAL RADIAL FRACTURE Left 11/29/2012   Procedure:  OPEN REDUCTION INTERNAL FIXATION (ORIF) DISTAL RADIAL FRACTURE;  Surgeon: Schuyler Amor, MD;  Location: [place];  Service: Orthopedics;  Laterality: Left;  LEFT DISTAL RADIUS OSTEOTOMY WITH BONE GRAFT  . TENDON REPAIR Left 02/07/2013   Procedure: LEFT EXTENSOR INDICIS PROPRIUS  TO EXTENSOR POLLICIS LONGUS TENDON TRANSFER;  Surgeon: Schuyler Amor, MD;  Location: [place];  Service: Orthopedics;  Laterality: Left;  . WISDOM TOOTH EXTRACTION    . WRIST SURGERY     fx only       Home Medications    Prior to Admission medications   Medication Sig Start Date End Date Taking? Authorizing Provider  meloxicam (MOBIC) 15 MG tablet Take 1 tablet (15 mg total) by mouth daily. 03/17/21  Yes Olajuwon Fosdick V, PA-C  albuterol (VENTOLIN HFA) 108 (90 Base) MCG/ACT inhaler Inhale 2 puffs into the lungs every 6 (six) hours as needed for wheezing or shortness of breath. 03/11/21   Icard, Octavio Graves, DO  hydrochlorothiazide (HYDRODIURIL) 25 MG tablet Take 1 tablet (25 mg total) by mouth daily. Patient not taking: Reported on 03/11/2021 09/02/20   [name], NP  Iron, Ferrous Sulfate, 325 (65 Fe) MG TABS Take 325 mg by mouth 1 day or 1 dose for 1 dose. 07/23/20 07/24/20  [name], NP  permethrin (ELIMITE) 5 % cream Apply to entire body other than face - let sit  for 14 hours then wash off, may repeat in 1 week if still having symptoms Patient not taking: Reported on 03/11/2021 10/24/20   [name], NP    Family History Family History  Problem Relation Age of Onset  . Heart attack Father   . Diabetes Neg Hx   . Hyperlipidemia Neg Hx   . Hypertension Neg Hx     Social History Social History   Tobacco Use  . Smoking status: Former Smoker    Years: 3.00    Types: Cigars    Quit date: 10/11/2020    Years since quitting: 0.4  . Smokeless tobacco: Never Used  . Tobacco comment: smokes 1 cigar a week  Substance Use Topics  . Alcohol use: No  .  Drug use: No     Allergies   Hydrocodone   Review of Systems Review of Systems  Reason unable to perform ROS: See HPI as above.     Physical Exam Triage Vital Signs ED Triage Vitals  Enc Vitals Group     BP 03/17/21 1730 130/72     Pulse Rate 03/17/21 1730 99     Resp 03/17/21 1730 20     Temp 03/17/21 1730 98.8 F (37.1 C)     Temp Source 03/17/21 1730 Oral     SpO2 03/17/21 1730 99 %     Weight --      Height --      Head Circumference --      Peak Flow --      Pain Score 03/17/21 1731 7     Pain Loc --      Pain Edu? --      Excl. in [place]? --    No data found.  Updated Vital Signs BP 130/72 (BP Location: Right Arm)   Pulse 99   Temp 98.8 F (37.1 C) (Oral)   Resp 20   SpO2 99%   Physical Exam Constitutional:      General: He is not in acute distress.    Appearance: Normal appearance. He is well-developed. He is not toxic-appearing or diaphoretic.  HENT:     Head: Normocephalic and atraumatic.  Eyes:     Conjunctiva/sclera: Conjunctivae normal.     Pupils: Pupils are equal, round, and reactive to light.  Pulmonary:     Effort: Pulmonary effort is normal. No respiratory distress.  Musculoskeletal:     Cervical back: Normal range of motion and neck supple.     Comments: Swelling along the 1st MCP of left hand without contusion, open wound noted. Tenderness to palpation along 1st MCP with max tender to distal MCP. No tenderness along the digit. No tenderness to palpation of the wrist. FROM of wrist. Decreased ROM of thumb from baseline and due to pain. NVI  Skin:    General: Skin is warm and dry.  Neurological:     Mental Status: He is alert and oriented to person, place, and time.    UC Treatments / Results  Labs (all labs ordered are listed, but only abnormal results are displayed) Labs Reviewed - No data to display  EKG   Radiology DG Finger Thumb Left  Result Date: 03/17/2021 CLINICAL DATA:  Leary Roca more deck fell on thumb. Remote history of  thumb surgery. EXAM: LEFT THUMB 2+V COMPARISON:  None. FINDINGS: Chronic changes from previous fusion of the first MCP joint. No signs of acute fracture the or dislocation. Soft tissues are unremarkable. IMPRESSION: No acute findings. Chronic changes from  previous fusion of the first MCP joint. Electronically Signed   By: Kerby Moors M.D.   On: 03/17/2021 18:10    Procedures Procedures (including critical care time)  Medications Ordered in UC Medications - No data to display  Initial Impression / Assessment and Plan / UC Course  I have reviewed the triage vital signs and the nursing notes.  Pertinent labs & imaging results that were available during my care of the patient were reviewed by me and considered in my medical decision making (see chart for details).    Discussed xray results with patient. Will place thumb spice for comfort. Mobic, ice, rest. Expected course of healing discussed. Ortho follow up if needed. Return precautions given.  Final Clinical Impressions(s) / UC Diagnoses   Final diagnoses:  Left hand pain   ED Prescriptions    Medication Sig Dispense Auth. Provider   meloxicam (MOBIC) 15 MG tablet Take 1 tablet (15 mg total) by mouth daily. 20 tablet Ok Edwards, PA-C     I have reviewed the PDMP during this encounter.   Ok Edwards, PA-C 03/17/21 1843

## 2021-03-25 ENCOUNTER — Encounter (INDEPENDENT_AMBULATORY_CARE_PROVIDER_SITE_OTHER): Payer: Self-pay | Admitting: Primary Care

## 2021-03-25 ENCOUNTER — Other Ambulatory Visit: Payer: Self-pay

## 2021-03-25 ENCOUNTER — Ambulatory Visit (INDEPENDENT_AMBULATORY_CARE_PROVIDER_SITE_OTHER): Payer: Self-pay | Admitting: Primary Care

## 2021-03-25 VITALS — BP 131/73 | HR 88 | Temp 97.5°F | Resp 20 | Wt 154.0 lb

## 2021-03-25 DIAGNOSIS — D649 Anemia, unspecified: Secondary | ICD-10-CM

## 2021-03-25 DIAGNOSIS — I10 Essential (primary) hypertension: Secondary | ICD-10-CM

## 2021-03-25 NOTE — Progress Notes (Signed)
BP follow up  Fatigue  Work note  to work 6 hours

## 2021-03-25 NOTE — Patient Instructions (Signed)
Iron Deficiency Anemia, Adult Iron deficiency anemia is when you do not have enough red blood cells or hemoglobin in your blood. This happens because you have too little iron in your body. Hemoglobin carries oxygen to parts of the body. Anemia can cause yourbody to not get enough oxygen. What are the causes? Not eating enough foods that have iron in them. The body not being able to take in iron well. Needing more iron due to pregnancy or heavy menstrual periods, for females. Cancer. Bleeding in the bowels. Many blood draws. What increases the risk? Being pregnant. Being a teenage girl going through a growth spurt. What are the signs or symptoms? Pale skin, lips, and nails. Weakness, dizziness, and getting tired easily. Headache. Feeling like you cannot breathe well when moving (shortness of breath). Cold hands and feet. Fast heartbeat or a heartbeat that is not regular. Feeling grouchy (irritable) or breathing fast. These are more common in very bad anemia. Mild anemia may not cause any symptoms. How is this treated? This condition is treated by finding out why you do not have enough iron and then getting more iron. It may include: Adding foods to your diet that have a lot of iron. Taking iron pills (supplements). If you are pregnant or breastfeeding, you may need to take extra iron. Your diet often does not provide the amount of iron that you need. Getting more vitamin C in your diet. Vitamin C helps your body take in iron. You may need to take iron pills with a glass of orange juice or vitamin C pills. Medicines to make heavy menstrual periods lighter. Surgery. You may need blood tests to see if treatment is working. If the treatment doesnot seem to be working, you may need more tests. Follow these instructions at home: Medicines Take over-the-counter and prescription medicines only as told by your doctor. This includes iron pills and vitamins. Take iron pills when your stomach is  empty. If you cannot handle this, take them with food. Do not drink milk or take antacids at the same time as your iron pills. Iron pills may turn your poop (stool)black. If you cannot handle taking iron pills by mouth, ask your doctor about getting iron through: An IV tube. A shot (injection) into a muscle. Eating and drinking  Talk with your doctor before changing the foods you eat. He or she may tell you to eat foods that have a lot of iron, such as: Liver. Low-fat (lean) beef. Breads and cereals that have iron added to them. Eggs. Dried fruit. Dark green, leafy vegetables. Eat fresh fruits and vegetables that are high in vitamin C. They help your body use iron. Foods with a lot of vitamin C include: Oranges. Peppers. Tomatoes. Mangoes. Drink enough fluid to keep your pee (urine) pale yellow.  Managing constipation If you are taking iron pills, they may cause trouble pooping (constipation). To prevent or treat trouble pooping, you may need to: Take over-the-counter or prescription medicines. Eat foods that are high in fiber. These include beans, whole grains, and fresh fruits and vegetables. Limit foods that are high in fat and sugar. These include fried or sweet foods. General instructions Return to your normal activities as told by your doctor. Ask your doctor what activities are safe for you. Keep yourself clean, and keep things clean around you. Keep all follow-up visits as told by your doctor. This is important. Contact a doctor if: You feel like you may vomit (nauseous), or you vomit. You feel   weak. You are sweating for no reason. You have trouble pooping, such as: Pooping less than 3 times a week. Straining to poop. Having poop that is hard, dry, or larger than normal. Feeling full or bloated. Pain in the lower belly. Not feeling better after pooping. Get help right away if: You pass out (faint). You have chest pain. You have trouble breathing that: Is very  bad. Gets worse with physical activity. You have a fast heartbeat, or a heartbeat that does not feel regular. You get light-headed when getting up from sitting or lying down. These symptoms may be an emergency. Do not wait to see if the symptoms will go away. Get medical help right away. Call your local emergency services (911 in the [place] Do not drive yourself to the hospital. Summary Iron deficiency anemia is when you have too little iron in your body. This condition is treated by finding out why you do not have enough iron in your body and then getting more iron. Take over-the-counter and prescription medicines only as told by your doctor. Eat fresh fruits and vegetables that are high in vitamin C. Get help right away if you cannot breathe well. This information is not intended to replace advice given to you by your health care provider. Make sure you discuss any questions you have with your healthcare provider. Document Revised: 06/05/2019 Document Reviewed: 06/05/2019 Elsevier Patient Education  2022 Elsevier Inc.  

## 2021-03-26 LAB — CBC WITH DIFFERENTIAL/PLATELET
Basophils Absolute: 0 10*3/uL (ref 0.0–0.2)
Basos: 0 %
EOS (ABSOLUTE): 0.7 10*3/uL — ABNORMAL HIGH (ref 0.0–0.4)
Eos: 7 %
Hematocrit: 27.2 % — ABNORMAL LOW (ref 37.5–51.0)
Hemoglobin: 8.6 g/dL — ABNORMAL LOW (ref 13.0–17.7)
Immature Grans (Abs): 0.1 10*3/uL (ref 0.0–0.1)
Immature Granulocytes: 1 %
Lymphocytes Absolute: 1.7 10*3/uL (ref 0.7–3.1)
Lymphs: 17 %
MCH: 24.3 pg — ABNORMAL LOW (ref 26.6–33.0)
MCHC: 31.6 g/dL (ref 31.5–35.7)
MCV: 77 fL — ABNORMAL LOW (ref 79–97)
Monocytes Absolute: 0.9 10*3/uL (ref 0.1–0.9)
Monocytes: 9 %
Neutrophils Absolute: 6.8 10*3/uL (ref 1.4–7.0)
Neutrophils: 66 %
Platelets: 454 10*3/uL — ABNORMAL HIGH (ref 150–450)
RBC: 3.54 x10E6/uL — ABNORMAL LOW (ref 4.14–5.80)
RDW: 15.5 % — ABNORMAL HIGH (ref 11.6–15.4)
WBC: 10.1 10*3/uL (ref 3.4–10.8)

## 2021-03-27 ENCOUNTER — Other Ambulatory Visit (INDEPENDENT_AMBULATORY_CARE_PROVIDER_SITE_OTHER): Payer: Self-pay | Admitting: Primary Care

## 2021-03-27 DIAGNOSIS — D649 Anemia, unspecified: Secondary | ICD-10-CM

## 2021-03-27 MED ORDER — FERROUS SULFATE 300 (60 FE) MG/5ML PO SYRP
300.0000 mg | ORAL_SOLUTION | Freq: Every day | ORAL | 3 refills | Status: DC
Start: 2021-03-27 — End: 2021-05-22

## 2021-03-27 NOTE — Progress Notes (Signed)
Left message on voicemail per DPR. Encourage to call if they have additional questions or concerns.

## 2021-04-01 NOTE — Progress Notes (Signed)
Connor   SADIEL Solomon is a 60 y.o. male presents for hypertension evaluation, Denies shortness of breath, headaches, chest pain or lower extremity edema, sudden onset, vision changes, unilateral weakness, dizziness, paresthesias   Patient reports adherence with medications.  Dietary habits include: monitoring sodium intake Exercise habits include:walking Family / Social history: no   Past Medical History:  Diagnosis Date  . Arthritis    hands, elbows bilaterally  . Full dentures   . Hypertension    Pt states he doen not have HTN and has never been treated for HTN  . Kidney stone on left side last stone 4-5 yrs ago   recurrent (3 episodes)  . Panic disorder    pt states has resolved  . Syncope    resolved- was related to panic attacks   Past Surgical History:  Procedure Laterality Date  . CARPAL METACARPAL FUSION WITH DISTAL RADIAL BONE GRAFT Left 05/30/2013   Procedure: LEFT THUMB METACARPAL JOINT FUSION;  Surgeon: Schuyler Amor, MD;  Location: [place];  Service: Orthopedics;  Laterality: Left;  . ESOPHAGOGASTRODUODENOSCOPY N/A 02/12/2014   Procedure: ESOPHAGOGASTRODUODENOSCOPY (EGD);  Surgeon: Jerene Bears, MD;  Location: [facility] ENDOSCOPY;  Service: Endoscopy;  Laterality: N/A;  . FOREIGN BODY REMOVAL N/A 02/12/2014   Procedure: FOREIGN BODY REMOVAL;  Surgeon: Jerene Bears, MD;  Location: [place];  Service: Endoscopy;  Laterality: N/A;  . OPEN REDUCTION INTERNAL FIXATION (ORIF) DISTAL RADIAL FRACTURE Left 11/29/2012   Procedure: OPEN REDUCTION INTERNAL FIXATION (ORIF) DISTAL RADIAL FRACTURE;  Surgeon: Schuyler Amor, MD;  Location: [place];  Service: Orthopedics;  Laterality: Left;  LEFT DISTAL RADIUS OSTEOTOMY WITH BONE GRAFT  . TENDON REPAIR Left 02/07/2013   Procedure: LEFT EXTENSOR INDICIS PROPRIUS  TO EXTENSOR POLLICIS LONGUS TENDON TRANSFER;  Surgeon: Schuyler Amor, MD;  Location: [place];  Service: Orthopedics;  Laterality: Left;  . WISDOM TOOTH EXTRACTION    . WRIST SURGERY     fx only   Allergies  Allergen Reactions  . Hydrocodone Itching and Nausea And Vomiting   Current Outpatient Medications on File Prior to Visit  Medication Sig Dispense Refill  . albuterol (VENTOLIN HFA) 108 (90 Base) MCG/ACT inhaler Inhale 2 puffs into the lungs every 6 (six) hours as needed for wheezing or shortness of breath. 8 g 2  . hydrochlorothiazide (HYDRODIURIL) 25 MG tablet Take 1 tablet (25 mg total) by mouth daily. 90 tablet 0   No current facility-administered medications on file prior to visit.   Social History   Socioeconomic History  . Marital status: Single    Spouse name: Not on file  . Number of children: Not on file  . Years of education: Not on file  . Highest education level: Not on file  Occupational History  . Not on file  Tobacco Use  . Smoking status: Former    Pack years: 0.00    Types: Cigars    Quit date: 10/11/2020    Years since quitting: 0.4  . Smokeless tobacco: Never  . Tobacco comments:    smokes 1 cigar a week  Substance and Sexual Activity  . Alcohol use: No  . Drug use: No  . Sexual activity: Not on file  Other Topics Concern  . Not on file  Social History Narrative  . Not on file   Social Determinants of Health   Financial Resource Strain: Not on file  Food Insecurity: Not on file  Transportation Needs: Not on file  Physical Activity: Not on file  Stress: Not on file  Social Connections: Not on file  Intimate Partner Violence: Not on file   Family History  Problem Relation Age of Onset  . Heart attack Father   . Diabetes Neg Hx   . Hyperlipidemia Neg Hx   . Hypertension Neg Hx      OBJECTIVE:  Vitals:   03/25/21 1606  BP: 131/73  Pulse: 88  Resp: 20  Temp: (!) 97.5 F (36.4 C)  SpO2: 99%  Weight: 154 lb (69.9 kg)    Physical Exam Vitals reviewed.  HENT:     Head: Normocephalic.     Right Ear: Tympanic  membrane and external ear normal.     Left Ear: Tympanic membrane and external ear normal.     Nose: Nose normal.  Eyes:     Extraocular Movements: Extraocular movements intact.     Pupils: Pupils are equal, round, and reactive to light.  Cardiovascular:     Rate and Rhythm: Normal rate and regular rhythm.     Pulses: Normal pulses.     Heart sounds: Normal heart sounds.  Pulmonary:     Effort: Pulmonary effort is normal.     Breath sounds: Normal breath sounds.  Abdominal:     General: Bowel sounds are normal.     Palpations: Abdomen is soft.  Musculoskeletal:        General: Normal range of motion.     Cervical back: Normal range of motion and neck supple.  Skin:    General: Skin is warm and dry.  Neurological:     Mental Status: He is alert and oriented to person, place, and time.  Psychiatric:        Mood and Affect: Mood normal.        Behavior: Behavior normal.        Thought Content: Thought content normal.        Judgment: Judgment normal.     Review of Systems  Constitutional:  Positive for malaise/fatigue.  All other systems reviewed and are negative.  Last 3 Office BP readings: BP Readings from Last 3 Encounters:  03/25/21 131/73  03/17/21 130/72  03/11/21 120/80    BMET    Component Value Date/Time   NA 136 05/31/2020 0508   K 3.9 05/31/2020 0508   CL 102 05/31/2020 0508   CO2 24 05/31/2020 0508   GLUCOSE 108 (H) 05/31/2020 0508   BUN 8 05/31/2020 0508   CREATININE 0.78 05/31/2020 0508   CALCIUM 9.2 05/31/2020 0508   GFRNONAA >60 05/31/2020 0508   GFRAA >60 05/31/2020 4580    Renal function: CrCl cannot be calculated (Patient's most recent lab result is older than the maximum 21 days allowed.).  Clinical ASCVD: No  The ASCVD Risk score Mikey Bussing DC Jr., et al., 2013) failed to calculate for the following reasons:   Cannot find a previous HDL lab   Cannot find a previous total cholesterol lab  ASCVD risk factors include- [place]   ASSESSMENT &  PLAN:  Cedar was seen today for hypertension.  Diagnoses and all orders for this visit:  Anemia, unspecified type Anemia is a condition in which you lack enough healthy red blood cells to carry adequate oxygen to your body's tissues. Having anemia can make you feel tired and weak. Anemia can be temporary or long term, and it can range from mild to severe  -     CBC with Differential  Essential hypertension Blood pressure goal of less than 130/80,which is continue low-sodium, DASH diet, medication compliance, 150 minutes of moderate intensity exercise per week. Discussed medication compliance, adverse effects.     No orders of the defined types were placed in this encounter.   -Counseled on lifestyle modifications for blood pressure control including reduced dietary sodium, increased exercise, weight reduction and adequate sleep. Also, educated patient about the risk for cardiovascular events, stroke and heart attack. Also counseled patient about the importance of medication adherence. If you participate in smoking, it is important to stop using tobacco as this will increase the risks associated with uncontrolled blood pressure.   -Hypertension longstanding diagnosed currently HCTZ $RemoveBefore'25mg'[id number]$   on current medications. Patient is adherent with current medications.   Goal BP:  For patients younger than 60: Goal BP < 130/80. For patients 60 and older: Goal BP < 140/90. For patients with diabetes: Goal BP < 130/80. Your most recent BP: 131/73  Minimize salt intake. Minimize alcohol intake    This note has been created with Surveyor, quantity. Any transcriptional errors are unintentional.   [name], NP 04/01/2021, 9:18 PM

## 2021-04-06 ENCOUNTER — Ambulatory Visit: Payer: Self-pay

## 2021-04-06 NOTE — Telephone Encounter (Signed)
Reason for Disposition  Sounds like a life-threatening emergency to the triager  Answer Assessment - Initial Assessment Questions 1. RESPIRATORY STATUS: "Describe your breathing?" (e.g., wheezing, shortness of breath, unable to speak, severe coughing)      Shortness of breath 2. ONSET: "When did this breathing problem begin?"      Today 3. PATTERN "Does the difficult breathing come and go, or has it been constant since it started?"      Constant 4. SEVERITY: "How bad is your breathing?" (e.g., mild, moderate, severe)    - MILD: No SOB at rest, mild SOB with walking, speaks normally in sentences, can lie down, no retractions, pulse < 100.    - MODERATE: SOB at rest, SOB with minimal exertion and prefers to sit, cannot lie down flat, speaks in phrases, mild retractions, audible wheezing, pulse 100-120.    - SEVERE: Very SOB at rest, speaks in single words, struggling to breathe, sitting hunched forward, retractions, pulse > 120      Moderate 5. RECURRENT SYMPTOM: "Have you had difficulty breathing before?" If Yes, ask: "When was the last time?" and "What happened that time?"      Yes 6. CARDIAC HISTORY: "Do you have any history of heart disease?" (e.g., heart attack, angina, bypass surgery, angioplasty)      HTN 7. LUNG HISTORY: "Do you have any history of lung disease?"  (e.g., pulmonary embolus, asthma, emphysema)     I have a spot on my lung 8. CAUSE: "What do you think is causing the breathing problem?"      Unsure 9. OTHER SYMPTOMS: "Do you have any other symptoms? (e.g., dizziness, runny nose, cough, chest pain, fever)     Tightness 10. O2 SATURATION MONITOR:  "Do you use an oxygen saturation monitor (pulse oximeter) at home?" If Yes, "What is your reading (oxygen level) today?" "What is your usual oxygen saturation reading?" (e.g., 95%)       No 11. PREGNANCY: "Is there any chance you are pregnant?" "When was your last menstrual period?"       N/a 12. TRAVEL: "Have you traveled out  of the country in the last month?" (e.g., travel history, exposures)       No  Protocols used: Breathing Difficulty-A-AH

## 2021-04-06 NOTE — Telephone Encounter (Signed)
Pt. Reports started having shortness of breath this morning."It won't go away." "Can't talk without getting winded." Complaining of "chest tightness." Instructed to call 911 now. Verbalizes understanding. Wife is with pt.

## 2021-04-07 ENCOUNTER — Observation Stay (HOSPITAL_COMMUNITY): Payer: Medicaid Other

## 2021-04-07 ENCOUNTER — Inpatient Hospital Stay (HOSPITAL_COMMUNITY)
Admission: EM | Admit: 2021-04-07 | Discharge: 2021-04-10 | DRG: 286 | Disposition: A | Discharge status: Home / Self Care | Payer: Medicaid Other | Attending: Student in an Organized Health Care Education/Training Program | Admitting: Student in an Organized Health Care Education/Training Program

## 2021-04-07 ENCOUNTER — Emergency Department (HOSPITAL_COMMUNITY): Payer: Medicaid Other

## 2021-04-07 ENCOUNTER — Encounter (HOSPITAL_COMMUNITY): Payer: Self-pay

## 2021-04-07 DIAGNOSIS — I200 Unstable angina: Secondary | ICD-10-CM | POA: Diagnosis present

## 2021-04-07 DIAGNOSIS — Z87891 Personal history of nicotine dependence: Secondary | ICD-10-CM | POA: Diagnosis not present

## 2021-04-07 DIAGNOSIS — I428 Other cardiomyopathies: Secondary | ICD-10-CM | POA: Diagnosis present

## 2021-04-07 DIAGNOSIS — R42 Dizziness and giddiness: Secondary | ICD-10-CM | POA: Diagnosis not present

## 2021-04-07 DIAGNOSIS — I340 Nonrheumatic mitral (valve) insufficiency: Secondary | ICD-10-CM

## 2021-04-07 DIAGNOSIS — Z8249 Family history of ischemic heart disease and other diseases of the circulatory system: Secondary | ICD-10-CM | POA: Diagnosis not present

## 2021-04-07 DIAGNOSIS — I5021 Acute systolic (congestive) heart failure: Secondary | ICD-10-CM | POA: Diagnosis present

## 2021-04-07 DIAGNOSIS — D509 Iron deficiency anemia, unspecified: Secondary | ICD-10-CM | POA: Diagnosis not present

## 2021-04-07 DIAGNOSIS — R0609 Other forms of dyspnea: Secondary | ICD-10-CM

## 2021-04-07 DIAGNOSIS — T68XXXA Hypothermia, initial encounter: Secondary | ICD-10-CM | POA: Diagnosis not present

## 2021-04-07 DIAGNOSIS — I472 Ventricular tachycardia: Secondary | ICD-10-CM | POA: Diagnosis present

## 2021-04-07 DIAGNOSIS — R7989 Other specified abnormal findings of blood chemistry: Secondary | ICD-10-CM

## 2021-04-07 DIAGNOSIS — J188 Other pneumonia, unspecified organism: Secondary | ICD-10-CM

## 2021-04-07 DIAGNOSIS — R634 Abnormal weight loss: Secondary | ICD-10-CM | POA: Diagnosis not present

## 2021-04-07 DIAGNOSIS — I5023 Acute on chronic systolic (congestive) heart failure: Secondary | ICD-10-CM | POA: Diagnosis not present

## 2021-04-07 DIAGNOSIS — Z801 Family history of malignant neoplasm of trachea, bronchus and lung: Secondary | ICD-10-CM

## 2021-04-07 DIAGNOSIS — I10 Essential (primary) hypertension: Secondary | ICD-10-CM | POA: Diagnosis present

## 2021-04-07 DIAGNOSIS — R778 Other specified abnormalities of plasma proteins: Secondary | ICD-10-CM

## 2021-04-07 DIAGNOSIS — Z8616 Personal history of COVID-19: Secondary | ICD-10-CM | POA: Diagnosis not present

## 2021-04-07 DIAGNOSIS — D472 Monoclonal gammopathy: Secondary | ICD-10-CM | POA: Diagnosis present

## 2021-04-07 DIAGNOSIS — D649 Anemia, unspecified: Secondary | ICD-10-CM

## 2021-04-07 DIAGNOSIS — Z808 Family history of malignant neoplasm of other organs or systems: Secondary | ICD-10-CM

## 2021-04-07 DIAGNOSIS — Z28310 Unvaccinated for covid-19: Secondary | ICD-10-CM | POA: Diagnosis not present

## 2021-04-07 DIAGNOSIS — I110 Hypertensive heart disease with heart failure: Principal | ICD-10-CM | POA: Diagnosis present

## 2021-04-07 DIAGNOSIS — Z79899 Other long term (current) drug therapy: Secondary | ICD-10-CM

## 2021-04-07 DIAGNOSIS — F410 Panic disorder [episodic paroxysmal anxiety] without agoraphobia: Secondary | ICD-10-CM | POA: Diagnosis present

## 2021-04-07 DIAGNOSIS — D6489 Other specified anemias: Secondary | ICD-10-CM | POA: Diagnosis present

## 2021-04-07 DIAGNOSIS — I509 Heart failure, unspecified: Secondary | ICD-10-CM

## 2021-04-07 DIAGNOSIS — J189 Pneumonia, unspecified organism: Secondary | ICD-10-CM

## 2021-04-07 DIAGNOSIS — Z823 Family history of stroke: Secondary | ICD-10-CM

## 2021-04-07 DIAGNOSIS — D75838 Other thrombocytosis: Secondary | ICD-10-CM | POA: Diagnosis present

## 2021-04-07 DIAGNOSIS — R0789 Other chest pain: Secondary | ICD-10-CM | POA: Diagnosis not present

## 2021-04-07 DIAGNOSIS — R079 Chest pain, unspecified: Secondary | ICD-10-CM | POA: Diagnosis not present

## 2021-04-07 DIAGNOSIS — R0602 Shortness of breath: Secondary | ICD-10-CM | POA: Diagnosis not present

## 2021-04-07 HISTORY — DX: Acute systolic (congestive) heart failure: I50.21

## 2021-04-07 LAB — TROPONIN I (HIGH SENSITIVITY)
Troponin I (High Sensitivity): 32 ng/L — ABNORMAL HIGH (ref <18)
Troponin I (High Sensitivity): 30 ng/L — ABNORMAL HIGH (ref <18)
Troponin I (High Sensitivity): 23 ng/L — ABNORMAL HIGH (ref <18)

## 2021-04-07 LAB — CBC
HCT: 29.7 % — ABNORMAL LOW (ref 39.0–52.0)
Hemoglobin: 9 g/dL — ABNORMAL LOW (ref 13.0–17.0)
MCH: 23.7 pg — ABNORMAL LOW (ref 26.0–34.0)
MCHC: 30.3 g/dL (ref 30.0–36.0)
MCV: 78.2 fL — ABNORMAL LOW (ref 80.0–100.0)
Platelets: 459 10*3/uL — ABNORMAL HIGH (ref 150–400)
RBC: 3.8 MIL/uL — ABNORMAL LOW (ref 4.22–5.81)
RDW: 17.1 % — ABNORMAL HIGH (ref 11.5–15.5)
WBC: 8 10*3/uL (ref 4.0–10.5)
nRBC: 0 % (ref 0.0–0.2)

## 2021-04-07 LAB — ECHOCARDIOGRAM COMPLETE
Area-P 1/2: 5.31 cm2
Calc EF: 29.7 %
Height: 68 in
MV M vel: 5.21 m/s
MV Peak grad: 108.6 mmHg
Radius: 0.4 cm
S' Lateral: 5.6 cm
Single Plane A2C EF: 28.3 %
Single Plane A4C EF: 29.3 %
Weight: 2464 oz

## 2021-04-07 LAB — RESP PANEL BY RT-PCR (FLU A&B, COVID) ARPGX2
Influenza A by PCR: NEGATIVE
Influenza B by PCR: NEGATIVE
SARS Coronavirus 2 by RT PCR: NEGATIVE

## 2021-04-07 LAB — URINALYSIS, ROUTINE W REFLEX MICROSCOPIC
Bilirubin Urine: NEGATIVE
Glucose, UA: NEGATIVE mg/dL
Hgb urine dipstick: NEGATIVE
Ketones, ur: NEGATIVE mg/dL
Leukocytes,Ua: NEGATIVE
Nitrite: NEGATIVE
Protein, ur: NEGATIVE mg/dL
Specific Gravity, Urine: 1.005 (ref 1.005–1.030)
pH: 9 — ABNORMAL HIGH (ref 5.0–8.0)

## 2021-04-07 LAB — BASIC METABOLIC PANEL
Anion gap: 14 (ref 5–15)
BUN: 7 mg/dL (ref 6–20)
CO2: 22 mmol/L (ref 22–32)
Calcium: 8.8 mg/dL — ABNORMAL LOW (ref 8.9–10.3)
Chloride: 99 mmol/L (ref 98–111)
Creatinine, Ser: 0.75 mg/dL (ref 0.61–1.24)
GFR, Estimated: >60 mL/min (ref >60)
Glucose, Bld: 95 mg/dL (ref 70–99)
Potassium: 3.7 mmol/L (ref 3.5–5.1)
Sodium: 135 mmol/L (ref 135–145)

## 2021-04-07 LAB — D-DIMER, QUANTITATIVE: D-Dimer, Quant: 1.2 ug/mL-FEU — ABNORMAL HIGH (ref 0.00–0.50)

## 2021-04-07 LAB — BRAIN NATRIURETIC PEPTIDE: B Natriuretic Peptide: 380.4 pg/mL — ABNORMAL HIGH (ref 0.0–100.0)

## 2021-04-07 MED ORDER — ACETAMINOPHEN 325 MG PO TABS
650.0000 mg | ORAL_TABLET | Freq: Four times a day (QID) | ORAL | Status: DC | PRN
  Administered 2021-04-08: 650 mg via ORAL
  Filled 2021-04-07: qty 2

## 2021-04-07 MED ORDER — IPRATROPIUM-ALBUTEROL 0.5-2.5 (3) MG/3ML IN SOLN: 3.0000 mL | Freq: Once | RESPIRATORY_TRACT | Status: DC

## 2021-04-07 MED ORDER — NITROGLYCERIN 0.4 MG SL SUBL
0.4000 mg | SUBLINGUAL_TABLET | SUBLINGUAL | Status: AC | PRN
Start: 2021-04-07 — End: 2021-04-07
  Administered 2021-04-07: 0.4 mg via SUBLINGUAL
  Filled 2021-04-07: qty 1

## 2021-04-07 MED ORDER — SODIUM CHLORIDE 0.9 % IV SOLN
1.0000 g | Freq: Once | INTRAVENOUS | Status: AC
  Administered 2021-04-07: 1 g via INTRAVENOUS
  Filled 2021-04-07: qty 10

## 2021-04-07 MED ORDER — ENOXAPARIN SODIUM 40 MG/0.4ML IJ SOSY
40.0000 mg | PREFILLED_SYRINGE | INTRAMUSCULAR | Status: DC
  Administered 2021-04-07 – 2021-04-08 (×2): 40 mg via SUBCUTANEOUS
  Filled 2021-04-07 (×2): qty 0.4

## 2021-04-07 MED ORDER — ASPIRIN 81 MG PO CHEW
324.0000 mg | CHEWABLE_TABLET | Freq: Once | ORAL | Status: AC
  Administered 2021-04-07: 324 mg via ORAL
  Filled 2021-04-07: qty 4

## 2021-04-07 MED ORDER — ADULT MULTIVITAMIN W/MINERALS CH
1.0000 | ORAL_TABLET | Freq: Every day | ORAL | Status: DC
  Administered 2021-04-07 – 2021-04-10 (×4): 1 via ORAL
  Filled 2021-04-07 (×4): qty 1

## 2021-04-07 MED ORDER — POLYETHYLENE GLYCOL 3350 17 G PO PACK: 17.0000 g | PACK | Freq: Every day | ORAL | Status: DC | PRN

## 2021-04-07 MED ORDER — ACETAMINOPHEN 650 MG RE SUPP: 650.0000 mg | Freq: Four times a day (QID) | RECTAL | Status: DC | PRN

## 2021-04-07 MED ORDER — RAMELTEON 8 MG PO TABS
8.0000 mg | ORAL_TABLET | Freq: Every day | ORAL | Status: DC
  Administered 2021-04-07 – 2021-04-09 (×3): 8 mg via ORAL
  Filled 2021-04-07 (×4): qty 1

## 2021-04-07 MED ORDER — IOHEXOL 350 MG/ML SOLN
80.0000 mL | Freq: Once | INTRAVENOUS | Status: AC | PRN
  Administered 2021-04-07: 80 mL via INTRAVENOUS

## 2021-04-07 MED ORDER — FUROSEMIDE 10 MG/ML IJ SOLN
40.0000 mg | Freq: Once | INTRAMUSCULAR | Status: AC
  Administered 2021-04-07: 40 mg via INTRAVENOUS
  Filled 2021-04-07: qty 4

## 2021-04-07 MED ORDER — AZITHROMYCIN 250 MG PO TABS
500.0000 mg | ORAL_TABLET | Freq: Once | ORAL | Status: AC
  Administered 2021-04-07: 500 mg via ORAL
  Filled 2021-04-07: qty 2

## 2021-04-07 MED ORDER — ALBUTEROL SULFATE (2.5 MG/3ML) 0.083% IN NEBU: 3.0000 mL | INHALATION_SOLUTION | Freq: Four times a day (QID) | RESPIRATORY_TRACT | Status: DC | PRN

## 2021-04-07 MED ORDER — IOHEXOL 300 MG/ML  SOLN
100.0000 mL | Freq: Once | INTRAMUSCULAR | Status: AC | PRN
  Administered 2021-04-07: 100 mL via INTRAVENOUS

## 2021-04-07 NOTE — Progress Notes (Signed)
  Echocardiogram 2D Echocardiogram has been performed.  Connor Solomon 04/07/2021, 6:14 PM

## 2021-04-07 NOTE — Hospital Course (Addendum)
Patient Summary:  [name] is a 60 year old male with a past medical history of  hypertension and arthritis here with a new diagnosis of Acute HFrEF exacerbation (EF 20-25%) as well as an acute inflammatory state.  Acute HFrEF (EF 20-25%); New Diagnosis of Unknown Etiology Connor Solomon initially presented with worsening dyspnea, chest tightness, and orthopnea.He was found to have a right lower lobe opacity on chest x-ray and bilateral pleural effusions on CT Angiogram. BNP was elevated to 380.4, EKG showed isolated ST elevation in V1 and sinus tachycardia with flat troponin levels. Formal echo was obtained which showed a reduced LVEF of 20-25%, global hypokinesis, and grade 3 diastolic dysfunction consistent with a diagnosis of HFrEF. He was diuresed with IV Lasix 40 mg x3 which significantly improved his breathing symptoms. Cardiology was consulted and they recommended obtaining a left and right heart cath procedure which showed low filling pressures and no ischemia. Non-ischemic cardiomyopathy including TSH, Ferritin and HIV was negative. He underwent cMRI on day of discharge for diagnostic clarification. He was started on GDMT with Losartan, Spironolactone, and B-blocker as was tolerable hemodynamically. Discharge medication regimen is as follows:  Coreg 3.125 mg BID Losartan 25 mg Daily Spironolactone 25 mg Daily Lasix 20 mg PRN ( if 3 or more pound weight gain in 24 hour period)  Unintentional Weight Loss; Acute Inflammatory State Presented with a 25 pound unintentional weight loss, fevers, night sweats, joint pain, and morning stiffness over the last year. Differential included malignancy and autoimmune disease. CTAP was unremarkable except for retroperitoneal and inguinal lymphadenopathy. Physical exam revealed a diffuse, papular rash on his extremities of which a punch biopsy sample was sent to pathology to assess for vasculitis. Malignancy markers CEA and LDH were non-elevated. Inflammatory  markers ESR and CRP were elevated. Additional autoimmune lab work was obtained which were non-specific. Spoke to radiology to determine which lymph nodes would be amenable for biopsy and they suggested PET Scan prior to biopsy.  Anemia of Inflammation Patient had a Hgb of 9.0 and MCV of 78.2 and thrombocytosis, which was stable from prior. Ferritin was normal and iron studies showed low iron levels with normal TIBC. Peripheral blood smear was ordered due to concern for malignancy and was unremarkable. Reticulocyte count was 1.8% with an index of 1.3. Thought to be secondary to inflammation given elevated inflammatory markers and normal ferritin.

## 2021-04-07 NOTE — ED Triage Notes (Signed)
Brought in by EMS for sob with exertion since Sunday. Clear lung sounds. Reports chest tightness and dry cough as well with O2 100% RA. Pt recently diagnosed with nodules to lungs. Alert and oriented x 4.

## 2021-04-07 NOTE — ED Provider Notes (Signed)
[facility] EMERGENCY DEPARTMENT Provider Note   CSN: [id number] Arrival date & time: 04/07/21  2122     History Chief Complaint  Patient presents with   Shortness of Breath    Connor Solomon is a 60 y.o. male.   Shortness of Breath  Patient presents to the ED with complaints of shortness of breath.  Patient states he started noticing some symptoms on Sunday.  He was getting short of breath with activity.  Patient also started having some tightness in his chest since then.  Has had a dry cough.  Patient denies any trouble with fevers or chills.  He has noticed some intermittent leg swelling bilaterally after he works during the day.  This morning the symptoms were worse so he came to the ED.  Patient has not been vaccinated for COVID Past Medical History:  Diagnosis Date   Arthritis    hands, elbows bilaterally   Full dentures    Hypertension    Pt states he doen not have HTN and has never been treated for HTN   Kidney stone on left side last stone 4-5 yrs ago   recurrent (3 episodes)   Panic disorder    pt states has resolved   Syncope    resolved- was related to panic attacks    Patient Active Problem List   Diagnosis Date Noted   Injury of middle finger 06/09/2011   INSOMNIA 11/17/2009   WRIST PAIN, LEFT 10/14/2009   PANIC DISORDER 09/22/2009   HYPERTENSION, BENIGN ESSENTIAL 09/22/2009    Past Surgical History:  Procedure Laterality Date   CARPAL METACARPAL FUSION WITH DISTAL RADIAL BONE GRAFT Left 05/30/2013   Procedure: LEFT THUMB METACARPAL JOINT FUSION;  Surgeon: Schuyler Amor, MD;  Location: [place];  Service: Orthopedics;  Laterality: Left;   ESOPHAGOGASTRODUODENOSCOPY N/A 02/12/2014   Procedure: ESOPHAGOGASTRODUODENOSCOPY (EGD);  Surgeon: Jerene Bears, MD;  Location: [facility] ENDOSCOPY;  Service: Endoscopy;  Laterality: N/A;   FOREIGN BODY REMOVAL N/A 02/12/2014   Procedure: FOREIGN BODY REMOVAL;  Surgeon: Jerene Bears, MD;   Location: [place];  Service: Endoscopy;  Laterality: N/A;   OPEN REDUCTION INTERNAL FIXATION (ORIF) DISTAL RADIAL FRACTURE Left 11/29/2012   Procedure: OPEN REDUCTION INTERNAL FIXATION (ORIF) DISTAL RADIAL FRACTURE;  Surgeon: Schuyler Amor, MD;  Location: [place];  Service: Orthopedics;  Laterality: Left;  LEFT DISTAL RADIUS OSTEOTOMY WITH BONE GRAFT   TENDON REPAIR Left 02/07/2013   Procedure: LEFT EXTENSOR INDICIS PROPRIUS  TO EXTENSOR POLLICIS LONGUS TENDON TRANSFER;  Surgeon: Schuyler Amor, MD;  Location: [place];  Service: Orthopedics;  Laterality: Left;   WISDOM TOOTH EXTRACTION     WRIST SURGERY     fx only       Family History  Problem Relation Age of Onset   Heart attack Father    Diabetes Neg Hx    Hyperlipidemia Neg Hx    Hypertension Neg Hx     Social History   Tobacco Use   Smoking status: Former    Pack years: 0.00    Types: Cigars    Quit date: 10/11/2020    Years since quitting: 0.4   Smokeless tobacco: Never   Tobacco comments:    smokes 1 cigar a week  Substance Use Topics   Alcohol use: No   Drug use: No    Home Medications Prior to Admission medications   Medication Sig Start Date End Date Taking? Authorizing Provider  albuterol (VENTOLIN HFA) 108 (90 Base) MCG/ACT inhaler Inhale 2 puffs into the lungs every 6 (six) hours as needed for wheezing or shortness of breath. 03/11/21   June Leap L, DO  ferrous sulfate 300 (60 Fe) MG/5ML syrup Take 5 mLs (300 mg total) by mouth daily. 03/27/21   [name], NP  hydrochlorothiazide (HYDRODIURIL) 25 MG tablet Take 1 tablet (25 mg total) by mouth daily. 09/02/20   [name], NP    Allergies    Hydrocodone  Review of Systems   Review of Systems  Respiratory:  Positive for shortness of breath.   All other systems reviewed and are negative.  Physical Exam Updated Vital Signs BP (!) 143/80   Pulse 97   Temp 97.8 F (36.6 C) (Oral)   Resp  (!) 26   Ht 1.727 m ($Remove'5\' 8"'CyKCywc$ )   Wt 69.9 kg   SpO2 98%   BMI 23.42 kg/m   Physical Exam Vitals and nursing note reviewed.  Constitutional:      General: He is not in acute distress.    Appearance: He is well-developed.  HENT:     Head: Normocephalic and atraumatic.     Right Ear: External ear normal.     Left Ear: External ear normal.  Eyes:     General: No scleral icterus.       Right eye: No discharge.        Left eye: No discharge.     Conjunctiva/sclera: Conjunctivae normal.  Neck:     Trachea: No tracheal deviation.  Cardiovascular:     Rate and Rhythm: Normal rate and regular rhythm.  Pulmonary:     Effort: Pulmonary effort is normal. No respiratory distress.     Breath sounds: Normal breath sounds. No stridor. No wheezing or rales.     Comments: Able to speak in full sentences Abdominal:     General: Bowel sounds are normal. There is no distension.     Palpations: Abdomen is soft.     Tenderness: There is no abdominal tenderness. There is no guarding or rebound.  Musculoskeletal:        General: No tenderness or deformity.     Cervical back: Neck supple.     Right lower leg: No tenderness. No edema.     Left lower leg: No tenderness. No edema.  Skin:    General: Skin is warm and dry.     Findings: No rash.  Neurological:     General: No focal deficit present.     Mental Status: He is alert.     Cranial Nerves: No cranial nerve deficit (no facial droop, extraocular movements intact, no slurred speech).     Sensory: No sensory deficit.     Motor: No abnormal muscle tone or seizure activity.     Coordination: Coordination normal.  Psychiatric:        Mood and Affect: Mood normal.    ED Results / Procedures / Treatments   Labs (all labs ordered are listed, but only abnormal results are displayed) Labs Reviewed  BASIC METABOLIC PANEL - Abnormal; Notable for the following components:      Result Value   Calcium 8.8 (*)    All other components within normal  limits  CBC - Abnormal; Notable for the following components:   RBC 3.80 (*)    Hemoglobin 9.0 (*)    HCT 29.7 (*)    MCV 78.2 (*)    MCH 23.7 (*)  RDW 17.1 (*)    Platelets 459 (*)    All other components within normal limits  D-DIMER, QUANTITATIVE - Abnormal; Notable for the following components:   D-Dimer, Quant 1.20 (*)    All other components within normal limits  BRAIN NATRIURETIC PEPTIDE - Abnormal; Notable for the following components:   B Natriuretic Peptide 380.4 (*)    All other components within normal limits  URINALYSIS, ROUTINE W REFLEX MICROSCOPIC - Abnormal; Notable for the following components:   Color, Urine STRAW (*)    pH 9.0 (*)    All other components within normal limits  TROPONIN I (HIGH SENSITIVITY) - Abnormal; Notable for the following components:   Troponin I (High Sensitivity) 32 (*)    All other components within normal limits  TROPONIN I (HIGH SENSITIVITY) - Abnormal; Notable for the following components:   Troponin I (High Sensitivity) 30 (*)    All other components within normal limits  RESP PANEL BY RT-PCR (FLU A&B, COVID) [id number]    EKG EKG Interpretation  Date/Time:  Tuesday April 07 2021 09:45:20 EDT Ventricular Rate:  93 PR Interval:  134 QRS Duration: 88 QT Interval:  365 QTC Calculation: 454 R Axis:   79 Text Interpretation: Sinus rhythm Multiple premature complexes, vent & supraven , new since last tracing Consider left ventricular hypertrophy Nonspecific T wave abnormality Confirmed by Dorie Rank [phone]) on 04/07/2021 9:55:28 AM  Radiology CT Angio Chest PE W and/or Wo Contrast  Result Date: 04/07/2021 CLINICAL DATA:  Increasing shortness of breath EXAM: CT ANGIOGRAPHY CHEST WITH CONTRAST TECHNIQUE: Multidetector CT imaging of the chest was performed using the standard protocol during bolus administration of intravenous contrast. Multiplanar CT image reconstructions and MIPs were obtained to evaluate the vascular anatomy. CONTRAST:   102mL OMNIPAQUE IOHEXOL 350 MG/ML SOLN COMPARISON:  Film from earlier in the same day. FINDINGS: Cardiovascular: Thoracic aorta and its branches demonstrate atherosclerotic calcifications without aneurysmal dilatation. Heart is not significantly enlarged in size. Pulmonary artery shows a normal branching pattern. No discrete filling defect is identified to suggest pulmonary embolism. Mediastinum/Nodes: Thoracic inlet is within normal limits. Scattered small hilar and mediastinal lymph nodes are noted likely reactive in nature. The esophagus as visualized is within normal limits. Lungs/Pleura: Bilateral moderate-sized effusions are noted. Patchy ground-glass opacities are noted likely related to acute multifocal infiltrate. No sizable parenchymal nodule is seen. Upper Abdomen: Within normal limits. Musculoskeletal: Degenerative changes of the thoracic spine are noted. Review of the MIP images confirms the above findings. IMPRESSION: No evidence of pulmonary emboli. Bilateral pleural effusions of moderate size. Patchy airspace opacities consistent with multifocal pneumonia. Aortic Atherosclerosis (ICD10-I70.0). Electronically Signed   By: Inez Catalina M.D.   On: 04/07/2021 14:24   DG Chest Port 1 View  Result Date: 04/07/2021 CLINICAL DATA:  60 year old male with shortness of breath on exertion for 2-3 days. Dry cough. Former smoker. EXAM: PORTABLE CHEST 1 VIEW COMPARISON:  Chest radiographs 02/12/2014 and earlier. FINDINGS: Portable AP semi upright view at 1013 hours. Cardiac size at the upper limits of normal. Other mediastinal contours are within normal limits. Asymmetric patchy and indistinct opacity at the medial right lung base. Elsewhere lung markings appear within normal limits. A degree of pulmonary hyperinflation is suspected. No pneumothorax or pleural effusion. Visualized tracheal air column is within normal limits. Chronic appearing proximal right humerus deformity, was acute in August last year.  Chronic right lateral 6th rib deformity. No acute osseous abnormality identified. IMPRESSION: 1. Patchy and indistinct right lung base  opacity is nonspecific, but suspicious for bronchopneumonia in this clinical setting. No pleural effusion. If there are signs/symptoms of infection then followup PA and lateral chest X-ray is recommended in 3-4 weeks following trial of antibiotic therapy to ensure resolution and exclude underlying malignancy. Otherwise recommend Chest CT (IV contrast preferred) to further characterize. 2. Evolution of proximal right humerus fracture since August. Electronically Signed   By: Genevie Ann M.D.   On: 04/07/2021 10:34    Procedures Procedures   Medications Ordered in ED Medications  cefTRIAXone (ROCEPHIN) 1 g in sodium chloride 0.9 % 100 mL IVPB (has no administration in time range)  azithromycin (ZITHROMAX) tablet 500 mg (has no administration in time range)  aspirin chewable tablet 324 mg (324 mg Oral Given 04/07/21 1148)  iohexol (OMNIPAQUE) 350 MG/ML injection 80 mL (80 mLs Intravenous Contrast Given 04/07/21 1232)    ED Course  I have reviewed the triage vital signs and the nursing notes.  Pertinent labs & imaging results that were available during my care of the patient were reviewed by me and considered in my medical decision making (see chart for details).  Clinical Course as of 04/07/21 1436  Tue Apr 07, 2021  1045 Possible bronchopneumonia noted on right lung base [JK]  1104 Anemia unchanged from recent labs [JK]  1104 D-dimer elevated [JK]  1135 Troponin elevated at 32. [IP]  3825 Urinalysis negative for infection.  BNP slightly elevated at 380.   [JK]  1430 Chest x-ray does not show evidence of PE.  Multifocal pneumonia suggested by CT scan [JK]    Clinical Course User Index [JK] Dorie Rank, MD   MDM Rules/Calculators/A&P                          Patient presented to the ED with complaints of increasing shortness of breath.  Symptoms were concerning  for the possibility of pneumonia versus PE versus CHF as well as exercise-induced angina.  Patient did not have an oxygen requirement here.  Laboratory tests were notable for elevated D-dimer as well as stable anemia.  He also had an elevated BNP and troponin.  CT scan was performed and does not show any evidence of PE.  Findings are suggestive of possible multifocal pneumonia.  Presentation is atypical.  Patient does not have any fever or leukocytosis.  We will treat him with antibiotics but I do think he may need further evaluation of his elevated BNP and troponins.  I will consult the medical service for admission Final Clinical Impression(s) / ED Diagnoses Final diagnoses:  Multifocal pneumonia  Dyspnea on exertion  Anemia, unspecified type     Dorie Rank, MD 04/07/21 1437

## 2021-04-07 NOTE — H&P (Addendum)
Date: 04/07/2021               Patient Name:  [name] MRN: [id number]  DOB: 1961-10-09 Age / Sex: 60 y.o., male   PCP: [name], NP              Medical Service: Internal Medicine Teaching Service              Attending Physician: Dr. Evette Doffing, Mallie Mussel, *    First Contact: Ok Edwards, MS4 Pager: [phone]  Second Contact: Dr. Charleen Kirks Pager: [phone]            After Hours (After 5p/  First Contact Pager: [phone]  weekends / holidays): Second Contact Pager: [phone]   Chief Complaint: Dyspnea on exertion and chest tightness  History of Present Illness:   Connor Solomon is a 60 year old male with a past medical hx of HTN, anxiety, and arthritis that is presenting for a 6 month history of dyspnea on exertion that has worsened over the last 2-3 weeks, especially since the last 3 days and a non-productive cough. Initially the dyspnea was on exertion but is now affecting him at rest. He is now unable to climb the stairs without needing to pause to catch his breath. He is unable to lay flat at night due to breathing discomfort and requires 3 pillows to sleep at night. He also endorses chest tightness/pressure that occurs when he is exerting himself at work with is accompanied with vomiting that resolves with rest within a few hours. The chest discomfort does not radiate anywhere. He also endorses bilateral leg swelling and ache that is worse towards the end of the day and is alleviated by leg elevation.   He underwent a stress echo test in 2010, which was negative for ischemia, conduction abnormalities, and arrhythmias. Chest CT in August 2021 showed subcentimeter (<4 mm) nodules in the right upper lung lobe. He saw Dr. Valeta Harms (pulmonology) on 03/2021 who suggested PFT studies that have not been done yet.   Of note, he has lost about 25 pounds over the last year despite having a good appetite. During this past month he has vomiting when he tries to eat anything but denies abdominal  pain, melena or changes in stool caliber.  He was initially diagnosed with HTN for which he was taking HCTZ, which was discontinued by his PCP as his pressures normalized.   Meds No outpatient medications have been marked as taking for the 04/07/21 Solomon Connor Solomon).   Allergies: Allergies as of 04/07/2021 - Review Complete 04/07/2021  Allergen Reaction Noted   Hydrocodone Itching and Nausea And Vomiting 09/22/2009   Past Medical History:  Diagnosis Date   Arthritis    hands, elbows bilaterally   Full dentures    Hypertension    Pt states he doen not have HTN and has never been treated for HTN   Kidney stone on left side last stone 4-5 yrs ago   recurrent (3 episodes)   Panic disorder    pt states has resolved   Syncope    resolved- was related to panic attacks    Family History:  Mother- Stroke Father- Heart Attack Siblings- Brain cancer, blood cancer, Thyroid cancer, lung cancer  Social History: Patient lives at home with his wife, children and grand children. 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 He denies cigarette, alcohol, and recreational drug use.  He used to smoke 2 cigars per week to cope with his daughter's death, but quit 6 years ago  Review of Systems: A complete ROS was negative except as per HPI.   Physical Exam: Blood pressure 130/88, pulse 100, temperature 98 F (36.7 C), temperature source Oral, resp. rate 15, height $RemoveBe'5\' 8"'ITffnGMFU$  (1.727 m), weight 69.9 kg, SpO2 99 %.  Constitutional: uncomfortable-appearing gentleman sitting up, in no acute distress HENT: normocephalic atraumatic, mucous membranes moist Eyes: conjunctiva non-erythematous, no scleral icterus Neck: no JVD Cardiovascular: sinus tachycardia with regular rhythm, no murmurs, rubs, or gallops, no pitting edema in BLEs Pulmonary/Chest: increased work of breathing on room air, tachypneic, upper and lower lung fields clear to  auscultation bilaterally Abdominal: soft, non-tender, non-distended MSK: no gross deficits Neurological: alert & oriented x 3 Skin: warm, dry, well-perfused Psych: affect mood congruent   EKG: personally reviewed my interpretation is ST elevation in leads V1 on repeat EKG associated with active chest pain  CXR: personally reviewed my interpretation is right lower lobe patchy infiltrate  Assessment & Plan by Problem: Active Problems:   Acute heart failure ([place])  Dyspnea  Patient presents with 6 month hx of dyspnea on exertion that has worsened over the past month. CXR shows right lower lobe opacity and CT angio chest shows bilateral pleural effusions. BNP is elevated to 380.4 but he does not have JVD or pitting edema on exam. Likely etiology at this time is acute heart failure exacerbation given the elevated BNP, pleural effusions, and orthopnea as opposed to PNA given that he is afebrile and does not have an elevated WBC. Malignancy is also on the the differential given unintentional weight loss, family hx of lung cancer, cigar use history, and occupational exposure. Plan is to obtain formal echo for diagnostic clarification. - Formal echo - Strict I/Os - Daily weights - 6 minute walk test - Supplemental oxygen as needed  Chest Pain; Unstable Angina Patient endorses 6 month history of chest discomfort and vomiting on exertion that is relieved with rest. Admission EKG was unremarkable for ST changes and troponins were flat (32-> 30). However soon after admission he began endorsing chest pain at rest and EKG at that time showed ST elevation isolated to lead V1. Repeat Troponins were drawn for diagnostic clarification of ACS.  - follow up troponins - Nitroglycerin PRN for chest pain - EKG for new chest pain - Telemetry  Unintentional Weight Loss; Iron Deficiency Anemia Patient has lost about 25 pounds in the last year and has been endorsing vomiting with meals for the last month. He  denies melena, changes in stool caliber, and abdominal pain. Also has worsening iron-deficiency anemia with Hgb 9.0 and MCV 78.2. Given this, we are concerned for GI malignancy at this time.  - follow up iron studies - Daily CBC  Dispo: Admit patient to Inpatient with expected length of stay greater than 2 midnights.  SignedOk Edwards, Medical Student 04/07/2021, 4:10 PM  Pager: [phone]   Attestation for Student Documentation:  I personally was present and performed or re-performed the history, physical exam and medical decision-making activities of this service and have verified that the service and findings are accurately documented in the student's note.  Of note, patient's echo results show reduced EF, 20-25%, with moderately dilated LV and restrictive diastolic dysfunction. IVC dilated. Will start IV Lasix tonight at 40 mg. Will likely need Cardiology consult for ischemic evaluation during this admission.   Regarding worsening CP this afternoon, troponin is still pending.  Repeat EKG showed ST changes in V1 predominantly. If significant increase in troponin, will urgently consult Cardiology tonight.   Dr. Jose Persia Internal Medicine PGY-2  Pager: [phone] After 5pm on weekdays and 1pm on weekends: On Call pager [phone]  04/07/2021, 6:49 PM

## 2021-04-08 ENCOUNTER — Ambulatory Visit: Payer: Self-pay | Admitting: Pulmonary Disease

## 2021-04-08 ENCOUNTER — Encounter (HOSPITAL_COMMUNITY): Payer: Self-pay | Admitting: Student in an Organized Health Care Education/Training Program

## 2021-04-08 DIAGNOSIS — I472 Ventricular tachycardia: Secondary | ICD-10-CM | POA: Diagnosis not present

## 2021-04-08 DIAGNOSIS — F410 Panic disorder [episodic paroxysmal anxiety] without agoraphobia: Secondary | ICD-10-CM | POA: Diagnosis not present

## 2021-04-08 DIAGNOSIS — I110 Hypertensive heart disease with heart failure: Secondary | ICD-10-CM | POA: Diagnosis not present

## 2021-04-08 DIAGNOSIS — Z79899 Other long term (current) drug therapy: Secondary | ICD-10-CM | POA: Diagnosis not present

## 2021-04-08 DIAGNOSIS — Z28310 Unvaccinated for covid-19: Secondary | ICD-10-CM | POA: Diagnosis not present

## 2021-04-08 DIAGNOSIS — D509 Iron deficiency anemia, unspecified: Secondary | ICD-10-CM | POA: Diagnosis not present

## 2021-04-08 DIAGNOSIS — I340 Nonrheumatic mitral (valve) insufficiency: Secondary | ICD-10-CM

## 2021-04-08 DIAGNOSIS — I428 Other cardiomyopathies: Secondary | ICD-10-CM | POA: Diagnosis not present

## 2021-04-08 DIAGNOSIS — R634 Abnormal weight loss: Secondary | ICD-10-CM | POA: Diagnosis not present

## 2021-04-08 DIAGNOSIS — D472 Monoclonal gammopathy: Secondary | ICD-10-CM | POA: Diagnosis not present

## 2021-04-08 DIAGNOSIS — Z823 Family history of stroke: Secondary | ICD-10-CM | POA: Diagnosis not present

## 2021-04-08 DIAGNOSIS — Z801 Family history of malignant neoplasm of trachea, bronchus and lung: Secondary | ICD-10-CM | POA: Diagnosis not present

## 2021-04-08 DIAGNOSIS — I5023 Acute on chronic systolic (congestive) heart failure: Secondary | ICD-10-CM | POA: Diagnosis not present

## 2021-04-08 DIAGNOSIS — D6489 Other specified anemias: Secondary | ICD-10-CM | POA: Diagnosis not present

## 2021-04-08 DIAGNOSIS — Z8616 Personal history of COVID-19: Secondary | ICD-10-CM | POA: Diagnosis not present

## 2021-04-08 DIAGNOSIS — I5021 Acute systolic (congestive) heart failure: Secondary | ICD-10-CM

## 2021-04-08 DIAGNOSIS — I509 Heart failure, unspecified: Secondary | ICD-10-CM

## 2021-04-08 DIAGNOSIS — Z8249 Family history of ischemic heart disease and other diseases of the circulatory system: Secondary | ICD-10-CM | POA: Diagnosis not present

## 2021-04-08 DIAGNOSIS — Z87891 Personal history of nicotine dependence: Secondary | ICD-10-CM | POA: Diagnosis not present

## 2021-04-08 DIAGNOSIS — Z808 Family history of malignant neoplasm of other organs or systems: Secondary | ICD-10-CM | POA: Diagnosis not present

## 2021-04-08 DIAGNOSIS — D75838 Other thrombocytosis: Secondary | ICD-10-CM | POA: Diagnosis not present

## 2021-04-08 DIAGNOSIS — I200 Unstable angina: Secondary | ICD-10-CM | POA: Diagnosis not present

## 2021-04-08 HISTORY — DX: Heart failure, unspecified: I50.9

## 2021-04-08 LAB — C-REACTIVE PROTEIN: CRP: 7.6 mg/dL — ABNORMAL HIGH (ref <1.0)

## 2021-04-08 LAB — COMPREHENSIVE METABOLIC PANEL
ALT: 12 U/L (ref 0–44)
AST: 13 U/L — ABNORMAL LOW (ref 15–41)
Albumin: 2.5 g/dL — ABNORMAL LOW (ref 3.5–5.0)
Alkaline Phosphatase: 72 U/L (ref 38–126)
Anion gap: 10 (ref 5–15)
BUN: 12 mg/dL (ref 6–20)
CO2: 23 mmol/L (ref 22–32)
Calcium: 8.8 mg/dL — ABNORMAL LOW (ref 8.9–10.3)
Chloride: 101 mmol/L (ref 98–111)
Creatinine, Ser: 0.88 mg/dL (ref 0.61–1.24)
GFR, Estimated: >60 mL/min (ref >60)
Glucose, Bld: 111 mg/dL — ABNORMAL HIGH (ref 70–99)
Potassium: 4 mmol/L (ref 3.5–5.1)
Sodium: 134 mmol/L — ABNORMAL LOW (ref 135–145)
Total Bilirubin: 0.6 mg/dL (ref 0.3–1.2)
Total Protein: 7.1 g/dL (ref 6.5–8.1)

## 2021-04-08 LAB — CBC WITH DIFFERENTIAL/PLATELET
Abs Immature Granulocytes: 0.04 10*3/uL (ref 0.00–0.07)
Basophils Absolute: 0.1 10*3/uL (ref 0.0–0.1)
Basophils Relative: 1 %
Eosinophils Absolute: 0.5 10*3/uL (ref 0.0–0.5)
Eosinophils Relative: 6 %
HCT: 29 % — ABNORMAL LOW (ref 39.0–52.0)
Hemoglobin: 9.2 g/dL — ABNORMAL LOW (ref 13.0–17.0)
Immature Granulocytes: 0 %
Lymphocytes Relative: 17 %
Lymphs Abs: 1.7 10*3/uL (ref 0.7–4.0)
MCH: 24.1 pg — ABNORMAL LOW (ref 26.0–34.0)
MCHC: 31.7 g/dL (ref 30.0–36.0)
MCV: 75.9 fL — ABNORMAL LOW (ref 80.0–100.0)
Monocytes Absolute: 0.8 10*3/uL (ref 0.1–1.0)
Monocytes Relative: 8 %
Neutro Abs: 6.6 10*3/uL (ref 1.7–7.7)
Neutrophils Relative %: 68 %
Platelets: 451 10*3/uL — ABNORMAL HIGH (ref 150–400)
RBC: 3.82 MIL/uL — ABNORMAL LOW (ref 4.22–5.81)
RDW: 17.2 % — ABNORMAL HIGH (ref 11.5–15.5)
WBC: 9.7 10*3/uL (ref 4.0–10.5)
nRBC: 0 % (ref 0.0–0.2)

## 2021-04-08 LAB — LIPID PANEL
Cholesterol: 89 mg/dL (ref 0–200)
HDL: 31 mg/dL — ABNORMAL LOW (ref >40)
LDL Cholesterol: 49 mg/dL (ref 0–99)
Total CHOL/HDL Ratio: 2.9 RATIO
Triglycerides: 46 mg/dL (ref <150)
VLDL: 9 mg/dL (ref 0–40)

## 2021-04-08 LAB — IRON AND TIBC
Iron: 21 ug/dL — ABNORMAL LOW (ref 45–182)
Saturation Ratios: 8 % — ABNORMAL LOW (ref 17.9–39.5)
TIBC: 262 ug/dL (ref 250–450)
UIBC: 241 ug/dL

## 2021-04-08 LAB — FERRITIN: Ferritin: 160 ng/mL (ref 24–336)

## 2021-04-08 LAB — TSH: TSH: 2.513 u[IU]/mL (ref 0.350–4.500)

## 2021-04-08 LAB — MAGNESIUM: Magnesium: 2 mg/dL (ref 1.7–2.4)

## 2021-04-08 LAB — SEDIMENTATION RATE: Sed Rate: >140 mm/hr — ABNORMAL HIGH (ref 0–16)

## 2021-04-08 MED ORDER — SODIUM CHLORIDE 0.9% FLUSH: 3.0000 mL | INTRAVENOUS | Status: DC | PRN

## 2021-04-08 MED ORDER — SPIRONOLACTONE 12.5 MG HALF TABLET
12.5000 mg | ORAL_TABLET | Freq: Every day | ORAL | Status: DC
  Administered 2021-04-08 – 2021-04-10 (×3): 12.5 mg via ORAL
  Filled 2021-04-08 (×3): qty 1

## 2021-04-08 MED ORDER — SODIUM CHLORIDE 0.9% FLUSH
3.0000 mL | Freq: Two times a day (BID) | INTRAVENOUS | Status: DC
  Administered 2021-04-08 – 2021-04-09 (×2): 3 mL via INTRAVENOUS

## 2021-04-08 MED ORDER — FUROSEMIDE 10 MG/ML IJ SOLN
40.0000 mg | Freq: Two times a day (BID) | INTRAMUSCULAR | Status: AC
  Administered 2021-04-08 (×2): 40 mg via INTRAVENOUS
  Filled 2021-04-08 (×2): qty 4

## 2021-04-08 MED ORDER — SODIUM CHLORIDE 0.9 % IV SOLN: INTRAVENOUS | Status: DC

## 2021-04-08 MED ORDER — LOSARTAN POTASSIUM 25 MG PO TABS
25.0000 mg | ORAL_TABLET | Freq: Every day | ORAL | Status: DC
  Administered 2021-04-08 – 2021-04-10 (×3): 25 mg via ORAL
  Filled 2021-04-08 (×3): qty 1

## 2021-04-08 MED ORDER — SODIUM CHLORIDE 0.9 % IV SOLN: 250.0000 mL | INTRAVENOUS | Status: DC | PRN

## 2021-04-08 NOTE — Progress Notes (Signed)
IV Team consulted to place 2nd PIV.  Pt is going for procedure tomorrow, approx 1400.  Recommendation was made to RN, Tai to reassess for PIV access approx 2-3hrs prior to procedure.  Pt aware and agrees. 

## 2021-04-08 NOTE — Consult Note (Addendum)
Advanced Heart Failure Team Consult Note   Primary Physician: [name], NP PCP-Cardiologist:  None  Reason for Consultation: Acute Systolic Heart Failure   HPI:    Connor Solomon is seen today for evaluation of new acute systolic heart failure at the request of Dr. Evette Doffing, Internal Medicine.   59 y/o WM w/ h/o HTN and FH of SCD but no other CRFs.  His father had SCD and died at home at the age of 77. Pt had stress echo in 2010 for CP showing no stress arrhythmias or  conduction abnormalities. The stress ECG was negative for ischemia. There was no echocardiographic evidence for stress-induced ischemia. EF normal. In 2011, he had syncope and evaluated by Dr. Harrington Challenger but this was felt to be vasovagal syncope. No further cardiac w/u.   Tested + for COVID 5/21. Did not require hospitalization but reports he hasn't felt well since. Not vaccinated.   For the last 6 months he has noted marked fatigue + 20 lb unintentional wt loss and some occasional night sweats.   3 weeks ago, he developed dyspnea that has progressively worsened. Initially DOE progressing to resting dyspnea, orthopnea + PND and LEE. Also recent chest pain described and substernal chest tightness. Given symptoms, he came to the ED where he was found to be in acute CHF. BNP 380. CXR showed patchy and indistinct right lung base opacity, nonspecific, but suspicious for bronchopneumonia. Also could not r/o underlying malignancy. D-dimer 1.20. CT negative for PE but + for moderate bilateral pleural effusions and patchy airspace opacities consistent with multifocal pneumonia. COVID and Flu negative. WBC normal. AF. HS trop 32>>30>>23. EKG NSR 92 bpm w/ LVH. Also noted to be anemic w/ hgb 8.6. Iron studies c/w IDA. He has never had a colonoscopy. Denies melena and hematochezia.   He was admitted by IM and started on IV Lasix. Echo obtained showing severely reduced LVEF, 20-25% w/ glogal HK, Grade III DD (restrictive), RV normal.  Mod-severe MR. No LVH.   Wt is down 5 lb w/ diuresis. He reports feeling better. Not significantly volume overloaded on exam today. Scr stable at 0.88. K 4.0  He does report family h/o cancers. 2 brothers had lung cancer, sister w/ brain cancer, 1 brother currently undergoing tx for "blood cancer".   He is married w/ children. Works as a Furniture conservator/restorer. Denies tobacco, ETOH or elicit drug use.     Echo 04/07/21  1. Left ventricular ejection fraction, by estimation, is 20 to 25%. The  left ventricle has severely decreased function. The left ventricle  demonstrates global hypokinesis. The left ventricular internal cavity size  was moderately dilated. Left  ventricular diastolic parameters are consistent with Grade III diastolic  dysfunction (restrictive). Elevated left atrial pressure.   2. Right ventricular systolic function is normal. The right ventricular  size is normal. Tricuspid regurgitation signal is inadequate for assessing  PA pressure.   3. Left atrial size was severely dilated.   4. The mitral valve is normal in structure. Moderate to severe mitral  valve regurgitation.   5. The aortic valve is tricuspid. Aortic valve regurgitation is trivial.  No aortic stenosis is present.   6. The inferior vena cava is dilated in size with >50% respiratory  variability, suggesting right atrial pressure of 8 mmHg.     Review of Systems: [y] = yes, $Remo'[ ]'DtiVh$  = no   General: Weight gain $RemoveBef'[ ]'DnKnRdipec$ ; Weight loss [Y ]; Anorexia [ Y]; Fatigue [Y ]; Fever $Remove'[ ]'FHPdApX$ ;  Chills [ ] ; Weakness [ Y]  Cardiac: Chest pain/pressure [ Y]; Resting SOB [ ] ; Exertional SOB [ Y]; Orthopnea [ Y]; Pedal Edema [ ] ; Palpitations [ ] ; Syncope [ ] ; Presyncope [ ] ; Paroxysmal nocturnal dyspnea[ Y]  Pulmonary: Cough [ ] ; Wheezing[ ] ; Hemoptysis[ ] ; Sputum [ ] ; Snoring [ ]   GI: Vomiting[ ] ; Dysphagia[ ] ; Melena[ ] ; Hematochezia [ ] ; Heartburn[ ] ; Abdominal pain [ ] ; Constipation [ ] ; Diarrhea [ ] ; BRBPR [ ]   GU: Hematuria[ ] ; Dysuria [ ] ;  Nocturia[ ]   Vascular: Pain in legs with walking [ ] ; Pain in feet with lying flat [ ] ; Non-healing sores [ ] ; Stroke [ ] ; TIA [ ] ; Slurred speech [ ] ;  Neuro: Headaches[ ] ; Vertigo[ ] ; Seizures[ ] ; Paresthesias[ ] ;Blurred vision [ ] ; Diplopia [ ] ; Vision changes [ ]   Ortho/Skin: Arthritis [ ] ; Joint pain [ ] ; Muscle pain [ ] ; Joint swelling [ ] ; Back Pain [ ] ; Rash [ ]   Psych: Depression[ ] ; Anxiety[ ]   Heme: Bleeding problems [ ] ; Clotting disorders [ ] ; Anemia [Y ]  Endocrine: Diabetes [ ] ; Thyroid dysfunction[ ]   Home Medications Prior to Admission medications   Medication Sig Start Date End Date Taking? Authorizing Provider  albuterol (VENTOLIN HFA) 108 (90 Base) MCG/ACT inhaler Inhale 2 puffs into the lungs every 6 (six) hours as needed for wheezing or shortness of breath. 03/11/21  Yes Icard, Bradley L, DO  ferrous sulfate 300 (60 Fe) MG/5ML syrup Take 5 mLs (300 mg total) by mouth daily. 03/27/21  Yes [name], NP    Past Medical History: Past Medical History:  Diagnosis Date   Arthritis    hands, elbows bilaterally   Full dentures    Hypertension    Pt states he doen not have HTN and has never been treated for HTN   Kidney stone on left side last stone 4-5 yrs ago   recurrent (3 episodes)   Panic disorder    pt states has resolved   Syncope    resolved- was related to panic attacks    Past Surgical History: Past Surgical History:  Procedure Laterality Date   CARPAL METACARPAL FUSION WITH DISTAL RADIAL BONE GRAFT Left 05/30/2013   Procedure: LEFT THUMB METACARPAL JOINT FUSION;  Surgeon: Schuyler Amor, MD;  Location: [facility];  Service: Orthopedics;  Laterality: Left;   ESOPHAGOGASTRODUODENOSCOPY N/A 02/12/2014   Procedure: ESOPHAGOGASTRODUODENOSCOPY (EGD);  Surgeon: Jerene Bears, MD;  Location: [facility] ENDOSCOPY;  Service: Endoscopy;  Laterality: N/A;   FOREIGN BODY REMOVAL N/A 02/12/2014   Procedure: FOREIGN BODY REMOVAL;  Surgeon: Jerene Bears, MD;   Location: [facility] [facility];  Service: Endoscopy;  Laterality: N/A;   OPEN REDUCTION INTERNAL FIXATION (ORIF) DISTAL RADIAL FRACTURE Left 11/29/2012   Procedure: OPEN REDUCTION INTERNAL FIXATION (ORIF) DISTAL RADIAL FRACTURE;  Surgeon: Schuyler Amor, MD;  Location: [facility];  Service: Orthopedics;  Laterality: Left;  LEFT DISTAL RADIUS OSTEOTOMY WITH BONE GRAFT   TENDON REPAIR Left 02/07/2013   Procedure: LEFT EXTENSOR INDICIS PROPRIUS  TO EXTENSOR POLLICIS LONGUS TENDON TRANSFER;  Surgeon: Schuyler Amor, MD;  Location: [facility];  Service: Orthopedics;  Laterality: Left;   WISDOM TOOTH EXTRACTION     WRIST SURGERY     fx only    Family History: Family History  Problem Relation Age of Onset   Heart attack Father    Diabetes Neg Hx    Hyperlipidemia Neg Hx    Hypertension  Neg Hx     Social History: Social History   Socioeconomic History   Marital status: Single    Spouse name: Not on file   Number of children: Not on file   Years of education: Not on file   Highest education level: Not on file  Occupational History   Not on file  Tobacco Use   Smoking status: Former    Pack years: 0.00    Types: Cigars    Quit date: 10/11/2020    Years since quitting: 0.4   Smokeless tobacco: Never   Tobacco comments:    smokes 1 cigar a week  Substance and Sexual Activity   Alcohol use: No   Drug use: No   Sexual activity: Not on file  Other Topics Concern   Not on file  Social History Narrative   Not on file   Social Determinants of Health   Financial Resource Strain: Not on file  Food Insecurity: Not on file  Transportation Needs: Not on file  Physical Activity: Not on file  Stress: Not on file  Social Connections: Not on file    Allergies:  Allergies  Allergen Reactions   Hydrocodone Itching and Nausea And Vomiting    Objective:    Vital Signs:   Temp:  [97.6 F (36.4 C)-98.2 F (36.8 C)] 98 F (36.7 C) (06/29 1141) Pulse  Rate:  [59-102] 80 (06/29 1141) Resp:  [14-26] 16 (06/29 1141) BP: (106-143)/(67-88) 111/79 (06/29 1141) SpO2:  [95 %-100 %] 98 % (06/29 1141) Weight:  [68 kg-68.4 kg] 68 kg (06/29 0513) Last BM Date: 04/06/21  Weight change: Filed Weights   04/07/21 0943 04/07/21 1627 04/08/21 0513  Weight: 69.9 kg 68.4 kg 68 kg    Intake/Output:   Intake/Output Summary (Last 24 hours) at 04/08/2021 1417 Last data filed at 04/08/2021 1300 Gross per 24 hour  Intake 340 ml  Output 1250 ml  Net -910 ml      Physical Exam    General:  fatigued appearing. No resp difficulty HEENT: normal Neck: supple. JVP 7 cm . Carotids 2+ bilat; no bruits. No lymphadenopathy or thyromegaly appreciated. Cor: PMI nondisplaced. Regular rate & rhythm. 2/6 MR at apex Lungs: clear Abdomen: soft, nontender, nondistended. No hepatosplenomegaly. No bruits or masses. Good bowel sounds. Extremities: no cyanosis, clubbing, rash, edema Neuro: alert & orientedx3, cranial nerves grossly intact. moves all 4 extremities w/o difficulty. Affect pleasant   Telemetry   NSR, 7 beats NSVT  EKG    NSR 99 bpm, LVH, nonspecific Twave abnormalities   Labs   Basic Metabolic Panel: Recent Labs  Lab 04/07/21 1013 04/08/21 0410  NA 135 134*  K 3.7 4.0  CL 99 101  CO2 22 23  GLUCOSE 95 111*  BUN 7 12  CREATININE 0.75 0.88  CALCIUM 8.8* 8.8*  MG  --  2.0    Liver Function Tests: Recent Labs  Lab 04/08/21 0410  AST 13*  ALT 12  ALKPHOS 72  BILITOT 0.6  PROT 7.1  ALBUMIN 2.5*   No results for input(s): LIPASE, AMYLASE in the last 168 hours. No results for input(s): AMMONIA in the last 168 hours.  CBC: Recent Labs  Lab 04/07/21 1013 04/08/21 0410  WBC 8.0 9.7  NEUTROABS  --  6.6  HGB 9.0* 9.2*  HCT 29.7* 29.0*  MCV 78.2* 75.9*  PLT 459* 451*    Cardiac Enzymes: No results for input(s): CKTOTAL, CKMB, CKMBINDEX, TROPONINI in the last 168 hours.  BNP: BNP (  last 3 results) Recent Labs     04/07/21 1013  BNP 380.4*    ProBNP (last 3 results) No results for input(s): PROBNP in the last 8760 hours.   CBG: No results for input(s): GLUCAP in the last 168 hours.  Coagulation Studies: No results for input(s): LABPROT, INR in the last 72 hours.   Imaging   CT ABDOMEN PELVIS W CONTRAST  Result Date: 04/07/2021 CLINICAL DATA:  Unintended weight loss, iron deficiency anemia EXAM: CT ABDOMEN AND PELVIS WITH CONTRAST TECHNIQUE: Multidetector CT imaging of the abdomen and pelvis was performed using the standard protocol following bolus administration of intravenous contrast. CONTRAST:  174mL OMNIPAQUE IOHEXOL 300 MG/ML  SOLN COMPARISON:  05/31/2020 FINDINGS: Lower chest: Small to moderate bilateral pleural effusions. Dependent atelectasis in the lower lobes. Heart is normal size. Hepatobiliary: No focal hepatic abnormality. Gallbladder unremarkable. Pancreas: No focal abnormality or ductal dilatation. Spleen: No focal abnormality.  Normal size. Adrenals/Urinary Tract: No adrenal abnormality. No focal renal abnormality. No stones or hydronephrosis. Urinary bladder is unremarkable. Stomach/Bowel: Stomach, large and small bowel grossly unremarkable. Normal appendix. Vascular/Lymphatic: Mildly prominent retroperitoneal lymph nodes. Left periaortic node has a short axis diameter of 13 mm. These are similar to prior study. Mildly enlarged inguinal lymph nodes bilaterally, left greater than right. Left inguinal index node has a short axis diameter of 10 mm. Reproductive: No visible focal abnormality. Other: No free fluid or free air. Musculoskeletal: No acute bony abnormality. IMPRESSION: Small to moderate bilateral pleural effusions. Dependent atelectasis in the lower lobes. Mildly enlarged retroperitoneal lymph nodes and bilateral inguinal lymph nodes, not significantly changed since prior study. No acute findings in the abdomen or pelvis. Electronically Signed   By: Rolm Baptise M.D.   On:  04/07/2021 23:47   ECHOCARDIOGRAM COMPLETE  Result Date: 04/07/2021    ECHOCARDIOGRAM REPORT   Patient Name:   Connor Solomon Date of Exam: 04/07/2021 Medical Rec #:  [id number]     Height:       68.0 in Accession #:    [id number]    Weight:       154.0 lb Date of Birth:  01/08/1961     BSA:          1.829 m Patient Age:    81 years      BP:           114/71 mmHg Patient Gender: M             HR:           99 bpm. Exam Location:  Inpatient Procedure: 2D Echo Indications:    dyspnea  History:        Patient has prior history of Echocardiogram examinations, most                 recent 09/29/2009. Signs/Symptoms:leg swelling.  Sonographer:    Johny Chess Referring Phys: [place]  1. Left ventricular ejection fraction, by estimation, is 20 to 25%. The left ventricle has severely decreased function. The left ventricle demonstrates global hypokinesis. The left ventricular internal cavity size was moderately dilated. Left ventricular diastolic parameters are consistent with Grade III diastolic dysfunction (restrictive). Elevated left atrial pressure.  2. Right ventricular systolic function is normal. The right ventricular size is normal. Tricuspid regurgitation signal is inadequate for assessing PA pressure.  3. Left atrial size was severely dilated.  4. The mitral valve is normal in structure. Moderate to severe mitral valve regurgitation.  5. The aortic  valve is tricuspid. Aortic valve regurgitation is trivial. No aortic stenosis is present.  6. The inferior vena cava is dilated in size with >50% respiratory variability, suggesting right atrial pressure of 8 mmHg. Comparison(s): Prior images unable to be directly viewed, comparison made by report only. FINDINGS  Left Ventricle: Left ventricular ejection fraction, by estimation, is 20 to 25%. The left ventricle has severely decreased function. The left ventricle demonstrates global hypokinesis. The left ventricular internal cavity size was  moderately dilated. There is no left ventricular hypertrophy. Left ventricular diastolic parameters are consistent with Grade III diastolic dysfunction (restrictive). Elevated left atrial pressure. Right Ventricle: The right ventricular size is normal. No increase in right ventricular wall thickness. Right ventricular systolic function is normal. Tricuspid regurgitation signal is inadequate for assessing PA pressure. Left Atrium: Left atrial size was severely dilated. Right Atrium: Right atrial size was normal in size. Pericardium: There is no evidence of pericardial effusion. Mitral Valve: The mitral valve is normal in structure. Moderate to severe mitral valve regurgitation, with centrally-directed jet. Tricuspid Valve: The tricuspid valve is normal in structure. Tricuspid valve regurgitation is not demonstrated. Aortic Valve: The aortic valve is tricuspid. Aortic valve regurgitation is trivial. No aortic stenosis is present. Pulmonic Valve: The pulmonic valve was grossly normal. Pulmonic valve regurgitation is not visualized. Aorta: The aortic root and ascending aorta are structurally normal, with no evidence of dilitation. Venous: The inferior vena cava is dilated in size with greater than 50% respiratory variability, suggesting right atrial pressure of 8 mmHg. IAS/Shunts: There is right bowing of the interatrial septum, suggestive of elevated left atrial pressure. No atrial level shunt detected by color flow Doppler.  LEFT VENTRICLE PLAX 2D LVIDd:         6.30 cm      Diastology LVIDs:         5.60 cm      LV e' medial:    8.70 cm/s LV PW:         1.10 cm      LV E/e' medial:  12.9 LV IVS:        1.00 cm      LV e' lateral:   5.55 cm/s LVOT diam:     2.10 cm      LV E/e' lateral: 20.2 LV SV:         47 LV SV Index:   26 LVOT Area:     3.46 cm  LV Volumes (MOD) LV vol d, MOD A2C: 180.0 ml LV vol d, MOD A4C: 205.0 ml LV vol s, MOD A2C: 129.0 ml LV vol s, MOD A4C: 145.0 ml LV SV MOD A2C:     51.0 ml LV SV MOD  A4C:     205.0 ml LV SV MOD BP:      58.2 ml RIGHT VENTRICLE             IVC RV S prime:     12.60 cm/s  IVC diam: 2.10 cm TAPSE (M-mode): 1.4 cm LEFT ATRIUM              Index       RIGHT ATRIUM           Index LA diam:        4.60 cm  2.52 cm/m  RA Area:     14.80 cm LA Vol (A2C):   124.0 ml 67.80 ml/m RA Volume:   36.60 ml  20.01 ml/m LA Vol (A4C):   85.9 ml  46.97 ml/m LA Biplane Vol: 103.0 ml 56.32 ml/m  AORTIC VALVE LVOT Vmax:   84.40 cm/s LVOT Vmean:  56.100 cm/s LVOT VTI:    0.136 m  AORTA Ao Root diam: 3.00 cm Ao Asc diam:  3.50 cm MITRAL VALVE MV Area (PHT): 5.31 cm      SHUNTS MV Decel Time: 143 msec      Systemic VTI:  0.14 m MR Peak grad:    108.6 mmHg  Systemic Diam: 2.10 cm MR Mean grad:    67.0 mmHg MR Vmax:         521.00 cm/s MR Vmean:        383.0 cm/s MR PISA:         1.01 cm MR PISA Eff ROA: 8 mm MR PISA Radius:  0.40 cm MV E velocity: 112.00 cm/s MV A velocity: 82.30 cm/s MV E/A ratio:  1.36 Mihai Croitoru MD Electronically signed by Sanda Klein MD Signature Date/Time: 04/07/2021/6:34:38 PM    Final      Medications:     Current Medications:  enoxaparin (LOVENOX) injection  40 mg Subcutaneous Q24H   furosemide  40 mg Intravenous BID   losartan  25 mg Oral Daily   multivitamin with minerals  1 tablet Oral Daily   ramelteon  8 mg Oral QHS    Infusions:   Assessment/Plan   1. Acute Systolic Heart Failure     - Stress Echo 2010: normal EF. No evidence of ischemia     - Echo 6/22: EF 20-25%, GIIDD (restrictive), no LVH. RV normal      - ? Viral CM (COVID + 2021), ? Familia (FH of SCD). TSH normal, HIV pending      - HS trop trend flat and low level but will need LHC to r/o CAD (+ h/o CP)     - Volume status and symptoms improved, Plan diagnostic [facility] tomorrow     -  If obstructive CAD, may hold intervention if not critical, until malignancy w/u completed. ? Need for future surgeries     -  Also ? Infiltrative CM       - cMRI if cath unrevealing       - check  multiple myeloma panel and urine immunofixation       - start spiro 12.5 mg daily       - Continue Losartan 25 daily. Transition later to [facility] if BP stable      - SGLT2i soon, pending Hgb A1c   2.  Mitral Regurgitation      - mod-severe, likely functional from dilated LV     - repeat limited echo after diuresis   3.  Iron Deficiency Anemia       - Hgb 8.6>>9.0      - Iron stores low, will plan feraheme post cMRI       - needs colonoscopy. Unintentional 20 lb wt loss concerning  4.  Unintentional Weight Loss       - Primary team pursuing autoimmune and malignancy w/u    Length of Stay: 0  Brittainy Simmons, PA-C  04/08/2021, 2:17 PM  Advanced Heart Failure Team Pager [phone] (M-F; 7a - 5p)  Please contact [facility] for night-coverage after hours (4p -7a ) and weekends on [email or web address]  Patient seen and examined with the above-signed Advanced Practice Provider and/or Housestaff. I personally reviewed laboratory data, imaging studies and relevant notes. I independently examined the patient and formulated the important aspects of the plan. I  have edited the note to reflect any of my changes or salient points. I have personally discussed the plan with the patient and/or family.  60 y/o male with recent unintentional weight loss admitted with acute HF. Echo EF 20-25% . ECG with LVH otherwise unremarkable. Hstrop negative. Has IDA and ESR 142 (!) CT C/A/P unremarkable.   General:  thin male Weak appearing. No resp difficulty HEENT: normal + temporal wasting  Neck: supple. no JVD. Carotids 2+ bilat; no bruits. No lymphadenopathy or thryomegaly appreciated. Cor: PMI nondisplaced. Regular rate & rhythm. No rubs, gallops or murmurs. Lungs: clear Abdomen: soft, nontender, nondistended. No hepatosplenomegaly. No bruits or masses. Good bowel sounds. Extremities: no cyanosis, clubbing, rash, edema Neuro: alert & orientedx3, cranial nerves grossly intact. moves all 4 extremities w/o  difficulty. Affect pleasant  Will plan R/L cath to further evaluate for ischemic causes for CM but main concern is for underlying malignancy or auto-immune process. W/u underway. Will check CEA and LDH. D/w Dr. Evette Doffing.   Glori Bickers, MD  7:14 PM

## 2021-04-08 NOTE — Progress Notes (Addendum)
Heart Failure Stewardship Pharmacist Progress Note   PCP: [name], NP PCP-Cardiologist: None    HPI:  60 yo M w/ PMH of HTN, anxiety, arthritis presenting w/ 6 month hx of dyspnea on exertion worsening over the last 3 weeks. Now endorses SOB at rest, orthopnea requiring 3 pillows to sleep, chest pressure on exertion, bilateral LEE, and N/V with eating. ECHO revealed EF 20-25%  Current HF Medications: - Furosemide $RemoveBefo'40mg'[id number]$  IV BID - Losartan $RemoveBe'25mg'JKDuikPKS$  daily  Prior to admission HF Medications: N/A  Pertinent Lab Values: Serum creatinine 0.88, BUN 12, Potassium 4.0, Sodium 134, BNP 380.4, Magnesium 2.0 Ferritin 160, TSAT 8  Vital Signs: Weight: 149 lbs (admission weight: 150 lbs) Blood pressure: 110/70s Heart rate: 80-90s  Medication Assistance / Insurance Benefits Check: Does the patient have prescription insurance?  No  Does the patient qualify for medication assistance through manufacturers or grants?   Yes Eligible grants and/or patient assistance programs: pending Medication assistance applications in progress: N/A  Medication assistance applications approved: N/A Approved medication assistance renewals will be completed by: pending  Outpatient Pharmacy:  Prior to admission outpatient pharmacy: [facility] Is the patient willing to use [facility] at discharge? Pending Is the patient willing to transition their outpatient pharmacy to utilize a [facility] outpatient pharmacy?   Pending    Assessment/Plan: 1. New chronic systolic CHF (EF 78-29%). Stress test ECHO in 2010 with normal EF and no evidence of ischemia. Recommend repeat ischemic evaluation during this admission.  - Received two doses of IV furosemide $RemoveBefor'40mg'[id number]$ , UOP 1250cc (net -910 ml).  - Hold off BB while volume overloaded and with severe LV dysfunction - Continue losartan 25 mg daily. Consider transitioning to [facility] if BP allows. - Consider adding spironolactone 12.5 mg daily - Consider adding SGLT2i prior to  discharge pending repeat A1c (last checked 07/2020 - 5.2) - Consider IV Feraheme prior to discharge with iron panel low   2) Patient assistance: - Patient is uninsured - will require patient assistance for [place], Farxiga/Jardiance   Michela Pitcher) [place], PharmD Student

## 2021-04-08 NOTE — H&P (View-Only) (Signed)
Advanced Heart Failure Team Consult Note   Primary Physician: Connor Perna, NP PCP-Cardiologist:  None  Reason for Consultation: Acute Systolic Heart Failure   HPI:    Connor Solomon is seen today for evaluation of new acute systolic heart failure at the request of Dr. Evette Doffing, Internal Medicine.   60 y/o WM w/ h/o HTN and FH of SCD but no other CRFs.  His father had SCD and died at home at the age of 90. Pt had stress echo in 2010 for CP showing no stress arrhythmias or  conduction abnormalities. The stress ECG was negative for ischemia. There was no echocardiographic evidence for stress-induced ischemia. EF normal. In 2011, he had syncope and evaluated by Dr. Harrington Challenger but this was felt to be vasovagal syncope. No further cardiac w/u.   Tested + for COVID 5/21. Did not require hospitalization but reports he hasn't felt well since. Not vaccinated.   For the last 6 months he has noted marked fatigue + 20 lb unintentional wt loss and some occasional night sweats.   3 weeks ago, he developed dyspnea that has progressively worsened. Initially DOE progressing to resting dyspnea, orthopnea + PND and LEE. Also recent chest pain described and substernal chest tightness. Given symptoms, he came to the ED where he was found to be in acute CHF. BNP 380. CXR showed patchy and indistinct right lung base opacity, nonspecific, but suspicious for bronchopneumonia. Also could not r/o underlying malignancy. D-dimer 1.20. CT negative for PE but + for moderate bilateral pleural effusions and patchy airspace opacities consistent with multifocal pneumonia. COVID and Flu negative. WBC normal. AF. HS trop 32>>30>>23. EKG NSR 92 bpm w/ LVH. Also noted to be anemic w/ hgb 8.6. Iron studies c/w IDA. He has never had a colonoscopy. Denies melena and hematochezia.   He was admitted by IM and started on IV Lasix. Echo obtained showing severely reduced LVEF, 20-25% w/ glogal HK, Grade III DD (restrictive), RV normal.  Mod-severe MR. No LVH.   Wt is down 5 lb w/ diuresis. He reports feeling better. Not significantly volume overloaded on exam today. Scr stable at 0.88. K 4.0  He does report family h/o cancers. 2 brothers had lung cancer, sister w/ brain cancer, 1 brother currently undergoing tx for "blood cancer".   He is married w/ children. Works as a Furniture conservator/restorer. Denies tobacco, ETOH or elicit drug use.     Echo 04/07/21  1. Left ventricular ejection fraction, by estimation, is 20 to 25%. The  left ventricle has severely decreased function. The left ventricle  demonstrates global hypokinesis. The left ventricular internal cavity size  was moderately dilated. Left  ventricular diastolic parameters are consistent with Grade III diastolic  dysfunction (restrictive). Elevated left atrial pressure.   2. Right ventricular systolic function is normal. The right ventricular  size is normal. Tricuspid regurgitation signal is inadequate for assessing  PA pressure.   3. Left atrial size was severely dilated.   4. The mitral valve is normal in structure. Moderate to severe mitral  valve regurgitation.   5. The aortic valve is tricuspid. Aortic valve regurgitation is trivial.  No aortic stenosis is present.   6. The inferior vena cava is dilated in size with >50% respiratory  variability, suggesting right atrial pressure of 8 mmHg.     Review of Systems: [y] = yes, $Remo'[ ]'QOLUv$  = no   General: Weight gain $RemoveBef'[ ]'ldxGzNCoaG$ ; Weight loss [Y ]; Anorexia [ Y]; Fatigue [Y ]; Fever $Remove'[ ]'hxWVBDg$ ;  Chills [ ] ; Weakness [ Y]  Cardiac: Chest pain/pressure [ Y]; Resting SOB [ ] ; Exertional SOB [ Y]; Orthopnea [ Y]; Pedal Edema [ ] ; Palpitations [ ] ; Syncope [ ] ; Presyncope [ ] ; Paroxysmal nocturnal dyspnea[ Y]  Pulmonary: Cough [ ] ; Wheezing[ ] ; Hemoptysis[ ] ; Sputum [ ] ; Snoring [ ]   GI: Vomiting[ ] ; Dysphagia[ ] ; Melena[ ] ; Hematochezia [ ] ; Heartburn[ ] ; Abdominal pain [ ] ; Constipation [ ] ; Diarrhea [ ] ; BRBPR [ ]   GU: Hematuria[ ] ; Dysuria [ ] ;  Nocturia[ ]   Vascular: Pain in legs with walking [ ] ; Pain in feet with lying flat [ ] ; Non-healing sores [ ] ; Stroke [ ] ; TIA [ ] ; Slurred speech [ ] ;  Neuro: Headaches[ ] ; Vertigo[ ] ; Seizures[ ] ; Paresthesias[ ] ;Blurred vision [ ] ; Diplopia [ ] ; Vision changes [ ]   Ortho/Skin: Arthritis [ ] ; Joint pain [ ] ; Muscle pain [ ] ; Joint swelling [ ] ; Back Pain [ ] ; Rash [ ]   Psych: Depression[ ] ; Anxiety[ ]   Heme: Bleeding problems [ ] ; Clotting disorders [ ] ; Anemia [Y ]  Endocrine: Diabetes [ ] ; Thyroid dysfunction[ ]   Home Medications Prior to Admission medications   Medication Sig Start Date End Date Taking? Authorizing Provider  albuterol (VENTOLIN HFA) 108 (90 Base) MCG/ACT inhaler Inhale 2 puffs into the lungs every 6 (six) hours as needed for wheezing or shortness of breath. 03/11/21  Yes Icard, Bradley L, DO  ferrous sulfate 300 (60 Fe) MG/5ML syrup Take 5 mLs (300 mg total) by mouth daily. 03/27/21  Yes Connor Perna, NP    Past Medical History: Past Medical History:  Diagnosis Date   Arthritis    hands, elbows bilaterally   Full dentures    Hypertension    Pt states he doen not have HTN and has never been treated for HTN   Kidney stone on left side last stone 4-5 yrs ago   recurrent (3 episodes)   Panic disorder    pt states has resolved   Syncope    resolved- was related to panic attacks    Past Surgical History: Past Surgical History:  Procedure Laterality Date   CARPAL METACARPAL FUSION WITH DISTAL RADIAL BONE GRAFT Left 05/30/2013   Procedure: LEFT THUMB METACARPAL JOINT FUSION;  Surgeon: Schuyler Amor, MD;  Location: [place];  Service: Orthopedics;  Laterality: Left;   ESOPHAGOGASTRODUODENOSCOPY N/A 02/12/2014   Procedure: ESOPHAGOGASTRODUODENOSCOPY (EGD);  Surgeon: Jerene Bears, MD;  Location: [facility] ENDOSCOPY;  Service: Endoscopy;  Laterality: N/A;   FOREIGN BODY REMOVAL N/A 02/12/2014   Procedure: FOREIGN BODY REMOVAL;  Surgeon: Jerene Bears, MD;   Location: [place];  Service: Endoscopy;  Laterality: N/A;   OPEN REDUCTION INTERNAL FIXATION (ORIF) DISTAL RADIAL FRACTURE Left 11/29/2012   Procedure: OPEN REDUCTION INTERNAL FIXATION (ORIF) DISTAL RADIAL FRACTURE;  Surgeon: Schuyler Amor, MD;  Location: [place];  Service: Orthopedics;  Laterality: Left;  LEFT DISTAL RADIUS OSTEOTOMY WITH BONE GRAFT   TENDON REPAIR Left 02/07/2013   Procedure: LEFT EXTENSOR INDICIS PROPRIUS  TO EXTENSOR POLLICIS LONGUS TENDON TRANSFER;  Surgeon: Schuyler Amor, MD;  Location: [place];  Service: Orthopedics;  Laterality: Left;   WISDOM TOOTH EXTRACTION     WRIST SURGERY     fx only    Family History: Family History  Problem Relation Age of Onset   Heart attack Father    Diabetes Neg Hx    Hyperlipidemia Neg Hx    Hypertension  Neg Hx     Social History: Social History   Socioeconomic History   Marital status: Single    Spouse name: Not on file   Number of children: Not on file   Years of education: Not on file   Highest education level: Not on file  Occupational History   Not on file  Tobacco Use   Smoking status: Former    Pack years: 0.00    Types: Cigars    Quit date: 10/11/2020    Years since quitting: 0.4   Smokeless tobacco: Never   Tobacco comments:    smokes 1 cigar a week  Substance and Sexual Activity   Alcohol use: No   Drug use: No   Sexual activity: Not on file  Other Topics Concern   Not on file  Social History Narrative   Not on file   Social Determinants of Health   Financial Resource Strain: Not on file  Food Insecurity: Not on file  Transportation Needs: Not on file  Physical Activity: Not on file  Stress: Not on file  Social Connections: Not on file    Allergies:  Allergies  Allergen Reactions   Hydrocodone Itching and Nausea And Vomiting    Objective:    Vital Signs:   Temp:  [97.6 F (36.4 C)-98.2 F (36.8 C)] 98 F (36.7 C) (06/29 1141) Pulse  Rate:  [59-102] 80 (06/29 1141) Resp:  [14-26] 16 (06/29 1141) BP: (106-143)/(67-88) 111/79 (06/29 1141) SpO2:  [95 %-100 %] 98 % (06/29 1141) Weight:  [68 kg-68.4 kg] 68 kg (06/29 0513) Last BM Date: 04/06/21  Weight change: Filed Weights   04/07/21 0943 04/07/21 1627 04/08/21 0513  Weight: 69.9 kg 68.4 kg 68 kg    Intake/Output:   Intake/Output Summary (Last 24 hours) at 04/08/2021 1417 Last data filed at 04/08/2021 1300 Gross per 24 hour  Intake 340 ml  Output 1250 ml  Net -910 ml      Physical Exam    General:  fatigued appearing. No resp difficulty HEENT: normal Neck: supple. JVP 7 cm . Carotids 2+ bilat; no bruits. No lymphadenopathy or thyromegaly appreciated. Cor: PMI nondisplaced. Regular rate & rhythm. 2/6 MR at apex Lungs: clear Abdomen: soft, nontender, nondistended. No hepatosplenomegaly. No bruits or masses. Good bowel sounds. Extremities: no cyanosis, clubbing, rash, edema Neuro: alert & orientedx3, cranial nerves grossly intact. moves all 4 extremities w/o difficulty. Affect pleasant   Telemetry   NSR, 7 beats NSVT  EKG    NSR 99 bpm, LVH, nonspecific Twave abnormalities   Labs   Basic Metabolic Panel: Recent Labs  Lab 04/07/21 1013 04/08/21 0410  NA 135 134*  K 3.7 4.0  CL 99 101  CO2 22 23  GLUCOSE 95 111*  BUN 7 12  CREATININE 0.75 0.88  CALCIUM 8.8* 8.8*  MG  --  2.0    Liver Function Tests: Recent Labs  Lab 04/08/21 0410  AST 13*  ALT 12  ALKPHOS 72  BILITOT 0.6  PROT 7.1  ALBUMIN 2.5*   No results for input(s): LIPASE, AMYLASE in the last 168 hours. No results for input(s): AMMONIA in the last 168 hours.  CBC: Recent Labs  Lab 04/07/21 1013 04/08/21 0410  WBC 8.0 9.7  NEUTROABS  --  6.6  HGB 9.0* 9.2*  HCT 29.7* 29.0*  MCV 78.2* 75.9*  PLT 459* 451*    Cardiac Enzymes: No results for input(s): CKTOTAL, CKMB, CKMBINDEX, TROPONINI in the last 168 hours.  BNP: BNP (  last 3 results) Recent Labs     04/07/21 1013  BNP 380.4*    ProBNP (last 3 results) No results for input(s): PROBNP in the last 8760 hours.   CBG: No results for input(s): GLUCAP in the last 168 hours.  Coagulation Studies: No results for input(s): LABPROT, INR in the last 72 hours.   Imaging   CT ABDOMEN PELVIS W CONTRAST  Result Date: 04/07/2021 CLINICAL DATA:  Unintended weight loss, iron deficiency anemia EXAM: CT ABDOMEN AND PELVIS WITH CONTRAST TECHNIQUE: Multidetector CT imaging of the abdomen and pelvis was performed using the standard protocol following bolus administration of intravenous contrast. CONTRAST:  113mL OMNIPAQUE IOHEXOL 300 MG/ML  SOLN COMPARISON:  05/31/2020 FINDINGS: Lower chest: Small to moderate bilateral pleural effusions. Dependent atelectasis in the lower lobes. Heart is normal size. Hepatobiliary: No focal hepatic abnormality. Gallbladder unremarkable. Pancreas: No focal abnormality or ductal dilatation. Spleen: No focal abnormality.  Normal size. Adrenals/Urinary Tract: No adrenal abnormality. No focal renal abnormality. No stones or hydronephrosis. Urinary bladder is unremarkable. Stomach/Bowel: Stomach, large and small bowel grossly unremarkable. Normal appendix. Vascular/Lymphatic: Mildly prominent retroperitoneal lymph nodes. Left periaortic node has a short axis diameter of 13 mm. These are similar to prior study. Mildly enlarged inguinal lymph nodes bilaterally, left greater than right. Left inguinal index node has a short axis diameter of 10 mm. Reproductive: No visible focal abnormality. Other: No free fluid or free air. Musculoskeletal: No acute bony abnormality. IMPRESSION: Small to moderate bilateral pleural effusions. Dependent atelectasis in the lower lobes. Mildly enlarged retroperitoneal lymph nodes and bilateral inguinal lymph nodes, not significantly changed since prior study. No acute findings in the abdomen or pelvis. Electronically Signed   By: Rolm Baptise M.D.   On:  04/07/2021 23:47   ECHOCARDIOGRAM COMPLETE  Result Date: 04/07/2021    ECHOCARDIOGRAM REPORT   Patient Name:   TYLERJAMES HOGLUND Date of Exam: 04/07/2021 Medical Rec #:  [id number]     Height:       68.0 in Accession #:    [id number]    Weight:       154.0 lb Date of Birth:  Aug 25, 1961     BSA:          1.829 m Patient Age:    1 years      BP:           114/71 mmHg Patient Gender: M             HR:           99 bpm. Exam Location:  Inpatient Procedure: 2D Echo Indications:    dyspnea  History:        Patient has prior history of Echocardiogram examinations, most                 recent 09/29/2009. Signs/Symptoms:leg swelling.  Sonographer:    Johny Chess Referring Phys: [place]  1. Left ventricular ejection fraction, by estimation, is 20 to 25%. The left ventricle has severely decreased function. The left ventricle demonstrates global hypokinesis. The left ventricular internal cavity size was moderately dilated. Left ventricular diastolic parameters are consistent with Grade III diastolic dysfunction (restrictive). Elevated left atrial pressure.  2. Right ventricular systolic function is normal. The right ventricular size is normal. Tricuspid regurgitation signal is inadequate for assessing PA pressure.  3. Left atrial size was severely dilated.  4. The mitral valve is normal in structure. Moderate to severe mitral valve regurgitation.  5. The aortic  valve is tricuspid. Aortic valve regurgitation is trivial. No aortic stenosis is present.  6. The inferior vena cava is dilated in size with >50% respiratory variability, suggesting right atrial pressure of 8 mmHg. Comparison(s): Prior images unable to be directly viewed, comparison made by report only. FINDINGS  Left Ventricle: Left ventricular ejection fraction, by estimation, is 20 to 25%. The left ventricle has severely decreased function. The left ventricle demonstrates global hypokinesis. The left ventricular internal cavity size was  moderately dilated. There is no left ventricular hypertrophy. Left ventricular diastolic parameters are consistent with Grade III diastolic dysfunction (restrictive). Elevated left atrial pressure. Right Ventricle: The right ventricular size is normal. No increase in right ventricular wall thickness. Right ventricular systolic function is normal. Tricuspid regurgitation signal is inadequate for assessing PA pressure. Left Atrium: Left atrial size was severely dilated. Right Atrium: Right atrial size was normal in size. Pericardium: There is no evidence of pericardial effusion. Mitral Valve: The mitral valve is normal in structure. Moderate to severe mitral valve regurgitation, with centrally-directed jet. Tricuspid Valve: The tricuspid valve is normal in structure. Tricuspid valve regurgitation is not demonstrated. Aortic Valve: The aortic valve is tricuspid. Aortic valve regurgitation is trivial. No aortic stenosis is present. Pulmonic Valve: The pulmonic valve was grossly normal. Pulmonic valve regurgitation is not visualized. Aorta: The aortic root and ascending aorta are structurally normal, with no evidence of dilitation. Venous: The inferior vena cava is dilated in size with greater than 50% respiratory variability, suggesting right atrial pressure of 8 mmHg. IAS/Shunts: There is right bowing of the interatrial septum, suggestive of elevated left atrial pressure. No atrial level shunt detected by color flow Doppler.  LEFT VENTRICLE PLAX 2D LVIDd:         6.30 cm      Diastology LVIDs:         5.60 cm      LV e' medial:    8.70 cm/s LV PW:         1.10 cm      LV E/e' medial:  12.9 LV IVS:        1.00 cm      LV e' lateral:   5.55 cm/s LVOT diam:     2.10 cm      LV E/e' lateral: 20.2 LV SV:         47 LV SV Index:   26 LVOT Area:     3.46 cm  LV Volumes (MOD) LV vol d, MOD A2C: 180.0 ml LV vol d, MOD A4C: 205.0 ml LV vol s, MOD A2C: 129.0 ml LV vol s, MOD A4C: 145.0 ml LV SV MOD A2C:     51.0 ml LV SV MOD  A4C:     205.0 ml LV SV MOD BP:      58.2 ml RIGHT VENTRICLE             IVC RV S prime:     12.60 cm/s  IVC diam: 2.10 cm TAPSE (M-mode): 1.4 cm LEFT ATRIUM              Index       RIGHT ATRIUM           Index LA diam:        4.60 cm  2.52 cm/m  RA Area:     14.80 cm LA Vol (A2C):   124.0 ml 67.80 ml/m RA Volume:   36.60 ml  20.01 ml/m LA Vol (A4C):   85.9 ml  46.97 ml/m LA Biplane Vol: 103.0 ml 56.32 ml/m  AORTIC VALVE LVOT Vmax:   84.40 cm/s LVOT Vmean:  56.100 cm/s LVOT VTI:    0.136 m  AORTA Ao Root diam: 3.00 cm Ao Asc diam:  3.50 cm MITRAL VALVE MV Area (PHT): 5.31 cm      SHUNTS MV Decel Time: 143 msec      Systemic VTI:  0.14 m MR Peak grad:    108.6 mmHg  Systemic Diam: 2.10 cm MR Mean grad:    67.0 mmHg MR Vmax:         521.00 cm/s MR Vmean:        383.0 cm/s MR PISA:         1.01 cm MR PISA Eff ROA: 8 mm MR PISA Radius:  0.40 cm MV E velocity: 112.00 cm/s MV A velocity: 82.30 cm/s MV E/A ratio:  1.36 Mihai Croitoru MD Electronically signed by Sanda Klein MD Signature Date/Time: 04/07/2021/6:34:38 PM    Final      Medications:     Current Medications:  enoxaparin (LOVENOX) injection  40 mg Subcutaneous Q24H   furosemide  40 mg Intravenous BID   losartan  25 mg Oral Daily   multivitamin with minerals  1 tablet Oral Daily   ramelteon  8 mg Oral QHS    Infusions:   Assessment/Plan   1. Acute Systolic Heart Failure     - Stress Echo 2010: normal EF. No evidence of ischemia     - Echo 6/22: EF 20-25%, GIIDD (restrictive), no LVH. RV normal      - ? Viral CM (COVID + 2021), ? Familia (FH of SCD). TSH normal, HIV pending      - HS trop trend flat and low level but will need LHC to r/o CAD (+ h/o CP)     - Volume status and symptoms improved, Plan diagnostic [facility] tomorrow     -  If obstructive CAD, may hold intervention if not critical, until malignancy w/u completed. ? Need for future surgeries     -  Also ? Infiltrative CM       - cMRI if cath unrevealing       - check  multiple myeloma panel and urine immunofixation       - start spiro 12.5 mg daily       - Continue Losartan 25 daily. Transition later to [facility] if BP stable      - SGLT2i soon, pending Hgb A1c   2.  Mitral Regurgitation      - mod-severe, likely functional from dilated LV     - repeat limited echo after diuresis   3.  Iron Deficiency Anemia       - Hgb 8.6>>9.0      - Iron stores low, will plan feraheme post cMRI       - needs colonoscopy. Unintentional 20 lb wt loss concerning  4.  Unintentional Weight Loss       - Primary team pursuing autoimmune and malignancy w/u    Length of Stay: 0  Brittainy Simmons, PA-C  04/08/2021, 2:17 PM  Advanced Heart Failure Team Pager [phone] (M-F; 7a - 5p)  Please contact [facility] for night-coverage after hours (4p -7a ) and weekends on [email or web address]  Patient seen and examined with the above-signed Advanced Practice Provider and/or Housestaff. I personally reviewed laboratory data, imaging studies and relevant notes. I independently examined the patient and formulated the important aspects of the plan. I  have edited the note to reflect any of my changes or salient points. I have personally discussed the plan with the patient and/or family.  60 y/o male with recent unintentional weight loss admitted with acute HF. Echo EF 20-25% . ECG with LVH otherwise unremarkable. Hstrop negative. Has IDA and ESR 142 (!) CT C/A/P unremarkable.   General:  thin male Weak appearing. No resp difficulty HEENT: normal + temporal wasting  Neck: supple. no JVD. Carotids 2+ bilat; no bruits. No lymphadenopathy or thryomegaly appreciated. Cor: PMI nondisplaced. Regular rate & rhythm. No rubs, gallops or murmurs. Lungs: clear Abdomen: soft, nontender, nondistended. No hepatosplenomegaly. No bruits or masses. Good bowel sounds. Extremities: no cyanosis, clubbing, rash, edema Neuro: alert & orientedx3, cranial nerves grossly intact. moves all 4 extremities w/o  difficulty. Affect pleasant  Will plan R/L cath to further evaluate for ischemic causes for CM but main concern is for underlying malignancy or auto-immune process. W/u underway. Will check CEA and LDH. D/w Dr. Evette Doffing.   Glori Bickers, MD  7:14 PM

## 2021-04-08 NOTE — Progress Notes (Addendum)
Subjective: [name] is a 60 year old male with a pertinent medical history of HTN and arthritis here for new onset HFrEF (EF 20-25%) of unknown etiology. Overnight he was diuresed with IV Lasix 40 mg and feels that his breathing is somewhat improved. He was able to lie flat while sleeping last night, even though he needs three pillows or to sit in the recliner to sleep comfortably at home. He is still endorsing some chest pressure but not pain. He shares that over the last month or so, he has had occasional fevers and night sweats. He also states that he has joint pain and morning stiffness in his hands.   Objective:  Vital signs in last 24 hours: Vitals:   04/08/21 0513 04/08/21 0831 04/08/21 1009 04/08/21 1141  BP: 106/67 113/76 116/81 111/79  Pulse: (!) 59 90 85 80  Resp: _0 Temp: 97.9 F (36.6 C) 97.6 F (36.4 C) 98 F (36.7 C) 98 F (36.7 C)  TempSrc: Oral Oral Oral Oral  SpO2: 95% 97% 100% 98%  Weight: 68 kg     Height:       Weight change:   Intake/Output Summary (Last 24 hours) at 04/08/2021 1144 Last data filed at 04/08/2021 0018 Gross per 24 hour  Intake 100 ml  Output 1250 ml  Net -1150 ml   Constitutional: tired-appearing elderly man, resting in bed, in no acute distress HENT:normocephalic, atraumatic Eyes: conjunctiva non-erythematous, no scleral icterus Cardiovascular: regular rate and rhythm, no extra heart sounds, JVD to 2 cm Pulmonary/Chest: labored breathing on room air, dullness to percussion R>L Abdominal: soft, non-tender, non-distended MSK: no gross deficits Neurological: alert & oriented x 3 Skin: warm and dry, well perfused Psych: affect mood congruent   Assessment/Plan:  Principal Problem:   Acute HFrEF (heart failure with reduced ejection fraction) (HCC) Active Problems:   Benign essential hypertension   Weight loss, unintentional   Microcytic anemia   Acute heart failure (HCC)   Mitral regurgitation  Acute HFrEF (EF  20-25%); New diagnosis Presenting for approximately 6 months of dyspnea on exertion and orthopnea that is recently worsening in the setting of elevated bilateral pleural effusions. TTE showed LVEF of 20-25%, global hypokinesis, dilatation of LV cavity, and grade III diastolic dysfunction. He is net negative 1.15 L s/p diuresis with IV Lasix 40 mg x1 and endorses improvement in breathing. At this time, it is unclear as to whether the HF is ischemic or non-ischemic/infiltrative in etiology. Factors pointing away from ischemia include LDL of 49, Hgb A1c 5.2 (8 months ago) minimal tobacco use, however given his age and family hx of CAD, we cannot definitively rule it out. From a non-ischemic standpoint, our differential can include infiltrative disease such as amyloidosis, sarcoidosis and hemochromatosis but these are less likely given the lack of LVH and normal ferritin levels respectively. Of note, he is endorsing B symptoms and CRP and ESR are elevated hence the HF could be auto-immune/infectious in etiology. We suspect that he is currently hypervolemic, therefore will continue diuresis with Lasix. Cardiology has also been consulted. - Start Losartan 25 mg Daily. - IV Lasix 40 mg BID - Follow up HIV antigen - Optimize GDMT as tolerable and affordable - Daily BMP and Mg - Daily weights - Strict I/Os - Tele  Unintentional Weight loss; Current Inflammatory State Endorsing intermittent fevers and night sweats over the last month in the setting of joint pain, morning stiffness and 25 pound unintentional weight loss in  the last year. CRP 7.6 and ESR>140. CBC shows normal WBC with thrombocytosis. No rashes visualized on physical exam. CT AP unremarkable except for retroperitoneal and inguinal lymphadenopathy. Will pursue autoimmune work-up, however malignancy is still on the differential given weight loss, B symptoms, and strong family hx of malignancy. Will send autoimmune labs for diagnostic clarification. -  follow up autoimmune labs:  - Complement total, C3, C4  - anti-CCP, RF  - Anti-dsDNA, ENA, ANA - Checking serum immunofixation and free light chains - follow up LDH - consider outpatient colonoscopy  Anemia of Inflammation Patient has a stable Hgb of 9.2 with decreased MCV of 75.9. These findings are chronic. Iron studies show normal ferritin, decreased iron of 21 and normal TIBC. Denies melena/hematochezia. Given elevated CRP, elevated ESR, normal ferritin levels, thrombocytosis, B symptoms and weight loss, etiology is more likely anemia of inflammatory disease. Will pursue autoimmune and malignancy workup as mentioned above for diagnostic clarification.  - follow up Reticulocyte count - Peripheral Blood smear - daily CBC  Diet: Sodium restricted DVT Ppx: SubQ heparin Fluids: PRN Code Status: Full    LOS: 0 days   Ok Edwards, Medical Student 04/08/2021, 11:44 AM

## 2021-04-09 ENCOUNTER — Encounter (HOSPITAL_COMMUNITY)
Admission: EM | Disposition: A | Discharge status: Home / Self Care | Payer: Self-pay | Source: Home / Self Care | Attending: Student in an Organized Health Care Education/Training Program

## 2021-04-09 HISTORY — PX: RIGHT/LEFT HEART CATH AND CORONARY ANGIOGRAPHY: CATH118266

## 2021-04-09 LAB — CBC WITH DIFFERENTIAL/PLATELET
Abs Immature Granulocytes: 0.12 10*3/uL — ABNORMAL HIGH (ref 0.00–0.07)
Basophils Absolute: 0.1 10*3/uL (ref 0.0–0.1)
Basophils Relative: 1 %
Eosinophils Absolute: 0.5 10*3/uL (ref 0.0–0.5)
Eosinophils Relative: 5 %
HCT: 30 % — ABNORMAL LOW (ref 39.0–52.0)
Hemoglobin: 9.5 g/dL — ABNORMAL LOW (ref 13.0–17.0)
Immature Granulocytes: 1 %
Lymphocytes Relative: 15 %
Lymphs Abs: 1.6 10*3/uL (ref 0.7–4.0)
MCH: 23.9 pg — ABNORMAL LOW (ref 26.0–34.0)
MCHC: 31.7 g/dL (ref 30.0–36.0)
MCV: 75.4 fL — ABNORMAL LOW (ref 80.0–100.0)
Monocytes Absolute: 1 10*3/uL (ref 0.1–1.0)
Monocytes Relative: 9 %
Neutro Abs: 7.8 10*3/uL — ABNORMAL HIGH (ref 1.7–7.7)
Neutrophils Relative %: 69 %
Platelets: 525 10*3/uL — ABNORMAL HIGH (ref 150–400)
RBC: 3.98 MIL/uL — ABNORMAL LOW (ref 4.22–5.81)
RDW: 17.2 % — ABNORMAL HIGH (ref 11.5–15.5)
WBC: 11.1 10*3/uL — ABNORMAL HIGH (ref 4.0–10.5)
nRBC: 0 % (ref 0.0–0.2)

## 2021-04-09 LAB — HEMOGLOBIN A1C
Hgb A1c MFr Bld: 5.6 % (ref 4.8–5.6)
Mean Plasma Glucose: 114 mg/dL

## 2021-04-09 LAB — POCT I-STAT EG7
Acid-Base Excess: 2 mmol/L (ref 0.0–2.0)
Acid-Base Excess: 3 mmol/L — ABNORMAL HIGH (ref 0.0–2.0)
Bicarbonate: 26.6 mmol/L (ref 20.0–28.0)
Bicarbonate: 27.6 mmol/L (ref 20.0–28.0)
Calcium, Ion: 1.16 mmol/L (ref 1.15–1.40)
Calcium, Ion: 1.23 mmol/L (ref 1.15–1.40)
HCT: 27 % — ABNORMAL LOW (ref 39.0–52.0)
HCT: 29 % — ABNORMAL LOW (ref 39.0–52.0)
Hemoglobin: 9.2 g/dL — ABNORMAL LOW (ref 13.0–17.0)
Hemoglobin: 9.9 g/dL — ABNORMAL LOW (ref 13.0–17.0)
O2 Saturation: 66 %
O2 Saturation: 70 %
Potassium: 4 mmol/L (ref 3.5–5.1)
Potassium: 4.2 mmol/L (ref 3.5–5.1)
Sodium: 135 mmol/L (ref 135–145)
Sodium: 138 mmol/L (ref 135–145)
TCO2: 28 mmol/L (ref 22–32)
TCO2: 29 mmol/L (ref 22–32)
pCO2, Ven: 40.7 mmHg — ABNORMAL LOW (ref 44.0–60.0)
pCO2, Ven: 41.7 mmHg — ABNORMAL LOW (ref 44.0–60.0)
pH, Ven: 7.423 (ref 7.250–7.430)
pH, Ven: 7.429 (ref 7.250–7.430)
pO2, Ven: 34 mmHg (ref 32.0–45.0)
pO2, Ven: 36 mmHg (ref 32.0–45.0)

## 2021-04-09 LAB — RETICULOCYTES
Immature Retic Fract: 20.9 % — ABNORMAL HIGH (ref 2.3–15.9)
RBC.: 4.01 MIL/uL — ABNORMAL LOW (ref 4.22–5.81)
Retic Count, Absolute: 72.2 10*3/uL (ref 19.0–186.0)
Retic Ct Pct: 1.8 % (ref 0.4–3.1)

## 2021-04-09 LAB — C4 COMPLEMENT: Complement C4, Body Fluid: 25 mg/dL (ref 12–38)

## 2021-04-09 LAB — POCT I-STAT 7, (LYTES, BLD GAS, ICA,H+H)
Acid-Base Excess: 1 mmol/L (ref 0.0–2.0)
Bicarbonate: 24.3 mmol/L (ref 20.0–28.0)
Calcium, Ion: 1.02 mmol/L — ABNORMAL LOW (ref 1.15–1.40)
HCT: 26 % — ABNORMAL LOW (ref 39.0–52.0)
Hemoglobin: 8.8 g/dL — ABNORMAL LOW (ref 13.0–17.0)
O2 Saturation: 100 %
Potassium: 3.8 mmol/L (ref 3.5–5.1)
Sodium: 139 mmol/L (ref 135–145)
TCO2: 25 mmol/L (ref 22–32)
pCO2 arterial: 31.3 mmHg — ABNORMAL LOW (ref 32.0–48.0)
pH, Arterial: 7.497 — ABNORMAL HIGH (ref 7.350–7.450)
pO2, Arterial: 167 mmHg — ABNORMAL HIGH (ref 83.0–108.0)

## 2021-04-09 LAB — HIV ANTIBODY (ROUTINE TESTING W REFLEX): HIV Screen 4th Generation wRfx: NONREACTIVE

## 2021-04-09 LAB — BASIC METABOLIC PANEL
Anion gap: 6 (ref 5–15)
BUN: 18 mg/dL (ref 6–20)
CO2: 29 mmol/L (ref 22–32)
Calcium: 8.6 mg/dL — ABNORMAL LOW (ref 8.9–10.3)
Chloride: 97 mmol/L — ABNORMAL LOW (ref 98–111)
Creatinine, Ser: 1.1 mg/dL (ref 0.61–1.24)
GFR, Estimated: >60 mL/min (ref >60)
Glucose, Bld: 108 mg/dL — ABNORMAL HIGH (ref 70–99)
Potassium: 3.7 mmol/L (ref 3.5–5.1)
Sodium: 132 mmol/L — ABNORMAL LOW (ref 135–145)

## 2021-04-09 LAB — PATHOLOGIST SMEAR REVIEW

## 2021-04-09 LAB — C3 COMPLEMENT: C3 Complement: 207 mg/dL — ABNORMAL HIGH (ref 82–167)

## 2021-04-09 LAB — ANTIEXTRACTABLE NUCLEAR AG
ENA SM Ab Ser-aCnc: <0.2 AI (ref 0.0–0.9)
Ribonucleic Protein: 0.7 AI (ref 0.0–0.9)

## 2021-04-09 LAB — ANA W/REFLEX IF POSITIVE: Anti Nuclear Antibody (ANA): NEGATIVE

## 2021-04-09 LAB — ANTI-DNA ANTIBODY, DOUBLE-STRANDED: ds DNA Ab: 1 IU/mL (ref 0–9)

## 2021-04-09 LAB — LACTATE DEHYDROGENASE: LDH: 77 U/L — ABNORMAL LOW (ref 98–192)

## 2021-04-09 LAB — MAGNESIUM: Magnesium: 2 mg/dL (ref 1.7–2.4)

## 2021-04-09 LAB — COMPLEMENT, TOTAL: Compl, Total (CH50): >60 U/mL (ref >41)

## 2021-04-09 LAB — SAVE SMEAR(SSMR), FOR PROVIDER SLIDE REVIEW

## 2021-04-09 LAB — TECHNOLOGIST SMEAR REVIEW

## 2021-04-09 SURGERY — RIGHT/LEFT HEART CATH AND CORONARY ANGIOGRAPHY
Anesthesia: LOCAL

## 2021-04-09 MED ORDER — LIDOCAINE HCL (PF) 1 % IJ SOLN
INTRAMUSCULAR | Status: AC
  Filled 2021-04-09: qty 30

## 2021-04-09 MED ORDER — FENTANYL CITRATE (PF) 100 MCG/2ML IJ SOLN
INTRAMUSCULAR | Status: AC
  Filled 2021-04-09: qty 2

## 2021-04-09 MED ORDER — HYDRALAZINE HCL 20 MG/ML IJ SOLN: 10.0000 mg | INTRAMUSCULAR | Status: AC | PRN

## 2021-04-09 MED ORDER — FENTANYL CITRATE (PF) 100 MCG/2ML IJ SOLN
INTRAMUSCULAR | Status: DC | PRN
  Administered 2021-04-09: 25 ug via INTRAVENOUS

## 2021-04-09 MED ORDER — SODIUM CHLORIDE 0.9% FLUSH: 3.0000 mL | INTRAVENOUS | Status: DC | PRN

## 2021-04-09 MED ORDER — SODIUM CHLORIDE 0.9% FLUSH
3.0000 mL | Freq: Two times a day (BID) | INTRAVENOUS | Status: DC
  Administered 2021-04-09 – 2021-04-10 (×2): 3 mL via INTRAVENOUS

## 2021-04-09 MED ORDER — VERAPAMIL HCL 2.5 MG/ML IV SOLN
INTRAVENOUS | Status: AC
  Filled 2021-04-09: qty 2

## 2021-04-09 MED ORDER — MIDAZOLAM HCL 2 MG/2ML IJ SOLN
INTRAMUSCULAR | Status: AC
  Filled 2021-04-09: qty 2

## 2021-04-09 MED ORDER — SODIUM CHLORIDE 0.9 % IV BOLUS
INTRAVENOUS | Status: AC | PRN
  Administered 2021-04-09: 250 mL via INTRAVENOUS

## 2021-04-09 MED ORDER — HEPARIN SODIUM (PORCINE) 1000 UNIT/ML IJ SOLN
INTRAMUSCULAR | Status: DC | PRN
  Administered 2021-04-09: 3500 [IU] via INTRAVENOUS

## 2021-04-09 MED ORDER — LIDOCAINE HCL (PF) 1 % IJ SOLN
INTRAMUSCULAR | Status: DC | PRN
  Administered 2021-04-09: 4 mL

## 2021-04-09 MED ORDER — VERAPAMIL HCL 2.5 MG/ML IV SOLN
INTRAVENOUS | Status: DC | PRN
  Administered 2021-04-09: 10 mL via INTRA_ARTERIAL

## 2021-04-09 MED ORDER — ACETAMINOPHEN 325 MG PO TABS: 650.0000 mg | ORAL_TABLET | Freq: Four times a day (QID) | ORAL | Status: DC | PRN

## 2021-04-09 MED ORDER — ENOXAPARIN SODIUM 40 MG/0.4ML IJ SOSY
40.0000 mg | PREFILLED_SYRINGE | INTRAMUSCULAR | Status: DC
  Administered 2021-04-10: 40 mg via SUBCUTANEOUS
  Filled 2021-04-09: qty 0.4

## 2021-04-09 MED ORDER — HEPARIN (PORCINE) IN NACL 1000-0.9 UT/500ML-% IV SOLN
INTRAVENOUS | Status: AC
  Filled 2021-04-09: qty 500

## 2021-04-09 MED ORDER — ASPIRIN 81 MG PO CHEW
81.0000 mg | CHEWABLE_TABLET | ORAL | Status: AC
  Administered 2021-04-09: 81 mg via ORAL
  Filled 2021-04-09: qty 1

## 2021-04-09 MED ORDER — IOHEXOL 350 MG/ML SOLN
INTRAVENOUS | Status: DC | PRN
  Administered 2021-04-09: 30 mL

## 2021-04-09 MED ORDER — ONDANSETRON HCL 4 MG/2ML IJ SOLN: 4.0000 mg | Freq: Four times a day (QID) | INTRAMUSCULAR | Status: DC | PRN

## 2021-04-09 MED ORDER — ACETAMINOPHEN 325 MG PO TABS
650.0000 mg | ORAL_TABLET | ORAL | Status: DC | PRN
  Filled 2021-04-09: qty 2

## 2021-04-09 MED ORDER — HEPARIN SODIUM (PORCINE) 1000 UNIT/ML IJ SOLN
INTRAMUSCULAR | Status: AC
  Filled 2021-04-09: qty 1

## 2021-04-09 MED ORDER — MIDAZOLAM HCL 2 MG/2ML IJ SOLN
INTRAMUSCULAR | Status: DC | PRN
  Administered 2021-04-09 (×3): 1 mg via INTRAVENOUS

## 2021-04-09 MED ORDER — SODIUM CHLORIDE 0.9 % IV SOLN: 250.0000 mL | INTRAVENOUS | Status: DC | PRN

## 2021-04-09 MED ORDER — LABETALOL HCL 5 MG/ML IV SOLN: 10.0000 mg | INTRAVENOUS | Status: AC | PRN

## 2021-04-09 MED ORDER — SODIUM CHLORIDE 0.9 % IV SOLN: INTRAVENOUS | Status: AC

## 2021-04-09 MED ORDER — ACETAMINOPHEN 650 MG RE SUPP: 650.0000 mg | Freq: Four times a day (QID) | RECTAL | Status: DC | PRN

## 2021-04-09 MED ORDER — HEPARIN (PORCINE) IN NACL 1000-0.9 UT/500ML-% IV SOLN
INTRAVENOUS | Status: DC | PRN
  Administered 2021-04-09 (×2): 500 mL

## 2021-04-09 SURGICAL SUPPLY — 14 items
CATH 5FR JL3.5 JR4 ANG PIG MP (CATHETERS) ×2 IMPLANT
CATH BALLN WEDGE 5F 110CM (CATHETERS) ×1 IMPLANT
CATH INFINITI 5FR JL4 (CATHETERS) ×1 IMPLANT
DEVICE RAD COMP TR BAND LRG (VASCULAR PRODUCTS) ×1 IMPLANT
GLIDESHEATH SLEND SS 6F .021 (SHEATH) ×2 IMPLANT
GUIDEWIRE .025 260CM (WIRE) ×1 IMPLANT
KIT HEART LEFT (KITS) ×1 IMPLANT
PACK CARDIAC CATHETERIZATION (CUSTOM PROCEDURE TRAY) ×2 IMPLANT
SHEATH GLIDE SLENDER 4/5FR (SHEATH) ×2 IMPLANT
SHEATH PROBE COVER 6X72 (BAG) ×2 IMPLANT
TRANSDUCER W/STOPCOCK (MISCELLANEOUS) ×2 IMPLANT
TUBING CIL FLEX 10 FLL-RA (TUBING) ×1 IMPLANT
WIRE EMERALD 3MM-J .035X150CM (WIRE) ×2 IMPLANT
WIRE HI TORQ VERSACORE J 260CM (WIRE) ×1 IMPLANT

## 2021-04-09 NOTE — Progress Notes (Signed)
Swelling, bruise above the cath site painful to touch observed no bleeding cath lab called nurse from cath lab came hold pressure, swelling is down Coban applied. Will continue to monitor.

## 2021-04-09 NOTE — Progress Notes (Addendum)
Advanced Heart Failure Rounding Note  PCP-Cardiologist: Dr. Haroldine Laws    Patient Profile   60 y/o male with recent unintentional weight loss admitted with acute HF. Echo EF 20-25% . RV normal.  ECG with LVH otherwise unremarkable. Hstrop negative. Has IDA and ESR 142 (!) CT C/A/P unremarkable. 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    Subjective:     Volume status improved w/ diuresis. Wt down 5 lb. Symptoms improved. No further orthopnea. Denies CP. SCr/K stable. Awaiting cath today.    CEA level pending.   Objective:   Weight Range: 67.9 kg Body mass index is 22.76 kg/m.   Vital Signs:   Temp:  [97.6 F (36.4 C)-98.1 F (36.7 C)] 97.6 F (36.4 C) (06/30 0805) Pulse Rate:  [80-99] 87 (06/30 0805) Resp:  [16-20] 18 (06/30 0805) BP: (100-111)/(74-80) 108/76 (06/30 0805) SpO2:  [97 %-100 %] 98 % (06/30 0805) Weight:  [67.9 kg] 67.9 kg (06/30 0409) Last BM Date: 04/06/21  Weight change: Filed Weights   04/07/21 1627 04/08/21 0513 04/09/21 0409  Weight: 68.4 kg 68 kg 67.9 kg    Intake/Output:   Intake/Output Summary (Last 24 hours) at 04/09/2021 1101 Last data filed at 04/09/2021 1041 Gross per 24 hour  Intake 477 ml  Output 2500 ml  Net -2023 ml      Physical Exam    General: thin WM. No resp difficulty HEENT: Normal Neck: Supple. No JVP . Carotids 2+ bilat; no bruits. No lymphadenopathy or thyromegaly appreciated. Cor: PMI nondisplaced. Regular rate & rhythm. No rubs, gallops or murmurs. Lungs: Clear Abdomen: Soft, nontender, nondistended. No hepatosplenomegaly. No bruits or masses. Good bowel sounds. Extremities: No cyanosis, clubbing, rash, edema Neuro: Alert & orientedx3, cranial nerves grossly intact. moves all 4 extremities w/o difficulty. Affect pleasant   Telemetry   NSR 70s   EKG    No new EKG to review   Labs    CBC Recent Labs    04/08/21 0410 04/09/21 0249  WBC 9.7 11.1*  NEUTROABS 6.6 7.8*  HGB 9.2* 9.5*  HCT 29.0*  30.0*  MCV 75.9* 75.4*  PLT 451* 681*   Basic Metabolic Panel Recent Labs    04/08/21 0410 04/09/21 0200  NA 134* 132*  K 4.0 3.7  CL 101 97*  CO2 23 29  GLUCOSE 111* 108*  BUN 12 18  CREATININE 0.88 1.10  CALCIUM 8.8* 8.6*  MG 2.0 2.0   Liver Function Tests Recent Labs    04/08/21 0410  AST 13*  ALT 12  ALKPHOS 72  BILITOT 0.6  PROT 7.1  ALBUMIN 2.5*   No results for input(s): LIPASE, AMYLASE in the last 72 hours. Cardiac Enzymes No results for input(s): CKTOTAL, CKMB, CKMBINDEX, TROPONINI in the last 72 hours.  BNP: BNP (last 3 results) Recent Labs    04/07/21 1013  BNP 380.4*    ProBNP (last 3 results) No results for input(s): PROBNP in the last 8760 hours.   D-Dimer Recent Labs    04/07/21 1013  DDIMER 1.20*   Hemoglobin A1C Recent Labs    04/08/21 1542  HGBA1C 5.6   Fasting Lipid Panel Recent Labs    04/08/21 0813  CHOL 89  HDL 31*  LDLCALC 49  TRIG 46  CHOLHDL 2.9   Thyroid Function Tests Recent Labs    04/08/21 0410  TSH 2.513    Other results:   Imaging    No results found.   Medications:     Scheduled Medications:  enoxaparin (LOVENOX) injection  40 mg Subcutaneous Q24H   losartan  25 mg Oral Daily   multivitamin with minerals  1 tablet Oral Daily   ramelteon  8 mg Oral QHS   sodium chloride flush  3 mL Intravenous Q12H   spironolactone  12.5 mg Oral Daily    Infusions:  sodium chloride     sodium chloride 10 mL/hr at 04/09/21 0524    PRN Medications: sodium chloride, acetaminophen **OR** acetaminophen, albuterol, polyethylene glycol, sodium chloride flush    Patient Profile   60 y/o male with recent unintentional weight loss admitted with acute HF. Echo EF 20-25% . ECG with LVH otherwise unremarkable. Hstrop negative. Has IDA and ESR 142 (!) CT C/A/P unremarkable. Malignancy  and auto-immune w/u in process.   Assessment/Plan   1. Acute Systolic Heart Failure     - Stress Echo 2010: normal EF. No  evidence of ischemia     - Echo 6/22: EF 20-25%, GIIDD (restrictive), no LVH. RV normal     - ? Viral CM (COVID + 2021), ? [facility] ([facility] of [facility]). TSH normal, HIV pending      - HS trop trend flat and low level but will need LHC to r/o CAD (+ h/o CP)     - Volume status and symptoms improved. Hold IV diuretics      - Plan diagnostic R/LHC today. If obstructive CAD, may hold intervention if not critical, until malignancy w/u completed. ? Need for future surgeries     -  Also ? Infiltrative CM      - cMRI if cath unrevealing      - check multiple myeloma panel and urine immunofixation      - c/w autoimmune w/u       - continue spiro 12.5 mg daily      - continue Losartan 25 daily. Transition later to [facility] if BP stable      - SGLT2i soon (Hgb A1c 5.6)   2.  Mitral Regurgitation     - mod-severe, likely functional from dilated LV     - repeat limited echo after diuresis   3.  Iron Deficiency Anemia      - Hgb 8.6>>9.0>9.5       - Iron stores low, will plan feraheme post cMRI      - needs colonoscopy. Unintentional 20 lb wt loss concerning. CEA pending          4.  Unintentional Weight Loss      - Primary team pursuing autoimmune and malignancy w/u      - C/A/P unremarkable      - CEA level pending    Length of Stay: 1  Brittainy Simmons, PA-C  04/09/2021, 11:01 AM  Advanced Heart Failure Team Pager [phone] (M-F; 7a - 5p)  Please contact [facility] for night-coverage after hours (5p -7a ) and weekends on [email or web address]   Patient seen and examined with the above-signed Advanced Practice Provider and/or Housestaff. I personally reviewed laboratory data, imaging studies and relevant notes. I independently examined the patient and formulated the important aspects of the plan. I have edited the note to reflect any of my changes or salient points. I have personally discussed the plan with the patient and/or family.  Good diuresis overnight. Breathing better. Autoimmune and malignancy  w/u ongoing.   General: Thin appearing. No resp difficulty HEENT: normal Neck: supple. no JVD. Carotids 2+ bilat; no bruits. No lymphadenopathy or thryomegaly appreciated. Cor: PMI nondisplaced. Regular rate &  rhythm. No rubs, gallops or murmurs. Lungs: clear Abdomen: soft, nontender, nondistended. No hepatosplenomegaly. No bruits or masses. Good bowel sounds. Extremities: no cyanosis, clubbing, rash, edema Neuro: alert & orientedx3, cranial nerves grossly intact. moves all 4 extremities w/o difficulty. Affect pleasant  Symptomatically improved from HF perspective. Plan R/L cath today. We discussed procedure. Ok to proceed.   Await serologies to explain underlying pathology. Pending results of cath can consider cardiac MRI.   Glori Bickers, MD  2:45 PM

## 2021-04-09 NOTE — Interval H&P Note (Signed)
History and Physical Interval Note:  04/09/2021 2:42 PM  Connor Solomon  has presented today for surgery, with the diagnosis of heart failure.  The various methods of treatment have been discussed with the patient and family. After consideration of risks, benefits and other options for treatment, the patient has consented to  Procedure(s): RIGHT/LEFT HEART CATH AND CORONARY ANGIOGRAPHY (N/A) and possible coronary angioplasty as a surgical intervention.  The patient's history has been reviewed, patient examined, no change in status, stable for surgery.  I have reviewed the patient's chart and labs.  Questions were answered to the patient's satisfaction.     Cristin Penaflor

## 2021-04-09 NOTE — Progress Notes (Addendum)
Patient Summary: Connor Solomon is a 60 year old make with a pertinent medical history of HTN and arthritis here for new onset HFrEF (EF 20-25%) of unknown etiology and unintentional weight loss of 25 lbs. in the setting of elevated inflammatory markers who is being worked up for ischemia, autoimmune disease and malignancy.  Subjective: No acute events overnight. He is s/p IV lasix 40 mg x2 and states that his breathing is much improved from admission. HE is able to lay flat comfortably now. He is still endorsing fatigue and intermittent chest tightness. He shares that about a year ago he had a pruritic rash erupt while he was at the beach that was diagnosed as Scabies. He continues to endorse pruritus and has diffuse lesions despite completing treatment months ago. He is looking forward to going for RHC/LHC procedure today.  Objective:  Vital signs in last 24 hours: Vitals:   04/08/21 1945 04/09/21 0000 04/09/21 0346 04/09/21 0409  BP: 106/79 108/80 100/78   Pulse: 99 92 98   Resp: 20     Temp: 98 F (36.7 C) 98.1 F (36.7 C) 98.1 F (36.7 C)   TempSrc: Oral Oral Oral   SpO2: 98% 97% 100%   Weight:    67.9 kg  Height:       Weight change: -1.954 kg  Intake/Output Summary (Last 24 hours) at 04/09/2021 1017 Last data filed at 04/09/2021 0600 Gross per 24 hour  Intake 717 ml  Output 1775 ml  Net -1058 ml    Constitutional: Fatigued-appearing, elderly male in no acute distress HENT:atraumatic and normocephalic Eyes: no scleral icterus or conjunctival erythema Cardiovascular: NSR, regular rate, no extra heart sounds Pulmonary/Chest: Upper and lower lung fields CTAB, improved from admission Abdominal: abdomen soft and non-tender, non-distended MSK: no gross deficits Neurological: alert and oriented x3 Skin: diffuse pinpoint erosions and tan papules present diffusely on BLEs Psych: affect mood congruent  Assessment/Plan:  Principal Problem:   Acute HFrEF (heart failure  with reduced ejection fraction) (HCC) Active Problems:   Benign essential hypertension   Weight loss, unintentional   Microcytic anemia   Acute heart failure (HCC)   Mitral regurgitation  Acute HFrEF (EF 20-25%); New diagnosis Dyspnea and orthopnea is continuing to improve. He is net negative ~ 1.05 L s/p IV Lasix 40 mg x2. Etiology continues to remain unclear at this time however differential includes ischemia, autoimmune (Stills disease), and infiltrative, although less likely. Plan is for RHC/LHC procedure today for ischemic evaluation. Will hold diuresis for now as it is difficult to say whether diuresis is efficacious as his weight did not change much in the last 24 hrs. (68->67.9). Will also continue to optimize GDMT as tolerable from a blood pressure and HR standpoint. Cardiology is following. - RHC/LHC today; consider cMRI if unremarkable - Losartan 25 mg daily - Spiro 12.5 mg daily - Follow up HIV antigen - Optimize GDMT as tolerable and affordable - Daily BMP and Mg - Daily weights - Strict I/Os - Tele  Unintentional Weight loss; Current Inflammatory State Approximately 25 pound weight loss, B symptoms, morning stiffness, arthralgias, and rash for the last 6-12 months in the setting of elevated CRP, ESR, and C3 with normal total complement. CT AP unremarkable except for retroperitoneal and inguinal lymphadenopathy. Of note WBC is elevated to 11.1 today in the absence of fever. Differential includes autoimmune pathology vs. malignancy, although the latter is less likely given low LDH and unremarkable peripheral blood smear. Potential autoimmune/rheumatic conditions being considered  include vasculitides, lupus, and adult onset Still's disease. Patient underwent punch biopsy of right lower extremity papule for assessment of vasculitic pathology. Will continue to follow-up on autoimmune labs. - follow up autoimmune labs:  - anti-CCP, RF  - Anti-dsDNA, ENA, ANA - Checking serum  immunofixation and free light chains - follow up CEA - follow up punch biopsy pathology  Anemia of Inflammation Hemoglobin remains stable at 9.5 with an MCV of 75.4 in the setting of elevated inflammatory markers and normal ferritin. Reticulocyte count 1.8 with elevated reticulocyte fraction of 20.9%. Blood smear shows leukocytosis and thrombocytosis with normal morphology. Considering autoimmune etiology (adult onset still's disease vs. Castleman's disease vs. Lupus vs. RF). - Peripheral Blood smear - daily CBC  Diet: Sodium restricted DVT Ppx: SubQ heparin Fluids: PRN Code Status: Full    LOS: 1 day   Ok Edwards, Medical Student 04/09/2021, 6:46 AM

## 2021-04-10 ENCOUNTER — Inpatient Hospital Stay (HOSPITAL_COMMUNITY): Payer: Medicaid Other

## 2021-04-10 ENCOUNTER — Encounter (HOSPITAL_COMMUNITY): Payer: Self-pay | Admitting: Internal Medicine

## 2021-04-10 DIAGNOSIS — I429 Cardiomyopathy, unspecified: Secondary | ICD-10-CM

## 2021-04-10 LAB — BASIC METABOLIC PANEL
Anion gap: 10 (ref 5–15)
BUN: 16 mg/dL (ref 6–20)
CO2: 24 mmol/L (ref 22–32)
Calcium: 8.9 mg/dL (ref 8.9–10.3)
Chloride: 100 mmol/L (ref 98–111)
Creatinine, Ser: 0.99 mg/dL (ref 0.61–1.24)
GFR, Estimated: >60 mL/min (ref >60)
Glucose, Bld: 123 mg/dL — ABNORMAL HIGH (ref 70–99)
Potassium: 4.3 mmol/L (ref 3.5–5.1)
Sodium: 134 mmol/L — ABNORMAL LOW (ref 135–145)

## 2021-04-10 LAB — RHEUMATOID FACTOR: Rheumatoid fact SerPl-aCnc: 15.3 IU/mL — ABNORMAL HIGH (ref <14.0)

## 2021-04-10 LAB — CYCLIC CITRUL PEPTIDE ANTIBODY, IGG/IGA: CCP Antibodies IgG/IgA: 3 units (ref 0–19)

## 2021-04-10 LAB — CEA: CEA: 1.5 ng/mL (ref 0.0–4.7)

## 2021-04-10 MED ORDER — LOSARTAN POTASSIUM 25 MG PO TABS: 25.0000 mg | ORAL_TABLET | Freq: Every day | ORAL | 0 refills | Status: DC

## 2021-04-10 MED ORDER — SPIRONOLACTONE 25 MG PO TABS
25.0000 mg | ORAL_TABLET | Freq: Every day | ORAL | 0 refills | Status: DC
Start: 2021-04-11 — End: 2021-04-20

## 2021-04-10 MED ORDER — CARVEDILOL 3.125 MG PO TABS: 3.1250 mg | ORAL_TABLET | Freq: Two times a day (BID) | ORAL | Status: DC

## 2021-04-10 MED ORDER — GADOBUTROL 1 MMOL/ML IV SOLN
10.0000 mL | Freq: Once | INTRAVENOUS | Status: AC | PRN
  Administered 2021-04-10: 10 mL via INTRAVENOUS

## 2021-04-10 MED ORDER — SPIRONOLACTONE 25 MG PO TABS: 25.0000 mg | ORAL_TABLET | Freq: Every day | ORAL | Status: DC

## 2021-04-10 MED ORDER — CARVEDILOL 3.125 MG PO TABS: 3.1250 mg | ORAL_TABLET | Freq: Two times a day (BID) | ORAL | 0 refills | Status: DC

## 2021-04-10 MED ORDER — FUROSEMIDE 20 MG PO TABS: ORAL_TABLET | ORAL | 0 refills | Status: DC

## 2021-04-10 NOTE — Progress Notes (Signed)
Discharge instructions given. Pt verbalized understanding and all questions were answered.  

## 2021-04-10 NOTE — Discharge Summary (Addendum)
Name: Connor Solomon MRN: [id number] DOB: 02/21/1961 60 y.o. PCP: [name], NP  Date of Admission: 04/07/2021  9:26 AM Date of Discharge: 04/10/2021 Attending Physician: Dr. Evette Doffing  Discharge Diagnosis: Principal Problem:   Acute HFrEF (heart failure with reduced ejection fraction) ([place]) Active Problems:   IgM Kappa MGUS     Benign essential hypertension   Weight loss, unintentional   Microcytic anemia   Acute heart failure (HCC)   Mitral regurgitation    Discharge Medications: Allergies as of 04/10/2021       Reactions   Hydrocodone Itching, Nausea And Vomiting        Medication List     TAKE these medications    albuterol 108 (90 Base) MCG/ACT inhaler Commonly known as: VENTOLIN HFA Inhale 2 puffs into the lungs every 6 (six) hours as needed for wheezing or shortness of breath.   carvedilol 3.125 MG tablet Commonly known as: COREG Take 1 tablet (3.125 mg total) by mouth 2 (two) times daily with a meal.   ferrous sulfate 300 (60 Fe) MG/5ML syrup Take 5 mLs (300 mg total) by mouth daily.   furosemide 20 MG tablet Commonly known as: Lasix 20 mg tablet daily for increase in 3 pounds over 24 hours   losartan 25 MG tablet Commonly known as: COZAAR Take 1 tablet (25 mg total) by mouth daily. Start taking on: April 11, 2021   spironolactone 25 MG tablet Commonly known as: ALDACTONE Take 1 tablet (25 mg total) by mouth daily. Start taking on: April 11, 2021        Disposition and follow-up:   Connor Solomon was discharged from [facility] in Good condition.  At the hospital follow up visit please address:  1.  Follow-up:  a. Optimization of GDMT for new diagnosis of HFrEF    b. Follow-up pathology results of Skin Punch Biopsy   c. Follow up cardiac MRI results  d. Work up for [employer] to include light chains, serum viscosity, and heme onc referral     2.  Labs / imaging needed at time of follow-up: Check  serum free kappa and lambda light chains and serum viscosity.   3.  Pending labs/ test needing follow-up:  RPR  Skin surgical biopsy pathology Multiple Myeloma Panel (SPEP + IFE w/ QIG) Urine Immunofixation Cardiac MRI Consider PET Scan  4.  Medication Changes  Started: Losartan 25 mg Daily     Spironolactone 25 mg Daily                Coreg 3.125 mg BID      Lasix 20 mg Oral if you gain 3 or more pounds in a 24 hour period     Stopped:none  Changed:none  Follow-up Appointments:  Follow-up Information     [facility] Follow up on 04/17/2021.   Specialty: Cardiology Why: at 3:00 . 1st floor at [facility] in the [place]. Entrance C . Contact information: [address] [id number] [place]        [name], NP. Go on 05/11/2021.   Specialty: Internal Medicine Why: Hospital follow up soonest available 05/11/2021 at 1:30pm Contact information: [address] [place] [address] [place] Hospital Course by problem list: Patient Summary:  Connor Solomon is a 60 year old male  with a past medical history of  hypertension and arthritis here with a new diagnosis of Acute HFrEF exacerbation (EF 20-25%) as well as an acute inflammatory state.  Acute HFrEF (EF 20-25%), Non-ischemic; New Diagnosis of Unknown Etiology Connor Solomon initially presented with worsening dyspnea, chest tightness, and orthopnea.He was found to have a right lower lobe opacity on chest x-ray and bilateral pleural effusions on CT Angiogram. BNP was elevated to 380.4, EKG showed isolated ST elevation in V1 and sinus tachycardia with flat troponin levels. Formal echo was obtained which showed a reduced LVEF of 20-25%, global hypokinesis, and grade 3 diastolic dysfunction consistent with a diagnosis of HFrEF. He was diuresed with IV Lasix 40 mg x3 which significantly  improved his breathing symptoms. Cardiology was consulted and they recommended obtaining a left and right heart cath procedure which showed low filling pressures and no ischemia. Non-ischemic cardiomyopathy including TSH, Ferritin and HIV was negative. He underwent cMRI on day of discharge for diagnostic clarification. He was started on GDMT with Losartan, Spironolactone, and B-blocker as was tolerable hemodynamically. Discharge medication regimen is as follows: Coreg 3.125 mg BID Losartan 25 mg Daily Spironolactone 25 mg Daily Lasix 20 mg PRN ( if 3 or more pound weight gain in 24 hour period)  IgM Monoclonal Gammopathy; Unintentional Weight Loss; Acute Inflammatory State Presented with a 25 pound unintentional weight loss, fevers, night sweats, joint pain, and morning stiffness over the last year. Differential included malignancy and autoimmune disease. CTAP was unremarkable except for retroperitoneal and inguinal lymphadenopathy. Physical exam revealed a diffuse, papular, puritic rash on his extremities of which a punch biopsy sample was sent to pathology to assess for vasculitis. Malignancy markers CEA and LDH were non-elevated. Inflammatory markers ESR and CRP were elevated. Additional autoimmune lab work was obtained which were non-specific. Spoke to radiology to determine which lymph nodes would be amenable for biopsy and they suggested PET Scan prior to biopsy. After discharge, serum elecrophoresis showed an IgM monoclonal antibody, which may be consistent with either IgM Kappa MGUS or possibly Waldenstrom Macroglobulinemia given lymphadenopathy. This latter condition is associated with hyperviscosity which may explain the itchy rash for the last year. It rarely is associated with cardiomyopathy either as an effect of chronic hyperviscosity or if there is a lambda light chain amyloidosis. Only other CRAB criteria is the anemia, but no hypercalcemia or bone lesions to suggest multiple myeloma. This  will need referral to hematology to consider bone marrow biopsy and to clarify the diagnosis.   Anemia of Inflammation Patient had a Hgb of 9.0 and MCV of 78.2 and thrombocytosis, which was stable from prior. Ferritin was normal and iron studies showed low iron levels with normal TIBC. Peripheral blood smear was ordered due to concern for malignancy and was unremarkable. Reticulocyte count was 1.8% with an index of 1.3. Thought to be secondary to inflammation given elevated inflammatory markers and normal ferritin.    Discharge Subjective: There were no acute events overnight. Today Connor Solomon states that his breathing is much improved from admission. He is eager to go home. He is glad that the social worker will be able to help him with a plan for his disability paperwork as he feels that he cannot keep up with his job anymore due to his breathing and fatigue. He endorses some soreness and burning at the surgical biopsy site.  Discharge Exam:   BP 117/70 (BP Location: Right Arm)   Pulse 90   Temp 97.8 F (36.6 C) (Oral)  Resp 18   Ht _0  (1.727 m)   Wt 67.9 kg   SpO2 99%   BMI 22.78 kg/m  Constitutional: well-appearing male sitting in bed, in no acute distress HENT: normocephalic atraumatic Eyes: conjunctiva non-erythematous Cardiovascular: regular rate and rhythm, no m/r/g Pulmonary/Chest: normal work of breathing on room air, upper and lower lungs clear to auscultation bilaterally MSK: no gross deficits Neurological: alert & oriented x 3, no focal deficits Extremities: no pitting edema Skin: excoriations and papules present in lower extremities bilaterally, Punch biopsy site with coagulated blood, no purulent discharge. Psych: affect mood congruent   Pertinent Labs, Studies, and Procedures:  CBC Latest Ref Rng & Units 04/09/2021 04/09/2021 04/09/2021  WBC 4.0 - 10.5 K/uL - - -  Hemoglobin 13.0 - 17.0 g/dL 9.9(L) 9.2(L) 8.8(L)  Hematocrit 39.0 - 52.0 % 29.0(L) 27.0(L) 26.0(L)   Platelets 150 - 400 K/uL - - -    CMP Latest Ref Rng & Units 04/10/2021 04/09/2021 04/09/2021  Glucose 70 - 99 mg/dL 123(H) - -  BUN 6 - 20 mg/dL 16 - -  Creatinine 0.61 - 1.24 mg/dL 0.99 - -  Sodium 135 - 145 mmol/L 134(L) 135 138  Potassium 3.5 - 5.1 mmol/L 4.3 4.2 4.0  Chloride 98 - 111 mmol/L 100 - -  CO2 22 - 32 mmol/L 24 - -  Calcium 8.9 - 10.3 mg/dL 8.9 - -  Total Protein 6.5 - 8.1 g/dL - - -  Total Bilirubin 0.3 - 1.2 mg/dL - - -  Alkaline Phos 38 - 126 U/L - - -  AST 15 - 41 U/L - - -  ALT 0 - 44 U/L - - -    CT Angio Chest PE W and/or Wo Contrast  Result Date: 04/07/2021 CLINICAL DATA:  Increasing shortness of breath EXAM: CT ANGIOGRAPHY CHEST WITH CONTRAST TECHNIQUE: Multidetector CT imaging of the chest was performed using the standard protocol during bolus administration of intravenous contrast. Multiplanar CT image reconstructions and MIPs were obtained to evaluate the vascular anatomy. CONTRAST:  23m OMNIPAQUE IOHEXOL 350 MG/ML SOLN COMPARISON:  Film from earlier in the same day. FINDINGS: Cardiovascular: Thoracic aorta and its branches demonstrate atherosclerotic calcifications without aneurysmal dilatation. Heart is not significantly enlarged in size. Pulmonary artery shows a normal branching pattern. No discrete filling defect is identified to suggest pulmonary embolism. Mediastinum/Nodes: Thoracic inlet is within normal limits. Scattered small hilar and mediastinal lymph nodes are noted likely reactive in nature. The esophagus as visualized is within normal limits. Lungs/Pleura: Bilateral moderate-sized effusions are noted. Patchy ground-glass opacities are noted likely related to acute multifocal infiltrate. No sizable parenchymal nodule is seen. Upper Abdomen: Within normal limits. Musculoskeletal: Degenerative changes of the thoracic spine are noted. Review of the MIP images confirms the above findings. IMPRESSION: No evidence of pulmonary emboli. Bilateral pleural effusions  of moderate size. Patchy airspace opacities consistent with multifocal pneumonia. Aortic Atherosclerosis (ICD10-I70.0). Electronically Signed   By: MInez CatalinaM.D.   On: 04/07/2021 14:24   CT ABDOMEN PELVIS W CONTRAST  Result Date: 04/07/2021 CLINICAL DATA:  Unintended weight loss, iron deficiency anemia EXAM: CT ABDOMEN AND PELVIS WITH CONTRAST TECHNIQUE: Multidetector CT imaging of the abdomen and pelvis was performed using the standard protocol following bolus administration of intravenous contrast. CONTRAST:  1066mOMNIPAQUE IOHEXOL 300 MG/ML  SOLN COMPARISON:  05/31/2020 FINDINGS: Lower chest: Small to moderate bilateral pleural effusions. Dependent atelectasis in the lower lobes. Heart is normal size. Hepatobiliary: No focal hepatic abnormality. Gallbladder unremarkable. Pancreas:  No focal abnormality or ductal dilatation. Spleen: No focal abnormality.  Normal size. Adrenals/Urinary Tract: No adrenal abnormality. No focal renal abnormality. No stones or hydronephrosis. Urinary bladder is unremarkable. Stomach/Bowel: Stomach, large and small bowel grossly unremarkable. Normal appendix. Vascular/Lymphatic: Mildly prominent retroperitoneal lymph nodes. Left periaortic node has a short axis diameter of 13 mm. These are similar to prior study. Mildly enlarged inguinal lymph nodes bilaterally, left greater than right. Left inguinal index node has a short axis diameter of 10 mm. Reproductive: No visible focal abnormality. Other: No free fluid or free air. Musculoskeletal: No acute bony abnormality. IMPRESSION: Small to moderate bilateral pleural effusions. Dependent atelectasis in the lower lobes. Mildly enlarged retroperitoneal lymph nodes and bilateral inguinal lymph nodes, not significantly changed since prior study. No acute findings in the abdomen or pelvis. Electronically Signed   By: Rolm Baptise M.D.   On: 04/07/2021 23:47   DG Chest Port 1 View  Result Date: 04/07/2021 CLINICAL DATA:  60 year old  male with shortness of breath on exertion for 2-3 days. Dry cough. Former smoker. EXAM: PORTABLE CHEST 1 VIEW COMPARISON:  Chest radiographs 02/12/2014 and earlier. FINDINGS: Portable AP semi upright view at 1013 hours. Cardiac size at the upper limits of normal. Other mediastinal contours are within normal limits. Asymmetric patchy and indistinct opacity at the medial right lung base. Elsewhere lung markings appear within normal limits. A degree of pulmonary hyperinflation is suspected. No pneumothorax or pleural effusion. Visualized tracheal air column is within normal limits. Chronic appearing proximal right humerus deformity, was acute in August last year. Chronic right lateral 6th rib deformity. No acute osseous abnormality identified. IMPRESSION: 1. Patchy and indistinct right lung base opacity is nonspecific, but suspicious for bronchopneumonia in this clinical setting. No pleural effusion. If there are signs/symptoms of infection then followup PA and lateral chest X-ray is recommended in 3-4 weeks following trial of antibiotic therapy to ensure resolution and exclude underlying malignancy. Otherwise recommend Chest CT (IV contrast preferred) to further characterize. 2. Evolution of proximal right humerus fracture since August. Electronically Signed   By: Genevie Ann M.D.   On: 04/07/2021 10:34   ECHOCARDIOGRAM COMPLETE  Result Date: 04/07/2021    ECHOCARDIOGRAM REPORT   Patient Name:   TYREKE KAESER Date of Exam: 04/07/2021 Medical Rec #:  [id number]     Height:       68.0 in Accession #:    [id number]    Weight:       154.0 lb Date of Birth:  [birth year]     BSA:          1.829 m Patient Age:    30 years      BP:           114/71 mmHg Patient Gender: M             HR:           99 bpm. Exam Location:  Inpatient Procedure: 2D Echo Indications:    dyspnea  History:        Patient has prior history of Echocardiogram examinations, most                 recent 09/29/2009. Signs/Symptoms:leg swelling.  Sonographer:     Johny Chess Referring Phys: Murphy  1. Left ventricular ejection fraction, by estimation, is 20 to 25%. The left ventricle has severely decreased function. The left ventricle demonstrates global hypokinesis. The left ventricular internal cavity size was moderately dilated. Left ventricular  diastolic parameters are consistent with Grade III diastolic dysfunction (restrictive). Elevated left atrial pressure.  2. Right ventricular systolic function is normal. The right ventricular size is normal. Tricuspid regurgitation signal is inadequate for assessing PA pressure.  3. Left atrial size was severely dilated.  4. The mitral valve is normal in structure. Moderate to severe mitral valve regurgitation.  5. The aortic valve is tricuspid. Aortic valve regurgitation is trivial. No aortic stenosis is present.  6. The inferior vena cava is dilated in size with >50% respiratory variability, suggesting right atrial pressure of 8 mmHg. Comparison(s): Prior images unable to be directly viewed, comparison made by report only. FINDINGS  Left Ventricle: Left ventricular ejection fraction, by estimation, is 20 to 25%. The left ventricle has severely decreased function. The left ventricle demonstrates global hypokinesis. The left ventricular internal cavity size was moderately dilated. There is no left ventricular hypertrophy. Left ventricular diastolic parameters are consistent with Grade III diastolic dysfunction (restrictive). Elevated left atrial pressure. Right Ventricle: The right ventricular size is normal. No increase in right ventricular wall thickness. Right ventricular systolic function is normal. Tricuspid regurgitation signal is inadequate for assessing PA pressure. Left Atrium: Left atrial size was severely dilated. Right Atrium: Right atrial size was normal in size. Pericardium: There is no evidence of pericardial effusion. Mitral Valve: The mitral valve is normal in structure. Moderate to  severe mitral valve regurgitation, with centrally-directed jet. Tricuspid Valve: The tricuspid valve is normal in structure. Tricuspid valve regurgitation is not demonstrated. Aortic Valve: The aortic valve is tricuspid. Aortic valve regurgitation is trivial. No aortic stenosis is present. Pulmonic Valve: The pulmonic valve was grossly normal. Pulmonic valve regurgitation is not visualized. Aorta: The aortic root and ascending aorta are structurally normal, with no evidence of dilitation. Venous: The inferior vena cava is dilated in size with greater than 50% respiratory variability, suggesting right atrial pressure of 8 mmHg. IAS/Shunts: There is right bowing of the interatrial septum, suggestive of elevated left atrial pressure. No atrial level shunt detected by color flow Doppler.  LEFT VENTRICLE PLAX 2D LVIDd:         6.30 cm      Diastology LVIDs:         5.60 cm      LV e' medial:    8.70 cm/s LV PW:         1.10 cm      LV E/e' medial:  12.9 LV IVS:        1.00 cm      LV e' lateral:   5.55 cm/s LVOT diam:     2.10 cm      LV E/e' lateral: 20.2 LV SV:         47 LV SV Index:   26 LVOT Area:     3.46 cm  LV Volumes (MOD) LV vol d, MOD A2C: 180.0 ml LV vol d, MOD A4C: 205.0 ml LV vol s, MOD A2C: 129.0 ml LV vol s, MOD A4C: 145.0 ml LV SV MOD A2C:     51.0 ml LV SV MOD A4C:     205.0 ml LV SV MOD BP:      58.2 ml RIGHT VENTRICLE             IVC RV S prime:     12.60 cm/s  IVC diam: 2.10 cm TAPSE (M-mode): 1.4 cm LEFT ATRIUM              Index  RIGHT ATRIUM           Index LA diam:        4.60 cm  2.52 cm/m  RA Area:     14.80 cm LA Vol (A2C):   124.0 ml 67.80 ml/m RA Volume:   36.60 ml  20.01 ml/m LA Vol (A4C):   85.9 ml  46.97 ml/m LA Biplane Vol: 103.0 ml 56.32 ml/m  AORTIC VALVE LVOT Vmax:   84.40 cm/s LVOT Vmean:  56.100 cm/s LVOT VTI:    0.136 m  AORTA Ao Root diam: 3.00 cm Ao Asc diam:  3.50 cm MITRAL VALVE MV Area (PHT): 5.31 cm      SHUNTS MV Decel Time: 143 msec      Systemic VTI:  0.14 m  MR Peak grad:    108.6 mmHg  Systemic Diam: 2.10 cm MR Mean grad:    67.0 mmHg MR Vmax:         521.00 cm/s MR Vmean:        383.0 cm/s MR PISA:         1.01 cm MR PISA Eff ROA: 8 mm MR PISA Radius:  0.40 cm MV E velocity: 112.00 cm/s MV A velocity: 82.30 cm/s MV E/A ratio:  1.36 Mihai Croitoru MD Electronically signed by Sanda Klein MD Signature Date/Time: 04/07/2021/6:34:38 PM    Final      Discharge Instructions:  Connor Solomon, you were admitted to the hospital because you were having worsening shortness of breath because of fluid backing up into your lungs. This fluid was backing up because your heart is not pumping at its healthiest capacity. This condition is known as Heart Failure with reduced ejection fraction which was diagnosed based on an echocardiogram study that was obtained during your hospital admission.   You underwent a left heart cath procedure to see if blockages to blood flow to your heart muscle was causing the heart failure, however you were found to have no blockages.   We feel that your weight loss, fevers, chills, rash and heart problems may all be related and a result of an autoimmune condition. The skin biopsy of the rash may help [place] figure out the answer as your autoimmune lab work done during your hospital admission was not informative.  You are being discharged with the following medications to manage your heart failure:   Losartan 25 mg once daily Coreg 3.125 mg twice daily Spironolactone 25 mg daily Lasix 20 mg Oral if you gain 3 or more pounds in a 24 hour period  Please take these medications as prescribed and follow-up with your cardiologist as mentioned above to review the results of your cardiac MRI and medications.  You will also follow up with the internal medicine doctor as mentioned above to discuss as early as they are available to discuss the biopsy results and your general well being after discharge from the hospital.  Please call 911 or go to your  nearest emergency department if you develop chest pain, difficulty breathing, or feeling that you are dizzy or about to pass out as these may be signs that your heart is under stress.   Emmit Alexanders! Discharge Instructions     (HEART FAILURE PATIENTS) Call MD:  Anytime you have any of the following symptoms: 1) 3 pound weight gain in 24 hours or 5 pounds in 1 week 2) shortness of breath, with or without a dry hacking cough 3) swelling in the hands, feet or stomach 4) if you  have to sleep on extra pillows at night in order to breathe.   Complete by: As directed    Call MD for:  difficulty breathing, headache or visual disturbances   Complete by: As directed    Call MD for:  persistant dizziness or light-headedness   Complete by: As directed    Call MD for:  persistant nausea and vomiting   Complete by: As directed    Diet - low sodium heart healthy   Complete by: As directed    Increase activity slowly   Complete by: As directed        Signed: Ok Edwards, Medical Student 04/10/2021, 4:00 PM   Pager: [phone]   Attestation for Student Documentation:  I personally was present and performed or re-performed the history, physical exam and medical decision-making activities of this service and have verified that the service and findings are accurately documented in the student's note. I have made needed edits to this note.  Axel Filler, MD 04/13/2021, 12:38 PM

## 2021-04-10 NOTE — Discharge Instructions (Addendum)
[name], you were admitted to the hospital because you were having worsening shortness of breath and found to have Heart Failure with a reduced ejection fraction which was diagnosed based on an echocardiogram study that was obtained during your hospital admission.   You underwent a left heart cath procedure to see if blockages to blood flow to your heart muscle was causing the heart failure, however you were found to have no blockages.   We feel that your weight loss, fevers, chills, rash and heart problems may all be related and a result of an autoimmune condition. The skin biopsy of the rash may help [place] figure out the answer as your autoimmune lab work done during your hospital admission was not informative.   You are being discharged with the following medications to manage your heart failure:   Losartan 25 mg once daily Coreg 3.125 mg twice daily Spironolactone 25 mg daily Lasix 20 mg daily as needed for weight gain of 3 pounds in a 24 hour period   Please take these medications as prescribed and follow-up with your cardiologist as mentioned above to review the results of your cardiac MRI and medications.   You will also follow up with the internal medicine doctor as mentioned above to discuss as early as they are available to discuss the biopsy results and your general well being after discharge from the hospital.   Please call 911 or go to your nearest emergency department if you develop chest pain, difficulty breathing, or feeling that you are dizzy or about to pass out as these may be signs that your heart is under stress.

## 2021-04-10 NOTE — TOC Initial Note (Addendum)
Transition of Care (TOC) - Initial/Assessment Note  Heart Failure   Patient Details  Name: Connor Solomon MRN: [id number] Date of Birth: 04-Feb-1961  Transition of Care [facility]) CM/SW Contact:    [place], [place] Phone Number: 04/10/2021, 12:18 PM  Clinical Narrative:      CSW spoke with the patient at bedside and completed a very brief SDOH screening with the patient who reported that he is still driving, he does have a primary care doctor, Juluis Mire, NP but he would like to switch to another provider if possible. Mr. Ferrara asked about help with applying for disability and CSW obtained the patients signature for the disability and Food Stamp referral for the [facility] to reach out to them as the patient reported any help would be beneficial. Mr. Skorupski reported that he is behind on his power bill and his mortgage and he is concerned about possible eviction if he doesn't pay his bills. CSW informed Mr. Stofko about following up with the heart and vascular center upon discharge and following up with the social worker and team regarding his bills. CSW also provided Mr. Joung with a CAFA application as he has no health insurance and reached out to [facility] [employer] about screening him for Medicaid or following up with the screen completed on 04/07/21. CSW will make sure he has a follow up appointment with the [facility] before discharge. CSW reached out to [place] per request of Mr. Viets to inquire about getting a new primary care provider and they reported that the patient would need to come to the office to fill out paperwork and then he would be put on a waitlist before being scheduled.   CSW will continue to follow throughout discharge.   Expected Discharge Plan: Home/Self Care Barriers to Discharge: Continued Medical Work up   Patient Goals and CMS Choice        Expected Discharge Plan and Services Expected Discharge Plan: Home/Self Care In-house Referral: Clinical  Social Work     Living arrangements for the past 2 months: Single Family Home                                      Prior Living Arrangements/Services Living arrangements for the past 2 months: Single Family Home Lives with:: Spouse, Self Patient language and need for interpreter reviewed:: Yes Do you feel safe going back to the place where you live?: Yes      Need for Family Participation in Patient Care: No (Comment) Care giver support system in place?: No (comment)   Criminal Activity/Legal Involvement Pertinent to Current Situation/Hospitalization: No - Comment as needed  Activities of Daily Living      Permission Sought/Granted                  Emotional Assessment Appearance:: Appears stated age Attitude/Demeanor/Rapport: Engaged Affect (typically observed): Pleasant Orientation: : Oriented to Place, Oriented to Self, Oriented to  Time, Oriented to Situation   Psych Involvement: No (comment)  Admission diagnosis:  Acute heart failure ([place]) [I50.9] Dyspnea on exertion [R06.00] Elevated troponin [R77.8] Multifocal pneumonia [J18.9] Anemia, unspecified type [D64.9] Patient Active Problem List   Diagnosis Date Noted   Weight loss, unintentional 04/08/2021   Microcytic anemia 04/08/2021   Acute heart failure ([place]) 04/08/2021   Mitral regurgitation 04/08/2021   Acute HFrEF (heart failure with reduced ejection fraction) ([place]) 04/07/2021  Benign essential hypertension 09/22/2009   PCP:  [name], NP Pharmacy:   [id number] [place], [place] [place] [address] Phone: [phone] Fax: [phone]  [facility] [place] [address] Phone: [phone] Fax: [phone]     Social Determinants of Health (SDOH) Interventions Food Insecurity Interventions: Assist with SNAP Application Financial Strain  Interventions: Development worker, community Housing Interventions: Other (Comment) (Refer to HF outpatient clinic to follow up with social worker) Transportation Interventions: Intervention Not Indicated  Readmission Risk Interventions No flowsheet data found.  Aidden Markovic, MSW, [place] Heart Failure Social Worker

## 2021-04-10 NOTE — Progress Notes (Addendum)
Advanced Heart Failure Rounding Note  PCP-Cardiologist: Dr. Gala Romney    Patient Profile   60 y/o male with recent unintentional weight loss admitted with acute HF. Echo EF 20-25% . RV normal.  ECG with LVH otherwise unremarkable. Hstrop negative. Has IDA and ESR 142 (!) CT C/A/P unremarkable. 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    Subjective:     04/09/21 RHC/LHC--> normal coronaries, severe NICM 25%, low filling pressures, and normal cardiac output.    Feels ok. No shortness of breath. Wants to know when he can go home.    Objective:   Weight Range: 67.9 kg Body mass index is 22.78 kg/m.   Vital Signs:   Temp:  [97.8 F (36.6 C)-98.1 F (36.7 C)] 97.8 F (36.6 C) (07/01 0813) Pulse Rate:  [72-99] 90 (07/01 0813) Resp:  [5-20] 18 (07/01 0813) BP: (94-129)/(62-77) 117/70 (07/01 0813) SpO2:  [96 %-100 %] 99 % (07/01 0813) Weight:  [67.9 kg] 67.9 kg (07/01 0331) Last BM Date: 04/08/21  Weight change: Filed Weights   04/08/21 0513 04/09/21 0409 04/10/21 0331  Weight: 68 kg 67.9 kg 67.9 kg    Intake/Output:   Intake/Output Summary (Last 24 hours) at 04/10/2021 1157 Last data filed at 04/10/2021 0336 Gross per 24 hour  Intake 567.47 ml  Output 150 ml  Net 417.47 ml      Physical Exam   General:  Sitting on the side of the bed. No resp difficulty HEENT: normal Neck: supple. no JVD. Carotids 2+ bilat; no bruits. No lymphadenopathy or thryomegaly appreciated. Cor: PMI nondisplaced. Regular rate & rhythm. No rubs, gallops or murmurs. Lungs: clear Abdomen: soft, nontender, nondistended. No hepatosplenomegaly. No bruits or masses. Good bowel sounds. Extremities: no cyanosis, clubbing, rash, edema Neuro: alert & orientedx3, cranial nerves grossly intact. moves all 4 extremities w/o difficulty. Affect pleasant  Telemetry  SR 80-90s    EKG    No new EKG to review   Labs    CBC Recent Labs    04/08/21 0410 04/09/21 0249 04/09/21 1516  04/09/21 1520 04/09/21 1521  WBC 9.7 11.1*  --   --   --   NEUTROABS 6.6 7.8*  --   --   --   HGB 9.2* 9.5*   < > 9.2* 9.9*  HCT 29.0* 30.0*   < > 27.0* 29.0*  MCV 75.9* 75.4*  --   --   --   PLT 451* 525*  --   --   --    < > = values in this interval not displayed.   Basic Metabolic Panel Recent Labs    81/66/19 0410 04/09/21 0200 04/09/21 1516 04/09/21 1521 04/10/21 0354  NA 134* 132*   < > 135 134*  K 4.0 3.7   < > 4.2 4.3  CL 101 97*  --   --  100  CO2 23 29  --   --  24  GLUCOSE 111* 108*  --   --  123*  BUN 12 18  --   --  16  CREATININE 0.88 1.10  --   --  0.99  CALCIUM 8.8* 8.6*  --   --  8.9  MG 2.0 2.0  --   --   --    < > = values in this interval not displayed.   Liver Function Tests Recent Labs    04/08/21 0410  AST 13*  ALT 12  ALKPHOS 72  BILITOT 0.6  PROT 7.1  ALBUMIN 2.5*  No results for input(s): LIPASE, AMYLASE in the last 72 hours. Cardiac Enzymes No results for input(s): CKTOTAL, CKMB, CKMBINDEX, TROPONINI in the last 72 hours.  BNP: BNP (last 3 results) Recent Labs    04/07/21 1013  BNP 380.4*    ProBNP (last 3 results) No results for input(s): PROBNP in the last 8760 hours.   D-Dimer No results for input(s): DDIMER in the last 72 hours.  Hemoglobin A1C Recent Labs    04/08/21 1542  HGBA1C 5.6   Fasting Lipid Panel Recent Labs    04/08/21 0813  CHOL 89  HDL 31*  LDLCALC 49  TRIG 46  CHOLHDL 2.9   Thyroid Function Tests Recent Labs    04/08/21 0410  TSH 2.513    Other results:   Imaging    CARDIAC CATHETERIZATION  Result Date: 04/09/2021 Findings: Ao =98/68 (82) LV = 81/3 RA = 1 RV = 27/2 PA = 25/8 (16) PCW = 10 Fick cardiac output/index = 5.8/3.2 PVR = 1.0 WU Ao sat = 100% PA sat = 66%, 70% Assessment: 1. Normal coronary arteries 2. 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 Low filling pressures with normal cardiac output Plan/Discussion:  Volume status low. Will give back some fluid. Stop lasix. Plan cMRI to further  evaluate cardiomyopathy. Titrate GDMT as tolerated. Glori Bickers, MD 3:42 PM    Medications:     Scheduled Medications:  enoxaparin (LOVENOX) injection  40 mg Subcutaneous Q24H   losartan  25 mg Oral Daily   multivitamin with minerals  1 tablet Oral Daily   ramelteon  8 mg Oral QHS   sodium chloride flush  3 mL Intravenous Q12H   spironolactone  12.5 mg Oral Daily    Infusions:  sodium chloride      PRN Medications: sodium chloride, acetaminophen **OR** acetaminophen, acetaminophen, albuterol, ondansetron (ZOFRAN) IV, polyethylene glycol, sodium chloride flush    Patient Profile   60 y/o male with recent unintentional weight loss admitted with acute HF. Echo EF 20-25% . ECG with LVH otherwise unremarkable. Hstrop negative. Has IDA and ESR 142 (!) CT C/A/P unremarkable. Malignancy  and auto-immune w/u in process.   Assessment/Plan   1. Acute Systolic Heart Failure     - Stress Echo 2010: normal EF. No evidence of ischemia     - Echo 6/22: EF 20-25%, GIIDD (restrictive), no LVH. RV normal     - ? Viral CM (COVID + 2021), ? Familia (FH of SCD). TSH normal, HIV pending      - HS trop trend flat and low level-->6/30 . LHC/RHC with normal cors, low filling pressures, and normal cardiac output      -  Also ? Infiltrative CM. Had CMRI today.       - cMRI if cath unrevealing      - 6/29 multiple myeloma panel and urine immunofixation->pending.       - c/w autoimmune w/u  - Volume status stable. Does not need scheduled diuretics.       - Increase spiro to 25 mg daily.       - continue Losartan 25 daily. Hold off on entresto. Start as an outpatient.       - SGLT2i soon (Hgb A1c 5.6). Start as an outpatient.  - Renal function stable.    2.  Mitral Regurgitation     - mod-severe, likely functional from dilated LV     - repeat limited echo after diuresis   3.  Iron Deficiency Anemia      -  Hgb 8.6>>9.0>9.5 > no CBC today.       - Iron stores low, will plan feraheme post  cMRI      - needs colonoscopy. Unintentional 20 lb wt loss concerning. CEA 1.5         4.  Unintentional Weight Loss      - Primary team pursuing autoimmune and malignancy w/u      - CEA level -->1.5    HF Meds for d/c  Lasix 20 mg daily as needed for 3 pound weight gain in 24 hours Losartan 25 mg daily Spironolactone 25 mg daily Coreg 3.125 mg twice a day.   HF Follow up 7/8 at 3:00  in the HF clinic   CMRI pending.  Length of Stay: 2  Darrick Grinder, NP  04/10/2021, 11:57 AM  Advanced Heart Failure Team Pager [phone] (M-F; 7a - 5p)  Please contact [facility] for night-coverage after hours (5p -7a ) and weekends on [email or web address]  Patient seen and examined with the above-signed Advanced Practice Provider and/or Housestaff. I personally reviewed laboratory data, imaging studies and relevant notes. I independently examined the patient and formulated the important aspects of the plan. I have edited the note to reflect any of my changes or salient points. I have personally discussed the plan with the patient and/or family.  Feels fine. Insistent on wanting to go home. Results of cath reviewed (normal cors). cMRI results pending  General:  Thin male . No resp difficulty HEENT: normal Neck: supple. no JVD. Carotids 2+ bilat; no bruits. No lymphadenopathy or thryomegaly appreciated. Cor: PMI nondisplaced. Regular rate & rhythm. No rubs, gallops or murmurs. Lungs: clear Abdomen: soft, nontender, nondistended. No hepatosplenomegaly. No bruits or masses. Good bowel sounds. Extremities: no cyanosis, clubbing, rash, edema Neuro: alert & orientedx3, cranial nerves grossly intact. moves all 4 extremities w/o difficulty. Affect pleasant  Stable for d/c from a cardiac perspective. Auto-immune/Hematologic/malignancy work-up still ongoing to evaluate ESR 142.  CT C/A/P unremarkable. CEA negative. LDH low. Possibility of Castleman's disease raised. IMTS arranging outpatient w/u.   We will arrange f/u  in HF Clinic   Glori Bickers, MD  4:24 PM

## 2021-04-10 NOTE — TOC Transition Note (Signed)
Transition of Care [facility]) - CM/SW Discharge Note Heart Failure   Patient Details  Name: Connor Solomon MRN: [id number] Date of Birth: [birth year]  Transition of Care [facility]) CM/SW Contact:  [place], [place] Phone Number: 04/10/2021, 3:31 PM   Clinical Narrative:    CSW spoke with patient at bedside to bring them an appointment card for the [facility] outpatient clinic and encouraged them to follow up and to attend the appointment and bring their medications and if anything changes to please reach out so that CSW/HV clinic team can provide support.  CSW encouraged Mr. Diveley to bring the CAFA application back with him to the appointment. CSW scheduled Mr. Klaiber a hospital follow up with his primary care provider Juluis Mire for the soonest available and informed him about [facility] new patient policy and next steps if he wants to change providers.  CSW will sign off for now as social work intervention is no longer needed. Please consult [place] again if new needs arise.   Final next level of care: Home/Self Care Barriers to Discharge: No Barriers Identified   Patient Goals and CMS Choice        Discharge Placement                       Discharge Plan and Services In-house Referral: Clinical Social Work                                   Social Determinants of Health (SDOH) Interventions Food Insecurity Interventions: Assist with [employer] Application Financial Strain Interventions: Development worker, community Housing Interventions: Other (Comment) (Refer to HF outpatient clinic to follow up with social worker) Transportation Interventions: Intervention Not Indicated   Readmission Risk Interventions No flowsheet data found.   Rochelle Larue, MSW, [place] Heart Failure Social Worker

## 2021-04-11 LAB — MULTIPLE MYELOMA PANEL, SERUM
Albumin SerPl Elph-Mcnc: 2.7 g/dL — ABNORMAL LOW (ref 2.9–4.4)
Albumin/Glob SerPl: 0.8 (ref 0.7–1.7)
Alpha 1: 0.4 g/dL (ref 0.0–0.4)
Alpha2 Glob SerPl Elph-Mcnc: 0.9 g/dL (ref 0.4–1.0)
B-Globulin SerPl Elph-Mcnc: 0.9 g/dL (ref 0.7–1.3)
Gamma Glob SerPl Elph-Mcnc: 1.4 g/dL (ref 0.4–1.8)
Globulin, Total: 3.6 g/dL (ref 2.2–3.9)
IgA: 16 mg/dL — ABNORMAL LOW (ref 90–386)
IgG (Immunoglobin G), Serum: 425 mg/dL — ABNORMAL LOW (ref 603–1613)
IgM (Immunoglobulin M), Srm: 1685 mg/dL — ABNORMAL HIGH (ref 20–172)
M Protein SerPl Elph-Mcnc: 1.2 g/dL — ABNORMAL HIGH
Total Protein ELP: 6.3 g/dL (ref 6.0–8.5)

## 2021-04-11 LAB — RPR: RPR Ser Ql: NONREACTIVE

## 2021-04-14 ENCOUNTER — Telehealth: Payer: Self-pay | Admitting: Internal Medicine

## 2021-04-14 ENCOUNTER — Telehealth: Payer: Self-pay

## 2021-04-14 NOTE — Telephone Encounter (Signed)
Transition Care Management Unsuccessful Follow-up Telephone Call  Date of discharge and from where:  04/10/2021, [facility]  Attempts:  1st Attempt  Reason for unsuccessful TCM follow-up call:  Left voice message on # [phone].  Patient has appointment with Juluis Mire, NP at [facility] on 05/11/2021 however, he has an appointment with Internal Medicine 04/20/2021.  Need to confirm if he will be following up at [facility] or is planning to establish are with a new PCP

## 2021-04-14 NOTE — Telephone Encounter (Signed)
TOC NEW HFU APPT [facility] FOR 04/20/2021 @ 1:15 PM WITH DR Humphrey Rolls.

## 2021-04-15 ENCOUNTER — Telehealth: Payer: Self-pay

## 2021-04-15 NOTE — Telephone Encounter (Signed)
Transition Care Management Unsuccessful Follow-up Telephone Call  Date of discharge and from where:  04/10/2021, [facility]  Attempts:  2nd Attempt  Reason for unsuccessful TCM follow-up call:  Left voice message  on # [phone].   Patient has appointment with Juluis Mire, NP at [facility] on 05/11/2021 however, he has an appointment with Internal Medicine 04/20/2021.  Need to confirm if he will be following up at [facility] or is planning to establish are with a new PCP

## 2021-04-16 ENCOUNTER — Telehealth: Payer: Self-pay

## 2021-04-16 LAB — SURGICAL PATHOLOGY

## 2021-04-16 NOTE — Telephone Encounter (Signed)
Transition Care Management Unsuccessful Follow-up Telephone Call  Date of discharge and from where:  04/10/2021, [facility]   Attempts:  3rd Attempt  Reason for unsuccessful TCM follow-up call:  Left voice message  on # [phone].   Patient has appointment with Juluis Mire, NP at [facility] on 05/11/2021 however, he has an appointment with Internal Medicine 04/20/2021.  If patient returns the call we need to confirm if he will be following up at [facility] or is planning to establish are with a new PCP

## 2021-04-17 ENCOUNTER — Other Ambulatory Visit (HOSPITAL_COMMUNITY): Payer: Self-pay

## 2021-04-17 ENCOUNTER — Other Ambulatory Visit: Payer: Self-pay

## 2021-04-17 ENCOUNTER — Ambulatory Visit (HOSPITAL_COMMUNITY)
Admit: 2021-04-17 | Discharge: 2021-04-17 | Disposition: A | Discharge status: Home / Self Care | Payer: Medicaid Other | Attending: Family Medicine | Admitting: Family Medicine

## 2021-04-17 ENCOUNTER — Encounter (HOSPITAL_COMMUNITY): Payer: Self-pay

## 2021-04-17 VITALS — BP 92/60 | HR 92 | Wt 151.2 lb

## 2021-04-17 DIAGNOSIS — I5022 Chronic systolic (congestive) heart failure: Secondary | ICD-10-CM | POA: Insufficient documentation

## 2021-04-17 DIAGNOSIS — Z79899 Other long term (current) drug therapy: Secondary | ICD-10-CM | POA: Diagnosis not present

## 2021-04-17 DIAGNOSIS — Z8616 Personal history of COVID-19: Secondary | ICD-10-CM | POA: Diagnosis not present

## 2021-04-17 DIAGNOSIS — I110 Hypertensive heart disease with heart failure: Secondary | ICD-10-CM | POA: Diagnosis not present

## 2021-04-17 DIAGNOSIS — D509 Iron deficiency anemia, unspecified: Secondary | ICD-10-CM | POA: Diagnosis not present

## 2021-04-17 DIAGNOSIS — I340 Nonrheumatic mitral (valve) insufficiency: Secondary | ICD-10-CM | POA: Diagnosis not present

## 2021-04-17 DIAGNOSIS — R634 Abnormal weight loss: Secondary | ICD-10-CM | POA: Insufficient documentation

## 2021-04-17 DIAGNOSIS — Z87891 Personal history of nicotine dependence: Secondary | ICD-10-CM | POA: Diagnosis not present

## 2021-04-17 LAB — BASIC METABOLIC PANEL
Anion gap: 8 (ref 5–15)
BUN: 6 mg/dL (ref 6–20)
CO2: 23 mmol/L (ref 22–32)
Calcium: 8.6 mg/dL — ABNORMAL LOW (ref 8.9–10.3)
Chloride: 101 mmol/L (ref 98–111)
Creatinine, Ser: 0.81 mg/dL (ref 0.61–1.24)
GFR, Estimated: >60 mL/min (ref >60)
Glucose, Bld: 92 mg/dL (ref 70–99)
Potassium: 4.2 mmol/L (ref 3.5–5.1)
Sodium: 132 mmol/L — ABNORMAL LOW (ref 135–145)

## 2021-04-17 MED ORDER — LOSARTAN POTASSIUM 25 MG PO TABS
12.5000 mg | ORAL_TABLET | Freq: Every evening | ORAL | 2 refills | Status: DC
  Filled 2021-04-17 – 2021-06-03 (×2): qty 17, 34d supply, fill #0

## 2021-04-17 MED ORDER — FUROSEMIDE 20 MG PO TABS
20.0000 mg | ORAL_TABLET | ORAL | 2 refills | Status: DC | PRN
  Filled 2021-04-17: qty 100, 34d supply, fill #0

## 2021-04-17 MED ORDER — CARVEDILOL 3.125 MG PO TABS
3.1250 mg | ORAL_TABLET | Freq: Two times a day (BID) | ORAL | 2 refills | Status: DC
  Filled 2021-04-17: qty 68, 34d supply, fill #0
  Filled 2021-07-15: qty 60, 30d supply, fill #0
  Filled 2021-08-17: qty 60, 30d supply, fill #1
  Filled 2021-09-23: qty 60, 30d supply, fill #2
  Filled 2021-10-23: qty 60, 30d supply, fill #3

## 2021-04-17 MED ORDER — SPIRONOLACTONE 25 MG PO TABS
25.0000 mg | ORAL_TABLET | Freq: Every day | ORAL | 2 refills | Status: DC
  Filled 2021-04-17: qty 34, 34d supply, fill #0

## 2021-04-17 MED ORDER — LOSARTAN POTASSIUM 25 MG PO TABS: 12.5000 mg | ORAL_TABLET | Freq: Every day | ORAL | 4 refills | Status: DC

## 2021-04-17 NOTE — Progress Notes (Signed)
Heart and Vascular Care Navigation  04/17/2021  Connor Solomon 1961-03-08 [id number]  Reason for Referral: Patient referred to CSW for financial assistance, food insecurity and no insurance.   Engaged with patient face to face for initial visit for Heart and Vascular Care Coordination.                                                                                                   Assessment:   Patient is a 65 you male who lives with his [place]. He has been working although declining Heart failure and recent hospitalization he is unable to return to work.   Patient and his SO own their home although with limited recent funds they are behind 2 months and about to have the house foreclosed on. Patient states he thinks he met with someone in the hospital regarding insurance but unsure.  Patient and SO report food insecurity and they are behind in many household bills.                               HRT/VAS Care Coordination     Patients Home Cardiology Office Heart Failure Clinic   Outpatient Care Team Social Worker   Social Worker Name: Raquel Sarna, [place] [phone]   Living arrangements for the past 2 months Single Family Home   Lives with: Self; Significant Other   Patient Has Concern With Paying Medical Bills Yes   Medical Bill Referrals: Financial Couseling/First Source   Does Patient Have Prescription Coverage? No   Patient Prescription Assistance Programs Heart Failure Fund   Home Assistive Devices/Equipment None       Social History:                                                                             SDOH Screenings   Alcohol Screen: Not on file  Depression (PHQ2-9): Medium Risk   PHQ-2 Score: 9  Financial Resource Strain: High Risk   Difficulty of Paying Living Expenses: Hard  Food Insecurity: Food Insecurity Present   Worried About Charity fundraiser in the Last Year: Sometimes true   Arboriculturist in the Last Year: Sometimes true  Housing: High Risk    Last Housing Risk Score: 2  Physical Activity: Not on file  Social Connections: Not on file  Stress: Not on file  Tobacco Use: Medium Risk   Smoking Tobacco Use: Former   Smokeless Tobacco Use: Never  Transportation Needs: No Transportation Needs   Lack of Transportation (Medical): No   Lack of Transportation (Non-Medical): No    SDOH Interventions: Financial Resources:  Sales promotion account executive Interventions: Development worker, community Vs possible disability application  Food Insecurity:  Food Insecurity Interventions: Assist with SNAP Application Referral sent to  [place]:  Housing Interventions:  (Patient Care Fund to assist with foreclosure notice)  Transportation:    N/a   Other Care Navigation Interventions:     Inpatient/Outpatient Substance Abuse Counseling/Rehab Options N/a  Provided Pharmacy assistance resources Heart Failure Fund  Patient expressed Mental Health concerns No.  Patient Referred to: N/a   Follow-up plan:  CSW left message for Financial Counseling to follow up on possible medicaid pending and awaiting return call. CSW sent referral to [facility] for food stamps. CSW will refer patient to Patient Care Fund for assistance with mortgage payment to avoid foreclosure. Patient and SO will complete paperwork and retrun next week to meet again with CSW. Patient beyond grateful for the support and assistance of the team. CSW continues to follow. Raquel Sarna, [place], [place]

## 2021-04-17 NOTE — Patient Instructions (Signed)
DECREASE Losartan to 12.5 mg (one half tab) daily at bedtime  Labs today We will only contact you if something comes back abnormal or we need to make some changes. Otherwise no news is good news!  Your physician recommends that you schedule a follow-up appointment in: 3-4 weeks  in the Advanced Practitioners (PA/NP) Clinic   Do the following things EVERYDAY: Weigh yourself in the morning before breakfast. Write it down and keep it in a log. Take your medicines as prescribed Eat low salt foods--Limit salt (sodium) to 2000 mg per day.  Stay as active as you can everyday Limit all fluids for the day to less than 2 liters  At the [facility], you and your health needs are our priority. As part of our continuing mission to provide you with exceptional heart care, we have created designated Provider Care Teams. These Care Teams include your primary Cardiologist (physician) and Advanced Practice Providers (APPs- Physician Assistants and Nurse Practitioners) who all work together to provide you with the care you need, when you need it.   You may see any of the following providers on your designated Care Team at your next follow up: Dr Glori Bickers Dr Loralie Champagne Dr Patrice Paradise, NP Lyda Jester, [place] Ginnie Smart Audry Riles, PharmD   Please be sure to bring in all your medications bottles to every appointment.   If you have any questions or concerns before your next appointment please send [place] a message through [place] or call our office at [phone].    TO LEAVE A MESSAGE FOR THE NURSE SELECT OPTION 2, PLEASE LEAVE A MESSAGE INCLUDING: YOUR NAME DATE OF BIRTH CALL BACK NUMBER REASON FOR CALL**this is important as we prioritize the call backs  YOU WILL RECEIVE A CALL BACK THE SAME DAY AS LONG AS YOU CALL BEFORE 4:00 PM

## 2021-04-17 NOTE — Progress Notes (Addendum)
Advanced Heart Failure Team Clinic Note PCP: Juluis Mire HF Cardiologist: Dr. Haroldine Laws  HPI: y/o WM w/ h/o HTN, new IDA and new systolic HF. Pt had stress echo in 2010 for CP showing no stress arrhythmias or  conduction abnormalities. The stress ECG was negative for ischemia. There was no echocardiographic evidence for stress-induced ischemia. EF normal. In 2011, he had syncope and evaluated by Dr. Harrington Challenger but this was felt to be vasovagal syncope. No further cardiac w/u.    Tested + for COVID 5/21. Did not require hospitalization but hasn't felt well since. Not vaccinated.   For the last 6 months he has noted marked fatigue + 20 lb unintentional wt loss and some occasional night sweats.   He presented to ED 04/07/21 where he was found to be in acute CHF. CXR suspicious for bronchopneumonia. Also could not r/o underlying malignancy. CT + for moderate bilateral pleural effusions and patchy airspace opacities consistent with multifocal pneumonia. He was found to be anemic w/ hgb 8.6. Iron studies c/w IDA. He has never had a colonoscopy.   Admitted by IM and started on IV Lasix. Echo showed severely reduced LVEF, 20-25% w/ glogal HK, Grade III DD (restrictive), RV normal. Mod-severe MR. No LVH. 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 ESR 142, autoimmune workup in progress per primary team. cMRI, MM panel and pending at time of discharge. MM panel and urine immunofixation pending at time of discharge. Discharged on spiro, carvedilol and losartan (bp too soft for Entresto).   Today he returns for HF follow up. Overall feeling worse, SOB with minimal activity. CP with inspiration/expiration. Denies increasing dizziness, edema, or PND/Orthopnea. Appetite ok. No fever or chills. Weight at home 151 pounds. Taking all medications. Has not needed lasix.  Cardiac Studies:   - R/LHC (6/22): Normal cors, severe MICM EF 25%, low filling  pressures with normal CO.   Ao =98/68 (82) LV = 81/3 RA = 1 RV = 27/2 PA = 25/8 (16) PCW = 10 Fick cardiac output/index = 5.8/3.2 PVR = 1.0 WU Ao sat = 100% PA sat = 66%, 70%   - Echo (6/22): EF 20-25%, Grade III DD, RV ok, mod/severe MR  ROS: All systems negative except as listed in HPI, PMH and Problem List.  SH:  Social History   Socioeconomic History   Marital status: Single    Spouse name: Not on file   Number of children: Not on file   Years of education: Not on file   Highest education level: Not on file  Occupational History   Not on file  Tobacco Use   Smoking status: Former    Pack years: 0.00    Types: Cigars    Quit date: 10/11/2020    Years since quitting: 0.5   Smokeless tobacco: Never   Tobacco comments:    smokes 1 cigar a week  Substance and Sexual Activity   Alcohol use: No   Drug use: No   Sexual activity: Not on file  Other Topics Concern   Not on file  Social History Narrative   Not on file   Social Determinants of Health   Financial Resource Strain: High Risk   Difficulty of Paying Living Expenses: Hard  Food Insecurity: Food Insecurity Present   Worried About Running Out of Food in the Last Year: Sometimes true   Ran Out of Food in the Last Year: Sometimes true  Transportation Needs: No Transportation Needs  Lack of Transportation (Medical): No   Lack of Transportation (Non-Medical): No  Physical Activity: Not on file  Stress: Not on file  Social Connections: Not on file  Intimate Partner Violence: Not on file    FH:  Family History  Problem Relation Age of Onset   Heart attack Father    Sudden Cardiac Death Father 40   Diabetes Neg Hx    Hyperlipidemia Neg Hx    Hypertension Neg Hx     Past Medical History:  Diagnosis Date   Arthritis    hands, elbows bilaterally   Full dentures    Hypertension    Pt states he doen not have HTN and has never been treated for HTN   Kidney stone on left side last stone 4-5 yrs ago    recurrent (3 episodes)   Panic disorder    pt states has resolved   Syncope    resolved- was related to panic attacks    Current Outpatient Medications  Medication Sig Dispense Refill   albuterol (VENTOLIN HFA) 108 (90 Base) MCG/ACT inhaler Inhale 2 puffs into the lungs every 6 (six) hours as needed for wheezing or shortness of breath. 8 g 2   carvedilol (COREG) 3.125 MG tablet Take 1 tablet (3.125 mg total) by mouth 2 (two) times daily with a meal. 60 tablet 0   ferrous sulfate 300 (60 Fe) MG/5ML syrup Take 5 mLs (300 mg total) by mouth daily. 150 mL 3   furosemide (LASIX) 20 MG tablet 20 mg tablet daily for increase in 3 pounds over 24 hours 30 tablet 0   losartan (COZAAR) 25 MG tablet Take 1 tablet (25 mg total) by mouth daily. 30 tablet 0   spironolactone (ALDACTONE) 25 MG tablet Take 1 tablet (25 mg total) by mouth daily. 30 tablet 0   No current facility-administered medications for this encounter.   BP 92/60   Pulse 92   Wt 68.6 kg   SpO2 99%   BMI 22.99 kg/m   Wt Readings from Last 3 Encounters:  04/17/21 68.6 kg  04/10/21 67.9 kg  03/25/21 69.9 kg   PHYSICAL EXAM: General:  NAD. No resp difficulty, thin HEENT: Normal Neck: Supple. No JVD. Carotids 2+ bilat; no bruits. No lymphadenopathy or thryomegaly appreciated. Cor: PMI nondisplaced. Regular rate & rhythm. No rubs, gallops or murmurs. Lungs: Clear Abdomen: Soft, nontender, nondistended. No hepatosplenomegaly. No bruits or masses. Good bowel sounds. Extremities: No cyanosis, clubbing, rash, edema Neuro: Alert & oriented x 3, cranial nerves grossly intact. Moves all 4 extremities w/o difficulty. Affect pleasant.  ECG: SR, ?LVH 95 bpm (personally reviewed). Reds: 41%  ASSESSMENT & PLAN:  1. Chronic Systolic Heart Failure - Stress Echo 2010: normal EF. No evidence of ischemia - Echo 6/22: EF 20-25%, GIIDD (restrictive), no LVH. RV normal - ? Viral CM (COVID + 2021), ? Familia (FH of [facility]). TSH normal, HIV  pending - LHC/RHC (6/22): with normal cors, low filling pressures, and normal cardiac output - Also ? Infiltrative CM. cMRI EF 25%, normal RV, Basal septal midwall LGE, mild MR.  - MM panel urine immunofixation-->pending.  - NYHA III-IIIb - Volume status stable on exam and by weight. Does not need scheduled diuretics. Reds likely elevated due to known pleural effusions/PNA.  - Continue spiro 25 mg daily.  - Decrease losartan to 12.5 and change to qhs. - Hold off on Entresto w/ soft BP. - SGLT2i soon (Hgb A1c 5.6).  - BMET today.  2.  Mitral Regurgitation -  Mod-severe, likely functional from dilated LV.   3.  Iron Deficiency Anemia - Needs colonoscopy. Unintentional 20 lb wt loss concerning. CEA 1.5. LDH low. - Recent Hgb 9.5.        4.  Unintentional Weight Loss - PCP pursuing autoimmune and malignancy w/u - CEA level -->1.5, ESR 142. CT C/A/P unremarkable. - ? Castleman's diease-related. - Working up outpatient.    Follow up in 3 weeks for further medication titration. Anticipate adding SGLT2i & Entresto soon. Will have discussion about cMRI findings.  Allena Katz, FNP-BC 04/17/21

## 2021-04-17 NOTE — Progress Notes (Signed)
   ReDS Vest / Clip - 04/17/21 1500       ReDS Vest / Clip   Station Marker C    Ruler Value 25    ReDS Value Range High volume overload    ReDS Actual Value 41

## 2021-04-20 ENCOUNTER — Encounter: Payer: Self-pay | Admitting: Internal Medicine

## 2021-04-20 ENCOUNTER — Other Ambulatory Visit: Payer: Self-pay

## 2021-04-20 ENCOUNTER — Telehealth (HOSPITAL_COMMUNITY): Payer: Self-pay | Admitting: Licensed Clinical Social Worker

## 2021-04-20 ENCOUNTER — Ambulatory Visit (INDEPENDENT_AMBULATORY_CARE_PROVIDER_SITE_OTHER): Payer: Self-pay | Admitting: Internal Medicine

## 2021-04-20 VITALS — BP 121/73 | HR 93 | Temp 97.4°F | Ht 68.0 in | Wt 156.1 lb

## 2021-04-20 DIAGNOSIS — I340 Nonrheumatic mitral (valve) insufficiency: Secondary | ICD-10-CM

## 2021-04-20 DIAGNOSIS — I5020 Unspecified systolic (congestive) heart failure: Secondary | ICD-10-CM

## 2021-04-20 DIAGNOSIS — R0781 Pleurodynia: Secondary | ICD-10-CM

## 2021-04-20 DIAGNOSIS — I10 Essential (primary) hypertension: Secondary | ICD-10-CM

## 2021-04-20 DIAGNOSIS — D472 Monoclonal gammopathy: Secondary | ICD-10-CM

## 2021-04-20 DIAGNOSIS — R634 Abnormal weight loss: Secondary | ICD-10-CM

## 2021-04-20 DIAGNOSIS — H9312 Tinnitus, left ear: Secondary | ICD-10-CM

## 2021-04-20 NOTE — Telephone Encounter (Signed)
Patient called to share that he has been unable to compete the w9. Patient asked CSW to speak with the [employer] rep to explain the need for the [place] for financial assistance through the [employer] [place] for past due mortgage payment to avoid foreclosure. Patient late came by clinic to share that the [facility] rep did not complete but provided contact info for [employer]. CSW will contact directly and attempt to get [place] completed. Patient and wife grateful for the support. CSW will stay in contact with patient until resolved. Raquel Sarna, [place], [place]

## 2021-04-20 NOTE — Patient Instructions (Addendum)
It was nice seeing you today! Thank you for choosing [facility] for your Primary Care.    Today we talked about:   Heart Failure: Continue your medications as prescribed. If the outpatient pharmacy medications are more than $4, call our clinic  I will work on arranging Heme/Oncology follow up. I will continue the work up today with some more blood work.   For the ringing in your ears and dizziness, eventually it may be a good idea to see a Ear doctor, but for now, we will focus on the heart.   If you pain with breathing worsens, please call our clinic immediately.

## 2021-04-20 NOTE — Progress Notes (Signed)
CC: HF follow up  HPI:  Connor Solomon is a 60 y.o. with a PMHx as listed below who presents to the clinic for HF follow up.   Please see the Encounters tab for problem-based Assessment & Plan regarding status of patient's acute and chronic conditions.  Past Medical History:  Diagnosis Date   Arthritis    hands, elbows bilaterally   Full dentures    Hypertension    Pt states he doen not have HTN and has never been treated for HTN   Kidney stone on left side last stone 4-5 yrs ago   recurrent (3 episodes)   Panic disorder    pt states has resolved   Syncope    resolved- was related to panic attacks   Review of Systems: Review of Systems  Constitutional:  Positive for malaise/fatigue. Negative for chills, diaphoresis, fever and weight loss.  HENT:  Positive for tinnitus. Negative for ear discharge, ear pain and hearing loss.   Respiratory:  Positive for cough, sputum production and shortness of breath.   Cardiovascular:  Negative for chest pain, palpitations, orthopnea and leg swelling.  Gastrointestinal:  Positive for nausea. Negative for abdominal pain, constipation, diarrhea and vomiting.  Neurological:  Positive for dizziness. Negative for focal weakness and weakness.  Psychiatric/Behavioral:  Negative for depression and suicidal ideas. The patient is nervous/anxious.    Physical Exam:  Vitals:   04/20/21 1349  BP: 121/73  Pulse: 93  Temp: (!) 97.4 F (36.3 C)  TempSrc: Oral  SpO2: 99%  Weight: 156 lb 1.6 oz (70.8 kg)  Height: 5\' 8"  (1.727 m)   Physical Exam Vitals and nursing note reviewed.  Constitutional:      General: He is not in acute distress.    Appearance: He is normal weight.  HENT:     Head: Normocephalic and atraumatic.     Right Ear: There is impacted cerumen.     Left Ear: No drainage.  No middle ear effusion. Tympanic membrane is not scarred, perforated, erythematous, retracted or bulging.     Mouth/Throat:     Mouth: Mucous membranes are  moist.     Pharynx: Oropharynx is clear.  Eyes:     Extraocular Movements: Extraocular movements intact.     Conjunctiva/sclera: Conjunctivae normal.     Pupils: Pupils are equal, round, and reactive to light.  Cardiovascular:     Rate and Rhythm: Normal rate and regular rhythm.  Pulmonary:     Effort: Pulmonary effort is normal. No respiratory distress.     Breath sounds: Normal breath sounds. No wheezing, rhonchi or rales.  Abdominal:     General: Bowel sounds are normal.     Palpations: Abdomen is soft.  Musculoskeletal:     Right lower leg: No edema.     Left lower leg: No edema.  Skin:    General: Skin is warm and dry.  Neurological:     General: No focal deficit present.     Mental Status: He is alert and oriented to person, place, and time. Mental status is at baseline.     Gait: Gait normal.  Psychiatric:        Mood and Affect: Mood normal.        Behavior: Behavior normal.        Thought Content: Thought content normal.        Judgment: Judgment normal.   Assessment & Plan:   See Encounters Tab for problem based charting.  Patient discussed with  Dr. Evette Doffing

## 2021-04-21 ENCOUNTER — Telehealth (HOSPITAL_COMMUNITY): Payer: Self-pay | Admitting: Licensed Clinical Social Worker

## 2021-04-21 DIAGNOSIS — H9312 Tinnitus, left ear: Secondary | ICD-10-CM | POA: Insufficient documentation

## 2021-04-21 LAB — KAPPA/LAMBDA LIGHT CHAINS
Ig Kappa Free Light Chain: 977.2 mg/L — ABNORMAL HIGH (ref 3.3–19.4)
Ig Lambda Free Light Chain: 7.8 mg/L (ref 5.7–26.3)
KAPPA/LAMBDA RATIO: 125.28 — ABNORMAL HIGH (ref 0.26–1.65)

## 2021-04-21 LAB — VISCOSITY, SERUM: Viscosity, Serum: 1.8 rel.saline (ref 1.4–2.1)

## 2021-04-21 NOTE — Assessment & Plan Note (Signed)
BP: 121/73   Previous history of hypertension past with normalization of blood pressure prior to recent admission, likely in the setting of unintentional weight loss.  On admission, his blood pressure was 130/72.  He is currently on antihypertensives, however this is for his HFrEF.

## 2021-04-21 NOTE — Assessment & Plan Note (Signed)
Mr. Gombert noted on recent hospitalization that he has experienced a 25 pound unintentional weight loss that he attributes to nausea and vomiting over the past 1 month.  The symptoms have improved since he was discharged and is excited to be gaining weight.  Assessment/plan: Extensive evaluation on recent admission for underlying malignancy versus autoimmune process to be responsible for patient's unintentional weight loss.  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  There is a chance that it is secondary to Waldenstrom's macroglobulinemia, which may explain patient's symptoms.  Differential also includes cardiogenic cachexia in the setting of HFrEF.  At this time, patient is slowly gaining weight and there is no evidence of hypervolemia on examination.  - Weigh each office visit for close monitoring - Encouraged low-salt diet

## 2021-04-21 NOTE — Assessment & Plan Note (Signed)
Severe to moderate mitral regurg noted on patient's initial echo on hospital admission.  Likely secondary to left ventricle abnormality.  No indication for further intervention at this time.

## 2021-04-21 NOTE — Assessment & Plan Note (Addendum)
Mr. Lumley was recently admitted to [facility] in June 2022 at which time he presented with unintentional weight loss, dyspnea on exertion, orthopnea.  Echo was obtained that demonstrated an ejection fraction of 20 to 25% with global hypokinesis of the left ventricle, in addition to moderate dilation.  Diastolic parameters were consistent with a restrictive dysfunction.  RV function was within normal limits.  Patient was started on IV diuresis and cardiology was subsequently consulted.  Left and right heart cath were pursued and showed normal coronaries indicating HFrEF secondary to nonischemic cardiomyopathy.  Cardiac output was maintained within normal limits.  Prior to discharge, patient was started on Coreg, losartan, spironolactone, and Lasix as needed.  Today, Mr. Eskenazi states that he has been able to sleep lying more flat, however continues to have dyspnea on exertion that is very slowly getting better.  He is tolerating his medications well without any difficulty.  He notes his losartan dose was recently cut in half by the heart failure clinic.  Assessment/plan: Etiology of nonischemic cardiomyopathy undetermined at this time.  Cardiac MRI was obtained that showed late gadolinium enhancement in the basal septal region consistent with a scar of undetermined etiology.  ESR and CRP were markedly elevated concerning for underlying malignancy or autoimmune process. Work-up includes negative ANA, CEA, LDH.  CT abdomen/pelvis showed stable lymphadenopathy with multiple myeloma panel showing a IgM monoclonal gammopathy, however discussed with Dr. Haroldine Laws who does not feel this is contributing to patient's cardiomyopathy.  At this time, will continue guideline directed medical therapy as tolerated by patient's blood pressure.  - Continue carvedilol 3.125 twice daily, losartan 12.5 mg nightly, spironolactone 25 mg daily - Continue Lasix 20 mg as needed for lower extremity swelling or increased  orthopnea

## 2021-04-21 NOTE — Telephone Encounter (Signed)
CSW received return call form  patient and informed of Patient Care Fund assistance with mortgage. CSW will assist with disability referral to the [facility] to begin the application process as it appears patient could be eligible. CSW will meet with patient in person to sign and complete application. Raquel Sarna, [place], [place]

## 2021-04-21 NOTE — Assessment & Plan Note (Signed)
Connor Solomon states that he has been experiencing pain on the inferior portion of his anterior chest bilaterally.  Pain was initially intermittent, however has become persistent over the last several days.  The pain is exacerbated by deep breaths in and out.  Alleviating factors include coughing up mucus.  He is most concerned regarding etiology of the pain and if anything can be used to relieve it.  Assessment/plan: Etiology undetermined at this time.  Recent EKG 3 days prior does not show any evidence of pericarditis.  Likely pleuritic inflammation.  Pulmonary examination is reassuring.  No indication at this time for imaging, however if symptoms persist consider CT chest.

## 2021-04-21 NOTE — Telephone Encounter (Signed)
CSW completed paperwork for Patient Care Assistance with patient's mortgage. Check will be issued directly to [facility]. CSW attempted to reach patient to share the news with no answer and message left for return call. Raquel Sarna, [place], [place]

## 2021-04-21 NOTE — Assessment & Plan Note (Addendum)
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  In regards to CRAB criteria, patient history only significant for anemia; hence unlikely to be multiple myeloma.    Differential diagnosis at this time includes IgM MGUS versus Waldenstrom macroglobulinemia.  Will refer to oncology for bone marrow biopsy and further investigation.    Of note, serum viscosity has not been evaluated, however patient does have some concerning symptoms including tinnitus and dizziness that has been ongoing for 6 months.  - Referral to oncology, specifically Dr. Lorenso Courier placed - Serum viscosity pending - Kappa lambda light chains pending  ADDENDUM:  Serum viscosity is unremarkable. Kappa/lamda ratio markedly elevated.

## 2021-04-21 NOTE — Assessment & Plan Note (Signed)
Mr. Sear states that he has been experiencing ringing in his left ear with associated dizziness for at least 6 months.  The tinnitus is intermittent but present throughout most of the day.  He cannot recall any exacerbating factors but notes that occasionally some positions does make the tinnitus better.  He denies any falls with associated dizziness and feels that he compensates well for it.  It has not affected his gait.  He denies any previous history of tendinitis or ear injuries.  Assessment/plan: Examination include cerumen impaction of the right ear canal; left ear canal is unremarkable without any abnormalities of the tympanic membrane noted.   Given that tinnitus started before Lasix treatment, unlikely this is related to Lasix ototoxicity.  Differential diagnosis also includes hyperviscosity syndrome versus tinnitus related to the auditory system.  Will eventually benefit from ENT referral once the patient obtains health insurance.  - Serum viscosity pending - Consider ENT referral once health insurance is active

## 2021-04-22 ENCOUNTER — Telehealth (HOSPITAL_COMMUNITY): Payer: Self-pay | Admitting: Licensed Clinical Social Worker

## 2021-04-22 NOTE — Telephone Encounter (Signed)
Patient called to inquire about disability/medicaid  as he states that he has not heard anything and there is no pending medicaid. Patient states concern about upcoming bills and need for financial assistance. CSW will meet with patient to complete a disability referral to [place]. Raquel Sarna, [place], [place]

## 2021-04-23 NOTE — Progress Notes (Signed)
Internal Medicine Clinic Attending  Case discussed with Dr. Basaraba  At the time of the visit.  We reviewed the resident's history and exam and pertinent patient test results.  I agree with the assessment, diagnosis, and plan of care documented in the resident's note.  

## 2021-04-24 ENCOUNTER — Telehealth (HOSPITAL_COMMUNITY): Payer: Self-pay | Admitting: Licensed Clinical Social Worker

## 2021-04-24 NOTE — Telephone Encounter (Signed)
CSW came by the clinic to sign the [facility] disability application. Patient shared concerns of financial need going forward until his disability comes through as he has no savings and currently unable to work. CSW assisted earlier this week with mortgage payment and will assist with some of his [employer] bill on next clinic visit. Patient and SO grateful for the support and assistance from the team. CSW sent over the disability referral to [place]. CSW will follow up with patient at next visit. Raquel Sarna, [place], [place]

## 2021-04-27 ENCOUNTER — Other Ambulatory Visit (HOSPITAL_COMMUNITY): Payer: Self-pay

## 2021-05-02 ENCOUNTER — Other Ambulatory Visit: Payer: Self-pay | Admitting: Internal Medicine

## 2021-05-06 ENCOUNTER — Inpatient Hospital Stay (HOSPITAL_BASED_OUTPATIENT_CLINIC_OR_DEPARTMENT_OTHER): Payer: Medicaid Other | Admitting: Hematology and Oncology

## 2021-05-06 ENCOUNTER — Inpatient Hospital Stay: Payer: Medicaid Other | Attending: Hematology and Oncology

## 2021-05-06 ENCOUNTER — Other Ambulatory Visit: Payer: Self-pay

## 2021-05-06 VITALS — BP 98/77 | HR 80 | Temp 97.5°F | Resp 18 | Wt 150.2 lb

## 2021-05-06 DIAGNOSIS — R5383 Other fatigue: Secondary | ICD-10-CM | POA: Diagnosis not present

## 2021-05-06 DIAGNOSIS — Z8249 Family history of ischemic heart disease and other diseases of the circulatory system: Secondary | ICD-10-CM | POA: Diagnosis not present

## 2021-05-06 DIAGNOSIS — I803 Phlebitis and thrombophlebitis of lower extremities, unspecified: Secondary | ICD-10-CM

## 2021-05-06 DIAGNOSIS — Z5941 Food insecurity: Secondary | ICD-10-CM | POA: Diagnosis not present

## 2021-05-06 DIAGNOSIS — Z596 Low income: Secondary | ICD-10-CM | POA: Insufficient documentation

## 2021-05-06 DIAGNOSIS — R059 Cough, unspecified: Secondary | ICD-10-CM | POA: Insufficient documentation

## 2021-05-06 DIAGNOSIS — M954 Acquired deformity of chest and rib: Secondary | ICD-10-CM | POA: Insufficient documentation

## 2021-05-06 DIAGNOSIS — I110 Hypertensive heart disease with heart failure: Secondary | ICD-10-CM | POA: Diagnosis not present

## 2021-05-06 DIAGNOSIS — I083 Combined rheumatic disorders of mitral, aortic and tricuspid valves: Secondary | ICD-10-CM | POA: Diagnosis not present

## 2021-05-06 DIAGNOSIS — D472 Monoclonal gammopathy: Secondary | ICD-10-CM

## 2021-05-06 DIAGNOSIS — I700 Atherosclerosis of aorta: Secondary | ICD-10-CM | POA: Insufficient documentation

## 2021-05-06 DIAGNOSIS — M47814 Spondylosis without myelopathy or radiculopathy, thoracic region: Secondary | ICD-10-CM

## 2021-05-06 DIAGNOSIS — D509 Iron deficiency anemia, unspecified: Secondary | ICD-10-CM | POA: Diagnosis not present

## 2021-05-06 DIAGNOSIS — R590 Localized enlarged lymph nodes: Secondary | ICD-10-CM | POA: Insufficient documentation

## 2021-05-06 DIAGNOSIS — Z79899 Other long term (current) drug therapy: Secondary | ICD-10-CM | POA: Diagnosis not present

## 2021-05-06 DIAGNOSIS — I5022 Chronic systolic (congestive) heart failure: Secondary | ICD-10-CM | POA: Insufficient documentation

## 2021-05-06 DIAGNOSIS — Z823 Family history of stroke: Secondary | ICD-10-CM | POA: Diagnosis not present

## 2021-05-06 DIAGNOSIS — D500 Iron deficiency anemia secondary to blood loss (chronic): Secondary | ICD-10-CM

## 2021-05-06 DIAGNOSIS — J90 Pleural effusion, not elsewhere classified: Secondary | ICD-10-CM | POA: Insufficient documentation

## 2021-05-06 DIAGNOSIS — R0602 Shortness of breath: Secondary | ICD-10-CM | POA: Insufficient documentation

## 2021-05-06 DIAGNOSIS — Z885 Allergy status to narcotic agent status: Secondary | ICD-10-CM

## 2021-05-06 LAB — CMP (CANCER CENTER ONLY)
ALT: 7 U/L (ref 0–44)
AST: 10 U/L — ABNORMAL LOW (ref 15–41)
Albumin: 2.6 g/dL — ABNORMAL LOW (ref 3.5–5.0)
Alkaline Phosphatase: 92 U/L (ref 38–126)
Anion gap: 10 (ref 5–15)
BUN: 9 mg/dL (ref 6–20)
CO2: 25 mmol/L (ref 22–32)
Calcium: 9.8 mg/dL (ref 8.9–10.3)
Chloride: 101 mmol/L (ref 98–111)
Creatinine: 0.86 mg/dL (ref 0.61–1.24)
GFR, Estimated: >60 mL/min (ref >60)
Glucose, Bld: 88 mg/dL (ref 70–99)
Potassium: 4.5 mmol/L (ref 3.5–5.1)
Sodium: 136 mmol/L (ref 135–145)
Total Bilirubin: 0.5 mg/dL (ref 0.3–1.2)
Total Protein: 7.9 g/dL (ref 6.5–8.1)

## 2021-05-06 LAB — CBC WITH DIFFERENTIAL (CANCER CENTER ONLY)
Abs Immature Granulocytes: 0.04 10*3/uL (ref 0.00–0.07)
Basophils Absolute: 0 10*3/uL (ref 0.0–0.1)
Basophils Relative: 1 %
Eosinophils Absolute: 0.4 10*3/uL (ref 0.0–0.5)
Eosinophils Relative: 5 %
HCT: 29.5 % — ABNORMAL LOW (ref 39.0–52.0)
Hemoglobin: 9.1 g/dL — ABNORMAL LOW (ref 13.0–17.0)
Immature Granulocytes: 1 %
Lymphocytes Relative: 25 %
Lymphs Abs: 1.8 10*3/uL (ref 0.7–4.0)
MCH: 23.2 pg — ABNORMAL LOW (ref 26.0–34.0)
MCHC: 30.8 g/dL (ref 30.0–36.0)
MCV: 75.3 fL — ABNORMAL LOW (ref 80.0–100.0)
Monocytes Absolute: 0.7 10*3/uL (ref 0.1–1.0)
Monocytes Relative: 10 %
Neutro Abs: 4.4 10*3/uL (ref 1.7–7.7)
Neutrophils Relative %: 58 %
Platelet Count: 516 10*3/uL — ABNORMAL HIGH (ref 150–400)
RBC: 3.92 MIL/uL — ABNORMAL LOW (ref 4.22–5.81)
RDW: 16.8 % — ABNORMAL HIGH (ref 11.5–15.5)
WBC Count: 7.3 10*3/uL (ref 4.0–10.5)
nRBC: 0 % (ref 0.0–0.2)

## 2021-05-06 LAB — LACTATE DEHYDROGENASE: LDH: 73 U/L — ABNORMAL LOW (ref 98–192)

## 2021-05-06 LAB — URIC ACID: Uric Acid, Serum: 4.4 mg/dL (ref 3.7–8.6)

## 2021-05-06 NOTE — Progress Notes (Signed)
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  04/08/2021: IgM Kappa M protein 1.2 04/09/2021: WBC 11.1, Hgb 9.5, MCV 75.4, Plt 525 04/20/2021: Kappa 977, Lambda 7.8, K/L ratio 125.28. Serum viscosity 1.8  CHIEF COMPLAINTS/PURPOSE OF CONSULTATION:  "IgM monoclonal Gammopathy "  HISTORY OF PRESENTING ILLNESS:  Connor Solomon 60 y.o. male with medical history significant for CHF, HTN, and arthritis who presents for evaluation of an IgM monoclonal gammopathy.   On review of the previous records Connor Solomon had a CT scan on 04/07/2021 due to unexplained weight loss and anemia.  That CT scan showed mildly enlarged retroperitoneal lymph nodes and bilateral inguinal lymph nodes.  He was also found to have an IgM kappa monoclonal protein at 1.2 with kappa light chains at 977, lambda at 7.8, and a kappa lambda ratio of 125.  Serum viscosity was found to be 1.8.  His CBC on 04/09/2021 showed a white blood cell count 11.1, hemoglobin 9.5, and platelet count of 525.  Due to concern for these findings patient was referred to hematology for further evaluation and management.  On exam today Connor Solomon is accompanied by his wife.  He reports that he recently had a hospitalization due to trouble breathing and pneumonia and concerned about his heart function.  He notes that he does still remain short of breath and is having trouble with energy.  He denies having issues with bleeding, bruising, or dark stools.  He denies any nausea, vomiting, or diarrhea.  He notes he is never undergone a colonoscopy.  He reports that he did have a wrist surgery approximately 5 years ago but is never had any other surgical procedures.  On  further discussion he reports that he is a never smoker and does not drink alcohol.  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  He notes that his brother had a "rare blood disease" but is not sure what it was.  He had another brother with throat cancer and mother with brain cancer.  His mother had a stroke at age 54 and his father passed away of a myocardial infarction at age 25.  His primary issue is decreased energy and fatigue.  He denies any vision changes or headache.  Full 10 point ROS is listed below.  MEDICAL HISTORY:  Past Medical History:  Diagnosis Date   Acute heart failure ([place]) 04/08/2021   Acute HFrEF (heart failure with reduced ejection fraction) ([place]) 04/07/2021   Arthritis    hands, elbows bilaterally   Full dentures    Hypertension    Pt states he doen not have HTN and has never been treated for HTN   Kidney stone on left side last stone 4-5 yrs ago   recurrent (3 episodes)   Panic disorder    pt states has resolved   Syncope    resolved- was related to panic attacks    SURGICAL HISTORY: Past Surgical History:  Procedure Laterality Date   CARPAL METACARPAL FUSION WITH DISTAL RADIAL BONE GRAFT Left 05/30/2013   Procedure: LEFT THUMB METACARPAL JOINT FUSION;  Surgeon: Schuyler Amor, MD;  Location: [facility];  Service: Orthopedics;  Laterality: Left;   ESOPHAGOGASTRODUODENOSCOPY N/A 02/12/2014   Procedure: ESOPHAGOGASTRODUODENOSCOPY (EGD);  Surgeon:  Jerene Bears, MD;  Location: [facility] ENDOSCOPY;  Service: Endoscopy;  Laterality: N/A;   FOREIGN BODY REMOVAL N/A 02/12/2014   Procedure: FOREIGN BODY REMOVAL;  Surgeon: Jerene Bears, MD;  Location: [facility];  Service: Endoscopy;  Laterality: N/A;   OPEN REDUCTION INTERNAL FIXATION (ORIF) DISTAL RADIAL FRACTURE Left 11/29/2012   Procedure: OPEN REDUCTION INTERNAL FIXATION (ORIF) DISTAL RADIAL FRACTURE;  Surgeon: Schuyler Amor, MD;  Location: [facility];  Service:  Orthopedics;  Laterality: Left;  LEFT DISTAL RADIUS OSTEOTOMY WITH BONE GRAFT   RIGHT/LEFT HEART CATH AND CORONARY ANGIOGRAPHY N/A 04/09/2021   Procedure: RIGHT/LEFT HEART CATH AND CORONARY ANGIOGRAPHY;  Surgeon: Jolaine Artist, MD;  Location: [facility];  Service: Cardiovascular;  Laterality: N/A;   TENDON REPAIR Left 02/07/2013   Procedure: LEFT EXTENSOR INDICIS PROPRIUS  TO EXTENSOR POLLICIS LONGUS TENDON TRANSFER;  Surgeon: Schuyler Amor, MD;  Location: [facility];  Service: Orthopedics;  Laterality: Left;   WISDOM TOOTH EXTRACTION     WRIST SURGERY     fx only    SOCIAL HISTORY: Social History   Socioeconomic History   Marital status: Single    Spouse name: Not on file   Number of children: Not on file   Years of education: Not on file   Highest education level: Not on file  Occupational History   Not on file  Tobacco Use   Smoking status: Former    Types: Cigars    Quit date: 10/11/2020    Years since quitting: 0.5   Smokeless tobacco: Never   Tobacco comments:    smokes 1 cigar a week  Substance and Sexual Activity   Alcohol use: No   Drug use: No   Sexual activity: Not on file  Other Topics Concern   Not on file  Social History Narrative   Not on file   Social Determinants of Health   Financial Resource Strain: High Risk   Difficulty of Paying Living Expenses: Hard  Food Insecurity: Food Insecurity Present   Worried About [place] in the Last Year: Sometimes true   Ran Out of Food in the Last Year: Sometimes true  Transportation Needs: No Transportation Needs   Lack of Transportation (Medical): No   Lack of Transportation (Non-Medical): No  Physical Activity: Not on file  Stress: Not on file  Social Connections: Not on file  Intimate Partner Violence: Not on file    FAMILY HISTORY: Family History  Problem Relation Age of Onset   Heart attack Father    Sudden Cardiac Death Father 44   Diabetes Neg Hx     Hyperlipidemia Neg Hx    Hypertension Neg Hx     ALLERGIES:  is allergic to hydrocodone.  MEDICATIONS:  Current Outpatient Medications  Medication Sig Dispense Refill   albuterol (VENTOLIN HFA) 108 (90 Base) MCG/ACT inhaler Inhale 2 puffs into the lungs every 6 (six) hours as needed for wheezing or shortness of breath. 8 g 2   carvedilol (COREG) 3.125 MG tablet Take 1 tablet (3.125 mg total) by mouth 2 (two) times daily. 68 tablet 2   ferrous sulfate 300 (60 Fe) MG/5ML syrup Take 5 mLs (300 mg total) by mouth daily. 150 mL 3   furosemide (LASIX) 20 MG tablet Take 1 tablet (20 mg total) by mouth as needed for swelling. (Patient not taking: Reported on 05/06/2021) 100 tablet 2   losartan (COZAAR) 25 MG tablet Take 0.5 tablets (12.5 mg  total) by mouth at bedtime. 17 tablet 2   spironolactone (ALDACTONE) 25 MG tablet Take 1 tablet (25 mg total) by mouth daily. 34 tablet 2   No current facility-administered medications for this visit.    REVIEW OF SYSTEMS:   Constitutional: ( - ) fevers, ( - )  chills , ( - ) night sweats Eyes: ( - ) blurriness of vision, ( - ) double vision, ( - ) watery eyes Ears, nose, mouth, throat, and face: ( - ) mucositis, ( - ) sore throat Respiratory: ( - ) cough, ( - ) dyspnea, ( - ) wheezes Cardiovascular: ( - ) palpitation, ( - ) chest discomfort, ( - ) lower extremity swelling Gastrointestinal:  ( - ) nausea, ( - ) heartburn, ( - ) change in bowel habits Skin: ( - ) abnormal skin rashes Lymphatics: ( - ) new lymphadenopathy, ( - ) easy bruising Neurological: ( - ) numbness, ( - ) tingling, ( - ) new weaknesses Behavioral/Psych: ( - ) mood change, ( - ) new changes  All other systems were reviewed with the patient and are negative.  PHYSICAL EXAMINATION: ECOG PERFORMANCE STATUS: 1 - Symptomatic but completely ambulatory  Vitals:   05/06/21 1259  BP: 98/77  Pulse: 80  Resp: 18  Temp: (!) 97.5 F (36.4 C)  SpO2: 100%   Filed Weights   05/06/21 1259   Weight: 150 lb 3.2 oz (68.1 kg)    GENERAL: well appearing middle-aged Caucasian male in NAD  SKIN: skin color, texture, turgor are normal, no rashes or significant lesions EYES: conjunctiva are pink and non-injected, sclera clear NECK: supple, non-tender LYMPH:  no palpable lymphadenopathy in the cervical, axillary or supraclavicular lymph nodes.  LUNGS: clear to auscultation and percussion with normal breathing effort HEART: regular rate & rhythm and no murmurs and no lower extremity edema PSYCH: alert & oriented x 3, fluent speech NEURO: no focal motor/sensory deficits  LABORATORY DATA:  I have reviewed the data as listed CBC Latest Ref Rng & Units 05/06/2021 04/09/2021 04/09/2021  WBC 4.0 - 10.5 K/uL 7.3 - -  Hemoglobin 13.0 - 17.0 g/dL 9.1(L) 9.9(L) 9.2(L)  Hematocrit 39.0 - 52.0 % 29.5(L) 29.0(L) 27.0(L)  Platelets 150 - 400 K/uL 516(H) - -    CMP Latest Ref Rng & Units 05/06/2021 04/17/2021 04/10/2021  Glucose 70 - 99 mg/dL 88 92 123(H)  BUN 6 - 20 mg/dL _0 Creatinine 0.61 - 1.24 mg/dL 0.86 0.81 0.99  Sodium 135 - 145 mmol/L 136 132(L) 134(L)  Potassium 3.5 - 5.1 mmol/L 4.5 4.2 4.3  Chloride 98 - 111 mmol/L 101 101 100  CO2 22 - 32 mmol/L _1 Calcium 8.9 - 10.3 mg/dL 9.8 8.6(L) 8.9  Total Protein 6.5 - 8.1 g/dL 7.9 - -  Total Bilirubin 0.3 - 1.2 mg/dL 0.5 - -  Alkaline Phos 38 - 126 U/L 92 - -  AST 15 - 41 U/L 10(L) - -  ALT 0 - 44 U/L 7 - -    RADIOGRAPHIC STUDIES:  CT Angio Chest PE W and/or Wo Contrast  Result Date: 04/07/2021 CLINICAL DATA:  Increasing shortness of breath EXAM: CT ANGIOGRAPHY CHEST WITH CONTRAST TECHNIQUE: Multidetector CT imaging of the chest was performed using the standard protocol during bolus administration of intravenous contrast. Multiplanar CT image reconstructions and MIPs were obtained to evaluate the vascular anatomy. CONTRAST:  71m OMNIPAQUE IOHEXOL 350 MG/ML SOLN COMPARISON:  Film from earlier in the same day.  FINDINGS:  Cardiovascular: Thoracic aorta and its branches demonstrate atherosclerotic calcifications without aneurysmal dilatation. Heart is not significantly enlarged in size. Pulmonary artery shows a normal branching pattern. No discrete filling defect is identified to suggest pulmonary embolism. Mediastinum/Nodes: Thoracic inlet is within normal limits. Scattered small hilar and mediastinal lymph nodes are noted likely reactive in nature. The esophagus as visualized is within normal limits. Lungs/Pleura: Bilateral moderate-sized effusions are noted. Patchy ground-glass opacities are noted likely related to acute multifocal infiltrate. No sizable parenchymal nodule is seen. Upper Abdomen: Within normal limits. Musculoskeletal: Degenerative changes of the thoracic spine are noted. Review of the MIP images confirms the above findings. IMPRESSION: No evidence of pulmonary emboli. Bilateral pleural effusions of moderate size. Patchy airspace opacities consistent with multifocal pneumonia. Aortic Atherosclerosis (ICD10-I70.0). Electronically Signed   By: Inez Catalina M.D.   On: 04/07/2021 14:24   CT ABDOMEN PELVIS W CONTRAST  Result Date: 04/07/2021 CLINICAL DATA:  Unintended weight loss, iron deficiency anemia EXAM: CT ABDOMEN AND PELVIS WITH CONTRAST TECHNIQUE: Multidetector CT imaging of the abdomen and pelvis was performed using the standard protocol following bolus administration of intravenous contrast. CONTRAST:  124m OMNIPAQUE IOHEXOL 300 MG/ML  SOLN COMPARISON:  05/31/2020 FINDINGS: Lower chest: Small to moderate bilateral pleural effusions. Dependent atelectasis in the lower lobes. Heart is normal size. Hepatobiliary: No focal hepatic abnormality. Gallbladder unremarkable. Pancreas: No focal abnormality or ductal dilatation. Spleen: No focal abnormality.  Normal size. Adrenals/Urinary Tract: No adrenal abnormality. No focal renal abnormality. No stones or hydronephrosis. Urinary bladder is unremarkable.  Stomach/Bowel: Stomach, large and small bowel grossly unremarkable. Normal appendix. Vascular/Lymphatic: Mildly prominent retroperitoneal lymph nodes. Left periaortic node has a short axis diameter of 13 mm. These are similar to prior study. Mildly enlarged inguinal lymph nodes bilaterally, left greater than right. Left inguinal index node has a short axis diameter of 10 mm. Reproductive: No visible focal abnormality. Other: No free fluid or free air. Musculoskeletal: No acute bony abnormality. IMPRESSION: Small to moderate bilateral pleural effusions. Dependent atelectasis in the lower lobes. Mildly enlarged retroperitoneal lymph nodes and bilateral inguinal lymph nodes, not significantly changed since prior study. No acute findings in the abdomen or pelvis. Electronically Signed   By: KRolm BaptiseM.D.   On: 04/07/2021 23:47   CARDIAC CATHETERIZATION  Result Date: 04/09/2021 Findings: Ao =98/68 (82) LV = 81/3 RA = 1 RV = 27/2 PA = 25/8 (16) PCW = 10 Fick cardiac output/index = 5.8/3.2 PVR = 1.0 WU Ao sat = 100% PA sat = 66%, 70% Assessment: 1. Normal coronary arteries 2. 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 Low filling pressures with normal cardiac output Plan/Discussion:  Volume status low. Will give back some fluid. Stop lasix. Plan cMRI to further evaluate cardiomyopathy. Titrate GDMT as tolerated. DGlori Bickers MD 3:42 PM  DG Chest Port 1 View  Result Date: 04/07/2021 CLINICAL DATA:  60year old male with shortness of breath on exertion for 2-3 days. Dry cough. Former smoker. EXAM: PORTABLE CHEST 1 VIEW COMPARISON:  Chest radiographs 02/12/2014 and earlier. FINDINGS: Portable AP semi upright view at 1013 hours. Cardiac size at the upper limits of normal. Other mediastinal contours are within normal limits. Asymmetric patchy and indistinct opacity at the medial right lung base. Elsewhere lung markings appear within normal limits. A degree of pulmonary hyperinflation is suspected. No pneumothorax or pleural  effusion. Visualized tracheal air column is within normal limits. Chronic appearing proximal right humerus deformity, was acute in August last year. Chronic right lateral 6th rib  deformity. No acute osseous abnormality identified. IMPRESSION: 1. Patchy and indistinct right lung base opacity is nonspecific, but suspicious for bronchopneumonia in this clinical setting. No pleural effusion. If there are signs/symptoms of infection then followup PA and lateral chest X-ray is recommended in 3-4 weeks following trial of antibiotic therapy to ensure resolution and exclude underlying malignancy. Otherwise recommend Chest CT (IV contrast preferred) to further characterize. 2. Evolution of proximal right humerus fracture since August. Electronically Signed   By: Genevie Ann M.D.   On: 04/07/2021 10:34   MR CARDIAC MORPHOLOGY W WO CONTRAST  Result Date: 04/10/2021 CLINICAL DATA:  Cardiomyopathy evaluation EXAM: CARDIAC MRI TECHNIQUE: The patient was scanned on a 1.5 Tesla Siemens magnet. A dedicated cardiac coil was used. Functional imaging was done using Fiesta sequences. 2,3, and 4 chamber views were done to assess for RWMA's. Modified Simpson's rule using a short axis stack was used to calculate an ejection fraction on a dedicated work Conservation officer, nature. The patient received 10 cc of Gadavist. After 10 minutes inversion recovery sequences were used to assess for infiltration and scar tissue. CONTRAST:  10 cc  of Gadavist FINDINGS: Left ventricle: -Mild dilatation -Severe systolic dysfunction -Mild nonspecific ECV elevation (30%) -Basal septal midwall LGE LV EF: 25% (Normal 56-78%) Absolute volumes: LV EDV: 22m (Normal 77-195 mL) LV ESV: 153m(Normal 19-72 mL) LV SV: 4934mNormal 51-133 mL) CO: 4.3L/min (Normal 2.8-8.8 L/min) Indexed volumes: LV EDV: 111m109m-m (Normal 47-92 mL/sq-m) LV ESV: 84mL13mm (Normal 13-30 mL/sq-m) LV SV: 27mL/47m (Normal 32-62 mL/sq-m) CI: 2.4L/min/sq-m (Normal 1.7-4.2 L/min/sq-m)  Right ventricle: Normal size and systolic function RV EF:  49% (Normal 47-74%) Absolute volumes: RV EDV: 108mL (49mal 88-227 mL) RV ESV: 55mL (N83ml 23-103 mL) RV SV: 52mL (No61m 52-138 mL) CO: 4.5L/min (Normal 2.8-8.8 L/min) Indexed volumes: RV EDV: 59mL/sq-m3mrmal 55-105 mL/sq-m) RV ESV: 30mL/sq-m 67mmal 15-43 mL/sq-m) RV SV: 29mL/sq-m (62mal 32-64 mL/sq-m) CI: 2.5L/min/sq-m (Normal 1.7-4.2 L/min/sq-m) Left atrium: Mild enlargement Right atrium: Normal size Mitral valve: Mild regurgitation (regurgitant fraction 18%) Aortic valve: Trivial regurgitation Tricuspid valve: Mild regurgitation Pulmonic valve: No regurgitation Aorta: Normal proximal ascending aorta Pericardium: Normal IMPRESSION: 1.  Mild LV dilatation with severe systolic dysfunction (EF 25%) 2.  Nor93% RV size and systolic function (EF 49%) 3. Basa23%eptal midwall late gadolinium enhancement, which is a nonspecific scar pattern seen in nonischemic cardiomyopathies and associated with a worse prognosis 4.  Mild mitral regurgitation (regurgitant fraction 18%) Electronically Signed   By: Christopher Oswaldo Milian/10/2020 22:38   ECHOCARDIOGRAM COMPLETE  Result Date: 04/07/2021    ECHOCARDIOGRAM REPORT   Patient Name:   Mathayus D WELLCLARK CUFFm: 04/07/2021 Medical Rec #:  [id number]   [id number]       68.0 in Accession #:    [id number]  [id number]      154.0 lb Date of Birth:  Feb 08, 1961   06/23/61       1.829 m Patient Age:    60 years    8BP:           114/71 mmHg Patient Gender: M             HR:           99 bpm. Exam Location:  Inpatient Procedure: 2D Echo Indications:    dyspnea  History:        Patient has prior history of Echocardiogram examinations, most  recent 09/29/2009. Signs/Symptoms:leg swelling.  Sonographer:    Johny Chess Referring Phys: [facility]  1. Left ventricular ejection fraction, by estimation, is 20 to 25%. The left ventricle has severely decreased function. The left  ventricle demonstrates global hypokinesis. The left ventricular internal cavity size was moderately dilated. Left ventricular diastolic parameters are consistent with Grade III diastolic dysfunction (restrictive). Elevated left atrial pressure.  2. Right ventricular systolic function is normal. The right ventricular size is normal. Tricuspid regurgitation signal is inadequate for assessing PA pressure.  3. Left atrial size was severely dilated.  4. The mitral valve is normal in structure. Moderate to severe mitral valve regurgitation.  5. The aortic valve is tricuspid. Aortic valve regurgitation is trivial. No aortic stenosis is present.  6. The inferior vena cava is dilated in size with >50% respiratory variability, suggesting right atrial pressure of 8 mmHg. Comparison(s): Prior images unable to be directly viewed, comparison made by report only. FINDINGS  Left Ventricle: Left ventricular ejection fraction, by estimation, is 20 to 25%. The left ventricle has severely decreased function. The left ventricle demonstrates global hypokinesis. The left ventricular internal cavity size was moderately dilated. There is no left ventricular hypertrophy. Left ventricular diastolic parameters are consistent with Grade III diastolic dysfunction (restrictive). Elevated left atrial pressure. Right Ventricle: The right ventricular size is normal. No increase in right ventricular wall thickness. Right ventricular systolic function is normal. Tricuspid regurgitation signal is inadequate for assessing PA pressure. Left Atrium: Left atrial size was severely dilated. Right Atrium: Right atrial size was normal in size. Pericardium: There is no evidence of pericardial effusion. Mitral Valve: The mitral valve is normal in structure. Moderate to severe mitral valve regurgitation, with centrally-directed jet. Tricuspid Valve: The tricuspid valve is normal in structure. Tricuspid valve regurgitation is not demonstrated. Aortic Valve: The  aortic valve is tricuspid. Aortic valve regurgitation is trivial. No aortic stenosis is present. Pulmonic Valve: The pulmonic valve was grossly normal. Pulmonic valve regurgitation is not visualized. Aorta: The aortic root and ascending aorta are structurally normal, with no evidence of dilitation. Venous: The inferior vena cava is dilated in size with greater than 50% respiratory variability, suggesting right atrial pressure of 8 mmHg. IAS/Shunts: There is right bowing of the interatrial septum, suggestive of elevated left atrial pressure. No atrial level shunt detected by color flow Doppler.  LEFT VENTRICLE PLAX 2D LVIDd:         6.30 cm      Diastology LVIDs:         5.60 cm      LV e' medial:    8.70 cm/s LV PW:         1.10 cm      LV E/e' medial:  12.9 LV IVS:        1.00 cm      LV e' lateral:   5.55 cm/s LVOT diam:     2.10 cm      LV E/e' lateral: 20.2 LV SV:         47 LV SV Index:   26 LVOT Area:     3.46 cm  LV Volumes (MOD) LV vol d, MOD A2C: 180.0 ml LV vol d, MOD A4C: 205.0 ml LV vol s, MOD A2C: 129.0 ml LV vol s, MOD A4C: 145.0 ml LV SV MOD A2C:     51.0 ml LV SV MOD A4C:     205.0 ml LV SV MOD BP:      58.2 ml  RIGHT VENTRICLE             IVC RV S prime:     12.60 cm/s  IVC diam: 2.10 cm TAPSE (M-mode): 1.4 cm LEFT ATRIUM              Index       RIGHT ATRIUM           Index LA diam:        4.60 cm  2.52 cm/m  RA Area:     14.80 cm LA Vol (A2C):   124.0 ml 67.80 ml/m RA Volume:   36.60 ml  20.01 ml/m LA Vol (A4C):   85.9 ml  46.97 ml/m LA Biplane Vol: 103.0 ml 56.32 ml/m  AORTIC VALVE LVOT Vmax:   84.40 cm/s LVOT Vmean:  56.100 cm/s LVOT VTI:    0.136 m  AORTA Ao Root diam: 3.00 cm Ao Asc diam:  3.50 cm MITRAL VALVE MV Area (PHT): 5.31 cm      SHUNTS MV Decel Time: 143 msec      Systemic VTI:  0.14 m MR Peak grad:    108.6 mmHg  Systemic Diam: 2.10 cm MR Mean grad:    67.0 mmHg MR Vmax:         521.00 cm/s MR Vmean:        383.0 cm/s MR PISA:         1.01 cm MR PISA Eff ROA: 8 mm MR PISA  Radius:  0.40 cm MV E velocity: 112.00 cm/s MV A velocity: 82.30 cm/s MV E/A ratio:  1.36 Mihai Croitoru MD Electronically signed by Sanda Klein MD Signature Date/Time: 04/07/2021/6:34:38 PM    Final     ASSESSMENT & PLAN Connor Solomon 60 y.o. male with medical history significant for CHF, HTN, and arthritis who presents for evaluation of an IgM monoclonal gammopathy.   After review of the labs, review of the records, and discussion with the patient the patients findings are most consistent with an IgM monoclonal myopathy.  There is a significant increase in M protein and kappa light chains.  This raises concern for multiple myeloma versus Waldenstrom's macroglobulinemia.  Given these findings he will require a bone marrow biopsy.  Additionally we will order a metastatic survey to assess for lytic lesions today.  The patient already had an SPEP and serum free light chains so to complete the work-up we will order a UPEP and beta-2 microglobulin.  # IgM Monoclonal Gammopathy --today will order a UPEP and beta 2 microglobulin today --additionally will collect new baseline CBC, CMP, and LDH --recommend a metastatic bone survey to assess for lytic lesions --will order a bone marrow biopsy today --Differential includes multiple myeloma, MGUS, or Waldenstrom's macroglobulinemia. -- Plan for return to clinic following the results of the bone marrow biopsy.  #Iron Deficiency Anemia, Unclear Etiology -- Iron studies on 04/08/2021 showed an iron sat of 8%, ferritin of 160, and an iron of 21.  Continue p.o. ferrous sulfate as prescribed on 03/27/2021 --Anemia is potentially driven by neoplasm of the bone marrow.  Will review with bone marrow biopsy. --Continue to monitor.   Orders Placed This Encounter  Procedures   DG Bone Survey Met    Standing Status:   Future    Standing Expiration Date:   05/06/2022    Order Specific Question:   Reason for Exam (SYMPTOM  OR DIAGNOSIS REQUIRED)    Answer:   MGUS,  assess for lytic lesions    Order  Specific Question:   Preferred imaging location?    Answer:   [facility]   CT Biopsy    Standing Status:   Future    Standing Expiration Date:   05/06/2022    Order Specific Question:   Lab orders requested (DO NOT place separate lab orders, these will be automatically ordered during procedure specimen collection):    Answer:   Surgical Pathology    Order Specific Question:   Reason for Exam (SYMPTOM  OR DIAGNOSIS REQUIRED)    Answer:   bone marrow biopsy, concern for Waldenstrom's vs Multiple myeloma    Order Specific Question:   Preferred location?    Answer:   [facility]   CT BONE MARROW BIOPSY & ASPIRATION    Standing Status:   Future    Standing Expiration Date:   05/06/2022    Order Specific Question:   Reason for Exam (SYMPTOM  OR DIAGNOSIS REQUIRED)    Answer:   bone marrow biopsy, concern for Waldenstrom's vs Multiple myeloma    Order Specific Question:   Preferred location?    Answer:   [facility]   CBC with Differential (Cancer Center Only)    Standing Status:   Future    Number of Occurrences:   1    Standing Expiration Date:   05/06/2022   CMP ([place] only)    Standing Status:   Future    Number of Occurrences:   1    Standing Expiration Date:   05/06/2022   Lactate dehydrogenase (LDH)    Standing Status:   Future    Number of Occurrences:   1    Standing Expiration Date:   05/06/2022   24-Hr Ur UPEP/UIFE/Light Chains/TP    Standing Status:   Future    Standing Expiration Date:   05/06/2022   Beta 2 microglobulin    Standing Status:   Future    Number of Occurrences:   1    Standing Expiration Date:   05/06/2022   Uric acid    Standing Status:   Future    Number of Occurrences:   1    Standing Expiration Date:   05/06/2022    All questions were answered. The patient knows to call the clinic with any problems, questions or concerns.  A total of more than 60 minutes were spent on this encounter  with face-to-face time and non-face-to-face time, including preparing to see the patient, ordering tests and/or medications, counseling the patient and coordination of care as outlined above.   Ledell Peoples, MD Department of Hematology/Oncology [place] [facility] Phone: [phone] Pager: [phone] Email: [email or web address]  05/06/2021 5:00 PM

## 2021-05-07 LAB — BETA 2 MICROGLOBULIN, SERUM: Beta-2 Microglobulin: 3.1 mg/L — ABNORMAL HIGH (ref 0.6–2.4)

## 2021-05-07 NOTE — Progress Notes (Signed)
Advanced Heart Failure Team Clinic Note PCP: Juluis Mire HF Cardiologist: Dr. Haroldine Laws  HPI: Connor Solomon is a 60 y.o. WM w/ h/o HTN, new IDA and new systolic HF.   Pt had stress echo in 2010 for CP showing no stress arrhythmias or conduction abnormalities. The stress ECG was negative for ischemia. There was no echocardiographic evidence for stress-induced ischemia. EF normal. In 2011, he had syncope and evaluated by Dr. Harrington Challenger but this was felt to be vasovagal syncope. No further cardiac w/u.    Tested + for COVID 5/21. Did not require hospitalization but hasn't felt well since. Not vaccinated.   For the last 6 months he has noted marked fatigue + 20 lb unintentional wt loss and some occasional night sweats.   He presented to ED 04/07/21 where he was found to be in acute CHF. CXR suspicious for bronchopneumonia. Also could not r/o underlying malignancy. CT + for moderate bilateral pleural effusions and patchy airspace opacities consistent with multifocal pneumonia. He was found to be anemic w/ hgb 8.6.   Admitted by IM and started on IV Lasix. Echo showed severely reduced LVEF, 20-25% w/ glogal HK, Grade III DD (restrictive), RV normal. Mod-severe MR. No LVH. 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 ESR 142, autoimmune workup ensued. cMRI consistent with NICM, EF 25%.  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 Discharged on spiro, carvedilol and losartan (bp too soft for Entresto).  Referred to Dr. Lorenso Courier with Heme/Onc. Bone marrow bx & metastatic bone survey planned for next week.   Today he returns for HF follow up. At hospital follow up visit, BP too soft for Entresto. Overall feels breathing is about the same, SOB with minimal activity. Wife thinks he seems worse. Struggling with fatigue and body aches. Has right knee pain that keeps him up at night, asking for pain medication.  Denies increasing CP, dizziness, edema, or PND/Orthopnea. Appetite poor. No fever or chills. Weight at home 151 pounds. Taking all medications. SBPs 90s at home recently.  Cardiac Studies:   - R/LHC (6/22): Normal cors, severe MICM EF 25%, low filling pressures with normal CO.   Ao =98/68 (82) LV = 81/3 RA = 1 RV = 27/2 PA = 25/8 (16) PCW = 10 Fick cardiac output/index = 5.8/3.2 PVR = 1.0 WU Ao sat = 100% PA sat = 66%, 70%   - Echo (6/22): EF 20-25%, Grade III DD, RV ok, mod/severe MR  ROS: All systems negative except as listed in HPI, PMH and Problem List.  SH:  Social History   Socioeconomic History   Marital status: Single    Spouse name: Not on file   Number of children: Not on file   Years of education: Not on file   Highest education level: Not on file  Occupational History   Not on file  Tobacco Use   Smoking status: Former    Types: Cigars    Quit date: 10/11/2020    Years since quitting: 0.5   Smokeless tobacco: Never   Tobacco comments:    smokes 1 cigar a week  Substance and Sexual Activity   Alcohol use: No   Drug use: No   Sexual activity: Not on file  Other Topics Concern   Not on file  Social History Narrative   Not on file   Social Determinants of Health   Financial Resource Strain: High Risk   Difficulty of Paying Living  Expenses: Hard  Food Insecurity: Landscape architect Present   Worried About Charity fundraiser in the Last Year: Sometimes true   Arboriculturist in the Last Year: Sometimes true  Transportation Needs: No Transportation Needs   Lack of Transportation (Medical): No   Lack of Transportation (Non-Medical): No  Physical Activity: Not on file  Stress: Not on file  Social Connections: Not on file  Intimate Partner Violence: Not on file   FH:  Family History  Problem Relation Age of Onset   Heart attack Father    Sudden Cardiac Death Father 52   Diabetes Neg Hx    Hyperlipidemia Neg Hx    Hypertension Neg Hx     Past  Medical History:  Diagnosis Date   Acute heart failure ([place]) 04/08/2021   Acute HFrEF (heart failure with reduced ejection fraction) ([place]) 04/07/2021   Arthritis    hands, elbows bilaterally   Full dentures    Hypertension    Pt states he doen not have HTN and has never been treated for HTN   Kidney stone on left side last stone 4-5 yrs ago   recurrent (3 episodes)   Panic disorder    pt states has resolved   Syncope    resolved- was related to panic attacks    Current Outpatient Medications  Medication Sig Dispense Refill   acetaminophen (TYLENOL) 500 MG tablet Take 500 mg by mouth every 6 (six) hours as needed.     albuterol (VENTOLIN HFA) 108 (90 Base) MCG/ACT inhaler Inhale 2 puffs into the lungs every 6 (six) hours as needed for wheezing or shortness of breath. 8 g 2   carvedilol (COREG) 3.125 MG tablet Take 1 tablet (3.125 mg total) by mouth 2 (two) times daily. 68 tablet 2   ferrous sulfate 300 (60 Fe) MG/5ML syrup Take 5 mLs (300 mg total) by mouth daily. 150 mL 3   losartan (COZAAR) 25 MG tablet Take 0.5 tablets (12.5 mg total) by mouth at bedtime. 17 tablet 2   spironolactone (ALDACTONE) 25 MG tablet Take 1 tablet (25 mg total) by mouth daily. 34 tablet 2   furosemide (LASIX) 20 MG tablet Take 1 tablet (20 mg total) by mouth as needed for swelling. (Patient not taking: No sig reported) 100 tablet 2   No current facility-administered medications for this encounter.   BP 112/68   Pulse 70   Ht $R'5\' 8"'zL$  (1.727 m)   Wt 68 kg (150 lb)   SpO2 98%   BMI 22.81 kg/m   Wt Readings from Last 3 Encounters:  05/08/21 68 kg (150 lb)  05/06/21 68.1 kg (150 lb 3.2 oz)  04/20/21 70.8 kg (156 lb 1.6 oz)   PHYSICAL EXAM: General:  NAD. No resp difficulty, thin HEENT: Normal Neck: Supple. No JVD. Carotids 2+ bilat; no bruits. No lymphadenopathy or thryomegaly appreciated. Cor: PMI nondisplaced. Regular rate & rhythm. No rubs, gallops or murmurs. Lungs: Clear Abdomen: Soft, nontender,  nondistended. No hepatosplenomegaly. No bruits or masses. Good bowel sounds. Extremities: No cyanosis, clubbing, rash, edema Neuro: Alert & oriented x 3, cranial nerves grossly intact. Moves all 4 extremities w/o difficulty. Affect pleasant.  ASSESSMENT & PLAN:  1. Chronic Systolic Heart Failure - Stress Echo 2010: normal EF. No evidence of ischemia - Echo 6/22: EF 20-25%, GIIDD (restrictive), no LVH. RV normal - ? Viral CM (COVID + 2021), ? Familia (FH of SCD). TSH normal, HIV pending - LHC/RHC (6/22): with normal cors, low filling  pressures, and normal cardiac output - Also ? Infiltrative CM. cMRI EF 25%, normal RV, Basal septal midwall LGE, mild MR.  - MM panel urine immunofixation: IgM monoclonal protein with kappa light chain  specificity. - NYHA IIIb. Volume status stable on exam and by weight. Does not need scheduled diuretics. - Decrease spiro to 12.5 mg daily w/ low BPs.  - Continue losartan 12.5 q hs (hold off on Entresto w/ soft BP). - SGLT2i soon (Hgb A1c 5.6).  - BMET today.  2.  Mitral Regurgitation - Mod-severe, likely functional from dilated LV.   3.  Iron Deficiency Anemia - Per PCP. - Recent Hgb 9.5.        4.  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 CT C/A/P unremarkable - Now followed by Dr. Lorenso Courier with Heme/Onc - ? multiple myeloma or Waldenstrom's macroglobulinemia. - Bone marrow bx and metastatic bone survey scheduled for next week.  5. SDOH - HFSW assisting with hospital bills and insurance.  6. Right knee pain - Continue acetaminophen. - He declined tramadol, says it does not help. - Follow up with PCP.    Follow up in 3-4 weeks for further medication titration. Anticipate adding SGLT2i. Consider timing of repeat echo with findings from bone marrow biopsy and bone survey.  Allena Katz, FNP-BC 05/08/21

## 2021-05-08 ENCOUNTER — Encounter (HOSPITAL_BASED_OUTPATIENT_CLINIC_OR_DEPARTMENT_OTHER): Payer: Self-pay

## 2021-05-08 ENCOUNTER — Ambulatory Visit: Payer: Self-pay | Admitting: Internal Medicine

## 2021-05-08 ENCOUNTER — Other Ambulatory Visit: Payer: Self-pay

## 2021-05-08 ENCOUNTER — Other Ambulatory Visit (HOSPITAL_COMMUNITY): Payer: Self-pay

## 2021-05-08 ENCOUNTER — Ambulatory Visit (HOSPITAL_COMMUNITY)
Admission: RE | Admit: 2021-05-08 | Discharge: 2021-05-08 | Disposition: A | Discharge status: Home / Self Care | Payer: Medicaid Other | Source: Ambulatory Visit | Attending: Family Medicine | Admitting: Family Medicine

## 2021-05-08 ENCOUNTER — Encounter (HOSPITAL_COMMUNITY): Payer: Self-pay

## 2021-05-08 VITALS — BP 112/68 | HR 70 | Ht 68.0 in | Wt 150.0 lb

## 2021-05-08 DIAGNOSIS — G8929 Other chronic pain: Secondary | ICD-10-CM

## 2021-05-08 DIAGNOSIS — R5383 Other fatigue: Secondary | ICD-10-CM | POA: Insufficient documentation

## 2021-05-08 DIAGNOSIS — Z79899 Other long term (current) drug therapy: Secondary | ICD-10-CM | POA: Insufficient documentation

## 2021-05-08 DIAGNOSIS — I340 Nonrheumatic mitral (valve) insufficiency: Secondary | ICD-10-CM

## 2021-05-08 DIAGNOSIS — I110 Hypertensive heart disease with heart failure: Secondary | ICD-10-CM | POA: Insufficient documentation

## 2021-05-08 DIAGNOSIS — I509 Heart failure, unspecified: Secondary | ICD-10-CM | POA: Diagnosis not present

## 2021-05-08 DIAGNOSIS — Z8249 Family history of ischemic heart disease and other diseases of the circulatory system: Secondary | ICD-10-CM | POA: Insufficient documentation

## 2021-05-08 DIAGNOSIS — M25561 Pain in right knee: Secondary | ICD-10-CM | POA: Insufficient documentation

## 2021-05-08 DIAGNOSIS — Z859 Personal history of malignant neoplasm, unspecified: Secondary | ICD-10-CM | POA: Insufficient documentation

## 2021-05-08 DIAGNOSIS — D509 Iron deficiency anemia, unspecified: Secondary | ICD-10-CM

## 2021-05-08 DIAGNOSIS — Z87891 Personal history of nicotine dependence: Secondary | ICD-10-CM | POA: Diagnosis not present

## 2021-05-08 DIAGNOSIS — I5022 Chronic systolic (congestive) heart failure: Secondary | ICD-10-CM | POA: Diagnosis not present

## 2021-05-08 DIAGNOSIS — D472 Monoclonal gammopathy: Secondary | ICD-10-CM | POA: Diagnosis not present

## 2021-05-08 DIAGNOSIS — Z7901 Long term (current) use of anticoagulants: Secondary | ICD-10-CM | POA: Insufficient documentation

## 2021-05-08 DIAGNOSIS — M898X0 Other specified disorders of bone, multiple sites: Secondary | ICD-10-CM | POA: Diagnosis not present

## 2021-05-08 DIAGNOSIS — I5020 Unspecified systolic (congestive) heart failure: Secondary | ICD-10-CM

## 2021-05-08 LAB — BASIC METABOLIC PANEL
Anion gap: 10 (ref 5–15)
BUN: 9 mg/dL (ref 6–20)
CO2: 22 mmol/L (ref 22–32)
Calcium: 9 mg/dL (ref 8.9–10.3)
Chloride: 101 mmol/L (ref 98–111)
Creatinine, Ser: 0.82 mg/dL (ref 0.61–1.24)
GFR, Estimated: >60 mL/min (ref >60)
Glucose, Bld: 98 mg/dL (ref 70–99)
Potassium: 4.2 mmol/L (ref 3.5–5.1)
Sodium: 133 mmol/L — ABNORMAL LOW (ref 135–145)

## 2021-05-08 MED ORDER — SPIRONOLACTONE 25 MG PO TABS: 12.5000 mg | ORAL_TABLET | Freq: Every day | ORAL | 3 refills | Status: DC

## 2021-05-08 MED ORDER — CARVEDILOL 3.125 MG PO TABS
3.1250 mg | ORAL_TABLET | Freq: Two times a day (BID) | ORAL | 2 refills | Status: DC
  Filled 2021-05-08: qty 68, 34d supply, fill #0

## 2021-05-08 MED ORDER — FUROSEMIDE 20 MG PO TABS
20.0000 mg | ORAL_TABLET | Freq: Every day | ORAL | 2 refills | Status: DC | PRN
  Filled 2021-05-08: qty 100, 34d supply, fill #0
  Filled 2021-07-16 – 2021-07-22 (×2): qty 100, 34d supply, fill #1

## 2021-05-08 MED ORDER — SPIRONOLACTONE 25 MG PO TABS
25.0000 mg | ORAL_TABLET | Freq: Every day | ORAL | 2 refills | Status: DC
  Filled 2021-05-08: qty 34, 34d supply, fill #0

## 2021-05-08 MED ORDER — LOSARTAN POTASSIUM 25 MG PO TABS
12.5000 mg | ORAL_TABLET | Freq: Every day | ORAL | 2 refills | Status: DC
  Filled 2021-05-08: qty 17, 34d supply, fill #0

## 2021-05-08 NOTE — Patient Instructions (Signed)
Labs done today. We will contact you only if your labs are abnormal.  DECREASE Spironolactone to 12.5mg  (1/2 tablet) by mouth daily.  No other medication changes were made. Please continue all current medications as prescribed.  Your physician recommends that you schedule a follow-up appointment in: 4-6 weeks   If you have any questions or concerns before your next appointment please send [place] a message through [place] or call our office at [phone].    TO LEAVE A MESSAGE FOR THE NURSE SELECT OPTION 2, PLEASE LEAVE A MESSAGE INCLUDING: YOUR NAME DATE OF BIRTH CALL BACK NUMBER REASON FOR CALL**this is important as we prioritize the call backs  YOU WILL RECEIVE A CALL BACK THE SAME DAY AS LONG AS YOU CALL BEFORE 4:00 PM   Do the following things EVERYDAY: Weigh yourself in the morning before breakfast. Write it down and keep it in a log. Take your medicines as prescribed Eat low salt foods--Limit salt (sodium) to 2000 mg per day.  Stay as active as you can everyday Limit all fluids for the day to less than 2 liters   At the [facility], you and your health needs are our priority. As part of our continuing mission to provide you with exceptional heart care, we have created designated Provider Care Teams. These Care Teams include your primary Cardiologist (physician) and Advanced Practice Providers (APPs- Physician Assistants and Nurse Practitioners) who all work together to provide you with the care you need, when you need it.   You may see any of the following providers on your designated Care Team at your next follow up: Dr Glori Bickers Dr Haynes Kerns, NP Lyda Jester, [place] Audry Riles, PharmD   Please be sure to bring in all your medications bottles to every appointment.

## 2021-05-08 NOTE — ED Triage Notes (Signed)
Pt is present for "pain all over" but mainly right knee and shin. Pt has recently been diagnosed with bone cancer and they have not given him any Rx for pain meds yet. Pt is present for pain meds and would like a Rx until he can see his Oncologist on Monday.

## 2021-05-08 NOTE — Telephone Encounter (Signed)
Reason for Disposition  [1] MODERATE pain (e.g., interferes with normal activities, limping) AND [2] present > 3 days  Answer Assessment - Initial Assessment Questions 1. LOCATION and RADIATION: "Where is the pain located?"      Knees and shins 2. QUALITY: "What does the pain feel like?"  (e.g., sharp, dull, aching, burning)     Dull ache 3. SEVERITY: "How bad is the pain?" "What does it keep you from doing?"   (Scale 1-10; or mild, moderate, severe)   -  MILD (1-3): doesn't interfere with normal activities    -  MODERATE (4-7): interferes with normal activities (e.g., work or school) or awakens from sleep, limping    -  SEVERE (8-10): excruciating pain, unable to do any normal activities, unable to walk     10 when it hits 4. ONSET: "When did the pain start?" "Does it come and go, or is it there all the time?"     Quick onset and does not last long but repeats 5. RECURRENT: "Have you had this pain before?" If Yes, ask: "When, and what happened then?"     Has new dx which they are looking for bone lesions 6. SETTING: "Has there been any recent work, exercise or other activity that involved that part of the body?"      New diagnosis involving bone marrow order 7. AGGRAVATING FACTORS: "What makes the knee pain worse?" (e.g., walking, climbing stairs, running)     nothing 8. ASSOCIATED SYMPTOMS: "Is there any swelling or redness of the knee?"     Knee does swell, not big 9. OTHER SYMPTOMS: "Do you have any other symptoms?" (e.g., chest pain, difficulty breathing, fever, calf pain)     no 10. PREGNANCY: "Is there any chance you are pregnant?" "When was your last menstrual period?"       N/a  Protocols used: Knee Pain-A-AH

## 2021-05-08 NOTE — Telephone Encounter (Signed)
Pt has metastatic lesions on bones per records, thought he was seeing the correct doctor today for that but he actually saw [facility]. His oncology appt was Wed. He is going to alternate the Tylenol and Ibuprofen he has. He states he has ultram but not helpful. He is going to alternate ice and heat. He was originally going to call oncology office to ask for different pain med to be called in tonight. He and his wife have decided to go to the new [facility] on [address]. Directions given as well as our after hours number.

## 2021-05-08 NOTE — Addendum Note (Signed)
Encounter addended by: Rafael Bihari, FNP on: 05/08/2021 3:19 PM  Actions taken: Clinical Note Signed

## 2021-05-08 NOTE — ED Notes (Addendum)
Pt would like to wait in vehicle. Please call when they have room

## 2021-05-09 ENCOUNTER — Emergency Department (HOSPITAL_BASED_OUTPATIENT_CLINIC_OR_DEPARTMENT_OTHER)
Admission: EM | Admit: 2021-05-09 | Discharge: 2021-05-09 | Disposition: A | Discharge status: Home / Self Care | Payer: Medicaid Other | Attending: Emergency Medicine | Admitting: Emergency Medicine

## 2021-05-09 DIAGNOSIS — M898X9 Other specified disorders of bone, unspecified site: Secondary | ICD-10-CM

## 2021-05-09 HISTORY — DX: Malignant (primary) neoplasm, unspecified: C80.1

## 2021-05-09 MED ORDER — OXYCODONE-ACETAMINOPHEN 5-325 MG PO TABS: 1.0000 | ORAL_TABLET | Freq: Three times a day (TID) | ORAL | 0 refills | Status: DC | PRN

## 2021-05-09 NOTE — ED Provider Notes (Signed)
[facility] EMERGENCY DEPT Provider Note   CSN: [id number] Arrival date & time: 05/08/21  2201     History Chief Complaint  Patient presents with   Generalized Body Aches   Cancer    Connor Solomon is a 60 y.o. male.  The history is provided by the patient.     Patient with a history of heart failure, IgM monoclonal gammopathy presents with diffuse bone pain.  He reports all of his arms and legs are painful.  No joint swelling no fevers or vomiting.  He reports he has difficulty managing the pain at home.  He plans to follow-up early next week for reassessment and continued pain managed  he is requesting pain meds here Past Medical History:  Diagnosis Date   Acute heart failure (HCC) 04/08/2021   Acute HFrEF (heart failure with reduced ejection fraction) (HCC) 04/07/2021   Arthritis    hands, elbows bilaterally   Cancer (H[facility])    Full dentures    Hypertension    Pt states he doen not have HTN and has never been treated for HTN   Kidney stone on left side last stone 4-5 yrs ago   recurrent (3 episodes)   Panic disorder    pt states has resolved   Syncope    resolved- was related to panic attacks    Patient Active Problem List   Diagnosis Date Noted   Tinnitus aurium, 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   HFrEF (heart failure with reduced ejection fraction) (HCC) 04/20/2021   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   Pleuritic pain 04/20/2021   Weight loss, unintentional 04/08/2021   Microcytic anemia 04/08/2021   Mitral regurgitation 04/08/2021   Benign essential hypertension 09/22/2009    Past Surgical History:  Procedure Laterality Date   CARPAL METACARPAL FUSION WITH DISTAL RADIAL BONE GRAFT Left 05/30/2013   Procedure: LEFT THUMB METACARPAL JOINT FUSION;  Surgeon: [name], MD;  Location: [facility];  Service: Orthopedics;  Laterality: Left;   ESOPHAGOGASTRODUODENOSCOPY N/A 02/12/2014   Procedure:  ESOPHAGOGASTRODUODENOSCOPY (EGD);  Surgeon: [name], MD;  Location: [facility] ENDOSCOPY;  Service: Endoscopy;  Laterality: N/A;   FOREIGN BODY REMOVAL N/A 02/12/2014   Procedure: FOREIGN BODY REMOVAL;  Surgeon: [name], MD;  Location: [facility] ENDOSCOPY;  Service: Endoscopy;  Laterality: N/A;   OPEN REDUCTION INTERNAL FIXATION (ORIF) DISTAL RADIAL FRACTURE Left 11/29/2012   Procedure: OPEN REDUCTION INTERNAL FIXATION (ORIF) DISTAL RADIAL FRACTURE;  Surgeon: [name], MD;  Location: [facility];  Service: Orthopedics;  Laterality: Left;  LEFT DISTAL RADIUS OSTEOTOMY WITH BONE GRAFT   RIGHT/LEFT HEART CATH AND CORONARY ANGIOGRAPHY N/A 04/09/2021   Procedure: RIGHT/LEFT HEART CATH AND CORONARY ANGIOGRAPHY;  Surgeon: [name], MD;  Location: [facility] INVASIVE CV [facility];  Service: Cardiovascular;  Laterality: N/A;   TENDON REPAIR Left 02/07/2013   Procedure: LEFT EXTENSOR INDICIS PROPRIUS  TO EXTENSOR POLLICIS LONGUS TENDON TRANSFER;  Surgeon: [name], MD;  Location: [facility];  Service: Orthopedics;  Laterality: Left;   WISDOM TOOTH EXTRACTION     WRIST SURGERY     fx only       Family History  Problem Relation Age of Onset   Heart attack Father    Sudden Cardiac Death Father 54   Diabetes Neg Hx    Hyperlipidemia Neg Hx    Hypertension Neg Hx     Social History   Tobacco Use   Smoking status: Former  Types: Cigars    Quit date: 10/11/2020    Years since quitting: 0.5   Smokeless tobacco: Never   Tobacco comments:    smokes 1 cigar a week  Substance Use Topics   Alcohol use: No   Drug use: No    Home Medications Prior to Admission medications   Medication Sig Start Date End Date Taking? Authorizing Provider  oxyCODONE-acetaminophen (PERCOCET) 5-325 MG tablet Take 1 tablet by mouth every 8 (eight) hours as needed. 05/09/21  Yes Ripley Fraise, MD  acetaminophen (TYLENOL) 500 MG tablet Take 500 mg by mouth every 6 (six) hours as  needed.    [provider]  albuterol (VENTOLIN HFA) 108 (90 Base) MCG/ACT inhaler Inhale 2 puffs into the lungs every 6 (six) hours as needed for wheezing or shortness of breath. 03/11/21   Icard, Octavio Graves, DO  carvedilol (COREG) 3.125 MG tablet Take 1 tablet (3.125 mg total) by mouth 2 (two) times daily. 04/17/21   Milford, Maricela Bo, FNP  carvedilol (COREG) 3.125 MG tablet Take 1 tablet (3.125 mg total) by mouth 2 (two) times daily. 05/08/21     ferrous sulfate 300 (60 Fe) MG/5ML syrup Take 5 mLs (300 mg total) by mouth daily. 03/27/21   [name], NP  furosemide (LASIX) 20 MG tablet Take 1 tablet (20 mg total) by mouth as needed for swelling. Patient not taking: No sig reported 04/17/21   Rafael Bihari, FNP  furosemide (LASIX) 20 MG tablet Take 1 tablet (20 mg total) by mouth daily as needed for swelling 05/08/21     losartan (COZAAR) 25 MG tablet Take 0.5 tablets (12.5 mg total) by mouth at bedtime. 04/17/21   Milford, Maricela Bo, FNP  losartan (COZAAR) 25 MG tablet Take 0.5 tablets (12.5 mg total) by mouth at bedtime. 05/08/21     spironolactone (ALDACTONE) 25 MG tablet Take 0.5 tablets (12.5 mg total) by mouth daily. 05/08/21   Rafael Bihari, FNP  spironolactone (ALDACTONE) 25 MG tablet Take 1 tablet (25 mg total) by mouth daily. 05/08/21       Allergies    Hydrocodone  Review of Systems   Review of Systems  Constitutional:  Negative for fever.  Musculoskeletal:  Positive for arthralgias and myalgias. Negative for joint swelling.   Physical Exam Updated Vital Signs BP 109/72   Pulse (!) 102   Temp 98.2 F (36.8 C) (Oral)   Resp 19   Ht 1.727 m ($Remove'5\' 8"'mkvIEuQ$ )   Wt 68 kg   SpO2 100%   BMI 22.79 kg/m   Physical Exam CONSTITUTIONAL: Elderly and frail HEAD: Normocephalic/atraumatic EYES: EOMI ENMT: Mucous membranes moist NECK: supple no meningeal signs CV: S1/S2 noted, no murmurs/rubs/gallops noted LUNGS: Lungs are clear to auscultation bilaterally, no apparent  distress ABDOMEN: soft, nontender, no rebound or guarding, bowel sounds noted throughout abdomen GU:no cva tenderness NEURO: Pt is awake/alert/appropriate, moves all extremitiesx4.  No facial droop.   EXTREMITIES: pulses normal/equal, full ROM, no joint swelling, no deformities.  No bruising.  No lower extremity edema.  No erythema SKIN: warm, color normal PSYCH: no abnormalities of mood noted, alert and oriented to situation  ED Results / Procedures / Treatments   Labs (all labs ordered are listed, but only abnormal results are displayed) Labs Reviewed - No data to display  EKG None  Radiology No results found.  Procedures Procedures   Medications Ordered in ED Medications - No data to display  ED Course  I have reviewed the triage  vital signs and the nursing notes.     MDM Rules/Calculators/A&P                           Provide short course of pain medications for outpatient management He will follow-up next week for further pain management Final Clinical Impression(s) / ED Diagnoses Final diagnoses:  Bone pain    Rx / DC Orders ED Discharge Orders          Ordered    oxyCODONE-acetaminophen (PERCOCET) 5-325 MG tablet  Every 8 hours PRN        05/09/21 0336             Ripley Fraise, MD 05/09/21 [phone]

## 2021-05-09 NOTE — ED Notes (Signed)
Pt verbalizes understanding of discharge instructions. Opportunity for questioning and answers were provided. Armand removed by staff, pt discharged from ED to home. Educated to pick up Rx.  

## 2021-05-11 ENCOUNTER — Inpatient Hospital Stay (INDEPENDENT_AMBULATORY_CARE_PROVIDER_SITE_OTHER): Payer: Self-pay | Admitting: Primary Care

## 2021-05-12 ENCOUNTER — Telehealth: Payer: Self-pay

## 2021-05-12 ENCOUNTER — Other Ambulatory Visit: Payer: Self-pay

## 2021-05-12 ENCOUNTER — Ambulatory Visit
Admission: RE | Admit: 2021-05-12 | Discharge: 2021-05-12 | Disposition: A | Discharge status: Home / Self Care | Payer: Self-pay | Source: Ambulatory Visit | Attending: Pulmonary Disease | Admitting: Pulmonary Disease

## 2021-05-12 ENCOUNTER — Other Ambulatory Visit: Payer: Self-pay | Admitting: Internal Medicine

## 2021-05-12 ENCOUNTER — Other Ambulatory Visit (HOSPITAL_COMMUNITY)
Admission: RE | Admit: 2021-05-12 | Discharge: 2021-05-12 | Disposition: A | Discharge status: Home / Self Care | Payer: Medicaid Other | Source: Ambulatory Visit | Attending: Hematology and Oncology | Admitting: Hematology and Oncology

## 2021-05-12 DIAGNOSIS — R918 Other nonspecific abnormal finding of lung field: Secondary | ICD-10-CM | POA: Diagnosis not present

## 2021-05-12 DIAGNOSIS — D472 Monoclonal gammopathy: Secondary | ICD-10-CM | POA: Insufficient documentation

## 2021-05-12 DIAGNOSIS — R911 Solitary pulmonary nodule: Secondary | ICD-10-CM | POA: Diagnosis not present

## 2021-05-12 DIAGNOSIS — I700 Atherosclerosis of aorta: Secondary | ICD-10-CM | POA: Diagnosis not present

## 2021-05-12 MED ORDER — OXYCODONE-ACETAMINOPHEN 5-325 MG PO TABS: 1.0000 | ORAL_TABLET | Freq: Three times a day (TID) | ORAL | 0 refills | Status: DC | PRN

## 2021-05-12 NOTE — Telephone Encounter (Signed)
Patient notified refill has been sent. He is very Patent attorney.

## 2021-05-12 NOTE — Telephone Encounter (Signed)
Requesting to speak with a nurse about  oxyCODONE-acetaminophen (PERCOCET) 5-325 MG tablet. Please call pt back.

## 2021-05-12 NOTE — Telephone Encounter (Signed)
Will refill for 7 day supply. Recommend discussing further treatment at upcoming appointment.

## 2021-05-12 NOTE — Progress Notes (Signed)
Patient has a history of IgM monoclonal gammopathy. Has upcoming appointment in clinic and requesting pain medication to manage pain. Will need further evaluation of this pain at visit. Pain management as below.  IgM monoclonal gammopathy of uncertain significance - oxyCODONE-acetaminophen (PERCOCET) 5-325 MG tablet; Take 1 tablet by mouth every 8 (eight) hours as needed for up to 7 days for severe pain.  Dispense: 21 tablet; Refill: 0

## 2021-05-12 NOTE — Telephone Encounter (Signed)
Returned call to patient. States he was seen in ED for bone pain mostly at right knee ans shin. He was given 10 tabs of oxycodone which "helped immensely." States no relief with Tylenol. He is unable to sleep 2/2 pain. He is requesting a refill of oxycodone. He has f/u appt at [facility] on 8/8.

## 2021-05-13 ENCOUNTER — Other Ambulatory Visit: Payer: Self-pay

## 2021-05-13 ENCOUNTER — Ambulatory Visit (HOSPITAL_COMMUNITY)
Admission: RE | Admit: 2021-05-13 | Discharge: 2021-05-13 | Disposition: A | Discharge status: Home / Self Care | Payer: Medicaid Other | Source: Ambulatory Visit | Attending: Hematology and Oncology | Admitting: Hematology and Oncology

## 2021-05-13 DIAGNOSIS — D472 Monoclonal gammopathy: Secondary | ICD-10-CM

## 2021-05-13 NOTE — Progress Notes (Signed)
You can let him know that CT Chest looks good. Nodules are stable.   But I dont see that his PFTs or Follow up after PFTs have been scheduled.   Thanks,  BLI  Garner Nash, DO [facility] 05/13/2021 10:08 AM

## 2021-05-14 ENCOUNTER — Telehealth (HOSPITAL_COMMUNITY): Payer: Self-pay | Admitting: Licensed Clinical Social Worker

## 2021-05-14 ENCOUNTER — Encounter: Payer: Self-pay | Admitting: *Deleted

## 2021-05-14 LAB — UPEP/UIFE/LIGHT CHAINS/TP, 24-HR UR
% BETA, Urine: 92 %
ALPHA 1 URINE: 0.9 %
Albumin, U: 2.6 %
Alpha 2, Urine: 2.7 %
Free Kappa Lt Chains,Ur: 7946.6 mg/L — ABNORMAL HIGH (ref 1.17–86.46)
Free Kappa/Lambda Ratio: 875.18 — ABNORMAL HIGH (ref 1.83–14.26)
Free Lambda Lt Chains,Ur: 9.08 mg/L (ref 0.27–15.21)
GAMMA GLOBULIN URINE: 1.8 %
M-SPIKE %, Urine: 87.5 % — ABNORMAL HIGH
M-Spike, Mg/24 Hr: 1366 mg/24 hr — ABNORMAL HIGH
Total Protein, Urine-Ur/day: 1561 mg/24 hr — ABNORMAL HIGH (ref 30–150)
Total Protein, Urine: 156.1 mg/dL
Total Volume: 1000

## 2021-05-14 NOTE — Telephone Encounter (Signed)
CSW received message that patient was in the clinic last week asking for assistance with a [employer] power bill. CSW contacted patient's wife and confirmed  request for account number and billing info for further assistance. Wife will return call when she has the information gathered. CSW available as needed. Raquel Sarna, [place], [place]

## 2021-05-15 ENCOUNTER — Telehealth (HOSPITAL_COMMUNITY): Payer: Self-pay | Admitting: Licensed Clinical Social Worker

## 2021-05-15 ENCOUNTER — Telehealth: Payer: Self-pay | Admitting: Hematology and Oncology

## 2021-05-15 NOTE — Telephone Encounter (Signed)
CSW received return call form patient who states he had his [place] appointment and the process has been started. He states "I have done my part and now just waiting to hear". Patient shared that he has a shut off notice with [place] and has no current income. Patient and SO are struggling financially and request some assistance with power bill. CSW will explore options through the Patient Care Fund to assist with bill. Patient will stop by clinic later today to drop off the bill. Raquel Sarna, [place], [place]

## 2021-05-15 NOTE — Telephone Encounter (Signed)
Scheduled appt per 8/5 sch msg. Called pt, no answer. Left msg with appt date and time.

## 2021-05-18 ENCOUNTER — Ambulatory Visit (INDEPENDENT_AMBULATORY_CARE_PROVIDER_SITE_OTHER): Payer: Self-pay | Admitting: Internal Medicine

## 2021-05-18 ENCOUNTER — Other Ambulatory Visit: Payer: Self-pay | Admitting: Internal Medicine

## 2021-05-18 ENCOUNTER — Encounter: Payer: Self-pay | Admitting: Internal Medicine

## 2021-05-18 VITALS — BP 109/70 | HR 93 | Temp 97.9°F | Wt 151.7 lb

## 2021-05-18 DIAGNOSIS — D472 Monoclonal gammopathy: Secondary | ICD-10-CM

## 2021-05-18 DIAGNOSIS — C9000 Multiple myeloma not having achieved remission: Secondary | ICD-10-CM

## 2021-05-18 DIAGNOSIS — I10 Essential (primary) hypertension: Secondary | ICD-10-CM

## 2021-05-18 DIAGNOSIS — I5020 Unspecified systolic (congestive) heart failure: Secondary | ICD-10-CM

## 2021-05-18 DIAGNOSIS — D509 Iron deficiency anemia, unspecified: Secondary | ICD-10-CM

## 2021-05-18 DIAGNOSIS — R21 Rash and other nonspecific skin eruption: Secondary | ICD-10-CM | POA: Insufficient documentation

## 2021-05-18 MED ORDER — OXYCODONE-ACETAMINOPHEN 5-325 MG PO TABS: 1.0000 | ORAL_TABLET | Freq: Three times a day (TID) | ORAL | Status: AC | PRN

## 2021-05-18 MED ORDER — OXYCODONE-ACETAMINOPHEN 5-325 MG PO TABS: 1.0000 | ORAL_TABLET | Freq: Three times a day (TID) | ORAL | 0 refills | Status: DC | PRN

## 2021-05-18 NOTE — Assessment & Plan Note (Signed)
Bilateral prurtitc erythematous papules on thighs. 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

## 2021-05-18 NOTE — Patient Instructions (Addendum)
Dear Connor Solomon,  Thank you for trusting me with your care.  Today, we discussed the following concerns:  Pain: This is likely due to your blood disorder. Current laboratory testing indicates a diagnosis of multiple myeloma.  - We will refill your Percocet prescription for 5 days and follow up after bone marrow biopsy. - Follow up with your oncologist at [facility] for bone marrow biopsy.  Rash: - Possibly related to blood disorder that we discussed.  - Continue to apply hydrocortisone cream for now. - Follow up in 2 weeks for biopsy of the rash.  Please call our office if your symptoms worsen or do not improve.

## 2021-05-18 NOTE — Progress Notes (Addendum)
   CC: 4 week f/u  HPI:[name] is a 60 y.o. male who presents for evaluation of 4 week follow up. Please see individual problem based A/P for details.  Reports that he is feeling tired and frustrated about poor health recently.   Endorsing generalize body pain. Deep sharp pain in elbow and knees. Pain feels like flu body aches. Reports oxycodone has helped immensely. Reports past month wakes up feeling exhausted. Medication allows him to sleep. Tylenol does not help. Continuing to endorse weight loss despite trying to eat as much as he can.   Please see encounters tab for problem based charting.  Depression, PHQ-9: Based on the patients  [place] Visit from 04/20/2021 in [place]  PHQ-9 Total Score 0      score we have 0.  Past Medical History:  Diagnosis Date   Acute heart failure ([place]) 04/08/2021   Acute HFrEF (heart failure with reduced ejection fraction) ([place]) 04/07/2021   Arthritis    hands, elbows bilaterally   Cancer ([place])    Full dentures    Hypertension    Pt states he doen not have HTN and has never been treated for HTN   Kidney stone on left side last stone 4-5 yrs ago   recurrent (3 episodes)   Panic disorder    pt states has resolved   Syncope    resolved- was related to panic attacks   Review of Systems:   Review of Systems  Constitutional:  Positive for malaise/fatigue and weight loss.  Respiratory:  Positive for cough and shortness of breath.   Cardiovascular:  Positive for orthopnea. Negative for chest pain and leg swelling.  Musculoskeletal:  Positive for joint pain and myalgias.  Skin:  Positive for rash.  Psychiatric/Behavioral:  Negative for depression and suicidal ideas.     Physical Exam: Vitals:   05/18/21 1021  BP: 109/70  Pulse: 93  Temp: 97.9 F (36.6 C)  TempSrc: Oral  SpO2: 100%  Weight: 151 lb 11.2 oz (68.8 kg)     General: alert and oriented, no acute distress HEENT: Conjunctiva nl  , antiicteric sclerae, moist mucous membranes, no exudate or erythema Cardiovascular: Normal rate, regular rhythm.  No murmurs, rubs, or gallops Pulmonary : Equal breath sounds, No wheezes, rales, or rhonchi Abdominal: soft, nontender,  bowel sounds present Ext: No edema in lower extremities, no tenderness to palpation of lower extremities. Skin: Pruritic erythematous papules on bilateral thighs. Excoriations noted.   Assessment & Plan:   See Encounters Tab for problem based charting.  Patient seen with Dr. Philipp Ovens

## 2021-05-18 NOTE — Assessment & Plan Note (Signed)
-  ddx waldenstroms vs MM, likely MM - kappa:gamma ratio >100 - 25 pound unintentional weightloss. Weight up 0.8 kg today from, last visit - skin rash - Microcytic anemia - Bone pain - DG bone survey negative for focal or lytic blastic lesions throughout axial or appendicular skeleton. Degenerative changes noted - Discussed probable diagnosis of MM with pt.  - oxycodone 5-325 mg. Reports having 2 and a half tabs left. Ordered 5 day refill and reassess after bone marrow bx. - CT bone marrow biopsy tomorrow at [facility] long 05/19/21

## 2021-05-18 NOTE — Assessment & Plan Note (Signed)
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

## 2021-05-18 NOTE — Assessment & Plan Note (Signed)
-   bp 109/70 - on lasix, aldactone and losartan and coreg

## 2021-05-18 NOTE — Assessment & Plan Note (Signed)
-   Baseline ~9. Hgb 9/1 on cbc from 7/27.

## 2021-05-19 ENCOUNTER — Ambulatory Visit (HOSPITAL_COMMUNITY)
Admission: RE | Admit: 2021-05-19 | Discharge: 2021-05-19 | Disposition: A | Discharge status: Home / Self Care | Payer: Medicaid Other | Source: Ambulatory Visit | Attending: Hematology and Oncology | Admitting: Hematology and Oncology

## 2021-05-19 ENCOUNTER — Other Ambulatory Visit: Payer: Self-pay | Admitting: Internal Medicine

## 2021-05-19 ENCOUNTER — Other Ambulatory Visit: Payer: Self-pay | Admitting: *Deleted

## 2021-05-19 ENCOUNTER — Other Ambulatory Visit: Payer: Self-pay

## 2021-05-19 ENCOUNTER — Telehealth: Payer: Self-pay | Admitting: *Deleted

## 2021-05-19 DIAGNOSIS — D751 Secondary polycythemia: Secondary | ICD-10-CM | POA: Insufficient documentation

## 2021-05-19 DIAGNOSIS — D649 Anemia, unspecified: Secondary | ICD-10-CM | POA: Diagnosis not present

## 2021-05-19 DIAGNOSIS — Z87891 Personal history of nicotine dependence: Secondary | ICD-10-CM | POA: Diagnosis not present

## 2021-05-19 DIAGNOSIS — C8519 Unspecified B-cell lymphoma, extranodal and solid organ sites: Secondary | ICD-10-CM | POA: Diagnosis not present

## 2021-05-19 DIAGNOSIS — D6489 Other specified anemias: Secondary | ICD-10-CM | POA: Diagnosis not present

## 2021-05-19 DIAGNOSIS — D472 Monoclonal gammopathy: Secondary | ICD-10-CM | POA: Diagnosis not present

## 2021-05-19 DIAGNOSIS — D500 Iron deficiency anemia secondary to blood loss (chronic): Secondary | ICD-10-CM

## 2021-05-19 DIAGNOSIS — D75839 Thrombocytosis, unspecified: Secondary | ICD-10-CM | POA: Diagnosis not present

## 2021-05-19 LAB — CBC WITH DIFFERENTIAL/PLATELET
Abs Immature Granulocytes: 0.02 10*3/uL (ref 0.00–0.07)
Basophils Absolute: 0 10*3/uL (ref 0.0–0.1)
Basophils Relative: 1 %
Eosinophils Absolute: 0.6 10*3/uL — ABNORMAL HIGH (ref 0.0–0.5)
Eosinophils Relative: 9 %
HCT: 24.3 % — ABNORMAL LOW (ref 39.0–52.0)
Hemoglobin: 7.4 g/dL — ABNORMAL LOW (ref 13.0–17.0)
Immature Granulocytes: 0 %
Lymphocytes Relative: 19 %
Lymphs Abs: 1.2 10*3/uL (ref 0.7–4.0)
MCH: 23.5 pg — ABNORMAL LOW (ref 26.0–34.0)
MCHC: 30.5 g/dL (ref 30.0–36.0)
MCV: 77.1 fL — ABNORMAL LOW (ref 80.0–100.0)
Monocytes Absolute: 0.7 10*3/uL (ref 0.1–1.0)
Monocytes Relative: 11 %
Neutro Abs: 3.8 10*3/uL (ref 1.7–7.7)
Neutrophils Relative %: 60 %
Platelets: 432 10*3/uL — ABNORMAL HIGH (ref 150–400)
RBC: 3.15 MIL/uL — ABNORMAL LOW (ref 4.22–5.81)
RDW: 17.4 % — ABNORMAL HIGH (ref 11.5–15.5)
WBC: 6.4 10*3/uL (ref 4.0–10.5)
nRBC: 0 % (ref 0.0–0.2)

## 2021-05-19 MED ORDER — FENTANYL CITRATE (PF) 100 MCG/2ML IJ SOLN
INTRAMUSCULAR | Status: AC | PRN
  Administered 2021-05-19 (×2): 50 ug via INTRAVENOUS

## 2021-05-19 MED ORDER — NALOXONE HCL 0.4 MG/ML IJ SOLN
INTRAMUSCULAR | Status: AC
  Filled 2021-05-19: qty 1

## 2021-05-19 MED ORDER — MIDAZOLAM HCL 2 MG/2ML IJ SOLN
INTRAMUSCULAR | Status: AC | PRN
  Administered 2021-05-19 (×2): 1 mg via INTRAVENOUS

## 2021-05-19 MED ORDER — LIDOCAINE HCL (PF) 1 % IJ SOLN
INTRAMUSCULAR | Status: AC | PRN
  Administered 2021-05-19: 10 mL via INTRADERMAL

## 2021-05-19 MED ORDER — MIDAZOLAM HCL 2 MG/2ML IJ SOLN
INTRAMUSCULAR | Status: AC
  Filled 2021-05-19: qty 4

## 2021-05-19 MED ORDER — SODIUM CHLORIDE 0.9 % IV SOLN: INTRAVENOUS | Status: DC

## 2021-05-19 MED ORDER — FLUMAZENIL 0.5 MG/5ML IV SOLN
INTRAVENOUS | Status: AC
  Filled 2021-05-19: qty 5

## 2021-05-19 MED ORDER — FENTANYL CITRATE (PF) 100 MCG/2ML IJ SOLN
INTRAMUSCULAR | Status: AC
  Filled 2021-05-19: qty 4

## 2021-05-19 NOTE — Consult Note (Signed)
Chief Complaint: Patient was seen in consultation today for CT-guided bone marrow biopsy  Referring Physician(s): Dorsey,John T IV  Supervising Physician: Daryll Brod  Patient Status: [facility] - Out-pt  History of Present Illness: Connor Solomon is a 60 y.o. 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  He presents today for CT-guided bone marrow biopsy for further evaluation.   Past Medical History:  Diagnosis Date   Acute heart failure ([place]) 04/08/2021   Acute HFrEF (heart failure with reduced ejection fraction) ([place]) 04/07/2021   Arthritis    hands, elbows bilaterally   Cancer ([place])    Full dentures    Hypertension    Pt states he doen not have HTN and has never been treated for HTN   Kidney stone on left side last stone 4-5 yrs ago   recurrent (3 episodes)   Panic disorder    pt states has resolved   Syncope    resolved- was related to panic attacks    Past Surgical History:  Procedure Laterality Date   CARPAL METACARPAL FUSION WITH DISTAL RADIAL BONE GRAFT Left 05/30/2013   Procedure: [place];  Surgeon: Schuyler Amor, MD;  Location: [place];  Service: Orthopedics;  Laterality: Left;   ESOPHAGOGASTRODUODENOSCOPY N/A 02/12/2014   Procedure: ESOPHAGOGASTRODUODENOSCOPY (EGD);  Surgeon: Jerene Bears, MD;  Location: [facility] ENDOSCOPY;  Service: Endoscopy;  Laterality: N/A;   FOREIGN BODY REMOVAL N/A 02/12/2014   Procedure: FOREIGN BODY REMOVAL;  Surgeon: Jerene Bears, MD;  Location: [place];  Service: Endoscopy;  Laterality: N/A;   OPEN REDUCTION INTERNAL FIXATION (ORIF) DISTAL RADIAL FRACTURE Left 11/29/2012   Procedure: OPEN REDUCTION INTERNAL FIXATION (ORIF) DISTAL RADIAL FRACTURE;  Surgeon: Schuyler Amor, MD;  Location: [place];  Service: Orthopedics;  Laterality: Left;  LEFT DISTAL RADIUS OSTEOTOMY WITH BONE GRAFT   RIGHT/LEFT HEART CATH AND CORONARY ANGIOGRAPHY N/A 04/09/2021   Procedure: RIGHT/LEFT HEART CATH AND CORONARY ANGIOGRAPHY;  Surgeon: Jolaine Artist, MD;  Location: [place] CV [facility];  Service: Cardiovascular;  Laterality: N/A;   TENDON REPAIR Left 02/07/2013   Procedure: LEFT EXTENSOR INDICIS PROPRIUS  TO EXTENSOR POLLICIS LONGUS TENDON TRANSFER;  Surgeon: Schuyler Amor, MD;  Location: [place];  Service: Orthopedics;  Laterality: Left;   WISDOM TOOTH EXTRACTION     WRIST SURGERY     fx only    Allergies: Hydrocodone  Medications: Prior to Admission medications   Medication Sig Start Date End Date Taking? Authorizing Provider  acetaminophen (TYLENOL) 500 MG tablet Take 500 mg by mouth every 6 (six) hours as needed.    [provider]  albuterol (VENTOLIN HFA) 108 (90 Base) MCG/ACT inhaler Inhale 2 puffs into the lungs every 6 (six) hours as needed for wheezing or shortness of breath. 03/11/21   Icard, Octavio Graves, DO  carvedilol (COREG) 3.125 MG tablet Take 1 tablet (3.125 mg total) by mouth 2 (two) times daily. 04/17/21   [place], Maricela Bo, FNP  carvedilol (COREG) 3.125 MG tablet Take 1 tablet (3.125 mg total) by mouth 2 (two) times daily. 05/08/21     ferrous sulfate 300 (60 Fe) MG/5ML syrup Take 5 mLs (300 mg total) by mouth daily. 03/27/21   [name], NP  furosemide (LASIX) 20 MG tablet Take 1 tablet (  20 mg total) by mouth as needed for swelling. Patient not taking: No sig reported 04/17/21   Rafael Bihari, FNP  furosemide (LASIX) 20 MG tablet Take 1 tablet (20 mg total) by mouth daily as needed for swelling 05/08/21     losartan (COZAAR) 25 MG tablet Take 0.5 tablets (12.5 mg total) by mouth at bedtime. 04/17/21   [place], Maricela Bo, FNP  losartan (COZAAR) 25 MG tablet Take 0.5 tablets (12.5 mg total) by  mouth at bedtime. 05/08/21     oxyCODONE-acetaminophen (PERCOCET) 5-325 MG tablet Take 1 tablet by mouth every 8 (eight) hours as needed for up to 5 days for severe pain. 05/18/21 05/23/21  Velna Ochs, MD  spironolactone (ALDACTONE) 25 MG tablet Take 0.5 tablets (12.5 mg total) by mouth daily. 05/08/21   Rafael Bihari, FNP  spironolactone (ALDACTONE) 25 MG tablet Take 1 tablet (25 mg total) by mouth daily. 05/08/21        Family History  Problem Relation Age of Onset   Heart attack Father    Sudden Cardiac Death Father 38   Diabetes Neg Hx    Hyperlipidemia Neg Hx    Hypertension Neg Hx     Social History   Socioeconomic History   Marital status: Single    Spouse name: Not on file   Number of children: Not on file   Years of education: Not on file   Highest education level: Not on file  Occupational History   Not on file  Tobacco Use   Smoking status: Former    Types: Cigars    Quit date: 10/11/2020    Years since quitting: 0.6   Smokeless tobacco: Never   Tobacco comments:    smokes 1 cigar a week  Substance and Sexual Activity   Alcohol use: No   Drug use: No   Sexual activity: Not on file  Other Topics Concern   Not on file  Social History Narrative   Not on file   Social Determinants of Health   Financial Resource Strain: High Risk   Difficulty of Paying Living Expenses: Hard  Food Insecurity: Food Insecurity Present   Worried About [place] in the Last Year: Sometimes true   Ran Out of Food in the Last Year: Sometimes true  Transportation Needs: No Transportation Needs   Lack of Transportation (Medical): No   Lack of Transportation (Non-Medical): No  Physical Activity: Not on file  Stress: Not on file  Social Connections: Not on file      Review of Systems currently denies fever, back pain, nausea, vomiting or bleeding.  Does have occasional headaches, intermittent chest discomfort, dyspnea with exertion, occasional cough, intermittent  right lower abdominal discomfort and fatigue  Vital Signs: Vitals:   05/19/21 0932  BP: 112/68  Pulse: 91  Resp: 20  Temp: 98 F (36.7 C)  SpO2: 98%      Physical Exam awake, alert.  Chest clear to auscultation bilaterally.  Heart with regular rate and rhythm.  Abdomen soft, positive bowel sounds, nontender.  No lower extremity edema.  Imaging: DG Bone Survey Met  Result Date: 05/14/2021 CLINICAL DATA:  MGUS, assess for lytic lesions. IgM monoclonal gammopathy of uncertain significance. EXAM: METASTATIC BONE SURVEY COMPARISON:  Chest CT yesterday.  Chest abdomen pelvis CT 04/07/2021 FINDINGS: No evidence of focal lytic or blastic lesions throughout the axial or appendicular skeleton. No periosteal reaction or bone destruction. Remote healed fracture of the right proximal humerus. Remote healed fracture  of the distal left radius with plate and screw fixation. There is degenerative change throughout the cervical, thoracic, and lumbar spine. No compression deformity. The lungs are clear. Heart is normal in size. Normal abdominal bowel gas pattern. IMPRESSION: 1. No focal lytic or blastic lesions throughout the axial or appendicular skeleton. 2. Degenerative change throughout the spine. Electronically Signed   By: Keith Rake M.D.   On: 05/14/2021 19:18   CT Super D Chest Wo Contrast  Result Date: 05/12/2021 CLINICAL DATA:  Right lung nodules, shortness of breath EXAM: CT CHEST WITHOUT CONTRAST TECHNIQUE: Multidetector CT imaging of the chest was performed using thin slice collimation for electromagnetic bronchoscopy planning purposes, without intravenous contrast. COMPARISON:  CT chest abdomen pelvis, 05/31/2020, CT chest angiogram, 04/07/2021 FINDINGS: Cardiovascular: Aortic atherosclerosis. Normal heart size. Left and right coronary artery calcifications. No pericardial effusion. Mediastinum/Nodes: Unchanged prominent, nonspecific bilateral axillary lymph nodes (series 2, image 35). No  enlarged mediastinal or hilar lymph nodes. Thyroid gland, trachea, and esophagus demonstrate no significant findings. Lungs/Pleura: Occasional small pulmonary nodules are present bilaterally, the largest a 6 mm nodule of the anterior right middle lobe (series 8, image 102). These are stable compared to prior examinations and are benign. Background of very fine centrilobular pulmonary nodules, most concentrated in the lung apices. Nonspecific ill-defined infectious or inflammatory ground-glass opacity of the anterior left upper lobe (series 8, image 71). No pleural effusion or pneumothorax. Upper Abdomen: No acute abnormality. Musculoskeletal: No chest wall mass or suspicious bone lesions identified. IMPRESSION: 1. Occasional small pulmonary nodules are present bilaterally, measuring 6 mm and smaller, stable and benign. No further follow-up or characterization is required. 2. Background of very fine centrilobular pulmonary nodules, most concentrated in the lung apices, most commonly seen in smoking-related respiratory bronchiolitis. 3. Coronary artery disease. Aortic Atherosclerosis (ICD10-I70.0). Electronically Signed   By: Eddie Candle M.D.   On: 05/12/2021 16:34    Labs:  CBC: Recent Labs    04/07/21 1013 04/08/21 0410 04/09/21 0249 04/09/21 1516 04/09/21 1520 04/09/21 1521 05/06/21 1413  WBC 8.0 9.7 11.1*  --   --   --  7.3  HGB 9.0* 9.2* 9.5* 8.8* 9.2* 9.9* 9.1*  HCT 29.7* 29.0* 30.0* 26.0* 27.0* 29.0* 29.5*  PLT 459* 451* 525*  --   --   --  516*    COAGS: No results for input(s): INR, APTT in the last 8760 hours.  BMP: Recent Labs    05/31/20 0508 04/07/21 1013 04/10/21 0354 04/17/21 1605 05/06/21 1413 05/08/21 1518  NA 136   < > 134* 132* 136 133*  K 3.9   < > 4.3 4.2 4.5 4.2  CL 102   < > 100 101 101 101  CO2 24   < > $R'24 23 25 22  'Kk$ GLUCOSE 108*   < > 123* 92 88 98  BUN 8   < > $R'16 6 9 9  'kN$ CALCIUM 9.2   < > 8.9 8.6* 9.8 9.0  CREATININE 0.78   < > 0.99 0.81 0.86 0.82   GFRNONAA >60   < > >60 >60 >60 >60  GFRAA >60  --   --   --   --   --    < > = values in this interval not displayed.    LIVER FUNCTION TESTS: Recent Labs    05/31/20 0508 04/08/21 0410 05/06/21 1413  BILITOT 0.4 0.6 0.5  AST 16 13* 10*  ALT $Re'11 12 7  'ynZ$ ALKPHOS 80 72 92  PROT  7.6 7.1 7.9  ALBUMIN 3.1* 2.5* 2.6*    TUMOR MARKERS: No results for input(s): AFPTM, CEA, CA199, CHROMGRNA in the last 8760 hours.  Assessment and Plan: 60 y.o. 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  He presents today for CT-guided bone marrow biopsy for further evaluation.Risks and benefits of procedure was discussed with the patient including, but not limited to bleeding, infection, damage to adjacent structures or low yield requiring additional tests.  All of the questions were answered and there is agreement to proceed.  Consent signed and in chart.    Thank you for this interesting consult.  I greatly enjoyed meeting Connor Solomon and look forward to participating in their care.  A copy of this report was sent to the requesting provider on this date.  Electronically Signed: D. Rowe Robert, PA-C 05/19/2021, 9:21 AM   I spent a total of 20 Minutes    in face to face in clinical consultation, greater than 50% of which was counseling/coordinating care for CT-guided bone marrow biopsy

## 2021-05-19 NOTE — Discharge Instructions (Signed)
Urgent needs - Interventional Radiology on call MD [phone]  Wound - May remove dressing and shower tomorrow.  Keep site clean and dry.  Replace with bandaid as needed.  Do not submerge in tub or water until site healing well. If closed with glue, glue will flake off on its own.

## 2021-05-19 NOTE — Telephone Encounter (Signed)
Received call from radiology with CBC results. Pt's HGB is 7.4. This down from 9.1 on 05/06/21. Pt is having a bone marrow biopsy today. They are planning to proceed with this.  Pt remains SOB and very fatigued, VSS Pt to be seen here on 05/22/21.  Will add labs on that day with repeat CBC and blood bank hold 

## 2021-05-19 NOTE — Procedures (Signed)
Interventional Radiology Procedure Note  Procedure: CT BM ASP AND CORE BX    Complications: None  Estimated Blood Loss:  MIN  Findings: 11 G CORE AND ASP    M. TREVOR Urias Sheek, MD    

## 2021-05-20 ENCOUNTER — Encounter: Payer: Self-pay | Admitting: Internal Medicine

## 2021-05-20 NOTE — Progress Notes (Signed)
Internal Medicine Clinic Attending  I saw and evaluated the patient.  I personally confirmed the key portions of the history and exam documented by Dr. Elliot Gurney and I reviewed pertinent patient test results.  The assessment, diagnosis, and plan were formulated together and I agree with the documentation in the resident's note.  Patient is a 60 year old male with likely multiple myeloma versus waldenstroms macroglobulinemia currently being worked up by oncology.  He has an IgM monoclonal gammopathy with elevated kappa light chains and high free light chain ratio of 125.  He has had a 25 pound unintentional weight loss with associated fatigue and unexplained iron deficiency anemia.  He is complaining of severe bone pain that is migratory in nature.  He was prescribed Percocet at a recent ED visit on 05/09/2021.  He did have a bone scan on 05/14/2021 that showed no focal lytic or blastic lesions.  However plain films are not the most sensitive test and generally whole-body cross-sectional CT is preferred if clinical suspicion is high. He has never experienced bone pain like this before and feels it is different than his regular arthritis pain.  The oxycodone allows him to maintain functionality throughout the day.  Even though he has no lytic lesions on his bone scan, I suspect his pain is related to what ever malignant process is going on in his bone marrow and is reasonable to continue with oxycodone for now.  We have given a short-term 5-day refill.  He is scheduled for bone marrow biopsy tomorrow.  Pending results and completion of his work-up, consider continuing this medication more long-term if patient finds benefit.   Lastly, patient is complaining of a rash on his bilateral thighs that has been present for the past few months and correlates with his other symptoms.  See pictures under media tab from today's visit.  The rash has a reticular pattern almost like livedo reticularis however it is raised with  some dry scaling and evidence of excoriation.  He has been using over-the-counter topical cortisone cream. We have asked patient to return to for a punch biopsy. I worry this could be a cutaneous manifestation of his underlying malignancy and punch biopsy of a newly formed lesion could be helpful.

## 2021-05-21 ENCOUNTER — Telehealth (HOSPITAL_COMMUNITY): Payer: Self-pay | Admitting: Licensed Clinical Social Worker

## 2021-05-21 NOTE — Telephone Encounter (Signed)
CSW contacted patient to confirm funding for [employer] and that payment in full has been made to [employer]. Patient grateful for the support and assistance to bridge the financial gap until disability determination. Raquel Sarna, [place], [place]

## 2021-05-22 ENCOUNTER — Inpatient Hospital Stay: Payer: Medicaid Other

## 2021-05-22 ENCOUNTER — Inpatient Hospital Stay: Payer: Medicaid Other | Attending: Hematology and Oncology | Admitting: Hematology and Oncology

## 2021-05-22 ENCOUNTER — Other Ambulatory Visit: Payer: Self-pay

## 2021-05-22 DIAGNOSIS — I700 Atherosclerosis of aorta: Secondary | ICD-10-CM | POA: Diagnosis not present

## 2021-05-22 DIAGNOSIS — R5383 Other fatigue: Secondary | ICD-10-CM | POA: Insufficient documentation

## 2021-05-22 DIAGNOSIS — Z5112 Encounter for antineoplastic immunotherapy: Secondary | ICD-10-CM | POA: Insufficient documentation

## 2021-05-22 DIAGNOSIS — I2510 Atherosclerotic heart disease of native coronary artery without angina pectoris: Secondary | ICD-10-CM | POA: Diagnosis not present

## 2021-05-22 DIAGNOSIS — I10 Essential (primary) hypertension: Secondary | ICD-10-CM | POA: Insufficient documentation

## 2021-05-22 DIAGNOSIS — I5021 Acute systolic (congestive) heart failure: Secondary | ICD-10-CM | POA: Diagnosis not present

## 2021-05-22 DIAGNOSIS — D500 Iron deficiency anemia secondary to blood loss (chronic): Secondary | ICD-10-CM

## 2021-05-22 DIAGNOSIS — C880 Waldenstrom macroglobulinemia: Secondary | ICD-10-CM | POA: Diagnosis not present

## 2021-05-22 DIAGNOSIS — Z87891 Personal history of nicotine dependence: Secondary | ICD-10-CM | POA: Insufficient documentation

## 2021-05-22 DIAGNOSIS — Z596 Low income: Secondary | ICD-10-CM | POA: Insufficient documentation

## 2021-05-22 DIAGNOSIS — R590 Localized enlarged lymph nodes: Secondary | ICD-10-CM | POA: Diagnosis not present

## 2021-05-22 DIAGNOSIS — Z5941 Food insecurity: Secondary | ICD-10-CM | POA: Insufficient documentation

## 2021-05-22 DIAGNOSIS — D472 Monoclonal gammopathy: Secondary | ICD-10-CM | POA: Diagnosis not present

## 2021-05-22 DIAGNOSIS — D649 Anemia, unspecified: Secondary | ICD-10-CM | POA: Insufficient documentation

## 2021-05-22 DIAGNOSIS — I110 Hypertensive heart disease with heart failure: Secondary | ICD-10-CM | POA: Diagnosis not present

## 2021-05-22 DIAGNOSIS — R110 Nausea: Secondary | ICD-10-CM | POA: Insufficient documentation

## 2021-05-22 DIAGNOSIS — Z8249 Family history of ischemic heart disease and other diseases of the circulatory system: Secondary | ICD-10-CM | POA: Diagnosis not present

## 2021-05-22 DIAGNOSIS — Z79899 Other long term (current) drug therapy: Secondary | ICD-10-CM | POA: Diagnosis not present

## 2021-05-22 LAB — CBC WITH DIFFERENTIAL (CANCER CENTER ONLY)
Abs Immature Granulocytes: 0.04 10*3/uL (ref 0.00–0.07)
Basophils Absolute: 0 10*3/uL (ref 0.0–0.1)
Basophils Relative: 0 %
Eosinophils Absolute: 0.2 10*3/uL (ref 0.0–0.5)
Eosinophils Relative: 3 %
HCT: 25.9 % — ABNORMAL LOW (ref 39.0–52.0)
Hemoglobin: 8.2 g/dL — ABNORMAL LOW (ref 13.0–17.0)
Immature Granulocytes: 1 %
Lymphocytes Relative: 17 %
Lymphs Abs: 1.3 10*3/uL (ref 0.7–4.0)
MCH: 23.7 pg — ABNORMAL LOW (ref 26.0–34.0)
MCHC: 31.7 g/dL (ref 30.0–36.0)
MCV: 74.9 fL — ABNORMAL LOW (ref 80.0–100.0)
Monocytes Absolute: 0.4 10*3/uL (ref 0.1–1.0)
Monocytes Relative: 6 %
Neutro Abs: 5.4 10*3/uL (ref 1.7–7.7)
Neutrophils Relative %: 73 %
Platelet Count: 387 10*3/uL (ref 150–400)
RBC: 3.46 MIL/uL — ABNORMAL LOW (ref 4.22–5.81)
RDW: 17.5 % — ABNORMAL HIGH (ref 11.5–15.5)
WBC Count: 7.4 10*3/uL (ref 4.0–10.5)
nRBC: 0 % (ref 0.0–0.2)

## 2021-05-22 LAB — ABO/RH: ABO/RH(D): A POS

## 2021-05-22 LAB — SAMPLE TO BLOOD BANK

## 2021-05-22 LAB — SURGICAL PATHOLOGY

## 2021-05-22 MED ORDER — OXYCODONE-ACETAMINOPHEN 5-325 MG PO TABS: 1.0000 | ORAL_TABLET | Freq: Three times a day (TID) | ORAL | 0 refills | Status: AC | PRN

## 2021-05-24 ENCOUNTER — Encounter: Payer: Self-pay | Admitting: Hematology and Oncology

## 2021-05-24 DIAGNOSIS — C880 Waldenstrom macroglobulinemia: Secondary | ICD-10-CM | POA: Insufficient documentation

## 2021-05-24 MED ORDER — IBRUTINIB 420 MG PO TABS
420.0000 mg | ORAL_TABLET | Freq: Every day | ORAL | 2 refills | Status: DC
  Filled 2021-05-24: qty 56, fill #0
  Filled 2021-06-01: qty 28, 28d supply, fill #0
  Filled 2021-09-23: qty 28, 28d supply, fill #1

## 2021-05-24 NOTE — Progress Notes (Signed)
[place] [phone]   [phone]  PROGRESS NOTE  Patient Care Team: Delene Ruffini, MD as PCP - General (Internal Medicine)  Hematological/Oncological History # Waldenstrom's Macroglobulinemia # IgM Monoclonal Gammopathy 04/07/2021: CT A/p showed mildly enlarged retroperitoneal lymph nodes and bilateral inguinal lymph nodes.  04/08/2021: IgM Kappa M protein 1.2 04/09/2021: WBC 11.1, Hgb 9.5, MCV 75.4, Plt 525 04/20/2021: Kappa 977, Lambda 7.8, K/L ratio 125.28. Serum viscosity 1.8 05/06/2021: Establish care with Dr. Lorenso Courier 05/19/2021: Bone marrow biopsy performed showed hypercellular bone marrow involved by non-Hodgkin B-cell lymphoma, findings most consistent with Waldenstrom's macroglobulinemia   Interval History:  Connor Solomon 60 y.o. male with medical history significant for Waldenstrom's macroglobulinemia who presents for a follow up visit. The patient's last visit was on 05/06/2021. 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  On exam today Connor Solomon notes that he is having difficulty with his symptoms.  He is having trouble concentrating, fatigue, nausea, and decreased appetite.  He notes he is not having any issues with fevers, chills, sweats.  He reports that he is also continuing to have back pain for which she has requested a refill of his pain medication today.  He notes he is not having any issues with vision changes, shortness of breath, or chest pain.  A full 10 point ROS is listed below.  The bulk of our discussion today focused on the diagnosis of lymphoplasmacytic lymphoma and the treatment moving forward.  The patient voices understanding of the plan.  MEDICAL HISTORY:  Past Medical History:  Diagnosis Date   Acute heart failure ([place]) 04/08/2021   Acute HFrEF (heart failure with reduced ejection fraction) ([place]) 04/07/2021   Arthritis    hands,  elbows bilaterally   Cancer (HCC)    Full dentures    Hypertension    Pt states he doen not have HTN and has never been treated for HTN   Kidney stone on left side last stone 4-5 yrs ago   recurrent (3 episodes)   Panic disorder    pt states has resolved   Syncope    resolved- was related to panic attacks    SURGICAL HISTORY: Past Surgical History:  Procedure Laterality Date   CARPAL METACARPAL FUSION WITH DISTAL RADIAL BONE GRAFT Left 05/30/2013   Procedure: LEFT THUMB METACARPAL JOINT FUSION;  Surgeon: Schuyler Amor, MD;  Location: [place];  Service: Orthopedics;  Laterality: Left;   ESOPHAGOGASTRODUODENOSCOPY N/A 02/12/2014   Procedure: ESOPHAGOGASTRODUODENOSCOPY (EGD);  Surgeon: Jerene Bears, MD;  Location: [facility] ENDOSCOPY;  Service: Endoscopy;  Laterality: N/A;   FOREIGN BODY REMOVAL N/A 02/12/2014   Procedure: FOREIGN BODY REMOVAL;  Surgeon: Jerene Bears, MD;  Location: [place];  Service: Endoscopy;  Laterality: N/A;   OPEN REDUCTION INTERNAL FIXATION (ORIF) DISTAL RADIAL FRACTURE Left 11/29/2012   Procedure: OPEN REDUCTION INTERNAL FIXATION (ORIF) DISTAL RADIAL FRACTURE;  Surgeon: Schuyler Amor, MD;  Location: [place];  Service: Orthopedics;  Laterality: Left;  LEFT DISTAL RADIUS OSTEOTOMY WITH BONE GRAFT   RIGHT/LEFT HEART CATH AND CORONARY ANGIOGRAPHY N/A 04/09/2021   Procedure: RIGHT/LEFT HEART CATH AND CORONARY ANGIOGRAPHY;  Surgeon: Jolaine Artist, MD;  Location: [facility];  Service: Cardiovascular;  Laterality: N/A;   TENDON REPAIR Left 02/07/2013   Procedure: LEFT EXTENSOR INDICIS PROPRIUS  TO EXTENSOR POLLICIS LONGUS TENDON TRANSFER;  Surgeon: Schuyler Amor, MD;  Location: [place];  Service: Orthopedics;  Laterality: Left;   WISDOM TOOTH EXTRACTION     WRIST SURGERY     fx only    SOCIAL HISTORY: Social History   Socioeconomic History   Marital status: Single    Spouse name: Not on file    Number of children: Not on file   Years of education: Not on file   Highest education level: Not on file  Occupational History   Not on file  Tobacco Use   Smoking status: Former    Types: Cigars    Quit date: 10/11/2020    Years since quitting: 0.6   Smokeless tobacco: Never   Tobacco comments:    smokes 1 cigar a week  Substance and Sexual Activity   Alcohol use: No   Drug use: No   Sexual activity: Not on file  Other Topics Concern   Not on file  Social History Narrative   Not on file   Social Determinants of Health   Financial Resource Strain: High Risk   Difficulty of Paying Living Expenses: Hard  Food Insecurity: Food Insecurity Present   Worried About Running Out of Food in the Last Year: Sometimes true   Ran Out of Food in the Last Year: Sometimes true  Transportation Needs: No Transportation Needs   Lack of Transportation (Medical): No   Lack of Transportation (Non-Medical): No  Physical Activity: Not on file  Stress: Not on file  Social Connections: Not on file  Intimate Partner Violence: Not on file    FAMILY HISTORY: Family History  Problem Relation Age of Onset   Heart attack Father    Sudden Cardiac Death Father 58   Diabetes Neg Hx    Hyperlipidemia Neg Hx    Hypertension Neg Hx     ALLERGIES:  is allergic to hydrocodone.  MEDICATIONS:  Current Outpatient Medications  Medication Sig Dispense Refill   ferrous sulfate 325 (65 FE) MG tablet Take 325 mg by mouth daily with breakfast.     ibrutinib 420 MG TABS Take 420 mg by mouth daily. 30 tablet 2   acetaminophen (TYLENOL) 500 MG tablet Take 500 mg by mouth every 6 (six) hours as needed.     albuterol (VENTOLIN HFA) 108 (90 Base) MCG/ACT inhaler Inhale 2 puffs into the lungs every 6 (six) hours as needed for wheezing or shortness of breath. 8 g 2   carvedilol (COREG) 3.125 MG tablet Take 1 tablet (3.125 mg total) by mouth 2 (two) times daily. 68 tablet 2   furosemide (LASIX) 20 MG tablet Take 1  tablet (20 mg total) by mouth as needed for swelling. (Patient not taking: No sig reported) 100 tablet 2   furosemide (LASIX) 20 MG tablet Take 1 tablet (20 mg total) by mouth daily as needed for swelling 100 tablet 2   losartan (COZAAR) 25 MG tablet Take 0.5 tablets (12.5 mg total) by mouth at bedtime. 17 tablet 2   oxyCODONE-acetaminophen (PERCOCET) 5-325 MG tablet Take 1 tablet by mouth every 8 (eight) hours as needed for up to 5 days for severe pain. 30 tablet 0   spironolactone (ALDACTONE) 25 MG tablet Take 1 tablet (25 mg total) by mouth daily. 34 tablet 2   No current facility-administered medications for this visit.    REVIEW OF SYSTEMS:   Constitutional: ( - ) fevers, ( - )  chills , ( - ) night sweats Eyes: ( - ) blurriness of vision, ( - ) double  vision, ( - ) watery eyes Ears, nose, mouth, throat, and face: ( - ) mucositis, ( - ) sore throat Respiratory: ( - ) cough, ( - ) dyspnea, ( - ) wheezes Cardiovascular: ( - ) palpitation, ( - ) chest discomfort, ( - ) lower extremity swelling Gastrointestinal:  ( - ) nausea, ( - ) heartburn, ( - ) change in bowel habits Skin: ( - ) abnormal skin rashes Lymphatics: ( - ) new lymphadenopathy, ( - ) easy bruising Neurological: ( - ) numbness, ( - ) tingling, ( - ) new weaknesses Behavioral/Psych: ( - ) mood change, ( - ) new changes  All other systems were reviewed with the patient and are negative.  PHYSICAL EXAMINATION: ECOG PERFORMANCE STATUS: 1 - Symptomatic but completely ambulatory  Vitals:   05/22/21 1351  BP: 119/70  Pulse: 99  Resp: 18  Temp: 98.8 F (37.1 C)  SpO2: 100%   Filed Weights   05/22/21 1351  Weight: 149 lb 4.8 oz (67.7 kg)    GENERAL: Well-appearing middle-aged Caucasian male, alert, no distress and comfortable SKIN: skin color, texture, turgor are normal, no rashes or significant lesions EYES: conjunctiva are pink and non-injected, sclera clear OROPHARYNX: no exudate, no erythema; lips, buccal mucosa,  and tongue normal  NECK: supple, non-tender LYMPH:  no palpable lymphadenopathy in the cervical, axillary or inguinal LUNGS: clear to auscultation and percussion with normal breathing effort HEART: regular rate & rhythm and no murmurs and no lower extremity edema  PSYCH: alert & oriented x 3, fluent speech NEURO: no focal motor/sensory deficits  LABORATORY DATA:  I have reviewed the data as listed CBC Latest Ref Rng & Units 05/22/2021 05-23-2021 05/06/2021  WBC 4.0 - 10.5 K/uL 7.4 6.4 7.3  Hemoglobin 13.0 - 17.0 g/dL 8.2(L) 7.4(L) 9.1(L)  Hematocrit 39.0 - 52.0 % 25.9(L) 24.3(L) 29.5(L)  Platelets 150 - 400 K/uL 387 432(H) 516(H)    CMP Latest Ref Rng & Units 05/08/2021 05/06/2021 04/17/2021  Glucose 70 - 99 mg/dL 98 88 92  BUN 6 - 20 mg/dL _0 Creatinine 0.61 - 1.24 mg/dL 0.82 0.86 0.81  Sodium 135 - 145 mmol/L 133(L) 136 132(L)  Potassium 3.5 - 5.1 mmol/L 4.2 4.5 4.2  Chloride 98 - 111 mmol/L 101 101 101  CO2 22 - 32 mmol/L _1 Calcium 8.9 - 10.3 mg/dL 9.0 9.8 8.6(L)  Total Protein 6.5 - 8.1 g/dL - 7.9 -  Total Bilirubin 0.3 - 1.2 mg/dL - 0.5 -  Alkaline Phos 38 - 126 U/L - 92 -  AST 15 - 41 U/L - 10(L) -  ALT 0 - 44 U/L - 7 -    Lab Results  Component Value Date   MPROTEIN 1.2 (H) 04/08/2021   Lab Results  Component Value Date   KAPLAMBRATIO 875.18 (H) 05/12/2021    RADIOGRAPHIC STUDIES: CT Biopsy  Result Date: 05-23-2021 INDICATION: ANEMIA, MYELOMA WORKUP, MONOCLONAL GAMMOPATHY EXAM: CT GUIDED RIGHT ILIAC BONE MARROW ASPIRATION AND CORE BIOPSY Date:  05/23/2021 2021/05/23 10:58 am Radiologist:  Jerilynn Mages. Daryll Brod, MD Guidance:  CT FLUOROSCOPY TIME:  Fluoroscopy Time: None. MEDICATIONS: 1% lidocaine local ANESTHESIA/SEDATION: 2.0 mg IV Versed; 100 mcg IV Fentanyl Moderate Sedation Time:  10 minutes The patient was continuously monitored during the procedure by the interventional radiology nurse under my direct supervision. CONTRAST:  None. COMPLICATIONS: None PROCEDURE:  Informed consent was obtained from the patient following explanation of the procedure, risks, benefits and alternatives. The patient understands, agrees  and consents for the procedure. All questions were addressed. A time out was performed. The patient was positioned prone and non-contrast localization CT was performed of the pelvis to demonstrate the iliac marrow spaces. Maximal barrier sterile technique utilized including caps, mask, sterile gowns, sterile gloves, large sterile drape, hand hygiene, and Betadine prep. Under sterile conditions and local anesthesia, an 11 gauge coaxial bone biopsy needle was advanced into the right iliac marrow space. Needle position was confirmed with CT imaging. Initially, bone marrow aspiration was performed. Next, the 11 gauge outer cannula was utilized to obtain a right iliac bone marrow core biopsy. Needle was removed. Hemostasis was obtained with compression. The patient tolerated the procedure well. Samples were prepared with the cytotechnologist. No immediate complications. IMPRESSION: CT guided right iliac bone marrow aspiration and core biopsy. Electronically Signed   By: Jerilynn Mages.  Shick M.D.   On: 05/19/2021 11:41   DG Bone Survey Met  Result Date: 05/14/2021 CLINICAL DATA:  MGUS, assess for lytic lesions. IgM monoclonal gammopathy of uncertain significance. EXAM: METASTATIC BONE SURVEY COMPARISON:  Chest CT yesterday.  Chest abdomen pelvis CT 04/07/2021 FINDINGS: No evidence of focal lytic or blastic lesions throughout the axial or appendicular skeleton. No periosteal reaction or bone destruction. Remote healed fracture of the right proximal humerus. Remote healed fracture of the distal left radius with plate and screw fixation. There is degenerative change throughout the cervical, thoracic, and lumbar spine. No compression deformity. The lungs are clear. Heart is normal in size. Normal abdominal bowel gas pattern. IMPRESSION: 1. No focal lytic or blastic lesions throughout  the axial or appendicular skeleton. 2. Degenerative change throughout the spine. Electronically Signed   By: Keith Rake M.D.   On: 05/14/2021 19:18   CT BONE MARROW BIOPSY & ASPIRATION  Result Date: 05/19/2021 INDICATION: ANEMIA, MYELOMA WORKUP, MONOCLONAL GAMMOPATHY EXAM: CT GUIDED RIGHT ILIAC BONE MARROW ASPIRATION AND CORE BIOPSY Date:  05/19/2021 05/19/2021 10:58 am Radiologist:  M. Daryll Brod, MD Guidance:  CT FLUOROSCOPY TIME:  Fluoroscopy Time: None. MEDICATIONS: 1% lidocaine local ANESTHESIA/SEDATION: 2.0 mg IV Versed; 100 mcg IV Fentanyl Moderate Sedation Time:  10 minutes The patient was continuously monitored during the procedure by the interventional radiology nurse under my direct supervision. CONTRAST:  None. COMPLICATIONS: None PROCEDURE: Informed consent was obtained from the patient following explanation of the procedure, risks, benefits and alternatives. The patient understands, agrees and consents for the procedure. All questions were addressed. A time out was performed. The patient was positioned prone and non-contrast localization CT was performed of the pelvis to demonstrate the iliac marrow spaces. Maximal barrier sterile technique utilized including caps, mask, sterile gowns, sterile gloves, large sterile drape, hand hygiene, and Betadine prep. Under sterile conditions and local anesthesia, an 11 gauge coaxial bone biopsy needle was advanced into the right iliac marrow space. Needle position was confirmed with CT imaging. Initially, bone marrow aspiration was performed. Next, the 11 gauge outer cannula was utilized to obtain a right iliac bone marrow core biopsy. Needle was removed. Hemostasis was obtained with compression. The patient tolerated the procedure well. Samples were prepared with the cytotechnologist. No immediate complications. IMPRESSION: CT guided right iliac bone marrow aspiration and core biopsy. Electronically Signed   By: Jerilynn Mages.  Shick M.D.   On: 05/19/2021 11:41   CT  Super D Chest Wo Contrast  Result Date: 05/12/2021 CLINICAL DATA:  Right lung nodules, shortness of breath EXAM: CT CHEST WITHOUT CONTRAST TECHNIQUE: Multidetector CT imaging of the chest was performed using thin  slice collimation for electromagnetic bronchoscopy planning purposes, without intravenous contrast. COMPARISON:  CT chest abdomen pelvis, 05/31/2020, CT chest angiogram, 04/07/2021 FINDINGS: Cardiovascular: Aortic atherosclerosis. Normal heart size. Left and right coronary artery calcifications. No pericardial effusion. Mediastinum/Nodes: Unchanged prominent, nonspecific bilateral axillary lymph nodes (series 2, image 35). No enlarged mediastinal or hilar lymph nodes. Thyroid gland, trachea, and esophagus demonstrate no significant findings. Lungs/Pleura: Occasional small pulmonary nodules are present bilaterally, the largest a 6 mm nodule of the anterior right middle lobe (series 8, image 102). These are stable compared to prior examinations and are benign. Background of very fine centrilobular pulmonary nodules, most concentrated in the lung apices. Nonspecific ill-defined infectious or inflammatory ground-glass opacity of the anterior left upper lobe (series 8, image 71). No pleural effusion or pneumothorax. Upper Abdomen: No acute abnormality. Musculoskeletal: No chest wall mass or suspicious bone lesions identified. IMPRESSION: 1. Occasional small pulmonary nodules are present bilaterally, measuring 6 mm and smaller, stable and benign. No further follow-up or characterization is required. 2. Background of very fine centrilobular pulmonary nodules, most concentrated in the lung apices, most commonly seen in smoking-related respiratory bronchiolitis. 3. Coronary artery disease. Aortic Atherosclerosis (ICD10-I70.0). Electronically Signed   By: Eddie Candle M.D.   On: 05/12/2021 16:34    ASSESSMENT & PLAN Connor Solomon 60 y.o. male with medical history significant for Waldenstrom's macroglobulinemia  who presents for a follow up visit.   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  There is no clear evidence of hyperviscosity syndrome at this time.  At this time we will plan to proceed with ibrutinib and rituximab therapy.  1 could also consider Bendamustine and rituximab therapy but given the patient's degree of anemia I would prefer pursuing ibrutinib and rituximab at this time.  Additionally ibrutinib rituximab are category 1 recommendations per the NCCN guidelines.  The treatment will consist of ibrutinib 420 mg p.o. daily with rituximab on weeks 1 through 4 as well as weeks 27 through 30.  Today we discussed the risks and benefits of therapy including (but not limited to) risk of bleeding, fatigue, atypical infections, and cardiac arrhythmias.  The patient voices understanding of this plan moving forward.  # Waldenstrom's Macroglobulinemia # IgM Monoclonal Gammopathy -- Findings at this time are most consistent with Waldenstrom macroglobulinemia.  This is confirmed with an IgM monoclonal colopathy and bone marrow biopsy results showing a lymphoplasmacytic lymphoma --We will plan to proceed with ibrutinib plus rituximab therapy. --No clear signs of hyperviscosity syndrome at this time. --Plan to have the patient return to clinic after completion of chemotherapy education in approximately 2 weeks time to start therapy.  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  -- At this time the patient's hemoglobin of 8.2 is most consistent with anemia due to lymphomatous involvement of bone marrow --There does appear to be a component of iron deficiency anemia as well.  Recommend the patient continue p.o. iron therapy --Continue to monitor.  No orders of the defined types were placed in this encounter.   All questions were answered. The patient knows to call the  clinic with any problems, questions or concerns.  A total of more than 30 minutes were spent on this encounter with face-to-face time and non-face-to-face time, including preparing to see the patient, ordering tests and/or medications, counseling the patient and coordination of care as outlined above.   Ledell Peoples, MD  Department of Hematology/Oncology [place] at [facility] Phone: [phone] Pager: [phone] Email: [email or web address]  05/26/2021 10:42 AM  Dimopoulos MA, Rennie Natter, T[place], Mahe B, Herbaux C, Tam C, Orsucci L, Pal[place] ML, Matous JV, Tonica C, Kastritis E, [place]burn, Li J, Salman Z, Graef T, Buske C; iNNOVATE Study Group and the [employer] for [employer]. Phase 3 Trial of Ibrutinib plus Rituximab in Waldenstrm's Macroglobulinemia. Alison Stalling J Med. 2018 Jun 21;378(25):2399-2410.   --At 30 months, the progression-free survival rate was 82% with ibrutinib-rituximab versus 28% with placebo-rituximab (hazard ratio for progression or death, 0.20; P<0.001).

## 2021-05-24 NOTE — Progress Notes (Signed)
START ON PATHWAY REGIMEN - Lymphoma and CLL     Administer weekly:     Rituximab-xxxx   **Always confirm dose/schedule in your pharmacy ordering system**  Patient Characteristics: Waldenstrom Macroglobulinemia/Lymphoplasmacytic Lymphoma, Symptomatic, First Line Disease Type: Waldenstrom Macroglobulinemia/Lymphoplasmacytic Lymphoma Disease Type: Not Applicable Disease Type: Not Applicable Asymptomatic or Symptomatic<= Symptomatic Line of Therapy: First Line Intent of Therapy: Curative Intent, Discussed with Patient

## 2021-05-25 ENCOUNTER — Other Ambulatory Visit (HOSPITAL_COMMUNITY): Payer: Self-pay

## 2021-05-25 ENCOUNTER — Telehealth: Payer: Self-pay | Admitting: Pharmacist

## 2021-05-25 ENCOUNTER — Telehealth: Payer: Self-pay | Admitting: Hematology and Oncology

## 2021-05-25 NOTE — Telephone Encounter (Signed)
Scheduled appts per 8/14 sch msg. Called pt, no answer. Left msg with appts dates and times.

## 2021-05-26 ENCOUNTER — Telehealth: Payer: Self-pay | Admitting: Hematology and Oncology

## 2021-05-26 ENCOUNTER — Other Ambulatory Visit (HOSPITAL_COMMUNITY): Payer: Self-pay

## 2021-05-26 ENCOUNTER — Encounter: Payer: Self-pay | Admitting: Hematology and Oncology

## 2021-05-26 NOTE — Telephone Encounter (Signed)
Scheduled appts per 8/16 sch msg. Will have updated calendar printed for pt at next visit.

## 2021-05-26 NOTE — Telephone Encounter (Signed)
Oral Oncology Pharmacist Encounter  Received new prescription for Imbruvica (ibrutinib) for the treatment of Waldenstrom's macroglobulinemia in conjunction with rituximab, planned duration until disease progression or unacceptable drug toxicity.  Prescription dose and frequency assessed for appropriateness. Appropriate for therapy initiation.   CBC w/ Diff from 05/22/21 and CMP from 05/06/2021 assessed, no baseline dose adjustments required at this time.  Current medication list in Epic reviewed, no relevant/significant DDIs with Imbruvica identified.   Evaluated chart and no patient barriers to medication adherence noted.   Patient agreement for treatment documented in MD note on 05/22/21.  Pres[facility]. Patient uninsured so will proceed with applying for manufacturer assistance at this time.  Oral Oncology Clinic will continue to follow for insurance authorization, copayment issues, initial counseling and start date.  Leron Croak, PharmD, BCPS Hematology/Oncology Clinical Pharmacist [facility] [phone] 05/26/2021 2:17 PM

## 2021-05-28 ENCOUNTER — Encounter (HOSPITAL_COMMUNITY): Payer: Self-pay | Admitting: Hematology and Oncology

## 2021-05-29 ENCOUNTER — Inpatient Hospital Stay: Payer: Medicaid Other

## 2021-05-29 ENCOUNTER — Encounter (HOSPITAL_COMMUNITY): Payer: Self-pay | Admitting: Hematology and Oncology

## 2021-05-29 ENCOUNTER — Telehealth: Payer: Self-pay | Admitting: *Deleted

## 2021-05-29 ENCOUNTER — Other Ambulatory Visit: Payer: Self-pay

## 2021-05-29 NOTE — Telephone Encounter (Signed)
Received call from pt requesting a refill of his pain medication. Advised that he will need to contact his primary care doctor for this. Advised that Dr. Lorenso Courier  filled it one time only but that future refills would need to come from his PCP.  His back pain is not related to the reason he comes here.  Pt voiced understanding.

## 2021-06-01 ENCOUNTER — Other Ambulatory Visit (HOSPITAL_COMMUNITY): Payer: Self-pay

## 2021-06-01 ENCOUNTER — Telehealth: Payer: Self-pay | Admitting: Internal Medicine

## 2021-06-01 NOTE — Telephone Encounter (Signed)
Oral Chemotherapy Pharmacist Encounter  I spoke with patient for overview of: Imbruvica (ibrutinib) for the treatment of Waldenstrom's macroglobulinemia in conjunction with rituximab, planned duration until disease progression or unacceptable toxicity.   Counseled patient on administration, dosing, side effects, monitoring, drug-food interactions, safe handling, storage, and disposal.  Patient will take Imbruvica $RemoveBefore'420mg'[id number]$  tablets, 1 tablet ($RemoveB'420mg'[id number]$ ) by mouth once daily.  Patient will take Imbruvica at approximately the same time each day with a full glass of water and maintain adequate hydration throughout the day.  Patient knows to avoid grapefruit or grapefruit juice while on therapy with Imbruvica.  Imbruvica start date: 06/05/21  Adverse effects include but are not limited to: bruising, decreased blood counts, N/V, diarrhea, musculoskeletal pain, arthralgias, peripheral edema, increased blood pressure, and hemorrhage. Patient aware of risk of atrial fibrillation/atrial flutter with Imbruvica.   Patient will obtain anti diarrheal and alert the office of 4 or more loose stools above baseline.  Reviewed with patient importance of keeping a medication schedule and plan for any missed doses. No barriers to medication adherence identified.  Medication reconciliation performed and medication/allergy list updated.  A one-time free trial card for Imbruvica will be used for first fill while awaiting approval of manufacturer assistance.  All questions answered.  Connor Solomon voiced understanding and appreciation.   Medication education handout placed in mail for patient. Patient knows to call the office with questions or concerns. Oral Chemotherapy Clinic phone number provided to patient.   Leron Croak, PharmD, BCPS Hematology/Oncology Clinical Pharmacist [facility] [phone] 06/01/2021 10:22 AM

## 2021-06-01 NOTE — Telephone Encounter (Signed)
Oral Chemotherapy Pharmacist Encounter   Attempted to reach patient to provide update and offer for initial counseling on oral medication: Imbruvica (ibrutinib).   No answer. Left voicemail for patient to call back to discuss details of medication acquisition and initial counseling session.  Leron Croak, PharmD, BCPS Hematology/Oncology Clinical Pharmacist [facility] [phone] 06/01/2021 9:46 AM

## 2021-06-01 NOTE — Telephone Encounter (Signed)
Spoke w/pt regarding cardiac meds, he had mixed up the carvedilol and losartan, he has been taking Losartan 25 mg Daily at bedtime and Carvedilol 1/2 tab daily, he will decrease losartan to 12.5 mg (1/2 tab) at bedtime and increase carvedilol to 1 tab BID. He continues to c/o pain and request pain meds, advised message has been sent to PCP for approval of this as we do not prescribe pain meds.

## 2021-06-01 NOTE — Telephone Encounter (Signed)
Losartan #17 with 2 refills sent to [facility] on 7/8 by outside Provider ([facility]). Discussed with patient. He has questions about why he is taking meds for high BP when he has low BP. Also, Patient is confused on how to take these meds. Taking whole tab of losartan at night and only half tab of carvedilol once daily. These meds are managed by [facility]. Advised him to call Dr. Clayborne Dana office for med rec.   Will forward request for oxycodone. States he is completely out and "needs it bad." No longer on current med list.

## 2021-06-01 NOTE — Telephone Encounter (Signed)
Refill Request- Pt. Requesting his pain medication to be refilled-per his Cancer Dr. Request (per the patient)-  Please call the patient to verify this request  oxyCODONE-acetaminophen (PERCOCET/ROXICET) 5-325 MG per tablet 1 tablet   [[id number]]   losartan (COZAAR) 25 MG tablet   [facility] (Ph: [phone])

## 2021-06-02 ENCOUNTER — Encounter: Payer: Self-pay | Admitting: *Deleted

## 2021-06-02 ENCOUNTER — Telehealth: Payer: Self-pay | Admitting: Internal Medicine

## 2021-06-02 ENCOUNTER — Telehealth: Payer: Self-pay

## 2021-06-02 LAB — SURGICAL PATHOLOGY

## 2021-06-02 NOTE — Telephone Encounter (Signed)
Called to discuss pain medication request. No answer , left message for patient to call clinic and ask for me this afternoon.

## 2021-06-02 NOTE — Telephone Encounter (Signed)
Oral Oncology Patient Advocate Encounter  Met patient in lobby to complete application for Wynetta Emery and [place] in an effort to reduce patient's out of pocket expense for Imbruvica to $0.    Application completed and faxed to [phone].   [employer] patient assistance phone number for follow up is [phone].   This encounter will be updated until final determination.  [place] Patient [place] Phone [phone] Fax [phone] 06/02/2021 1:46 PM

## 2021-06-02 NOTE — Progress Notes (Signed)
[place] Work  Clinical Social Work received call from patient requesting financial assistance.  CSW and patient briefly discussed financial resources at [facility].  Patient stated he was approved for medication assistance, and believes he met with the financial advocate to discuss the [employer].  CSW provided education on the [employer] and encouraged patient to request to see the financial advocate at his [facility] appointment on Friday 8/26.  CSW encouraged patient to call with additional questions or concerns.       Johnnye Lana, MSW, LCSW, OSW-C Clinical Social Worker [facility] [phone]

## 2021-06-03 ENCOUNTER — Other Ambulatory Visit (HOSPITAL_COMMUNITY): Payer: Self-pay

## 2021-06-03 ENCOUNTER — Other Ambulatory Visit: Payer: Self-pay

## 2021-06-03 ENCOUNTER — Encounter: Payer: Self-pay | Admitting: Internal Medicine

## 2021-06-03 ENCOUNTER — Encounter: Payer: Self-pay | Admitting: Hematology and Oncology

## 2021-06-03 ENCOUNTER — Ambulatory Visit (INDEPENDENT_AMBULATORY_CARE_PROVIDER_SITE_OTHER): Payer: Self-pay | Admitting: Internal Medicine

## 2021-06-03 VITALS — BP 118/72 | HR 95 | Temp 97.9°F | Ht 68.0 in | Wt 146.8 lb

## 2021-06-03 DIAGNOSIS — Z1331 Encounter for screening for depression: Secondary | ICD-10-CM

## 2021-06-03 DIAGNOSIS — M159 Polyosteoarthritis, unspecified: Secondary | ICD-10-CM

## 2021-06-03 DIAGNOSIS — M8949 Other hypertrophic osteoarthropathy, multiple sites: Secondary | ICD-10-CM

## 2021-06-03 DIAGNOSIS — N62 Hypertrophy of breast: Secondary | ICD-10-CM

## 2021-06-03 DIAGNOSIS — G8929 Other chronic pain: Secondary | ICD-10-CM

## 2021-06-03 MED ORDER — OXYCODONE-ACETAMINOPHEN 5-325 MG PO TABS: 1.0000 | ORAL_TABLET | Freq: Four times a day (QID) | ORAL | 0 refills | Status: DC | PRN

## 2021-06-03 NOTE — Telephone Encounter (Signed)
Patient is approved for Imbruvica at no cost from [place] [facility] 06/03/21-06/03/22.  [employer] uses W[employer].  [place] Patient [place] Phone [phone] Fax [phone] 06/03/2021 11:01 AM

## 2021-06-03 NOTE — Patient Instructions (Addendum)
Thank you for trusting me with your care. To recap, today we discussed the following:   Gynecomastia - Stop Spironolactone  - oxyCODONE-acetaminophen (PERCOCET) 5-325 MG tablet; Take 1 tablet by mouth every 6 (six) hours as needed for severe pain.  Dispense: 20 tablet; Refill: 0     Waldenstrom Macroglobulinemia

## 2021-06-04 ENCOUNTER — Encounter: Payer: Self-pay | Admitting: General Practice

## 2021-06-04 ENCOUNTER — Other Ambulatory Visit (HOSPITAL_COMMUNITY): Payer: Self-pay

## 2021-06-04 ENCOUNTER — Encounter: Payer: Self-pay | Admitting: Internal Medicine

## 2021-06-04 ENCOUNTER — Encounter: Payer: Self-pay | Admitting: Hematology and Oncology

## 2021-06-04 DIAGNOSIS — N62 Hypertrophy of breast: Secondary | ICD-10-CM | POA: Insufficient documentation

## 2021-06-04 DIAGNOSIS — Z1331 Encounter for screening for depression: Secondary | ICD-10-CM | POA: Insufficient documentation

## 2021-06-04 DIAGNOSIS — G8929 Other chronic pain: Secondary | ICD-10-CM | POA: Insufficient documentation

## 2021-06-04 DIAGNOSIS — M1990 Unspecified osteoarthritis, unspecified site: Secondary | ICD-10-CM | POA: Insufficient documentation

## 2021-06-04 NOTE — Progress Notes (Signed)
   CC: Gynecomastia   HPI:[name] is a 60 y.o. male who presents for evaluation of gynecomastia, screening for depression,and osteoarthritis . Please see individual problem based A/P for details.  Past Medical History:  Diagnosis Date   Acute heart failure ([place]) 04/08/2021   Acute HFrEF (heart failure with reduced ejection fraction) ([place]) 04/07/2021   Arthritis    hands, elbows bilaterally   Cancer ([place])    Full dentures    Hypertension    Pt states he doen not have HTN and has never been treated for HTN   Kidney stone on left side last stone 4-5 yrs ago   recurrent (3 episodes)   Panic disorder    pt states has resolved   Syncope    resolved- was related to panic attacks   Review of Systems:   Review of Systems  Constitutional:  Positive for weight loss. Negative for chills and fever.  Musculoskeletal:  Positive for back pain and joint pain.  Psychiatric/Behavioral:  Negative for depression and substance abuse.     Physical Exam: Vitals:   06/03/21 1051 06/03/21 1052  BP:  118/72  Pulse:  95  Temp:  97.9 F (36.6 C)  TempSrc:  Oral  SpO2:  100%  Weight: 146 lb 12.8 oz (66.6 kg)   Height: $Remove'5\' 8"'BCgCZSA$  (1.727 m)    General: nl weight, appears to be in pain HEENT: Conjunctiva nl , antiicteric sclerae,  Cardiovascular: Normal rate, regular rhythm.  No murmurs, rubs, or gallops Pulmonary : Equal breath sounds, No wheezes, rales, or rhonchi Abdominal: soft, nontender,  bowel sounds present Chest: unilateral swelling of left breast, tender to touch and guarded  Assessment & Plan:   See Encounters Tab for problem based charting.  Patient discussed with Dr. Evette Doffing

## 2021-06-04 NOTE — Progress Notes (Signed)
..  Patient is receiving Replacement Medication. Medication: Rituxan (rituximab) Manufacture: [employer] Patient [employer] Approval Dates: Approved from 06/03/2021 until indefinitely. ID: [id number] Reason: Self Pay First DOS: 06/05/2021. Marland KitchenJuan Solomon, CPhT IV Drug Replacement Specialist [place] Phone: [phone]

## 2021-06-04 NOTE — Progress Notes (Signed)
Called pt to introduce myself as his Arboriculturist.  Pt is uninsured so copay assistance isn't available for him.  I informed him of the [employer] and went over what it covers.  Pt would like to apply and since he's not working he will provide a letter of support on 06/05/21.  Once received I will approve him for the grant.

## 2021-06-04 NOTE — Assessment & Plan Note (Signed)
Depression, PHQ-9: Based on the patients  [place] Visit from 06/03/2021 in [place]  PHQ-9 Total Score 17      score we have suggest moderate to severe depression. Patient denies feeling depressed. It has repeat appointment in one week and will revisit then. I expect with new diagnosis of cancer he would benefit from meeting with our counselor.

## 2021-06-04 NOTE — Progress Notes (Signed)
Internal Medicine Clinic Attending  I saw and evaluated the patient.  I personally confirmed the key portions of the history and exam documented by Dr. Court Joy and I reviewed pertinent patient test results.  The assessment, diagnosis, and plan were formulated together and I agree with the documentation in the resident's note.   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 He is to start ibrutinib and rituximab later this week for treatment. He is here today for progressive very tender left sided gynecomastia, likely a side effect of spironolactone. There are no skin changes to suggest cellulitis, on ultrasound there is no abscess, just swollen soft tissue beneath the nipple. There is also some tender left axillary lymphadenopathy which may be related to the gynecomastia, or to the lymphoma. We will hold the spironolactone at this time. If BP allows in the future, can try to change to eplerenone.

## 2021-06-04 NOTE — Assessment & Plan Note (Signed)
Connor Solomon presents for evaluation of unilateral gynecomastia of his left breast.  Left breast is enlarged and painful to palpation. Enlargement is centrally located around areola. Skin is nonerythematous and I do not believe this is infection.  Dr. Evette Doffing ,attending, assisted evaluation with bedside ultrasound. No underlying abscess.  Assessment/Plan: Drug induced gynecomastia. Stop Spirolactone today, patient asked to follow up in one week to evaluate for resolution. He is having sever pain from the glandular enlargement.  - Stop Spirolactone - oxyCODONE-acetaminophen (PERCOCET) 5-325 MG tablet; Take 1 tablet by mouth every 6 (six) hours as needed for severe pain.  Dispense: 20 tablet; Refill: 0

## 2021-06-04 NOTE — Assessment & Plan Note (Signed)
Patient has requested refill on percocet.He was prescribed Percocet while it was unclear if pain was associated with his suspected cancer.  He has a diagnosis of Waldenstrom macroglobulinemia and scheduled to start treatment Friday.  No bone lesions have been identified. His chronic pain is from osteoarthritis in his back ,shoulder, and knees from history of manual labor. We discussed better treatment options. He is having side effect from spirolactone today and percocet refilled for this reason. Given he is currently having a side effect from a medication we will not start a new medication today. In the future we discussed starting Duloxetine to help with chronic pain.

## 2021-06-04 NOTE — Progress Notes (Addendum)
[place] CSW Progress Notes  Two calls from patient reporting financial distress, wants resources.  CSW [place] referred him to [employer] yesterday, message sent to L White to expect him to want to speak w her.  He lives w girlfriend who is not working, he is also unable to work.  Has received some assistance from Heart/Vascular CSW.  Has applied for disability through [facility], encouraged him to follow up on status of his application, provided their contact information.  He does get Physicist, medical.  CSW will refer to [employer] small grant program on patient behalf, application successfully submitted.    Edwyna Shell, LCSW Clinical Social Worker Phone:  [phone]

## 2021-06-05 ENCOUNTER — Inpatient Hospital Stay: Payer: Medicaid Other

## 2021-06-05 ENCOUNTER — Encounter: Payer: Self-pay | Admitting: Hematology and Oncology

## 2021-06-05 ENCOUNTER — Other Ambulatory Visit (HOSPITAL_COMMUNITY): Payer: Self-pay

## 2021-06-05 ENCOUNTER — Inpatient Hospital Stay (HOSPITAL_BASED_OUTPATIENT_CLINIC_OR_DEPARTMENT_OTHER): Payer: Medicaid Other | Admitting: Hematology and Oncology

## 2021-06-05 ENCOUNTER — Other Ambulatory Visit: Payer: Self-pay

## 2021-06-05 ENCOUNTER — Other Ambulatory Visit: Payer: Self-pay | Admitting: Hematology and Oncology

## 2021-06-05 VITALS — BP 107/61 | HR 77 | Temp 97.7°F | Resp 18 | Ht 68.0 in | Wt 147.5 lb

## 2021-06-05 VITALS — BP 107/59 | HR 94 | Temp 97.5°F | Resp 20

## 2021-06-05 DIAGNOSIS — R5383 Other fatigue: Secondary | ICD-10-CM | POA: Diagnosis not present

## 2021-06-05 DIAGNOSIS — Z5941 Food insecurity: Secondary | ICD-10-CM | POA: Diagnosis not present

## 2021-06-05 DIAGNOSIS — I700 Atherosclerosis of aorta: Secondary | ICD-10-CM | POA: Diagnosis not present

## 2021-06-05 DIAGNOSIS — I110 Hypertensive heart disease with heart failure: Secondary | ICD-10-CM | POA: Diagnosis not present

## 2021-06-05 DIAGNOSIS — C880 Waldenstrom macroglobulinemia: Secondary | ICD-10-CM | POA: Diagnosis not present

## 2021-06-05 DIAGNOSIS — Z8249 Family history of ischemic heart disease and other diseases of the circulatory system: Secondary | ICD-10-CM | POA: Diagnosis not present

## 2021-06-05 DIAGNOSIS — Z596 Low income: Secondary | ICD-10-CM | POA: Diagnosis not present

## 2021-06-05 DIAGNOSIS — I2510 Atherosclerotic heart disease of native coronary artery without angina pectoris: Secondary | ICD-10-CM | POA: Diagnosis not present

## 2021-06-05 DIAGNOSIS — I10 Essential (primary) hypertension: Secondary | ICD-10-CM | POA: Diagnosis not present

## 2021-06-05 DIAGNOSIS — R590 Localized enlarged lymph nodes: Secondary | ICD-10-CM | POA: Diagnosis not present

## 2021-06-05 DIAGNOSIS — Z87891 Personal history of nicotine dependence: Secondary | ICD-10-CM | POA: Diagnosis not present

## 2021-06-05 DIAGNOSIS — Z79899 Other long term (current) drug therapy: Secondary | ICD-10-CM | POA: Diagnosis not present

## 2021-06-05 DIAGNOSIS — R110 Nausea: Secondary | ICD-10-CM | POA: Diagnosis not present

## 2021-06-05 DIAGNOSIS — D649 Anemia, unspecified: Secondary | ICD-10-CM | POA: Diagnosis not present

## 2021-06-05 DIAGNOSIS — D472 Monoclonal gammopathy: Secondary | ICD-10-CM | POA: Diagnosis not present

## 2021-06-05 DIAGNOSIS — I5021 Acute systolic (congestive) heart failure: Secondary | ICD-10-CM | POA: Diagnosis not present

## 2021-06-05 DIAGNOSIS — Z5112 Encounter for antineoplastic immunotherapy: Secondary | ICD-10-CM | POA: Diagnosis not present

## 2021-06-05 LAB — HEPATITIS B SURFACE ANTIGEN: Hepatitis B Surface Ag: NONREACTIVE

## 2021-06-05 LAB — CMP (CANCER CENTER ONLY)
ALT: 12 U/L (ref 0–44)
AST: 11 U/L — ABNORMAL LOW (ref 15–41)
Albumin: 2.6 g/dL — ABNORMAL LOW (ref 3.5–5.0)
Alkaline Phosphatase: 97 U/L (ref 38–126)
Anion gap: 10 (ref 5–15)
BUN: 10 mg/dL (ref 6–20)
CO2: 23 mmol/L (ref 22–32)
Calcium: 9.2 mg/dL (ref 8.9–10.3)
Chloride: 98 mmol/L (ref 98–111)
Creatinine: 0.86 mg/dL (ref 0.61–1.24)
GFR, Estimated: >60 mL/min (ref >60)
Glucose, Bld: 107 mg/dL — ABNORMAL HIGH (ref 70–99)
Potassium: 4.2 mmol/L (ref 3.5–5.1)
Sodium: 131 mmol/L — ABNORMAL LOW (ref 135–145)
Total Bilirubin: 0.5 mg/dL (ref 0.3–1.2)
Total Protein: 7.2 g/dL (ref 6.5–8.1)

## 2021-06-05 LAB — CBC WITH DIFFERENTIAL (CANCER CENTER ONLY)
Abs Immature Granulocytes: 0.06 10*3/uL (ref 0.00–0.07)
Basophils Absolute: 0 10*3/uL (ref 0.0–0.1)
Basophils Relative: 1 %
Eosinophils Absolute: 0.4 10*3/uL (ref 0.0–0.5)
Eosinophils Relative: 5 %
HCT: 25.8 % — ABNORMAL LOW (ref 39.0–52.0)
Hemoglobin: 8.2 g/dL — ABNORMAL LOW (ref 13.0–17.0)
Immature Granulocytes: 1 %
Lymphocytes Relative: 19 %
Lymphs Abs: 1.5 10*3/uL (ref 0.7–4.0)
MCH: 23.6 pg — ABNORMAL LOW (ref 26.0–34.0)
MCHC: 31.8 g/dL (ref 30.0–36.0)
MCV: 74.4 fL — ABNORMAL LOW (ref 80.0–100.0)
Monocytes Absolute: 0.8 10*3/uL (ref 0.1–1.0)
Monocytes Relative: 11 %
Neutro Abs: 4.9 10*3/uL (ref 1.7–7.7)
Neutrophils Relative %: 63 %
Platelet Count: 468 10*3/uL — ABNORMAL HIGH (ref 150–400)
RBC: 3.47 MIL/uL — ABNORMAL LOW (ref 4.22–5.81)
RDW: 17.3 % — ABNORMAL HIGH (ref 11.5–15.5)
WBC Count: 7.8 10*3/uL (ref 4.0–10.5)
nRBC: 0 % (ref 0.0–0.2)

## 2021-06-05 LAB — HEPATITIS B SURFACE ANTIBODY,QUALITATIVE: Hep B S Ab: NONREACTIVE

## 2021-06-05 LAB — LACTATE DEHYDROGENASE: LDH: 83 U/L — ABNORMAL LOW (ref 98–192)

## 2021-06-05 LAB — HEPATITIS B CORE ANTIBODY, TOTAL: Hep B Core Total Ab: NONREACTIVE

## 2021-06-05 MED ORDER — SODIUM CHLORIDE 0.9 % IV SOLN
375.0000 mg/m2 | Freq: Once | INTRAVENOUS | Status: AC
  Administered 2021-06-05: 700 mg via INTRAVENOUS
  Filled 2021-06-05: qty 50

## 2021-06-05 MED ORDER — METHYLPREDNISOLONE SODIUM SUCC 125 MG IJ SOLR
125.0000 mg | Freq: Once | INTRAMUSCULAR | Status: AC | PRN
  Administered 2021-06-05: 125 mg via INTRAVENOUS

## 2021-06-05 MED ORDER — ACETAMINOPHEN 325 MG PO TABS
650.0000 mg | ORAL_TABLET | Freq: Once | ORAL | Status: AC
  Administered 2021-06-05: 650 mg via ORAL
  Filled 2021-06-05: qty 2

## 2021-06-05 MED ORDER — SODIUM CHLORIDE 0.9 % IV SOLN: Freq: Once | INTRAVENOUS | Status: AC

## 2021-06-05 MED ORDER — SODIUM CHLORIDE 0.9 % IV SOLN: Freq: Once | INTRAVENOUS | Status: DC | PRN

## 2021-06-05 MED ORDER — FAMOTIDINE 20 MG IN NS 100 ML IVPB
20.0000 mg | Freq: Once | INTRAVENOUS | Status: AC | PRN
  Administered 2021-06-05: 20 mg via INTRAVENOUS

## 2021-06-05 MED ORDER — DIPHENHYDRAMINE HCL 25 MG PO CAPS
50.0000 mg | ORAL_CAPSULE | Freq: Once | ORAL | Status: AC
  Administered 2021-06-05: 50 mg via ORAL
  Filled 2021-06-05: qty 2

## 2021-06-05 MED ORDER — ALLOPURINOL 300 MG PO TABS
300.0000 mg | ORAL_TABLET | Freq: Every day | ORAL | 1 refills | Status: DC
  Filled 2021-06-05: qty 90, 90d supply, fill #0
  Filled 2021-09-10: qty 90, 90d supply, fill #1

## 2021-06-05 NOTE — Progress Notes (Signed)
Hypersensitivity Reaction note  Date of event: 06/05/21 Time of event: 1335 Generic name of drug involved: rituximab Name of provider notified of the hypersensitivity reaction: Dr. Lorenso Courier Was agent that likely caused hypersensitivity reaction added to Allergies List within EMR? yes Chain of events including reaction signs/symptoms, treatment administered, and outcome (e.g., drug resumed; drug discontinued; sent to Emergency Department; etc.)  First time rituxan started at 1115, titrating up 50mg /hr every 30 minutes. Pt tolerated well until 2 hours in at 250mg /hr when pt c/o "chest tightness," dizziness, cough and chills. Vital signs stable except slightly lower BP 97/50. Stopped infusion, began fluid bolus. MD at chairside. Administered pepcid 20mg  and solumedrol 62.5mg . 30 minutes later restarted rituxan. Pt immediately had significant rigors. Stopped infusion and notified MD. Pt will rechallenge at next weeks infusion appointment. Pt stable at discharge.  Gillian Shields, RN 06/05/2021 4:23 PM

## 2021-06-05 NOTE — Progress Notes (Signed)
[place] [phone]   [phone]  PROGRESS NOTE  Patient Care Team: Delene Ruffini, MD as PCP - General (Internal Medicine)  Hematological/Oncological History # Waldenstrom's Macroglobulinemia # IgM Monoclonal Gammopathy 04/07/2021: CT A/p showed mildly enlarged retroperitoneal lymph nodes and bilateral inguinal lymph nodes.  04/08/2021: IgM Kappa M protein 1.2 04/09/2021: WBC 11.1, Hgb 9.5, MCV 75.4, Plt 525 04/20/2021: Kappa 977, Lambda 7.8, K/L ratio 125.28. Serum viscosity 1.8 05/06/2021: Establish care with Dr. 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 male with medical history significant for Waldenstrom's macroglobulinemia who presents for a follow up visit. The patient's last visit was on 05/22/2021. In the interim since the last visit he has completed chemotherapy education and received his chemotherapy pills.  On exam today Connor Solomon notes his health is currently at baseline.  He notes that his primary care provider stopped spironolactone due to breast enlargement.  He is also having enlargement of the lymph nodes under his armpits.  He is requesting a handicap sticker today.  He otherwise had no major questions or concerns.  He is ready to get started with treatment and believes that most of his questions were answered chemotherapy education.  He notes he is not having any issues with vision changes, shortness of breath, or chest pain.  A full 10 point ROS is listed below.  The bulk of our discussion today focused on assuring he had everything required for treatment moving forward.  The patient voices understanding of the plan.  MEDICAL HISTORY:  Past Medical History:  Diagnosis Date   Acute heart failure ([place])  04/08/2021   Acute HFrEF (heart failure with reduced ejection fraction) ([place]) 04/07/2021   Arthritis    hands, elbows bilaterally   Cancer ([facility])    Full dentures    Hypertension    Pt states he doen not have HTN and has never been treated for HTN   Kidney stone on left side last stone 4-5 yrs ago   recurrent (3 episodes)   Panic disorder    pt states has resolved   Syncope    resolved- was related to panic attacks    SURGICAL HISTORY: Past Surgical History:  Procedure Laterality Date   CARPAL METACARPAL FUSION WITH DISTAL RADIAL BONE GRAFT Left 05/30/2013   Procedure: LEFT THUMB METACARPAL JOINT FUSION;  Surgeon: Schuyler Amor, MD;  Location: [place];  Service: Orthopedics;  Laterality: Left;   ESOPHAGOGASTRODUODENOSCOPY N/A 02/12/2014   Procedure: ESOPHAGOGASTRODUODENOSCOPY (EGD);  Surgeon: Jerene Bears, MD;  Location: [facility] ENDOSCOPY;  Service: Endoscopy;  Laterality: N/A;   FOREIGN BODY REMOVAL N/A 02/12/2014   Procedure: FOREIGN BODY REMOVAL;  Surgeon: Jerene Bears, MD;  Location: [place];  Service: Endoscopy;  Laterality: N/A;   OPEN REDUCTION INTERNAL FIXATION (ORIF) DISTAL RADIAL FRACTURE Left 11/29/2012   Procedure: OPEN REDUCTION INTERNAL FIXATION (ORIF) DISTAL RADIAL FRACTURE;  Surgeon: Schuyler Amor, MD;  Location: [place];  Service: Orthopedics;  Laterality: Left;  LEFT DISTAL RADIUS OSTEOTOMY WITH BONE GRAFT   RIGHT/LEFT HEART CATH AND CORONARY ANGIOGRAPHY N/A 04/09/2021   Procedure: RIGHT/LEFT HEART CATH AND CORONARY ANGIOGRAPHY;  Surgeon: Jolaine Artist, MD;  Location: [facility];  Service: Cardiovascular;  Laterality: N/A;   TENDON REPAIR Left 02/07/2013   Procedure:  LEFT EXTENSOR INDICIS PROPRIUS  TO EXTENSOR POLLICIS LONGUS TENDON TRANSFER;  Surgeon: Schuyler Amor, MD;  Location: [place];  Service: Orthopedics;  Laterality: Left;   WISDOM TOOTH EXTRACTION     WRIST SURGERY     fx only     SOCIAL HISTORY: Social History   Socioeconomic History   Marital status: Single    Spouse name: Not on file   Number of children: Not on file   Years of education: Not on file   Highest education level: Not on file  Occupational History   Not on file  Tobacco Use   Smoking status: Former    Types: Cigars    Quit date: 10/11/2020    Years since quitting: 0.6   Smokeless tobacco: Never   Tobacco comments:    smokes 1 cigar a week  Substance and Sexual Activity   Alcohol use: No   Drug use: No   Sexual activity: Not on file  Other Topics Concern   Not on file  Social History Narrative   Not on file   Social Determinants of Health   Financial Resource Strain: High Risk   Difficulty of Paying Living Expenses: Hard  Food Insecurity: Food Insecurity Present   Worried About Running Out of Food in the Last Year: Sometimes true   Ran Out of Food in the Last Year: Sometimes true  Transportation Needs: No Transportation Needs   Lack of Transportation (Medical): No   Lack of Transportation (Non-Medical): No  Physical Activity: Not on file  Stress: Not on file  Social Connections: Not on file  Intimate Partner Violence: Not on file    FAMILY HISTORY: Family History  Problem Relation Age of Onset   Heart attack Father    Sudden Cardiac Death Father 98   Diabetes Neg Hx    Hyperlipidemia Neg Hx    Hypertension Neg Hx     ALLERGIES:  is allergic to hydrocodone.  MEDICATIONS:  Current Outpatient Medications  Medication Sig Dispense Refill   allopurinol (ZYLOPRIM) 300 MG tablet Take 1 tablet (300 mg total) by mouth daily. 90 tablet 1   acetaminophen (TYLENOL) 500 MG tablet Take 500 mg by mouth every 6 (six) hours as needed.     albuterol (VENTOLIN HFA) 108 (90 Base) MCG/ACT inhaler Inhale 2 puffs into the lungs every 6 (six) hours as needed for wheezing or shortness of breath. 8.5 g 2   carvedilol (COREG) 3.125 MG tablet Take 1 tablet (3.125 mg total) by mouth 2 (two)  times daily. 68 tablet 2   ferrous sulfate 325 (65 FE) MG tablet Take 325 mg by mouth daily with breakfast.     furosemide (LASIX) 20 MG tablet Take 1 tablet (20 mg total) by mouth as needed for swelling. (Patient not taking: No sig reported) 100 tablet 2   furosemide (LASIX) 20 MG tablet Take 1 tablet (20 mg total) by mouth daily as needed for swelling 100 tablet 2   ibrutinib 420 MG TABS Take 420 mg by mouth daily. 30 tablet 2   losartan (COZAAR) 25 MG tablet Take 0.5 tablets (12.5 mg total) by mouth at bedtime. 17 tablet 2   oxyCODONE-acetaminophen (PERCOCET) 5-325 MG tablet Take 1 tablet by mouth every 6 (six) hours as needed for severe pain. 20 tablet 0   No current facility-administered medications for this visit.    REVIEW OF SYSTEMS:   Constitutional: ( - ) fevers, ( - )  chills , ( - )  night sweats Eyes: ( - ) blurriness of vision, ( - ) double vision, ( - ) watery eyes Ears, nose, mouth, throat, and face: ( - ) mucositis, ( - ) sore throat Respiratory: ( - ) cough, ( - ) dyspnea, ( - ) wheezes Cardiovascular: ( - ) palpitation, ( - ) chest discomfort, ( - ) lower extremity swelling Gastrointestinal:  ( - ) nausea, ( - ) heartburn, ( - ) change in bowel habits Skin: ( - ) abnormal skin rashes Lymphatics: ( - ) new lymphadenopathy, ( - ) easy bruising Neurological: ( - ) numbness, ( - ) tingling, ( - ) new weaknesses Behavioral/Psych: ( - ) mood change, ( - ) new changes  All other systems were reviewed with the patient and are negative.  PHYSICAL EXAMINATION: ECOG PERFORMANCE STATUS: 1 - Symptomatic but completely ambulatory  Vitals:   06/05/21 0841  BP: 107/61  Pulse: 77  Resp: 18  Temp: 97.7 F (36.5 C)  SpO2: 100%   Filed Weights   06/05/21 0841  Weight: 147 lb 8 oz (66.9 kg)    GENERAL: Well-appearing middle-aged Caucasian male, alert, no distress and comfortable SKIN: skin color, texture, turgor are normal, no rashes or significant lesions EYES: conjunctiva  are pink and non-injected, sclera clear LUNGS: clear to auscultation and percussion with normal breathing effort HEART: regular rate & rhythm and no murmurs and no lower extremity edema  PSYCH: alert & oriented x 3, fluent speech NEURO: no focal motor/sensory deficits  LABORATORY DATA:  I have reviewed the data as listed CBC Latest Ref Rng & Units 06/05/2021 05/22/2021 2021-06-02  WBC 4.0 - 10.5 K/uL 7.8 7.4 6.4  Hemoglobin 13.0 - 17.0 g/dL 8.2(L) 8.2(L) 7.4(L)  Hematocrit 39.0 - 52.0 % 25.8(L) 25.9(L) 24.3(L)  Platelets 150 - 400 K/uL 468(H) 387 432(H)    CMP Latest Ref Rng & Units 06/05/2021 05/08/2021 05/06/2021  Glucose 70 - 99 mg/dL 107(H) 98 88  BUN 6 - 20 mg/dL $Remove'10 9 9  'teuSJvP$ Creatinine 0.61 - 1.24 mg/dL 0.86 0.82 0.86  Sodium 135 - 145 mmol/L 131(L) 133(L) 136  Potassium 3.5 - 5.1 mmol/L 4.2 4.2 4.5  Chloride 98 - 111 mmol/L 98 101 101  CO2 22 - 32 mmol/L $RemoveB'23 22 25  'KCFmKpcS$ Calcium 8.9 - 10.3 mg/dL 9.2 9.0 9.8  Total Protein 6.5 - 8.1 g/dL 7.2 - 7.9  Total Bilirubin 0.3 - 1.2 mg/dL 0.5 - 0.5  Alkaline Phos 38 - 126 U/L 97 - 92  AST 15 - 41 U/L 11(L) - 10(L)  ALT 0 - 44 U/L 12 - 7    Lab Results  Component Value Date   MPROTEIN 1.2 (H) 04/08/2021   Lab Results  Component Value Date   KAPLAMBRATIO 875.18 (H) 05/12/2021    RADIOGRAPHIC STUDIES: CT Biopsy  Result Date: 06-02-2021 INDICATION: ANEMIA, MYELOMA WORKUP, MONOCLONAL GAMMOPATHY EXAM: CT GUIDED RIGHT ILIAC BONE MARROW ASPIRATION AND CORE BIOPSY Date:  06/02/2021 06/02/21 10:58 am Radiologist:  Jerilynn Mages. Daryll Brod, MD Guidance:  CT FLUOROSCOPY TIME:  Fluoroscopy Time: None. MEDICATIONS: 1% lidocaine local ANESTHESIA/SEDATION: 2.0 mg IV Versed; 100 mcg IV Fentanyl Moderate Sedation Time:  10 minutes The patient was continuously monitored during the procedure by the interventional radiology nurse under my direct supervision. CONTRAST:  None. COMPLICATIONS: None PROCEDURE: Informed consent was obtained from the patient following explanation of  the procedure, risks, benefits and alternatives. The patient understands, agrees and consents for the procedure. All questions were addressed. A time out was  performed. The patient was positioned prone and non-contrast localization CT was performed of the pelvis to demonstrate the iliac marrow spaces. Maximal barrier sterile technique utilized including caps, mask, sterile gowns, sterile gloves, large sterile drape, hand hygiene, and Betadine prep. Under sterile conditions and local anesthesia, an 11 gauge coaxial bone biopsy needle was advanced into the right iliac marrow space. Needle position was confirmed with CT imaging. Initially, bone marrow aspiration was performed. Next, the 11 gauge outer cannula was utilized to obtain a right iliac bone marrow core biopsy. Needle was removed. Hemostasis was obtained with compression. The patient tolerated the procedure well. Samples were prepared with the cytotechnologist. No immediate complications. IMPRESSION: CT guided right iliac bone marrow aspiration and core biopsy. Electronically Signed   By: Jerilynn Mages.  Shick M.D.   On: 05/19/2021 11:41   DG Bone Survey Met  Result Date: 05/14/2021 CLINICAL DATA:  MGUS, assess for lytic lesions. IgM monoclonal gammopathy of uncertain significance. EXAM: METASTATIC BONE SURVEY COMPARISON:  Chest CT yesterday.  Chest abdomen pelvis CT 04/07/2021 FINDINGS: No evidence of focal lytic or blastic lesions throughout the axial or appendicular skeleton. No periosteal reaction or bone destruction. Remote healed fracture of the right proximal humerus. Remote healed fracture of the distal left radius with plate and screw fixation. There is degenerative change throughout the cervical, thoracic, and lumbar spine. No compression deformity. The lungs are clear. Heart is normal in size. Normal abdominal bowel gas pattern. IMPRESSION: 1. No focal lytic or blastic lesions throughout the axial or appendicular skeleton. 2. Degenerative change throughout  the spine. Electronically Signed   By: Keith Rake M.D.   On: 05/14/2021 19:18   CT BONE MARROW BIOPSY & ASPIRATION  Result Date: 05/19/2021 INDICATION: ANEMIA, MYELOMA WORKUP, MONOCLONAL GAMMOPATHY EXAM: CT GUIDED RIGHT ILIAC BONE MARROW ASPIRATION AND CORE BIOPSY Date:  05/19/2021 05/19/2021 10:58 am Radiologist:  M. Daryll Brod, MD Guidance:  CT FLUOROSCOPY TIME:  Fluoroscopy Time: None. MEDICATIONS: 1% lidocaine local ANESTHESIA/SEDATION: 2.0 mg IV Versed; 100 mcg IV Fentanyl Moderate Sedation Time:  10 minutes The patient was continuously monitored during the procedure by the interventional radiology nurse under my direct supervision. CONTRAST:  None. COMPLICATIONS: None PROCEDURE: Informed consent was obtained from the patient following explanation of the procedure, risks, benefits and alternatives. The patient understands, agrees and consents for the procedure. All questions were addressed. A time out was performed. The patient was positioned prone and non-contrast localization CT was performed of the pelvis to demonstrate the iliac marrow spaces. Maximal barrier sterile technique utilized including caps, mask, sterile gowns, sterile gloves, large sterile drape, hand hygiene, and Betadine prep. Under sterile conditions and local anesthesia, an 11 gauge coaxial bone biopsy needle was advanced into the right iliac marrow space. Needle position was confirmed with CT imaging. Initially, bone marrow aspiration was performed. Next, the 11 gauge outer cannula was utilized to obtain a right iliac bone marrow core biopsy. Needle was removed. Hemostasis was obtained with compression. The patient tolerated the procedure well. Samples were prepared with the cytotechnologist. No immediate complications. IMPRESSION: CT guided right iliac bone marrow aspiration and core biopsy. Electronically Signed   By: Jerilynn Mages.  Shick M.D.   On: 05/19/2021 11:41   CT Super D Chest Wo Contrast  Result Date: 05/12/2021 CLINICAL DATA:   Right lung nodules, shortness of breath EXAM: CT CHEST WITHOUT CONTRAST TECHNIQUE: Multidetector CT imaging of the chest was performed using thin slice collimation for electromagnetic bronchoscopy planning purposes, without intravenous contrast. COMPARISON:  CT  chest abdomen pelvis, 05/31/2020, CT chest angiogram, 04/07/2021 FINDINGS: Cardiovascular: Aortic atherosclerosis. Normal heart size. Left and right coronary artery calcifications. No pericardial effusion. Mediastinum/Nodes: Unchanged prominent, nonspecific bilateral axillary lymph nodes (series 2, image 35). No enlarged mediastinal or hilar lymph nodes. Thyroid gland, trachea, and esophagus demonstrate no significant findings. Lungs/Pleura: Occasional small pulmonary nodules are present bilaterally, the largest a 6 mm nodule of the anterior right middle lobe (series 8, image 102). These are stable compared to prior examinations and are benign. Background of very fine centrilobular pulmonary nodules, most concentrated in the lung apices. Nonspecific ill-defined infectious or inflammatory ground-glass opacity of the anterior left upper lobe (series 8, image 71). No pleural effusion or pneumothorax. Upper Abdomen: No acute abnormality. Musculoskeletal: No chest wall mass or suspicious bone lesions identified. IMPRESSION: 1. Occasional small pulmonary nodules are present bilaterally, measuring 6 mm and smaller, stable and benign. No further follow-up or characterization is required. 2. Background of very fine centrilobular pulmonary nodules, most concentrated in the lung apices, most commonly seen in smoking-related respiratory bronchiolitis. 3. Coronary artery disease. Aortic Atherosclerosis (ICD10-I70.0). Electronically Signed   By: Eddie Candle M.D.   On: 05/12/2021 16:34    ASSESSMENT & PLAN Connor Solomon 60 y.o. male with medical history significant for Waldenstrom's macroglobulinemia who presents for a follow up visit.   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  There is no clear evidence of hyperviscosity syndrome at this time.  At this time we will plan to proceed with ibrutinib and rituximab therapy.  1 could also consider Bendamustine and rituximab therapy but given the patient's degree of anemia I would prefer pursuing ibrutinib and rituximab at this time.  Additionally ibrutinib/rituximab are category 1 recommendations per the NCCN guidelines.  The treatment will consist of ibrutinib 420 mg p.o. daily with rituximab on weeks 1 through 4 as well as weeks 27 through 30.  Previously we discussed the risks and benefits of therapy including (but not limited to) risk of bleeding, fatigue, atypical infections, and cardiac arrhythmias.  The patient voices understanding of this plan moving forward.  # Waldenstrom's Macroglobulinemia # IgM Monoclonal Gammopathy -- Findings at this time are most consistent with Waldenstrom macroglobulinemia.  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 --No clear signs of hyperviscosity syndrome at this time. 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  -- At this time the patient's hemoglobin is most consistent with anemia due to lymphomatous involvement of bone marrow --There does appear to be a component of iron deficiency anemia as well.  Recommend the patient continue p.o. iron therapy --Continue to monitor.  #Supportive Care -- chemotherapy education complete -- port placement not required -- allopurinol $RemoveBefore'300mg'MKPfAGvuphQxn$  PO daily for TLS prophylaxis   No orders of the defined types were placed in this encounter.  All questions were answered. The patient knows to call the clinic  with any problems, questions or concerns.  A total of more than 30 minutes were spent on this encounter with face-to-face time and non-face-to-face time, including preparing to see the patient, ordering tests and/or medications, counseling the patient and coordination of care as outlined above.   Ledell Peoples, MD Department of Hematology/Oncology [facility]  at [facility] Phone: [phone] Pager: [phone] Email: [email or web address]  06/05/2021 9:47 AM  Dimopoulos MA, Rennie Natter, [place], Mahe B, Herbaux C, Tam C, Orsucci L, [place] ML, Matous JV, [place] C, Kastritis E, [place], Li J, Salman Z, Graef T, Buske C; iNNOVATE Study Group and the [employer] for [employer]. Phase 3 Trial of Ibrutinib plus Rituximab in Waldenstrm's Macroglobulinemia. Alison Stalling J Med. 2018 Jun 21;378(25):2399-2410.   --At 30 months, the progression-free survival rate was 82% with ibrutinib-rituximab versus 28% with placebo-rituximab (hazard ratio for progression or death, 0.20; P<0.001).

## 2021-06-05 NOTE — Patient Instructions (Signed)
[facility] ONCOLOGY  Discharge Instructions: Thank you for choosing [facility] to provide your oncology and hematology care.   If you have a lab appointment with the [facility], please go directly to the [facility] and check in at the registration area.   Wear comfortable clothing and clothing appropriate for easy access to any Portacath or PICC line.   We strive to give you quality time with your provider. You may need to reschedule your appointment if you arrive late (15 or more minutes).  Arriving late affects you and other patients whose appointments are after yours.  Also, if you miss three or more appointments without notifying the office, you may be dismissed from the clinic at the provider's discretion.      For prescription refill requests, have your pharmacy contact our office and allow 72 hours for refills to be completed.    Today you received the following chemotherapy and/or immunotherapy agents rituxan      To help prevent nausea and vomiting after your treatment, we encourage you to take your nausea medication as directed.  BELOW ARE SYMPTOMS THAT SHOULD BE REPORTED IMMEDIATELY: *FEVER GREATER THAN 100.4 F (38 C) OR HIGHER *CHILLS OR SWEATING *NAUSEA AND VOMITING THAT IS NOT CONTROLLED WITH YOUR NAUSEA MEDICATION *UNUSUAL SHORTNESS OF BREATH *UNUSUAL BRUISING OR BLEEDING *URINARY PROBLEMS (pain or burning when urinating, or frequent urination) *BOWEL PROBLEMS (unusual diarrhea, constipation, pain near the anus) TENDERNESS IN MOUTH AND THROAT WITH OR WITHOUT PRESENCE OF ULCERS (sore throat, sores in mouth, or a toothache) UNUSUAL RASH, SWELLING OR PAIN  UNUSUAL VAGINAL DISCHARGE OR ITCHING   Items with * indicate a potential emergency and should be followed up as soon as possible or go to the Emergency Department if any problems should occur.  Please show the CHEMOTHERAPY ALERT CARD or IMMUNOTHERAPY ALERT CARD at check-in to the  Emergency Department and triage nurse.  Should you have questions after your visit or need to cancel or reschedule your appointment, please contact [facility]  Dept: [phone]  and follow the prompts.  Office hours are 8:00 a.m. to 4:30 p.m. Monday - Friday. Please note that voicemails left after 4:00 p.m. may not be returned until the following business day.  We are closed weekends and major holidays. You have access to a nurse at all times for urgent questions. Please call the main number to the clinic Dept: [phone] and follow the prompts.   For any non-urgent questions, you may also contact your provider using MyChart. We now offer e-Visits for anyone 51 and older to request care online for non-urgent symptoms. For details visit [email or web address].   Also download the MyChart app! Go to the app store, search "MyChart", open the app, select [facility], and log in with your MyChart username and password.  Due to Covid, a mask is required upon entering the hospital/clinic. If you do not have a mask, one will be given to you upon arrival. For doctor visits, patients may have 1 support person aged 77 or older with them. For treatment visits, patients cannot have anyone with them due to current Covid guidelines and our immunocompromised population.

## 2021-06-05 NOTE — Progress Notes (Signed)
Pt is approved for the $1000 [employer] grant.  

## 2021-06-05 NOTE — Progress Notes (Signed)
OK to proceed with pending hep panel per Dr. Lorenso Courier.  Kennith Center, Pharm.D., CPP 06/05/2021@10 :26 AM

## 2021-06-08 ENCOUNTER — Other Ambulatory Visit (HOSPITAL_COMMUNITY): Payer: Self-pay

## 2021-06-10 ENCOUNTER — Other Ambulatory Visit: Payer: Self-pay

## 2021-06-10 ENCOUNTER — Other Ambulatory Visit (HOSPITAL_COMMUNITY): Payer: Self-pay

## 2021-06-10 ENCOUNTER — Encounter: Payer: Self-pay | Admitting: Internal Medicine

## 2021-06-10 ENCOUNTER — Ambulatory Visit (INDEPENDENT_AMBULATORY_CARE_PROVIDER_SITE_OTHER): Payer: Self-pay | Admitting: Internal Medicine

## 2021-06-10 ENCOUNTER — Encounter: Payer: Self-pay | Admitting: Hematology and Oncology

## 2021-06-10 DIAGNOSIS — N62 Hypertrophy of breast: Secondary | ICD-10-CM

## 2021-06-10 DIAGNOSIS — Z1331 Encounter for screening for depression: Secondary | ICD-10-CM

## 2021-06-10 DIAGNOSIS — R0602 Shortness of breath: Secondary | ICD-10-CM

## 2021-06-10 MED ORDER — OXYCODONE-ACETAMINOPHEN 5-325 MG PO TABS
1.0000 | ORAL_TABLET | Freq: Four times a day (QID) | ORAL | 0 refills | Status: DC | PRN
  Filled 2021-06-10: qty 20, 5d supply, fill #0

## 2021-06-10 MED ORDER — ALBUTEROL SULFATE HFA 108 (90 BASE) MCG/ACT IN AERS
2.0000 | INHALATION_SPRAY | Freq: Four times a day (QID) | RESPIRATORY_TRACT | 2 refills | Status: DC | PRN
  Filled 2021-06-10: qty 8.5, 25d supply, fill #0
  Filled 2021-08-25: qty 8.5, 25d supply, fill #1
  Filled 2022-01-02 – 2022-01-18 (×2): qty 6.7, 25d supply, fill #1

## 2021-06-10 NOTE — Progress Notes (Signed)
   CC: gynecomastia  HPI:[name] is a 60 y.o. male who presents for evaluation of gynecomastia. Please see individual problem based A/P for details.  Past Medical History:  Diagnosis Date   Acute heart failure ([place]) 04/08/2021   Acute HFrEF (heart failure with reduced ejection fraction) ([place]) 04/07/2021   Arthritis    hands, elbows bilaterally   Cancer ([place])    Full dentures    Hypertension    Pt states he doen not have HTN and has never been treated for HTN   Kidney stone on left side last stone 4-5 yrs ago   recurrent (3 episodes)   Panic disorder    pt states has resolved   Syncope    resolved- was related to panic attacks   Review of Systems:   Review of Systems  Cardiovascular:  Negative for chest pain and leg swelling.  Musculoskeletal:  Positive for back pain and joint pain.    Physical Exam: Vitals:   06/10/21 1012  BP: 106/66  Pulse: 87  Resp: (!) 24  Temp: 97.9 F (36.6 C)  TempSrc: Oral  SpO2: 100%  Weight: 152 lb 9.6 oz (69.2 kg)  Height: $Remove'5\' 8"'NrQprat$  (1.727 m)   General: nl weight, NAD HEENT: Conjunctiva nl , antiicteric sclera Cardiovascular: Normal rate, regular rhythm.  No murmurs, rubs, or gallops Pulmonary : Equal breath sounds, No wheezes, rales, or rhonchi Chest: Swelling of left breast, mild TTP, no guarding,   Assessment & Plan:   See Encounters Tab for problem based charting.  Patient discussed with Dr. Evette Doffing

## 2021-06-10 NOTE — Patient Instructions (Addendum)
Thank you for trusting me with your care. To recap, today we discussed the following:   Gynecomastia  - oxyCODONE-acetaminophen (PERCOCET) 5-325 MG tablet; Take 1 tablet by mouth every 6 (six) hours as needed for severe pain.  Dispense: 20 tablet; Refill: 0  - Sent prescription for albuterol

## 2021-06-11 ENCOUNTER — Encounter: Payer: Self-pay | Admitting: Internal Medicine

## 2021-06-11 DIAGNOSIS — R0600 Dyspnea, unspecified: Secondary | ICD-10-CM | POA: Insufficient documentation

## 2021-06-11 NOTE — Assessment & Plan Note (Signed)
Pulmonology prescribed albuterol for patient dyspnea. He ask if I will prescribe through our clinic program so medication would be affordable. Prescriptions sent.

## 2021-06-11 NOTE — Assessment & Plan Note (Signed)
Patient denies feelings of hopelessness or lost of interest in things he enjoys today. Recommend monitoring at normal visit intervals.

## 2021-06-11 NOTE — Assessment & Plan Note (Addendum)
Patient here for follow up of gynecomastia. He did develop some pain on the right breast the day after my last evaluation, but since then both sides improving . His left breast still appears swolen, but pain has improved.   Assessment/Plan: Drug induce gynecomastia , improved with stopping spirolactone. Patient denies any worsening signs of heart failure since stopping medication. Continues to have some pain from breast. Will prescribe additional percocet, encouraged patient to take only as needed for moderate- severe pain.

## 2021-06-12 ENCOUNTER — Other Ambulatory Visit: Payer: Self-pay | Admitting: Hematology and Oncology

## 2021-06-12 ENCOUNTER — Inpatient Hospital Stay: Payer: Medicaid Other | Attending: Hematology and Oncology

## 2021-06-12 ENCOUNTER — Other Ambulatory Visit: Payer: Self-pay

## 2021-06-12 ENCOUNTER — Other Ambulatory Visit (HOSPITAL_BASED_OUTPATIENT_CLINIC_OR_DEPARTMENT_OTHER): Payer: Self-pay | Admitting: Hematology and Oncology

## 2021-06-12 VITALS — BP 124/75 | HR 75 | Temp 97.6°F | Resp 18 | Wt 152.8 lb

## 2021-06-12 DIAGNOSIS — R630 Anorexia: Secondary | ICD-10-CM | POA: Diagnosis not present

## 2021-06-12 DIAGNOSIS — Z885 Allergy status to narcotic agent status: Secondary | ICD-10-CM | POA: Insufficient documentation

## 2021-06-12 DIAGNOSIS — R519 Headache, unspecified: Secondary | ICD-10-CM | POA: Diagnosis not present

## 2021-06-12 DIAGNOSIS — R0602 Shortness of breath: Secondary | ICD-10-CM | POA: Diagnosis not present

## 2021-06-12 DIAGNOSIS — I5022 Chronic systolic (congestive) heart failure: Secondary | ICD-10-CM | POA: Insufficient documentation

## 2021-06-12 DIAGNOSIS — C880 Waldenstrom macroglobulinemia: Secondary | ICD-10-CM | POA: Diagnosis not present

## 2021-06-12 DIAGNOSIS — R5383 Other fatigue: Secondary | ICD-10-CM | POA: Insufficient documentation

## 2021-06-12 DIAGNOSIS — I10 Essential (primary) hypertension: Secondary | ICD-10-CM | POA: Diagnosis not present

## 2021-06-12 DIAGNOSIS — R252 Cramp and spasm: Secondary | ICD-10-CM | POA: Insufficient documentation

## 2021-06-12 DIAGNOSIS — Z5112 Encounter for antineoplastic immunotherapy: Secondary | ICD-10-CM | POA: Diagnosis not present

## 2021-06-12 DIAGNOSIS — Z596 Low income: Secondary | ICD-10-CM | POA: Diagnosis not present

## 2021-06-12 DIAGNOSIS — R110 Nausea: Secondary | ICD-10-CM | POA: Diagnosis not present

## 2021-06-12 DIAGNOSIS — Z79899 Other long term (current) drug therapy: Secondary | ICD-10-CM | POA: Insufficient documentation

## 2021-06-12 DIAGNOSIS — R590 Localized enlarged lymph nodes: Secondary | ICD-10-CM | POA: Insufficient documentation

## 2021-06-12 DIAGNOSIS — D472 Monoclonal gammopathy: Secondary | ICD-10-CM | POA: Insufficient documentation

## 2021-06-12 DIAGNOSIS — R058 Other specified cough: Secondary | ICD-10-CM | POA: Insufficient documentation

## 2021-06-12 DIAGNOSIS — Z8249 Family history of ischemic heart disease and other diseases of the circulatory system: Secondary | ICD-10-CM | POA: Insufficient documentation

## 2021-06-12 DIAGNOSIS — Z87891 Personal history of nicotine dependence: Secondary | ICD-10-CM | POA: Insufficient documentation

## 2021-06-12 DIAGNOSIS — R197 Diarrhea, unspecified: Secondary | ICD-10-CM | POA: Insufficient documentation

## 2021-06-12 DIAGNOSIS — Z5941 Food insecurity: Secondary | ICD-10-CM | POA: Insufficient documentation

## 2021-06-12 DIAGNOSIS — D649 Anemia, unspecified: Secondary | ICD-10-CM | POA: Diagnosis not present

## 2021-06-12 LAB — CBC WITH DIFFERENTIAL (CANCER CENTER ONLY)
Abs Immature Granulocytes: 0.04 10*3/uL (ref 0.00–0.07)
Basophils Absolute: 0 10*3/uL (ref 0.0–0.1)
Basophils Relative: 0 %
Eosinophils Absolute: 0.6 10*3/uL — ABNORMAL HIGH (ref 0.0–0.5)
Eosinophils Relative: 7 %
HCT: 25.8 % — ABNORMAL LOW (ref 39.0–52.0)
Hemoglobin: 8.2 g/dL — ABNORMAL LOW (ref 13.0–17.0)
Immature Granulocytes: 1 %
Lymphocytes Relative: 29 %
Lymphs Abs: 2.3 10*3/uL (ref 0.7–4.0)
MCH: 24.4 pg — ABNORMAL LOW (ref 26.0–34.0)
MCHC: 31.8 g/dL (ref 30.0–36.0)
MCV: 76.8 fL — ABNORMAL LOW (ref 80.0–100.0)
Monocytes Absolute: 0.5 10*3/uL (ref 0.1–1.0)
Monocytes Relative: 6 %
Neutro Abs: 4.6 10*3/uL (ref 1.7–7.7)
Neutrophils Relative %: 57 %
Platelet Count: 399 10*3/uL (ref 150–400)
RBC: 3.36 MIL/uL — ABNORMAL LOW (ref 4.22–5.81)
RDW: 19.1 % — ABNORMAL HIGH (ref 11.5–15.5)
WBC Count: 8 10*3/uL (ref 4.0–10.5)
nRBC: 0 % (ref 0.0–0.2)

## 2021-06-12 LAB — LACTATE DEHYDROGENASE: LDH: 109 U/L (ref 98–192)

## 2021-06-12 LAB — CMP (CANCER CENTER ONLY)
ALT: 11 U/L (ref 0–44)
AST: 14 U/L — ABNORMAL LOW (ref 15–41)
Albumin: 3 g/dL — ABNORMAL LOW (ref 3.5–5.0)
Alkaline Phosphatase: 83 U/L (ref 38–126)
Anion gap: 10 (ref 5–15)
BUN: 13 mg/dL (ref 6–20)
CO2: 23 mmol/L (ref 22–32)
Calcium: 8.8 mg/dL — ABNORMAL LOW (ref 8.9–10.3)
Chloride: 103 mmol/L (ref 98–111)
Creatinine: 0.83 mg/dL (ref 0.61–1.24)
GFR, Estimated: >60 mL/min (ref >60)
Glucose, Bld: 70 mg/dL (ref 70–99)
Potassium: 4 mmol/L (ref 3.5–5.1)
Sodium: 136 mmol/L (ref 135–145)
Total Bilirubin: 0.3 mg/dL (ref 0.3–1.2)
Total Protein: 6.6 g/dL (ref 6.5–8.1)

## 2021-06-12 MED ORDER — METHYLPREDNISOLONE SODIUM SUCC 125 MG IJ SOLR
80.0000 mg | Freq: Once | INTRAMUSCULAR | Status: AC
  Administered 2021-06-12: 80 mg via INTRAVENOUS
  Filled 2021-06-12: qty 2

## 2021-06-12 MED ORDER — SODIUM CHLORIDE 0.9 % IV SOLN
375.0000 mg/m2 | Freq: Once | INTRAVENOUS | Status: AC
  Administered 2021-06-12: 700 mg via INTRAVENOUS
  Filled 2021-06-12: qty 20

## 2021-06-12 MED ORDER — SODIUM CHLORIDE 0.9 % IV SOLN: Freq: Once | INTRAVENOUS | Status: AC

## 2021-06-12 MED ORDER — ACETAMINOPHEN 325 MG PO TABS
650.0000 mg | ORAL_TABLET | Freq: Once | ORAL | Status: AC
  Administered 2021-06-12: 650 mg via ORAL
  Filled 2021-06-12: qty 2

## 2021-06-12 MED ORDER — DIPHENHYDRAMINE HCL 25 MG PO CAPS
50.0000 mg | ORAL_CAPSULE | Freq: Once | ORAL | Status: AC
  Administered 2021-06-12: 50 mg via ORAL
  Filled 2021-06-12: qty 2

## 2021-06-12 MED ORDER — MEPERIDINE HCL 25 MG/ML IJ SOLN
25.0000 mg | Freq: Once | INTRAMUSCULAR | Status: AC
Start: 2021-06-12 — End: 2021-06-12
  Administered 2021-06-12: 25 mg via INTRAVENOUS
  Filled 2021-06-12: qty 1

## 2021-06-12 MED ORDER — FAMOTIDINE 20 MG IN NS 100 ML IVPB
20.0000 mg | Freq: Once | INTRAVENOUS | Status: AC
  Administered 2021-06-12: 20 mg via INTRAVENOUS
  Filled 2021-06-12: qty 100

## 2021-06-12 NOTE — Progress Notes (Signed)
Patient tolerated rituxan infusion well with no complaints or adverse reactions. Patient was able to reach maximum dose of $Remov'400mg'utjkLx$ /hr with no reaction.

## 2021-06-12 NOTE — Progress Notes (Signed)
Internal Medicine Clinic Attending  Case discussed with Dr. Steen  At the time of the visit.  We reviewed the resident's history and exam and pertinent patient test results.  I agree with the assessment, diagnosis, and plan of care documented in the resident's note.  

## 2021-06-12 NOTE — Progress Notes (Signed)
Ok to proceed with rituximab with CBC resulted.  T.O. Dr Genia Hotter, PharmD

## 2021-06-12 NOTE — Patient Instructions (Signed)
[facility]  Discharge Instructions: Thank you for choosing [facility] to provide your oncology and hematology care.   If you have a lab appointment with the [facility], please go directly to the [facility] and check in at the registration area.   Wear comfortable clothing and clothing appropriate for easy access to any Portacath or PICC line.   We strive to give you quality time with your provider. You may need to reschedule your appointment if you arrive late (15 or more minutes).  Arriving late affects you and other patients whose appointments are after yours.  Also, if you miss three or more appointments without notifying the office, you may be dismissed from the clinic at the provider's discretion.      For prescription refill requests, have your pharmacy contact our office and allow 72 hours for refills to be completed.    Today you received the following chemotherapy and/or immunotherapy agents rituxan      To help prevent nausea and vomiting after your treatment, we encourage you to take your nausea medication as directed.  BELOW ARE SYMPTOMS THAT SHOULD BE REPORTED IMMEDIATELY: *FEVER GREATER THAN 100.4 F (38 C) OR HIGHER *CHILLS OR SWEATING *NAUSEA AND VOMITING THAT IS NOT CONTROLLED WITH YOUR NAUSEA MEDICATION *UNUSUAL SHORTNESS OF BREATH *UNUSUAL BRUISING OR BLEEDING *URINARY PROBLEMS (pain or burning when urinating, or frequent urination) *BOWEL PROBLEMS (unusual diarrhea, constipation, pain near the anus) TENDERNESS IN MOUTH AND THROAT WITH OR WITHOUT PRESENCE OF ULCERS (sore throat, sores in mouth, or a toothache) UNUSUAL RASH, SWELLING OR PAIN  UNUSUAL VAGINAL DISCHARGE OR ITCHING   Items with * indicate a potential emergency and should be followed up as soon as possible or go to the Emergency Department if any problems should occur.  Please show the CHEMOTHERAPY ALERT CARD or IMMUNOTHERAPY ALERT CARD at check-in to the  Emergency Department and triage nurse.  Should you have questions after your visit or need to cancel or reschedule your appointment, please contact [facility]  Dept: [phone]  and follow the prompts.  Office hours are 8:00 a.m. to 4:30 p.m. Monday - Friday. Please note that voicemails left after 4:00 p.m. may not be returned until the following business day.  We are closed weekends and major holidays. You have access to a nurse at all times for urgent questions. Please call the main number to the clinic Dept: [phone] and follow the prompts.   For any non-urgent questions, you may also contact your provider using MyChart. We now offer e-Visits for anyone 70 and older to request care online for non-urgent symptoms. For details visit [email or web address].   Also download the MyChart app! Go to the app store, search "MyChart", open the app, select [facility], and log in with your MyChart username and password.  Due to Covid, a mask is required upon entering the hospital/clinic. If you do not have a mask, one will be given to you upon arrival. For doctor visits, patients may have 1 support person aged 5 or older with them. For treatment visits, patients cannot have anyone with them due to current Covid guidelines and our immunocompromised population.

## 2021-06-14 ENCOUNTER — Other Ambulatory Visit: Payer: Self-pay | Admitting: Internal Medicine

## 2021-06-14 DIAGNOSIS — N62 Hypertrophy of breast: Secondary | ICD-10-CM

## 2021-06-16 ENCOUNTER — Other Ambulatory Visit: Payer: Self-pay | Admitting: Internal Medicine

## 2021-06-16 ENCOUNTER — Other Ambulatory Visit (HOSPITAL_COMMUNITY): Payer: Self-pay

## 2021-06-16 DIAGNOSIS — N62 Hypertrophy of breast: Secondary | ICD-10-CM

## 2021-06-17 ENCOUNTER — Other Ambulatory Visit (HOSPITAL_COMMUNITY): Payer: Self-pay

## 2021-06-17 ENCOUNTER — Other Ambulatory Visit: Payer: Self-pay | Admitting: Internal Medicine

## 2021-06-17 DIAGNOSIS — N62 Hypertrophy of breast: Secondary | ICD-10-CM

## 2021-06-18 ENCOUNTER — Other Ambulatory Visit (HOSPITAL_COMMUNITY): Payer: Self-pay

## 2021-06-18 NOTE — Telephone Encounter (Signed)
Prescription intended as a short course. No refills indicated.

## 2021-06-19 ENCOUNTER — Inpatient Hospital Stay: Payer: Medicaid Other

## 2021-06-19 ENCOUNTER — Other Ambulatory Visit: Payer: Self-pay

## 2021-06-19 ENCOUNTER — Inpatient Hospital Stay (HOSPITAL_BASED_OUTPATIENT_CLINIC_OR_DEPARTMENT_OTHER): Payer: Medicaid Other | Admitting: Hematology and Oncology

## 2021-06-19 ENCOUNTER — Encounter: Payer: Self-pay | Admitting: Hematology and Oncology

## 2021-06-19 VITALS — BP 124/73 | HR 72 | Temp 97.2°F | Resp 18 | Ht 68.0 in | Wt 156.4 lb

## 2021-06-19 VITALS — BP 123/75 | HR 62 | Temp 97.8°F | Resp 16

## 2021-06-19 DIAGNOSIS — Z885 Allergy status to narcotic agent status: Secondary | ICD-10-CM | POA: Diagnosis not present

## 2021-06-19 DIAGNOSIS — R197 Diarrhea, unspecified: Secondary | ICD-10-CM | POA: Diagnosis not present

## 2021-06-19 DIAGNOSIS — R058 Other specified cough: Secondary | ICD-10-CM | POA: Diagnosis not present

## 2021-06-19 DIAGNOSIS — N62 Hypertrophy of breast: Secondary | ICD-10-CM

## 2021-06-19 DIAGNOSIS — C880 Waldenstrom macroglobulinemia: Secondary | ICD-10-CM

## 2021-06-19 DIAGNOSIS — I10 Essential (primary) hypertension: Secondary | ICD-10-CM | POA: Diagnosis not present

## 2021-06-19 DIAGNOSIS — R5383 Other fatigue: Secondary | ICD-10-CM | POA: Diagnosis not present

## 2021-06-19 DIAGNOSIS — R630 Anorexia: Secondary | ICD-10-CM | POA: Diagnosis not present

## 2021-06-19 DIAGNOSIS — D472 Monoclonal gammopathy: Secondary | ICD-10-CM

## 2021-06-19 DIAGNOSIS — Z596 Low income: Secondary | ICD-10-CM | POA: Diagnosis not present

## 2021-06-19 DIAGNOSIS — Z87891 Personal history of nicotine dependence: Secondary | ICD-10-CM | POA: Diagnosis not present

## 2021-06-19 DIAGNOSIS — R110 Nausea: Secondary | ICD-10-CM | POA: Diagnosis not present

## 2021-06-19 DIAGNOSIS — D649 Anemia, unspecified: Secondary | ICD-10-CM | POA: Diagnosis not present

## 2021-06-19 DIAGNOSIS — Z5941 Food insecurity: Secondary | ICD-10-CM | POA: Diagnosis not present

## 2021-06-19 DIAGNOSIS — Z5112 Encounter for antineoplastic immunotherapy: Secondary | ICD-10-CM | POA: Diagnosis not present

## 2021-06-19 DIAGNOSIS — I5022 Chronic systolic (congestive) heart failure: Secondary | ICD-10-CM | POA: Diagnosis not present

## 2021-06-19 DIAGNOSIS — R590 Localized enlarged lymph nodes: Secondary | ICD-10-CM | POA: Diagnosis not present

## 2021-06-19 DIAGNOSIS — Z79899 Other long term (current) drug therapy: Secondary | ICD-10-CM | POA: Diagnosis not present

## 2021-06-19 DIAGNOSIS — R519 Headache, unspecified: Secondary | ICD-10-CM | POA: Diagnosis not present

## 2021-06-19 DIAGNOSIS — Z8249 Family history of ischemic heart disease and other diseases of the circulatory system: Secondary | ICD-10-CM | POA: Diagnosis not present

## 2021-06-19 DIAGNOSIS — R252 Cramp and spasm: Secondary | ICD-10-CM | POA: Diagnosis not present

## 2021-06-19 DIAGNOSIS — R0602 Shortness of breath: Secondary | ICD-10-CM | POA: Diagnosis not present

## 2021-06-19 LAB — CBC WITH DIFFERENTIAL (CANCER CENTER ONLY)
Abs Immature Granulocytes: 0.02 10*3/uL (ref 0.00–0.07)
Basophils Absolute: 0 10*3/uL (ref 0.0–0.1)
Basophils Relative: 1 %
Eosinophils Absolute: 0.3 10*3/uL (ref 0.0–0.5)
Eosinophils Relative: 6 %
HCT: 31.9 % — ABNORMAL LOW (ref 39.0–52.0)
Hemoglobin: 9.9 g/dL — ABNORMAL LOW (ref 13.0–17.0)
Immature Granulocytes: 0 %
Lymphocytes Relative: 25 %
Lymphs Abs: 1.4 10*3/uL (ref 0.7–4.0)
MCH: 24.9 pg — ABNORMAL LOW (ref 26.0–34.0)
MCHC: 31 g/dL (ref 30.0–36.0)
MCV: 80.2 fL (ref 80.0–100.0)
Monocytes Absolute: 0.5 10*3/uL (ref 0.1–1.0)
Monocytes Relative: 8 %
Neutro Abs: 3.3 10*3/uL (ref 1.7–7.7)
Neutrophils Relative %: 60 %
Platelet Count: 288 10*3/uL (ref 150–400)
RBC: 3.98 MIL/uL — ABNORMAL LOW (ref 4.22–5.81)
RDW: 21.9 % — ABNORMAL HIGH (ref 11.5–15.5)
WBC Count: 5.6 10*3/uL (ref 4.0–10.5)
nRBC: 0 % (ref 0.0–0.2)

## 2021-06-19 LAB — CMP (CANCER CENTER ONLY)
ALT: 8 U/L (ref 0–44)
AST: 8 U/L — ABNORMAL LOW (ref 15–41)
Albumin: 3 g/dL — ABNORMAL LOW (ref 3.5–5.0)
Alkaline Phosphatase: 73 U/L (ref 38–126)
Anion gap: 11 (ref 5–15)
BUN: 8 mg/dL (ref 6–20)
CO2: 20 mmol/L — ABNORMAL LOW (ref 22–32)
Calcium: 8.5 mg/dL — ABNORMAL LOW (ref 8.9–10.3)
Chloride: 105 mmol/L (ref 98–111)
Creatinine: 0.85 mg/dL (ref 0.61–1.24)
GFR, Estimated: >60 mL/min (ref >60)
Glucose, Bld: 156 mg/dL — ABNORMAL HIGH (ref 70–99)
Potassium: 3.6 mmol/L (ref 3.5–5.1)
Sodium: 136 mmol/L (ref 135–145)
Total Bilirubin: 0.4 mg/dL (ref 0.3–1.2)
Total Protein: 5.7 g/dL — ABNORMAL LOW (ref 6.5–8.1)

## 2021-06-19 LAB — LACTATE DEHYDROGENASE: LDH: 93 U/L — ABNORMAL LOW (ref 98–192)

## 2021-06-19 MED ORDER — MEPERIDINE HCL 25 MG/ML IJ SOLN
25.0000 mg | Freq: Once | INTRAMUSCULAR | Status: AC
  Administered 2021-06-19: 25 mg via INTRAVENOUS
  Filled 2021-06-19: qty 1

## 2021-06-19 MED ORDER — DIPHENHYDRAMINE HCL 25 MG PO CAPS
50.0000 mg | ORAL_CAPSULE | Freq: Once | ORAL | Status: AC
  Administered 2021-06-19: 50 mg via ORAL
  Filled 2021-06-19: qty 2

## 2021-06-19 MED ORDER — METHYLPREDNISOLONE SODIUM SUCC 125 MG IJ SOLR
80.0000 mg | Freq: Once | INTRAMUSCULAR | Status: AC
  Administered 2021-06-19: 80 mg via INTRAVENOUS
  Filled 2021-06-19: qty 2

## 2021-06-19 MED ORDER — SODIUM CHLORIDE 0.9 % IV SOLN
375.0000 mg/m2 | Freq: Once | INTRAVENOUS | Status: AC
  Administered 2021-06-19: 700 mg via INTRAVENOUS
  Filled 2021-06-19: qty 50

## 2021-06-19 MED ORDER — SODIUM CHLORIDE 0.9 % IV SOLN: Freq: Once | INTRAVENOUS | Status: AC

## 2021-06-19 MED ORDER — OXYCODONE-ACETAMINOPHEN 5-325 MG PO TABS
1.0000 | ORAL_TABLET | Freq: Four times a day (QID) | ORAL | 0 refills | Status: DC | PRN
Start: 2021-06-19 — End: 2021-06-26

## 2021-06-19 MED ORDER — ACETAMINOPHEN 325 MG PO TABS
650.0000 mg | ORAL_TABLET | Freq: Once | ORAL | Status: AC
Start: 2021-06-19 — End: 2021-06-19
  Administered 2021-06-19: 650 mg via ORAL
  Filled 2021-06-19: qty 2

## 2021-06-19 MED ORDER — FAMOTIDINE 20 MG IN NS 100 ML IVPB
20.0000 mg | Freq: Once | INTRAVENOUS | Status: AC
  Administered 2021-06-19: 20 mg via INTRAVENOUS
  Filled 2021-06-19: qty 100

## 2021-06-19 NOTE — Patient Instructions (Signed)
[facility]  Discharge Instructions: Thank you for choosing [facility] to provide your oncology and hematology care.   If you have a lab appointment with the [facility], please go directly to the [facility] and check in at the registration area.   Wear comfortable clothing and clothing appropriate for easy access to any Portacath or PICC line.   We strive to give you quality time with your provider. You may need to reschedule your appointment if you arrive late (15 or more minutes).  Arriving late affects you and other patients whose appointments are after yours.  Also, if you miss three or more appointments without notifying the office, you may be dismissed from the clinic at the provider's discretion.      For prescription refill requests, have your pharmacy contact our office and allow 72 hours for refills to be completed.    Today you received the following chemotherapy and/or immunotherapy agents rituxan      To help prevent nausea and vomiting after your treatment, we encourage you to take your nausea medication as directed.  BELOW ARE SYMPTOMS THAT SHOULD BE REPORTED IMMEDIATELY: *FEVER GREATER THAN 100.4 F (38 C) OR HIGHER *CHILLS OR SWEATING *NAUSEA AND VOMITING THAT IS NOT CONTROLLED WITH YOUR NAUSEA MEDICATION *UNUSUAL SHORTNESS OF BREATH *UNUSUAL BRUISING OR BLEEDING *URINARY PROBLEMS (pain or burning when urinating, or frequent urination) *BOWEL PROBLEMS (unusual diarrhea, constipation, pain near the anus) TENDERNESS IN MOUTH AND THROAT WITH OR WITHOUT PRESENCE OF ULCERS (sore throat, sores in mouth, or a toothache) UNUSUAL RASH, SWELLING OR PAIN  UNUSUAL VAGINAL DISCHARGE OR ITCHING   Items with * indicate a potential emergency and should be followed up as soon as possible or go to the Emergency Department if any problems should occur.  Please show the CHEMOTHERAPY ALERT CARD or IMMUNOTHERAPY ALERT CARD at check-in to the  Emergency Department and triage nurse.  Should you have questions after your visit or need to cancel or reschedule your appointment, please contact [facility]  Dept: [phone]  and follow the prompts.  Office hours are 8:00 a.m. to 4:30 p.m. Monday - Friday. Please note that voicemails left after 4:00 p.m. may not be returned until the following business day.  We are closed weekends and major holidays. You have access to a nurse at all times for urgent questions. Please call the main number to the clinic Dept: [phone] and follow the prompts.   For any non-urgent questions, you may also contact your provider using MyChart. We now offer e-Visits for anyone 60 and older to request care online for non-urgent symptoms. For details visit [email or web address].   Also download the MyChart app! Go to the app store, search "MyChart", open the app, select [facility], and log in with your MyChart username and password.  Due to Covid, a mask is required upon entering the hospital/clinic. If you do not have a mask, one will be given to you upon arrival. For doctor visits, patients may have 1 support person aged 60 or older with them. For treatment visits, patients cannot have anyone with them due to current Covid guidelines and our immunocompromised population.

## 2021-06-21 NOTE — Progress Notes (Signed)
Advanced Heart Failure Team Clinic Note PCP: Juluis Mire HF Cardiologist: Dr. Haroldine Laws  HPI: Connor Solomon is a 60 y.o. WM w/ h/o HTN, new IDA and new systolic HF.   Pt had stress echo in 2010 for CP showing no stress arrhythmias or conduction abnormalities. The stress ECG was negative for ischemia. There was no echocardiographic evidence for stress-induced ischemia. EF normal. In 2011, he had syncope and evaluated by Dr. Harrington Challenger but this was felt to be vasovagal syncope. No further cardiac w/u.    Tested + for COVID 5/21. Did not require hospitalization but hasn't felt well since. Not vaccinated.   For the last 6 months he has noted marked fatigue + 20 lb unintentional wt loss and some occasional night sweats.   He presented to ED 04/07/21 where he was found to be in acute CHF. CXR suspicious for bronchopneumonia. Also could not r/o underlying malignancy. CT + for moderate bilateral pleural effusions and patchy airspace opacities consistent with multifocal pneumonia. He was found to be anemic w/ hgb 8.6.   Admitted by IM and started on IV Lasix. Echo showed severely reduced LVEF, 20-25% w/ glogal HK, Grade III DD (restrictive), RV normal. Mod-severe MR. No LVH. 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 ESR 142, autoimmune workup ensued. cMRI consistent with NICM, EF 25%.  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 Discharged on spiro, carvedilol and losartan (bp too soft for Entresto).  Seen by Dr. Lorenso Courier with Heme/Onc. 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 Started Ibrutinib + Rituximab 06/05/21.   Today he returns for HF follow up. Feeling better since starting CA treatments, appetite slowly improving. Still has fluttering sensation in chest and he is SOB/fatigued walking on flat ground short distances. Sleeping on 3-4 pillows.  Denies  CP, dizziness, edema.  Weight at home 158 pounds. Taking all medications. Struggling with anxiety.  Cardiac Studies:   - R/LHC (6/22): Normal cors, severe MICM EF 25%, low filling pressures with normal CO.   Ao =98/68 (82) LV = 81/3 RA = 1 RV = 27/2 PA = 25/8 (16) PCW = 10 Fick cardiac output/index = 5.8/3.2 PVR = 1.0 WU Ao sat = 100% PA sat = 66%, 70%   - Echo (6/22): EF 20-25%, Grade III DD, RV ok, mod/severe MR  ROS: All systems negative except as listed in HPI, PMH and Problem List.  SH:  Social History   Socioeconomic History   Marital status: Single    Spouse name: Not on file   Number of children: Not on file   Years of education: Not on file   Highest education level: Not on file  Occupational History   Not on file  Tobacco Use   Smoking status: Former    Types: Cigars    Quit date: 10/11/2020    Years since quitting: 0.6   Smokeless tobacco: Never   Tobacco comments:    smokes 1 cigar a week  Substance and Sexual Activity   Alcohol use: No   Drug use: No   Sexual activity: Not on file  Other Topics Concern   Not on file  Social History Narrative   Not on file   Social Determinants of Health   Financial Resource Strain: High Risk   Difficulty of Paying Living Expenses: Hard  Food Insecurity: Food Insecurity Present   Worried About Running Out of Food in the Last Year:  Sometimes true   Ran Out of Food in the Last Year: Sometimes true  Transportation Needs: No Transportation Needs   Lack of Transportation (Medical): No   Lack of Transportation (Non-Medical): No  Physical Activity: Not on file  Stress: Not on file  Social Connections: Not on file  Intimate Partner Violence: Not on file   FH:  Family History  Problem Relation Age of Onset   Heart attack Father    Sudden Cardiac Death Father 27   Diabetes Neg Hx    Hyperlipidemia Neg Hx    Hypertension Neg Hx    Past Medical History:  Diagnosis Date   Acute heart failure ([facility])  04/08/2021   Acute HFrEF (heart failure with reduced ejection fraction) ([facility]) 04/07/2021   Arthritis    hands, elbows bilaterally   Cancer ([facility])    Full dentures    Hypertension    Pt states he doen not have HTN and has never been treated for HTN   Kidney stone on left side last stone 4-5 yrs ago   recurrent (3 episodes)   Panic disorder    pt states has resolved   Syncope    resolved- was related to panic attacks   Current Outpatient Medications  Medication Sig Dispense Refill   acetaminophen (TYLENOL) 500 MG tablet Take 500 mg by mouth every 6 (six) hours as needed.     albuterol (PROAIR HFA) 108 (90 Base) MCG/ACT inhaler Inhale 2 puffs into the lungs every 6 (six) hours as needed for wheezing or shortness of breath. 8.5 g 2   allopurinol (ZYLOPRIM) 300 MG tablet Take 1 tablet (300 mg total) by mouth daily. 90 tablet 1   carvedilol (COREG) 3.125 MG tablet Take 1 tablet (3.125 mg total) by mouth 2 (two) times daily. 68 tablet 2   furosemide (LASIX) 20 MG tablet Take 1 tablet (20 mg total) by mouth daily as needed for swelling 100 tablet 2   ibrutinib 420 MG TABS Take 420 mg by mouth daily. 30 tablet 2   losartan (COZAAR) 25 MG tablet Take 0.5 tablets (12.5 mg total) by mouth at bedtime. 17 tablet 2   oxyCODONE-acetaminophen (PERCOCET) 5-325 MG tablet Take 1 tablet by mouth every 6 (six) hours as needed for severe pain. 30 tablet 0   ferrous sulfate 325 (65 FE) MG tablet Take 325 mg by mouth daily with breakfast. (Patient not taking: Reported on 06/22/2021)     No current facility-administered medications for this encounter.   BP (!) 148/78   Pulse 80   Wt 72.9 kg (160 lb 12.8 oz)   SpO2 98%   BMI 24.45 kg/m   Wt Readings from Last 3 Encounters:  06/22/21 72.9 kg (160 lb 12.8 oz)  06/19/21 70.9 kg (156 lb 6.4 oz)  06/12/21 69.3 kg (152 lb 12 oz)   PHYSICAL EXAM: General:  NAD. No resp difficulty HEENT: Normal Neck: Supple. JVP 7-8. Carotids 2+ bilat; no bruits. No  lymphadenopathy or thryomegaly appreciated. Cor: PMI nondisplaced. Regular rate & rhythm. No rubs, gallops or murmurs. Lungs: Clear Abdomen: Soft, nontender, nondistended. No hepatosplenomegaly. No bruits or masses. Good bowel sounds. Extremities: No cyanosis, clubbing, rash, edema Neuro: Alert & oriented x 3, cranial nerves grossly intact. Moves all 4 extremities w/o difficulty. Affect pleasant.  ECG: Sinus rhythm (personally reviewed).  ReDs: 40%  ASSESSMENT & PLAN:  1. Chronic Systolic Heart Failure - Stress Echo 2010: normal EF. No evidence of ischemia - Echo 6/22: EF 20-25%, GIIDD (restrictive), no  LVH. RV normal - ? Viral CM (COVID + 2021), ? Familia (FH of [facility]). TSH normal, HIV pending - LHC/RHC (6/22): with normal cors, low filling pressures, and normal cardiac output - Also ? Infiltrative CM. cMRI EF 25%, normal RV, Basal septal midwall LGE, mild MR.  - MM panel urine immunofixation: IgM monoclonal protein with kappa light chain  specificity. - NYHA IIIb. Volume status up, ReDs 40%. - Stop losartan. - Start Entresto 24/26 mg bid. (Given 4 week supply and paperwork signed for patient assistance). - Take lasix 20 mg daily x 3 days then prn weight gain/swelling. - Continue carvedilol 3.125 mg bid. - SGLT2i next (will need 90 day supply through HF fund). - Off spiro with gynecomastia (will hold off starting eplerenone as he has no insurance). - BMET today; repeat in 7-10 days. - Will place Zio next if palpitations still present after diuresis.  2.  Mitral Regurgitation - Mod-severe, likely functional from dilated LV.   3.  Iron Deficiency Anemia - Per PCP. - Recent Hgb 9.5.       4.  Waldenstrom's macroglobulinemia. - Now followed by Dr. Lorenso Courier with Heme/Onc - Now on Ibrutinib + Rituximab  5. SDOH - HFSW assisting with hospital bills and insurance.  6. Anxiety - Would likely benefit from [facility]. - Needs to follow up with PCP.   Follow up in 3 weeks with PharmD for  further medication titration. Anticipate adding SGLT2i, and 6 weeks with APP.   Follow up with Dr. Haroldine Laws in 3 months + echo.  Allena Katz, FNP-BC 06/22/21

## 2021-06-22 ENCOUNTER — Other Ambulatory Visit (HOSPITAL_COMMUNITY): Payer: Self-pay

## 2021-06-22 ENCOUNTER — Ambulatory Visit (HOSPITAL_COMMUNITY)
Admission: RE | Admit: 2021-06-22 | Discharge: 2021-06-22 | Disposition: A | Discharge status: Home / Self Care | Payer: Medicaid Other | Source: Ambulatory Visit | Attending: Family Medicine | Admitting: Family Medicine

## 2021-06-22 ENCOUNTER — Encounter (HOSPITAL_COMMUNITY): Payer: Self-pay

## 2021-06-22 ENCOUNTER — Telehealth (HOSPITAL_COMMUNITY): Payer: Self-pay | Admitting: Pharmacy Technician

## 2021-06-22 ENCOUNTER — Other Ambulatory Visit: Payer: Self-pay

## 2021-06-22 VITALS — BP 148/78 | HR 80 | Wt 160.8 lb

## 2021-06-22 DIAGNOSIS — F419 Anxiety disorder, unspecified: Secondary | ICD-10-CM | POA: Insufficient documentation

## 2021-06-22 DIAGNOSIS — I110 Hypertensive heart disease with heart failure: Secondary | ICD-10-CM | POA: Insufficient documentation

## 2021-06-22 DIAGNOSIS — R9431 Abnormal electrocardiogram [ECG] [EKG]: Secondary | ICD-10-CM | POA: Diagnosis not present

## 2021-06-22 DIAGNOSIS — C880 Waldenstrom macroglobulinemia: Secondary | ICD-10-CM | POA: Insufficient documentation

## 2021-06-22 DIAGNOSIS — I5022 Chronic systolic (congestive) heart failure: Secondary | ICD-10-CM | POA: Diagnosis not present

## 2021-06-22 DIAGNOSIS — Z87891 Personal history of nicotine dependence: Secondary | ICD-10-CM | POA: Diagnosis not present

## 2021-06-22 DIAGNOSIS — Z28310 Unvaccinated for covid-19: Secondary | ICD-10-CM | POA: Insufficient documentation

## 2021-06-22 DIAGNOSIS — Z79899 Other long term (current) drug therapy: Secondary | ICD-10-CM | POA: Insufficient documentation

## 2021-06-22 DIAGNOSIS — D509 Iron deficiency anemia, unspecified: Secondary | ICD-10-CM | POA: Insufficient documentation

## 2021-06-22 DIAGNOSIS — I340 Nonrheumatic mitral (valve) insufficiency: Secondary | ICD-10-CM | POA: Insufficient documentation

## 2021-06-22 DIAGNOSIS — I5020 Unspecified systolic (congestive) heart failure: Secondary | ICD-10-CM

## 2021-06-22 LAB — BASIC METABOLIC PANEL
Anion gap: 10 (ref 5–15)
BUN: 13 mg/dL (ref 6–20)
CO2: 24 mmol/L (ref 22–32)
Calcium: 7.8 mg/dL — ABNORMAL LOW (ref 8.9–10.3)
Chloride: 100 mmol/L (ref 98–111)
Creatinine, Ser: 0.83 mg/dL (ref 0.61–1.24)
GFR, Estimated: >60 mL/min (ref >60)
Glucose, Bld: 70 mg/dL (ref 70–99)
Potassium: 3.6 mmol/L (ref 3.5–5.1)
Sodium: 134 mmol/L — ABNORMAL LOW (ref 135–145)

## 2021-06-22 MED ORDER — FERROUS SULFATE 325 (65 FE) MG PO TABS: 325.0000 mg | ORAL_TABLET | Freq: Every day | ORAL | 7 refills | Status: DC

## 2021-06-22 MED ORDER — ENTRESTO 24-26 MG PO TABS: 1.0000 | ORAL_TABLET | Freq: Two times a day (BID) | ORAL | 5 refills | Status: DC

## 2021-06-22 NOTE — Patient Instructions (Addendum)
Labs were done today, if any labs come back abnormal the clinic will call you  START Entresto 24/26 mg 1 tablet twice daily  TAKE Lasix 20 mg daily for 3 days then as needed  STOP Losartan  Make appointment with PCP for anxiety  Your physician recommends that you schedule a follow-up appointment in:   6 week and then 3 months with echocardiogram   3 weeks with Pharmacy clinic  At the [facility], you and your health needs are our priority. As part of our continuing mission to provide you with exceptional heart care, we have created designated Provider Care Teams. These Care Teams include your primary Cardiologist (physician) and Advanced Practice Providers (APPs- Physician Assistants and Nurse Practitioners) who all work together to provide you with the care you need, when you need it.   You may see any of the following providers on your designated Care Team at your next follow up: Dr Glori Bickers Dr Loralie Champagne Dr Patrice Paradise, NP Lyda Jester, [place] Ginnie Smart Audry Riles, PharmD   Please be sure to bring in all your medications bottles to every appointment.    If you have any questions or concerns before your next appointment please send [place] a message through [facility] or call our office at [phone].    TO LEAVE A MESSAGE FOR THE NURSE SELECT OPTION 2, PLEASE LEAVE A MESSAGE INCLUDING: YOUR NAME DATE OF BIRTH CALL BACK NUMBER REASON FOR CALL**this is important as we prioritize the call backs  YOU WILL RECEIVE A CALL BACK THE SAME DAY AS LONG AS YOU CALL BEFORE 4:00 PM

## 2021-06-22 NOTE — Telephone Encounter (Signed)
Advanced Heart Failure Patient Advocate Encounter  Patient was seen in clinic today and started on Entresto.   The patient is currently uninsured. Started an [place] for [employer] assistance.   Will fax in once signatures are obtained.

## 2021-06-23 ENCOUNTER — Encounter: Payer: Self-pay | Admitting: Hematology and Oncology

## 2021-06-23 NOTE — Progress Notes (Signed)
[facility] [phone]   [phone]  PROGRESS NOTE  Patient Care Team: Delene Ruffini, MD as PCP - General (Internal Medicine)  Hematological/Oncological History # Waldenstrom's Macroglobulinemia # IgM Monoclonal Gammopathy 04/07/2021: CT A/p showed mildly enlarged retroperitoneal lymph nodes and bilateral inguinal lymph nodes.  04/08/2021: IgM Kappa M protein 1.2 04/09/2021: WBC 11.1, Hgb 9.5, MCV 75.4, Plt 525 04/20/2021: Kappa 977, Lambda 7.8, K/L ratio 125.28. Serum viscosity 1.8 05/06/2021: Establish care with Dr. 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 Infusion reaction with ritux, held halfway through.  06/12/2021: Cycle 2 Day 1 of Ibrutinib + Rituximab 06/19/2021: Cycle 3 Day 1 of Ibrutinib + Rituximab   Interval History:  Connor Solomon 60 y.o. male with medical history significant for Waldenstrom's macroglobulinemia who presents for a follow up visit. The patient's last visit was on 06/05/2021. In the interim since the last visit he has completed 2 cycles of Ibrutinib + Rituximab.  On exam today Connor Solomon notes he is feeling better than it was prior to starting treatment.  He notes that he is still having some fatigue and shortness of breath but he is glad that his weight is increasing.  He feels wiped out in particular after rituximab.  He notes that he was having some diarrhea at first he is taking Imodium and it has slowed this down considerably.  He is having some bruising on his arms but denies any overt signs of bleeding he also notes that his motivation has improved he has been going outside and trying to walk more.  He also thinks that the muscle cramps that he was having in his arms and legs are improved from prior.  He otherwise denies any fevers,  chills, sweats, nausea, vomiting or constipation.  Full 10 point ROS is listed below.  MEDICAL HISTORY:  Past Medical History:  Diagnosis Date   Acute heart failure ([place]) 04/08/2021   Acute HFrEF (heart failure with reduced ejection fraction) ([place]) 04/07/2021   Arthritis    hands, elbows bilaterally   Cancer ([facility])    Full dentures    Hypertension    Pt states he doen not have HTN and has never been treated for HTN   Kidney stone on left side last stone 4-5 yrs ago   recurrent (3 episodes)   Panic disorder    pt states has resolved   Syncope    resolved- was related to panic attacks    SURGICAL HISTORY: Past Surgical History:  Procedure Laterality Date   CARPAL METACARPAL FUSION WITH DISTAL RADIAL BONE GRAFT Left 05/30/2013   Procedure: LEFT THUMB METACARPAL JOINT FUSION;  Surgeon: Schuyler Amor, MD;  Location: [place];  Service: Orthopedics;  Laterality: Left;   ESOPHAGOGASTRODUODENOSCOPY N/A 02/12/2014   Procedure: ESOPHAGOGASTRODUODENOSCOPY (EGD);  Surgeon: Jerene Bears, MD;  Location: [facility] ENDOSCOPY;  Service: Endoscopy;  Laterality: N/A;   FOREIGN BODY REMOVAL N/A 02/12/2014   Procedure: FOREIGN BODY REMOVAL;  Surgeon: Jerene Bears, MD;  Location: [place];  Service: Endoscopy;  Laterality: N/A;   OPEN REDUCTION INTERNAL FIXATION (ORIF) DISTAL RADIAL FRACTURE Left 11/29/2012   Procedure: OPEN REDUCTION INTERNAL FIXATION (ORIF) DISTAL RADIAL FRACTURE;  Surgeon: Schuyler Amor, MD;  Location: [place];  Service: Orthopedics;  Laterality: Left;  LEFT DISTAL RADIUS OSTEOTOMY WITH BONE GRAFT   RIGHT/LEFT HEART  CATH AND CORONARY ANGIOGRAPHY N/A 04/09/2021   Procedure: RIGHT/LEFT HEART CATH AND CORONARY ANGIOGRAPHY;  Surgeon: Jolaine Artist, MD;  Location: [place] [facility] [facility];  Service: Cardiovascular;  Laterality: N/A;   TENDON REPAIR Left 02/07/2013   Procedure: LEFT EXTENSOR INDICIS PROPRIUS  TO EXTENSOR POLLICIS LONGUS TENDON TRANSFER;   Surgeon: Schuyler Amor, MD;  Location: [place];  Service: Orthopedics;  Laterality: Left;   WISDOM TOOTH EXTRACTION     WRIST SURGERY     fx only    SOCIAL HISTORY: Social History   Socioeconomic History   Marital status: Single    Spouse name: Not on file   Number of children: Not on file   Years of education: Not on file   Highest education level: Not on file  Occupational History   Not on file  Tobacco Use   Smoking status: Former    Types: Cigars    Quit date: 10/11/2020    Years since quitting: 0.6   Smokeless tobacco: Never   Tobacco comments:    smokes 1 cigar a week  Substance and Sexual Activity   Alcohol use: No   Drug use: No   Sexual activity: Not on file  Other Topics Concern   Not on file  Social History Narrative   Not on file   Social Determinants of Health   Financial Resource Strain: High Risk   Difficulty of Paying Living Expenses: Hard  Food Insecurity: Food Insecurity Present   Worried About Running Out of Food in the Last Year: Sometimes true   Ran Out of Food in the Last Year: Sometimes true  Transportation Needs: No Transportation Needs   Lack of Transportation (Medical): No   Lack of Transportation (Non-Medical): No  Physical Activity: Not on file  Stress: Not on file  Social Connections: Not on file  Intimate Partner Violence: Not on file    FAMILY HISTORY: Family History  Problem Relation Age of Onset   Heart attack Father    Sudden Cardiac Death Father 49   Diabetes Neg Hx    Hyperlipidemia Neg Hx    Hypertension Neg Hx     ALLERGIES:  is allergic to rituxan [rituximab] and hydrocodone.  MEDICATIONS:  Current Outpatient Medications  Medication Sig Dispense Refill   acetaminophen (TYLENOL) 500 MG tablet Take 500 mg by mouth every 6 (six) hours as needed.     albuterol (PROAIR HFA) 108 (90 Base) MCG/ACT inhaler Inhale 2 puffs into the lungs every 6 (six) hours as needed for wheezing or shortness of  breath. 8.5 g 2   allopurinol (ZYLOPRIM) 300 MG tablet Take 1 tablet (300 mg total) by mouth daily. 90 tablet 1   carvedilol (COREG) 3.125 MG tablet Take 1 tablet (3.125 mg total) by mouth 2 (two) times daily. 68 tablet 2   ferrous sulfate 325 (65 FE) MG tablet Take 1 tablet (325 mg total) by mouth daily with breakfast. 30 tablet 7   furosemide (LASIX) 20 MG tablet Take 1 tablet (20 mg total) by mouth daily as needed for swelling 100 tablet 2   ibrutinib 420 MG TABS Take 420 mg by mouth daily. 30 tablet 2   oxyCODONE-acetaminophen (PERCOCET) 5-325 MG tablet Take 1 tablet by mouth every 6 (six) hours as needed for severe pain. 30 tablet 0   sacubitril-valsartan (ENTRESTO) 24-26 MG Take 1 tablet by mouth 2 (two) times daily. 60 tablet 5   No current facility-administered medications for this visit.  REVIEW OF SYSTEMS:   Constitutional: ( - ) fevers, ( - )  chills , ( - ) night sweats Eyes: ( - ) blurriness of vision, ( - ) double vision, ( - ) watery eyes Ears, nose, mouth, throat, and face: ( - ) mucositis, ( - ) sore throat Respiratory: ( - ) cough, ( - ) dyspnea, ( - ) wheezes Cardiovascular: ( - ) palpitation, ( - ) chest discomfort, ( - ) lower extremity swelling Gastrointestinal:  ( - ) nausea, ( - ) heartburn, ( - ) change in bowel habits Skin: ( - ) abnormal skin rashes Lymphatics: ( - ) new lymphadenopathy, ( - ) easy bruising Neurological: ( - ) numbness, ( - ) tingling, ( - ) new weaknesses Behavioral/Psych: ( - ) mood change, ( - ) new changes  All other systems were reviewed with the patient and are negative.  PHYSICAL EXAMINATION: ECOG PERFORMANCE STATUS: 1 - Symptomatic but completely ambulatory  Vitals:   06/19/21 1017  BP: 124/73  Pulse: 72  Resp: 18  Temp: (!) 97.2 F (36.2 C)  SpO2: 100%   Filed Weights   06/19/21 1017  Weight: 156 lb 6.4 oz (70.9 kg)    GENERAL: Well-appearing middle-aged Caucasian male, alert, no distress and comfortable SKIN: skin  color, texture, turgor are normal, no rashes or significant lesions EYES: conjunctiva are pink and non-injected, sclera clear LUNGS: clear to auscultation and percussion with normal breathing effort HEART: regular rate & rhythm and no murmurs and no lower extremity edema  PSYCH: alert & oriented x 3, fluent speech NEURO: no focal motor/sensory deficits  LABORATORY DATA:  I have reviewed the data as listed CBC Latest Ref Rng & Units 06/19/2021 06/12/2021 06/05/2021  WBC 4.0 - 10.5 K/uL 5.6 8.0 7.8  Hemoglobin 13.0 - 17.0 g/dL 9.9(L) 8.2(L) 8.2(L)  Hematocrit 39.0 - 52.0 % 31.9(L) 25.8(L) 25.8(L)  Platelets 150 - 400 K/uL 288 399 468(H)    CMP Latest Ref Rng & Units 06/22/2021 06/19/2021 06/12/2021  Glucose 70 - 99 mg/dL 70 156(H) 70  BUN 6 - 20 mg/dL $Remove'13 8 13  'ReRaVZD$ Creatinine 0.61 - 1.24 mg/dL 0.83 0.85 0.83  Sodium 135 - 145 mmol/L 134(L) 136 136  Potassium 3.5 - 5.1 mmol/L 3.6 3.6 4.0  Chloride 98 - 111 mmol/L 100 105 103  CO2 22 - 32 mmol/L 24 20(L) 23  Calcium 8.9 - 10.3 mg/dL 7.8(L) 8.5(L) 8.8(L)  Total Protein 6.5 - 8.1 g/dL - 5.7(L) 6.6  Total Bilirubin 0.3 - 1.2 mg/dL - 0.4 0.3  Alkaline Phos 38 - 126 U/L - 73 83  AST 15 - 41 U/L - 8(L) 14(L)  ALT 0 - 44 U/L - 8 11    Lab Results  Component Value Date   MPROTEIN 1.2 (H) 04/08/2021   Lab Results  Component Value Date   KAPLAMBRATIO 875.18 (H) 05/12/2021    RADIOGRAPHIC STUDIES: No results found.  ASSESSMENT & PLAN Connor Solomon 60 y.o. male with medical history significant for Waldenstrom's macroglobulinemia who presents for a follow up visit.   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  There is no clear evidence of hyperviscosity syndrome at this time.  At this time we will plan to proceed with ibrutinib and rituximab therapy.  One could also consider Bendamustine and rituximab therapy  but given the patient's degree of anemia I would prefer  pursuing ibrutinib and rituximab at this time.  Additionally ibrutinib/rituximab are category 1 recommendations per the NCCN guidelines.  The treatment will consist of ibrutinib 420 mg p.o. daily with rituximab on weeks 1 through 4 as well as weeks 27 through 30.  Previously we discussed the risks and benefits of therapy including (but not limited to) risk of bleeding, fatigue, atypical infections, and cardiac arrhythmias.  The patient voices understanding of this plan moving forward.  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  This is confirmed with an IgM monoclonal colopathy and bone marrow biopsy results showing a lymphoplasmacytic lymphoma --Today (06/19/2021) is Cycle 3 Day 1 of ibrutinib plus rituximab therapy. --No clear signs of hyperviscosity syndrome at this time. 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  -- At this time the patient's hemoglobin is most consistent with anemia due to lymphomatous involvement of bone marrow --There does appear to be a component of iron deficiency anemia as well.  Recommend the patient continue p.o. iron therapy --Continue to monitor.  #Supportive Care -- chemotherapy education complete -- port placement not required -- allopurinol 300mg  PO daily for TLS prophylaxis   No orders of the defined types were placed in this encounter.  All questions were answered. The patient knows to call the clinic with any problems, questions or concerns.  A total of more than 30 minutes were spent on this encounter with face-to-face time and non-face-to-face time, including preparing to see the patient, ordering tests and/or medications, counseling the patient and coordination of care as outlined above.   Ledell Peoples, MD Department of Hematology/Oncology [place] [facility] Phone: [phone] Pager: [phone] Email: [email or web address]  06/23/2021 10:13 AM  Dimopoulos MA, Rennie Natter, [place], Mahe B, Herbaux C, Tam C, Orsucci L, [place] ML, Matous JV, Thaxton C, Kastritis E, [place], Li J, Salman Z, Graef T, Buske C; iNNOVATE Study Group and the [employer] for [employer]. Phase 3 Trial of Ibrutinib plus Rituximab in Waldenstrm's Macroglobulinemia. Alison Stalling J Med. 2018 Jun 21;378(25):2399-2410.   --At 30 months, the progression-free survival rate was 82% with ibrutinib-rituximab versus 28% with placebo-rituximab (hazard ratio for progression or death, 0.20; P<0.001).

## 2021-06-26 ENCOUNTER — Other Ambulatory Visit: Payer: Self-pay | Admitting: Hematology and Oncology

## 2021-06-26 ENCOUNTER — Other Ambulatory Visit: Payer: Self-pay

## 2021-06-26 ENCOUNTER — Telehealth: Payer: Self-pay | Admitting: Internal Medicine

## 2021-06-26 ENCOUNTER — Inpatient Hospital Stay: Payer: Medicaid Other

## 2021-06-26 ENCOUNTER — Telehealth: Payer: Self-pay | Admitting: *Deleted

## 2021-06-26 VITALS — BP 115/77 | HR 66 | Temp 98.0°F | Resp 16

## 2021-06-26 DIAGNOSIS — Z8249 Family history of ischemic heart disease and other diseases of the circulatory system: Secondary | ICD-10-CM | POA: Diagnosis not present

## 2021-06-26 DIAGNOSIS — Z885 Allergy status to narcotic agent status: Secondary | ICD-10-CM | POA: Diagnosis not present

## 2021-06-26 DIAGNOSIS — Z79899 Other long term (current) drug therapy: Secondary | ICD-10-CM | POA: Diagnosis not present

## 2021-06-26 DIAGNOSIS — R630 Anorexia: Secondary | ICD-10-CM | POA: Diagnosis not present

## 2021-06-26 DIAGNOSIS — Z5112 Encounter for antineoplastic immunotherapy: Secondary | ICD-10-CM | POA: Diagnosis not present

## 2021-06-26 DIAGNOSIS — Z5941 Food insecurity: Secondary | ICD-10-CM | POA: Diagnosis not present

## 2021-06-26 DIAGNOSIS — I5022 Chronic systolic (congestive) heart failure: Secondary | ICD-10-CM | POA: Diagnosis not present

## 2021-06-26 DIAGNOSIS — C880 Waldenstrom macroglobulinemia: Secondary | ICD-10-CM

## 2021-06-26 DIAGNOSIS — Z596 Low income: Secondary | ICD-10-CM | POA: Diagnosis not present

## 2021-06-26 DIAGNOSIS — Z87891 Personal history of nicotine dependence: Secondary | ICD-10-CM | POA: Diagnosis not present

## 2021-06-26 DIAGNOSIS — D649 Anemia, unspecified: Secondary | ICD-10-CM | POA: Diagnosis not present

## 2021-06-26 DIAGNOSIS — R519 Headache, unspecified: Secondary | ICD-10-CM | POA: Diagnosis not present

## 2021-06-26 DIAGNOSIS — N62 Hypertrophy of breast: Secondary | ICD-10-CM

## 2021-06-26 DIAGNOSIS — R197 Diarrhea, unspecified: Secondary | ICD-10-CM | POA: Diagnosis not present

## 2021-06-26 DIAGNOSIS — R058 Other specified cough: Secondary | ICD-10-CM | POA: Diagnosis not present

## 2021-06-26 DIAGNOSIS — R110 Nausea: Secondary | ICD-10-CM | POA: Diagnosis not present

## 2021-06-26 DIAGNOSIS — I10 Essential (primary) hypertension: Secondary | ICD-10-CM | POA: Diagnosis not present

## 2021-06-26 DIAGNOSIS — D472 Monoclonal gammopathy: Secondary | ICD-10-CM | POA: Diagnosis not present

## 2021-06-26 DIAGNOSIS — R252 Cramp and spasm: Secondary | ICD-10-CM | POA: Diagnosis not present

## 2021-06-26 DIAGNOSIS — R5383 Other fatigue: Secondary | ICD-10-CM | POA: Diagnosis not present

## 2021-06-26 DIAGNOSIS — R590 Localized enlarged lymph nodes: Secondary | ICD-10-CM | POA: Diagnosis not present

## 2021-06-26 DIAGNOSIS — R0602 Shortness of breath: Secondary | ICD-10-CM | POA: Diagnosis not present

## 2021-06-26 LAB — CMP (CANCER CENTER ONLY)
ALT: 6 U/L (ref 0–44)
AST: 7 U/L — ABNORMAL LOW (ref 15–41)
Albumin: 3.3 g/dL — ABNORMAL LOW (ref 3.5–5.0)
Alkaline Phosphatase: 80 U/L (ref 38–126)
Anion gap: 9 (ref 5–15)
BUN: 11 mg/dL (ref 6–20)
CO2: 21 mmol/L — ABNORMAL LOW (ref 22–32)
Calcium: 8.7 mg/dL — ABNORMAL LOW (ref 8.9–10.3)
Chloride: 106 mmol/L (ref 98–111)
Creatinine: 0.82 mg/dL (ref 0.61–1.24)
GFR, Estimated: >60 mL/min (ref >60)
Glucose, Bld: 87 mg/dL (ref 70–99)
Potassium: 3.8 mmol/L (ref 3.5–5.1)
Sodium: 136 mmol/L (ref 135–145)
Total Bilirubin: 0.4 mg/dL (ref 0.3–1.2)
Total Protein: 5.9 g/dL — ABNORMAL LOW (ref 6.5–8.1)

## 2021-06-26 LAB — CBC WITH DIFFERENTIAL (CANCER CENTER ONLY)
Abs Immature Granulocytes: 0.01 10*3/uL (ref 0.00–0.07)
Basophils Absolute: 0 10*3/uL (ref 0.0–0.1)
Basophils Relative: 1 %
Eosinophils Absolute: 0.4 10*3/uL (ref 0.0–0.5)
Eosinophils Relative: 6 %
HCT: 34.7 % — ABNORMAL LOW (ref 39.0–52.0)
Hemoglobin: 11.2 g/dL — ABNORMAL LOW (ref 13.0–17.0)
Immature Granulocytes: 0 %
Lymphocytes Relative: 24 %
Lymphs Abs: 1.4 10*3/uL (ref 0.7–4.0)
MCH: 25.7 pg — ABNORMAL LOW (ref 26.0–34.0)
MCHC: 32.3 g/dL (ref 30.0–36.0)
MCV: 79.6 fL — ABNORMAL LOW (ref 80.0–100.0)
Monocytes Absolute: 0.7 10*3/uL (ref 0.1–1.0)
Monocytes Relative: 11 %
Neutro Abs: 3.5 10*3/uL (ref 1.7–7.7)
Neutrophils Relative %: 58 %
Platelet Count: 248 10*3/uL (ref 150–400)
RBC: 4.36 MIL/uL (ref 4.22–5.81)
RDW: 21.2 % — ABNORMAL HIGH (ref 11.5–15.5)
WBC Count: 6 10*3/uL (ref 4.0–10.5)
nRBC: 0 % (ref 0.0–0.2)

## 2021-06-26 LAB — LACTATE DEHYDROGENASE: LDH: 87 U/L — ABNORMAL LOW (ref 98–192)

## 2021-06-26 MED ORDER — OXYCODONE-ACETAMINOPHEN 5-325 MG PO TABS: 1.0000 | ORAL_TABLET | Freq: Four times a day (QID) | ORAL | 0 refills | Status: DC | PRN

## 2021-06-26 MED ORDER — SODIUM CHLORIDE 0.9 % IV SOLN: Freq: Once | INTRAVENOUS | Status: AC

## 2021-06-26 MED ORDER — SODIUM CHLORIDE 0.9 % IV SOLN
375.0000 mg/m2 | Freq: Once | INTRAVENOUS | Status: AC
  Administered 2021-06-26: 700 mg via INTRAVENOUS
  Filled 2021-06-26: qty 50

## 2021-06-26 MED ORDER — MEPERIDINE HCL 25 MG/ML IJ SOLN
25.0000 mg | Freq: Once | INTRAMUSCULAR | Status: AC
Start: 2021-06-26 — End: 2021-06-26
  Administered 2021-06-26: 25 mg via INTRAVENOUS
  Filled 2021-06-26: qty 1

## 2021-06-26 MED ORDER — DIPHENHYDRAMINE HCL 25 MG PO CAPS
50.0000 mg | ORAL_CAPSULE | Freq: Once | ORAL | Status: AC
  Administered 2021-06-26: 50 mg via ORAL
  Filled 2021-06-26: qty 2

## 2021-06-26 MED ORDER — ACETAMINOPHEN 325 MG PO TABS
650.0000 mg | ORAL_TABLET | Freq: Once | ORAL | Status: AC
  Administered 2021-06-26: 650 mg via ORAL
  Filled 2021-06-26: qty 2

## 2021-06-26 MED ORDER — METHYLPREDNISOLONE SODIUM SUCC 125 MG IJ SOLR
80.0000 mg | Freq: Once | INTRAMUSCULAR | Status: AC
  Administered 2021-06-26: 80 mg via INTRAVENOUS
  Filled 2021-06-26: qty 2

## 2021-06-26 MED ORDER — FAMOTIDINE 20 MG IN NS 100 ML IVPB
20.0000 mg | Freq: Once | INTRAVENOUS | Status: AC
  Administered 2021-06-26: 20 mg via INTRAVENOUS
  Filled 2021-06-26: qty 100

## 2021-06-26 NOTE — Patient Instructions (Signed)
[facility]  Discharge Instructions: Thank you for choosing [facility] to provide your oncology and hematology care.   If you have a lab appointment with the [facility], please go directly to the [facility] and check in at the registration area.   Wear comfortable clothing and clothing appropriate for easy access to any Portacath or PICC line.   We strive to give you quality time with your provider. You may need to reschedule your appointment if you arrive late (15 or more minutes).  Arriving late affects you and other patients whose appointments are after yours.  Also, if you miss three or more appointments without notifying the office, you may be dismissed from the clinic at the provider's discretion.      For prescription refill requests, have your pharmacy contact our office and allow 72 hours for refills to be completed.    Today you received the following chemotherapy and/or immunotherapy agents rituxan      To help prevent nausea and vomiting after your treatment, we encourage you to take your nausea medication as directed.  BELOW ARE SYMPTOMS THAT SHOULD BE REPORTED IMMEDIATELY: *FEVER GREATER THAN 100.4 F (38 C) OR HIGHER *CHILLS OR SWEATING *NAUSEA AND VOMITING THAT IS NOT CONTROLLED WITH YOUR NAUSEA MEDICATION *UNUSUAL SHORTNESS OF BREATH *UNUSUAL BRUISING OR BLEEDING *URINARY PROBLEMS (pain or burning when urinating, or frequent urination) *BOWEL PROBLEMS (unusual diarrhea, constipation, pain near the anus) TENDERNESS IN MOUTH AND THROAT WITH OR WITHOUT PRESENCE OF ULCERS (sore throat, sores in mouth, or a toothache) UNUSUAL RASH, SWELLING OR PAIN  UNUSUAL VAGINAL DISCHARGE OR ITCHING   Items with * indicate a potential emergency and should be followed up as soon as possible or go to the Emergency Department if any problems should occur.  Please show the CHEMOTHERAPY ALERT CARD or IMMUNOTHERAPY ALERT CARD at check-in to the  Emergency Department and triage nurse.  Should you have questions after your visit or need to cancel or reschedule your appointment, please contact [facility]  Dept: [phone]  and follow the prompts.  Office hours are 8:00 a.m. to 4:30 p.m. Monday - Friday. Please note that voicemails left after 4:00 p.m. may not be returned until the following business day.  We are closed weekends and major holidays. You have access to a nurse at all times for urgent questions. Please call the main number to the clinic Dept: [phone] and follow the prompts.   For any non-urgent questions, you may also contact your provider using MyChart. We now offer e-Visits for anyone 3 and older to request care online for non-urgent symptoms. For details visit [email or web address].   Also download the MyChart app! Go to the app store, search "MyChart", open the app, select [facility], and log in with your MyChart username and password.  Due to Covid, a mask is required upon entering the hospital/clinic. If you do not have a mask, one will be given to you upon arrival. For doctor visits, patients may have 1 support person aged 82 or older with them. For treatment visits, patients cannot have anyone with them due to current Covid guidelines and our immunocompromised population.

## 2021-06-26 NOTE — Telephone Encounter (Signed)
Received vm message from pt requesting refill of his percocet @ [facility] on [address].

## 2021-06-26 NOTE — Telephone Encounter (Signed)
Returned call to patient. No answer. Left detailed message on patient's self-identified VM that he would need to call back to schedule appt to discuss starting med for anxiety.

## 2021-06-26 NOTE — Progress Notes (Signed)
Ok to treat with just CBC per Dr.Dorsey

## 2021-06-26 NOTE — Telephone Encounter (Signed)
Pt requesting a call back about starting a new medication for his anxiety.

## 2021-06-29 ENCOUNTER — Telehealth: Payer: Self-pay

## 2021-06-29 ENCOUNTER — Encounter: Payer: Self-pay | Admitting: Internal Medicine

## 2021-06-29 ENCOUNTER — Other Ambulatory Visit: Payer: Self-pay

## 2021-06-29 ENCOUNTER — Ambulatory Visit (INDEPENDENT_AMBULATORY_CARE_PROVIDER_SITE_OTHER): Payer: Self-pay | Admitting: Internal Medicine

## 2021-06-29 ENCOUNTER — Ambulatory Visit (HOSPITAL_COMMUNITY)
Admission: RE | Admit: 2021-06-29 | Discharge: 2021-06-29 | Disposition: A | Discharge status: Home / Self Care | Payer: Medicaid Other | Source: Ambulatory Visit | Attending: Internal Medicine | Admitting: Internal Medicine

## 2021-06-29 DIAGNOSIS — I5022 Chronic systolic (congestive) heart failure: Secondary | ICD-10-CM | POA: Diagnosis not present

## 2021-06-29 DIAGNOSIS — F068 Other specified mental disorders due to known physiological condition: Secondary | ICD-10-CM | POA: Insufficient documentation

## 2021-06-29 LAB — BASIC METABOLIC PANEL
Anion gap: 9 (ref 5–15)
BUN: 7 mg/dL (ref 6–20)
CO2: 22 mmol/L (ref 22–32)
Calcium: 8.5 mg/dL — ABNORMAL LOW (ref 8.9–10.3)
Chloride: 103 mmol/L (ref 98–111)
Creatinine, Ser: 0.84 mg/dL (ref 0.61–1.24)
GFR, Estimated: >60 mL/min (ref >60)
Glucose, Bld: 120 mg/dL — ABNORMAL HIGH (ref 70–99)
Potassium: 3.5 mmol/L (ref 3.5–5.1)
Sodium: 134 mmol/L — ABNORMAL LOW (ref 135–145)

## 2021-06-29 MED ORDER — SERTRALINE HCL 50 MG PO TABS: 50.0000 mg | ORAL_TABLET | Freq: Every day | ORAL | 2 refills | Status: DC

## 2021-06-29 NOTE — Progress Notes (Signed)
  [facility] Internal Medicine Residency Telephone Encounter Continuity Care Appointment  HPI:  This telephone encounter was created for [name] on 06/29/2021 for the following purpose/cc anxiety.  Patient reports that his nerves have been acting up recently and that he has a lot on his mind.  He reports that his recent new disease diagnosis as well as financial strain and concern for family.  He endorses trouble concentrating difficulty falling asleep.  He states that he feels this way every day.  He denies feeling down or depressed.     Past Medical History:  Past Medical History:  Diagnosis Date   Acute heart failure ([place]) 04/08/2021   Acute HFrEF (heart failure with reduced ejection fraction) ([place]) 04/07/2021   Arthritis    hands, elbows bilaterally   Cancer ([place])    Full dentures    Hypertension    Pt states he doen not have HTN and has never been treated for HTN   Kidney stone on left side last stone 4-5 yrs ago   recurrent (3 episodes)   Panic disorder    pt states has resolved   Syncope    resolved- was related to panic attacks     ROS:  Negative except as per HPI.   Assessment / Plan / Recommendations:  Please see A&P under problem oriented charting for assessment of the patient's acute and chronic medical conditions.  As always, pt is advised that if symptoms worsen or new symptoms arise, they should go to an urgent care facility or to to ER for further evaluation.   Consent and Medical Decision Making:  Patient discussed with Dr. Jimmye Norman This is a telephone encounter between Connor Solomon and Delene Ruffini on 06/29/2021 for anxiety. The visit was conducted with the patient located at home and Delene Ruffini at [facility]. The patient's identity was confirmed using their DOB and current address. The patient has consented to being evaluated through a telephone encounter and understands the associated risks (an examination cannot be done and the patient may need to  come in for an appointment) / benefits (allows the patient to remain at home, decreasing exposure to coronavirus). I personally spent 20 minutes on medical discussion.

## 2021-06-29 NOTE — Assessment & Plan Note (Signed)
Patient with anxiety related to his multiple health conditions. Currently uncontrolled/ not medically managed.   Will start patient on Zoloft $RemoveB'50mg'ZNgmXPKe$ . Patient stating that he feels as though he will be able to tolerate the necessary time for ssri to take effect. Will not prescribe anxiolytic in the meantime.  Patient will need to follow up in 1 month.

## 2021-06-29 NOTE — Telephone Encounter (Signed)
Returned call to patient. States, "I have cancer and heart problems. I need some help with my anxiety." Depression was discussed at [facility] on 8/31 but not anxiety. Tele appt given today with [facility].

## 2021-06-29 NOTE — Telephone Encounter (Signed)
Requesting to speak with a nurse about anxiety medication. Please call pt back.

## 2021-07-02 NOTE — Telephone Encounter (Signed)
Patient left off income and household size off the application. Cannot proceed with sending without that information. Called and left the patient a message, requested a call back with the information.

## 2021-07-03 ENCOUNTER — Encounter: Payer: Self-pay | Admitting: Hematology and Oncology

## 2021-07-03 ENCOUNTER — Other Ambulatory Visit: Payer: Self-pay

## 2021-07-03 ENCOUNTER — Ambulatory Visit (HOSPITAL_COMMUNITY)
Admission: RE | Admit: 2021-07-03 | Discharge: 2021-07-03 | Disposition: A | Discharge status: Home / Self Care | Payer: Medicaid Other | Source: Ambulatory Visit | Attending: Hematology and Oncology | Admitting: Hematology and Oncology

## 2021-07-03 ENCOUNTER — Other Ambulatory Visit (HOSPITAL_COMMUNITY): Payer: Self-pay

## 2021-07-03 ENCOUNTER — Inpatient Hospital Stay: Payer: Medicaid Other

## 2021-07-03 ENCOUNTER — Inpatient Hospital Stay (HOSPITAL_BASED_OUTPATIENT_CLINIC_OR_DEPARTMENT_OTHER): Payer: Medicaid Other | Admitting: Hematology and Oncology

## 2021-07-03 VITALS — BP 129/72 | HR 74 | Temp 97.5°F | Resp 17 | Ht 68.0 in | Wt 154.2 lb

## 2021-07-03 DIAGNOSIS — Z79899 Other long term (current) drug therapy: Secondary | ICD-10-CM | POA: Diagnosis not present

## 2021-07-03 DIAGNOSIS — Z5941 Food insecurity: Secondary | ICD-10-CM | POA: Diagnosis not present

## 2021-07-03 DIAGNOSIS — C880 Waldenstrom macroglobulinemia: Secondary | ICD-10-CM

## 2021-07-03 DIAGNOSIS — R058 Other specified cough: Secondary | ICD-10-CM | POA: Diagnosis not present

## 2021-07-03 DIAGNOSIS — R5383 Other fatigue: Secondary | ICD-10-CM | POA: Diagnosis not present

## 2021-07-03 DIAGNOSIS — R630 Anorexia: Secondary | ICD-10-CM | POA: Diagnosis not present

## 2021-07-03 DIAGNOSIS — R252 Cramp and spasm: Secondary | ICD-10-CM | POA: Diagnosis not present

## 2021-07-03 DIAGNOSIS — D472 Monoclonal gammopathy: Secondary | ICD-10-CM | POA: Diagnosis not present

## 2021-07-03 DIAGNOSIS — D649 Anemia, unspecified: Secondary | ICD-10-CM | POA: Diagnosis not present

## 2021-07-03 DIAGNOSIS — J189 Pneumonia, unspecified organism: Secondary | ICD-10-CM | POA: Diagnosis not present

## 2021-07-03 DIAGNOSIS — I5022 Chronic systolic (congestive) heart failure: Secondary | ICD-10-CM | POA: Diagnosis not present

## 2021-07-03 DIAGNOSIS — Z596 Low income: Secondary | ICD-10-CM | POA: Diagnosis not present

## 2021-07-03 DIAGNOSIS — Z8249 Family history of ischemic heart disease and other diseases of the circulatory system: Secondary | ICD-10-CM | POA: Diagnosis not present

## 2021-07-03 DIAGNOSIS — R110 Nausea: Secondary | ICD-10-CM | POA: Diagnosis not present

## 2021-07-03 DIAGNOSIS — R197 Diarrhea, unspecified: Secondary | ICD-10-CM | POA: Diagnosis not present

## 2021-07-03 DIAGNOSIS — R590 Localized enlarged lymph nodes: Secondary | ICD-10-CM | POA: Diagnosis not present

## 2021-07-03 DIAGNOSIS — R0602 Shortness of breath: Secondary | ICD-10-CM | POA: Diagnosis not present

## 2021-07-03 DIAGNOSIS — Z5112 Encounter for antineoplastic immunotherapy: Secondary | ICD-10-CM | POA: Diagnosis not present

## 2021-07-03 DIAGNOSIS — Z87891 Personal history of nicotine dependence: Secondary | ICD-10-CM | POA: Diagnosis not present

## 2021-07-03 DIAGNOSIS — Z885 Allergy status to narcotic agent status: Secondary | ICD-10-CM | POA: Diagnosis not present

## 2021-07-03 DIAGNOSIS — R519 Headache, unspecified: Secondary | ICD-10-CM | POA: Diagnosis not present

## 2021-07-03 DIAGNOSIS — I10 Essential (primary) hypertension: Secondary | ICD-10-CM | POA: Diagnosis not present

## 2021-07-03 LAB — CMP (CANCER CENTER ONLY)
ALT: 6 U/L (ref 0–44)
AST: 8 U/L — ABNORMAL LOW (ref 15–41)
Albumin: 3 g/dL — ABNORMAL LOW (ref 3.5–5.0)
Alkaline Phosphatase: 71 U/L (ref 38–126)
Anion gap: 9 (ref 5–15)
BUN: 10 mg/dL (ref 6–20)
CO2: 24 mmol/L (ref 22–32)
Calcium: 8.6 mg/dL — ABNORMAL LOW (ref 8.9–10.3)
Chloride: 99 mmol/L (ref 98–111)
Creatinine: 0.76 mg/dL (ref 0.61–1.24)
GFR, Estimated: >60 mL/min (ref >60)
Glucose, Bld: 105 mg/dL — ABNORMAL HIGH (ref 70–99)
Potassium: 3.5 mmol/L (ref 3.5–5.1)
Sodium: 132 mmol/L — ABNORMAL LOW (ref 135–145)
Total Bilirubin: 0.7 mg/dL (ref 0.3–1.2)
Total Protein: 5.9 g/dL — ABNORMAL LOW (ref 6.5–8.1)

## 2021-07-03 LAB — CBC WITH DIFFERENTIAL (CANCER CENTER ONLY)
Abs Immature Granulocytes: 0.04 10*3/uL (ref 0.00–0.07)
Basophils Absolute: 0 10*3/uL (ref 0.0–0.1)
Basophils Relative: 0 %
Eosinophils Absolute: 0.3 10*3/uL (ref 0.0–0.5)
Eosinophils Relative: 3 %
HCT: 32.6 % — ABNORMAL LOW (ref 39.0–52.0)
Hemoglobin: 10.5 g/dL — ABNORMAL LOW (ref 13.0–17.0)
Immature Granulocytes: 0 %
Lymphocytes Relative: 12 %
Lymphs Abs: 1.1 10*3/uL (ref 0.7–4.0)
MCH: 25.7 pg — ABNORMAL LOW (ref 26.0–34.0)
MCHC: 32.2 g/dL (ref 30.0–36.0)
MCV: 79.9 fL — ABNORMAL LOW (ref 80.0–100.0)
Monocytes Absolute: 1 10*3/uL (ref 0.1–1.0)
Monocytes Relative: 11 %
Neutro Abs: 6.7 10*3/uL (ref 1.7–7.7)
Neutrophils Relative %: 74 %
Platelet Count: 202 10*3/uL (ref 150–400)
RBC: 4.08 MIL/uL — ABNORMAL LOW (ref 4.22–5.81)
RDW: 19.9 % — ABNORMAL HIGH (ref 11.5–15.5)
WBC Count: 9 10*3/uL (ref 4.0–10.5)
nRBC: 0 % (ref 0.0–0.2)

## 2021-07-03 LAB — LACTATE DEHYDROGENASE: LDH: 95 U/L — ABNORMAL LOW (ref 98–192)

## 2021-07-03 MED ORDER — ONDANSETRON HCL 8 MG PO TABS
8.0000 mg | ORAL_TABLET | Freq: Three times a day (TID) | ORAL | 0 refills | Status: DC | PRN
  Filled 2021-07-03: qty 30, 10d supply, fill #0

## 2021-07-03 MED ORDER — AZITHROMYCIN 250 MG PO TABS
ORAL_TABLET | ORAL | 0 refills | Status: DC
  Filled 2021-07-03: qty 6, 5d supply, fill #0

## 2021-07-03 NOTE — Progress Notes (Signed)
[place] [phone]   [phone]  PROGRESS NOTE  Patient Care Team: Delene Ruffini, MD as PCP - General (Internal Medicine)  Hematological/Oncological History # Waldenstrom's Macroglobulinemia # IgM Monoclonal Gammopathy 04/07/2021: CT A/p showed mildly enlarged retroperitoneal lymph nodes and bilateral inguinal lymph nodes.  04/08/2021: IgM Kappa M protein 1.2 04/09/2021: WBC 11.1, Hgb 9.5, MCV 75.4, Plt 525 04/20/2021: Kappa 977, Lambda 7.8, K/L ratio 125.28. Serum viscosity 1.8 05/06/2021: Establish care with Dr. 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 Infusion reaction with ritux, held halfway through.  06/12/2021: Cycle 2 Day 1 of Ibrutinib + Rituximab 06/19/2021: Cycle 3 Day 1 of Ibrutinib + Rituximab 06/26/2021: completed 4 weeks of rituximab. Continued on daily ibrutinib   Interval History:  Connor Solomon 60 y.o. male with medical history significant for Waldenstrom's macroglobulinemia who presents for a follow up visit. The patient's last visit was on 06/19/2021. In the interim since the last visit he has completed 4 weeks of Rituximab.  On exam today Connor Solomon notes he has not felt well over the last week.  He was on Monday he began developing shortness of breath and some chest discomfort.  He describes it as tightness he has a cough productive for yellowish/greenish sputum.  He has been having headaches and reports he did have a temperature earlier this week.  He notes he is having fatigue, nausea, diarrhea and poor appetite.  His diarrhea has been responding well to Imodium therapy.  He notes he is feeling great until Monday when all the symptoms began.  Full 10 point ROS is listed below.  MEDICAL HISTORY:  Past Medical History:  Diagnosis Date   Acute  heart failure ([place]) 04/08/2021   Acute HFrEF (heart failure with reduced ejection fraction) ([place]) 04/07/2021   Arthritis    hands, elbows bilaterally   Cancer (HCC)    Full dentures    Hypertension    Pt states he doen not have HTN and has never been treated for HTN   Kidney stone on left side last stone 4-5 yrs ago   recurrent (3 episodes)   Panic disorder    pt states has resolved   Syncope    resolved- was related to panic attacks    SURGICAL HISTORY: Past Surgical History:  Procedure Laterality Date   CARPAL METACARPAL FUSION WITH DISTAL RADIAL BONE GRAFT Left 05/30/2013   Procedure: LEFT THUMB METACARPAL JOINT FUSION;  Surgeon: Schuyler Amor, MD;  Location: [place];  Service: Orthopedics;  Laterality: Left;   ESOPHAGOGASTRODUODENOSCOPY N/A 02/12/2014   Procedure: ESOPHAGOGASTRODUODENOSCOPY (EGD);  Surgeon: Jerene Bears, MD;  Location: [facility] ENDOSCOPY;  Service: Endoscopy;  Laterality: N/A;   FOREIGN BODY REMOVAL N/A 02/12/2014   Procedure: FOREIGN BODY REMOVAL;  Surgeon: Jerene Bears, MD;  Location: [place];  Service: Endoscopy;  Laterality: N/A;   OPEN REDUCTION INTERNAL FIXATION (ORIF) DISTAL RADIAL FRACTURE Left 11/29/2012   Procedure: OPEN REDUCTION INTERNAL FIXATION (ORIF) DISTAL RADIAL FRACTURE;  Surgeon: Schuyler Amor, MD;  Location: [place];  Service: Orthopedics;  Laterality: Left;  LEFT DISTAL RADIUS OSTEOTOMY WITH BONE GRAFT   RIGHT/LEFT HEART CATH AND CORONARY ANGIOGRAPHY N/A 04/09/2021   Procedure: RIGHT/LEFT HEART CATH AND CORONARY ANGIOGRAPHY;  Surgeon: Jolaine Artist, MD;  Location: [place] CV LAB;  Service: Cardiovascular;  Laterality: N/A;  TENDON REPAIR Left 02/07/2013   Procedure: LEFT EXTENSOR INDICIS PROPRIUS  TO EXTENSOR POLLICIS LONGUS TENDON TRANSFER;  Surgeon: Schuyler Amor, MD;  Location: [place];  Service: Orthopedics;  Laterality: Left;   WISDOM TOOTH EXTRACTION     WRIST SURGERY      fx only    SOCIAL HISTORY: Social History   Socioeconomic History   Marital status: Single    Spouse name: Not on file   Number of children: Not on file   Years of education: Not on file   Highest education level: Not on file  Occupational History   Not on file  Tobacco Use   Smoking status: Former    Types: Cigars    Quit date: 10/11/2020    Years since quitting: 0.7   Smokeless tobacco: Never   Tobacco comments:    smokes 1 cigar a week  Substance and Sexual Activity   Alcohol use: No   Drug use: No   Sexual activity: Not on file  Other Topics Concern   Not on file  Social History Narrative   Not on file   Social Determinants of Health   Financial Resource Strain: High Risk   Difficulty of Paying Living Expenses: Hard  Food Insecurity: Food Insecurity Present   Worried About Running Out of Food in the Last Year: Sometimes true   Ran Out of Food in the Last Year: Sometimes true  Transportation Needs: No Transportation Needs   Lack of Transportation (Medical): No   Lack of Transportation (Non-Medical): No  Physical Activity: Not on file  Stress: Not on file  Social Connections: Not on file  Intimate Partner Violence: Not on file    FAMILY HISTORY: Family History  Problem Relation Age of Onset   Heart attack Father    Sudden Cardiac Death Father 80   Diabetes Neg Hx    Hyperlipidemia Neg Hx    Hypertension Neg Hx     ALLERGIES:  is allergic to rituxan [rituximab] and hydrocodone.  MEDICATIONS:  Current Outpatient Medications  Medication Sig Dispense Refill   azithromycin (ZITHROMAX Z-PAK) 250 MG tablet Take 2 tablet by mouth on day 1 then 1 tablet daily for 4 additional days 6 each 0   ondansetron (ZOFRAN) 8 MG tablet Take 1 tablet (8 mg total) by mouth every 8 (eight) hours as needed. 30 tablet 0   acetaminophen (TYLENOL) 500 MG tablet Take 500 mg by mouth every 6 (six) hours as needed.     albuterol (PROAIR HFA) 108 (90 Base) MCG/ACT inhaler Inhale  2 puffs into the lungs every 6 (six) hours as needed for wheezing or shortness of breath. 8.5 g 2   allopurinol (ZYLOPRIM) 300 MG tablet Take 1 tablet (300 mg total) by mouth daily. 90 tablet 1   carvedilol (COREG) 3.125 MG tablet Take 1 tablet (3.125 mg total) by mouth 2 (two) times daily. 68 tablet 2   ferrous sulfate 325 (65 FE) MG tablet Take 1 tablet (325 mg total) by mouth daily with breakfast. 30 tablet 7   furosemide (LASIX) 20 MG tablet Take 1 tablet (20 mg total) by mouth daily as needed for swelling 100 tablet 2   ibrutinib 420 MG TABS Take 420 mg by mouth daily. 30 tablet 2   oxyCODONE-acetaminophen (PERCOCET) 5-325 MG tablet Take 1 tablet by mouth every 6 (six) hours as needed for severe pain. 60 tablet 0   sacubitril-valsartan (ENTRESTO) 24-26 MG Take 1 tablet by mouth 2 (two) times  daily. 60 tablet 5   sertraline (ZOLOFT) 50 MG tablet Take 1 tablet (50 mg total) by mouth daily. (Patient not taking: Reported on 07/03/2021) 30 tablet 2   No current facility-administered medications for this visit.    REVIEW OF SYSTEMS:   Constitutional: ( - ) fevers, ( - )  chills , ( - ) night sweats Eyes: ( - ) blurriness of vision, ( - ) double vision, ( - ) watery eyes Ears, nose, mouth, throat, and face: ( - ) mucositis, ( - ) sore throat Respiratory: ( - ) cough, ( - ) dyspnea, ( - ) wheezes Cardiovascular: ( - ) palpitation, ( - ) chest discomfort, ( - ) lower extremity swelling Gastrointestinal:  ( - ) nausea, ( - ) heartburn, ( - ) change in bowel habits Skin: ( - ) abnormal skin rashes Lymphatics: ( - ) new lymphadenopathy, ( - ) easy bruising Neurological: ( - ) numbness, ( - ) tingling, ( - ) new weaknesses Behavioral/Psych: ( - ) mood change, ( - ) new changes  All other systems were reviewed with the patient and are negative.  PHYSICAL EXAMINATION: ECOG PERFORMANCE STATUS: 1 - Symptomatic but completely ambulatory  Vitals:   07/03/21 1125  BP: 129/72  Pulse: 74  Resp: 17   Temp: (!) 97.5 F (36.4 C)  SpO2: 100%   Filed Weights   07/03/21 1125  Weight: 154 lb 3.2 oz (69.9 kg)    GENERAL: Well-appearing middle-aged Caucasian male, alert, no distress and comfortable SKIN: skin color, texture, turgor are normal, no rashes or significant lesions EYES: conjunctiva are pink and non-injected, sclera clear LUNGS: clear to auscultation and percussion with normal breathing effort HEART: regular rate & rhythm and no murmurs and no lower extremity edema  PSYCH: alert & oriented x 3, fluent speech NEURO: no focal motor/sensory deficits  LABORATORY DATA:  I have reviewed the data as listed CBC Latest Ref Rng & Units 07/03/2021 06/26/2021 06/19/2021  WBC 4.0 - 10.5 K/uL 9.0 6.0 5.6  Hemoglobin 13.0 - 17.0 g/dL 10.5(L) 11.2(L) 9.9(L)  Hematocrit 39.0 - 52.0 % 32.6(L) 34.7(L) 31.9(L)  Platelets 150 - 400 K/uL 202 248 288    CMP Latest Ref Rng & Units 07/03/2021 06/29/2021 06/26/2021  Glucose 70 - 99 mg/dL 105(H) 120(H) 87  BUN 6 - 20 mg/dL $Remove'10 7 11  'GFJYMKf$ Creatinine 0.61 - 1.24 mg/dL 0.76 0.84 0.82  Sodium 135 - 145 mmol/L 132(L) 134(L) 136  Potassium 3.5 - 5.1 mmol/L 3.5 3.5 3.8  Chloride 98 - 111 mmol/L 99 103 106  CO2 22 - 32 mmol/L 24 22 21(L)  Calcium 8.9 - 10.3 mg/dL 8.6(L) 8.5(L) 8.7(L)  Total Protein 6.5 - 8.1 g/dL 5.9(L) - 5.9(L)  Total Bilirubin 0.3 - 1.2 mg/dL 0.7 - 0.4  Alkaline Phos 38 - 126 U/L 71 - 80  AST 15 - 41 U/L 8(L) - 7(L)  ALT 0 - 44 U/L 6 - 6    Lab Results  Component Value Date   MPROTEIN 1.2 (H) 04/08/2021   Lab Results  Component Value Date   KAPLAMBRATIO 875.18 (H) 05/12/2021    RADIOGRAPHIC STUDIES: No results found.  ASSESSMENT & PLAN Connor Solomon 60 y.o. male with medical history significant for Waldenstrom's macroglobulinemia who presents for a follow up visit.   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  There is no  clear evidence of hyperviscosity syndrome at this time.  At this time we will plan to proceed with ibrutinib and rituximab therapy.  One could also consider Bendamustine and rituximab therapy but given the patient's degree of anemia I would prefer pursuing ibrutinib and rituximab at this time.  Additionally ibrutinib/rituximab are category 1 recommendations per the NCCN guidelines.  The treatment will consist of ibrutinib 420 mg p.o. daily with rituximab on weeks 1 through 4 as well as weeks 27 through 30.  Previously we discussed the risks and benefits of therapy including (but not limited to) risk of bleeding, fatigue, atypical infections, and cardiac arrhythmias.  The patient voices understanding of this plan moving forward.  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  Recommend the patient continue p.o. iron therapy --Continue to monitor.  #Supportive Care -- chemotherapy education complete -- port placement not required -- allopurinol 300mg  PO daily for TLS prophylaxis   Orders Placed This Encounter  Procedures   DG Chest 2 View    Standing Status:   Future     Number of Occurrences:   1    Standing Expiration Date:   07/03/2022    Order Specific Question:   Reason for Exam (SYMPTOM  OR DIAGNOSIS REQUIRED)    Answer:   cough, shortness of breath, concern for pneumonia    Order Specific Question:   Preferred imaging location?    Answer:   [facility]   24-Hr Ur UPEP/UIFE/Light Chains/TP    Standing Status:   Future    Number of Occurrences:   1    Standing Expiration Date:   07/03/2022    All questions were answered. The patient knows to call the clinic with any problems, questions or concerns.  A total of more than 30 minutes were spent on this encounter with face-to-face time and non-face-to-face time, including preparing to see the patient, ordering tests and/or medications, counseling the patient and coordination of care as outlined above.   Ledell Peoples, MD Department of Hematology/Oncology [place] at [facility] Phone: [phone] Pager: [phone] Email: [email or web address]  07/03/2021 12:25 PM  Dimopoulos MA, Rennie Natter, [place], Mahe B, Herbaux C, Tam C, Orsucci L, [place] ML, Matous JV, [place] C, Kastritis E, [place], Li J, Salman Z, Graef T, Buske C; iNNOVATE Study Group and the [employer] for [employer]. Phase 3 Trial of Ibrutinib plus Rituximab in Waldenstrm's Macroglobulinemia. Alison Stalling J Med. 2018 Jun 21;378(25):2399-2410.   --At 30 months, the progression-free survival rate was 82% with ibrutinib-rituximab versus 28% with placebo-rituximab (hazard ratio for progression or death, 0.20; P<0.001).

## 2021-07-03 NOTE — Progress Notes (Signed)
PCP: Juluis M[facility] HF Cardiologist: Dr. Haroldine Laws  HPI:  Connor Solomon is a 60 y.o. WM w/ h/o HTN, new IDA and new systolic HF.    Patient had stress echo in 2010 for CP showing no stress arrhythmias or conduction abnormalities. The stress ECG was negative for ischemia. There was no echocardiographic evidence for stress-induced ischemia. EF normal. In 2011, he had syncope and was evaluated by Dr. Harrington Challenger but this was felt to be vasovagal syncope. No further cardiac w/u.    Tested + for COVID 02/2020. Did not require hospitalization but hasn't felt well since. Not vaccinated.   For the last 6 months he has noted marked fatigue + 20 lb unintentional weight loss and some occasional night sweats.   He presented to ED 04/07/21 where he was found to be in acute CHF. CXR suspicious for bronchopneumonia. Also could not r/o underlying malignancy. CT + for moderate bilateral pleural effusions and patchy airspace opacities consistent with multifocal pneumonia. He was found to be anemic w/ hgb 8.6.    Admitted by IM and started on IV Lasix. Echo showed severely reduced LVEF, 20-25% w/ glogal HK, Grade III DD (restrictive), RV normal. Mod-severe MR. No LVH. 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 ESR 142, autoimmune workup ensued. cMRI consistent with NICM, EF 25%.  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 Discharged on spironolactone, carvedilol and losartan (bp too soft for Entresto).   Seen by Dr. Lorenso Courier with Heme/Onc. 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 Started Ibrutinib + Rituximab 06/05/21.    Recently returned to [facility] for HF follow up 06/22/21. Feeling better since starting CA treatments, appetite slowly improving. Still had fluttering sensation in chest and he was SOB/fatigued walking on flat ground short distances. Was sleeping on  3-4 pillows. Denied  CP, dizziness, edema.  Weight at home was 158 pounds. Reported taking all medications. Struggling with anxiety.  Today he returns to HF clinic for pharmacist medication titration. At last visit with APP, losartan was discontinued and Entresto 24/26 mg BID was initiated. He was also instructed to take furosemide 20 mg daily x3 days then continue furosemide PRN only given elevated volume status in clinic (ReDS 40%).  On 06/26/21 he completed 4 weeks of rituximab and was continued on daily ibrutinib. Not feeling very well today. Is feeling very tired and has some nausea. Getting over recent "chest cold"; completed azithromycin course. He has had episodes of dizziness lately, especially when moving from a sitting to a standing position. He fell last week because of dizziness and hurt his wrist. He notes that he has started feeling a "fluttering" in his chest the last 2 weeks. This happens about 4-5 times per day. Not currently experiencing the fluttering sensation and EKG today showed NSR. Of note, Imbruvica can cause Afib/AF. He notes that since the fluttering started he has been more SOB. Gets SOB when walking about 20 yards. Now using a walker. Unable to check weights at home because his scale is not working. Weight in clinic down 3 lbs from last visit. He has had some leg swelling and took Lasix 3 times last week. Did not take any yesterday or today. No LEE on exam. + for PND/orthopnea; states he is now sleeping on 3 pillows instead of 2. REDS 34% today, down from 40% last visit.     HF Medications: Carvedilol 3.125 mg BID Entresto 24/26  mg BID Furosemide 20 mg PRN   Does the patient have any problems obtaining medications due to transportation or finances?   No insurance. Will apply for Entresto patient assistance today. Will get Jardiance through HF fund at [facility] (see below).  Understanding of regimen: fair Understanding of indications: fair Potential of compliance: good Patient  understands to avoid NSAIDs. Patient understands to avoid decongestants.    Pertinent Lab Values: 07/03/21: Serum creatinine 0.76, BUN 10, Potassium 3.5, Sodium 132,  Vital Signs: Weight: 151.4 lbs (last clinic weight: 154.3 lbs) Blood pressure: 134/74  Heart rate: 71   Assessment/Plan: 1. Chronic Systolic Heart Failure - Stress Echo 2010: normal EF. No evidence of ischemia - Echo 03/2021: EF 20-25%, GIIDD (restrictive), no LVH. RV normal - ? Viral CM (COVID + 2021), ? Familia (FH of SCD). TSH normal, HIV pending - LHC/RHC (03/2021): with normal cors, low filling pressures, and normal cardiac output - Also ? Infiltrative CM. cMRI EF 25%, normal RV, Basal septal midwall LGE, mild MR.  - MM panel urine immunofixation: IgM monoclonal protein with kappa light chain  specificity. - NYHA IIIb. Appears euvolemic on exam, REDS decreased from 40% to 34% today, weight down 3 lbs from last visit. He does note PND/orthopnea and has used PRN Lasix at home for leg swelling but no LEE today.  - Continue furosemide 20 mg PRN - Continue carvedilol 3.125 mg BID. - Continue Entresto 24/26 mg BID. - Start Jardiance 10 mg daily. May help with fluid and decrease the amount of PRN furosemide he is needing.  - Off spironolactone with gynecomastia (will hold off starting eplerenone as he has no insurance). - Zio patch placed today to assess new palpitations.    2.  Mitral Regurgitation - Mod-severe, likely functional from dilated LV.   3.  Iron Deficiency Anemia - Per PCP. - Recent Hgb 9.5.       4.  Waldenstrom's macroglobulinemia. - Now followed by Dr. Lorenso Courier with Heme/Onc - Now on Ibrutinib + Rituximab   5. SDOH - HFSW assisting with hospital bills and insurance.   6. Anxiety - Would likely benefit from [facility] but he refused sertraline.  Follow up 3 weeks with [facility].   Audry Riles, PharmD, BCPS, BCCP, CPP Heart Failure Clinic Pharmacist [phone]

## 2021-07-06 ENCOUNTER — Telehealth: Payer: Self-pay | Admitting: *Deleted

## 2021-07-06 NOTE — Telephone Encounter (Signed)
-----   Message from Orson Slick, MD sent at 07/03/2021  4:41 PM EDT ----- Please let Mr. Fitzwater know that there is no evidence of a pneumonia on the Xray. Recommend he continue to antibiotics as this may help if it is an inflammatory issue.   ----- Message ----- From: Interface, Rad Results In Sent: 07/03/2021   4:24 PM EDT To: Orson Slick, MD

## 2021-07-06 NOTE — Telephone Encounter (Signed)
[name] patient regarding recent chest xray. Spoke with patient and informed him that his recent chest xray did not show any pneumonia. Dr. Lorenso Courier does recommend he finish his antibiotics. Connor Solomon states he does feel better already on the ABX. He states he is not coughing as much and generally feels better. He is aware of his upcoming appts.

## 2021-07-08 ENCOUNTER — Telehealth: Payer: Self-pay | Admitting: *Deleted

## 2021-07-08 ENCOUNTER — Encounter: Payer: Self-pay | Admitting: General Practice

## 2021-07-08 ENCOUNTER — Other Ambulatory Visit: Payer: Self-pay | Admitting: *Deleted

## 2021-07-08 DIAGNOSIS — C880 Waldenstrom macroglobulinemia: Secondary | ICD-10-CM

## 2021-07-08 NOTE — Telephone Encounter (Signed)
Received vm message from pt requesting social work referral as he states he has financial worries. Referral sent

## 2021-07-08 NOTE — Progress Notes (Signed)
[facility] CSW Progress Notes  Referral placed by E Tracey RN, patient calls requesting social work assistance for financial distress.  Called patient.  He is waiting on decision on his disability application.  Neither he nor his partner have income at this time.  He has received a small grant from [employer].  He was also approved for [employer] by [employer], he has not submitted any requests for funds.  Encouraged him to Personal assistant.  Messaged [facility] V[facility] SW as they have also assisted him in in the past.  Made appointment for him to enroll in [facility] at his next clinic visit, he can receive up to 4 $50 disbursements while he is in active treatment.  Edwyna Shell, LCSW Clinical Social Worker Phone:  [phone]

## 2021-07-09 ENCOUNTER — Telehealth (HOSPITAL_COMMUNITY): Payer: Self-pay | Admitting: Licensed Clinical Social Worker

## 2021-07-09 DIAGNOSIS — Z5112 Encounter for antineoplastic immunotherapy: Secondary | ICD-10-CM | POA: Diagnosis not present

## 2021-07-09 DIAGNOSIS — Z596 Low income: Secondary | ICD-10-CM | POA: Diagnosis not present

## 2021-07-09 DIAGNOSIS — R110 Nausea: Secondary | ICD-10-CM | POA: Diagnosis not present

## 2021-07-09 DIAGNOSIS — R058 Other specified cough: Secondary | ICD-10-CM | POA: Diagnosis not present

## 2021-07-09 DIAGNOSIS — Z79899 Other long term (current) drug therapy: Secondary | ICD-10-CM | POA: Diagnosis not present

## 2021-07-09 DIAGNOSIS — Z885 Allergy status to narcotic agent status: Secondary | ICD-10-CM | POA: Diagnosis not present

## 2021-07-09 DIAGNOSIS — C880 Waldenstrom macroglobulinemia: Secondary | ICD-10-CM | POA: Diagnosis not present

## 2021-07-09 DIAGNOSIS — Z5941 Food insecurity: Secondary | ICD-10-CM | POA: Diagnosis not present

## 2021-07-09 DIAGNOSIS — Z87891 Personal history of nicotine dependence: Secondary | ICD-10-CM | POA: Diagnosis not present

## 2021-07-09 DIAGNOSIS — R519 Headache, unspecified: Secondary | ICD-10-CM | POA: Diagnosis not present

## 2021-07-09 DIAGNOSIS — R630 Anorexia: Secondary | ICD-10-CM | POA: Diagnosis not present

## 2021-07-09 DIAGNOSIS — D472 Monoclonal gammopathy: Secondary | ICD-10-CM | POA: Diagnosis not present

## 2021-07-09 DIAGNOSIS — I10 Essential (primary) hypertension: Secondary | ICD-10-CM | POA: Diagnosis not present

## 2021-07-09 DIAGNOSIS — D649 Anemia, unspecified: Secondary | ICD-10-CM | POA: Diagnosis not present

## 2021-07-09 DIAGNOSIS — R197 Diarrhea, unspecified: Secondary | ICD-10-CM | POA: Diagnosis not present

## 2021-07-09 DIAGNOSIS — R0602 Shortness of breath: Secondary | ICD-10-CM | POA: Diagnosis not present

## 2021-07-09 DIAGNOSIS — R590 Localized enlarged lymph nodes: Secondary | ICD-10-CM | POA: Diagnosis not present

## 2021-07-09 DIAGNOSIS — R252 Cramp and spasm: Secondary | ICD-10-CM | POA: Diagnosis not present

## 2021-07-09 DIAGNOSIS — I5022 Chronic systolic (congestive) heart failure: Secondary | ICD-10-CM | POA: Diagnosis not present

## 2021-07-09 DIAGNOSIS — R5383 Other fatigue: Secondary | ICD-10-CM | POA: Diagnosis not present

## 2021-07-09 DIAGNOSIS — Z8249 Family history of ischemic heart disease and other diseases of the circulatory system: Secondary | ICD-10-CM | POA: Diagnosis not present

## 2021-07-09 NOTE — Telephone Encounter (Signed)
CSW received message from cancer center CSW inquiring if there were any further financial assistance options for pt as he had expressed concerns.  CSW called pt and left message to discuss current hardships and see how we could assist- awaiting return call  Connor Solomon, [facility] Desk#: [phone] Cell#: [phone]

## 2021-07-10 ENCOUNTER — Other Ambulatory Visit: Payer: Self-pay | Admitting: Hematology and Oncology

## 2021-07-10 ENCOUNTER — Telehealth: Payer: Self-pay | Admitting: *Deleted

## 2021-07-10 DIAGNOSIS — N62 Hypertrophy of breast: Secondary | ICD-10-CM

## 2021-07-10 MED ORDER — OXYCODONE-ACETAMINOPHEN 5-325 MG PO TABS: 1.0000 | ORAL_TABLET | Freq: Four times a day (QID) | ORAL | 0 refills | Status: DC | PRN

## 2021-07-10 NOTE — Telephone Encounter (Signed)
Received call from patient. He states he had a fall last night and might have injured his wrist. He is asking what he should do. Advised that he should go to an Urgent Care or his PCP to have his wrist evaluated.  Advised that Dr. Lorenso Courier is sending in a refill for his pain medication.  Pt voiced understanding.

## 2021-07-13 LAB — UPEP/UIFE/LIGHT CHAINS/TP, 24-HR UR
% BETA, Urine: 15.9 %
ALPHA 1 URINE: 11.5 %
Albumin, U: 20 %
Alpha 2, Urine: 12.3 %
Free Kappa Lt Chains,Ur: 206.12 mg/L — ABNORMAL HIGH (ref 1.17–86.46)
Free Kappa/Lambda Ratio: 136.5 — ABNORMAL HIGH (ref 1.83–14.26)
Free Lambda Lt Chains,Ur: 1.51 mg/L (ref 0.27–15.21)
GAMMA GLOBULIN URINE: 40.3 %
M-SPIKE %, Urine: 22.4 % — ABNORMAL HIGH
M-Spike, Mg/24 Hr: 9 mg/24 hr — ABNORMAL HIGH
Total Protein, Urine-Ur/day: 39 mg/24 hr (ref 30–150)
Total Protein, Urine: 8.3 mg/dL
Total Volume: 475

## 2021-07-15 ENCOUNTER — Other Ambulatory Visit: Payer: Self-pay

## 2021-07-15 ENCOUNTER — Ambulatory Visit (HOSPITAL_COMMUNITY)
Admission: RE | Admit: 2021-07-15 | Discharge: 2021-07-15 | Disposition: A | Discharge status: Home / Self Care | Payer: Self-pay | Source: Ambulatory Visit | Attending: Internal Medicine | Admitting: Internal Medicine

## 2021-07-15 ENCOUNTER — Other Ambulatory Visit (HOSPITAL_COMMUNITY): Payer: Self-pay

## 2021-07-15 ENCOUNTER — Ambulatory Visit (HOSPITAL_COMMUNITY)
Admission: RE | Admit: 2021-07-15 | Discharge: 2021-07-15 | Disposition: A | Discharge status: Home / Self Care | Payer: Medicaid Other | Source: Ambulatory Visit | Attending: Cardiology | Admitting: Cardiology

## 2021-07-15 ENCOUNTER — Encounter: Payer: Self-pay | Admitting: Hematology and Oncology

## 2021-07-15 ENCOUNTER — Other Ambulatory Visit (HOSPITAL_COMMUNITY): Payer: Self-pay | Admitting: Internal Medicine

## 2021-07-15 VITALS — BP 134/74 | HR 71 | Wt 151.4 lb

## 2021-07-15 DIAGNOSIS — Z8616 Personal history of COVID-19: Secondary | ICD-10-CM | POA: Diagnosis not present

## 2021-07-15 DIAGNOSIS — R110 Nausea: Secondary | ICD-10-CM | POA: Insufficient documentation

## 2021-07-15 DIAGNOSIS — Z09 Encounter for follow-up examination after completed treatment for conditions other than malignant neoplasm: Secondary | ICD-10-CM | POA: Insufficient documentation

## 2021-07-15 DIAGNOSIS — F419 Anxiety disorder, unspecified: Secondary | ICD-10-CM | POA: Insufficient documentation

## 2021-07-15 DIAGNOSIS — R42 Dizziness and giddiness: Secondary | ICD-10-CM | POA: Insufficient documentation

## 2021-07-15 DIAGNOSIS — M7989 Other specified soft tissue disorders: Secondary | ICD-10-CM | POA: Insufficient documentation

## 2021-07-15 DIAGNOSIS — D509 Iron deficiency anemia, unspecified: Secondary | ICD-10-CM | POA: Diagnosis not present

## 2021-07-15 DIAGNOSIS — R002 Palpitations: Secondary | ICD-10-CM

## 2021-07-15 DIAGNOSIS — I340 Nonrheumatic mitral (valve) insufficiency: Secondary | ICD-10-CM | POA: Diagnosis not present

## 2021-07-15 DIAGNOSIS — I5022 Chronic systolic (congestive) heart failure: Secondary | ICD-10-CM | POA: Insufficient documentation

## 2021-07-15 DIAGNOSIS — Z79899 Other long term (current) drug therapy: Secondary | ICD-10-CM | POA: Insufficient documentation

## 2021-07-15 DIAGNOSIS — R0601 Orthopnea: Secondary | ICD-10-CM | POA: Diagnosis not present

## 2021-07-15 DIAGNOSIS — C880 Waldenstrom macroglobulinemia: Secondary | ICD-10-CM | POA: Insufficient documentation

## 2021-07-15 DIAGNOSIS — Z7901 Long term (current) use of anticoagulants: Secondary | ICD-10-CM | POA: Insufficient documentation

## 2021-07-15 DIAGNOSIS — R0602 Shortness of breath: Secondary | ICD-10-CM | POA: Insufficient documentation

## 2021-07-15 MED ORDER — EMPAGLIFLOZIN 10 MG PO TABS
10.0000 mg | ORAL_TABLET | Freq: Every day | ORAL | 11 refills | Status: DC
  Filled 2021-07-15: qty 30, 30d supply, fill #0
  Filled 2021-08-25: qty 30, 30d supply, fill #1
  Filled 2021-09-26: qty 30, 30d supply, fill #2
  Filled 2021-11-29: qty 30, 30d supply, fill #3
  Filled 2022-01-18: qty 30, 30d supply, fill #4
  Filled 2022-02-23: qty 30, 30d supply, fill #5
  Filled 2022-03-23: qty 30, 30d supply, fill #6
  Filled 2022-04-24: qty 30, 30d supply, fill #7
  Filled 2022-05-31: qty 30, 30d supply, fill #8
  Filled 2022-06-19 – 2022-07-01 (×2): qty 30, 30d supply, fill #9

## 2021-07-15 NOTE — Progress Notes (Signed)
Per Allena Katz, FNP pt needs 2 wk Zio for palps. Pt has no insurance. Called [employer] at [phone], pt with HH size of 2 and income of $10,000/yr. Per Lenna Sciara pt is pre-qualified for pt assistance with a reduced rate of $0, ref # [id number]. She states pt will receive a bill from them for $395, when he receives this he will need to call their billing dept and advise he was pre-qualified for assistance, they will then mail him a form to complete and return along with proof of income. Pt is aware and agreeable to proceed.  Zio patch placed onto patient.  All instructions and information reviewed with patient, they verbalize understanding with no questions.

## 2021-07-15 NOTE — Patient Instructions (Addendum)
It was a pleasure seeing you today!  MEDICATIONS: -We are changing your medications today -Start Jardiance 10 mg (1 tablet) daily -We will place 14 day Zio patch today.  -Call if you have questions about your medications.  LABS: -We will call you if your labs need attention.  NEXT APPOINTMENT: Return to clinic in 3 weeks with [facility].  In general, to take care of your heart failure: -Limit your fluid intake to 2 Liters (half-gallon) per day.   -Limit your salt intake to ideally 2-3 grams (2000-3000 mg) per day. -Weigh yourself daily and record, and bring that "weight diary" to your next appointment.  (Weight gain of 2-3 pounds in 1 day typically means fluid weight.) -The medications for your heart are to help your heart and help you live longer.   -Please contact us before stopping any of your heart medications.  Call the clinic at [phone] with questions or to reschedule future appointments.

## 2021-07-15 NOTE — Progress Notes (Signed)
ReDS Vest / Clip - 07/15/21 1130       ReDS Vest / Clip   Station Marker C    Ruler Value 28    ReDS Value Range Low volume    ReDS Actual Value 34

## 2021-07-16 ENCOUNTER — Encounter: Payer: Self-pay | Admitting: Student

## 2021-07-16 ENCOUNTER — Ambulatory Visit (INDEPENDENT_AMBULATORY_CARE_PROVIDER_SITE_OTHER): Payer: Self-pay | Admitting: Student

## 2021-07-16 ENCOUNTER — Encounter: Payer: Self-pay | Admitting: Hematology and Oncology

## 2021-07-16 ENCOUNTER — Other Ambulatory Visit: Payer: Self-pay

## 2021-07-16 ENCOUNTER — Ambulatory Visit (HOSPITAL_COMMUNITY)
Admission: RE | Admit: 2021-07-16 | Discharge: 2021-07-16 | Disposition: A | Discharge status: Home / Self Care | Payer: Medicaid Other | Source: Ambulatory Visit | Attending: Internal Medicine | Admitting: Internal Medicine

## 2021-07-16 ENCOUNTER — Other Ambulatory Visit (HOSPITAL_COMMUNITY): Payer: Self-pay

## 2021-07-16 VITALS — BP 123/72 | HR 70 | Temp 97.9°F | Resp 24 | Ht 68.0 in | Wt 152.4 lb

## 2021-07-16 DIAGNOSIS — M79601 Pain in right arm: Secondary | ICD-10-CM | POA: Insufficient documentation

## 2021-07-16 DIAGNOSIS — M7989 Other specified soft tissue disorders: Secondary | ICD-10-CM | POA: Diagnosis not present

## 2021-07-16 DIAGNOSIS — Z9889 Other specified postprocedural states: Secondary | ICD-10-CM | POA: Diagnosis not present

## 2021-07-16 DIAGNOSIS — M79602 Pain in left arm: Secondary | ICD-10-CM | POA: Insufficient documentation

## 2021-07-16 DIAGNOSIS — Z043 Encounter for examination and observation following other accident: Secondary | ICD-10-CM | POA: Diagnosis not present
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
## 2021-07-16 NOTE — Progress Notes (Signed)
   CC: left hand pain and swelling  HPI:  [name] is a 60 y.o. male with history listed below presenting to the [facility] for left hand pain and swelling. Please see individualized problem based charting for full HPI.  Past Medical History:  Diagnosis Date   Acute heart failure ([place]) 04/08/2021   Acute HFrEF (heart failure with reduced ejection fraction) ([place]) 04/07/2021   Arthritis    hands, elbows bilaterally   Cancer ([place])    Full dentures    Hypertension    Pt states he doen not have HTN and has never been treated for HTN   Kidney stone on left side last stone 4-5 yrs ago   recurrent (3 episodes)   Panic disorder    pt states has resolved   Syncope    resolved- was related to panic attacks    Review of Systems:  Negative aside from that listed in individualized problem based charting.  Physical Exam:  Vitals:   07/16/21 1517  BP: 123/72  Pulse: 70  Resp: (!) 24  Temp: 97.9 F (36.6 C)  TempSrc: Oral  SpO2: 99%  Weight: 152 lb 6.4 oz (69.1 kg)  Height: $Remove'5\' 8"'kgxDmTz$  (1.727 m)   Orthostatic VS for the past 24 hrs:  BP- Lying Pulse- Lying BP- Sitting Pulse- Sitting BP- Standing at 0 minutes Pulse- Standing at 0 minutes  07/16/21 1608 130/70 64 121/65 64 115/64 72    Physical Exam Constitutional:      Appearance: Normal appearance. He is not ill-appearing.  HENT:     Mouth/Throat:     Mouth: Mucous membranes are moist.     Pharynx: Oropharynx is clear. No oropharyngeal exudate.  Eyes:     Extraocular Movements: Extraocular movements intact.     Conjunctiva/sclera: Conjunctivae normal.     Pupils: Pupils are equal, round, and reactive to light.  Cardiovascular:     Rate and Rhythm: Normal rate and regular rhythm.     Pulses: Normal pulses.     Heart sounds: Normal heart sounds. No murmur heard.   No friction rub. No gallop.  Pulmonary:     Effort: Pulmonary effort is normal.     Breath sounds: Normal breath sounds. No wheezing, rhonchi or rales.  Abdominal:      General: Bowel sounds are normal. There is no distension.     Palpations: Abdomen is soft.     Tenderness: There is no abdominal tenderness.  Musculoskeletal:     Cervical back: Normal range of motion.     Comments: Significant swelling and tenderness around 1st, 2nd, and 5th metacarpals but most notably around 1st. Unable to form fist due to tenderness. Limited ROM in wrist as well.   Skin:    General: Skin is warm and dry.  Neurological:     General: No focal deficit present.     Mental Status: He is alert and oriented to person, place, and time.  Psychiatric:        Mood and Affect: Mood normal.        Behavior: Behavior normal.     Assessment & Plan:   See Encounters Tab for problem based charting.  Patient discussed with Dr. Dareen Piano

## 2021-07-16 NOTE — Patient Instructions (Signed)
[name],  It was a pleasure seeing you in the clinic today.   We will be getting an X-ray of your left hand today. I will call you with the findings. Please rest your left hand and ice it to decrease the swelling. Please come back early next week for a follow up appointment to make sure the swelling and pain have decreased.  Please call our clinic at [phone] if you have any questions or concerns. The best time to call is Monday-Friday from 9am-4pm, but there is someone available 24/7 at the same number. If you need medication refills, please notify your pharmacy one week in advance and they will send [place] a request.   Thank you for letting us take part in your care. We look forward to seeing you next time!

## 2021-07-17 ENCOUNTER — Inpatient Hospital Stay (HOSPITAL_BASED_OUTPATIENT_CLINIC_OR_DEPARTMENT_OTHER): Payer: Medicaid Other | Admitting: Hematology and Oncology

## 2021-07-17 ENCOUNTER — Inpatient Hospital Stay: Payer: Medicaid Other | Attending: Hematology and Oncology

## 2021-07-17 ENCOUNTER — Telehealth (HOSPITAL_COMMUNITY): Payer: Self-pay | Admitting: Licensed Clinical Social Worker

## 2021-07-17 ENCOUNTER — Inpatient Hospital Stay: Payer: Medicaid Other | Admitting: General Practice

## 2021-07-17 VITALS — BP 117/73 | HR 69 | Temp 97.9°F | Resp 16 | Ht 68.0 in | Wt 152.9 lb

## 2021-07-17 DIAGNOSIS — R059 Cough, unspecified: Secondary | ICD-10-CM | POA: Diagnosis not present

## 2021-07-17 DIAGNOSIS — R590 Localized enlarged lymph nodes: Secondary | ICD-10-CM | POA: Insufficient documentation

## 2021-07-17 DIAGNOSIS — I110 Hypertensive heart disease with heart failure: Secondary | ICD-10-CM | POA: Diagnosis not present

## 2021-07-17 DIAGNOSIS — Z885 Allergy status to narcotic agent status: Secondary | ICD-10-CM | POA: Insufficient documentation

## 2021-07-17 DIAGNOSIS — I5022 Chronic systolic (congestive) heart failure: Secondary | ICD-10-CM | POA: Insufficient documentation

## 2021-07-17 DIAGNOSIS — Z596 Low income: Secondary | ICD-10-CM | POA: Diagnosis not present

## 2021-07-17 DIAGNOSIS — D472 Monoclonal gammopathy: Secondary | ICD-10-CM | POA: Diagnosis not present

## 2021-07-17 DIAGNOSIS — R42 Dizziness and giddiness: Secondary | ICD-10-CM | POA: Diagnosis not present

## 2021-07-17 DIAGNOSIS — R0602 Shortness of breath: Secondary | ICD-10-CM | POA: Diagnosis not present

## 2021-07-17 DIAGNOSIS — R002 Palpitations: Secondary | ICD-10-CM | POA: Diagnosis not present

## 2021-07-17 DIAGNOSIS — C880 Waldenstrom macroglobulinemia: Secondary | ICD-10-CM | POA: Diagnosis not present

## 2021-07-17 DIAGNOSIS — Z8249 Family history of ischemic heart disease and other diseases of the circulatory system: Secondary | ICD-10-CM | POA: Diagnosis not present

## 2021-07-17 DIAGNOSIS — D649 Anemia, unspecified: Secondary | ICD-10-CM | POA: Insufficient documentation

## 2021-07-17 DIAGNOSIS — R110 Nausea: Secondary | ICD-10-CM | POA: Insufficient documentation

## 2021-07-17 DIAGNOSIS — I700 Atherosclerosis of aorta: Secondary | ICD-10-CM | POA: Diagnosis not present

## 2021-07-17 DIAGNOSIS — Z5941 Food insecurity: Secondary | ICD-10-CM | POA: Insufficient documentation

## 2021-07-17 LAB — CBC WITH DIFFERENTIAL (CANCER CENTER ONLY)
Abs Immature Granulocytes: 0.01 10*3/uL (ref 0.00–0.07)
Basophils Absolute: 0.1 10*3/uL (ref 0.0–0.1)
Basophils Relative: 1 %
Eosinophils Absolute: 0.5 10*3/uL (ref 0.0–0.5)
Eosinophils Relative: 7 %
HCT: 33.4 % — ABNORMAL LOW (ref 39.0–52.0)
Hemoglobin: 10.5 g/dL — ABNORMAL LOW (ref 13.0–17.0)
Immature Granulocytes: 0 %
Lymphocytes Relative: 23 %
Lymphs Abs: 1.5 10*3/uL (ref 0.7–4.0)
MCH: 25 pg — ABNORMAL LOW (ref 26.0–34.0)
MCHC: 31.4 g/dL (ref 30.0–36.0)
MCV: 79.5 fL — ABNORMAL LOW (ref 80.0–100.0)
Monocytes Absolute: 0.6 10*3/uL (ref 0.1–1.0)
Monocytes Relative: 10 %
Neutro Abs: 3.7 10*3/uL (ref 1.7–7.7)
Neutrophils Relative %: 59 %
Platelet Count: 315 10*3/uL (ref 150–400)
RBC: 4.2 MIL/uL — ABNORMAL LOW (ref 4.22–5.81)
RDW: 17.7 % — ABNORMAL HIGH (ref 11.5–15.5)
WBC Count: 6.3 10*3/uL (ref 4.0–10.5)
nRBC: 0 % (ref 0.0–0.2)

## 2021-07-17 LAB — CMP (CANCER CENTER ONLY)
ALT: 6 U/L (ref 0–44)
AST: 10 U/L — ABNORMAL LOW (ref 15–41)
Albumin: 3.2 g/dL — ABNORMAL LOW (ref 3.5–5.0)
Alkaline Phosphatase: 86 U/L (ref 38–126)
Anion gap: 8 (ref 5–15)
BUN: 13 mg/dL (ref 6–20)
CO2: 26 mmol/L (ref 22–32)
Calcium: 8.9 mg/dL (ref 8.9–10.3)
Chloride: 101 mmol/L (ref 98–111)
Creatinine: 0.84 mg/dL (ref 0.61–1.24)
GFR, Estimated: >60 mL/min (ref >60)
Glucose, Bld: 97 mg/dL (ref 70–99)
Potassium: 3.8 mmol/L (ref 3.5–5.1)
Sodium: 135 mmol/L (ref 135–145)
Total Bilirubin: 0.4 mg/dL (ref 0.3–1.2)
Total Protein: 5.9 g/dL — ABNORMAL LOW (ref 6.5–8.1)

## 2021-07-17 LAB — LACTATE DEHYDROGENASE: LDH: 98 U/L (ref 98–192)

## 2021-07-17 NOTE — Progress Notes (Signed)
[place] [phone]   [phone]  PROGRESS NOTE  Patient Care Team: Delene Ruffini, MD as PCP - General (Internal Medicine)  Hematological/Oncological History # Waldenstrom's Macroglobulinemia # IgM Monoclonal Gammopathy 04/07/2021: CT A/p showed mildly enlarged retroperitoneal lymph nodes and bilateral inguinal lymph nodes.  04/08/2021: IgM Kappa M protein 1.2 04/09/2021: WBC 11.1, Hgb 9.5, MCV 75.4, Plt 525 04/20/2021: Kappa 977, Lambda 7.8, K/L ratio 125.28. Serum viscosity 1.8 05/06/2021: Establish care with Dr. 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 Infusion reaction with ritux, held halfway through.  06/12/2021: Cycle 2 Day 1 of Ibrutinib + Rituximab 06/19/2021: Cycle 3 Day 1 of Ibrutinib + Rituximab 06/26/2021: completed 4 weeks of rituximab. Continued on daily ibrutinib   Interval History:  Connor Solomon 60 y.o. male with medical history significant for Waldenstrom's macroglobulinemia who presents for a follow up visit. The patient's last visit was on 07/03/2021. In the interim since the last visit he has continued on ibrutinib therapy.  On exam today Mr. Rybacki notes his energy levels remain poor.  He reports that he did have a fall from standing on Sunday night after he got dizzy and his knee gave out.  He did fall on his left hand which was subsequently x-rayed and found to have no fracture.  He has been having palpitations in his heart for which he is currently wearing a heart monitor.  He notes that the pills are causing some mild nausea but no vomiting or diarrhea.  He reports he is not having any issues with shortness of breath, headache, vision changes, or chest pain.  Overall he is tolerating the medication well no other questions concerns or  complaints.  Full 10 point ROS is listed below.  MEDICAL HISTORY:  Past Medical History:  Diagnosis Date   Acute heart failure ([place]) 04/08/2021   Acute HFrEF (heart failure with reduced ejection fraction) ([place]) 04/07/2021   Arthritis    hands, elbows bilaterally   Cancer ([facility])    Full dentures    Hypertension    Pt states he doen not have HTN and has never been treated for HTN   Kidney stone on left side last stone 4-5 yrs ago   recurrent (3 episodes)   Panic disorder    pt states has resolved   Syncope    resolved- was related to panic attacks    SURGICAL HISTORY: Past Surgical History:  Procedure Laterality Date   CARPAL METACARPAL FUSION WITH DISTAL RADIAL BONE GRAFT Left 05/30/2013   Procedure: LEFT THUMB METACARPAL JOINT FUSION;  Surgeon: Schuyler Amor, MD;  Location: [place];  Service: Orthopedics;  Laterality: Left;   ESOPHAGOGASTRODUODENOSCOPY N/A 02/12/2014   Procedure: ESOPHAGOGASTRODUODENOSCOPY (EGD);  Surgeon: Jerene Bears, MD;  Location: [facility] ENDOSCOPY;  Service: Endoscopy;  Laterality: N/A;   FOREIGN BODY REMOVAL N/A 02/12/2014   Procedure: FOREIGN BODY REMOVAL;  Surgeon: Jerene Bears, MD;  Location: [place];  Service: Endoscopy;  Laterality: N/A;   OPEN REDUCTION INTERNAL FIXATION (ORIF) DISTAL RADIAL FRACTURE Left 11/29/2012   Procedure: OPEN REDUCTION INTERNAL FIXATION (ORIF) DISTAL RADIAL FRACTURE;  Surgeon: Schuyler Amor, MD;  Location: [place];  Service: Orthopedics;  Laterality: Left;  LEFT DISTAL RADIUS OSTEOTOMY WITH BONE GRAFT   RIGHT/LEFT HEART CATH AND CORONARY ANGIOGRAPHY N/A 04/09/2021   Procedure: RIGHT/LEFT HEART CATH  AND CORONARY ANGIOGRAPHY;  Surgeon: Jolaine Artist, MD;  Location: [place] [facility];  Service: Cardiovascular;  Laterality: N/A;   TENDON REPAIR Left 02/07/2013   Procedure: LEFT EXTENSOR INDICIS PROPRIUS  TO EXTENSOR POLLICIS LONGUS TENDON TRANSFER;  Surgeon: Schuyler Amor, MD;   Location: [place];  Service: Orthopedics;  Laterality: Left;   WISDOM TOOTH EXTRACTION     WRIST SURGERY     fx only    SOCIAL HISTORY: Social History   Socioeconomic History   Marital status: Single    Spouse name: Not on file   Number of children: Not on file   Years of education: Not on file   Highest education level: Not on file  Occupational History   Not on file  Tobacco Use   Smoking status: Former    Types: Cigars    Quit date: 10/11/2020    Years since quitting: 0.7   Smokeless tobacco: Never   Tobacco comments:    smokes 1 cigar a week  Substance and Sexual Activity   Alcohol use: No   Drug use: No   Sexual activity: Not on file  Other Topics Concern   Not on file  Social History Narrative   Not on file   Social Determinants of Health   Financial Resource Strain: High Risk   Difficulty of Paying Living Expenses: Hard  Food Insecurity: Food Insecurity Present   Worried About Running Out of Food in the Last Year: Sometimes true   Ran Out of Food in the Last Year: Sometimes true  Transportation Needs: No Transportation Needs   Lack of Transportation (Medical): No   Lack of Transportation (Non-Medical): No  Physical Activity: Not on file  Stress: Not on file  Social Connections: Not on file  Intimate Partner Violence: Not on file    FAMILY HISTORY: Family History  Problem Relation Age of Onset   Heart attack Father    Sudden Cardiac Death Father 67   Diabetes Neg Hx    Hyperlipidemia Neg Hx    Hypertension Neg Hx     ALLERGIES:  is allergic to rituxan [rituximab] and hydrocodone.  MEDICATIONS:  Current Outpatient Medications  Medication Sig Dispense Refill   acetaminophen (TYLENOL) 500 MG tablet Take 500 mg by mouth every 6 (six) hours as needed.     albuterol (PROAIR HFA) 108 (90 Base) MCG/ACT inhaler Inhale 2 puffs into the lungs every 6 (six) hours as needed for wheezing or shortness of breath. 8.5 g 2   allopurinol  (ZYLOPRIM) 300 MG tablet Take 1 tablet (300 mg total) by mouth daily. 90 tablet 1   carvedilol (COREG) 3.125 MG tablet Take 1 tablet (3.125 mg total) by mouth 2 (two) times daily. 68 tablet 2   empagliflozin (JARDIANCE) 10 MG TABS tablet Take 1 tablet (10 mg total) by mouth daily before breakfast. 30 tablet 11   ferrous sulfate 325 (65 FE) MG tablet Take 1 tablet (325 mg total) by mouth daily with breakfast. (Patient not taking: Reported on 07/15/2021) 30 tablet 7   furosemide (LASIX) 20 MG tablet Take 1 tablet (20 mg total) by mouth daily as needed for swelling 100 tablet 2   ibrutinib 420 MG TABS Take 420 mg by mouth daily. 30 tablet 2   ondansetron (ZOFRAN) 8 MG tablet Take 1 tablet (8 mg total) by mouth every 8 (eight) hours as needed. 30 tablet 0   oxyCODONE-acetaminophen (PERCOCET) 5-325 MG tablet Take 1 tablet by mouth every 6 (  six) hours as needed for severe pain. 60 tablet 0   sacubitril-valsartan (ENTRESTO) 24-26 MG Take 1 tablet by mouth 2 (two) times daily. 60 tablet 5   No current facility-administered medications for this visit.    REVIEW OF SYSTEMS:   Constitutional: ( - ) fevers, ( - )  chills , ( - ) night sweats Eyes: ( - ) blurriness of vision, ( - ) double vision, ( - ) watery eyes Ears, nose, mouth, throat, and face: ( - ) mucositis, ( - ) sore throat Respiratory: ( - ) cough, ( - ) dyspnea, ( - ) wheezes Cardiovascular: ( - ) palpitation, ( - ) chest discomfort, ( - ) lower extremity swelling Gastrointestinal:  ( - ) nausea, ( - ) heartburn, ( - ) change in bowel habits Skin: ( - ) abnormal skin rashes Lymphatics: ( - ) new lymphadenopathy, ( - ) easy bruising Neurological: ( - ) numbness, ( - ) tingling, ( - ) new weaknesses Behavioral/Psych: ( - ) mood change, ( - ) new changes  All other systems were reviewed with the patient and are negative.  PHYSICAL EXAMINATION: ECOG PERFORMANCE STATUS: 1 - Symptomatic but completely ambulatory  Vitals:   07/17/21 1019  BP:  117/73  Pulse: 69  Resp: 16  Temp: 97.9 F (36.6 C)  SpO2: 100%   Filed Weights   07/17/21 1019  Weight: 152 lb 14.4 oz (69.4 kg)    GENERAL: Well-appearing middle-aged Caucasian male, alert, no distress and comfortable SKIN: skin color, texture, turgor are normal, no rashes or significant lesions EYES: conjunctiva are pink and non-injected, sclera clear LUNGS: clear to auscultation and percussion with normal breathing effort HEART: regular rate & rhythm and no murmurs and no lower extremity edema  PSYCH: alert & oriented x 3, fluent speech NEURO: no focal motor/sensory deficits  LABORATORY DATA:  I have reviewed the data as listed CBC Latest Ref Rng & Units 07/17/2021 07/03/2021 06/26/2021  WBC 4.0 - 10.5 K/uL 6.3 9.0 6.0  Hemoglobin 13.0 - 17.0 g/dL 10.5(L) 10.5(L) 11.2(L)  Hematocrit 39.0 - 52.0 % 33.4(L) 32.6(L) 34.7(L)  Platelets 150 - 400 K/uL 315 202 248    CMP Latest Ref Rng & Units 07/17/2021 07/03/2021 06/29/2021  Glucose 70 - 99 mg/dL 97 105(H) 120(H)  BUN 6 - 20 mg/dL $Remove'13 10 7  'JVEbfoj$ Creatinine 0.61 - 1.24 mg/dL 0.84 0.76 0.84  Sodium 135 - 145 mmol/L 135 132(L) 134(L)  Potassium 3.5 - 5.1 mmol/L 3.8 3.5 3.5  Chloride 98 - 111 mmol/L 101 99 103  CO2 22 - 32 mmol/L $RemoveB'26 24 22  'uRMWTtcJ$ Calcium 8.9 - 10.3 mg/dL 8.9 8.6(L) 8.5(L)  Total Protein 6.5 - 8.1 g/dL 5.9(L) 5.9(L) -  Total Bilirubin 0.3 - 1.2 mg/dL 0.4 0.7 -  Alkaline Phos 38 - 126 U/L 86 71 -  AST 15 - 41 U/L 10(L) 8(L) -  ALT 0 - 44 U/L 6 6 -    Lab Results  Component Value Date   MPROTEIN 1.2 (H) 04/08/2021   Lab Results  Component Value Date   KAPLAMBRATIO 136.50 (H) 07/09/2021   KAPLAMBRATIO 875.18 (H) 05/12/2021    RADIOGRAPHIC STUDIES: DG Chest 2 View  Result Date: 07/03/2021 CLINICAL DATA:  60 year old male with history of cough and shortness of breath. Evaluate for pneumonia. EXAM: CHEST - 2 VIEW COMPARISON:  Chest x-ray 04/07/2021. FINDINGS: Lung volumes are normal. No consolidative airspace disease. No  pleural effusions. No pneumothorax. No pulmonary nodule or mass noted.  Pulmonary vasculature and the cardiomediastinal silhouette are within normal limits. Atherosclerotic calcifications in the thoracic aorta. IMPRESSION: 1.  No radiographic evidence of acute cardiopulmonary disease. 2. Aortic atherosclerosis. Electronically Signed   By: Vinnie Langton M.D.   On: 07/03/2021 16:21   DG Hand Complete Left  Result Date: 07/16/2021 CLINICAL DATA:  Fall on outstretched hand EXAM: LEFT HAND - COMPLETE 3+ VIEW COMPARISON:  03/17/2021, 05/17/2015 FINDINGS: Postsurgical changes at the first MCP joint and distal radius. No acute displaced fracture or malalignment. Chronic deformity at the fourth distal phalanx. Old avulsion or os adjacent to the ulnar styloid. IMPRESSION: No acute osseous abnormality Electronically Signed   By: Donavan Foil M.D.   On: 07/16/2021 17:42    ASSESSMENT & PLAN Connor Solomon 60 y.o. male with medical history significant for Waldenstrom's macroglobulinemia who presents for a follow up visit.   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  There is no clear evidence of hyperviscosity syndrome at this time.  At this time we will plan to proceed with ibrutinib and rituximab therapy.  One could also consider Bendamustine and rituximab therapy but given the patient's degree of anemia I would prefer pursuing ibrutinib and rituximab at this time.  Additionally ibrutinib/rituximab are category 1 recommendations per the NCCN guidelines.  The treatment will consist of ibrutinib 420 mg p.o. daily with rituximab on weeks 1 through 4 as well as weeks 27 through 30.  Previously we discussed the risks and benefits of therapy including (but not limited to) risk of bleeding, fatigue, atypical infections, and cardiac arrhythmias.  The patient voices understanding of  this plan moving forward.  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 --will order monthly SPEP, SFLC, and UPEP --RTC in 4 weeks to assure he is improving  # Shortness of Breath/Cough #Concern for Pneumonia --improved from prior --continue to monitor.   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    -- At this time the patient's hemoglobin is most consistent with anemia due to lymphomatous involvement of bone marrow --There does appear to be a component of iron deficiency anemia as well.  Recommend the patient continue p.o. iron therapy --Continue to monitor.  #Supportive Care -- chemotherapy education complete -- port placement not required -- allopurinol 300mg  PO daily for TLS prophylaxis   No orders of the defined types were placed in this encounter.   All questions were answered. The patient knows to call the clinic with any problems, questions or concerns.  A total of more than 30 minutes were spent on this encounter with face-to-face time and non-face-to-face time, including preparing to see the patient, ordering tests and/or medications, counseling the patient and coordination of care as outlined above.   Ledell Peoples, MD Department of Hematology/Oncology [place] at [facility] Phone: [phone] Pager: [phone] Email: [email or web address]  07/17/2021 1:17 PM  Dimopoulos MA, Rennie Natter, Trotman J, Basilia Jumbo, Leblond V, Mahe B, Herbaux C, Tam C, Orsucci L, [place] ML, Matous JV, [place] C, Kastritis E, [place], Li J, Salman Z, Graef T, Buske C; iNNOVATE Study Group and the [employer] for  Waldenstrm's Macroglobulinemia. Phase 3 Trial of Ibrutinib plus Rituximab in Waldenstrm's Macroglobulinemia. Alison Stalling J Med. 2018 Jun 21;378(25):2399-2410.   --At 30 months, the progression-free survival rate was 82% with ibrutinib-rituximab versus 28% with placebo-rituximab (hazard ratio for progression or death, 0.20; P<0.001).

## 2021-07-17 NOTE — Telephone Encounter (Signed)
Advanced Heart Failure Patient Advocate Encounter  Sent in [employer] application via fax.  Will follow up.

## 2021-07-17 NOTE — Progress Notes (Signed)
Heart and Vascular Care Navigation  07/17/2021  Connor Solomon January 10, 1961 [id number]  Reason for Referral: Financial Resources   Engaged with patient by telephone for follow up visit for Heart and Vascular Care Coordination.                                                                                                   Assessment:   CSW consulted by pt cancer center CSW that pt in need of financial assistance.  Spoke with pt who is still awaiting disability though states everything is submitted and now they are just waiting.  He reports he currently owes $389 for his mortgage and is behind about $200 for his water bill- shut off notice for later this month he states.  Pt has already utilized over $1400 from patient care fund so is not eligible for further assistance through fund at this time.                                   HRT/VAS Care Coordination     Patients Home Cardiology Office Heart Failure Clinic   Outpatient Care Team Social Worker   Social Worker Name: Raquel Sarna, [place] [phone]   Living arrangements for the past 2 months Single Family Home   Lives with: Self; Significant Other   Patient Has Concern With Paying Medical Bills Yes   Medical Bill Referrals: Financial Couseling/[employer]   Does Patient Have Prescription Coverage? No   Patient Prescription Assistance Programs Heart Failure Fund   Home Assistive Devices/Equipment None       Social History:                                                                             SDOH Screenings   Alcohol Screen: Not on file  Depression (PHQ2-9): Low Risk    PHQ-2 Score: 0  Financial Resource Strain: High Risk   Difficulty of Paying Living Expenses: Very hard  Food Insecurity: Food Insecurity Present   Worried About Charity fundraiser in the Last Year: Sometimes true   Arboriculturist in the Last Year: Sometimes true  Housing: High Risk   Last Housing Risk Score: 2  Physical Activity: Not on file   Social Connections: Not on file  Stress: Not on file  Tobacco Use: Medium Risk   Smoking Tobacco Use: Former   Smokeless Tobacco Use: Never  Transportation Needs: No Transportation Needs   Lack of Transportation (Medical): No   Lack of Transportation (Non-Medical): No    SDOH Interventions: Financial Resources:  Financial Strain Interventions: Other (Comment) (resources) Ship broker Insecurity:   Not assessed  Housing Insecurity:  Housing Interventions: Other (Comment) (sent resource list)  Transportation:  Not assessed    Follow-up plan:    CSW mailed assistance options to pt- including low income water assistance application for [employer]  Pt to follow up with community resources to find assistance with outstanding bills.  Will continue to follow and assist as needed  Jorge Ny, [facility] Desk#: [phone] Cell#: [phone]

## 2021-07-20 ENCOUNTER — Encounter: Payer: Self-pay | Admitting: Hematology and Oncology

## 2021-07-20 ENCOUNTER — Other Ambulatory Visit: Payer: Self-pay | Admitting: Hematology and Oncology

## 2021-07-20 ENCOUNTER — Other Ambulatory Visit (HOSPITAL_COMMUNITY): Payer: Self-pay

## 2021-07-20 ENCOUNTER — Encounter: Payer: Self-pay | Admitting: Student

## 2021-07-20 DIAGNOSIS — N62 Hypertrophy of breast: Secondary | ICD-10-CM

## 2021-07-20 MED ORDER — ONDANSETRON HCL 8 MG PO TABS
8.0000 mg | ORAL_TABLET | Freq: Three times a day (TID) | ORAL | 0 refills | Status: DC | PRN
  Filled 2021-07-20: qty 30, 10d supply, fill #0

## 2021-07-20 MED ORDER — OXYCODONE-ACETAMINOPHEN 5-325 MG PO TABS
1.0000 | ORAL_TABLET | Freq: Four times a day (QID) | ORAL | 0 refills | Status: DC | PRN
  Filled 2021-07-20 – 2021-07-25 (×4): qty 60, 15d supply, fill #0

## 2021-07-20 NOTE — Progress Notes (Signed)
Internal Medicine Clinic Attending  Case discussed with Dr. Jinwala  At the time of the visit.  We reviewed the resident's history and exam and pertinent patient test results.  I agree with the assessment, diagnosis, and plan of care documented in the resident's note.  

## 2021-07-21 ENCOUNTER — Encounter: Payer: Self-pay | Admitting: Student

## 2021-07-22 ENCOUNTER — Other Ambulatory Visit (HOSPITAL_COMMUNITY): Payer: Self-pay

## 2021-07-22 ENCOUNTER — Encounter: Payer: Self-pay | Admitting: Hematology and Oncology

## 2021-07-23 ENCOUNTER — Encounter: Payer: Self-pay | Admitting: Hematology and Oncology

## 2021-07-23 ENCOUNTER — Other Ambulatory Visit (HOSPITAL_COMMUNITY): Payer: Self-pay

## 2021-07-24 ENCOUNTER — Encounter: Payer: Self-pay | Admitting: Hematology and Oncology

## 2021-07-24 ENCOUNTER — Other Ambulatory Visit (HOSPITAL_COMMUNITY): Payer: Self-pay

## 2021-07-25 ENCOUNTER — Other Ambulatory Visit (HOSPITAL_COMMUNITY): Payer: Self-pay

## 2021-07-25 ENCOUNTER — Encounter: Payer: Self-pay | Admitting: Hematology and Oncology

## 2021-07-27 ENCOUNTER — Ambulatory Visit (INDEPENDENT_AMBULATORY_CARE_PROVIDER_SITE_OTHER): Payer: Self-pay | Admitting: Primary Care

## 2021-07-27 ENCOUNTER — Encounter (HOSPITAL_COMMUNITY): Payer: Self-pay | Admitting: Pharmacy Technician

## 2021-07-27 NOTE — Telephone Encounter (Signed)
Advanced Heart Failure Patient Advocate Encounter   Patient was approved to receive Entresto from [employer]  Patient ID: [id number] Effective dates: 07/21/21 through 07/21/22  Sent approval through [facility].  Charlann Boxer, CPhT

## 2021-07-31 ENCOUNTER — Encounter: Payer: Self-pay | Admitting: Hematology and Oncology

## 2021-08-03 NOTE — Progress Notes (Signed)
Advanced Heart Failure Team Clinic Note PCP: Juluis Mire HF Cardiologist: Dr. Haroldine Laws  HPI: Connor Solomon is a 60 y.o. WM w/ h/o HTN, new IDA and new systolic HF.   Pt had stress echo in 2010 for CP showing no stress arrhythmias or conduction abnormalities. The stress ECG was negative for ischemia. There was no echocardiographic evidence for stress-induced ischemia. EF normal. In 2011, he had syncope and evaluated by Dr. Harrington Challenger but this was felt to be vasovagal syncope. No further cardiac w/u.    Tested + for COVID 5/21. Did not require hospitalization but hasn't felt well since. Not vaccinated.   For the last 6 months he has noted marked fatigue + 20 lb unintentional wt loss and some occasional night sweats.   He presented to ED 04/07/21 where he was found to be in acute CHF. CXR suspicious for bronchopneumonia. Also could not r/o underlying malignancy. CT + for moderate bilateral pleural effusions and patchy airspace opacities consistent with multifocal pneumonia. He was found to be anemic w/ hgb 8.6.   Admitted by IM and started on IV Lasix. Echo showed severely reduced LVEF, 20-25% w/ glogal HK, Grade III DD (restrictive), RV normal. Mod-severe MR. No LVH. 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 ESR 142, autoimmune workup ensued. cMRI consistent with NICM, EF 25%.  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 Discharged on spiro, carvedilol and losartan (bp too soft for Entresto).  Seen by Dr. Lorenso Courier with Heme/Onc. 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 Started Ibrutinib + Rituximab 06/05/21.   Today he returns for HF follow up. He remains fatigued but breathing is improved some. He can walk longer distances on flat ground before getting SOB. Having nausea with his treatments. He has had wrist, knee, and ankle swelling but  none currently, took lasix a few times a couple weeks ago for this. He thinks Vania Rea is causing the swelling. Palpitations are better and he mailed his Zio back last week. Denies CP, dizziness, edema, or PND/Orthopnea. Appetite poor, having to use Zofran. No fever or chills. He does not weigh at home. Taking all medications. He has Medicaid now and awaiting disability.   Cardiac Studies:   - R/LHC (6/22): Normal cors, severe MICM EF 25%, low filling pressures with normal CO.   Ao =98/68 (82) LV = 81/3 RA = 1 RV = 27/2 PA = 25/8 (16) PCW = 10 Fick cardiac output/index = 5.8/3.2 PVR = 1.0 WU Ao sat = 100% PA sat = 66%, 70%   - Echo (6/22): EF 20-25%, Grade III DD, RV ok, mod/severe MR  ROS: All systems negative except as listed in HPI, PMH and Problem List.  SH:  Social History   Socioeconomic History   Marital status: Single    Spouse name: Not on file   Number of children: Not on file   Years of education: Not on file   Highest education level: Not on file  Occupational History   Not on file  Tobacco Use   Smoking status: Former    Types: Cigars    Quit date: 10/11/2020    Years since quitting: 0.8   Smokeless tobacco: Never   Tobacco comments:    smokes 1 cigar a week  Substance and Sexual Activity   Alcohol use: No   Drug use: No   Sexual activity: Not on file  Other Topics Concern  Not on file  Social History Narrative   Not on file   Social Determinants of Health   Financial Resource Strain: High Risk   Difficulty of Paying Living Expenses: Very hard  Food Insecurity: Food Insecurity Present   Worried About Charity fundraiser in the Last Year: Sometimes true   Ran Out of Food in the Last Year: Sometimes true  Transportation Needs: No Transportation Needs   Lack of Transportation (Medical): No   Lack of Transportation (Non-Medical): No  Physical Activity: Not on file  Stress: Not on file  Social Connections: Not on file  Intimate Partner Violence:  Not on file   FH:  Family History  Problem Relation Age of Onset   Heart attack Father    Sudden Cardiac Death Father 48   Diabetes Neg Hx    Hyperlipidemia Neg Hx    Hypertension Neg Hx    Past Medical History:  Diagnosis Date   Acute heart failure ([place]) 04/08/2021   Acute HFrEF (heart failure with reduced ejection fraction) ([place]) 04/07/2021   Arthritis    hands, elbows bilaterally   Cancer ([place])    Full dentures    Hypertension    Pt states he doen not have HTN and has never been treated for HTN   Kidney stone on left side last stone 4-5 yrs ago   recurrent (3 episodes)   Panic disorder    pt states has resolved   Syncope    resolved- was related to panic attacks   Current Outpatient Medications  Medication Sig Dispense Refill   acetaminophen (TYLENOL) 500 MG tablet Take 500 mg by mouth every 6 (six) hours as needed.     albuterol (PROAIR HFA) 108 (90 Base) MCG/ACT inhaler Inhale 2 puffs into the lungs every 6 (six) hours as needed for wheezing or shortness of breath. 8.5 g 2   allopurinol (ZYLOPRIM) 300 MG tablet Take 1 tablet (300 mg total) by mouth daily. 90 tablet 1   carvedilol (COREG) 3.125 MG tablet Take 1 tablet (3.125 mg total) by mouth 2 (two) times daily. 68 tablet 2   empagliflozin (JARDIANCE) 10 MG TABS tablet Take 1 tablet (10 mg total) by mouth daily before breakfast. 30 tablet 11   eplerenone (INSPRA) 25 MG tablet Take 0.5 tablets (12.5 mg total) by mouth daily. 15 tablet 11   furosemide (LASIX) 20 MG tablet Take 1 tablet (20 mg total) by mouth daily as needed for swelling 100 tablet 2   ibrutinib 420 MG TABS Take 420 mg by mouth daily. 30 tablet 2   ondansetron (ZOFRAN) 8 MG tablet Take 1 tablet (8 mg total) by mouth every 8 (eight) hours as needed. 30 tablet 0   oxyCODONE-acetaminophen (PERCOCET) 5-325 MG tablet Take 1 tablet by mouth every 6 (six) hours as needed for severe pain. 60 tablet 0   sacubitril-valsartan (ENTRESTO) 24-26 MG Take 1 tablet by mouth  2 (two) times daily. 60 tablet 5   No current facility-administered medications for this encounter.   BP 120/66   Pulse 83   Wt 67.9 kg (149 lb 9.6 oz)   SpO2 99%   BMI 22.75 kg/m   Wt Readings from Last 3 Encounters:  08/04/21 67.9 kg (149 lb 9.6 oz)  07/17/21 69.4 kg (152 lb 14.4 oz)  07/16/21 69.1 kg (152 lb 6.4 oz)   PHYSICAL EXAM: General:  NAD. No resp difficulty HEENT: Normal Neck: Supple. No JVD. Carotids 2+ bilat; no bruits. No lymphadenopathy or thryomegaly  appreciated. Cor: PMI nondisplaced. Regular rate & rhythm. No rubs, gallops or murmurs. Lungs: Clear Abdomen: Soft, nontender, nondistended. No hepatosplenomegaly. No bruits or masses. Good bowel sounds. Extremities: No cyanosis, clubbing, rash, edema Neuro: Alert & oriented x 3, cranial nerves grossly intact. Moves all 4 extremities w/o difficulty. Affect pleasant.  ASSESSMENT & PLAN:  1. Chronic Systolic Heart Failure - Stress Echo 2010: normal EF. No evidence of ischemia - Echo 6/22: EF 20-25%, GIIDD (restrictive), no LVH. RV normal - ? Viral CM (COVID + 2021), ? Familia (FH of SCD). TSH normal, HIV pending - LHC/RHC (6/22): with normal cors, low filling pressures, and normal cardiac output - Also ? Infiltrative CM. cMRI EF 25%, normal RV, Basal septal midwall LGE, mild MR.  - MM panel urine immunofixation: IgM monoclonal protein with kappa light chain  specificity. - Stable NYHA III, functional status difficult due to physical deconditioning and lymphoma treatments. Volume status ok today. - Start eplerenone 12.5 mg daily (gynecomastia with spiro).  - Continue Entresto 24/26 mg bid.  - Continue carvedilol 3.125 mg bid. - Continue Jardiance 10 mg daily.  - OK to use lasix prn swelling/weight gain. - BMET today; repeat in 1 and 6 weeks. - Zio placed last visit for palpitations. Results pending.    2.  Mitral Regurgitation - Mod-severe, likely functional from dilated LV.   3.  Iron Deficiency Anemia - Per  PCP. - Recent Hgb 10.5.       4.  Waldenstrom's macroglobulinemia. - Now followed by Dr. Lorenso Courier with Heme/Onc - Completed 4 weeks of Rituximab. Now continued on daily ibrutinib.  5. SDOH - HFSW assisting with hospital bills and insurance. - He now has Medicaid and disability is pending.  Follow up with Dr. Haroldine Laws + echo as scheduled in December.  Allena Katz, FNP-BC 08/04/21

## 2021-08-04 ENCOUNTER — Encounter: Payer: Self-pay | Admitting: Hematology and Oncology

## 2021-08-04 ENCOUNTER — Ambulatory Visit (HOSPITAL_COMMUNITY)
Admission: RE | Admit: 2021-08-04 | Discharge: 2021-08-04 | Disposition: A | Discharge status: Home / Self Care | Payer: Medicaid Other | Source: Ambulatory Visit | Attending: Cardiology | Admitting: Cardiology

## 2021-08-04 ENCOUNTER — Other Ambulatory Visit: Payer: Self-pay

## 2021-08-04 ENCOUNTER — Other Ambulatory Visit: Payer: Self-pay | Admitting: Hematology and Oncology

## 2021-08-04 ENCOUNTER — Other Ambulatory Visit (HOSPITAL_COMMUNITY): Payer: Self-pay

## 2021-08-04 ENCOUNTER — Encounter (HOSPITAL_COMMUNITY): Payer: Self-pay

## 2021-08-04 VITALS — BP 120/66 | HR 83 | Wt 149.6 lb

## 2021-08-04 DIAGNOSIS — I110 Hypertensive heart disease with heart failure: Secondary | ICD-10-CM | POA: Insufficient documentation

## 2021-08-04 DIAGNOSIS — Z79899 Other long term (current) drug therapy: Secondary | ICD-10-CM | POA: Diagnosis not present

## 2021-08-04 DIAGNOSIS — Z733 Stress, not elsewhere classified: Secondary | ICD-10-CM | POA: Diagnosis not present

## 2021-08-04 DIAGNOSIS — C880 Waldenstrom macroglobulinemia: Secondary | ICD-10-CM | POA: Diagnosis not present

## 2021-08-04 DIAGNOSIS — I5022 Chronic systolic (congestive) heart failure: Secondary | ICD-10-CM | POA: Diagnosis not present

## 2021-08-04 DIAGNOSIS — I340 Nonrheumatic mitral (valve) insufficiency: Secondary | ICD-10-CM | POA: Diagnosis not present

## 2021-08-04 DIAGNOSIS — Z87891 Personal history of nicotine dependence: Secondary | ICD-10-CM | POA: Diagnosis not present

## 2021-08-04 DIAGNOSIS — Z8616 Personal history of COVID-19: Secondary | ICD-10-CM | POA: Insufficient documentation

## 2021-08-04 DIAGNOSIS — Z7984 Long term (current) use of oral hypoglycemic drugs: Secondary | ICD-10-CM | POA: Diagnosis not present

## 2021-08-04 DIAGNOSIS — Z597 Insufficient social insurance and welfare support: Secondary | ICD-10-CM | POA: Diagnosis not present

## 2021-08-04 DIAGNOSIS — Z28310 Unvaccinated for covid-19: Secondary | ICD-10-CM | POA: Insufficient documentation

## 2021-08-04 DIAGNOSIS — D509 Iron deficiency anemia, unspecified: Secondary | ICD-10-CM

## 2021-08-04 DIAGNOSIS — N62 Hypertrophy of breast: Secondary | ICD-10-CM

## 2021-08-04 LAB — BASIC METABOLIC PANEL
Anion gap: 9 (ref 5–15)
BUN: 10 mg/dL (ref 6–20)
CO2: 23 mmol/L (ref 22–32)
Calcium: 9.2 mg/dL (ref 8.9–10.3)
Chloride: 100 mmol/L (ref 98–111)
Creatinine, Ser: 0.85 mg/dL (ref 0.61–1.24)
GFR, Estimated: >60 mL/min (ref >60)
Glucose, Bld: 87 mg/dL (ref 70–99)
Potassium: 3.8 mmol/L (ref 3.5–5.1)
Sodium: 132 mmol/L — ABNORMAL LOW (ref 135–145)

## 2021-08-04 MED ORDER — OXYCODONE-ACETAMINOPHEN 5-325 MG PO TABS
1.0000 | ORAL_TABLET | Freq: Four times a day (QID) | ORAL | 0 refills | Status: DC | PRN
  Filled 2021-08-04: qty 20, 5d supply, fill #0
  Filled 2021-08-06: qty 60, 15d supply, fill #0

## 2021-08-04 MED ORDER — EPLERENONE 25 MG PO TABS
12.5000 mg | ORAL_TABLET | Freq: Every day | ORAL | 11 refills | Status: DC
  Filled 2021-08-04 – 2021-08-06 (×2): qty 15, 30d supply, fill #0
  Filled 2021-08-25: qty 15, 30d supply, fill #1
  Filled 2021-10-07: qty 15, 30d supply, fill #2
  Filled 2021-11-03: qty 15, 30d supply, fill #3
  Filled 2021-11-29: qty 15, 30d supply, fill #4
  Filled 2021-12-29: qty 15, 30d supply, fill #5
  Filled 2022-01-18: qty 15, 30d supply, fill #6
  Filled 2022-02-13: qty 15, 30d supply, fill #7
  Filled 2022-03-09: qty 15, 30d supply, fill #8
  Filled 2022-04-24: qty 15, 30d supply, fill #9

## 2021-08-04 NOTE — Patient Instructions (Addendum)
START Inspara 12.5 mg, one-half tab daily   Labs today We will only contact you if something comes back abnormal or we need to make some changes. Otherwise no news is good news!  Labs needed in one week and in six weeks  Keep follow up as scheduled   Do the following things EVERYDAY: Weigh yourself in the morning before breakfast. Write it down and keep it in a log. Take your medicines as prescribed Eat low salt foods--Limit salt (sodium) to 2000 mg per day.  Stay as active as you can everyday Limit all fluids for the day to less than 2 liters  At the [facility], you and your health needs are our priority. As part of our continuing mission to provide you with exceptional heart care, we have created designated Provider Care Teams. These Care Teams include your primary Cardiologist (physician) and Advanced Practice Providers (APPs- Physician Assistants and Nurse Practitioners) who all work together to provide you with the care you need, when you need it.   You may see any of the following providers on your designated Care Team at your next follow up: Dr Glori Bickers Dr Haynes Kerns, NP Lyda Jester, [place] [facility] [place], [place] Audry Riles, PharmD   Please be sure to bring in all your medications bottles to every appointment.   If you have any questions or concerns before your next appointment please send [place] a message through [place] or call our office at [phone].    TO LEAVE A MESSAGE FOR THE NURSE SELECT OPTION 2, PLEASE LEAVE A MESSAGE INCLUDING: YOUR NAME DATE OF BIRTH CALL BACK NUMBER REASON FOR CALL**this is important as we prioritize the call backs  YOU WILL RECEIVE A CALL BACK THE SAME DAY AS LONG AS YOU CALL BEFORE 4:00 PM

## 2021-08-06 ENCOUNTER — Other Ambulatory Visit (HOSPITAL_COMMUNITY): Payer: Self-pay

## 2021-08-06 ENCOUNTER — Encounter: Payer: Self-pay | Admitting: Hematology and Oncology

## 2021-08-07 ENCOUNTER — Encounter: Payer: Self-pay | Admitting: Hematology and Oncology

## 2021-08-07 ENCOUNTER — Telehealth: Payer: Self-pay | Admitting: *Deleted

## 2021-08-07 ENCOUNTER — Other Ambulatory Visit (HOSPITAL_COMMUNITY): Payer: Self-pay

## 2021-08-07 NOTE — Telephone Encounter (Signed)
Noted EPA approval of Percocet.  PER [employer].  "Approved per [facility] Medicaid of [place] on October 27  Approved. This drug has been approved. Approved quantity: 60 tablets per 15 day(s). You may fill up to a 34 day supply at a retail pharmacy. You may fill up to a 90 day supply for maintenance drugs, please refer to the formulary for details. Please call the pharmacy to process your prescription claim."  Faxed authorization to [facility] at this time.

## 2021-08-11 DIAGNOSIS — Z419 Encounter for procedure for purposes other than remedying health state, unspecified: Secondary | ICD-10-CM | POA: Diagnosis not present

## 2021-08-12 NOTE — Progress Notes (Signed)
..  Patient Assist/Replace for the following has been terminated. Medication: Rituxan (rituximab) Reason for Termination: Medicaid starting 07/11/2021. Last DOS: 06/26/2021. Marland KitchenJuan Quam, CPhT IV Drug Replacement Specialist [place] Phone: [phone]

## 2021-08-14 ENCOUNTER — Other Ambulatory Visit (HOSPITAL_COMMUNITY): Payer: Medicaid Other

## 2021-08-17 ENCOUNTER — Other Ambulatory Visit: Payer: Self-pay | Admitting: Hematology and Oncology

## 2021-08-17 ENCOUNTER — Inpatient Hospital Stay: Payer: Medicaid Other

## 2021-08-17 ENCOUNTER — Inpatient Hospital Stay: Payer: Medicaid Other | Admitting: Hematology and Oncology

## 2021-08-17 DIAGNOSIS — N62 Hypertrophy of breast: Secondary | ICD-10-CM

## 2021-08-18 ENCOUNTER — Ambulatory Visit (HOSPITAL_COMMUNITY)
Admission: RE | Admit: 2021-08-18 | Discharge: 2021-08-18 | Disposition: A | Discharge status: Home / Self Care | Payer: Medicaid Other | Source: Ambulatory Visit | Attending: Cardiology | Admitting: Cardiology

## 2021-08-18 ENCOUNTER — Encounter: Payer: Self-pay | Admitting: Hematology and Oncology

## 2021-08-18 ENCOUNTER — Other Ambulatory Visit (HOSPITAL_COMMUNITY): Payer: Self-pay

## 2021-08-18 ENCOUNTER — Inpatient Hospital Stay: Payer: Medicaid Other | Attending: Hematology and Oncology

## 2021-08-18 ENCOUNTER — Inpatient Hospital Stay (HOSPITAL_BASED_OUTPATIENT_CLINIC_OR_DEPARTMENT_OTHER): Payer: Medicaid Other | Admitting: Hematology and Oncology

## 2021-08-18 ENCOUNTER — Other Ambulatory Visit: Payer: Self-pay

## 2021-08-18 VITALS — BP 105/64 | HR 67 | Temp 97.5°F | Resp 17 | Wt 151.5 lb

## 2021-08-18 DIAGNOSIS — R112 Nausea with vomiting, unspecified: Secondary | ICD-10-CM | POA: Insufficient documentation

## 2021-08-18 DIAGNOSIS — I5022 Chronic systolic (congestive) heart failure: Secondary | ICD-10-CM | POA: Insufficient documentation

## 2021-08-18 DIAGNOSIS — D472 Monoclonal gammopathy: Secondary | ICD-10-CM | POA: Diagnosis not present

## 2021-08-18 DIAGNOSIS — M25532 Pain in left wrist: Secondary | ICD-10-CM | POA: Insufficient documentation

## 2021-08-18 DIAGNOSIS — C880 Waldenstrom macroglobulinemia: Secondary | ICD-10-CM | POA: Insufficient documentation

## 2021-08-18 DIAGNOSIS — Z885 Allergy status to narcotic agent status: Secondary | ICD-10-CM | POA: Insufficient documentation

## 2021-08-18 DIAGNOSIS — D649 Anemia, unspecified: Secondary | ICD-10-CM | POA: Insufficient documentation

## 2021-08-18 DIAGNOSIS — M25531 Pain in right wrist: Secondary | ICD-10-CM | POA: Insufficient documentation

## 2021-08-18 DIAGNOSIS — Z596 Low income: Secondary | ICD-10-CM | POA: Insufficient documentation

## 2021-08-18 DIAGNOSIS — R059 Cough, unspecified: Secondary | ICD-10-CM | POA: Insufficient documentation

## 2021-08-18 DIAGNOSIS — I110 Hypertensive heart disease with heart failure: Secondary | ICD-10-CM | POA: Insufficient documentation

## 2021-08-18 DIAGNOSIS — R0602 Shortness of breath: Secondary | ICD-10-CM | POA: Insufficient documentation

## 2021-08-18 DIAGNOSIS — R590 Localized enlarged lymph nodes: Secondary | ICD-10-CM | POA: Insufficient documentation

## 2021-08-18 DIAGNOSIS — Z8249 Family history of ischemic heart disease and other diseases of the circulatory system: Secondary | ICD-10-CM | POA: Insufficient documentation

## 2021-08-18 DIAGNOSIS — Z79899 Other long term (current) drug therapy: Secondary | ICD-10-CM | POA: Insufficient documentation

## 2021-08-18 DIAGNOSIS — Z5941 Food insecurity: Secondary | ICD-10-CM | POA: Insufficient documentation

## 2021-08-18 DIAGNOSIS — Z87891 Personal history of nicotine dependence: Secondary | ICD-10-CM | POA: Insufficient documentation

## 2021-08-18 LAB — CBC WITH DIFFERENTIAL (CANCER CENTER ONLY)
Abs Immature Granulocytes: 0.02 10*3/uL (ref 0.00–0.07)
Basophils Absolute: 0 10*3/uL (ref 0.0–0.1)
Basophils Relative: 0 %
Eosinophils Absolute: 0.8 10*3/uL — ABNORMAL HIGH (ref 0.0–0.5)
Eosinophils Relative: 11 %
HCT: 39.2 % (ref 39.0–52.0)
Hemoglobin: 12.5 g/dL — ABNORMAL LOW (ref 13.0–17.0)
Immature Granulocytes: 0 %
Lymphocytes Relative: 17 %
Lymphs Abs: 1.3 10*3/uL (ref 0.7–4.0)
MCH: 25.8 pg — ABNORMAL LOW (ref 26.0–34.0)
MCHC: 31.9 g/dL (ref 30.0–36.0)
MCV: 81 fL (ref 80.0–100.0)
Monocytes Absolute: 0.6 10*3/uL (ref 0.1–1.0)
Monocytes Relative: 7 %
Neutro Abs: 5 10*3/uL (ref 1.7–7.7)
Neutrophils Relative %: 65 %
Platelet Count: 261 10*3/uL (ref 150–400)
RBC: 4.84 MIL/uL (ref 4.22–5.81)
RDW: 17.2 % — ABNORMAL HIGH (ref 11.5–15.5)
WBC Count: 7.8 10*3/uL (ref 4.0–10.5)
nRBC: 0 % (ref 0.0–0.2)

## 2021-08-18 LAB — CMP (CANCER CENTER ONLY)
ALT: 8 U/L (ref 0–44)
AST: 14 U/L — ABNORMAL LOW (ref 15–41)
Albumin: 4 g/dL (ref 3.5–5.0)
Alkaline Phosphatase: 95 U/L (ref 38–126)
Anion gap: 10 (ref 5–15)
BUN: 11 mg/dL (ref 6–20)
CO2: 24 mmol/L (ref 22–32)
Calcium: 8.8 mg/dL — ABNORMAL LOW (ref 8.9–10.3)
Chloride: 101 mmol/L (ref 98–111)
Creatinine: 0.9 mg/dL (ref 0.61–1.24)
GFR, Estimated: >60 mL/min (ref >60)
Glucose, Bld: 107 mg/dL — ABNORMAL HIGH (ref 70–99)
Potassium: 4 mmol/L (ref 3.5–5.1)
Sodium: 135 mmol/L (ref 135–145)
Total Bilirubin: 0.6 mg/dL (ref 0.3–1.2)
Total Protein: 6.2 g/dL — ABNORMAL LOW (ref 6.5–8.1)

## 2021-08-18 LAB — BASIC METABOLIC PANEL
Anion gap: 10 (ref 5–15)
BUN: 11 mg/dL (ref 6–20)
CO2: 24 mmol/L (ref 22–32)
Calcium: 9 mg/dL (ref 8.9–10.3)
Chloride: 99 mmol/L (ref 98–111)
Creatinine, Ser: 0.87 mg/dL (ref 0.61–1.24)
GFR, Estimated: >60 mL/min (ref >60)
Glucose, Bld: 91 mg/dL (ref 70–99)
Potassium: 4.2 mmol/L (ref 3.5–5.1)
Sodium: 133 mmol/L — ABNORMAL LOW (ref 135–145)

## 2021-08-18 LAB — LACTATE DEHYDROGENASE: LDH: 116 U/L (ref 98–192)

## 2021-08-18 MED ORDER — OXYCODONE-ACETAMINOPHEN 5-325 MG PO TABS
1.0000 | ORAL_TABLET | Freq: Four times a day (QID) | ORAL | 0 refills | Status: DC | PRN
  Filled 2021-08-18: qty 60, 15d supply, fill #0

## 2021-08-18 MED ORDER — ONDANSETRON HCL 8 MG PO TABS
8.0000 mg | ORAL_TABLET | Freq: Three times a day (TID) | ORAL | 0 refills | Status: DC | PRN
  Filled 2021-08-18: qty 30, 10d supply, fill #0

## 2021-08-18 MED ORDER — OLANZAPINE 5 MG PO TABS
5.0000 mg | ORAL_TABLET | Freq: Every day | ORAL | 3 refills | Status: DC
  Filled 2021-08-18: qty 30, 30d supply, fill #0
  Filled 2021-09-23: qty 30, 30d supply, fill #1

## 2021-08-18 NOTE — Progress Notes (Signed)
[facility] [phone]   [phone]  PROGRESS NOTE  Patient Care Team: Adron Bene, MD as PCP - General (Internal Medicine)  Hematological/Oncological History # Waldenstrom's Macroglobulinemia # IgM Monoclonal Gammopathy 04/07/2021: CT A/p showed mildly enlarged retroperitoneal lymph nodes and bilateral inguinal lymph nodes.  04/08/2021: IgM Kappa M protein 1.2 04/09/2021: WBC 11.1, Hgb 9.5, MCV 75.4, Plt 525 04/20/2021: Kappa 977, Lambda 7.8, K/L ratio 125.28. Serum viscosity 1.8 05/06/2021: Establish care with Dr. 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 Infusion reaction with ritux, held halfway through.  06/12/2021: Cycle 2 Day 1 of Ibrutinib + Rituximab 06/19/2021: Cycle 3 Day 1 of Ibrutinib + Rituximab 06/26/2021: completed 4 weeks of rituximab. Continued on daily ibrutinib   Interval History:  Connor Solomon 60 y.o. male with medical history significant for Waldenstrom's macroglobulinemia who presents for a follow up visit. The patient's last visit was on 07/17/2021. In the interim since the last visit he has continued on ibrutinib therapy.  On exam today Connor Solomon notes he has been having difficulty with nausea and vomiting.  Since her last visit.  He reports that just about every other day he has bouts of nausea resulting vomiting.  He has been taking his Zofran medication but it has provided him little relief.  He is requesting something stronger.  He notes his appetite is also been up issue due to this frequent bouts of nausea and vomiting.  He notes that the vomitus typically consist of the food he eats and does not have anything dark, green, or yellow.  He also notes that he is been having some pain in his joints, notably his ankles bilaterally as well as his  wrist.  He reports he is not having any issues with shortness of breath, headache, vision changes, or chest pain.  Overall he is tolerating the medication well no other questions concerns or complaints.  Full 10 point ROS is listed below.  MEDICAL HISTORY:  Past Medical History:  Diagnosis Date   Acute heart failure (HCC) 04/08/2021   Acute HFrEF (heart failure with reduced ejection fraction) (HCC) 04/07/2021   Arthritis    hands, elbows bilaterally   Cancer (HCC)    Full dentures    Hypertension    Pt states he doen not have HTN and has never been treated for HTN   Kidney stone on left side last stone 4-5 yrs ago   recurrent (3 episodes)   Panic disorder    pt states has resolved   Syncope    resolved- was related to panic attacks    SURGICAL HISTORY: Past Surgical History:  Procedure Laterality Date   CARPAL METACARPAL FUSION WITH DISTAL RADIAL BONE GRAFT Left 05/30/2013   Procedure: LEFT THUMB METACARPAL JOINT FUSION;  Surgeon: [name], MD;  Location: [facility];  Service: Orthopedics;  Laterality: Left;   ESOPHAGOGASTRODUODENOSCOPY N/A 02/12/2014   Procedure: ESOPHAGOGASTRODUODENOSCOPY (EGD);  Surgeon: [name], MD;  Location: [facility];  Service: Endoscopy;  Laterality: N/A;   FOREIGN BODY REMOVAL N/A 02/12/2014   Procedure: FOREIGN BODY REMOVAL;  Surgeon: [name], MD;  Location: [facility] ENDOSCOPY;  Service: Endoscopy;  Laterality: N/A;   OPEN REDUCTION INTERNAL FIXATION (ORIF) DISTAL RADIAL FRACTURE Left 11/29/2012   Procedure: OPEN REDUCTION INTERNAL FIXATION (ORIF) DISTAL RADIAL FRACTURE;  Surgeon: [name], MD;  Location: [place];  Service: Orthopedics;  Laterality: Left;  LEFT DISTAL RADIUS OSTEOTOMY WITH BONE GRAFT   RIGHT/LEFT HEART CATH AND CORONARY ANGIOGRAPHY N/A 04/09/2021   Procedure: RIGHT/LEFT HEART CATH AND CORONARY ANGIOGRAPHY;  Surgeon: Jolaine Artist, MD;  Location: [place] [facility];  Service:  Cardiovascular;  Laterality: N/A;   TENDON REPAIR Left 02/07/2013   Procedure: LEFT EXTENSOR INDICIS PROPRIUS  TO EXTENSOR POLLICIS LONGUS TENDON TRANSFER;  Surgeon: Schuyler Amor, MD;  Location: [place];  Service: Orthopedics;  Laterality: Left;   WISDOM TOOTH EXTRACTION     WRIST SURGERY     fx only    SOCIAL HISTORY: Social History   Socioeconomic History   Marital status: Single    Spouse name: Not on file   Number of children: Not on file   Years of education: Not on file   Highest education level: Not on file  Occupational History   Not on file  Tobacco Use   Smoking status: Former    Types: Cigars    Quit date: 10/11/2020    Years since quitting: 0.8   Smokeless tobacco: Never   Tobacco comments:    smokes 1 cigar a week  Substance and Sexual Activity   Alcohol use: No   Drug use: No   Sexual activity: Not on file  Other Topics Concern   Not on file  Social History Narrative   Not on file   Social Determinants of Health   Financial Resource Strain: High Risk   Difficulty of Paying Living Expenses: Very hard  Food Insecurity: Food Insecurity Present   Worried About [place] in the Last Year: Sometimes true   Ran Out of Food in the Last Year: Sometimes true  Transportation Needs: No Transportation Needs   Lack of Transportation (Medical): No   Lack of Transportation (Non-Medical): No  Physical Activity: Not on file  Stress: Not on file  Social Connections: Not on file  Intimate Partner Violence: Not on file    FAMILY HISTORY: Family History  Problem Relation Age of Onset   Heart attack Father    Sudden Cardiac Death Father 72   Diabetes Neg Hx    Hyperlipidemia Neg Hx    Hypertension Neg Hx     ALLERGIES:  is allergic to rituxan [rituximab] and hydrocodone.  MEDICATIONS:  Current Outpatient Medications  Medication Sig Dispense Refill   acetaminophen (TYLENOL) 500 MG tablet Take 500 mg by mouth every 6 (six) hours  as needed.     albuterol (PROAIR HFA) 108 (90 Base) MCG/ACT inhaler Inhale 2 puffs into the lungs every 6 (six) hours as needed for wheezing or shortness of breath. 8.5 g 2   allopurinol (ZYLOPRIM) 300 MG tablet Take 1 tablet (300 mg total) by mouth daily. 90 tablet 1   carvedilol (COREG) 3.125 MG tablet Take 1 tablet (3.125 mg total) by mouth 2 (two) times daily. 68 tablet 2   empagliflozin (JARDIANCE) 10 MG TABS tablet Take 1 tablet (10 mg total) by mouth daily before breakfast. 30 tablet 11   eplerenone (INSPRA) 25 MG tablet Take 1/2 tablet (12.5 mg total) by mouth daily. 15 tablet 11   furosemide (LASIX) 20 MG tablet Take 1 tablet (20 mg total) by mouth daily as needed for swelling 100 tablet 2   ibrutinib 420 MG TABS Take 420 mg by mouth daily. 30 tablet 2   OLANZapine (ZYPREXA) 5 MG tablet Take 1 tablet (5 mg  total) by mouth at bedtime. 30 tablet 3   sacubitril-valsartan (ENTRESTO) 24-26 MG Take 1 tablet by mouth 2 (two) times daily. 60 tablet 5   ondansetron (ZOFRAN) 8 MG tablet Take 1 tablet by mouth every 8 hours as needed. 30 tablet 0   oxyCODONE-acetaminophen (PERCOCET) 5-325 MG tablet Take 1 tablet by mouth every 6 hours as needed for severe pain. 60 tablet 0   No current facility-administered medications for this visit.    REVIEW OF SYSTEMS:   Constitutional: ( - ) fevers, ( - )  chills , ( - ) night sweats Eyes: ( - ) blurriness of vision, ( - ) double vision, ( - ) watery eyes Ears, nose, mouth, throat, and face: ( - ) mucositis, ( - ) sore throat Respiratory: ( - ) cough, ( - ) dyspnea, ( - ) wheezes Cardiovascular: ( - ) palpitation, ( - ) chest discomfort, ( - ) lower extremity swelling Gastrointestinal:  ( - ) nausea, ( - ) heartburn, ( - ) change in bowel habits Skin: ( - ) abnormal skin rashes Lymphatics: ( - ) new lymphadenopathy, ( - ) easy bruising Neurological: ( - ) numbness, ( - ) tingling, ( - ) new weaknesses Behavioral/Psych: ( - ) mood change, ( - ) new  changes  All other systems were reviewed with the patient and are negative.  PHYSICAL EXAMINATION: ECOG PERFORMANCE STATUS: 1 - Symptomatic but completely ambulatory  Vitals:   08/18/21 1046  BP: 105/64  Pulse: 67  Resp: 17  Temp: (!) 97.5 F (36.4 C)  SpO2: 99%   Filed Weights   08/18/21 1046  Weight: 151 lb 8 oz (68.7 kg)    GENERAL: Well-appearing middle-aged Caucasian male, alert, no distress and comfortable SKIN: skin color, texture, turgor are normal, no rashes or significant lesions EYES: conjunctiva are pink and non-injected, sclera clear LUNGS: clear to auscultation and percussion with normal breathing effort HEART: regular rate & rhythm and no murmurs and no lower extremity edema  PSYCH: alert & oriented x 3, fluent speech NEURO: no focal motor/sensory deficits  LABORATORY DATA:  I have reviewed the data as listed CBC Latest Ref Rng & Units 08/18/2021 07/17/2021 07/03/2021  WBC 4.0 - 10.5 K/uL 7.8 6.3 9.0  Hemoglobin 13.0 - 17.0 g/dL 12.5(L) 10.5(L) 10.5(L)  Hematocrit 39.0 - 52.0 % 39.2 33.4(L) 32.6(L)  Platelets 150 - 400 K/uL 261 315 202    CMP Latest Ref Rng & Units 08/18/2021 08/04/2021 07/17/2021  Glucose 70 - 99 mg/dL 107(H) 87 97  BUN 6 - 20 mg/dL $Remove'11 10 13  'mtGcLRU$ Creatinine 0.61 - 1.24 mg/dL 0.90 0.85 0.84  Sodium 135 - 145 mmol/L 135 132(L) 135  Potassium 3.5 - 5.1 mmol/L 4.0 3.8 3.8  Chloride 98 - 111 mmol/L 101 100 101  CO2 22 - 32 mmol/L $RemoveB'24 23 26  'JlVtgYEf$ Calcium 8.9 - 10.3 mg/dL 8.8(L) 9.2 8.9  Total Protein 6.5 - 8.1 g/dL 6.2(L) - 5.9(L)  Total Bilirubin 0.3 - 1.2 mg/dL 0.6 - 0.4  Alkaline Phos 38 - 126 U/L 95 - 86  AST 15 - 41 U/L 14(L) - 10(L)  ALT 0 - 44 U/L 8 - 6    Lab Results  Component Value Date   MPROTEIN 1.2 (H) 04/08/2021   Lab Results  Component Value Date   KAPLAMBRATIO 136.50 (H) 07/09/2021   KAPLAMBRATIO 875.18 (H) 05/12/2021    RADIOGRAPHIC STUDIES: LONG TERM MONITOR (3-14 DAYS)  Result Date: 08/08/2021 Patch Wear Time:  13 days  and 13 hours (2022-10-05T12:08:21-0400 to 2022-10-19T01:18:12-398) 1.  Sinus rhythm -  avg HR of 75 bpm. 2.  4 runs of nonsustained Ventricular Tachycardia occurred - the run with the fastest interval lasting 7 beats with a max rate of 193 bpm, the longest lasting 20 beats with an avg rate of 109 bpm. 3.  64  Runs of Supraventricular Tachycardia occurred, the run with the fastest interval lasting 9.5 secs with a max rate of 203 bpm, the longest lasting 18.9 secs with an avg rate of 115 bpm. 4. Rare PACs and PVCs Glori Bickers, MD 1:43 PM   ASSESSMENT & PLAN Connor Solomon 60 y.o. male with medical history significant for Waldenstrom's macroglobulinemia who presents for a follow up visit.   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  There is no clear evidence of hyperviscosity syndrome at this time.  At this time we will plan to proceed with ibrutinib and rituximab therapy.  One could also consider Bendamustine and rituximab therapy but given the patient's degree of anemia I would prefer pursuing ibrutinib and rituximab at this time.  Additionally ibrutinib/rituximab are category 1 recommendations per the NCCN guidelines.  The treatment will consist of ibrutinib 420 mg p.o. daily with rituximab on weeks 1 through 4 as well as weeks 27 through 30.  Previously we discussed the risks and benefits of therapy including (but not limited to) risk of bleeding, fatigue, atypical infections, and cardiac arrhythmias.  The patient voices understanding of this plan moving forward.  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  This is confirmed with an IgM monoclonal colopathy and bone marrow biopsy results showing a lymphoplasmacytic lymphoma --No  clear signs of hyperviscosity syndrome at this time. --will order monthly SPEP, SFLC, and UPEP --plan for next round of rituximab to start on Dec 04, 2021  --RTC in 4 weeks to assure he is improving  # Shortness of Breath/Cough #Concern for Pneumonia --improved from prior --continue to monitor.   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    -- At this time the patient's hemoglobin is most consistent with anemia due to lymphomatous involvement of bone marrow --There does appear to be a component of iron deficiency anemia as well.  Recommend the patient continue p.o. iron therapy --Continue to monitor.  #Supportive Care -- chemotherapy education complete -- port placement not required -- allopurinol 300mg  PO daily for TLS prophylaxis --zofran and compazine PRN.  -- due to persistent nausea will add Olanzapine 5mg  PO daily on 08/18/2021.    No orders of the defined types were placed in this encounter.   All questions were answered. The patient knows to call the clinic with any problems, questions or concerns.  A total of more than 30 minutes were spent on this encounter with face-to-face time and non-face-to-face time, including preparing to see the patient, ordering tests and/or medications, counseling the patient and coordination of care as outlined above.   Connor Peoples, MD Department of Hematology/Oncology [place] [facility] Phone: [phone] Pager: [phone] Email: [email or web address]  08/18/2021 12:06 PM  Dimopoulos MA, Rennie Natter, Trotman J, Basilia Jumbo, Leblond V, Mahe B, Herbaux C, Tam C, Orsucci L, Pal[place] ML, Matous JV, Shustik C, Kastritis E, [place], Li J, Salman Z,  Graef T, Buske C; iNNOVATE Study Group and the [employer] for [employer]. Phase 3 Trial of Ibrutinib plus Rituximab in Waldenstrm's Macroglobulinemia. Alison Stalling J Med. 2018 Jun 21;378(25):2399-2410.    --At 30 months, the progression-free survival rate was 82% with ibrutinib-rituximab versus 28% with placebo-rituximab (hazard ratio for progression or death, 0.20; P<0.001).

## 2021-08-19 ENCOUNTER — Other Ambulatory Visit (HOSPITAL_COMMUNITY): Payer: Self-pay

## 2021-08-19 LAB — KAPPA/LAMBDA LIGHT CHAINS
Kappa free light chain: 43.1 mg/L — ABNORMAL HIGH (ref 3.3–19.4)
Kappa, lambda light chain ratio: 7.43 — ABNORMAL HIGH (ref 0.26–1.65)
Lambda free light chains: 5.8 mg/L (ref 5.7–26.3)

## 2021-08-24 LAB — MULTIPLE MYELOMA PANEL, SERUM
Albumin SerPl Elph-Mcnc: 3.8 g/dL (ref 2.9–4.4)
Albumin/Glob SerPl: 1.9 — ABNORMAL HIGH (ref 0.7–1.7)
Alpha 1: 0.2 g/dL (ref 0.0–0.4)
Alpha2 Glob SerPl Elph-Mcnc: 0.6 g/dL (ref 0.4–1.0)
B-Globulin SerPl Elph-Mcnc: 0.8 g/dL (ref 0.7–1.3)
Gamma Glob SerPl Elph-Mcnc: 0.4 g/dL (ref 0.4–1.8)
Globulin, Total: 2.1 g/dL — ABNORMAL LOW (ref 2.2–3.9)
IgA: 11 mg/dL — ABNORMAL LOW (ref 90–386)
IgG (Immunoglobin G), Serum: 281 mg/dL — ABNORMAL LOW (ref 603–1613)
IgM (Immunoglobulin M), Srm: 206 mg/dL — ABNORMAL HIGH (ref 20–172)
M Protein SerPl Elph-Mcnc: 0.2 g/dL — ABNORMAL HIGH
Total Protein ELP: 5.9 g/dL — ABNORMAL LOW (ref 6.0–8.5)

## 2021-08-25 ENCOUNTER — Other Ambulatory Visit (HOSPITAL_COMMUNITY): Payer: Self-pay

## 2021-08-25 ENCOUNTER — Encounter: Payer: Self-pay | Admitting: Hematology and Oncology

## 2021-08-26 ENCOUNTER — Other Ambulatory Visit: Payer: Self-pay | Admitting: Hematology and Oncology

## 2021-08-26 DIAGNOSIS — N62 Hypertrophy of breast: Secondary | ICD-10-CM

## 2021-08-27 ENCOUNTER — Encounter: Payer: Self-pay | Admitting: Hematology and Oncology

## 2021-08-27 ENCOUNTER — Other Ambulatory Visit (HOSPITAL_COMMUNITY): Payer: Self-pay

## 2021-08-27 MED ORDER — OXYCODONE-ACETAMINOPHEN 5-325 MG PO TABS
1.0000 | ORAL_TABLET | Freq: Four times a day (QID) | ORAL | 0 refills | Status: DC | PRN
  Filled 2021-08-27 (×2): qty 60, 15d supply, fill #0

## 2021-08-29 ENCOUNTER — Emergency Department (HOSPITAL_COMMUNITY)
Admission: EM | Admit: 2021-08-29 | Discharge: 2021-08-29 | Disposition: A | Discharge status: Home / Self Care | Payer: Medicaid Other | Source: Home / Self Care | Attending: Emergency Medicine | Admitting: Emergency Medicine

## 2021-08-29 ENCOUNTER — Other Ambulatory Visit: Payer: Self-pay

## 2021-08-29 ENCOUNTER — Other Ambulatory Visit (HOSPITAL_COMMUNITY): Payer: Self-pay

## 2021-08-29 ENCOUNTER — Encounter (HOSPITAL_COMMUNITY): Payer: Self-pay | Admitting: Emergency Medicine

## 2021-08-29 ENCOUNTER — Emergency Department (HOSPITAL_COMMUNITY)
Admission: EM | Admit: 2021-08-29 | Discharge: 2021-08-29 | Disposition: A | Discharge status: Home / Self Care | Payer: Medicaid Other | Attending: Emergency Medicine | Admitting: Emergency Medicine

## 2021-08-29 DIAGNOSIS — Z87891 Personal history of nicotine dependence: Secondary | ICD-10-CM | POA: Diagnosis not present

## 2021-08-29 DIAGNOSIS — Z955 Presence of coronary angioplasty implant and graft: Secondary | ICD-10-CM | POA: Insufficient documentation

## 2021-08-29 DIAGNOSIS — Z859 Personal history of malignant neoplasm, unspecified: Secondary | ICD-10-CM | POA: Diagnosis not present

## 2021-08-29 DIAGNOSIS — F419 Anxiety disorder, unspecified: Secondary | ICD-10-CM | POA: Insufficient documentation

## 2021-08-29 DIAGNOSIS — I119 Hypertensive heart disease without heart failure: Secondary | ICD-10-CM | POA: Insufficient documentation

## 2021-08-29 DIAGNOSIS — I110 Hypertensive heart disease with heart failure: Secondary | ICD-10-CM | POA: Insufficient documentation

## 2021-08-29 DIAGNOSIS — I5022 Chronic systolic (congestive) heart failure: Secondary | ICD-10-CM | POA: Insufficient documentation

## 2021-08-29 DIAGNOSIS — Z79899 Other long term (current) drug therapy: Secondary | ICD-10-CM | POA: Diagnosis not present

## 2021-08-29 DIAGNOSIS — Z5321 Procedure and treatment not carried out due to patient leaving prior to being seen by health care provider: Secondary | ICD-10-CM | POA: Insufficient documentation

## 2021-08-29 MED ORDER — HYDROXYZINE HCL 25 MG PO TABS
25.0000 mg | ORAL_TABLET | Freq: Four times a day (QID) | ORAL | 0 refills | Status: AC
  Filled 2021-08-29: qty 56, 14d supply, fill #0

## 2021-08-29 NOTE — ED Provider Notes (Signed)
[facility] DEPT Provider Note   CSN: [id number] Arrival date & time: 08/29/21  1034     History Chief Complaint  Patient presents with   Anxiety    CHRISANGEL ESKENAZI is a 60 y.o. male with history of CHF and lymphoplasmacytic lymphoma who presents to the emergency department complaining of anxiety. He reports increased stress in his life in the past several months due to terminal cancer diagnosis, loss of job and income, and awaiting disability. Recently finished chemotherapy. He denies feelings of depression, but states he is feeling very overwhelmed. He denies suicidal or homicidal ideation, denies visual or auditory hallucinations. He is hoping to be started on medication for anxiety until he is able to follow up with his primary doctor.    Anxiety      Past Medical History:  Diagnosis Date   Acute heart failure ([place]) 04/08/2021   Acute HFrEF (heart failure with reduced ejection fraction) ([place]) 04/07/2021   Arthritis    hands, elbows bilaterally   Cancer ([place])    Full dentures    Hypertension    Pt states he doen not have HTN and has never been treated for HTN   Kidney stone on left side last stone 4-5 yrs ago   recurrent (3 episodes)   Panic disorder    pt states has resolved   Syncope    resolved- was related to panic attacks    Patient Active Problem List   Diagnosis Date Noted   Pain and swelling of left upper extremity 07/16/2021   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 06/29/2021   Dyspnea 06/11/2021   Gynecomastia 06/04/2021   Osteoarthritis (arthritis due to wear and tear of joints) 06/04/2021   Screening for depression 06/04/2021   Waldenstrom macroglobulinemia ([place]) 05/24/2021   Rash in adult 05/18/2021   Tinnitus aurium, 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   HFrEF (heart failure with reduced ejection fraction) ([place]) 04/20/2021   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   Pleuritic pain 04/20/2021   Chronic  systolic heart failure ([place]) 04/17/2021   Weight loss, unintentional 04/08/2021   Microcytic anemia 04/08/2021   Mitral regurgitation 04/08/2021   Benign essential hypertension 09/22/2009    Past Surgical History:  Procedure Laterality Date   CARPAL METACARPAL FUSION WITH DISTAL RADIAL BONE GRAFT Left 05/30/2013   Procedure: LEFT THUMB METACARPAL JOINT FUSION;  Surgeon: Schuyler Amor, MD;  Location: [place];  Service: Orthopedics;  Laterality: Left;   ESOPHAGOGASTRODUODENOSCOPY N/A 02/12/2014   Procedure: ESOPHAGOGASTRODUODENOSCOPY (EGD);  Surgeon: Jerene Bears, MD;  Location: [facility] ENDOSCOPY;  Service: Endoscopy;  Laterality: N/A;   FOREIGN BODY REMOVAL N/A 02/12/2014   Procedure: FOREIGN BODY REMOVAL;  Surgeon: Jerene Bears, MD;  Location: [place];  Service: Endoscopy;  Laterality: N/A;   OPEN REDUCTION INTERNAL FIXATION (ORIF) DISTAL RADIAL FRACTURE Left 11/29/2012   Procedure: OPEN REDUCTION INTERNAL FIXATION (ORIF) DISTAL RADIAL FRACTURE;  Surgeon: Schuyler Amor, MD;  Location: [place];  Service: Orthopedics;  Laterality: Left;  LEFT DISTAL RADIUS OSTEOTOMY WITH BONE GRAFT   RIGHT/LEFT HEART CATH AND CORONARY ANGIOGRAPHY N/A 04/09/2021   Procedure: RIGHT/LEFT HEART CATH AND CORONARY ANGIOGRAPHY;  Surgeon: Jolaine Artist, MD;  Location: [facility];  Service: Cardiovascular;  Laterality: N/A;   TENDON REPAIR Left 02/07/2013   Procedure: LEFT EXTENSOR INDICIS PROPRIUS  TO EXTENSOR POLLICIS LONGUS TENDON TRANSFER;  Surgeon: Schuyler Amor, MD;  Location: [place];  Service: Orthopedics;  Laterality: Left;   WISDOM TOOTH EXTRACTION     WRIST SURGERY     fx only       Family History  Problem Relation Age of Onset   Heart attack Father    Sudden Cardiac Death Father 26   Diabetes Neg Hx    Hyperlipidemia Neg Hx    Hypertension Neg Hx     Social History   Tobacco Use   Smoking status: Former    Types:  Cigars    Quit date: 10/11/2020    Years since quitting: 0.8   Smokeless tobacco: Never   Tobacco comments:    smokes 1 cigar a week  Substance Use Topics   Alcohol use: No   Drug use: No    Home Medications Prior to Admission medications   Medication Sig Start Date End Date Taking? Authorizing Provider  hydrOXYzine (ATARAX/VISTARIL) 25 MG tablet Take 1 tablet (25 mg total) by mouth every 6 (six) hours 08/29/21 09/12/21 Yes Gursimran Litaker T, PA-C  acetaminophen (TYLENOL) 500 MG tablet Take 500 mg by mouth every 6 (six) hours as needed.    [provider]  albuterol (PROAIR HFA) 108 (90 Base) MCG/ACT inhaler Inhale 2 puffs into the lungs every 6 (six) hours as needed for wheezing or shortness of breath. 06/10/21   Madalyn Rob, MD  allopurinol (ZYLOPRIM) 300 MG tablet Take 1 tablet (300 mg total) by mouth daily. 06/05/21   Orson Slick, MD  carvedilol (COREG) 3.125 MG tablet Take 1 tablet (3.125 mg total) by mouth 2 (two) times daily. 04/17/21   [place], Maricela Bo, FNP  empagliflozin (JARDIANCE) 10 MG TABS tablet Take 1 tablet (10 mg total) by mouth daily before breakfast. 07/15/21   Bensimhon, Shaune Pascal, MD  eplerenone (INSPRA) 25 MG tablet Take 1/2 tablet (12.5 mg total) by mouth daily. 08/04/21   [place], Maricela Bo, FNP  furosemide (LASIX) 20 MG tablet Take 1 tablet (20 mg total) by mouth daily as needed for swelling 05/08/21     ibrutinib 420 MG TABS Take 420 mg by mouth daily. 05/24/21   Orson Slick, MD  OLANZapine (ZYPREXA) 5 MG tablet Take 1 tablet (5 mg total) by mouth at bedtime. 08/18/21   Orson Slick, MD  ondansetron (ZOFRAN) 8 MG tablet Take 1 tablet by mouth every 8 hours as needed. 08/18/21   Orson Slick, MD  oxyCODONE-acetaminophen (PERCOCET) 5-325 MG tablet Take 1 tablet by mouth every 6 hours as needed for severe pain. 08/27/21   Orson Slick, MD  sacubitril-valsartan (ENTRESTO) 24-26 MG Take 1 tablet by mouth 2 (two) times daily. 06/22/21   Rafael Bihari, FNP    Allergies    Rituxan [rituximab] and Hydrocodone  Review of Systems   Review of Systems  Psychiatric/Behavioral:  Positive for sleep disturbance. Negative for hallucinations, self-injury and suicidal ideas. The patient is nervous/anxious.    Physical Exam Updated Vital Signs BP 122/84 (BP Location: Left Arm)   Pulse 95   Temp 97.9 F (36.6 C) (Oral)   Resp 18   Ht $R'5\' 8"'BC$  (1.727 m)   Wt 68 kg   SpO2 99%   BMI 22.81 kg/m   Physical Exam Vitals and nursing note reviewed.  Constitutional:      Appearance: Normal appearance.  HENT:     Head: Normocephalic and atraumatic.  Eyes:     Conjunctiva/sclera: Conjunctivae normal.  Pulmonary:     Effort: Pulmonary effort is  normal. No respiratory distress.  Skin:    General: Skin is warm and dry.  Neurological:     Mental Status: He is alert.  Psychiatric:        Attention and Perception: Attention and perception normal. He does not perceive auditory or visual hallucinations.        Mood and Affect: Affect normal. Mood is anxious.        Speech: Speech normal.        Behavior: Behavior normal.        Thought Content: Thought content normal. Thought content does not include homicidal or suicidal ideation.        Judgment: Judgment normal.    ED Results / Procedures / Treatments   Labs (all labs ordered are listed, but only abnormal results are displayed) Labs Reviewed - No data to display  EKG None  Radiology No results found.  Procedures Procedures   Medications Ordered in ED Medications - No data to display  ED Course  I have reviewed the triage vital signs and the nursing notes.  Pertinent labs & imaging results that were available during my care of the patient were reviewed by me and considered in my medical decision making (see chart for details).    MDM Rules/Calculators/A&P                           Patient is 60 year old male who presents to the emergency department with complaints of  anxiety.  Patient states he has had similar symptoms for several months, but has been worse due to increased stress in his life related to cancer diagnosis and trying to obtain disability.  He states he has constant worrying thoughts that are making it difficult for him to sleep.  Patient adamantly denies depression, suicidal ideation, homicidal ideation, visual or auditory hallucinations.  On exam patient has nervous mood, normal affect.  Otherwise normal psychiatric evaluation as above.  I do not believe that he is a danger to himself or others, and is not requiring further evaluation for his symptoms at this time.  He has no other complaints on my exam, and solely wants to evaluated for his anxiety today.  Discussed with patient's importance of following up with his oncologist for long-term management of his anxiety.  Will prescribe short-term course of hydroxyzine to tide him over until his next oncologist appointment.  Discussed reasons to return to the emergency department.  Patient agreeable to plan.  Final Clinical Impression(s) / ED Diagnoses Final diagnoses:  Anxiety    Rx / DC Orders ED Discharge Orders          Ordered    hydrOXYzine (ATARAX/VISTARIL) 25 MG tablet  Every 6 hours        08/29/21 1151             Jenina Moening T, PA-C 08/29/21 1222    Horton, Alvin Critchley, DO 08/29/21 1537

## 2021-08-29 NOTE — ED Triage Notes (Addendum)
Patient reports he has terminal cancer and is stressed out and has anxiety. Says he is out of work, no income and about to lose everything. Patient says he is not depressed. Denies pain just feels overwhelmed. Recently finished chemo. Requesting medication for anxiety until he is able to see his doctor.

## 2021-08-29 NOTE — ED Triage Notes (Signed)
Patient here with stress and anxiety due to financial situations.  Patient states that he needs something for nerves.  Patient states that he spoke to his [facility] MD who told him to come to ED.

## 2021-08-29 NOTE — Discharge Instructions (Addendum)
You were seen in the emergency department for anxiety.  As we discussed I'd like you to prescribe you a medicine called hydroxyzine to tide you over until you can follow up with your cancer doctor. Hydroxyzine is a medicine that works similarly to benadryl and can make you sleepy, so I would advise first taking it at night before going to bed.  Continue to monitor how you're doing and return to the ER for new or worsening symptoms such as suicidal thoughts.   It has been a pleasure seeing and caring for you today and I hope you start feeling better soon!

## 2021-08-31 ENCOUNTER — Telehealth: Payer: Self-pay

## 2021-08-31 ENCOUNTER — Telehealth: Payer: Self-pay | Admitting: *Deleted

## 2021-08-31 ENCOUNTER — Other Ambulatory Visit (HOSPITAL_COMMUNITY): Payer: Self-pay

## 2021-08-31 ENCOUNTER — Other Ambulatory Visit: Payer: Self-pay | Admitting: *Deleted

## 2021-08-31 DIAGNOSIS — C880 Waldenstrom macroglobulinemia: Secondary | ICD-10-CM

## 2021-08-31 NOTE — Telephone Encounter (Signed)
Received message from weekend on call that pt was feeling very anxious-about his diagnosis, his wait on disability and general life circumstances.  It appears he went to the ED for this and was given Hydroxyzine as needed for anxiety. Attempted call on his home phone and mobile phone. No answer. Unable to leave vm message  Social work referral placed for pt 's anxiety,

## 2021-08-31 NOTE — Telephone Encounter (Signed)
Transition Care Management Unsuccessful Follow-up Telephone Call  Date of discharge and from where:  08/29/2021 from [facility]  Attempts:  1st Attempt  Reason for unsuccessful TCM follow-up call:  Left voice message

## 2021-09-01 ENCOUNTER — Encounter: Payer: Self-pay | Admitting: General Practice

## 2021-09-01 ENCOUNTER — Other Ambulatory Visit (HOSPITAL_COMMUNITY): Payer: Self-pay

## 2021-09-01 NOTE — Telephone Encounter (Signed)
Transition Care Management Follow-up Telephone Call Date of discharge and from where: 08/29/2021 from [facility] How have you been since you were released from the hospital? Pt stated that he is feeling okay. Pt did not have any questions or concerns at this time.  Any questions or concerns? No  Items Reviewed: Did the pt receive and understand the discharge instructions provided? Yes  Medications obtained and verified? Yes  Other? No  Any new allergies since your discharge? No  Dietary orders reviewed? No Do you have support at home? Yes   Functional Questionnaire: (I = Independent and D = Dependent)  ADLs: I Bathing/Dressing- I Meal Prep- I Eating- I Maintaining continence- I Transferring/Ambulation- I Managing Meds- I  Follow up appointments reviewed:  PCP Hospital f/u appt confirmed? No   Specialist Hospital f/u appt confirmed? Yes Cardiology on 12/05 and Oncology on 12/07  Are transportation arrangements needed? No  If their condition worsens, is the pt aware to call PCP or go to the Emergency Dept.? Yes Was the patient provided with contact information for the PCP's office or ED? Yes Was to pt encouraged to call back with questions or concerns? Yes

## 2021-09-01 NOTE — Progress Notes (Signed)
[place] CSW Progress Notes  Called patient per referral from [place] for patient's anxiety.  He applied through [facility].  He has received his Medicaid.  He is getting behind on his bills.  His stress is financial "I am getting overwhelmed by bills."  Feels that his situation is not depression or anxiety.  He has contacted [employer] and requested help from emergency assistance fund.  He feels that the information required will be difficult to obtain, so he did not apply for this help.  He has applied for forbearance from his [place] - he will learn the bank's decision soon.  He has received help from DSS with an energy bill last month.  Advised that he can apply for funds at [place] from [employer] program - emailed him further information.  He can come to [place] at his next [facility] visit for [employer] and [employer] application. 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  Unfortunately, there are no other financial assistance options open to him.  He is doing what he can to follow up on his application for social Security disability.  He declined any suggestions for referrals to mental health providers under his Medicaid coverage, stating his problems were financial not mental health related.  Edwyna Shell, LCSW Clinical Social Worker Phone:  [phone]

## 2021-09-04 ENCOUNTER — Other Ambulatory Visit (HOSPITAL_COMMUNITY): Payer: Self-pay

## 2021-09-10 ENCOUNTER — Other Ambulatory Visit: Payer: Self-pay | Admitting: Hematology and Oncology

## 2021-09-10 ENCOUNTER — Other Ambulatory Visit (HOSPITAL_COMMUNITY): Payer: Self-pay

## 2021-09-10 DIAGNOSIS — N62 Hypertrophy of breast: Secondary | ICD-10-CM

## 2021-09-10 DIAGNOSIS — Z419 Encounter for procedure for purposes other than remedying health state, unspecified: Secondary | ICD-10-CM | POA: Diagnosis not present

## 2021-09-10 MED ORDER — OXYCODONE-ACETAMINOPHEN 5-325 MG PO TABS
1.0000 | ORAL_TABLET | Freq: Four times a day (QID) | ORAL | 0 refills | Status: DC | PRN
  Filled 2021-09-10 – 2021-09-15 (×2): qty 60, 15d supply, fill #0

## 2021-09-14 ENCOUNTER — Ambulatory Visit (HOSPITAL_BASED_OUTPATIENT_CLINIC_OR_DEPARTMENT_OTHER)
Admission: RE | Admit: 2021-09-14 | Discharge: 2021-09-14 | Disposition: A | Discharge status: Home / Self Care | Payer: Medicaid Other | Source: Ambulatory Visit | Attending: Internal Medicine | Admitting: Internal Medicine

## 2021-09-14 ENCOUNTER — Encounter (HOSPITAL_COMMUNITY): Payer: Self-pay | Admitting: Internal Medicine

## 2021-09-14 ENCOUNTER — Ambulatory Visit (HOSPITAL_COMMUNITY)
Admission: RE | Admit: 2021-09-14 | Discharge: 2021-09-14 | Disposition: A | Discharge status: Home / Self Care | Payer: Medicaid Other | Source: Ambulatory Visit | Attending: Family Medicine | Admitting: Family Medicine

## 2021-09-14 VITALS — BP 122/80 | HR 66 | Wt 155.8 lb

## 2021-09-14 DIAGNOSIS — I110 Hypertensive heart disease with heart failure: Secondary | ICD-10-CM | POA: Insufficient documentation

## 2021-09-14 DIAGNOSIS — C880 Waldenstrom macroglobulinemia: Secondary | ICD-10-CM | POA: Diagnosis not present

## 2021-09-14 DIAGNOSIS — I351 Nonrheumatic aortic (valve) insufficiency: Secondary | ICD-10-CM | POA: Diagnosis not present

## 2021-09-14 DIAGNOSIS — I5022 Chronic systolic (congestive) heart failure: Secondary | ICD-10-CM | POA: Diagnosis not present

## 2021-09-14 DIAGNOSIS — I340 Nonrheumatic mitral (valve) insufficiency: Secondary | ICD-10-CM | POA: Diagnosis not present

## 2021-09-14 LAB — ECHOCARDIOGRAM COMPLETE
Area-P 1/2: 3.63 cm2
Calc EF: 26.9 %
S' Lateral: 5.7 cm
Single Plane A2C EF: 27.3 %
Single Plane A4C EF: 22.9 %

## 2021-09-14 NOTE — Progress Notes (Signed)
Advanced Heart Failure Team Clinic Note PCP: Juluis Mire HF Cardiologist: Dr. Haroldine Laws  HPI:  Connor Solomon is a 60 y.o. WM w/ h/o HTN, new IDA and new systolic HF.   Pt had stress echo in 2010 for CP showing no stress arrhythmias or conduction abnormalities. The stress ECG was negative for ischemia. There was no echocardiographic evidence for stress-induced ischemia. EF normal. In 2011, he had syncope and evaluated by Dr. Harrington Challenger but this was felt to be vasovagal syncope. No further cardiac w/u.   Admitted 04/07/21 where he was found to be in acute CHF and 20 pound weight loss. CXR suspicious for bronchopneumonia. Also could not r/o underlying malignancy. CT + for moderate bilateral pleural effusions and patchy airspace opacities consistent with multifocal pneumonia. He was found to be anemic w/ hgb 8.6.   Echo showed severely reduced LVEF, 20-25% w/ glogal HK, Grade III DD (restrictive), RV normal. Mod-severe MR. No LVH. 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 ESR 142, autoimmune workup ensued. cMRI consistent with NICM, EF 25%.  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 Discharged on spiro, carvedilol and losartan (bp too soft for Entresto).  Seen by Dr. Lorenso Courier with Heme/Onc. 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 Started Ibrutinib + Rituximab 06/05/21.   Today he returns for HF follow up. Says he tries to stay active but remains very fatigued. Can do ADLs but gets fatigued with anything else. Says when he tries to do more he gets joint pain/swelling for several days. Has to use a walker sometimes. Gets winded doing housework. No edema, orthopnea or PND. Compliant with meds. BP has dropped a few times. Smoking 1 black & tan a few days a week.    Echo today (09/14/21): EF 25% RV ok Personally reviewed   Cardiac Studies:   -  R/LHC (6/22): Normal cors, severe MICM EF 25%, low filling pressures with normal CO.   Ao =98/68 (82) LV = 81/3 RA = 1 RV = 27/2 PA = 25/8 (16) PCW = 10 Fick cardiac output/index = 5.8/3.2 PVR = 1.0 WU Ao sat = 100% PA sat = 66%, 70%   - Echo (6/22): EF 20-25%, Grade III DD, RV ok, mod/severe MR  ROS: All systems negative except as listed in HPI, PMH and Problem List.  SH:  Social History   Socioeconomic History   Marital status: Single    Spouse name: Not on file   Number of children: Not on file   Years of education: Not on file   Highest education level: Not on file  Occupational History   Not on file  Tobacco Use   Smoking status: Former    Types: Cigars    Quit date: 10/11/2020    Years since quitting: 0.9   Smokeless tobacco: Never   Tobacco comments:    smokes 1 cigar a week  Substance and Sexual Activity   Alcohol use: No   Drug use: No   Sexual activity: Not on file  Other Topics Concern   Not on file  Social History Narrative   Not on file   Social Determinants of Health   Financial Resource Strain: High Risk   Difficulty of Paying Living Expenses: Very hard  Food Insecurity: Food Insecurity Present   Worried About Running Out of Food in the Last Year: Sometimes true   Ran Out of Food in the Last Year:  Sometimes true  Transportation Needs: No Transportation Needs   Lack of Transportation (Medical): No   Lack of Transportation (Non-Medical): No  Physical Activity: Not on file  Stress: Stress Concern Present   Feeling of Stress : Very much  Social Connections: Not on file  Intimate Partner Violence: Not on file   FH:  Family History  Problem Relation Age of Onset   Heart attack Father    Sudden Cardiac Death Father 39   Diabetes Neg Hx    Hyperlipidemia Neg Hx    Hypertension Neg Hx    Past Medical History:  Diagnosis Date   Acute heart failure ([place]) 04/08/2021   Acute HFrEF (heart failure with reduced ejection fraction) ([place]) 04/07/2021    Arthritis    hands, elbows bilaterally   Cancer ([place])    Full dentures    Hypertension    Pt states he doen not have HTN and has never been treated for HTN   Kidney stone on left side last stone 4-5 yrs ago   recurrent (3 episodes)   Panic disorder    pt states has resolved   Syncope    resolved- was related to panic attacks   Current Outpatient Medications  Medication Sig Dispense Refill   acetaminophen (TYLENOL) 500 MG tablet Take 500 mg by mouth every 6 (six) hours as needed.     albuterol (PROAIR HFA) 108 (90 Base) MCG/ACT inhaler Inhale 2 puffs into the lungs every 6 (six) hours as needed for wheezing or shortness of breath. 8.5 g 2   allopurinol (ZYLOPRIM) 300 MG tablet Take 1 tablet (300 mg total) by mouth daily. 90 tablet 1   carvedilol (COREG) 3.125 MG tablet Take 1 tablet (3.125 mg total) by mouth 2 (two) times daily. 68 tablet 2   empagliflozin (JARDIANCE) 10 MG TABS tablet Take 1 tablet (10 mg total) by mouth daily before breakfast. 30 tablet 11   eplerenone (INSPRA) 25 MG tablet Take 1/2 tablet (12.5 mg total) by mouth daily. 15 tablet 11   furosemide (LASIX) 20 MG tablet Take 1 tablet (20 mg total) by mouth daily as needed for swelling 100 tablet 2   ibrutinib 420 MG TABS Take 420 mg by mouth daily. 30 tablet 2   OLANZapine (ZYPREXA) 5 MG tablet Take 1 tablet (5 mg total) by mouth at bedtime. 30 tablet 3   ondansetron (ZOFRAN) 8 MG tablet Take 1 tablet by mouth every 8 hours as needed. 30 tablet 0   oxyCODONE-acetaminophen (PERCOCET) 5-325 MG tablet Take 1 tablet by mouth every 6 hours as needed for severe pain. 60 tablet 0   sacubitril-valsartan (ENTRESTO) 24-26 MG Take 1 tablet by mouth 2 (two) times daily. 60 tablet 5   No current facility-administered medications for this encounter.   BP 122/80   Pulse 66   Wt 70.7 kg (155 lb 12.8 oz)   SpO2 99%   BMI 23.69 kg/m   Wt Readings from Last 3 Encounters:  09/14/21 70.7 kg (155 lb 12.8 oz)  08/29/21 68 kg (150  lb)  08/18/21 68.7 kg (151 lb 8 oz)   PHYSICAL EXAM: General:  Well appearing. No resp difficulty HEENT: normal Neck: supple. no JVD. Carotids 2+ bilat; no bruits. No lymphadenopathy or thryomegaly appreciated. Cor: PMI nondisplaced. Regular rate & rhythm. No rubs, gallops or murmurs. Lungs: clear Abdomen: soft, nontender, nondistended. No hepatosplenomegaly. No bruits or masses. Good bowel sounds. Extremities: no cyanosis, clubbing, rash, edema Neuro: alert & orientedx3, cranial nerves grossly intact.  moves all 4 extremities w/o difficulty. Affect pleasant   ASSESSMENT & PLAN:   1. Chronic Systolic Heart Failure - Stress Echo 2010: normal EF. No evidence of ischemia - Echo 6/22: EF 20-25%, GIIDD (restrictive), no LVH. RV normal - ? Viral CM (COVID + 2021), ? Familia (FH of SCD). TSH normal, HIV pending - LHC/RHC (6/22): with normal cors, low filling pressures, and normal cardiac output - Also ? Infiltrative CM. cMRI EF 25%, normal RV, Basal septal midwall LGE, mild MR.  - MM panel urine immunofixation: IgM monoclonal protein with kappa light chain  Specificity -> found to have Waldstrom's Macroglobulinemia - Echo today 09/14/21 EF 25% mild MR/AI  - Stable NYHA III,. Volume status ok. Takes lasix as needed. Unclear if majority of symptoms related to HF versus his malignancy (suspect the latter) - Continue eplerenone 12.5 mg daily (gynecomastia with spiro).  - Continue Entresto 24/26 mg bid.  - Continue carvedilol 3.125 mg bid. - Continue Jardiance 10 mg daily.  - BP too low to titrate GDMT  2.  Mitral Regurgitation - Mod-severe, likely functional from dilated LV. - Mild on echo today        3.  Waldenstrom's macroglobulinemia. - Now followed by Dr. Lorenso Courier with Heme/Onc - Completed 4 weeks of Rituximab. Now continued on daily ibrutinib.  4. SDOH - He now has Medicaid and disability is pending.  Glori Bickers, MD  12:16 PM

## 2021-09-14 NOTE — Patient Instructions (Signed)
Medication Changes:  No change  Lab Work:  No labs needed  Testing/Procedures:  none  Referrals:  none  Special Instructions // Education:  none  Follow-Up in: 3 months  At the [facility], you and your health needs are our priority. We have a designated team specialized in the treatment of Heart Failure. This Care Team includes your primary Heart Failure Specialized Cardiologist (physician), Advanced Practice Providers (APPs- Physician Assistants and Nurse Practitioners), and Pharmacist who all work together to provide you with the care you need, when you need it.   You may see any of the following providers on your designated Care Team at your next follow up:  Dr Glori Bickers Dr Haynes Kerns, NP Lyda Jester, [place] [facility] [place], [place] Audry Riles, PharmD   Please be sure to bring in all your medications bottles to every appointment.   Need to Contact us:  If you have any questions or concerns before your next appointment please send [place] a message through [place] or call our office at [phone].    TO LEAVE A MESSAGE FOR THE NURSE SELECT OPTION 2, PLEASE LEAVE A MESSAGE INCLUDING: YOUR NAME DATE OF BIRTH CALL BACK NUMBER REASON FOR CALL**this is important as we prioritize the call backs  YOU WILL RECEIVE A CALL BACK THE SAME DAY AS LONG AS YOU CALL BEFORE 4:00 PM

## 2021-09-15 ENCOUNTER — Other Ambulatory Visit: Payer: Self-pay | Admitting: Physician Assistant

## 2021-09-15 ENCOUNTER — Other Ambulatory Visit (HOSPITAL_COMMUNITY): Payer: Medicaid Other

## 2021-09-15 ENCOUNTER — Other Ambulatory Visit (HOSPITAL_COMMUNITY): Payer: Self-pay

## 2021-09-15 DIAGNOSIS — C880 Waldenstrom macroglobulinemia: Secondary | ICD-10-CM

## 2021-09-16 ENCOUNTER — Other Ambulatory Visit: Payer: Self-pay

## 2021-09-16 ENCOUNTER — Inpatient Hospital Stay: Payer: Medicaid Other | Attending: Hematology and Oncology

## 2021-09-16 ENCOUNTER — Inpatient Hospital Stay: Payer: Medicaid Other | Admitting: General Practice

## 2021-09-16 ENCOUNTER — Inpatient Hospital Stay (HOSPITAL_BASED_OUTPATIENT_CLINIC_OR_DEPARTMENT_OTHER): Payer: Medicaid Other | Admitting: Physician Assistant

## 2021-09-16 VITALS — BP 119/76 | HR 66 | Temp 97.7°F | Resp 17 | Ht 68.0 in | Wt 154.4 lb

## 2021-09-16 DIAGNOSIS — R197 Diarrhea, unspecified: Secondary | ICD-10-CM | POA: Diagnosis not present

## 2021-09-16 DIAGNOSIS — M2550 Pain in unspecified joint: Secondary | ICD-10-CM | POA: Diagnosis not present

## 2021-09-16 DIAGNOSIS — D500 Iron deficiency anemia secondary to blood loss (chronic): Secondary | ICD-10-CM

## 2021-09-16 DIAGNOSIS — R112 Nausea with vomiting, unspecified: Secondary | ICD-10-CM

## 2021-09-16 DIAGNOSIS — Z5941 Food insecurity: Secondary | ICD-10-CM | POA: Diagnosis not present

## 2021-09-16 DIAGNOSIS — D649 Anemia, unspecified: Secondary | ICD-10-CM | POA: Diagnosis not present

## 2021-09-16 DIAGNOSIS — Z596 Low income: Secondary | ICD-10-CM | POA: Diagnosis not present

## 2021-09-16 DIAGNOSIS — I110 Hypertensive heart disease with heart failure: Secondary | ICD-10-CM | POA: Diagnosis not present

## 2021-09-16 DIAGNOSIS — Z79620 Long term (current) use of immunosuppressive biologic: Secondary | ICD-10-CM | POA: Insufficient documentation

## 2021-09-16 DIAGNOSIS — K5900 Constipation, unspecified: Secondary | ICD-10-CM | POA: Diagnosis not present

## 2021-09-16 DIAGNOSIS — Z79899 Other long term (current) drug therapy: Secondary | ICD-10-CM | POA: Diagnosis not present

## 2021-09-16 DIAGNOSIS — C880 Waldenstrom macroglobulinemia: Secondary | ICD-10-CM | POA: Insufficient documentation

## 2021-09-16 DIAGNOSIS — R590 Localized enlarged lymph nodes: Secondary | ICD-10-CM | POA: Diagnosis not present

## 2021-09-16 DIAGNOSIS — Z885 Allergy status to narcotic agent status: Secondary | ICD-10-CM | POA: Diagnosis not present

## 2021-09-16 DIAGNOSIS — D472 Monoclonal gammopathy: Secondary | ICD-10-CM | POA: Diagnosis not present

## 2021-09-16 DIAGNOSIS — Z87891 Personal history of nicotine dependence: Secondary | ICD-10-CM | POA: Insufficient documentation

## 2021-09-16 DIAGNOSIS — I5022 Chronic systolic (congestive) heart failure: Secondary | ICD-10-CM | POA: Insufficient documentation

## 2021-09-16 DIAGNOSIS — Z8249 Family history of ischemic heart disease and other diseases of the circulatory system: Secondary | ICD-10-CM | POA: Insufficient documentation

## 2021-09-16 DIAGNOSIS — R0602 Shortness of breath: Secondary | ICD-10-CM | POA: Diagnosis not present

## 2021-09-16 LAB — CMP (CANCER CENTER ONLY)
ALT: 12 U/L (ref 0–44)
AST: 14 U/L — ABNORMAL LOW (ref 15–41)
Albumin: 3.8 g/dL (ref 3.5–5.0)
Alkaline Phosphatase: 91 U/L (ref 38–126)
Anion gap: 8 (ref 5–15)
BUN: 10 mg/dL (ref 6–20)
CO2: 23 mmol/L (ref 22–32)
Calcium: 8.6 mg/dL — ABNORMAL LOW (ref 8.9–10.3)
Chloride: 105 mmol/L (ref 98–111)
Creatinine: 0.89 mg/dL (ref 0.61–1.24)
GFR, Estimated: >60 mL/min (ref >60)
Glucose, Bld: 93 mg/dL (ref 70–99)
Potassium: 3.7 mmol/L (ref 3.5–5.1)
Sodium: 136 mmol/L (ref 135–145)
Total Bilirubin: 0.6 mg/dL (ref 0.3–1.2)
Total Protein: 6.2 g/dL — ABNORMAL LOW (ref 6.5–8.1)

## 2021-09-16 LAB — CBC WITH DIFFERENTIAL (CANCER CENTER ONLY)
Abs Immature Granulocytes: 0.02 10*3/uL (ref 0.00–0.07)
Basophils Absolute: 0.1 10*3/uL (ref 0.0–0.1)
Basophils Relative: 1 %
Eosinophils Absolute: 0.6 10*3/uL — ABNORMAL HIGH (ref 0.0–0.5)
Eosinophils Relative: 10 %
HCT: 35.4 % — ABNORMAL LOW (ref 39.0–52.0)
Hemoglobin: 11.6 g/dL — ABNORMAL LOW (ref 13.0–17.0)
Immature Granulocytes: 0 %
Lymphocytes Relative: 33 %
Lymphs Abs: 2.1 10*3/uL (ref 0.7–4.0)
MCH: 26.6 pg (ref 26.0–34.0)
MCHC: 32.8 g/dL (ref 30.0–36.0)
MCV: 81.2 fL (ref 80.0–100.0)
Monocytes Absolute: 0.6 10*3/uL (ref 0.1–1.0)
Monocytes Relative: 8 %
Neutro Abs: 3.2 10*3/uL (ref 1.7–7.7)
Neutrophils Relative %: 48 %
Platelet Count: 328 10*3/uL (ref 150–400)
RBC: 4.36 MIL/uL (ref 4.22–5.81)
RDW: 15.4 % (ref 11.5–15.5)
WBC Count: 6.5 10*3/uL (ref 4.0–10.5)
nRBC: 0 % (ref 0.0–0.2)

## 2021-09-16 LAB — LACTATE DEHYDROGENASE: LDH: 104 U/L (ref 98–192)

## 2021-09-16 NOTE — Progress Notes (Signed)
[place] [phone]   [phone]  PROGRESS NOTE  Patient Care Team: Delene Ruffini, MD as PCP - General (Internal Medicine)  Hematological/Oncological History # Waldenstrom's Macroglobulinemia # IgM Monoclonal Gammopathy 04/07/2021: CT A/p showed mildly enlarged retroperitoneal lymph nodes and bilateral inguinal lymph nodes.  04/08/2021: IgM Kappa M protein 1.2 04/09/2021: WBC 11.1, Hgb 9.5, MCV 75.4, Plt 525 04/20/2021: Kappa 977, Lambda 7.8, K/L ratio 125.28. Serum viscosity 1.8 05/06/2021: Establish care with Dr. 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 Infusion reaction with ritux, held halfway through.  06/12/2021: Cycle 2 Day 1 of Ibrutinib + Rituximab 06/19/2021: Cycle 3 Day 1 of Ibrutinib + Rituximab 06/26/2021: completed 4 weeks of rituximab. Continued on daily ibrutinib   Interval History:  ASENCION Solomon 60 y.o. male with medical history significant for Waldenstrom's macroglobulinemia who presents for a follow up visit. The patient's last visit was on 08/18/2021. In the interim since the last visit he has continued on ibrutinib therapy.  On exam today [name] reports that he tries to stay active but admits he does have days when he has diffuse joint pain.  He reports that he needs to use a walker when his joint pain is severe.  This occurs once every 2 to 3 weeks.  Patient reports having a good appetite but continues to have intermittent episodes of nausea and vomiting.  He switched from Zofran to olanzapine last month but discontinued olanzapine as he reports "not feeling okay on it".  He is back on Zofran which she takes as needed.  His bowel habits vary from constipation to diarrhea.  He takes over-the-counter supportive medication to manage his symptoms.   He reports he has a daily bowel movement.  He denies easy bruising or signs of bleeding.  He reports stable shortness of breath mainly with exertion.  He denies fevers, chills, night sweats, chest pain, headache or vision changes. Full 10 point ROS is listed below.  MEDICAL HISTORY:  Past Medical History:  Diagnosis Date   Acute heart failure ([place]) 04/08/2021   Acute HFrEF (heart failure with reduced ejection fraction) ([place]) 04/07/2021   Arthritis    hands, elbows bilaterally   Cancer (H[facility])    Full dentures    Hypertension    Pt states he doen not have HTN and has never been treated for HTN   Kidney stone on left side last stone 4-5 yrs ago   recurrent (3 episodes)   Panic disorder    pt states has resolved   Syncope    resolved- was related to panic attacks    SURGICAL HISTORY: Past Surgical History:  Procedure Laterality Date   CARPAL METACARPAL FUSION WITH DISTAL RADIAL BONE GRAFT Left 05/30/2013   Procedure: LEFT THUMB METACARPAL JOINT FUSION;  Surgeon: Schuyler Amor, MD;  Location: [place];  Service: Orthopedics;  Laterality: Left;   ESOPHAGOGASTRODUODENOSCOPY N/A 02/12/2014   Procedure: ESOPHAGOGASTRODUODENOSCOPY (EGD);  Surgeon: Jerene Bears, MD;  Location: [facility] ENDOSCOPY;  Service: Endoscopy;  Laterality: N/A;   FOREIGN BODY REMOVAL N/A 02/12/2014   Procedure: FOREIGN BODY REMOVAL;  Surgeon: Jerene Bears, MD;  Location: [place];  Service: Endoscopy;  Laterality: N/A;   OPEN REDUCTION INTERNAL FIXATION (ORIF) DISTAL RADIAL FRACTURE Left 11/29/2012   Procedure: OPEN REDUCTION INTERNAL FIXATION (ORIF) DISTAL RADIAL FRACTURE;  Surgeon: Schuyler Amor,  MD;  Location: [place];  Service: Orthopedics;  Laterality: Left;  LEFT DISTAL RADIUS OSTEOTOMY WITH BONE GRAFT   RIGHT/LEFT HEART CATH AND CORONARY ANGIOGRAPHY N/A 04/09/2021   Procedure: RIGHT/LEFT HEART CATH AND CORONARY ANGIOGRAPHY;  Surgeon: Jolaine Artist, MD;  Location: [place] [facility];  Service: Cardiovascular;  Laterality: N/A;   TENDON REPAIR Left 02/07/2013   Procedure: LEFT EXTENSOR INDICIS PROPRIUS  TO EXTENSOR POLLICIS LONGUS TENDON TRANSFER;  Surgeon: Schuyler Amor, MD;  Location: [place];  Service: Orthopedics;  Laterality: Left;   WISDOM TOOTH EXTRACTION     WRIST SURGERY     fx only    SOCIAL HISTORY: Social History   Socioeconomic History   Marital status: Single    Spouse name: Not on file   Number of children: Not on file   Years of education: Not on file   Highest education level: Not on file  Occupational History   Not on file  Tobacco Use   Smoking status: Former    Types: Cigars    Quit date: 10/11/2020    Years since quitting: 0.9   Smokeless tobacco: Never   Tobacco comments:    smokes 1 cigar a week  Substance and Sexual Activity   Alcohol use: No   Drug use: No   Sexual activity: Not on file  Other Topics Concern   Not on file  Social History Narrative   Not on file   Social Determinants of Health   Financial Resource Strain: High Risk   Difficulty of Paying Living Expenses: Very hard  Food Insecurity: Food Insecurity Present   Worried About [place] in the Last Year: Sometimes true   Ran Out of Food in the Last Year: Sometimes true  Transportation Needs: No Transportation Needs   Lack of Transportation (Medical): No   Lack of Transportation (Non-Medical): No  Physical Activity: Not on file  Stress: Stress Concern Present   Feeling of Stress : Very much  Social Connections: Not on file  Intimate Partner Violence: Not on file    FAMILY HISTORY: Family History  Problem Relation Age of Onset   Heart attack Father    Sudden Cardiac Death Father 46   Diabetes Neg Hx    Hyperlipidemia Neg Hx    Hypertension Neg Hx     ALLERGIES:  is allergic to rituxan [rituximab] and hydrocodone.  MEDICATIONS:  Current Outpatient Medications  Medication Sig Dispense Refill    acetaminophen (TYLENOL) 500 MG tablet Take 500 mg by mouth every 6 (six) hours as needed.     albuterol (PROAIR HFA) 108 (90 Base) MCG/ACT inhaler Inhale 2 puffs into the lungs every 6 (six) hours as needed for wheezing or shortness of breath. 8.5 g 2   allopurinol (ZYLOPRIM) 300 MG tablet Take 1 tablet (300 mg total) by mouth daily. 90 tablet 1   carvedilol (COREG) 3.125 MG tablet Take 1 tablet (3.125 mg total) by mouth 2 (two) times daily. 68 tablet 2   empagliflozin (JARDIANCE) 10 MG TABS tablet Take 1 tablet (10 mg total) by mouth daily before breakfast. 30 tablet 11   eplerenone (INSPRA) 25 MG tablet Take 1/2 tablet (12.5 mg total) by mouth daily. 15 tablet 11   furosemide (LASIX) 20 MG tablet Take 1 tablet (20 mg total) by mouth daily as needed for swelling 100 tablet 2   ibrutinib 420 MG TABS Take 420 mg by mouth daily. 30 tablet 2  OLANZapine (ZYPREXA) 5 MG tablet Take 1 tablet (5 mg total) by mouth at bedtime. 30 tablet 3   ondansetron (ZOFRAN) 8 MG tablet Take 1 tablet by mouth every 8 hours as needed. 30 tablet 0   oxyCODONE-acetaminophen (PERCOCET) 5-325 MG tablet Take 1 tablet by mouth every 6 hours as needed for severe pain. 60 tablet 0   sacubitril-valsartan (ENTRESTO) 24-26 MG Take 1 tablet by mouth 2 (two) times daily. 60 tablet 5   No current facility-administered medications for this visit.    REVIEW OF SYSTEMS:   Constitutional: ( - ) fevers, ( - )  chills , ( - ) night sweats Eyes: ( - ) blurriness of vision, ( - ) double vision, ( - ) watery eyes Ears, nose, mouth, throat, and face: ( - ) mucositis, ( - ) sore throat Respiratory: ( - ) cough, ( - ) dyspnea, ( - ) wheezes Cardiovascular: ( - ) palpitation, ( - ) chest discomfort, ( - ) lower extremity swelling Gastrointestinal:  ( - ) nausea, ( - ) heartburn, ( - ) change in bowel habits Skin: ( - ) abnormal skin rashes Lymphatics: ( - ) new lymphadenopathy, ( - ) easy bruising Neurological: ( - ) numbness, ( - )  tingling, ( - ) new weaknesses Behavioral/Psych: ( - ) mood change, ( - ) new changes  All other systems were reviewed with the patient and are negative.  PHYSICAL EXAMINATION: ECOG PERFORMANCE STATUS: 1 - Symptomatic but completely ambulatory  Vitals:   09/16/21 1423  BP: 119/76  Pulse: 66  Resp: 17  Temp: 97.7 F (36.5 C)  SpO2: 100%    Filed Weights   09/16/21 1423  Weight: 154 lb 6.4 oz (70 kg)     GENERAL: Well-appearing middle-aged Caucasian male, alert, no distress and comfortable SKIN: skin color, texture, turgor are normal, no rashes or significant lesions EYES: conjunctiva are pink and non-injected, sclera clear LUNGS: clear to auscultation and percussion with normal breathing effort HEART: regular rate & rhythm and no murmurs and no lower extremity edema  PSYCH: alert & oriented x 3, fluent speech NEURO: no focal motor/sensory deficits  LABORATORY DATA:  I have reviewed the data as listed CBC Latest Ref Rng & Units 09/16/2021 08/18/2021 07/17/2021  WBC 4.0 - 10.5 K/uL 6.5 7.8 6.3  Hemoglobin 13.0 - 17.0 g/dL 11.6(L) 12.5(L) 10.5(L)  Hematocrit 39.0 - 52.0 % 35.4(L) 39.2 33.4(L)  Platelets 150 - 400 K/uL 328 261 315    CMP Latest Ref Rng & Units 09/16/2021 08/18/2021 08/18/2021  Glucose 70 - 99 mg/dL 93 91 107(H)  BUN 6 - 20 mg/dL $Remove'10 11 11  'nryhvbx$ Creatinine 0.61 - 1.24 mg/dL 0.89 0.87 0.90  Sodium 135 - 145 mmol/L 136 133(L) 135  Potassium 3.5 - 5.1 mmol/L 3.7 4.2 4.0  Chloride 98 - 111 mmol/L 105 99 101  CO2 22 - 32 mmol/L $RemoveB'23 24 24  'arwWUGPS$ Calcium 8.9 - 10.3 mg/dL 8.6(L) 9.0 8.8(L)  Total Protein 6.5 - 8.1 g/dL 6.2(L) - 6.2(L)  Total Bilirubin 0.3 - 1.2 mg/dL 0.6 - 0.6  Alkaline Phos 38 - 126 U/L 91 - 95  AST 15 - 41 U/L 14(L) - 14(L)  ALT 0 - 44 U/L 12 - 8    Lab Results  Component Value Date   MPROTEIN 0.2 (H) 08/18/2021   MPROTEIN 1.2 (H) 04/08/2021   Lab Results  Component Value Date   KPAFRELGTCHN 43.1 (H) 08/18/2021   LAMBDASER 5.8 08/18/2021  KAPLAMBRATIO 7.43 (H) 08/18/2021   KAPLAMBRATIO 136.50 (H) 07/09/2021   KAPLAMBRATIO 875.18 (H) 05/12/2021    RADIOGRAPHIC STUDIES: ECHOCARDIOGRAM COMPLETE  Result Date: 09/14/2021    ECHOCARDIOGRAM REPORT   Patient Name:   MARKEL KURTENBACH Date of Exam: 09/14/2021 Medical Rec #:  [id number]     Height:       68.0 in Accession #:    [id number]    Weight:       150.0 lb Date of Birth:  [birth year]     BSA:          1.809 m Patient Age:    60 years      BP:           122/84 mmHg Patient Gender: M             HR:           70 bpm. Exam Location:  Outpatient Procedure: 2D Echo Indications:    Congestive Heart Failure  History:        Patient has prior history of Echocardiogram examinations, most                 recent 04/07/2021. Risk Factors:Hypertension.  Sonographer:    Thurman Coyer RDCS Referring Phys: [phone] JESSICA M [place] IMPRESSIONS  1. Left ventricular ejection fraction, by estimation, is 25%. The left ventricle has normal function. The left ventricle demonstrates global hypokinesis. The left ventricular internal cavity size was moderately dilated. Left ventricular diastolic parameters are consistent with Grade I diastolic dysfunction (impaired relaxation).  2. Right ventricular systolic function is normal. The right ventricular size is normal.  3. Left atrial size was moderately dilated.  4. Right atrial size was mildly dilated.  5. The mitral valve is normal in structure. Mild mitral valve regurgitation. No evidence of mitral stenosis.  6. The aortic valve is tricuspid. Aortic valve regurgitation is mild. No aortic stenosis is present.  7. Aortic dilatation noted. There is borderline dilatation of the ascending aorta, measuring 38 mm.  8. The inferior vena cava is normal in size with greater than 50% respiratory variability, suggesting right atrial pressure of 3 mmHg. FINDINGS  Left Ventricle: Left ventricular ejection fraction, by estimation, is 25%. The left ventricle has normal function. The left  ventricle demonstrates global hypokinesis. The left ventricular internal cavity size was moderately dilated. There is no left ventricular hypertrophy. Left ventricular diastolic parameters are consistent with Grade I diastolic dysfunction (impaired relaxation). Right Ventricle: The right ventricular size is normal. No increase in right ventricular wall thickness. Right ventricular systolic function is normal. Left Atrium: Left atrial size was moderately dilated. Right Atrium: Right atrial size was mildly dilated. Pericardium: There is no evidence of pericardial effusion. Mitral Valve: The mitral valve is normal in structure. Mild mitral valve regurgitation. No evidence of mitral valve stenosis. Tricuspid Valve: The tricuspid valve is normal in structure. Tricuspid valve regurgitation is trivial. No evidence of tricuspid stenosis. Aortic Valve: The aortic valve is tricuspid. Aortic valve regurgitation is mild. No aortic stenosis is present. Pulmonic Valve: The pulmonic valve was not well visualized. Pulmonic valve regurgitation is not visualized. No evidence of pulmonic stenosis. Aorta: The aortic root is normal in size and structure and aortic dilatation noted. There is borderline dilatation of the ascending aorta, measuring 38 mm. Venous: The inferior vena cava is normal in size with greater than 50% respiratory variability, suggesting right atrial pressure of 3 mmHg. IAS/Shunts: No atrial level shunt detected by color flow Doppler.  LEFT VENTRICLE PLAX 2D LVIDd:         6.30 cm      Diastology LVIDs:         5.70 cm      LV e' medial:    4.91 cm/s LV PW:         0.80 cm      LV E/e' medial:  13.0 LV IVS:        0.70 cm      LV e' lateral:   7.01 cm/s LVOT diam:     2.10 cm      LV E/e' lateral: 9.1 LV SV:         56 LV SV Index:   31 LVOT Area:     3.46 cm  LV Volumes (MOD) LV vol d, MOD A2C: 154.0 ml LV vol d, MOD A4C: 166.0 ml LV vol s, MOD A2C: 112.0 ml LV vol s, MOD A4C: 128.0 ml LV SV MOD A2C:     42.0 ml LV  SV MOD A4C:     166.0 ml LV SV MOD BP:      43.3 ml RIGHT VENTRICLE RV S prime:     11.00 cm/s TAPSE (M-mode): 2.0 cm LEFT ATRIUM             Index        RIGHT ATRIUM           Index LA diam:        4.00 cm 2.21 cm/m   RA Area:     17.30 cm LA Vol (A2C):   77.5 ml 42.85 ml/m  RA Volume:   49.10 ml  27.15 ml/m LA Vol (A4C):   45.3 ml 25.05 ml/m LA Biplane Vol: 59.1 ml 32.68 ml/m  AORTIC VALVE LVOT Vmax:   85.50 cm/s LVOT Vmean:  54.800 cm/s LVOT VTI:    0.163 m  AORTA Ao Root diam: 3.70 cm MITRAL VALVE MV Area (PHT): 3.63 cm    SHUNTS MV Decel Time: 209 msec    Systemic VTI:  0.16 m MV E velocity: 63.70 cm/s  Systemic Diam: 2.10 cm MV A velocity: 89.20 cm/s MV E/A ratio:  0.71 Glori Bickers MD Electronically signed by Glori Bickers MD Signature Date/Time: 09/14/2021/12:08:32 PM    Final     ASSESSMENT & PLAN [name] 60 y.o. male with medical history significant for Waldenstrom's macroglobulinemia who presents for a follow up visit.   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  There is no clear evidence of hyperviscosity syndrome at this time.  At this time we will plan to proceed with ibrutinib and rituximab therapy.  One could also consider Bendamustine and rituximab therapy but given the patient's degree of anemia I would prefer pursuing ibrutinib and rituximab at this time.  Additionally ibrutinib/rituximab are category 1 recommendations per the NCCN guidelines.  The treatment will consist of ibrutinib 420 mg p.o. daily with rituximab on weeks 1 through 4 as well as weeks 27 through 30.  Previously we discussed the risks and benefits of therapy including (but not limited to) risk of bleeding, fatigue, atypical infections, and cardiac arrhythmias.  The patient voices understanding of this plan moving forward.  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  This is confirmed with an IgM monoclonal gammopathy  and bone marrow biopsy results showing a lymphoplasmacytic lymphoma --No clear signs of hyperviscosity syndrome at this time. --will order monthly SPEP, SFLC, and UPEP --labs today were reviewed without any intervention needed. Pending SPEP and sFLC levels.  --plan for next round of rituximab to start on Dec 04, 2021  --RTC in 4 weeks to assure he is improving  # Shortness of Breath/Cough #Concern for Pneumonia --improved from prior and stable from the last visit.  --continue to monitor.   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  -- At this time the patient's hemoglobin is most consistent with anemia due to lymphomatous involvement of bone marrow --There does appear to be a component of iron deficiency anemia as well.  He is not taking oral iron at this time. We will repeat oral iron levels in 4 weeks at next visit.  --Continue to monitor.  #Nausea and vomiting --Currently taking zofran PRN.  --Unable to tolerate olanzopine so discontinued --Advised to take zofran scheduled q 8 hours to minimize episodes.   #Supportive Care -- chemotherapy education complete -- port placement not required -- allopurinol $RemoveBefore'300mg'[id number]$  PO daily for TLS prophylaxis   Orders Placed This Encounter  Procedures   Iron and TIBC    Standing Status:   Future    Standing Expiration Date:   09/16/2022   Ferritin    Standing Status:   Future    Standing Expiration Date:   09/16/2022     All questions were answered. The patient knows to call the clinic with any problems, questions or concerns.  I have spent a total of 25 minutes minutes of face-to-face and non-face-to-face time, preparing to see the patient, obtaining and/or reviewing separately obtained history, performing a medically  appropriate examination, counseling and educating the patient, documenting clinical information in the electronic health record, and care coordination.    Lincoln Brigham, PA-C Department of Hematology/Oncology [facility] Phone: [phone]   09/16/2021 3:25 PM  Dimopoulos MA, Rennie Natter, Lonzo Cloud, Mahe B, Herbaux C, Tam C, Orsucci L, Palomba ML, Matous JV, Shustik C, Kastritis E, Byng, Li J, Salman Z, Graef T, Buske C; iNNOVATE Study Group and the [employer] for [employer]. Phase 3 Trial of Ibrutinib plus Rituximab in Waldenstrm's Macroglobulinemia. Alison Stalling J Med. 2018 Jun 21;378(25):2399-2410.   --At 30 months, the progression-free survival rate was 82% with ibrutinib-rituximab versus 28% with placebo-rituximab (hazard ratio for progression or death, 0.20; P<0.001).

## 2021-09-17 ENCOUNTER — Encounter: Payer: Self-pay | Admitting: Hematology and Oncology

## 2021-09-17 DIAGNOSIS — R112 Nausea with vomiting, unspecified: Secondary | ICD-10-CM | POA: Insufficient documentation

## 2021-09-17 LAB — KAPPA/LAMBDA LIGHT CHAINS
Kappa free light chain: 29.1 mg/L — ABNORMAL HIGH (ref 3.3–19.4)
Kappa, lambda light chain ratio: 7.66 — ABNORMAL HIGH (ref 0.26–1.65)
Lambda free light chains: 3.8 mg/L — ABNORMAL LOW (ref 5.7–26.3)

## 2021-09-21 LAB — MULTIPLE MYELOMA PANEL, SERUM
Albumin SerPl Elph-Mcnc: 3.4 g/dL (ref 2.9–4.4)
Albumin/Glob SerPl: 1.8 — ABNORMAL HIGH (ref 0.7–1.7)
Alpha 1: 0.2 g/dL (ref 0.0–0.4)
Alpha2 Glob SerPl Elph-Mcnc: 0.7 g/dL (ref 0.4–1.0)
B-Globulin SerPl Elph-Mcnc: 0.8 g/dL (ref 0.7–1.3)
Gamma Glob SerPl Elph-Mcnc: 0.3 g/dL — ABNORMAL LOW (ref 0.4–1.8)
Globulin, Total: 2 g/dL — ABNORMAL LOW (ref 2.2–3.9)
IgA: 10 mg/dL — ABNORMAL LOW (ref 90–386)
IgG (Immunoglobin G), Serum: 248 mg/dL — ABNORMAL LOW (ref 603–1613)
IgM (Immunoglobulin M), Srm: 179 mg/dL — ABNORMAL HIGH (ref 20–172)
M Protein SerPl Elph-Mcnc: 0.2 g/dL — ABNORMAL HIGH
Total Protein ELP: 5.4 g/dL — ABNORMAL LOW (ref 6.0–8.5)

## 2021-09-23 ENCOUNTER — Other Ambulatory Visit (HOSPITAL_COMMUNITY): Payer: Self-pay

## 2021-09-24 ENCOUNTER — Other Ambulatory Visit (HOSPITAL_COMMUNITY): Payer: Self-pay

## 2021-09-25 ENCOUNTER — Other Ambulatory Visit (HOSPITAL_COMMUNITY): Payer: Self-pay

## 2021-09-26 ENCOUNTER — Other Ambulatory Visit: Payer: Self-pay | Admitting: Hematology and Oncology

## 2021-09-26 DIAGNOSIS — N62 Hypertrophy of breast: Secondary | ICD-10-CM

## 2021-09-26 MED ORDER — OXYCODONE-ACETAMINOPHEN 5-325 MG PO TABS
1.0000 | ORAL_TABLET | Freq: Four times a day (QID) | ORAL | 0 refills | Status: DC | PRN
  Filled 2021-09-26: qty 60, 15d supply, fill #0

## 2021-09-28 ENCOUNTER — Other Ambulatory Visit (HOSPITAL_COMMUNITY): Payer: Self-pay

## 2021-09-30 ENCOUNTER — Telehealth: Payer: Self-pay | Admitting: General Practice

## 2021-09-30 NOTE — Telephone Encounter (Signed)
[name] CSW Progress Notes  Call returned to patient per his VM. No answer at either number provided, left VM w my contact information and encouragement to return my call.  Edwyna Shell, LCSW Clinical Social Worker Phone:  [phone]

## 2021-09-30 NOTE — Telephone Encounter (Signed)
[place] CSW Progress Notes  Returned call to patient, he is inquiring about [employer] cards for food/gas.  Continues to struggle w financial distress, has not been approved for disability to date.  He will call when he is near [facility] - next appointment here is 1/11, he would like to receive card prior to that time.  Edwyna Shell, LCSW Clinical Social Worker Phone:  [phone]

## 2021-10-07 ENCOUNTER — Encounter: Payer: Self-pay | Admitting: Hematology and Oncology

## 2021-10-07 ENCOUNTER — Other Ambulatory Visit: Payer: Self-pay | Admitting: Hematology and Oncology

## 2021-10-07 ENCOUNTER — Other Ambulatory Visit (HOSPITAL_COMMUNITY): Payer: Self-pay

## 2021-10-07 DIAGNOSIS — N62 Hypertrophy of breast: Secondary | ICD-10-CM

## 2021-10-07 MED ORDER — ONDANSETRON HCL 8 MG PO TABS
8.0000 mg | ORAL_TABLET | Freq: Three times a day (TID) | ORAL | 0 refills | Status: DC | PRN
  Filled 2021-10-07: qty 30, 10d supply, fill #0

## 2021-10-07 MED ORDER — OXYCODONE-ACETAMINOPHEN 5-325 MG PO TABS
1.0000 | ORAL_TABLET | Freq: Four times a day (QID) | ORAL | 0 refills | Status: DC | PRN
  Filled 2021-10-07: qty 60, 15d supply, fill #0

## 2021-10-09 ENCOUNTER — Other Ambulatory Visit (HOSPITAL_COMMUNITY): Payer: Self-pay

## 2021-10-11 DIAGNOSIS — Z419 Encounter for procedure for purposes other than remedying health state, unspecified: Secondary | ICD-10-CM | POA: Diagnosis not present

## 2021-10-12 ENCOUNTER — Other Ambulatory Visit (HOSPITAL_COMMUNITY): Payer: Self-pay

## 2021-10-13 ENCOUNTER — Telehealth: Payer: Self-pay | Admitting: General Practice

## 2021-10-13 NOTE — Telephone Encounter (Signed)
[place] CSW Progress Notes  Returned call to patient - he is interested in the [employer].  Made appointment for him to come to [place] at his next [facility] visit on 10/21/21.  Edwyna Shell, LCSW Clinical Social Worker Phone:  [phone]

## 2021-10-21 ENCOUNTER — Encounter: Payer: Self-pay | Admitting: Hematology and Oncology

## 2021-10-21 ENCOUNTER — Encounter: Payer: Self-pay | Admitting: General Practice

## 2021-10-21 ENCOUNTER — Inpatient Hospital Stay: Payer: Medicaid Other | Attending: Hematology and Oncology

## 2021-10-21 ENCOUNTER — Inpatient Hospital Stay: Payer: Medicaid Other | Admitting: General Practice

## 2021-10-21 ENCOUNTER — Other Ambulatory Visit (HOSPITAL_COMMUNITY): Payer: Self-pay

## 2021-10-21 ENCOUNTER — Ambulatory Visit: Payer: Medicaid Other | Admitting: Physician Assistant

## 2021-10-21 ENCOUNTER — Inpatient Hospital Stay (HOSPITAL_BASED_OUTPATIENT_CLINIC_OR_DEPARTMENT_OTHER): Payer: Medicaid Other | Admitting: Hematology and Oncology

## 2021-10-21 ENCOUNTER — Other Ambulatory Visit: Payer: Self-pay

## 2021-10-21 VITALS — BP 106/67 | HR 55 | Temp 97.5°F | Resp 18 | Wt 155.0 lb

## 2021-10-21 DIAGNOSIS — Z885 Allergy status to narcotic agent status: Secondary | ICD-10-CM | POA: Insufficient documentation

## 2021-10-21 DIAGNOSIS — C880 Waldenstrom macroglobulinemia: Secondary | ICD-10-CM | POA: Insufficient documentation

## 2021-10-21 DIAGNOSIS — Z79620 Long term (current) use of immunosuppressive biologic: Secondary | ICD-10-CM | POA: Insufficient documentation

## 2021-10-21 DIAGNOSIS — I110 Hypertensive heart disease with heart failure: Secondary | ICD-10-CM | POA: Diagnosis not present

## 2021-10-21 DIAGNOSIS — D500 Iron deficiency anemia secondary to blood loss (chronic): Secondary | ICD-10-CM | POA: Diagnosis not present

## 2021-10-21 DIAGNOSIS — Z5941 Food insecurity: Secondary | ICD-10-CM | POA: Diagnosis not present

## 2021-10-21 DIAGNOSIS — Z79899 Other long term (current) drug therapy: Secondary | ICD-10-CM | POA: Diagnosis not present

## 2021-10-21 DIAGNOSIS — R590 Localized enlarged lymph nodes: Secondary | ICD-10-CM | POA: Diagnosis not present

## 2021-10-21 DIAGNOSIS — Z8249 Family history of ischemic heart disease and other diseases of the circulatory system: Secondary | ICD-10-CM | POA: Diagnosis not present

## 2021-10-21 DIAGNOSIS — M2550 Pain in unspecified joint: Secondary | ICD-10-CM | POA: Insufficient documentation

## 2021-10-21 DIAGNOSIS — D472 Monoclonal gammopathy: Secondary | ICD-10-CM | POA: Diagnosis not present

## 2021-10-21 DIAGNOSIS — Z596 Low income: Secondary | ICD-10-CM | POA: Insufficient documentation

## 2021-10-21 DIAGNOSIS — R0602 Shortness of breath: Secondary | ICD-10-CM | POA: Insufficient documentation

## 2021-10-21 DIAGNOSIS — D649 Anemia, unspecified: Secondary | ICD-10-CM | POA: Insufficient documentation

## 2021-10-21 DIAGNOSIS — I5022 Chronic systolic (congestive) heart failure: Secondary | ICD-10-CM | POA: Insufficient documentation

## 2021-10-21 DIAGNOSIS — R112 Nausea with vomiting, unspecified: Secondary | ICD-10-CM | POA: Insufficient documentation

## 2021-10-21 LAB — CMP (CANCER CENTER ONLY)
ALT: 7 U/L (ref 0–44)
AST: 10 U/L — ABNORMAL LOW (ref 15–41)
Albumin: 3.9 g/dL (ref 3.5–5.0)
Alkaline Phosphatase: 74 U/L (ref 38–126)
Anion gap: 7 (ref 5–15)
BUN: 12 mg/dL (ref 6–20)
CO2: 25 mmol/L (ref 22–32)
Calcium: 9 mg/dL (ref 8.9–10.3)
Chloride: 104 mmol/L (ref 98–111)
Creatinine: 0.91 mg/dL (ref 0.61–1.24)
GFR, Estimated: >60 mL/min (ref >60)
Glucose, Bld: 87 mg/dL (ref 70–99)
Potassium: 4 mmol/L (ref 3.5–5.1)
Sodium: 136 mmol/L (ref 135–145)
Total Bilirubin: 0.4 mg/dL (ref 0.3–1.2)
Total Protein: 6.4 g/dL — ABNORMAL LOW (ref 6.5–8.1)

## 2021-10-21 LAB — CBC WITH DIFFERENTIAL (CANCER CENTER ONLY)
Abs Immature Granulocytes: 0.02 10*3/uL (ref 0.00–0.07)
Basophils Absolute: 0 10*3/uL (ref 0.0–0.1)
Basophils Relative: 1 %
Eosinophils Absolute: 0.5 10*3/uL (ref 0.0–0.5)
Eosinophils Relative: 8 %
HCT: 37.2 % — ABNORMAL LOW (ref 39.0–52.0)
Hemoglobin: 12 g/dL — ABNORMAL LOW (ref 13.0–17.0)
Immature Granulocytes: 0 %
Lymphocytes Relative: 23 %
Lymphs Abs: 1.4 10*3/uL (ref 0.7–4.0)
MCH: 26.8 pg (ref 26.0–34.0)
MCHC: 32.3 g/dL (ref 30.0–36.0)
MCV: 83 fL (ref 80.0–100.0)
Monocytes Absolute: 0.5 10*3/uL (ref 0.1–1.0)
Monocytes Relative: 9 %
Neutro Abs: 3.5 10*3/uL (ref 1.7–7.7)
Neutrophils Relative %: 59 %
Platelet Count: 310 10*3/uL (ref 150–400)
RBC: 4.48 MIL/uL (ref 4.22–5.81)
RDW: 14.8 % (ref 11.5–15.5)
WBC Count: 5.9 10*3/uL (ref 4.0–10.5)
nRBC: 0 % (ref 0.0–0.2)

## 2021-10-21 LAB — LACTATE DEHYDROGENASE: LDH: 106 U/L (ref 98–192)

## 2021-10-21 LAB — IRON AND IRON BINDING CAPACITY (CC-WL,HP ONLY)
Iron: 40 ug/dL — ABNORMAL LOW (ref 45–182)
Saturation Ratios: 12 % — ABNORMAL LOW (ref 17.9–39.5)
TIBC: 344 ug/dL (ref 250–450)
UIBC: 304 ug/dL (ref 117–376)

## 2021-10-21 LAB — FERRITIN: Ferritin: 52 ng/mL (ref 24–336)

## 2021-10-21 MED ORDER — DOXYCYCLINE HYCLATE 100 MG PO TABS
100.0000 mg | ORAL_TABLET | Freq: Two times a day (BID) | ORAL | 0 refills | Status: AC
  Filled 2021-10-21: qty 20, 10d supply, fill #0

## 2021-10-21 NOTE — Progress Notes (Signed)
[place] CSW Progress Notes  Patient received [employer] today.  Edwyna Shell, LCSW Clinical Social Worker Phone:  [phone]

## 2021-10-21 NOTE — Progress Notes (Signed)
[place] [phone]   [phone]  PROGRESS NOTE  Patient Care Team: Delene Ruffini, MD as PCP - General (Internal Medicine)  Hematological/Oncological History # Waldenstrom's Macroglobulinemia # IgM Monoclonal Gammopathy 04/07/2021: CT A/p showed mildly enlarged retroperitoneal lymph nodes and bilateral inguinal lymph nodes.  04/08/2021: IgM Kappa M protein 1.2 04/09/2021: WBC 11.1, Hgb 9.5, MCV 75.4, Plt 525 04/20/2021: Kappa 977, Lambda 7.8, K/L ratio 125.28. Serum viscosity 1.8 05/06/2021: Establish care with Dr. 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 Infusion reaction with ritux, held halfway through.  06/12/2021: Cycle 2 Day 1 of Ibrutinib + Rituximab 06/19/2021: Cycle 3 Day 1 of Ibrutinib + Rituximab 06/26/2021: completed 4 weeks of rituximab. Continued on daily ibrutinib   Interval History:  Connor Solomon 61 y.o. male with medical history significant for Waldenstrom's macroglobulinemia who presents for a follow up visit. The patient's last visit was on 09/16/2021. In the interim since the last visit he has continued on ibrutinib therapy.  On exam today Connor Solomon reports he has been doing "pretty good" on aspirin therapy.  He reports his energy levels are approximately same.  He notes he is still trying to figure out how to accomplish his day-to-day tasks without "overdoing it".  He notes that his joint pain only flares up when he "overdoes it".  He notes that he is doing his best to try to stay active.  He does unfortunately have a lesion on his right thumb which she thinks is secondary to an infection.  He reports that it has not been improving much and the skin has been peeling around the lesion.  He does occasionally have episodes of nausea but discontinue  the olanzapine because it "did not feel right".  He notes his appetite has been so-so but he is doing his best to eat because he knows that he has to.  He reports he has a daily bowel movement.  He denies easy bruising or signs of bleeding.  He reports stable shortness of breath mainly with exertion.  He denies fevers, chills, night sweats, chest pain, headache or vision changes. Full 10 point ROS is listed below.  MEDICAL HISTORY:  Past Medical History:  Diagnosis Date   Acute heart failure ([place]) 04/08/2021   Acute HFrEF (heart failure with reduced ejection fraction) ([place]) 04/07/2021   Arthritis    hands, elbows bilaterally   Cancer ([facility])    Full dentures    Hypertension    Pt states he doen not have HTN and has never been treated for HTN   Kidney stone on left side last stone 4-5 yrs ago   recurrent (3 episodes)   Panic disorder    pt states has resolved   Syncope    resolved- was related to panic attacks    SURGICAL HISTORY: Past Surgical History:  Procedure Laterality Date   CARPAL METACARPAL FUSION WITH DISTAL RADIAL BONE GRAFT Left 05/30/2013   Procedure: LEFT THUMB METACARPAL JOINT FUSION;  Surgeon: Schuyler Amor, MD;  Location: [facility];  Service: Orthopedics;  Laterality: Left;   ESOPHAGOGASTRODUODENOSCOPY N/A 02/12/2014   Procedure: ESOPHAGOGASTRODUODENOSCOPY (EGD);  Surgeon: Jerene Bears, MD;  Location: [facility] ENDOSCOPY;  Service: Endoscopy;  Laterality: N/A;   FOREIGN BODY REMOVAL N/A 02/12/2014   Procedure: FOREIGN BODY REMOVAL;  Surgeon: Jerene Bears, MD;  Location: [facility];  Service: Endoscopy;  Laterality: N/A;   OPEN REDUCTION INTERNAL FIXATION (ORIF) DISTAL RADIAL FRACTURE Left 11/29/2012   Procedure: OPEN REDUCTION INTERNAL FIXATION (ORIF) DISTAL RADIAL FRACTURE;  Surgeon: Schuyler Amor, MD;  Location: [place] [facility];  Service: Orthopedics;  Laterality: Left;  LEFT DISTAL RADIUS OSTEOTOMY WITH BONE GRAFT   RIGHT/LEFT HEART CATH AND  CORONARY ANGIOGRAPHY N/A 04/09/2021   Procedure: RIGHT/LEFT HEART CATH AND CORONARY ANGIOGRAPHY;  Surgeon: Jolaine Artist, MD;  Location: [facility];  Service: Cardiovascular;  Laterality: N/A;   TENDON REPAIR Left 02/07/2013   Procedure: LEFT EXTENSOR INDICIS PROPRIUS  TO EXTENSOR POLLICIS LONGUS TENDON TRANSFER;  Surgeon: Schuyler Amor, MD;  Location: [facility];  Service: Orthopedics;  Laterality: Left;   WISDOM TOOTH EXTRACTION     WRIST SURGERY     fx only    SOCIAL HISTORY: Social History   Socioeconomic History   Marital status: Single    Spouse name: Not on file   Number of children: Not on file   Years of education: Not on file   Highest education level: Not on file  Occupational History   Not on file  Tobacco Use   Smoking status: Former    Types: Cigars    Quit date: 10/11/2020    Years since quitting: 1.0   Smokeless tobacco: Never   Tobacco comments:    smokes 1 cigar a week  Substance and Sexual Activity   Alcohol use: No   Drug use: No   Sexual activity: Not on file  Other Topics Concern   Not on file  Social History Narrative   Not on file   Social Determinants of Health   Financial Resource Strain: High Risk   Difficulty of Paying Living Expenses: Very hard  Food Insecurity: Food Insecurity Present   Worried About [place] in the Last Year: Sometimes true   Ran Out of Food in the Last Year: Sometimes true  Transportation Needs: No Transportation Needs   Lack of Transportation (Medical): No   Lack of Transportation (Non-Medical): No  Physical Activity: Not on file  Stress: Stress Concern Present   Feeling of Stress : Very much  Social Connections: Not on file  Intimate Partner Violence: Not on file    FAMILY HISTORY: Family History  Problem Relation Age of Onset   Heart attack Father    Sudden Cardiac Death Father 46   Diabetes Neg Hx    Hyperlipidemia Neg Hx    Hypertension Neg Hx     ALLERGIES:   is allergic to rituxan [rituximab] and hydrocodone.  MEDICATIONS:  Current Outpatient Medications  Medication Sig Dispense Refill   doxycycline (VIBRA-TABS) 100 MG tablet Take 1 tablet (100 mg total) by mouth 2 (two) times daily for 10 days. 20 tablet 0   acetaminophen (TYLENOL) 500 MG tablet Take 500 mg by mouth every 6 (six) hours as needed.     albuterol (PROAIR HFA) 108 (90 Base) MCG/ACT inhaler Inhale 2 puffs into the lungs every 6 (six) hours as needed for wheezing or shortness of breath. 8.5 g 2   allopurinol (ZYLOPRIM) 300 MG tablet Take 1 tablet (300 mg total) by mouth daily. 90 tablet 1   carvedilol (COREG) 3.125 MG tablet Take 1 tablet (3.125 mg total) by mouth 2 (two) times daily. 68 tablet 2   empagliflozin (JARDIANCE) 10 MG TABS tablet Take 1 tablet (10 mg total) by mouth daily before breakfast. 30 tablet 11  eplerenone (INSPRA) 25 MG tablet Take 1/2 tablet (12.5 mg total) by mouth daily. 15 tablet 11   furosemide (LASIX) 20 MG tablet Take 1 tablet (20 mg total) by mouth daily as needed for swelling 100 tablet 2   ibrutinib 420 MG TABS Take 420 mg by mouth daily. 30 tablet 2   ondansetron (ZOFRAN) 8 MG tablet Take 1 tablet by mouth every 8 hours as needed. 30 tablet 0   oxyCODONE-acetaminophen (PERCOCET) 5-325 MG tablet Take 1 tablet by mouth every 6 hours as needed for severe pain. 60 tablet 0   sacubitril-valsartan (ENTRESTO) 24-26 MG Take 1 tablet by mouth 2 (two) times daily. 60 tablet 5   No current facility-administered medications for this visit.    REVIEW OF SYSTEMS:   Constitutional: ( - ) fevers, ( - )  chills , ( - ) night sweats Eyes: ( - ) blurriness of vision, ( - ) double vision, ( - ) watery eyes Ears, nose, mouth, throat, and face: ( - ) mucositis, ( - ) sore throat Respiratory: ( - ) cough, ( - ) dyspnea, ( - ) wheezes Cardiovascular: ( - ) palpitation, ( - ) chest discomfort, ( - ) lower extremity swelling Gastrointestinal:  ( - ) nausea, ( - ) heartburn,  ( - ) change in bowel habits Skin: ( - ) abnormal skin rashes Lymphatics: ( - ) new lymphadenopathy, ( - ) easy bruising Neurological: ( - ) numbness, ( - ) tingling, ( - ) new weaknesses Behavioral/Psych: ( - ) mood change, ( - ) new changes  All other systems were reviewed with the patient and are negative.  PHYSICAL EXAMINATION: ECOG PERFORMANCE STATUS: 1 - Symptomatic but completely ambulatory  Vitals:   10/21/21 1049  BP: 106/67  Pulse: (!) 55  Resp: 18  Temp: (!) 97.5 F (36.4 C)  SpO2: 100%    Filed Weights   10/21/21 1049  Weight: 155 lb (70.3 kg)     GENERAL: Well-appearing middle-aged Caucasian male, alert, no distress and comfortable SKIN: skin color, texture, turgor are normal, no rashes or significant lesions EYES: conjunctiva are pink and non-injected, sclera clear LUNGS: clear to auscultation and percussion with normal breathing effort HEART: regular rate & rhythm and no murmurs and no lower extremity edema  PSYCH: alert & oriented x 3, fluent speech NEURO: no focal motor/sensory deficits  LABORATORY DATA:  I have reviewed the data as listed CBC Latest Ref Rng & Units 10/21/2021 09/16/2021 08/18/2021  WBC 4.0 - 10.5 K/uL 5.9 6.5 7.8  Hemoglobin 13.0 - 17.0 g/dL 12.0(L) 11.6(L) 12.5(L)  Hematocrit 39.0 - 52.0 % 37.2(L) 35.4(L) 39.2  Platelets 150 - 400 K/uL 310 328 261    CMP Latest Ref Rng & Units 10/21/2021 09/16/2021 08/18/2021  Glucose 70 - 99 mg/dL 87 93 91  BUN 6 - 20 mg/dL _0 Creatinine 0.61 - 1.24 mg/dL 0.91 0.89 0.87  Sodium 135 - 145 mmol/L 136 136 133(L)  Potassium 3.5 - 5.1 mmol/L 4.0 3.7 4.2  Chloride 98 - 111 mmol/L 104 105 99  CO2 22 - 32 mmol/L _1 Calcium 8.9 - 10.3 mg/dL 9.0 8.6(L) 9.0  Total Protein 6.5 - 8.1 g/dL 6.4(L) 6.2(L) -  Total Bilirubin 0.3 - 1.2 mg/dL 0.4 0.6 -  Alkaline Phos 38 - 126 U/L 74 91 -  AST 15 - 41 U/L 10(L) 14(L) -  ALT 0 - 44 U/L 7 12 -    Lab Results  Component Value Date   MPROTEIN 0.2 (H)  09/16/2021   MPROTEIN 0.2 (H) 08/18/2021   MPROTEIN 1.2 (H) 04/08/2021   Lab Results  Component Value Date   KPAFRELGTCHN 29.1 (H) 09/16/2021   KPAFRELGTCHN 43.1 (H) 08/18/2021   LAMBDASER 3.8 (L) 09/16/2021   LAMBDASER 5.8 08/18/2021   KAPLAMBRATIO 7.66 (H) 09/16/2021   KAPLAMBRATIO 7.43 (H) 08/18/2021   KAPLAMBRATIO 136.50 (H) 07/09/2021    RADIOGRAPHIC STUDIES: No results found.  ASSESSMENT & PLAN Connor Solomon 61 y.o. male with medical history significant for Waldenstrom's macroglobulinemia who presents for a follow up visit.   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  There is no clear evidence of hyperviscosity syndrome at this time.  At this time we will plan to proceed with ibrutinib and rituximab therapy.  One could also consider Bendamustine and rituximab therapy but given the patient's degree of anemia I would prefer pursuing ibrutinib and rituximab at this time.  Additionally ibrutinib/rituximab are category 1 recommendations per the NCCN guidelines.  The treatment will consist of ibrutinib 420 mg p.o. daily with rituximab on weeks 1 through 4 as well as weeks 27 through 30.  Previously we discussed the risks and benefits of therapy including (but not limited to) risk of bleeding, fatigue, atypical infections, and cardiac arrhythmias.  The patient voices understanding of this plan moving forward.  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 --will order monthly SPEP, SFLC, and UPEP --labs today were reviewed without any intervention  needed. Pending SPEP and sFLC levels.  --plan for next round of rituximab to start on Dec 04, 2021  --RTC in 4 weeks to assure he is improving  # Thumb Lesion -- Consistent with superficial infection on the palmar surface of the thumb. --We will prescribe a 10-day course of doxycycline in order to help treat this.  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  -- At this time the patient's hemoglobin is most consistent with anemia due to lymphomatous involvement of bone marrow --There does appear to be a component of iron deficiency anemia as well.  He is not taking oral iron at this time. We will repeat oral iron levels in 4 weeks at next visit.  --Continue to monitor.  #Nausea and vomiting --Currently taking zofran PRN.  --Unable to tolerate olanzopine so discontinued --continue zofran scheduled q 8 hours to minimize episodes.   #Supportive Care -- chemotherapy education complete -- port placement not required -- allopurinol 3106m PO daily for TLS prophylaxis  No orders of the defined types were placed in this encounter.   All questions were answered. The patient knows to call the clinic with any problems, questions or concerns.  I have spent a total of 30 minutes minutes of face-to-face and non-face-to-face time, preparing to see the patient, obtaining and/or reviewing separately obtained history, performing a medically appropriate examination, counseling and educating the patient, documenting clinical information in the electronic health record, and care coordination.    JLedell Peoples MD Department of Hematology/Oncology [facility]at [facility] CenterPhone: [phone]: [phone]: [email or web address]  10/21/2021 1:10 PM  Dimopoulos MA, TJosephina Shih  A, Trotman J, Garca-Sanz R, Macdonald D, Leblond V, Mahe B, Herbaux C, Tam C, Orsucci L, Pal[place] ML, Matous JV, Shustik C, Kastritis E, [place], Li J, Salman  Z, Graef T, Buske C; iNNOV[employer] Study Group and the [employer] for [employer]. Phase 3 Trial of Ibrutinib plus Rituximab in Waldenstrm's Macroglobulinemia. Alison Stalling J Med. 2018 Jun 21;378(25):2399-2410.   --At 30 months, the progression-free survival rate was 82% with ibrutinib-rituximab versus 28% with placebo-rituximab (hazard ratio for progression or death, 0.20; P<0.001).

## 2021-10-22 ENCOUNTER — Telehealth: Payer: Self-pay | Admitting: Hematology and Oncology

## 2021-10-22 LAB — KAPPA/LAMBDA LIGHT CHAINS
Kappa free light chain: 43.4 mg/L — ABNORMAL HIGH (ref 3.3–19.4)
Kappa, lambda light chain ratio: 0.59 (ref 0.26–1.65)
Lambda free light chains: 73 mg/L — ABNORMAL HIGH (ref 5.7–26.3)

## 2021-10-22 NOTE — Telephone Encounter (Signed)
Scheduled per 1/11 los, pt has been called and confirmed appt

## 2021-10-23 ENCOUNTER — Other Ambulatory Visit: Payer: Self-pay | Admitting: Hematology and Oncology

## 2021-10-23 ENCOUNTER — Other Ambulatory Visit (HOSPITAL_COMMUNITY): Payer: Self-pay

## 2021-10-23 ENCOUNTER — Other Ambulatory Visit (HOSPITAL_COMMUNITY): Payer: Self-pay | Admitting: Family Medicine

## 2021-10-23 DIAGNOSIS — N62 Hypertrophy of breast: Secondary | ICD-10-CM

## 2021-10-23 MED ORDER — OXYCODONE-ACETAMINOPHEN 5-325 MG PO TABS
1.0000 | ORAL_TABLET | Freq: Four times a day (QID) | ORAL | 0 refills | Status: DC | PRN
  Filled 2021-10-23: qty 60, 15d supply, fill #0

## 2021-10-23 MED ORDER — CARVEDILOL 3.125 MG PO TABS
3.1250 mg | ORAL_TABLET | Freq: Two times a day (BID) | ORAL | 2 refills | Status: DC
  Filled 2021-10-23: qty 68, 34d supply, fill #0
  Filled 2021-11-23: qty 68, 34d supply, fill #1
  Filled 2022-01-11 – 2022-01-20 (×2): qty 68, 34d supply, fill #2

## 2021-10-24 ENCOUNTER — Other Ambulatory Visit (HOSPITAL_COMMUNITY): Payer: Self-pay

## 2021-10-26 ENCOUNTER — Other Ambulatory Visit (HOSPITAL_COMMUNITY): Payer: Self-pay

## 2021-10-26 LAB — MULTIPLE MYELOMA PANEL, SERUM
Albumin SerPl Elph-Mcnc: 3.4 g/dL (ref 2.9–4.4)
Albumin/Glob SerPl: 1.5 (ref 0.7–1.7)
Alpha 1: 0.3 g/dL (ref 0.0–0.4)
Alpha2 Glob SerPl Elph-Mcnc: 0.8 g/dL (ref 0.4–1.0)
B-Globulin SerPl Elph-Mcnc: 0.9 g/dL (ref 0.7–1.3)
Gamma Glob SerPl Elph-Mcnc: 0.3 g/dL — ABNORMAL LOW (ref 0.4–1.8)
Globulin, Total: 2.4 g/dL (ref 2.2–3.9)
IgA: 10 mg/dL — ABNORMAL LOW (ref 90–386)
IgG (Immunoglobin G), Serum: 200 mg/dL — ABNORMAL LOW (ref 603–1613)
IgM (Immunoglobulin M), Srm: 183 mg/dL — ABNORMAL HIGH (ref 20–172)
M Protein SerPl Elph-Mcnc: 0.2 g/dL — ABNORMAL HIGH
Total Protein ELP: 5.8 g/dL — ABNORMAL LOW (ref 6.0–8.5)

## 2021-11-03 ENCOUNTER — Other Ambulatory Visit: Payer: Self-pay | Admitting: Hematology and Oncology

## 2021-11-04 ENCOUNTER — Other Ambulatory Visit (HOSPITAL_COMMUNITY): Payer: Self-pay

## 2021-11-04 ENCOUNTER — Encounter: Payer: Self-pay | Admitting: Hematology and Oncology

## 2021-11-04 MED ORDER — ONDANSETRON HCL 8 MG PO TABS
8.0000 mg | ORAL_TABLET | Freq: Three times a day (TID) | ORAL | 0 refills | Status: DC | PRN
  Filled 2021-11-04: qty 30, 10d supply, fill #0

## 2021-11-05 ENCOUNTER — Other Ambulatory Visit: Payer: Self-pay | Admitting: Hematology and Oncology

## 2021-11-05 MED ORDER — IBRUTINIB 420 MG PO TABS: 420.0000 mg | ORAL_TABLET | Freq: Every day | ORAL | 2 refills | Status: DC

## 2021-11-06 ENCOUNTER — Other Ambulatory Visit: Payer: Self-pay | Admitting: Hematology and Oncology

## 2021-11-06 ENCOUNTER — Other Ambulatory Visit (HOSPITAL_COMMUNITY): Payer: Self-pay

## 2021-11-06 DIAGNOSIS — N62 Hypertrophy of breast: Secondary | ICD-10-CM

## 2021-11-06 MED ORDER — OXYCODONE-ACETAMINOPHEN 5-325 MG PO TABS
1.0000 | ORAL_TABLET | Freq: Four times a day (QID) | ORAL | 0 refills | Status: DC | PRN
  Filled 2021-11-06 – 2021-11-09 (×2): qty 60, 15d supply, fill #0

## 2021-11-09 ENCOUNTER — Other Ambulatory Visit (HOSPITAL_COMMUNITY): Payer: Self-pay

## 2021-11-11 DIAGNOSIS — Z419 Encounter for procedure for purposes other than remedying health state, unspecified: Secondary | ICD-10-CM | POA: Diagnosis not present

## 2021-11-18 ENCOUNTER — Inpatient Hospital Stay: Payer: Medicaid Other | Admitting: Hematology and Oncology

## 2021-11-18 ENCOUNTER — Inpatient Hospital Stay: Payer: Medicaid Other

## 2021-11-20 ENCOUNTER — Other Ambulatory Visit: Payer: Self-pay | Admitting: Hematology and Oncology

## 2021-11-20 ENCOUNTER — Other Ambulatory Visit (HOSPITAL_COMMUNITY): Payer: Self-pay

## 2021-11-20 DIAGNOSIS — N62 Hypertrophy of breast: Secondary | ICD-10-CM

## 2021-11-20 MED ORDER — OXYCODONE-ACETAMINOPHEN 5-325 MG PO TABS
1.0000 | ORAL_TABLET | Freq: Four times a day (QID) | ORAL | 0 refills | Status: DC | PRN
  Filled 2021-11-20 – 2021-11-23 (×2): qty 60, 15d supply, fill #0

## 2021-11-23 ENCOUNTER — Other Ambulatory Visit (HOSPITAL_COMMUNITY): Payer: Self-pay

## 2021-11-24 ENCOUNTER — Inpatient Hospital Stay: Payer: Medicaid Other | Attending: Hematology and Oncology

## 2021-11-24 ENCOUNTER — Other Ambulatory Visit: Payer: Self-pay

## 2021-11-24 ENCOUNTER — Inpatient Hospital Stay (HOSPITAL_BASED_OUTPATIENT_CLINIC_OR_DEPARTMENT_OTHER): Payer: Medicaid Other | Admitting: Hematology and Oncology

## 2021-11-24 ENCOUNTER — Other Ambulatory Visit (HOSPITAL_COMMUNITY): Payer: Self-pay

## 2021-11-24 VITALS — BP 114/74 | HR 65 | Temp 97.0°F | Resp 16 | Wt 148.9 lb

## 2021-11-24 DIAGNOSIS — C880 Waldenstrom macroglobulinemia: Secondary | ICD-10-CM | POA: Insufficient documentation

## 2021-11-24 DIAGNOSIS — R634 Abnormal weight loss: Secondary | ICD-10-CM | POA: Insufficient documentation

## 2021-11-24 DIAGNOSIS — R112 Nausea with vomiting, unspecified: Secondary | ICD-10-CM | POA: Diagnosis not present

## 2021-11-24 DIAGNOSIS — Z87891 Personal history of nicotine dependence: Secondary | ICD-10-CM | POA: Insufficient documentation

## 2021-11-24 DIAGNOSIS — I10 Essential (primary) hypertension: Secondary | ICD-10-CM | POA: Diagnosis not present

## 2021-11-24 DIAGNOSIS — M1990 Unspecified osteoarthritis, unspecified site: Secondary | ICD-10-CM | POA: Insufficient documentation

## 2021-11-24 DIAGNOSIS — D649 Anemia, unspecified: Secondary | ICD-10-CM | POA: Diagnosis not present

## 2021-11-24 DIAGNOSIS — Z596 Low income: Secondary | ICD-10-CM | POA: Diagnosis not present

## 2021-11-24 DIAGNOSIS — Z79899 Other long term (current) drug therapy: Secondary | ICD-10-CM | POA: Diagnosis not present

## 2021-11-24 DIAGNOSIS — Z5112 Encounter for antineoplastic immunotherapy: Secondary | ICD-10-CM | POA: Insufficient documentation

## 2021-11-24 DIAGNOSIS — D472 Monoclonal gammopathy: Secondary | ICD-10-CM | POA: Diagnosis not present

## 2021-11-24 DIAGNOSIS — Z8249 Family history of ischemic heart disease and other diseases of the circulatory system: Secondary | ICD-10-CM | POA: Insufficient documentation

## 2021-11-24 LAB — CMP (CANCER CENTER ONLY)
ALT: 11 U/L (ref 0–44)
AST: 18 U/L (ref 15–41)
Albumin: 4.3 g/dL (ref 3.5–5.0)
Alkaline Phosphatase: 78 U/L (ref 38–126)
Anion gap: 9 (ref 5–15)
BUN: 11 mg/dL (ref 6–20)
CO2: 22 mmol/L (ref 22–32)
Calcium: 8.8 mg/dL — ABNORMAL LOW (ref 8.9–10.3)
Chloride: 104 mmol/L (ref 98–111)
Creatinine: 0.87 mg/dL (ref 0.61–1.24)
GFR, Estimated: >60 mL/min (ref >60)
Glucose, Bld: 105 mg/dL — ABNORMAL HIGH (ref 70–99)
Potassium: 3.7 mmol/L (ref 3.5–5.1)
Sodium: 135 mmol/L (ref 135–145)
Total Bilirubin: 1.5 mg/dL — ABNORMAL HIGH (ref 0.3–1.2)
Total Protein: 6.5 g/dL (ref 6.5–8.1)

## 2021-11-24 LAB — CBC WITH DIFFERENTIAL (CANCER CENTER ONLY)
Abs Immature Granulocytes: 0.02 10*3/uL (ref 0.00–0.07)
Basophils Absolute: 0.1 10*3/uL (ref 0.0–0.1)
Basophils Relative: 1 %
Eosinophils Absolute: 0.6 10*3/uL — ABNORMAL HIGH (ref 0.0–0.5)
Eosinophils Relative: 7 %
HCT: 43.9 % (ref 39.0–52.0)
Hemoglobin: 14.6 g/dL (ref 13.0–17.0)
Immature Granulocytes: 0 %
Lymphocytes Relative: 14 %
Lymphs Abs: 1.1 10*3/uL (ref 0.7–4.0)
MCH: 27.5 pg (ref 26.0–34.0)
MCHC: 33.3 g/dL (ref 30.0–36.0)
MCV: 82.8 fL (ref 80.0–100.0)
Monocytes Absolute: 1 10*3/uL (ref 0.1–1.0)
Monocytes Relative: 12 %
Neutro Abs: 5.5 10*3/uL (ref 1.7–7.7)
Neutrophils Relative %: 66 %
Platelet Count: 270 10*3/uL (ref 150–400)
RBC: 5.3 MIL/uL (ref 4.22–5.81)
RDW: 14.8 % (ref 11.5–15.5)
WBC Count: 8.4 10*3/uL (ref 4.0–10.5)
nRBC: 0 % (ref 0.0–0.2)

## 2021-11-24 LAB — LACTATE DEHYDROGENASE: LDH: 127 U/L (ref 98–192)

## 2021-11-24 MED ORDER — METOCLOPRAMIDE HCL 10 MG PO TABS: 10.0000 mg | ORAL_TABLET | Freq: Three times a day (TID) | ORAL | 1 refills | Status: DC | PRN

## 2021-11-24 MED ORDER — ALLOPURINOL 300 MG PO TABS: 300.0000 mg | ORAL_TABLET | Freq: Every day | ORAL | 0 refills | Status: DC

## 2021-11-24 NOTE — Progress Notes (Signed)
[facility] [phone]   [phone]  PROGRESS NOTE  Patient Care Team: Delene Ruffini, MD as PCP - General (Internal Medicine)  Hematological/Oncological History # Waldenstrom's Macroglobulinemia # IgM Monoclonal Gammopathy 04/07/2021: CT A/p showed mildly enlarged retroperitoneal lymph nodes and bilateral inguinal lymph nodes.  04/08/2021: IgM Kappa M protein 1.2 04/09/2021: WBC 11.1, Hgb 9.5, MCV 75.4, Plt 525 04/20/2021: Kappa 977, Lambda 7.8, K/L ratio 125.28. Serum viscosity 1.8 05/06/2021: Establish care with Dr. 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 Infusion reaction with ritux, held halfway through.  06/12/2021: Cycle 2 Day 1 of Ibrutinib + Rituximab 06/19/2021: Cycle 3 Day 1 of Ibrutinib + Rituximab 06/26/2021: completed 4 weeks of rituximab. Continued on daily ibrutinib   Interval History:  Connor Solomon 61 y.o. male with medical history significant for Waldenstrom's macroglobulinemia who presents for a follow up visit. The patient's last visit was on 10/21/2021. In the interim since the last visit he has continued on ibrutinib therapy.  On exam today Connor Solomon reports he continues to have nausea and vomiting.  Unfortunately he has continued to have weight loss and is down to 148 pounds from 155 pounds.  He notes he takes his Zofran approximately every 8 hours.  He notes that he is also trying to drink Ensure now in order to help bolster his weight.  He notes other than nausea he has been "okay".  He was sick all day yesterday and reports that these bouts of nausea that typically occur at least once a month.  He notes that when he throws up he throws up all of the food and eventually throws up brown vomitus.  He notes that olanzapine did not work well for him  and that Zofran has been the most effective so far.  He otherwise is not having any other concerning side effects or symptoms at this time.  He denies fevers, chills, night sweats, chest pain, headache or vision changes. Full 10 point ROS is listed below.  MEDICAL HISTORY:  Past Medical History:  Diagnosis Date   Acute heart failure ([place]) 04/08/2021   Acute HFrEF (heart failure with reduced ejection fraction) ([place]) 04/07/2021   Arthritis    hands, elbows bilaterally   Cancer (HCC)    Full dentures    Hypertension    Pt states he doen not have HTN and has never been treated for HTN   Kidney stone on left side last stone 4-5 yrs ago   recurrent (3 episodes)   Panic disorder    pt states has resolved   Syncope    resolved- was related to panic attacks    SURGICAL HISTORY: Past Surgical History:  Procedure Laterality Date   CARPAL METACARPAL FUSION WITH DISTAL RADIAL BONE GRAFT Left 05/30/2013   Procedure: LEFT THUMB METACARPAL JOINT FUSION;  Surgeon: Schuyler Amor, MD;  Location: [facility];  Service: Orthopedics;  Laterality: Left;   ESOPHAGOGASTRODUODENOSCOPY N/A 02/12/2014   Procedure: ESOPHAGOGASTRODUODENOSCOPY (EGD);  Surgeon: Jerene Bears, MD;  Location[facility] ENDOSCOPY;  Service: Endoscopy;  Laterality: N/A;   FOREIGN BODY REMOVAL N/A 02/12/2014   Procedure: FOREIGN BODY REMOVAL;  Surgeon: Jerene Bears, MD;  Location: [facility];  Service: Endoscopy;  Laterality: N/A;   OPEN REDUCTION INTERNAL FIXATION (ORIF) DISTAL RADIAL FRACTURE Left 11/29/2012   Procedure: OPEN REDUCTION INTERNAL FIXATION (ORIF) DISTAL  RADIAL FRACTURE;  Surgeon: Schuyler Amor, MD;  Location: [facility];  Service: Orthopedics;  Laterality: Left;  LEFT DISTAL RADIUS OSTEOTOMY WITH BONE GRAFT   RIGHT/LEFT HEART CATH AND CORONARY ANGIOGRAPHY N/A 04/09/2021   Procedure: RIGHT/LEFT HEART CATH AND CORONARY ANGIOGRAPHY;  Surgeon: Jolaine Artist, MD;  Location: [facility];   Service: Cardiovascular;  Laterality: N/A;   TENDON REPAIR Left 02/07/2013   Procedure: LEFT EXTENSOR INDICIS PROPRIUS  TO EXTENSOR POLLICIS LONGUS TENDON TRANSFER;  Surgeon: Schuyler Amor, MD;  Location: [facility];  Service: Orthopedics;  Laterality: Left;   WISDOM TOOTH EXTRACTION     WRIST SURGERY     fx only    SOCIAL HISTORY: Social History   Socioeconomic History   Marital status: Single    Spouse name: Not on file   Number of children: Not on file   Years of education: Not on file   Highest education level: Not on file  Occupational History   Not on file  Tobacco Use   Smoking status: Former    Types: Cigars    Quit date: 10/11/2020    Years since quitting: 1.1   Smokeless tobacco: Never   Tobacco comments:    smokes 1 cigar a week  Substance and Sexual Activity   Alcohol use: No   Drug use: No   Sexual activity: Not on file  Other Topics Concern   Not on file  Social History Narrative   Not on file   Social Determinants of Health   Financial Resource Strain: High Risk   Difficulty of Paying Living Expenses: Very hard  Food Insecurity: Food Insecurity Present   Worried About Home in the Last Year: Sometimes true   Ran Out of Food in the Last Year: Sometimes true  Transportation Needs: No Transportation Needs   Lack of Transportation (Medical): No   Lack of Transportation (Non-Medical): No  Physical Activity: Not on file  Stress: Stress Concern Present   Feeling of Stress : Very much  Social Connections: Not on file  Intimate Partner Violence: Not on file    FAMILY HISTORY: Family History  Problem Relation Age of Onset   Heart attack Father    Sudden Cardiac Death Father 64   Diabetes Neg Hx    Hyperlipidemia Neg Hx    Hypertension Neg Hx     ALLERGIES:  is allergic to rituxan [rituximab] and hydrocodone.  MEDICATIONS:  Current Outpatient Medications  Medication Sig Dispense Refill   acetaminophen (TYLENOL) 500  MG tablet Take 500 mg by mouth every 6 (six) hours as needed.     albuterol (PROAIR HFA) 108 (90 Base) MCG/ACT inhaler Inhale 2 puffs into the lungs every 6 (six) hours as needed for wheezing or shortness of breath. 8.5 g 2   allopurinol (ZYLOPRIM) 300 MG tablet Take 1 tablet (300 mg total) by mouth daily. 90 tablet 1   carvedilol (COREG) 3.125 MG tablet Take 1 tablet by mouth 2 times daily. 68 tablet 2   empagliflozin (JARDIANCE) 10 MG TABS tablet Take 1 tablet (10 mg total) by mouth daily before breakfast. 30 tablet 11   eplerenone (INSPRA) 25 MG tablet Take 1/2 tablet (12.5 mg total) by mouth daily. 15 tablet 11   furosemide (LASIX) 20 MG tablet Take 1 tablet (20 mg total) by mouth daily as needed for swelling 100 tablet 2   ibrutinib 420 MG TABS Take 420 mg by mouth daily. 30 tablet  2   ondansetron (ZOFRAN) 8 MG tablet Take 1 tablet by mouth every 8 hours as needed. 30 tablet 0   oxyCODONE-acetaminophen (PERCOCET) 5-325 MG tablet Take 1 tablet by mouth every 6 hours as needed for severe pain. 60 tablet 0   sacubitril-valsartan (ENTRESTO) 24-26 MG Take 1 tablet by mouth 2 (two) times daily. 60 tablet 5   No current facility-administered medications for this visit.    REVIEW OF SYSTEMS:   Constitutional: ( - ) fevers, ( - )  chills , ( - ) night sweats Eyes: ( - ) blurriness of vision, ( - ) double vision, ( - ) watery eyes Ears, nose, mouth, throat, and face: ( - ) mucositis, ( - ) sore throat Respiratory: ( - ) cough, ( - ) dyspnea, ( - ) wheezes Cardiovascular: ( - ) palpitation, ( - ) chest discomfort, ( - ) lower extremity swelling Gastrointestinal:  ( - ) nausea, ( - ) heartburn, ( - ) change in bowel habits Skin: ( - ) abnormal skin rashes Lymphatics: ( - ) new lymphadenopathy, ( - ) easy bruising Neurological: ( - ) numbness, ( - ) tingling, ( - ) new weaknesses Behavioral/Psych: ( - ) mood change, ( - ) new changes  All other systems were reviewed with the patient and are  negative.  PHYSICAL EXAMINATION: ECOG PERFORMANCE STATUS: 1 - Symptomatic but completely ambulatory  There were no vitals filed for this visit.   There were no vitals filed for this visit.    GENERAL: Well-appearing middle-aged Caucasian male, alert, no distress and comfortable SKIN: skin color, texture, turgor are normal, no rashes or significant lesions EYES: conjunctiva are pink and non-injected, sclera clear LUNGS: clear to auscultation and percussion with normal breathing effort HEART: regular rate & rhythm and no murmurs and no lower extremity edema  PSYCH: alert & oriented x 3, fluent speech NEURO: no focal motor/sensory deficits  LABORATORY DATA:  I have reviewed the data as listed CBC Latest Ref Rng & Units 10/21/2021 09/16/2021 08/18/2021  WBC 4.0 - 10.5 K/uL 5.9 6.5 7.8  Hemoglobin 13.0 - 17.0 g/dL 12.0(L) 11.6(L) 12.5(L)  Hematocrit 39.0 - 52.0 % 37.2(L) 35.4(L) 39.2  Platelets 150 - 400 K/uL 310 328 261    CMP Latest Ref Rng & Units 10/21/2021 09/16/2021 08/18/2021  Glucose 70 - 99 mg/dL 87 93 91  BUN 6 - 20 mg/dL _0 Creatinine 0.61 - 1.24 mg/dL 0.91 0.89 0.87  Sodium 135 - 145 mmol/L 136 136 133(L)  Potassium 3.5 - 5.1 mmol/L 4.0 3.7 4.2  Chloride 98 - 111 mmol/L 104 105 99  CO2 22 - 32 mmol/L _1 Calcium 8.9 - 10.3 mg/dL 9.0 8.6(L) 9.0  Total Protein 6.5 - 8.1 g/dL 6.4(L) 6.2(L) -  Total Bilirubin 0.3 - 1.2 mg/dL 0.4 0.6 -  Alkaline Phos 38 - 126 U/L 74 91 -  AST 15 - 41 U/L 10(L) 14(L) -  ALT 0 - 44 U/L 7 12 -    Lab Results  Component Value Date   MPROTEIN 0.2 (H) 10/21/2021   MPROTEIN 0.2 (H) 09/16/2021   MPROTEIN 0.2 (H) 08/18/2021   Lab Results  Component Value Date   KPAFRELGTCHN 43.4 (H) 10/21/2021   KPAFRELGTCHN 29.1 (H) 09/16/2021   KPAFRELGTCHN 43.1 (H) 08/18/2021   LAMBDASER 73.0 (H) 10/21/2021   LAMBDASER 3.8 (L) 09/16/2021   LAMBDASER 5.8 08/18/2021   KAPLAMBRATIO 0.59 10/21/2021   KAPLAMBRATIO 7.66 (H) 09/16/2021  KAPLAMBRATIO 7.43 (H) 08/18/2021    RADIOGRAPHIC STUDIES: No results found.  ASSESSMENT & PLAN Connor Solomon 61 y.o. male with medical history significant for Waldenstrom's macroglobulinemia who presents for a follow up visit.   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  There is no clear evidence of hyperviscosity syndrome at this time.  At this time we will plan to proceed with ibrutinib and rituximab therapy.  One could also consider Bendamustine and rituximab therapy but given the patient's degree of anemia I would prefer pursuing ibrutinib and rituximab at this time.  Additionally ibrutinib/rituximab are category 1 recommendations per the NCCN guidelines.  The treatment will consist of ibrutinib 420 mg p.o. daily with rituximab on weeks 1 through 4 as well as weeks 27 through 30.  Previously we discussed the risks and benefits of therapy including (but not limited to) risk of bleeding, fatigue, atypical infections, and cardiac arrhythmias.  The patient voices understanding of this plan moving forward.  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 --will order monthly SPEP, SFLC, and UPEP --labs today were reviewed without any intervention needed. Pending SPEP and sFLC levels.  --plan for next round of rituximab to start on Dec 04, 2021. Continued weekly x 4 weeks.   --RTC in 4 weeks to assure he is improving  # Thumb Lesion -- Consistent with superficial infection on the palmar surface of the thumb. --We will prescribe a 10-day course of  doxycycline in order to help treat this.  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 --There does appear to be a component of iron deficiency anemia as well.  He is not taking oral iron at this time. We will repeat oral iron levels at next visit.  --Continue to monitor.  #Nausea and vomiting -- Concern that the nausea and vomiting may not be related to the ibrutinib therapy and potentially tied to poor gastric motility due to opioid prescription.  We will add metoclopramide to see if this helps with gastric motility and decreased nausea and vomiting --Unable to tolerate olanzopine so discontinued --Will attempt metoclopramide 10 mg every 8 hours as needed. --continue zofran scheduled q 8 hours to minimize episodes.   #Supportive Care -- chemotherapy education complete -- port placement not required -- allopurinol 370m PO daily for TLS prophylaxis. Will d/c after next round of rituximab  No orders of the defined types were placed in this encounter.   All questions were answered. The patient knows to call the clinic with any problems, questions or concerns.  I have spent a total of 30 minutes minutes of face-to-face and non-face-to-face time, preparing to see the patient, obtaining and/or reviewing separately obtained history, performing a medically appropriate examination, counseling and educating the patient, documenting clinical information in the electronic health record, and care coordination.    JLedell Peoples MD Department of Hematology/Oncology [place]at [facility]Pres[facility] HospitalPhone: [phone]: [phone]: [email or web address]  11/24/2021 10:22 AM  Dimopoulos MA, TRennie Natter Trotman J, GBasilia Jumbo Leblond V, Mahe B, Herbaux C, Tam C, Orsucci L, Palomba ML, Matous JV, SMoweaquaC, Kastritis  E, Treon SP, Gillie Manners, Salman Z, Graef T, Buske C; iNNOVATE Study Group and the [employer] for [employer]. Phase 3 Trial of Ibrutinib plus Rituximab in Waldenstrm's Macroglobulinemia. Alison Stalling J Med. 2018 Jun 21;378(25):2399-2410.   --At 30 months, the progression-free survival rate was 82% with ibrutinib-rituximab versus 28% with placebo-rituximab (hazard ratio for progression or death, 0.20; P<0.001).

## 2021-11-25 ENCOUNTER — Telehealth: Payer: Self-pay | Admitting: Hematology and Oncology

## 2021-11-25 LAB — KAPPA/LAMBDA LIGHT CHAINS
Kappa free light chain: 40.6 mg/L — ABNORMAL HIGH (ref 3.3–19.4)
Kappa, lambda light chain ratio: 10.41 — ABNORMAL HIGH (ref 0.26–1.65)
Lambda free light chains: 3.9 mg/L — ABNORMAL LOW (ref 5.7–26.3)

## 2021-11-25 NOTE — Telephone Encounter (Signed)
Scheduled per 2/14 los, pt has been called and confirmed next appt, said they will get calender at their next appt

## 2021-11-27 LAB — MULTIPLE MYELOMA PANEL, SERUM
Albumin SerPl Elph-Mcnc: 3.7 g/dL (ref 2.9–4.4)
Albumin/Glob SerPl: 1.9 — ABNORMAL HIGH (ref 0.7–1.7)
Alpha 1: 0.2 g/dL (ref 0.0–0.4)
Alpha2 Glob SerPl Elph-Mcnc: 0.6 g/dL (ref 0.4–1.0)
B-Globulin SerPl Elph-Mcnc: 0.8 g/dL (ref 0.7–1.3)
Gamma Glob SerPl Elph-Mcnc: 0.4 g/dL (ref 0.4–1.8)
Globulin, Total: 2 g/dL — ABNORMAL LOW (ref 2.2–3.9)
IgA: 12 mg/dL — ABNORMAL LOW (ref 90–386)
IgG (Immunoglobin G), Serum: 233 mg/dL — ABNORMAL LOW (ref 603–1613)
IgM (Immunoglobulin M), Srm: 212 mg/dL — ABNORMAL HIGH (ref 20–172)
M Protein SerPl Elph-Mcnc: 0.2 g/dL — ABNORMAL HIGH
Total Protein ELP: 5.7 g/dL — ABNORMAL LOW (ref 6.0–8.5)

## 2021-11-29 ENCOUNTER — Other Ambulatory Visit: Payer: Self-pay | Admitting: Hematology and Oncology

## 2021-11-29 MED ORDER — ONDANSETRON HCL 8 MG PO TABS
8.0000 mg | ORAL_TABLET | Freq: Three times a day (TID) | ORAL | 0 refills | Status: DC | PRN
  Filled 2021-11-29: qty 30, 10d supply, fill #0

## 2021-11-30 ENCOUNTER — Other Ambulatory Visit (HOSPITAL_COMMUNITY): Payer: Self-pay

## 2021-12-02 ENCOUNTER — Inpatient Hospital Stay: Payer: Medicaid Other

## 2021-12-02 ENCOUNTER — Other Ambulatory Visit: Payer: Self-pay

## 2021-12-02 ENCOUNTER — Other Ambulatory Visit (HOSPITAL_COMMUNITY): Payer: Self-pay

## 2021-12-02 VITALS — BP 127/74 | HR 60 | Temp 97.8°F | Resp 16 | Wt 151.2 lb

## 2021-12-02 DIAGNOSIS — C880 Waldenstrom macroglobulinemia: Secondary | ICD-10-CM

## 2021-12-02 DIAGNOSIS — Z8249 Family history of ischemic heart disease and other diseases of the circulatory system: Secondary | ICD-10-CM | POA: Diagnosis not present

## 2021-12-02 DIAGNOSIS — D472 Monoclonal gammopathy: Secondary | ICD-10-CM | POA: Diagnosis not present

## 2021-12-02 DIAGNOSIS — R634 Abnormal weight loss: Secondary | ICD-10-CM | POA: Diagnosis not present

## 2021-12-02 DIAGNOSIS — Z79899 Other long term (current) drug therapy: Secondary | ICD-10-CM | POA: Diagnosis not present

## 2021-12-02 DIAGNOSIS — I10 Essential (primary) hypertension: Secondary | ICD-10-CM | POA: Diagnosis not present

## 2021-12-02 DIAGNOSIS — Z5112 Encounter for antineoplastic immunotherapy: Secondary | ICD-10-CM | POA: Diagnosis not present

## 2021-12-02 DIAGNOSIS — Z596 Low income: Secondary | ICD-10-CM | POA: Diagnosis not present

## 2021-12-02 DIAGNOSIS — M1990 Unspecified osteoarthritis, unspecified site: Secondary | ICD-10-CM | POA: Diagnosis not present

## 2021-12-02 DIAGNOSIS — D649 Anemia, unspecified: Secondary | ICD-10-CM | POA: Diagnosis not present

## 2021-12-02 DIAGNOSIS — R112 Nausea with vomiting, unspecified: Secondary | ICD-10-CM | POA: Diagnosis not present

## 2021-12-02 DIAGNOSIS — Z87891 Personal history of nicotine dependence: Secondary | ICD-10-CM | POA: Diagnosis not present

## 2021-12-02 LAB — CMP (CANCER CENTER ONLY)
ALT: 7 U/L (ref 0–44)
AST: 14 U/L — ABNORMAL LOW (ref 15–41)
Albumin: 3.9 g/dL (ref 3.5–5.0)
Alkaline Phosphatase: 74 U/L (ref 38–126)
Anion gap: 7 (ref 5–15)
BUN: 7 mg/dL (ref 6–20)
CO2: 24 mmol/L (ref 22–32)
Calcium: 8.8 mg/dL — ABNORMAL LOW (ref 8.9–10.3)
Chloride: 108 mmol/L (ref 98–111)
Creatinine: 0.8 mg/dL (ref 0.61–1.24)
GFR, Estimated: >60 mL/min (ref >60)
Glucose, Bld: 129 mg/dL — ABNORMAL HIGH (ref 70–99)
Potassium: 3.7 mmol/L (ref 3.5–5.1)
Sodium: 139 mmol/L (ref 135–145)
Total Bilirubin: 0.7 mg/dL (ref 0.3–1.2)
Total Protein: 5.9 g/dL — ABNORMAL LOW (ref 6.5–8.1)

## 2021-12-02 LAB — CBC WITH DIFFERENTIAL (CANCER CENTER ONLY)
Abs Immature Granulocytes: 0.01 10*3/uL (ref 0.00–0.07)
Basophils Absolute: 0 10*3/uL (ref 0.0–0.1)
Basophils Relative: 1 %
Eosinophils Absolute: 0.5 10*3/uL (ref 0.0–0.5)
Eosinophils Relative: 9 %
HCT: 37.6 % — ABNORMAL LOW (ref 39.0–52.0)
Hemoglobin: 12.4 g/dL — ABNORMAL LOW (ref 13.0–17.0)
Immature Granulocytes: 0 %
Lymphocytes Relative: 16 %
Lymphs Abs: 1 10*3/uL (ref 0.7–4.0)
MCH: 27.5 pg (ref 26.0–34.0)
MCHC: 33 g/dL (ref 30.0–36.0)
MCV: 83.4 fL (ref 80.0–100.0)
Monocytes Absolute: 0.4 10*3/uL (ref 0.1–1.0)
Monocytes Relative: 6 %
Neutro Abs: 4.1 10*3/uL (ref 1.7–7.7)
Neutrophils Relative %: 68 %
Platelet Count: 234 10*3/uL (ref 150–400)
RBC: 4.51 MIL/uL (ref 4.22–5.81)
RDW: 14.7 % (ref 11.5–15.5)
WBC Count: 6 10*3/uL (ref 4.0–10.5)
nRBC: 0 % (ref 0.0–0.2)

## 2021-12-02 LAB — LACTATE DEHYDROGENASE: LDH: 160 U/L (ref 98–192)

## 2021-12-02 MED ORDER — FAMOTIDINE IN NACL 20-0.9 MG/50ML-% IV SOLN
20.0000 mg | Freq: Once | INTRAVENOUS | Status: AC
  Administered 2021-12-02: 20 mg via INTRAVENOUS
  Filled 2021-12-02: qty 50

## 2021-12-02 MED ORDER — MEPERIDINE HCL 25 MG/ML IJ SOLN
25.0000 mg | Freq: Once | INTRAMUSCULAR | Status: AC
  Administered 2021-12-02: 25 mg via INTRAVENOUS
  Filled 2021-12-02: qty 1

## 2021-12-02 MED ORDER — METHYLPREDNISOLONE SODIUM SUCC 125 MG IJ SOLR
80.0000 mg | Freq: Once | INTRAMUSCULAR | Status: AC
  Administered 2021-12-02: 80 mg via INTRAVENOUS
  Filled 2021-12-02: qty 2

## 2021-12-02 MED ORDER — SODIUM CHLORIDE 0.9 % IV SOLN
375.0000 mg/m2 | Freq: Once | INTRAVENOUS | Status: AC
  Administered 2021-12-02: 700 mg via INTRAVENOUS
  Filled 2021-12-02: qty 50

## 2021-12-02 MED ORDER — SODIUM CHLORIDE 0.9 % IV SOLN: Freq: Once | INTRAVENOUS | Status: AC

## 2021-12-02 MED ORDER — ACETAMINOPHEN 325 MG PO TABS
650.0000 mg | ORAL_TABLET | Freq: Once | ORAL | Status: AC
  Administered 2021-12-02: 650 mg via ORAL
  Filled 2021-12-02: qty 2

## 2021-12-02 MED ORDER — DIPHENHYDRAMINE HCL 25 MG PO CAPS
50.0000 mg | ORAL_CAPSULE | Freq: Once | ORAL | Status: AC
  Administered 2021-12-02: 50 mg via ORAL
  Filled 2021-12-02: qty 2

## 2021-12-02 NOTE — Patient Instructions (Signed)
[facility]  Discharge Instructions: Thank you for choosing [facility] to provide your oncology and hematology care.   If you have a lab appointment with the [facility], please go directly to the [facility] and check in at the registration area.   Wear comfortable clothing and clothing appropriate for easy access to any Portacath or PICC line.   We strive to give you quality time with your provider. You may need to reschedule your appointment if you arrive late (15 or more minutes).  Arriving late affects you and other patients whose appointments are after yours.  Also, if you miss three or more appointments without notifying the office, you may be dismissed from the clinic at the providers discretion.      For prescription refill requests, have your pharmacy contact our office and allow 72 hours for refills to be completed.    Today you received the following chemotherapy and/or immunotherapy agents rituxan      To help prevent nausea and vomiting after your treatment, we encourage you to take your nausea medication as directed.  BELOW ARE SYMPTOMS THAT SHOULD BE REPORTED IMMEDIATELY: *FEVER GREATER THAN 100.4 F (38 C) OR HIGHER *CHILLS OR SWEATING *NAUSEA AND VOMITING THAT IS NOT CONTROLLED WITH YOUR NAUSEA MEDICATION *UNUSUAL SHORTNESS OF BREATH *UNUSUAL BRUISING OR BLEEDING *URINARY PROBLEMS (pain or burning when urinating, or frequent urination) *BOWEL PROBLEMS (unusual diarrhea, constipation, pain near the anus) TENDERNESS IN MOUTH AND THROAT WITH OR WITHOUT PRESENCE OF ULCERS (sore throat, sores in mouth, or a toothache) UNUSUAL RASH, SWELLING OR PAIN  UNUSUAL VAGINAL DISCHARGE OR ITCHING   Items with * indicate a potential emergency and should be followed up as soon as possible or go to the Emergency Department if any problems should occur.  Please show the CHEMOTHERAPY ALERT CARD or IMMUNOTHERAPY ALERT CARD at check-in to the  Emergency Department and triage nurse.  Should you have questions after your visit or need to cancel or reschedule your appointment, please contact [facility]  Dept: [phone]  and follow the prompts.  Office hours are 8:00 a.m. to 4:30 p.m. Monday - Friday. Please note that voicemails left after 4:00 p.m. may not be returned until the following business day.  We are closed weekends and major holidays. You have access to a nurse at all times for urgent questions. Please call the main number to the clinic Dept: [phone] and follow the prompts.   For any non-urgent questions, you may also contact your provider using MyChart. We now offer e-Visits for anyone 30 and older to request care online for non-urgent symptoms. For details visit [email or web address].   Also download the MyChart app! Go to the app store, search "MyChart", open the app, select [facility], and log in with your MyChart username and password.  Due to Covid, a mask is required upon entering the hospital/clinic. If you do not have a mask, one will be given to you upon arrival. For doctor visits, patients may have 1 support person aged 30 or older with them. For treatment visits, patients cannot have anyone with them due to current Covid guidelines and our immunocompromised population.

## 2021-12-03 ENCOUNTER — Other Ambulatory Visit (HOSPITAL_COMMUNITY): Payer: Self-pay

## 2021-12-03 LAB — KAPPA/LAMBDA LIGHT CHAINS
Kappa free light chain: 37.1 mg/L — ABNORMAL HIGH (ref 3.3–19.4)
Kappa, lambda light chain ratio: 10.6 — ABNORMAL HIGH (ref 0.26–1.65)
Lambda free light chains: 3.5 mg/L — ABNORMAL LOW (ref 5.7–26.3)

## 2021-12-04 ENCOUNTER — Other Ambulatory Visit (HOSPITAL_COMMUNITY): Payer: Self-pay

## 2021-12-04 ENCOUNTER — Other Ambulatory Visit: Payer: Self-pay | Admitting: Hematology and Oncology

## 2021-12-04 DIAGNOSIS — N62 Hypertrophy of breast: Secondary | ICD-10-CM

## 2021-12-05 ENCOUNTER — Other Ambulatory Visit (HOSPITAL_COMMUNITY): Payer: Self-pay

## 2021-12-07 ENCOUNTER — Other Ambulatory Visit (HOSPITAL_COMMUNITY): Payer: Self-pay

## 2021-12-07 ENCOUNTER — Encounter: Payer: Self-pay | Admitting: Hematology and Oncology

## 2021-12-07 LAB — MULTIPLE MYELOMA PANEL, SERUM
Albumin SerPl Elph-Mcnc: 3.5 g/dL (ref 2.9–4.4)
Albumin/Glob SerPl: 1.8 — ABNORMAL HIGH (ref 0.7–1.7)
Alpha 1: 0.3 g/dL (ref 0.0–0.4)
Alpha2 Glob SerPl Elph-Mcnc: 0.6 g/dL (ref 0.4–1.0)
B-Globulin SerPl Elph-Mcnc: 0.8 g/dL (ref 0.7–1.3)
Gamma Glob SerPl Elph-Mcnc: 0.3 g/dL — ABNORMAL LOW (ref 0.4–1.8)
Globulin, Total: 2 g/dL — ABNORMAL LOW (ref 2.2–3.9)
IgA: 11 mg/dL — ABNORMAL LOW (ref 90–386)
IgG (Immunoglobin G), Serum: 190 mg/dL — ABNORMAL LOW (ref 603–1613)
IgM (Immunoglobulin M), Srm: 180 mg/dL — ABNORMAL HIGH (ref 20–172)
M Protein SerPl Elph-Mcnc: 0.1 g/dL — ABNORMAL HIGH
Total Protein ELP: 5.5 g/dL — ABNORMAL LOW (ref 6.0–8.5)

## 2021-12-07 MED ORDER — OXYCODONE-ACETAMINOPHEN 5-325 MG PO TABS
1.0000 | ORAL_TABLET | Freq: Four times a day (QID) | ORAL | 0 refills | Status: DC | PRN
  Filled 2021-12-07: qty 60, 15d supply, fill #0

## 2021-12-08 ENCOUNTER — Other Ambulatory Visit (HOSPITAL_COMMUNITY): Payer: Self-pay

## 2021-12-09 ENCOUNTER — Inpatient Hospital Stay: Payer: Medicaid Other | Admitting: Dietician

## 2021-12-09 ENCOUNTER — Other Ambulatory Visit: Payer: Self-pay

## 2021-12-09 ENCOUNTER — Inpatient Hospital Stay: Payer: Medicaid Other | Attending: Hematology and Oncology

## 2021-12-09 ENCOUNTER — Inpatient Hospital Stay: Payer: Medicaid Other

## 2021-12-09 VITALS — BP 130/79 | HR 65 | Temp 97.5°F | Resp 16 | Wt 145.8 lb

## 2021-12-09 DIAGNOSIS — I10 Essential (primary) hypertension: Secondary | ICD-10-CM | POA: Insufficient documentation

## 2021-12-09 DIAGNOSIS — R5383 Other fatigue: Secondary | ICD-10-CM | POA: Insufficient documentation

## 2021-12-09 DIAGNOSIS — D472 Monoclonal gammopathy: Secondary | ICD-10-CM | POA: Diagnosis not present

## 2021-12-09 DIAGNOSIS — Z8249 Family history of ischemic heart disease and other diseases of the circulatory system: Secondary | ICD-10-CM | POA: Diagnosis not present

## 2021-12-09 DIAGNOSIS — Z87891 Personal history of nicotine dependence: Secondary | ICD-10-CM | POA: Diagnosis not present

## 2021-12-09 DIAGNOSIS — C880 Waldenstrom macroglobulinemia: Secondary | ICD-10-CM | POA: Diagnosis not present

## 2021-12-09 DIAGNOSIS — Z5112 Encounter for antineoplastic immunotherapy: Secondary | ICD-10-CM | POA: Insufficient documentation

## 2021-12-09 DIAGNOSIS — Z79899 Other long term (current) drug therapy: Secondary | ICD-10-CM | POA: Insufficient documentation

## 2021-12-09 DIAGNOSIS — R630 Anorexia: Secondary | ICD-10-CM | POA: Diagnosis not present

## 2021-12-09 DIAGNOSIS — R112 Nausea with vomiting, unspecified: Secondary | ICD-10-CM | POA: Insufficient documentation

## 2021-12-09 DIAGNOSIS — Z419 Encounter for procedure for purposes other than remedying health state, unspecified: Secondary | ICD-10-CM | POA: Diagnosis not present

## 2021-12-09 LAB — CMP (CANCER CENTER ONLY)
ALT: 9 U/L (ref 0–44)
AST: 14 U/L — ABNORMAL LOW (ref 15–41)
Albumin: 3.6 g/dL (ref 3.5–5.0)
Alkaline Phosphatase: 76 U/L (ref 38–126)
Anion gap: 7 (ref 5–15)
BUN: 8 mg/dL (ref 6–20)
CO2: 24 mmol/L (ref 22–32)
Calcium: 8 mg/dL — ABNORMAL LOW (ref 8.9–10.3)
Chloride: 107 mmol/L (ref 98–111)
Creatinine: 0.84 mg/dL (ref 0.61–1.24)
GFR, Estimated: >60 mL/min (ref >60)
Glucose, Bld: 107 mg/dL — ABNORMAL HIGH (ref 70–99)
Potassium: 3.3 mmol/L — ABNORMAL LOW (ref 3.5–5.1)
Sodium: 138 mmol/L (ref 135–145)
Total Bilirubin: 0.4 mg/dL (ref 0.3–1.2)
Total Protein: 5.5 g/dL — ABNORMAL LOW (ref 6.5–8.1)

## 2021-12-09 LAB — CBC WITH DIFFERENTIAL (CANCER CENTER ONLY)
Abs Immature Granulocytes: 0.03 10*3/uL (ref 0.00–0.07)
Basophils Absolute: 0 10*3/uL (ref 0.0–0.1)
Basophils Relative: 1 %
Eosinophils Absolute: 0.4 10*3/uL (ref 0.0–0.5)
Eosinophils Relative: 8 %
HCT: 34.1 % — ABNORMAL LOW (ref 39.0–52.0)
Hemoglobin: 11.5 g/dL — ABNORMAL LOW (ref 13.0–17.0)
Immature Granulocytes: 1 %
Lymphocytes Relative: 25 %
Lymphs Abs: 1.2 10*3/uL (ref 0.7–4.0)
MCH: 28.1 pg (ref 26.0–34.0)
MCHC: 33.7 g/dL (ref 30.0–36.0)
MCV: 83.4 fL (ref 80.0–100.0)
Monocytes Absolute: 0.6 10*3/uL (ref 0.1–1.0)
Monocytes Relative: 13 %
Neutro Abs: 2.7 10*3/uL (ref 1.7–7.7)
Neutrophils Relative %: 52 %
Platelet Count: 255 10*3/uL (ref 150–400)
RBC: 4.09 MIL/uL — ABNORMAL LOW (ref 4.22–5.81)
RDW: 14.6 % (ref 11.5–15.5)
WBC Count: 5 10*3/uL (ref 4.0–10.5)
nRBC: 0 % (ref 0.0–0.2)

## 2021-12-09 LAB — LACTATE DEHYDROGENASE: LDH: 172 U/L (ref 98–192)

## 2021-12-09 MED ORDER — METHYLPREDNISOLONE SODIUM SUCC 125 MG IJ SOLR
80.0000 mg | Freq: Once | INTRAMUSCULAR | Status: AC
  Administered 2021-12-09: 80 mg via INTRAVENOUS
  Filled 2021-12-09: qty 2

## 2021-12-09 MED ORDER — MEPERIDINE HCL 25 MG/ML IJ SOLN
25.0000 mg | Freq: Once | INTRAMUSCULAR | Status: AC
  Administered 2021-12-09: 25 mg via INTRAVENOUS
  Filled 2021-12-09: qty 1

## 2021-12-09 MED ORDER — DIPHENHYDRAMINE HCL 25 MG PO CAPS
50.0000 mg | ORAL_CAPSULE | Freq: Once | ORAL | Status: AC
  Administered 2021-12-09: 50 mg via ORAL
  Filled 2021-12-09: qty 2

## 2021-12-09 MED ORDER — FAMOTIDINE IN NACL 20-0.9 MG/50ML-% IV SOLN
20.0000 mg | Freq: Once | INTRAVENOUS | Status: AC
  Administered 2021-12-09: 20 mg via INTRAVENOUS
  Filled 2021-12-09: qty 50

## 2021-12-09 MED ORDER — SODIUM CHLORIDE 0.9 % IV SOLN
375.0000 mg/m2 | Freq: Once | INTRAVENOUS | Status: AC
  Administered 2021-12-09: 700 mg via INTRAVENOUS
  Filled 2021-12-09: qty 50

## 2021-12-09 MED ORDER — ACETAMINOPHEN 325 MG PO TABS
650.0000 mg | ORAL_TABLET | Freq: Once | ORAL | Status: AC
  Administered 2021-12-09: 650 mg via ORAL
  Filled 2021-12-09: qty 2

## 2021-12-09 MED ORDER — SODIUM CHLORIDE 0.9 % IV SOLN: Freq: Once | INTRAVENOUS | Status: AC

## 2021-12-09 NOTE — Progress Notes (Signed)
Nutrition Assessment ? ? ?Reason for Assessment: MST (+wt loss) ? ? ?ASSESSMENT: 61 year old male with Waldenstrom macroglobulinemia. He is receiving ibrutinib + rituximab. Patient is under the care of Dr. Lorenso Courier.  ? ?Met with patient during infusion. Patient reports poor appetite, altered taste, ongoing nausea with episodes of vomiting and fatigue. Patient reports he has good days and bad days. Recently, pt has been eating better. He is taking zofran as prescribed. Patient reports taking another medication as needed if feeling nauseas after eating. He does not recall what this is. Patient reports it has helped more than other medications he has tried, but it makes him "feel funny" after taking. Patient is eating small amounts of food at a time. His wife is a Systems developer. Patient ate steak, potato with butter, cheese, sour cream. He reports episode of vomiting after eating small amount. Patient was able to finish the rest of meal later. Food taste like cardboard, but he can pick up on sweet foods well. He likes to eat chocolate caramel candy sometimes. Patient drinking one Ensure Original, he would drink more but can not afford to. Patient is asking about medical marijuana or CBD to help improve his appetite and nausea/vomiting.  ? ? ?Nutrition Focused Physical Exam:  ?Moderate fat depletion - orbital, buccal ?Mild muscle depletion - temple ?Moderate muscle depletion - clavicle, clavicle and acromion  ? ? ?Medications: Lasix, Oxycodone, Enestro, Zyloprim, Coreg, Reglan ? ? ?Labs: K 3.3, Glucose 107, AST 14 ? ? ?Anthropometrics: Weights have decreased 6 lbs from 151 lb 4 oz on 2/22; significant 4% in 7 days ? ?Height: 5'8" ?Weight: 145 lb 12 oz  ?UBW: 152-153 lb (Oct 2022) ?BMI: 22.16 ? ? ?NUTRITION DIAGNOSIS: Inadequate oral intake related to cancer and associated side effects of chemotherapy as evidenced by reported altered taste, poor appetite, chronic nausea with frequent episodes of vomiting, significant 4%  decrease in weight in 7 days. ? ? ?INTERVENTION:  ?Educated on small frequent meals and snacks with adequate calories and protein - handout with ideas and shake recipes provided ?Discussed ideas on ways to add calories/protein to foods, making the most of every bite (adding cheese to foods, switching to 2%/whole milk, cooking oatmeal and cream-based soups with milk vs water, adding butter and gravy to foods) ?Discussed tips for nausea, foods to limit and foods best tolerated - handout provided ?Discussed strategies for altered taste - handout provided ?Recommend baking soda salt water rinses several times daily - recipe provided ?Patient agreeable to eating bedtime snack ?Continue drinking Ensure, suggested switching to Ensure Plus for added calories/protein, recommend 2/day ?One complimentary case of Ensure Plus High Protein provided today  ?Patient will speak with MD regarding use of CBD products  ?Contact information provided  ? ?MONITORING, EVALUATION, GOAL: Patient will tolerate increased calories and protein to promote stable weight/gain ? ? ?Next Visit: Wednesday March 15 during infusion  ? ? ? ? ? ?

## 2021-12-09 NOTE — Patient Instructions (Signed)
[facility]  Discharge Instructions: ?Thank you for choosing [facility] to provide your oncology and hematology care.  ? ?If you have a lab appointment with the [facility], please go directly to the [facility] and check in at the registration area. ?  ?Wear comfortable clothing and clothing appropriate for easy access to any Portacath or PICC line.  ? ?We strive to give you quality time with your provider. You may need to reschedule your appointment if you arrive late (15 or more minutes).  Arriving late affects you and other patients whose appointments are after yours.  Also, if you miss three or more appointments without notifying the office, you may be dismissed from the clinic at the provider?s discretion.    ?  ?For prescription refill requests, have your pharmacy contact our office and allow 72 hours for refills to be completed.   ? ?Today you received the following chemotherapy and/or immunotherapy agents: Ruxience ?  ?To help prevent nausea and vomiting after your treatment, we encourage you to take your nausea medication as directed. ? ?BELOW ARE SYMPTOMS THAT SHOULD BE REPORTED IMMEDIATELY: ?*FEVER GREATER THAN 100.4 F (38 ?C) OR HIGHER ?*CHILLS OR SWEATING ?*NAUSEA AND VOMITING THAT IS NOT CONTROLLED WITH YOUR NAUSEA MEDICATION ?*UNUSUAL SHORTNESS OF BREATH ?*UNUSUAL BRUISING OR BLEEDING ?*URINARY PROBLEMS (pain or burning when urinating, or frequent urination) ?*BOWEL PROBLEMS (unusual diarrhea, constipation, pain near the anus) ?TENDERNESS IN MOUTH AND THROAT WITH OR WITHOUT PRESENCE OF ULCERS (sore throat, sores in mouth, or a toothache) ?UNUSUAL RASH, SWELLING OR PAIN  ?UNUSUAL VAGINAL DISCHARGE OR ITCHING  ? ?Items with * indicate a potential emergency and should be followed up as soon as possible or go to the Emergency Department if any problems should occur. ? ?Please show the CHEMOTHERAPY ALERT CARD or IMMUNOTHERAPY ALERT CARD at check-in to the  Emergency Department and triage nurse. ? ?Should you have questions after your visit or need to cancel or reschedule your appointment, please contact [facility]  Dept: [phone]  and follow the prompts.  Office hours are 8:00 a.m. to 4:30 p.m. Monday - Friday. Please note that voicemails left after 4:00 p.m. may not be returned until the following business day.  We are closed weekends and major holidays. You have access to a nurse at all times for urgent questions. Please call the main number to the clinic Dept: [phone] and follow the prompts. ? ? ?For any non-urgent questions, you may also contact your provider using MyChart. We now offer e-Visits for anyone 60 and older to request care online for non-urgent symptoms. For details visit [email or web address]. ?  ?Also download the MyChart app! Go to the app store, search "MyChart", open the app, select [facility], and log in with your MyChart username and password. ? ?Due to Covid, a mask is required upon entering the hospital/clinic. If you do not have a mask, one will be given to you upon arrival. For doctor visits, patients may have 1 support person aged 99 or older with them. For treatment visits, patients cannot have anyone with them due to current Covid guidelines and our immunocompromised population.  ? ?

## 2021-12-10 ENCOUNTER — Telehealth: Payer: Self-pay

## 2021-12-10 LAB — KAPPA/LAMBDA LIGHT CHAINS
Kappa free light chain: 28.2 mg/L — ABNORMAL HIGH (ref 3.3–19.4)
Kappa, lambda light chain ratio: 7.42 — ABNORMAL HIGH (ref 0.26–1.65)
Lambda free light chains: 3.8 mg/L — ABNORMAL LOW (ref 5.7–26.3)

## 2021-12-10 NOTE — Telephone Encounter (Signed)
Connor Solomon states that he woke up today and felt the best he has felt in a while.  He has an appetite. Doing well. ?He is drinking and urinating well. ?He knows to cal the office at [phone] if he has any questions or concerns. ?

## 2021-12-10 NOTE — Telephone Encounter (Signed)
-----   Message from Anastasia Pall, RN sent at 12/09/2021  3:30 PM EST ----- ?Regarding: 1st time Ruxience, being seen by Dr. Lorenso Courier ?Patient received 1st time ruxience today, changed from rituxan due to insurance coverage changes. Patient tolerated treatment well. Please follow-up tomorrow. Thank you. ? ?

## 2021-12-11 NOTE — Progress Notes (Incomplete)
?Advanced Heart Failure Team Clinic Note ?PCP: Juluis Mire ?HF Cardiologist: Dr. Haroldine Laws ? ?HPI: ? ?Connor Solomon is a 61 y.o. WM w/ h/o HTN, new IDA and new systolic HF.  ? ?Pt had stress echo in 2010 for CP showing no stress arrhythmias or conduction abnormalities. The stress ECG was negative for ischemia. There was no echocardiographic evidence for stress-induced ischemia. EF normal. In 2011, he had syncope and evaluated by Dr. Harrington Challenger but this was felt to be vasovagal syncope. No further cardiac w/u.  ? ?Admitted 04/07/21 where he was found to be in acute CHF and 20 pound weight loss. CXR suspicious for bronchopneumonia. Also could not r/o underlying malignancy. CT + for moderate bilateral pleural effusions and patchy airspace opacities consistent with multifocal pneumonia. He was found to be anemic w/ hgb 8.6.  ? ?Echo showed severely reduced LVEF, 20-25% w/ glogal HK, Grade III DD (restrictive), RV normal. Mod-severe MR. No LVH. 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 ESR 142, autoimmune workup ensued. cMRI consistent with NICM, EF 25%.  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 Discharged on spiro, carvedilol and losartan (bp too soft for Entresto). ? ?Seen by Dr. Lorenso Courier with Heme/Onc. 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 Started Ibrutinib + Rituximab 06/05/21.  ? ?Today he returns for HF follow up. Says he tries to stay active but remains very fatigued. Can do ADLs but gets fatigued with anything else. Says when he tries to do more he gets joint pain/swelling for several days. Has to use a walker sometimes. Gets winded doing housework. No edema, orthopnea or PND. Compliant with meds. BP has dropped a few times. Smoking 1 black & tan a few days a week.  ? ? ?Echo today (09/14/21): EF 25% RV ok Personally reviewed ? ? ?Cardiac Studies:  ? ?-  R/LHC (6/22): Normal cors, severe MICM EF 25%, low filling pressures with normal CO. ?  ?Ao =98/68 (82) ?LV = 81/3 ?RA = 1 ?RV = 27/2 ?PA = 25/8 (16) ?PCW = 10 ?Fick cardiac output/index = 5.8/3.2 ?PVR = 1.0 WU ?Ao sat = 100% ?PA sat = 66%, 70% ?  ?- Echo (6/22): EF 20-25%, Grade III DD, RV ok, mod/severe MR ? ?ROS: All systems negative except as listed in HPI, PMH and Problem List. ? ?SH:  ?Social History  ? ?Socioeconomic History  ? Marital status: Single  ?  Spouse name: Not on file  ? Number of children: Not on file  ? Years of education: Not on file  ? Highest education level: Not on file  ?Occupational History  ? Not on file  ?Tobacco Use  ? Smoking status: Former  ?  Types: Cigars  ?  Quit date: 10/11/2020  ?  Years since quitting: 1.1  ? Smokeless tobacco: Never  ? Tobacco comments:  ?  smokes 1 cigar a week  ?Substance and Sexual Activity  ? Alcohol use: No  ? Drug use: No  ? Sexual activity: Not on file  ?Other Topics Concern  ? Not on file  ?Social History Narrative  ? Not on file  ? ?Social Determinants of Health  ? ?Financial Resource Strain: High Risk  ? Difficulty of Paying Living Expenses: Very hard  ?Food Insecurity: Food Insecurity Present  ? Worried About Charity fundraiser in the Last Year: Sometimes true  ? Ran Out of Food in the Last Year:  Sometimes true  ?Transportation Needs: No Transportation Needs  ? Lack of Transportation (Medical): No  ? Lack of Transportation (Non-Medical): No  ?Physical Activity: Not on file  ?Stress: Stress Concern Present  ? Feeling of Stress : Very much  ?Social Connections: Not on file  ?Intimate Partner Violence: Not on file  ? ?FH:  ?Family History  ?Problem Relation Age of Onset  ? Heart attack Father   ? Sudden Cardiac Death Father 71  ? Diabetes Neg Hx   ? Hyperlipidemia Neg Hx   ? Hypertension Neg Hx   ? ?Past Medical History:  ?Diagnosis Date  ? Acute heart failure ([place]) 04/08/2021  ? Acute HFrEF (heart failure with reduced ejection fraction) ([place]) 04/07/2021   ? Arthritis   ? hands, elbows bilaterally  ? Cancer [facility])   ? Full dentures   ? Hypertension   ? Pt states he doen not have HTN and has never been treated for HTN  ? Kidney stone on left side last stone 4-5 yrs ago  ? recurrent (3 episodes)  ? Panic disorder   ? pt states has resolved  ? Syncope   ? resolved- was related to panic attacks  ? ?Current Outpatient Medications  ?Medication Sig Dispense Refill  ? acetaminophen (TYLENOL) 500 MG tablet Take 500 mg by mouth every 6 (six) hours as needed.    ? albuterol (PROAIR HFA) 108 (90 Base) MCG/ACT inhaler Inhale 2 puffs into the lungs every 6 (six) hours as needed for wheezing or shortness of breath. 8.5 g 2  ? allopurinol (ZYLOPRIM) 300 MG tablet Take 1 tablet (300 mg total) by mouth daily. 30 tablet 0  ? carvedilol (COREG) 3.125 MG tablet Take 1 tablet by mouth 2 times daily. 68 tablet 2  ? empagliflozin (JARDIANCE) 10 MG TABS tablet Take 1 tablet by mouth daily before breakfast. 30 tablet 11  ? eplerenone (INSPRA) 25 MG tablet Take 1/2 tablet (12.5 mg total) by mouth daily. 15 tablet 11  ? furosemide (LASIX) 20 MG tablet Take 1 tablet (20 mg total) by mouth daily as needed for swelling 100 tablet 2  ? ibrutinib 420 MG TABS Take 420 mg by mouth daily. 30 tablet 2  ? metoCLOPramide (REGLAN) 10 MG tablet Take 1 tablet (10 mg total) by mouth every 8 (eight) hours as needed for nausea. 30 tablet 1  ? ondansetron (ZOFRAN) 8 MG tablet Take 1 tablet by mouth every 8 hours as needed. 30 tablet 0  ? oxyCODONE-acetaminophen (PERCOCET) 5-325 MG tablet Take 1 tablet by mouth every 6 hours as needed for severe pain. 60 tablet 0  ? sacubitril-valsartan (ENTRESTO) 24-26 MG Take 1 tablet by mouth 2 (two) times daily. 60 tablet 5  ? ?No current facility-administered medications for this visit.  ? ?There were no vitals taken for this visit. ? ?Wt Readings from Last 3 Encounters:  ?12/09/21 66.1 kg (145 lb 12 oz)  ?12/02/21 68.6 kg (151 lb 4 oz)  ?11/24/21 67.5 kg (148 lb 14.4 oz)   ? ?PHYSICAL EXAM: ?General:  Well appearing. No resp difficulty ?HEENT: normal ?Neck: supple. no JVD. Carotids 2+ bilat; no bruits. No lymphadenopathy or thryomegaly appreciated. ?Cor: PMI nondisplaced. Regular rate & rhythm. No rubs, gallops or murmurs. ?Lungs: clear ?Abdomen: soft, nontender, nondistended. No hepatosplenomegaly. No bruits or masses. Good bowel sounds. ?Extremities: no cyanosis, clubbing, rash, edema ?Neuro: alert & orientedx3, cranial nerves grossly intact. moves all 4 extremities w/o difficulty. Affect pleasant ? ? ?ASSESSMENT & PLAN:  ? ?1.  Chronic Systolic Heart Failure ?- Stress Echo 2010: normal EF. No evidence of ischemia ?- Echo 6/22: EF 20-25%, GIIDD (restrictive), no LVH. RV normal ?- ? Viral CM (COVID + 2021), ? [facility]am[facility] (FH of [facility]). TSH normal, HIV pending ?- LHC/RHC (6/22): with normal cors, low filling pressures, and normal cardiac output ?- Also ? Infiltrative CM. cMRI EF 25%, normal RV, Basal septal midwall LGE, mild MR.  ?- MM panel urine immunofixation: IgM monoclonal protein with kappa light chain  ?Specificity -> found to have Waldstrom's Macroglobulinemia ?- Echo today 09/14/21 EF 25% mild MR/AI  ?- Stable NYHA III,. Volume status ok. Takes lasix as needed. Unclear if majority of symptoms related to HF versus his malignancy (suspect the latter) ?- Continue eplerenone 12.5 mg daily (gynecomastia with spiro).  ?- Continue Entresto 24/26 mg bid.  ?- Continue carvedilol 3.125 mg bid. ?- Continue Jardiance 10 mg daily.  ?- BP too low to titrate GDMT ? ?2.  Mitral Regurgitation ?- Mod-severe, likely functional from dilated LV. ?- Mild on echo today  ?      ?3.  Waldenstrom's macroglobulinemia. ?- Now followed by Dr. Lorenso Courier with Heme/Onc ?- Completed 4 weeks of Rituximab. Now continued on daily ibrutinib. ? ?4. SDOH ?- He now has Medicaid and disability is pending. ? ?Rafael Bihari, FNP  ?1:26 PM ? ?

## 2021-12-14 ENCOUNTER — Encounter (HOSPITAL_COMMUNITY): Payer: Medicaid Other

## 2021-12-14 LAB — MULTIPLE MYELOMA PANEL, SERUM
Albumin SerPl Elph-Mcnc: 3.2 g/dL (ref 2.9–4.4)
Albumin/Glob SerPl: 1.7 (ref 0.7–1.7)
Alpha 1: 0.3 g/dL (ref 0.0–0.4)
Alpha2 Glob SerPl Elph-Mcnc: 0.6 g/dL (ref 0.4–1.0)
B-Globulin SerPl Elph-Mcnc: 0.7 g/dL (ref 0.7–1.3)
Gamma Glob SerPl Elph-Mcnc: 0.3 g/dL — ABNORMAL LOW (ref 0.4–1.8)
Globulin, Total: 1.9 g/dL — ABNORMAL LOW (ref 2.2–3.9)
IgA: 10 mg/dL — ABNORMAL LOW (ref 90–386)
IgG (Immunoglobin G), Serum: 196 mg/dL — ABNORMAL LOW (ref 603–1613)
IgM (Immunoglobulin M), Srm: 164 mg/dL (ref 20–172)
M Protein SerPl Elph-Mcnc: 0.1 g/dL — ABNORMAL HIGH
Total Protein ELP: 5.1 g/dL — ABNORMAL LOW (ref 6.0–8.5)

## 2021-12-16 ENCOUNTER — Other Ambulatory Visit (HOSPITAL_COMMUNITY): Payer: Self-pay

## 2021-12-16 ENCOUNTER — Other Ambulatory Visit: Payer: Self-pay | Admitting: Hematology and Oncology

## 2021-12-16 ENCOUNTER — Other Ambulatory Visit: Payer: Self-pay

## 2021-12-16 ENCOUNTER — Inpatient Hospital Stay: Payer: Medicaid Other

## 2021-12-16 VITALS — BP 160/66 | HR 61 | Temp 97.8°F | Resp 16 | Wt 149.8 lb

## 2021-12-16 DIAGNOSIS — Z5112 Encounter for antineoplastic immunotherapy: Secondary | ICD-10-CM | POA: Diagnosis not present

## 2021-12-16 DIAGNOSIS — R112 Nausea with vomiting, unspecified: Secondary | ICD-10-CM | POA: Diagnosis not present

## 2021-12-16 DIAGNOSIS — Z87891 Personal history of nicotine dependence: Secondary | ICD-10-CM | POA: Diagnosis not present

## 2021-12-16 DIAGNOSIS — C880 Waldenstrom macroglobulinemia: Secondary | ICD-10-CM

## 2021-12-16 DIAGNOSIS — Z8249 Family history of ischemic heart disease and other diseases of the circulatory system: Secondary | ICD-10-CM | POA: Diagnosis not present

## 2021-12-16 DIAGNOSIS — R630 Anorexia: Secondary | ICD-10-CM | POA: Diagnosis not present

## 2021-12-16 DIAGNOSIS — N62 Hypertrophy of breast: Secondary | ICD-10-CM

## 2021-12-16 DIAGNOSIS — D472 Monoclonal gammopathy: Secondary | ICD-10-CM | POA: Diagnosis not present

## 2021-12-16 DIAGNOSIS — Z79899 Other long term (current) drug therapy: Secondary | ICD-10-CM | POA: Diagnosis not present

## 2021-12-16 DIAGNOSIS — R5383 Other fatigue: Secondary | ICD-10-CM | POA: Diagnosis not present

## 2021-12-16 DIAGNOSIS — I10 Essential (primary) hypertension: Secondary | ICD-10-CM | POA: Diagnosis not present

## 2021-12-16 LAB — CBC WITH DIFFERENTIAL (CANCER CENTER ONLY)
Abs Immature Granulocytes: 0.02 10*3/uL (ref 0.00–0.07)
Basophils Absolute: 0 10*3/uL (ref 0.0–0.1)
Basophils Relative: 1 %
Eosinophils Absolute: 0.4 10*3/uL (ref 0.0–0.5)
Eosinophils Relative: 7 %
HCT: 35.5 % — ABNORMAL LOW (ref 39.0–52.0)
Hemoglobin: 11.6 g/dL — ABNORMAL LOW (ref 13.0–17.0)
Immature Granulocytes: 0 %
Lymphocytes Relative: 23 %
Lymphs Abs: 1.1 10*3/uL (ref 0.7–4.0)
MCH: 27.5 pg (ref 26.0–34.0)
MCHC: 32.7 g/dL (ref 30.0–36.0)
MCV: 84.1 fL (ref 80.0–100.0)
Monocytes Absolute: 0.6 10*3/uL (ref 0.1–1.0)
Monocytes Relative: 13 %
Neutro Abs: 2.8 10*3/uL (ref 1.7–7.7)
Neutrophils Relative %: 56 %
Platelet Count: 265 10*3/uL (ref 150–400)
RBC: 4.22 MIL/uL (ref 4.22–5.81)
RDW: 14.7 % (ref 11.5–15.5)
WBC Count: 4.9 10*3/uL (ref 4.0–10.5)
nRBC: 0 % (ref 0.0–0.2)

## 2021-12-16 LAB — CMP (CANCER CENTER ONLY)
ALT: 7 U/L (ref 0–44)
AST: 11 U/L — ABNORMAL LOW (ref 15–41)
Albumin: 3.7 g/dL (ref 3.5–5.0)
Alkaline Phosphatase: 73 U/L (ref 38–126)
Anion gap: 8 (ref 5–15)
BUN: 11 mg/dL (ref 6–20)
CO2: 23 mmol/L (ref 22–32)
Calcium: 8.5 mg/dL — ABNORMAL LOW (ref 8.9–10.3)
Chloride: 105 mmol/L (ref 98–111)
Creatinine: 0.78 mg/dL (ref 0.61–1.24)
GFR, Estimated: >60 mL/min (ref >60)
Glucose, Bld: 178 mg/dL — ABNORMAL HIGH (ref 70–99)
Potassium: 3.1 mmol/L — ABNORMAL LOW (ref 3.5–5.1)
Sodium: 136 mmol/L (ref 135–145)
Total Bilirubin: 0.4 mg/dL (ref 0.3–1.2)
Total Protein: 5.8 g/dL — ABNORMAL LOW (ref 6.5–8.1)

## 2021-12-16 LAB — LACTATE DEHYDROGENASE: LDH: 162 U/L (ref 98–192)

## 2021-12-16 MED ORDER — FAMOTIDINE IN NACL 20-0.9 MG/50ML-% IV SOLN
20.0000 mg | Freq: Once | INTRAVENOUS | Status: AC
  Administered 2021-12-16: 20 mg via INTRAVENOUS
  Filled 2021-12-16: qty 50

## 2021-12-16 MED ORDER — MEPERIDINE HCL 25 MG/ML IJ SOLN
25.0000 mg | Freq: Once | INTRAMUSCULAR | Status: AC
  Administered 2021-12-16: 25 mg via INTRAVENOUS
  Filled 2021-12-16: qty 1

## 2021-12-16 MED ORDER — SODIUM CHLORIDE 0.9 % IV SOLN
375.0000 mg/m2 | Freq: Once | INTRAVENOUS | Status: AC
  Administered 2021-12-16: 700 mg via INTRAVENOUS
  Filled 2021-12-16: qty 50

## 2021-12-16 MED ORDER — METHYLPREDNISOLONE SODIUM SUCC 125 MG IJ SOLR
80.0000 mg | Freq: Once | INTRAMUSCULAR | Status: AC
  Administered 2021-12-16: 80 mg via INTRAVENOUS
  Filled 2021-12-16: qty 2

## 2021-12-16 MED ORDER — ACETAMINOPHEN 325 MG PO TABS
650.0000 mg | ORAL_TABLET | Freq: Once | ORAL | Status: AC
  Administered 2021-12-16: 650 mg via ORAL
  Filled 2021-12-16: qty 2

## 2021-12-16 MED ORDER — DIPHENHYDRAMINE HCL 25 MG PO CAPS
50.0000 mg | ORAL_CAPSULE | Freq: Once | ORAL | Status: AC
  Administered 2021-12-16: 50 mg via ORAL
  Filled 2021-12-16: qty 2

## 2021-12-16 MED ORDER — SODIUM CHLORIDE 0.9 % IV SOLN: Freq: Once | INTRAVENOUS | Status: AC

## 2021-12-16 MED ORDER — OXYCODONE-ACETAMINOPHEN 5-325 MG PO TABS
1.0000 | ORAL_TABLET | Freq: Four times a day (QID) | ORAL | 0 refills | Status: DC | PRN
  Filled 2021-12-17 (×2): qty 60, 15d supply, fill #0
  Filled ????-??-?? (×2): fill #0

## 2021-12-16 NOTE — Patient Instructions (Signed)
[facility]  Discharge Instructions: ?Thank you for choosing [facility] to provide your oncology and hematology care.  ? ?If you have a lab appointment with the [facility], please go directly to the [facility] and check in at the registration area. ?  ?Wear comfortable clothing and clothing appropriate for easy access to any Portacath or PICC line.  ? ?We strive to give you quality time with your provider. You may need to reschedule your appointment if you arrive late (15 or more minutes).  Arriving late affects you and other patients whose appointments are after yours.  Also, if you miss three or more appointments without notifying the office, you may be dismissed from the clinic at the provider?s discretion.    ?  ?For prescription refill requests, have your pharmacy contact our office and allow 72 hours for refills to be completed.   ? ?Today you received the following chemotherapy and/or immunotherapy agents: Ruxience ?  ?To help prevent nausea and vomiting after your treatment, we encourage you to take your nausea medication as directed. ? ?BELOW ARE SYMPTOMS THAT SHOULD BE REPORTED IMMEDIATELY: ?*FEVER GREATER THAN 100.4 F (38 ?C) OR HIGHER ?*CHILLS OR SWEATING ?*NAUSEA AND VOMITING THAT IS NOT CONTROLLED WITH YOUR NAUSEA MEDICATION ?*UNUSUAL SHORTNESS OF BREATH ?*UNUSUAL BRUISING OR BLEEDING ?*URINARY PROBLEMS (pain or burning when urinating, or frequent urination) ?*BOWEL PROBLEMS (unusual diarrhea, constipation, pain near the anus) ?TENDERNESS IN MOUTH AND THROAT WITH OR WITHOUT PRESENCE OF ULCERS (sore throat, sores in mouth, or a toothache) ?UNUSUAL RASH, SWELLING OR PAIN  ?UNUSUAL VAGINAL DISCHARGE OR ITCHING  ? ?Items with * indicate a potential emergency and should be followed up as soon as possible or go to the Emergency Department if any problems should occur. ? ?Please show the CHEMOTHERAPY ALERT CARD or IMMUNOTHERAPY ALERT CARD at check-in to the  Emergency Department and triage nurse. ? ?Should you have questions after your visit or need to cancel or reschedule your appointment, please contact [facility]  Dept: [phone]  and follow the prompts.  Office hours are 8:00 a.m. to 4:30 p.m. Monday - Friday. Please note that voicemails left after 4:00 p.m. may not be returned until the following business day.  We are closed weekends and major holidays. You have access to a nurse at all times for urgent questions. Please call the main number to the clinic Dept: [phone] and follow the prompts. ? ? ?For any non-urgent questions, you may also contact your provider using MyChart. We now offer e-Visits for anyone 40 and older to request care online for non-urgent symptoms. For details visit [email or web address]. ?  ?Also download the MyChart app! Go to the app store, search "MyChart", open the app, select [facility], and log in with your MyChart username and password. ? ?Due to Covid, a mask is required upon entering the hospital/clinic. If you do not have a mask, one will be given to you upon arrival. For doctor visits, patients may have 1 support person aged 31 or older with them. For treatment visits, patients cannot have anyone with them due to current Covid guidelines and our immunocompromised population.  ? ?

## 2021-12-17 ENCOUNTER — Other Ambulatory Visit (HOSPITAL_COMMUNITY): Payer: Self-pay

## 2021-12-17 ENCOUNTER — Telehealth: Payer: Self-pay | Admitting: *Deleted

## 2021-12-17 ENCOUNTER — Inpatient Hospital Stay: Payer: Medicaid Other | Admitting: Physician Assistant

## 2021-12-17 ENCOUNTER — Encounter: Payer: Self-pay | Admitting: Hematology and Oncology

## 2021-12-17 ENCOUNTER — Other Ambulatory Visit: Payer: Self-pay | Admitting: *Deleted

## 2021-12-17 ENCOUNTER — Other Ambulatory Visit: Payer: Self-pay | Admitting: Hematology and Oncology

## 2021-12-17 ENCOUNTER — Inpatient Hospital Stay: Payer: Medicaid Other

## 2021-12-17 DIAGNOSIS — C880 Waldenstrom macroglobulinemia: Secondary | ICD-10-CM

## 2021-12-17 NOTE — Telephone Encounter (Signed)
[name] patient regarding his appt today. Spoke with him and he said he couldn't get a ride.  Advised to keep his fingers clean with mild soap and warm water and to put thick non perfumed cream or lotion on his fingers. He has an appt next week and he states he will be fine until then. Advised to call if his fingers get worse.  He said he would. ?

## 2021-12-17 NOTE — Telephone Encounter (Signed)
Received vm message from pt requesting antibiotics for "wounds on his fingers". ?[name] patient-no answer. [name] his significant other, Marlowe Kays. She states he is outside. Advised that he had called about his fingers and needing antibiotics. She states they start cracking and just open up-without any precipitating injury. Advised that we can see him today @ 12:30 for labs and then Dede Query, PA will see him @ 1pm. ?Advised that we cannot order antibiotics with out actually seeing the wounds on his fingers. Marlowe Kays voiced understanding and states that he will come for that appt.. ? ?Scheduling message sent ?

## 2021-12-21 ENCOUNTER — Other Ambulatory Visit: Payer: Self-pay | Admitting: Hematology and Oncology

## 2021-12-21 ENCOUNTER — Other Ambulatory Visit (HOSPITAL_COMMUNITY): Payer: Self-pay

## 2021-12-21 MED ORDER — ONDANSETRON HCL 8 MG PO TABS
8.0000 mg | ORAL_TABLET | Freq: Three times a day (TID) | ORAL | 0 refills | Status: DC | PRN
  Filled 2021-12-21 – 2022-02-08 (×2): qty 30, 10d supply, fill #0

## 2021-12-22 ENCOUNTER — Encounter: Payer: Self-pay | Admitting: Hematology and Oncology

## 2021-12-23 ENCOUNTER — Inpatient Hospital Stay (HOSPITAL_BASED_OUTPATIENT_CLINIC_OR_DEPARTMENT_OTHER): Payer: Medicaid Other | Admitting: Physician Assistant

## 2021-12-23 ENCOUNTER — Telehealth: Payer: Self-pay

## 2021-12-23 ENCOUNTER — Inpatient Hospital Stay: Payer: Medicaid Other | Admitting: Dietician

## 2021-12-23 ENCOUNTER — Inpatient Hospital Stay: Payer: Medicaid Other

## 2021-12-23 ENCOUNTER — Other Ambulatory Visit: Payer: Self-pay

## 2021-12-23 ENCOUNTER — Other Ambulatory Visit (HOSPITAL_COMMUNITY): Payer: Self-pay

## 2021-12-23 VITALS — BP 96/59 | HR 64 | Temp 97.8°F | Resp 17 | Wt 146.6 lb

## 2021-12-23 DIAGNOSIS — R112 Nausea with vomiting, unspecified: Secondary | ICD-10-CM | POA: Diagnosis not present

## 2021-12-23 DIAGNOSIS — Z79899 Other long term (current) drug therapy: Secondary | ICD-10-CM | POA: Diagnosis not present

## 2021-12-23 DIAGNOSIS — R5383 Other fatigue: Secondary | ICD-10-CM | POA: Diagnosis not present

## 2021-12-23 DIAGNOSIS — Z5112 Encounter for antineoplastic immunotherapy: Secondary | ICD-10-CM | POA: Diagnosis not present

## 2021-12-23 DIAGNOSIS — C880 Waldenstrom macroglobulinemia: Secondary | ICD-10-CM | POA: Diagnosis not present

## 2021-12-23 DIAGNOSIS — Z87891 Personal history of nicotine dependence: Secondary | ICD-10-CM | POA: Diagnosis not present

## 2021-12-23 DIAGNOSIS — L089 Local infection of the skin and subcutaneous tissue, unspecified: Secondary | ICD-10-CM

## 2021-12-23 DIAGNOSIS — D472 Monoclonal gammopathy: Secondary | ICD-10-CM | POA: Diagnosis not present

## 2021-12-23 DIAGNOSIS — I10 Essential (primary) hypertension: Secondary | ICD-10-CM | POA: Diagnosis not present

## 2021-12-23 DIAGNOSIS — R630 Anorexia: Secondary | ICD-10-CM | POA: Diagnosis not present

## 2021-12-23 DIAGNOSIS — Z8249 Family history of ischemic heart disease and other diseases of the circulatory system: Secondary | ICD-10-CM | POA: Diagnosis not present

## 2021-12-23 LAB — CBC WITH DIFFERENTIAL (CANCER CENTER ONLY)
Abs Immature Granulocytes: 0.08 10*3/uL — ABNORMAL HIGH (ref 0.00–0.07)
Basophils Absolute: 0 10*3/uL (ref 0.0–0.1)
Basophils Relative: 0 %
Eosinophils Absolute: 0.1 10*3/uL (ref 0.0–0.5)
Eosinophils Relative: 1 %
HCT: 37.4 % — ABNORMAL LOW (ref 39.0–52.0)
Hemoglobin: 12.3 g/dL — ABNORMAL LOW (ref 13.0–17.0)
Immature Granulocytes: 1 %
Lymphocytes Relative: 13 %
Lymphs Abs: 1 10*3/uL (ref 0.7–4.0)
MCH: 26.9 pg (ref 26.0–34.0)
MCHC: 32.9 g/dL (ref 30.0–36.0)
MCV: 81.8 fL (ref 80.0–100.0)
Monocytes Absolute: 1.7 10*3/uL — ABNORMAL HIGH (ref 0.1–1.0)
Monocytes Relative: 21 %
Neutro Abs: 5 10*3/uL (ref 1.7–7.7)
Neutrophils Relative %: 64 %
Platelet Count: 295 10*3/uL (ref 150–400)
RBC: 4.57 MIL/uL (ref 4.22–5.81)
RDW: 14.6 % (ref 11.5–15.5)
WBC Count: 7.8 10*3/uL (ref 4.0–10.5)
nRBC: 0 % (ref 0.0–0.2)

## 2021-12-23 LAB — CMP (CANCER CENTER ONLY)
ALT: 5 U/L (ref 0–44)
AST: 11 U/L — ABNORMAL LOW (ref 15–41)
Albumin: 3.8 g/dL (ref 3.5–5.0)
Alkaline Phosphatase: 75 U/L (ref 38–126)
Anion gap: 8 (ref 5–15)
BUN: 29 mg/dL — ABNORMAL HIGH (ref 6–20)
CO2: 23 mmol/L (ref 22–32)
Calcium: 8.3 mg/dL — ABNORMAL LOW (ref 8.9–10.3)
Chloride: 102 mmol/L (ref 98–111)
Creatinine: 1.73 mg/dL — ABNORMAL HIGH (ref 0.61–1.24)
GFR, Estimated: 45 mL/min — ABNORMAL LOW
Glucose, Bld: 133 mg/dL — ABNORMAL HIGH (ref 70–99)
Potassium: 3.8 mmol/L (ref 3.5–5.1)
Sodium: 133 mmol/L — ABNORMAL LOW (ref 135–145)
Total Bilirubin: 0.6 mg/dL (ref 0.3–1.2)
Total Protein: 6.4 g/dL — ABNORMAL LOW (ref 6.5–8.1)

## 2021-12-23 LAB — LACTATE DEHYDROGENASE: LDH: 147 U/L (ref 98–192)

## 2021-12-23 MED ORDER — SODIUM CHLORIDE 0.9 % IV SOLN: INTRAVENOUS | Status: DC

## 2021-12-23 MED ORDER — DOXYCYCLINE HYCLATE 100 MG PO TABS: 100.0000 mg | ORAL_TABLET | Freq: Two times a day (BID) | ORAL | 0 refills | Status: AC

## 2021-12-23 MED ORDER — ONDANSETRON HCL 4 MG/2ML IJ SOLN
8.0000 mg | Freq: Once | INTRAMUSCULAR | Status: AC
  Administered 2021-12-23: 8 mg via INTRAVENOUS
  Filled 2021-12-23: qty 4

## 2021-12-23 MED ORDER — SODIUM CHLORIDE 0.9 % IV SOLN: 8.0000 mg | Freq: Once | INTRAVENOUS | Status: DC

## 2021-12-23 NOTE — Patient Instructions (Signed)

## 2021-12-23 NOTE — Progress Notes (Signed)
?Connor Solomon ?Telephone:([phone]   [phone] ? ?PROGRESS NOTE ? ?Patient Care Team: ?Delene Ruffini, MD as PCP - General (Internal Medicine) ? ?Hematological/Oncological History ?# Waldenstrom's Macroglobulinemia ?# IgM Monoclonal Gammopathy ?04/07/2021: CT A/p showed mildly enlarged retroperitoneal lymph nodes and bilateral inguinal lymph nodes.  ?04/08/2021: IgM Kappa M protein 1.2 ?04/09/2021: WBC 11.1, Hgb 9.5, MCV 75.4, Plt 525 ?04/20/2021: Kappa 977, Lambda 7.8, K/L ratio 125.28. Serum viscosity 1.8 ?05/06/2021: Establish care with Dr. 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 Infusion reaction with ritux, held halfway through.  ?06/12/2021: Cycle 2 Day 1 of Ibrutinib + Rituximab ?06/19/2021: Cycle 3 Day 1 of Ibrutinib + Rituximab ?06/26/2021: Cycle 4 Day 1 of rituximab. Continued on daily ibrutinib ?12/02/2021: Cycle 5 Day 1 of Ibrutinib + Rituximab ?12/09/2021: Cycle 6 Day 1 of Ibrutinib + Rituximab ?12/16/2021: Cycle 7 Day 1 of Ibrutinib + Rituximab ?12/23/2021: Held Cycle 8 of Ritxumab due to nausea/vomiting, elevated creatinine, finger infection ? ?  ?Interval History:  ?Connor Solomon 61 y.o. male with medical history significant for Waldenstrom's macroglobulinemia who presents for a follow up visit. The patient's last visit was on 11/24/2021. In the interim since the last visit he has continued on ibrutinib therapy and resumed weekly rituximab therapy, due for his final dose today. ? ?On exam today Mr. Rami reports he has not been feeling well over the last several days.  He had several episodes of nausea and vomiting in the last 24 hours.  He takes Zofran every 8 hours with minimal relief.  He continues to lose weight, and extra 2 pounds since the last visit.  He tries to supplement his diet  with Ensure chain shakes.  He reports epigastric discomfort especially after having several episodes of vomiting.  Patient reports pain and redness at the tip of his left middle finger.  He reports that there was a blister that was at the tip of the left middle finger which he popped using a safety pin and poured peroxide over the finger. He denies any bowel habit changes including diarrhea or constipation.  He otherwise is not having any other concerning side effects or symptoms at this time.  He denies fevers, chills, night sweats, chest pain, headache or vision changes. Full 10 point ROS is listed below. ? ?MEDICAL HISTORY:  ?Past Medical History:  ?Diagnosis Date  ? Acute heart failure ([place]) 04/08/2021  ? Acute HFrEF (heart failure with reduced ejection fraction) ([place]) 04/07/2021  ? Arthritis   ? hands, elbows bilaterally  ? Cancer [facility])   ? Full dentures   ? Hypertension   ? Pt states he doen not have HTN and has never been treated for HTN  ? Kidney stone on left side last stone 4-5 yrs ago  ? recurrent (3 episodes)  ? Panic disorder   ? pt states has resolved  ? Syncope   ? resolved- was related to panic attacks  ? ? ?SURGICAL HISTORY: ?Past Surgical History:  ?Procedure Laterality Date  ? CARPAL METACARPAL FUSION WITH DISTAL RADIAL BONE GRAFT Left 05/30/2013  ? Procedure: LEFT THUMB METACARPAL JOINT FUSION;  Surgeon: Schuyler Amor, MD;  Location: [facility];  Service: Orthopedics;  Laterality: Left;  ? ESOPHAGOGASTRODUODENOSCOPY N/A 02/12/2014  ? Procedure: ESOPHAGOGASTRODUODENOSCOPY (EGD);  Surgeon: Jerene Bears, MD;  Location: [facility] ENDOSCOPY;  Service: Endoscopy;  Laterality:  N/A;  ? FOREIGN BODY REMOVAL N/A 02/12/2014  ? Procedure: FOREIGN BODY REMOVAL;  Surgeon: Jerene Bears, MD;  Location: [facility] ENDOSCOPY;  Service: Endoscopy;  Laterality: N/A;  ? OPEN REDUCTION INTERNAL FIXATION (ORIF) DISTAL RADIAL FRACTURE Left 11/29/2012  ? Procedure: OPEN REDUCTION INTERNAL FIXATION (ORIF) DISTAL RADIAL  FRACTURE;  Surgeon: Schuyler Amor, MD;  Location: [facility];  Service: Orthopedics;  Laterality: Left;  LEFT DISTAL RADIUS OSTEOTOMY WITH BONE GRAFT  ? RIGHT/LEFT HEART CATH AND CORONARY ANGIOGRAPHY N/A 04/09/2021  ? Procedure: RIGHT/LEFT HEART CATH AND CORONARY ANGIOGRAPHY;  Surgeon: Jolaine Artist, MD;  Location: [facility] LAB;  Service: Cardiovascular;  Laterality: N/A;  ? TENDON REPAIR Left 02/07/2013  ? Procedure: LEFT EXTENSOR INDICIS PROPRIUS  TO EXTENSOR POLLICIS LONGUS TENDON TRANSFER;  Surgeon: Schuyler Amor, MD;  Location: [facility];  Service: Orthopedics;  Laterality: Left;  ? WISDOM TOOTH EXTRACTION    ? WRIST SURGERY    ? fx only  ? ? ?SOCIAL HISTORY: ?Social History  ? ?Socioeconomic History  ? Marital status: Single  ?  Spouse name: Not on file  ? Number of children: Not on file  ? Years of education: Not on file  ? Highest education level: Not on file  ?Occupational History  ? Not on file  ?Tobacco Use  ? Smoking status: Former  ?  Types: Cigars  ?  Quit date: 10/11/2020  ?  Years since quitting: 1.2  ? Smokeless tobacco: Never  ? Tobacco comments:  ?  smokes 1 cigar a week  ?Substance and Sexual Activity  ? Alcohol use: No  ? Drug use: No  ? Sexual activity: Not on file  ?Other Topics Concern  ? Not on file  ?Social History Narrative  ? Not on file  ? ?Social Determinants of Health  ? ?Financial Resource Strain: High Risk  ? Difficulty of Paying Living Expenses: Very hard  ?Food Insecurity: Food Insecurity Present  ? Worried About Charity fundraiser in the Last Year: Sometimes true  ? Ran Out of Food in the Last Year: Sometimes true  ?Transportation Needs: No Transportation Needs  ? Lack of Transportation (Medical): No  ? Lack of Transportation (Non-Medical): No  ?Physical Activity: Not on file  ?Stress: Stress Concern Present  ? Feeling of Stress : Very much  ?Social Connections: Not on file  ?Intimate Partner Violence: Not on file  ? ? ?FAMILY  HISTORY: ?Family History  ?Problem Relation Age of Onset  ? Heart attack Father   ? Sudden Cardiac Death Father 23  ? Diabetes Neg Hx   ? Hyperlipidemia Neg Hx   ? Hypertension Neg Hx   ? ? ?ALLERGIES:  is allergic to rituxan [rituximab] and hydrocodone. ? ?MEDICATIONS:  ?Current Outpatient Medications  ?Medication Sig Dispense Refill  ? acetaminophen (TYLENOL) 500 MG tablet Take 500 mg by mouth every 6 (six) hours as needed.    ? albuterol (PROAIR HFA) 108 (90 Base) MCG/ACT inhaler Inhale 2 puffs into the lungs every 6 (six) hours as needed for wheezing or shortness of breath. 8.5 g 2  ? allopurinol (ZYLOPRIM) 300 MG tablet TAKE 1 TABLET BY MOUTH EVERY DAY 30 tablet 0  ? carvedilol (COREG) 3.125 MG tablet Take 1 tablet by mouth 2 times daily. 68 tablet 2  ? doxycycline (VIBRA-TABS) 100 MG tablet Take 1 tablet (100 mg total) by mouth 2 (two) times daily for 10 days. 20 tablet 0  ? empagliflozin (JARDIANCE)  10 MG TABS tablet Take 1 tablet by mouth daily before breakfast. 30 tablet 11  ? eplerenone (INSPRA) 25 MG tablet Take 1/2 tablet (12.5 mg total) by mouth daily. 15 tablet 11  ? furosemide (LASIX) 20 MG tablet Take 1 tablet (20 mg total) by mouth daily as needed for swelling 100 tablet 2  ? ibrutinib 420 MG TABS Take 420 mg by mouth daily. 30 tablet 2  ? metoCLOPramide (REGLAN) 10 MG tablet Take 1 tablet (10 mg total) by mouth every 8 (eight) hours as needed for nausea. 30 tablet 1  ? ondansetron (ZOFRAN) 8 MG tablet Take 1 tablet by mouth every 8 hours as needed. 30 tablet 0  ? oxyCODONE-acetaminophen (PERCOCET) 5-325 MG tablet Take 1 tablet by mouth every 6 hours as needed for severe pain. 60 tablet 0  ? sacubitril-valsartan (ENTRESTO) 24-26 MG Take 1 tablet by mouth 2 (two) times daily. 60 tablet 5  ? ?No current facility-administered medications for this visit.  ? ? ?REVIEW OF SYSTEMS:   ?Constitutional: ( - ) fevers, ( - )  chills , ( - ) night sweats ?Eyes: ( - ) blurriness of vision, ( - ) double vision, (  - ) watery eyes ?Ears, nose, mouth, throat, and face: ( - ) mucositis, ( - ) sore throat ?Respiratory: ( - ) cough, ( - ) dyspnea, ( - ) wheezes ?Cardiovascular: ( - ) palpitation, ( - ) chest discomfort, ( - )

## 2021-12-23 NOTE — Telephone Encounter (Signed)
Culture obtained from left thumb and taken to lab. ?

## 2021-12-23 NOTE — Progress Notes (Signed)
Nutrition Follow-up: ? ?Patient with Waldenstrom macroglobulinemia. He is receiving ibrutinib + rituximab q7d.   ? ?Met with patient during infusion. Chemotherapy held today. He is receiving IV fluids. Patient reports ongoing episodes of nausea and vomiting. This occurs ~once every 2 weeks, lasting for about a day and a half. Patient reports he is unable to stop vomiting once he starts. He has been taking zofran as needed. This works well for him. Patient will begin taking it every 8 hours per MD. Patient is drinking Ensure Plus supplements twice daily.  ? ?Medications: Zofran ? ?Labs: Na 133, Glucose 133, BUN 29, Cr 1.73 ? ?Anthropometrics: weight 146 lb 9.6 oz today decreased from 149 lb 12 oz on 3/8  ? ?3/1 - 145 lb 12 oz ?2/22 - 151 lb 4 oz  ? ? ?NUTRITION DIAGNOSIS: Inadequate oral intake ? ? ?INTERVENTION:  ?Reviewed tips for nausea and continued encouragement for eating smaller more frequent meals/snacks  ?Continue drinking 2-3 Ensure Plus/equivalent daily - will provided additional case next week if needed (pt aware)  ? ?  ?MONITORING, EVALUATION, GOAL: weight trends, intake ? ? ?NEXT VISIT: To be scheduled with treatment  ? ? ? ?

## 2021-12-24 ENCOUNTER — Encounter: Payer: Self-pay | Admitting: Hematology and Oncology

## 2021-12-24 DIAGNOSIS — L089 Local infection of the skin and subcutaneous tissue, unspecified: Secondary | ICD-10-CM | POA: Insufficient documentation

## 2021-12-25 LAB — AEROBIC CULTURE W GRAM STAIN (SUPERFICIAL SPECIMEN): Gram Stain: NONE SEEN

## 2021-12-29 ENCOUNTER — Other Ambulatory Visit: Payer: Self-pay | Admitting: Hematology and Oncology

## 2021-12-29 DIAGNOSIS — N62 Hypertrophy of breast: Secondary | ICD-10-CM

## 2021-12-30 ENCOUNTER — Other Ambulatory Visit (HOSPITAL_COMMUNITY): Payer: Self-pay

## 2021-12-30 ENCOUNTER — Encounter: Payer: Self-pay | Admitting: Hematology and Oncology

## 2021-12-30 ENCOUNTER — Inpatient Hospital Stay: Payer: Medicaid Other

## 2021-12-30 ENCOUNTER — Other Ambulatory Visit: Payer: Self-pay | Admitting: Physician Assistant

## 2021-12-30 ENCOUNTER — Other Ambulatory Visit: Payer: Self-pay

## 2021-12-30 VITALS — BP 134/79 | HR 50 | Temp 97.8°F | Resp 18 | Wt 147.5 lb

## 2021-12-30 DIAGNOSIS — R630 Anorexia: Secondary | ICD-10-CM | POA: Diagnosis not present

## 2021-12-30 DIAGNOSIS — D472 Monoclonal gammopathy: Secondary | ICD-10-CM | POA: Diagnosis not present

## 2021-12-30 DIAGNOSIS — R5383 Other fatigue: Secondary | ICD-10-CM | POA: Diagnosis not present

## 2021-12-30 DIAGNOSIS — E876 Hypokalemia: Secondary | ICD-10-CM

## 2021-12-30 DIAGNOSIS — C880 Waldenstrom macroglobulinemia: Secondary | ICD-10-CM

## 2021-12-30 DIAGNOSIS — R112 Nausea with vomiting, unspecified: Secondary | ICD-10-CM | POA: Diagnosis not present

## 2021-12-30 DIAGNOSIS — I10 Essential (primary) hypertension: Secondary | ICD-10-CM | POA: Diagnosis not present

## 2021-12-30 DIAGNOSIS — Z87891 Personal history of nicotine dependence: Secondary | ICD-10-CM | POA: Diagnosis not present

## 2021-12-30 DIAGNOSIS — Z8249 Family history of ischemic heart disease and other diseases of the circulatory system: Secondary | ICD-10-CM | POA: Diagnosis not present

## 2021-12-30 DIAGNOSIS — Z5112 Encounter for antineoplastic immunotherapy: Secondary | ICD-10-CM | POA: Diagnosis not present

## 2021-12-30 DIAGNOSIS — Z79899 Other long term (current) drug therapy: Secondary | ICD-10-CM | POA: Diagnosis not present

## 2021-12-30 LAB — CBC WITH DIFFERENTIAL (CANCER CENTER ONLY)
Abs Immature Granulocytes: 0 10*3/uL (ref 0.00–0.07)
Basophils Absolute: 0 10*3/uL (ref 0.0–0.1)
Basophils Relative: 1 %
Eosinophils Absolute: 0.2 10*3/uL (ref 0.0–0.5)
Eosinophils Relative: 7 %
HCT: 33.8 % — ABNORMAL LOW (ref 39.0–52.0)
Hemoglobin: 11.2 g/dL — ABNORMAL LOW (ref 13.0–17.0)
Immature Granulocytes: 0 %
Lymphocytes Relative: 45 %
Lymphs Abs: 1.3 10*3/uL (ref 0.7–4.0)
MCH: 26.9 pg (ref 26.0–34.0)
MCHC: 33.1 g/dL (ref 30.0–36.0)
MCV: 81.3 fL (ref 80.0–100.0)
Monocytes Absolute: 0.5 10*3/uL (ref 0.1–1.0)
Monocytes Relative: 16 %
Neutro Abs: 0.9 10*3/uL — ABNORMAL LOW (ref 1.7–7.7)
Neutrophils Relative %: 31 %
Platelet Count: 289 10*3/uL (ref 150–400)
RBC: 4.16 MIL/uL — ABNORMAL LOW (ref 4.22–5.81)
RDW: 14.2 % (ref 11.5–15.5)
WBC Count: 2.9 10*3/uL — ABNORMAL LOW (ref 4.0–10.5)
nRBC: 0 % (ref 0.0–0.2)

## 2021-12-30 LAB — CMP (CANCER CENTER ONLY)
ALT: 5 U/L (ref 0–44)
AST: 10 U/L — ABNORMAL LOW (ref 15–41)
Albumin: 3.7 g/dL (ref 3.5–5.0)
Alkaline Phosphatase: 68 U/L (ref 38–126)
Anion gap: 6 (ref 5–15)
BUN: 12 mg/dL (ref 6–20)
CO2: 24 mmol/L (ref 22–32)
Calcium: 8.5 mg/dL — ABNORMAL LOW (ref 8.9–10.3)
Chloride: 108 mmol/L (ref 98–111)
Creatinine: 0.79 mg/dL (ref 0.61–1.24)
GFR, Estimated: >60 mL/min (ref >60)
Glucose, Bld: 99 mg/dL (ref 70–99)
Potassium: 3.2 mmol/L — ABNORMAL LOW (ref 3.5–5.1)
Sodium: 138 mmol/L (ref 135–145)
Total Bilirubin: 0.4 mg/dL (ref 0.3–1.2)
Total Protein: 5.5 g/dL — ABNORMAL LOW (ref 6.5–8.1)

## 2021-12-30 LAB — LACTATE DEHYDROGENASE: LDH: 95 U/L — ABNORMAL LOW (ref 98–192)

## 2021-12-30 MED ORDER — DIPHENHYDRAMINE HCL 25 MG PO CAPS
50.0000 mg | ORAL_CAPSULE | Freq: Once | ORAL | Status: AC
  Administered 2021-12-30: 50 mg via ORAL
  Filled 2021-12-30: qty 2

## 2021-12-30 MED ORDER — OXYCODONE-ACETAMINOPHEN 5-325 MG PO TABS
1.0000 | ORAL_TABLET | Freq: Four times a day (QID) | ORAL | 0 refills | Status: DC | PRN
  Filled 2021-12-30: qty 60, 15d supply, fill #0

## 2021-12-30 MED ORDER — POTASSIUM CHLORIDE CRYS ER 20 MEQ PO TBCR
40.0000 meq | EXTENDED_RELEASE_TABLET | Freq: Once | ORAL | Status: AC
  Administered 2021-12-30: 40 meq via ORAL
  Filled 2021-12-30: qty 2

## 2021-12-30 MED ORDER — SODIUM CHLORIDE 0.9 % IV SOLN
375.0000 mg/m2 | Freq: Once | INTRAVENOUS | Status: AC
  Administered 2021-12-30: 700 mg via INTRAVENOUS
  Filled 2021-12-30: qty 50

## 2021-12-30 MED ORDER — MEPERIDINE HCL 25 MG/ML IJ SOLN
25.0000 mg | Freq: Once | INTRAMUSCULAR | Status: AC
  Administered 2021-12-30: 25 mg via INTRAVENOUS
  Filled 2021-12-30: qty 1

## 2021-12-30 MED ORDER — ACETAMINOPHEN 325 MG PO TABS
650.0000 mg | ORAL_TABLET | Freq: Once | ORAL | Status: AC
  Administered 2021-12-30: 650 mg via ORAL
  Filled 2021-12-30: qty 2

## 2021-12-30 MED ORDER — FAMOTIDINE IN NACL 20-0.9 MG/50ML-% IV SOLN
20.0000 mg | Freq: Once | INTRAVENOUS | Status: AC
  Administered 2021-12-30: 20 mg via INTRAVENOUS
  Filled 2021-12-30: qty 50

## 2021-12-30 MED ORDER — METHYLPREDNISOLONE SODIUM SUCC 125 MG IJ SOLR
80.0000 mg | Freq: Once | INTRAMUSCULAR | Status: AC
  Administered 2021-12-30: 80 mg via INTRAVENOUS
  Filled 2021-12-30: qty 2

## 2021-12-30 MED ORDER — SODIUM CHLORIDE 0.9 % IV SOLN: Freq: Once | INTRAVENOUS | Status: AC

## 2021-12-30 NOTE — Progress Notes (Signed)
Ok to proceed with ANC of 0.9 per Dede Query, PA.  ?

## 2021-12-30 NOTE — Patient Instructions (Signed)
[facility]  Discharge Instructions: ?Thank you for choosing [facility] to provide your oncology and hematology care.  ? ?If you have a lab appointment with the [facility], please go directly to the [facility] and check in at the registration area. ?  ?Wear comfortable clothing and clothing appropriate for easy access to any Portacath or PICC line.  ? ?We strive to give you quality time with your provider. You may need to reschedule your appointment if you arrive late (15 or more minutes).  Arriving late affects you and other patients whose appointments are after yours.  Also, if you miss three or more appointments without notifying the office, you may be dismissed from the clinic at the provider?s discretion.    ?  ?For prescription refill requests, have your pharmacy contact our office and allow 72 hours for refills to be completed.   ? ?Today you received the following chemotherapy and/or immunotherapy agents: Ruxience ?  ?To help prevent nausea and vomiting after your treatment, we encourage you to take your nausea medication as directed. ? ?BELOW ARE SYMPTOMS THAT SHOULD BE REPORTED IMMEDIATELY: ?*FEVER GREATER THAN 100.4 F (38 ?C) OR HIGHER ?*CHILLS OR SWEATING ?*NAUSEA AND VOMITING THAT IS NOT CONTROLLED WITH YOUR NAUSEA MEDICATION ?*UNUSUAL SHORTNESS OF BREATH ?*UNUSUAL BRUISING OR BLEEDING ?*URINARY PROBLEMS (pain or burning when urinating, or frequent urination) ?*BOWEL PROBLEMS (unusual diarrhea, constipation, pain near the anus) ?TENDERNESS IN MOUTH AND THROAT WITH OR WITHOUT PRESENCE OF ULCERS (sore throat, sores in mouth, or a toothache) ?UNUSUAL RASH, SWELLING OR PAIN  ?UNUSUAL VAGINAL DISCHARGE OR ITCHING  ? ?Items with * indicate a potential emergency and should be followed up as soon as possible or go to the Emergency Department if any problems should occur. ? ?Please show the CHEMOTHERAPY ALERT CARD or IMMUNOTHERAPY ALERT CARD at check-in to the  Emergency Department and triage nurse. ? ?Should you have questions after your visit or need to cancel or reschedule your appointment, please contact [facility]  Dept: [phone]  and follow the prompts.  Office hours are 8:00 a.m. to 4:30 p.m. Monday - Friday. Please note that voicemails left after 4:00 p.m. may not be returned until the following business day.  We are closed weekends and major holidays. You have access to a nurse at all times for urgent questions. Please call the main number to the clinic Dept: [phone] and follow the prompts. ? ? ?For any non-urgent questions, you may also contact your provider using MyChart. We now offer e-Visits for anyone 40 and older to request care online for non-urgent symptoms. For details visit [email or web address]. ?  ?Also download the MyChart app! Go to the app store, search "MyChart", open the app, select [facility], and log in with your MyChart username and password. ? ?Due to Covid, a mask is required upon entering the hospital/clinic. If you do not have a mask, one will be given to you upon arrival. For doctor visits, patients may have 1 support person aged 74 or older with them. For treatment visits, patients cannot have anyone with them due to current Covid guidelines and our immunocompromised population.  ? ?

## 2021-12-31 ENCOUNTER — Other Ambulatory Visit: Payer: Self-pay | Admitting: Hematology and Oncology

## 2022-01-04 ENCOUNTER — Other Ambulatory Visit (HOSPITAL_COMMUNITY): Payer: Self-pay

## 2022-01-04 MED ORDER — ALBUTEROL SULFATE HFA 108 (90 BASE) MCG/ACT IN AERS
2.0000 | INHALATION_SPRAY | Freq: Four times a day (QID) | RESPIRATORY_TRACT | 1 refills | Status: DC | PRN
  Filled 2022-01-04: qty 18, 25d supply, fill #0

## 2022-01-05 ENCOUNTER — Other Ambulatory Visit (HOSPITAL_COMMUNITY): Payer: Self-pay

## 2022-01-05 ENCOUNTER — Ambulatory Visit (HOSPITAL_COMMUNITY)
Admission: RE | Admit: 2022-01-05 | Discharge: 2022-01-05 | Disposition: A | Discharge status: Home / Self Care | Payer: Medicaid Other | Source: Ambulatory Visit | Attending: Family Medicine | Admitting: Family Medicine

## 2022-01-05 ENCOUNTER — Other Ambulatory Visit: Payer: Self-pay

## 2022-01-05 ENCOUNTER — Encounter (HOSPITAL_COMMUNITY): Payer: Self-pay | Admitting: Cardiology

## 2022-01-05 ENCOUNTER — Encounter (HOSPITAL_COMMUNITY): Payer: Self-pay

## 2022-01-05 VITALS — BP 100/60 | HR 62 | Wt 144.6 lb

## 2022-01-05 DIAGNOSIS — Z7984 Long term (current) use of oral hypoglycemic drugs: Secondary | ICD-10-CM | POA: Diagnosis not present

## 2022-01-05 DIAGNOSIS — Z87891 Personal history of nicotine dependence: Secondary | ICD-10-CM | POA: Diagnosis not present

## 2022-01-05 DIAGNOSIS — M25442 Effusion, left hand: Secondary | ICD-10-CM | POA: Insufficient documentation

## 2022-01-05 DIAGNOSIS — J90 Pleural effusion, not elsewhere classified: Secondary | ICD-10-CM | POA: Insufficient documentation

## 2022-01-05 DIAGNOSIS — I5022 Chronic systolic (congestive) heart failure: Secondary | ICD-10-CM | POA: Diagnosis not present

## 2022-01-05 DIAGNOSIS — I340 Nonrheumatic mitral (valve) insufficiency: Secondary | ICD-10-CM | POA: Diagnosis not present

## 2022-01-05 DIAGNOSIS — I428 Other cardiomyopathies: Secondary | ICD-10-CM | POA: Diagnosis not present

## 2022-01-05 DIAGNOSIS — Z9221 Personal history of antineoplastic chemotherapy: Secondary | ICD-10-CM | POA: Insufficient documentation

## 2022-01-05 DIAGNOSIS — M25441 Effusion, right hand: Secondary | ICD-10-CM | POA: Insufficient documentation

## 2022-01-05 DIAGNOSIS — R001 Bradycardia, unspecified: Secondary | ICD-10-CM | POA: Insufficient documentation

## 2022-01-05 DIAGNOSIS — Z8616 Personal history of COVID-19: Secondary | ICD-10-CM | POA: Diagnosis not present

## 2022-01-05 DIAGNOSIS — Z8249 Family history of ischemic heart disease and other diseases of the circulatory system: Secondary | ICD-10-CM | POA: Diagnosis not present

## 2022-01-05 DIAGNOSIS — I110 Hypertensive heart disease with heart failure: Secondary | ICD-10-CM | POA: Diagnosis not present

## 2022-01-05 DIAGNOSIS — Z79899 Other long term (current) drug therapy: Secondary | ICD-10-CM | POA: Insufficient documentation

## 2022-01-05 DIAGNOSIS — C880 Waldenstrom macroglobulinemia: Secondary | ICD-10-CM | POA: Diagnosis not present

## 2022-01-05 DIAGNOSIS — D509 Iron deficiency anemia, unspecified: Secondary | ICD-10-CM | POA: Insufficient documentation

## 2022-01-05 DIAGNOSIS — R9431 Abnormal electrocardiogram [ECG] [EKG]: Secondary | ICD-10-CM | POA: Diagnosis not present

## 2022-01-05 NOTE — Patient Instructions (Signed)
It was great to see you today! ?No medication changes are needed at this time. ? ? ?Your physician wants you to follow-up in 4 months with Dr Haroldine Laws. You will receive a reminder letter in the mail two months in advance. If you don't receive a letter, please call our office to schedule the follow-up appointment. ? ? ?Do the following things EVERYDAY: ?Weigh yourself in the morning before breakfast. Write it down and keep it in a log. ?Take your medicines as prescribed ?Eat low salt foods--Limit salt (sodium) to 2000 mg per day.  ?Stay as active as you can everyday ?Limit all fluids for the day to less than 2 liters ? ?At the [facility], you and your health needs are our priority. As part of our continuing mission to provide you with exceptional heart care, we have created designated Provider Care Teams. These Care Teams include your primary Cardiologist (physician) and Advanced Practice Providers (APPs- Physician Assistants and Nurse Practitioners) who all work together to provide you with the care you need, when you need it.  ? ?You may see any of the following providers on your designated Care Team at your next follow up: ?Dr Glori Bickers ?Dr Loralie Champagne ?Darrick Grinder, NP ?Lyda Jester, PA ?Jessica Milford,NP ?Marlyce Huge, PA ?Audry Riles, PharmD ? ? ?Please be sure to bring in all your medications bottles to every appointment.  ? ?If you have any questions or concerns before your next appointment please send [place] a message through [facility] or call our office at [phone].   ? ?TO LEAVE A MESSAGE FOR THE NURSE SELECT OPTION 2, PLEASE LEAVE A MESSAGE INCLUDING: ?YOUR NAME ?DATE OF BIRTH ?CALL BACK NUMBER ?REASON FOR CALL**this is important as we prioritize the call backs ? ?YOU WILL RECEIVE A CALL BACK THE SAME DAY AS LONG AS YOU CALL BEFORE 4:00 PM ? ? ? ?

## 2022-01-05 NOTE — Progress Notes (Signed)
?Advanced Heart Failure Team Clinic Note ?PCP: Juluis Mire ?HF Cardiologist: Dr. Haroldine Laws ? ?HPI: ?Connor Solomon is a 61 y.o. WM w/ h/o HTN, new IDA and new systolic HF.  ? ?Pt had stress echo in 2010 for CP showing no stress arrhythmias or conduction abnormalities. The stress ECG was negative for ischemia. There was no echocardiographic evidence for stress-induced ischemia. EF normal. In 2011, he had syncope and evaluated by Dr. Harrington Challenger but this was felt to be vasovagal syncope. No further cardiac w/u.  ? ?Admitted 04/07/21 where he was found to be in acute CHF and 20 pound weight loss. CXR suspicious for bronchopneumonia. Also could not r/o underlying malignancy. CT + for moderate bilateral pleural effusions and patchy airspace opacities consistent with multifocal pneumonia. He was found to be anemic w/ hgb 8.6.  ? ?Echo showed severely reduced LVEF, 20-25% w/ glogal HK, Grade III DD (restrictive), RV normal. Mod-severe MR. No LVH. 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 ESR 142, autoimmune workup ensued. cMRI consistent with NICM, EF 25%.  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 Discharged on spiro, carvedilol and losartan (bp too soft for Entresto). ? ?Seen by Dr. Lorenso Courier with Heme/Onc. 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 Started Ibrutinib + Rituximab 06/05/21.  ? ?Echo (09/14/21): EF 25% RV ok  ? ?Today he returns for HF follow up. Overall feeling fair. Struggling with nausea and weight loss from chemo. Fatigued during the day but breathing is OK with ADLs. Has joint swelling in hands. Denies abnormal bleeding, palpitations, CP, dizziness, edema, or PND/Orthopnea. Appetite poor. No fever or chills. Weight at home 145 pounds. Taking all medications. Takes lasix every other week. Stopped smoking. ? ?Cardiac Studies:  ? ?- R/LHC (6/22): Normal  cors, severe MICM EF 25%, low filling pressures with normal CO. ?  ?Ao =98/68 (82) ?LV = 81/3 ?RA = 1 ?RV = 27/2 ?PA = 25/8 (16) ?PCW = 10 ?Fick cardiac output/index = 5.8/3.2 ?PVR = 1.0 WU ?Ao sat = 100% ?PA sat = 66%, 70% ?  ?- Echo (6/22): EF 20-25%, Grade III DD, RV ok, mod/severe MR ? ?ROS: All systems negative except as listed in HPI, PMH and Problem List. ? ?SH:  ?Social History  ? ?Socioeconomic History  ? Marital status: Single  ?  Spouse name: Not on file  ? Number of children: Not on file  ? Years of education: Not on file  ? Highest education level: Not on file  ?Occupational History  ? Not on file  ?Tobacco Use  ? Smoking status: Former  ?  Types: Cigars  ?  Quit date: 10/11/2020  ?  Years since quitting: 1.2  ? Smokeless tobacco: Never  ? Tobacco comments:  ?  smokes 1 cigar a week  ?Substance and Sexual Activity  ? Alcohol use: No  ? Drug use: No  ? Sexual activity: Not on file  ?Other Topics Concern  ? Not on file  ?Social History Narrative  ? Not on file  ? ?Social Determinants of Health  ? ?Financial Resource Strain: High Risk  ? Difficulty of Paying Living Expenses: Very hard  ?Food Insecurity: Food Insecurity Present  ? Worried About Charity fundraiser in the Last Year: Sometimes true  ? Ran Out of Food in the Last Year: Sometimes true  ?Transportation Needs: No Transportation Needs  ? Lack of Transportation (Medical): No  ?  Lack of Transportation (Non-Medical): No  ?Physical Activity: Not on file  ?Stress: Stress Concern Present  ? Feeling of Stress : Very much  ?Social Connections: Not on file  ?Intimate Partner Violence: Not on file  ? ?FH:  ?Family History  ?Problem Relation Age of Onset  ? Heart attack Father   ? Sudden Cardiac Death Father 52  ? Diabetes Neg Hx   ? Hyperlipidemia Neg Hx   ? Hypertension Neg Hx   ? ?Past Medical History:  ?Diagnosis Date  ? Acute heart failure ([place]) 04/08/2021  ? Acute HFrEF (heart failure with reduced ejection fraction) ([place]) 04/07/2021  ? Arthritis   ?  hands, elbows bilaterally  ? Cancer [facility])   ? Full dentures   ? Hypertension   ? Pt states he doen not have HTN and has never been treated for HTN  ? Kidney stone on left side last stone 4-5 yrs ago  ? recurrent (3 episodes)  ? Panic disorder   ? pt states has resolved  ? Syncope   ? resolved- was related to panic attacks  ? ?Current Outpatient Medications  ?Medication Sig Dispense Refill  ? acetaminophen (TYLENOL) 500 MG tablet Take 500 mg by mouth every 6 (six) hours as needed.    ? albuterol (PROAIR HFA) 108 (90 Base) MCG/ACT inhaler Inhale 2 puffs into the lungs every 6 (six) hours as needed for wheezing or shortness of breath. 8.5 g 2  ? allopurinol (ZYLOPRIM) 300 MG tablet TAKE 1 TABLET BY MOUTH EVERY DAY 90 tablet 1  ? carvedilol (COREG) 3.125 MG tablet Take 1 tablet by mouth 2 times daily. 68 tablet 2  ? empagliflozin (JARDIANCE) 10 MG TABS tablet Take 1 tablet by mouth daily before breakfast. 30 tablet 11  ? eplerenone (INSPRA) 25 MG tablet Take 1/2 tablet (12.5 mg total) by mouth daily. 15 tablet 11  ? furosemide (LASIX) 20 MG tablet Take 1 tablet (20 mg total) by mouth daily as needed for swelling 100 tablet 2  ? ibrutinib 420 MG TABS Take 420 mg by mouth daily. 30 tablet 2  ? metoCLOPramide (REGLAN) 10 MG tablet Take 1 tablet (10 mg total) by mouth every 8 (eight) hours as needed for nausea. 30 tablet 1  ? ondansetron (ZOFRAN) 8 MG tablet Take 1 tablet by mouth every 8 hours as needed. 30 tablet 0  ? oxyCODONE-acetaminophen (PERCOCET) 5-325 MG tablet Take 1 tablet by mouth every 6 hours as needed for severe pain. 60 tablet 0  ? sacubitril-valsartan (ENTRESTO) 24-26 MG Take 1 tablet by mouth 2 (two) times daily. 60 tablet 5  ? ?No current facility-administered medications for this encounter.  ? ?BP 100/60   Pulse 62   Wt 65.6 kg (144 lb 9.6 oz)   SpO2 97%   BMI 21.99 kg/m?  ? ?Wt Readings from Last 3 Encounters:  ?01/05/22 65.6 kg (144 lb 9.6 oz)  ?12/30/21 66.9 kg (147 lb 8 oz)  ?12/23/21 66.5 kg  (146 lb 9.6 oz)  ? ?Physical Exam: ?General:  NAD. No resp difficulty, fatigued-appearing. ?HEENT: Normal ?Neck: Supple. No JVD. Carotids 2+ bilat; no bruits. No lymphadenopathy or thryomegaly appreciated. ?Cor: PMI nondisplaced. Regular rate & rhythm. No rubs, gallops or murmurs. ?Lungs: Clear ?Abdomen: Soft, nontender, nondistended. No hepatosplenomegaly. No bruits or masses. Good bowel sounds. ?Extremities: No cyanosis, clubbing, rash, edema ?Neuro: Alert & oriented x 3, cranial nerves grossly intact. Moves all 4 extremities w/o difficulty. Affect pleasant. ? ?ECG: SB 52 bpm (personally reviewed). ? ?  ASSESSMENT & PLAN:  ?1. Chronic Systolic Heart Failure ?- Stress Echo 2010: normal EF. No evidence of ischemia ?- Echo 6/22: EF 20-25%, GIIDD (restrictive), no LVH. RV normal ?- ? Viral CM (COVID + 2021), ? Familia (FH of [facility]). TSH normal, HIV pending ?- LHC/RHC (6/22): with normal cors, low filling pressures, and normal cardiac output ?- Also ? Infiltrative CM. cMRI EF 25%, normal RV, Basal septal midwall LGE, mild MR.  ?- MM panel urine immunofixation: IgM monoclonal protein with kappa light chain  ?Specificity -> found to have Waldstrom's Macroglobulinemia ?- Echo 09/14/21 EF 25% mild MR/AI  ?- Stable NYHA III. Volume status ok. Takes lasix as needed. Unclear if majority of symptoms related to HF versus his malignancy (suspect the latter) ?- Continue eplerenone 12.5 mg daily (gynecomastia with spiro).  ?- Continue Entresto 24/26 mg bid.  ?- Continue carvedilol 3.125 mg bid. ?- Continue Jardiance 10 mg daily.  ?- BP too low to titrate GDMT ?- Labs followed by Heme/Onc. ? ?2.  Mitral Regurgitation ?- Mod-severe, likely functional from dilated LV. ?- Mild on echo 12/22.  ?      ?3.  Waldenstrom's macroglobulinemia. ?- Now followed by Dr. Lorenso Courier with Heme/Onc ?- Completed Rituximab. Now continued on daily ibrutinib. ? ?4. SDOH ?- He now has Medicaid and disability is pending. ? ?Follow up in 3-4 months with Dr.  Haroldine Laws. ? ?Rafael Bihari, FNP  ?3:10 PM ? ?

## 2022-01-08 ENCOUNTER — Other Ambulatory Visit (HOSPITAL_COMMUNITY): Payer: Self-pay

## 2022-01-09 DIAGNOSIS — Z419 Encounter for procedure for purposes other than remedying health state, unspecified: Secondary | ICD-10-CM | POA: Diagnosis not present

## 2022-01-11 ENCOUNTER — Other Ambulatory Visit: Payer: Self-pay | Admitting: Hematology and Oncology

## 2022-01-11 ENCOUNTER — Other Ambulatory Visit: Payer: Self-pay | Admitting: Physician Assistant

## 2022-01-11 ENCOUNTER — Other Ambulatory Visit (HOSPITAL_COMMUNITY): Payer: Self-pay

## 2022-01-11 DIAGNOSIS — N62 Hypertrophy of breast: Secondary | ICD-10-CM

## 2022-01-12 ENCOUNTER — Other Ambulatory Visit: Payer: Self-pay | Admitting: Hematology and Oncology

## 2022-01-12 ENCOUNTER — Other Ambulatory Visit (HOSPITAL_COMMUNITY): Payer: Self-pay

## 2022-01-12 ENCOUNTER — Other Ambulatory Visit: Payer: Self-pay | Admitting: Physician Assistant

## 2022-01-12 ENCOUNTER — Telehealth: Payer: Self-pay | Admitting: *Deleted

## 2022-01-12 DIAGNOSIS — N62 Hypertrophy of breast: Secondary | ICD-10-CM

## 2022-01-12 MED ORDER — METOCLOPRAMIDE HCL 10 MG PO TABS
10.0000 mg | ORAL_TABLET | Freq: Three times a day (TID) | ORAL | 1 refills | Status: DC | PRN
  Filled 2022-01-12: qty 30, 10d supply, fill #0

## 2022-01-12 MED ORDER — OLANZAPINE 5 MG PO TABS: 5.0000 mg | ORAL_TABLET | Freq: Every day | ORAL | 3 refills | Status: DC

## 2022-01-12 MED ORDER — METOCLOPRAMIDE HCL 10 MG PO TABS: 10.0000 mg | ORAL_TABLET | Freq: Three times a day (TID) | ORAL | 1 refills | Status: DC | PRN

## 2022-01-12 MED ORDER — OLANZAPINE 5 MG PO TABS
5.0000 mg | ORAL_TABLET | Freq: Every day | ORAL | 3 refills | Status: DC
  Filled 2022-01-12: qty 30, 30d supply, fill #0

## 2022-01-12 MED ORDER — OXYCODONE-ACETAMINOPHEN 5-325 MG PO TABS: 1.0000 | ORAL_TABLET | Freq: Four times a day (QID) | ORAL | 0 refills | Status: DC | PRN

## 2022-01-12 NOTE — Telephone Encounter (Signed)
Medication Prior Authorization Status ? ?Processed CoverMyMeds KEY: [id number] ? ?Approved Today for one time fill. ? ?Per [facility] Medicaid ?Case ID: [id number]  ? ?Effective 01/12/2022 through 01/17/2022.  ?

## 2022-01-19 ENCOUNTER — Inpatient Hospital Stay: Payer: Medicaid Other

## 2022-01-19 ENCOUNTER — Other Ambulatory Visit (HOSPITAL_COMMUNITY): Payer: Self-pay

## 2022-01-19 ENCOUNTER — Inpatient Hospital Stay: Payer: Medicaid Other | Admitting: Hematology and Oncology

## 2022-01-19 ENCOUNTER — Encounter: Payer: Self-pay | Admitting: Hematology and Oncology

## 2022-01-19 ENCOUNTER — Telehealth: Payer: Self-pay | Admitting: Hematology and Oncology

## 2022-01-19 NOTE — Telephone Encounter (Signed)
Patient called to reschedule today's appointment due to feeling ill. ?

## 2022-01-20 ENCOUNTER — Other Ambulatory Visit (HOSPITAL_COMMUNITY): Payer: Self-pay

## 2022-01-20 MED ORDER — ALBUTEROL SULFATE HFA 108 (90 BASE) MCG/ACT IN AERS
INHALATION_SPRAY | RESPIRATORY_TRACT | 0 refills | Status: DC
  Filled 2022-01-20: qty 18, 25d supply, fill #0

## 2022-01-21 ENCOUNTER — Encounter: Payer: Self-pay | Admitting: Hematology and Oncology

## 2022-01-21 ENCOUNTER — Other Ambulatory Visit (HOSPITAL_COMMUNITY): Payer: Self-pay

## 2022-01-22 ENCOUNTER — Telehealth (HOSPITAL_COMMUNITY): Payer: Self-pay | Admitting: Pharmacy Technician

## 2022-01-22 ENCOUNTER — Other Ambulatory Visit (HOSPITAL_COMMUNITY): Payer: Self-pay

## 2022-01-22 ENCOUNTER — Other Ambulatory Visit: Payer: Self-pay | Admitting: Hematology and Oncology

## 2022-01-22 ENCOUNTER — Encounter: Payer: Self-pay | Admitting: Hematology and Oncology

## 2022-01-22 DIAGNOSIS — N62 Hypertrophy of breast: Secondary | ICD-10-CM

## 2022-01-22 NOTE — Telephone Encounter (Signed)
Patient Advocate Encounter ?  ?Received notification from [facility] that prior authorization for Jardiance is required. ?  ?PA submitted on CoverMyMeds ?Key [id number] ?Status is pending ?  ?Will continue to follow. ? ?

## 2022-01-22 NOTE — Telephone Encounter (Signed)
Advanced Heart Failure Patient Advocate Encounter ? ?Prior Authorization for Connor Solomon has been approved.   ? ?PA# [id number] ?Effective dates: 01/08/22 through 01/22/2023 ? ?Charlann Boxer, CPhT ? ? ?

## 2022-01-23 ENCOUNTER — Encounter (HOSPITAL_COMMUNITY): Payer: Self-pay

## 2022-01-23 ENCOUNTER — Emergency Department (HOSPITAL_COMMUNITY)
Admission: EM | Admit: 2022-01-23 | Discharge: 2022-01-23 | Disposition: A | Discharge status: Home / Self Care | Payer: Medicaid Other | Attending: Emergency Medicine | Admitting: Emergency Medicine

## 2022-01-23 ENCOUNTER — Other Ambulatory Visit: Payer: Self-pay

## 2022-01-23 ENCOUNTER — Other Ambulatory Visit (HOSPITAL_COMMUNITY): Payer: Self-pay

## 2022-01-23 DIAGNOSIS — R531 Weakness: Secondary | ICD-10-CM | POA: Diagnosis not present

## 2022-01-23 DIAGNOSIS — R4020 Unspecified coma: Secondary | ICD-10-CM | POA: Diagnosis not present

## 2022-01-23 DIAGNOSIS — Z743 Need for continuous supervision: Secondary | ICD-10-CM | POA: Diagnosis not present

## 2022-01-23 DIAGNOSIS — Z7984 Long term (current) use of oral hypoglycemic drugs: Secondary | ICD-10-CM | POA: Insufficient documentation

## 2022-01-23 DIAGNOSIS — Z79899 Other long term (current) drug therapy: Secondary | ICD-10-CM | POA: Insufficient documentation

## 2022-01-23 DIAGNOSIS — R0681 Apnea, not elsewhere classified: Secondary | ICD-10-CM | POA: Diagnosis present

## 2022-01-23 DIAGNOSIS — R7309 Other abnormal glucose: Secondary | ICD-10-CM | POA: Diagnosis not present

## 2022-01-23 DIAGNOSIS — R404 Transient alteration of awareness: Secondary | ICD-10-CM | POA: Diagnosis not present

## 2022-01-23 DIAGNOSIS — T40601A Poisoning by unspecified narcotics, accidental (unintentional), initial encounter: Secondary | ICD-10-CM | POA: Diagnosis not present

## 2022-01-23 DIAGNOSIS — T402X1A Poisoning by other opioids, accidental (unintentional), initial encounter: Secondary | ICD-10-CM | POA: Diagnosis not present

## 2022-01-23 DIAGNOSIS — R0689 Other abnormalities of breathing: Secondary | ICD-10-CM | POA: Diagnosis not present

## 2022-01-23 LAB — CBG MONITORING, ED: Glucose-Capillary: 104 mg/dL — ABNORMAL HIGH (ref 70–99)

## 2022-01-23 MED ORDER — NALOXONE HCL 4 MG/0.1ML NA LIQD: NASAL | 0 refills | Status: DC

## 2022-01-23 MED ORDER — NALOXONE HCL 4 MG/0.1ML NA LIQD
NASAL | 0 refills | Status: DC
  Filled 2022-01-23: qty 2, 2d supply, fill #0

## 2022-01-23 NOTE — Discharge Instructions (Addendum)
You were evaluated in the Emergency Department and after careful evaluation, we did not find any emergent condition requiring admission or further testing in the hospital. ? ?Your exam/testing today was overall reassuring. Your EKG and blood glucose were normal and you had no further signs of opoid toxicity after being administered Narcan by EMS ? ?Please return to the Emergency Department if you experience any worsening of your condition.  Thank you for allowing [place] to be a part of your care. ? ?

## 2022-01-23 NOTE — ED Provider Notes (Addendum)
?[facility] DEPT ?Provider Note ? ? ?CSN: [id number] ?Arrival date & time: 01/23/22  1336 ? ?  ? ?History ? ?Chief Complaint  ?Patient presents with  ? Drug Overdose  ? ? ?Connor Solomon is a 61 y.o. male. ? ? ?Drug Overdose ? ? ? 61 year old male presenting to the ED after an episode of unresponsiveness. Takes 5mg  Percocet for pain. Takes double doses sometimes. Last night, was helping a friend with yard work. Felt fine last night and took two Tylenols. Took Roxicodone 10mg  from a friend this morning. He then went unresponsive in a [employer] parking lot and was apneic in the presence of his wife. EMS arrived and bagged the patient for 10 minutes. EMS gave 0.5mg  Narcan en route. Deneis any other drug use today. Doesn't drink alcohol. Thinks the medicine he took was laced with Fentanyl. Arrived to the ED GCS 15, ABC intact. Feels fine now, just wants to go home. If very concerned he will lose access to his pain medications which he takes for chronic pain associated with Waldenstrom's macroglobulinemia. CBG 112. Vitally stable on arrival.  ? ?Home Medications ?Prior to Admission medications   ?Medication Sig Start Date End Date Taking? Authorizing Provider  ?naloxone [facility]) nasal spray 4 mg/0.1 mL Take for overdose 01/23/22  Yes Regan Lemming, MD  ?acetaminophen (TYLENOL) 500 MG tablet Take 500 mg by mouth every 6 (six) hours as needed.    [provider]  ?albuterol (VENTOLIN HFA) 108 (90 Base) MCG/ACT inhaler Inhale 2 puffs into the lungs every 6 hours as needed for wheezing or shortness of breath 06/10/21   Madalyn Rob, MD  ?allopurinol (ZYLOPRIM) 300 MG tablet TAKE 1 TABLET BY MOUTH EVERY DAY 12/31/21   Orson Slick, MD  ?carvedilol (COREG) 3.125 MG tablet Take 1 tablet by mouth 2 times daily. 10/23/21   Rafael Bihari, FNP  ?empagliflozin (JARDIANCE) 10 MG TABS tablet Take 1 tablet by mouth daily before breakfast. 07/15/21   Bensimhon, Shaune Pascal, MD  ?eplerenone  (INSPRA) 25 MG tablet Take 1/2 tablet (12.5 mg total) by mouth daily. 08/04/21   Rafael Bihari, FNP  ?furosemide (LASIX) 20 MG tablet Take 1 tablet (20 mg total) by mouth daily as needed for swelling 05/08/21     ?ibrutinib 420 MG TABS Take 420 mg by mouth daily. 11/05/21   Orson Slick, MD  ?metoCLOPramide (REGLAN) 10 MG tablet Take 1 tablet (10 mg total) by mouth every 8 (eight) hours as needed for nausea. 01/12/22   Orson Slick, MD  ?OLANZapine (ZYPREXA) 5 MG tablet Take 1 tablet (5 mg total) by mouth at bedtime. 01/12/22   Orson Slick, MD  ?ondansetron (ZOFRAN) 8 MG tablet Take 1 tablet by mouth every 8 hours as needed. 12/21/21   Lincoln Brigham, PA-C  ?oxyCODONE-acetaminophen (PERCOCET) 5-325 MG tablet Take 1 tablet by mouth every 6 hours as needed for severe pain. 01/12/22   Orson Slick, MD  ?sacubitril-valsartan (ENTRESTO) 24-26 MG Take 1 tablet by mouth 2 (two) times daily. 06/22/21   Rafael Bihari, FNP  ?   ? ?Allergies    ?Rituxan [rituximab] and Hydrocodone   ? ?Review of Systems   ?Review of Systems  ?All other systems reviewed and are negative. ? ?Physical Exam ?Updated Vital Signs ?BP 101/67   Pulse 91   Temp (!) 97.4 ?F (36.3 ?C) (Oral)   Resp 18   SpO2 95%  ?  Physical Exam ?Vitals and nursing note reviewed.  ?Constitutional:   ?   General: He is not in acute distress. ?HENT:  ?   Head: Normocephalic and atraumatic.  ?Eyes:  ?   Conjunctiva/sclera: Conjunctivae normal.  ?   Pupils: Pupils are equal, round, and reactive to light.  ?Cardiovascular:  ?   Rate and Rhythm: Normal rate and regular rhythm.  ?Pulmonary:  ?   Effort: Pulmonary effort is normal. No respiratory distress.  ?Abdominal:  ?   General: There is no distension.  ?   Tenderness: There is no guarding.  ?Musculoskeletal:     ?   General: No deformity or signs of injury.  ?   Cervical back: Neck supple.  ?Skin: ?   Findings: No lesion or rash.  ?Neurological:  ?   General: No focal deficit present.  ?   Mental  Status: He is alert. Mental status is at baseline.  ?Psychiatric:     ?   Attention and Perception: Attention and perception normal.     ?   Mood and Affect: Mood and affect normal.     ?   Speech: Speech normal.     ?   Behavior: Behavior normal. Behavior is cooperative.     ?   Thought Content: Thought content normal.  ? ? ?ED Results / Procedures / Treatments   ?Labs ?(all labs ordered are listed, but only abnormal results are displayed) ?Labs Reviewed  ?CBG MONITORING, ED - Abnormal; Notable for the following components:  ?    Result Value  ? Glucose-Capillary 104 (*)   ? All other components within normal limits  ? ? ?EKG ?EKG Interpretation ? ?Date/Time:  Saturday January 23 2022 14:40:50 EDT ?Ventricular Rate:  92 ?PR Interval:  147 ?QRS Duration: 101 ?QT Interval:  375 ?QTC Calculation: 464 ?R Axis:   52 ?Text Interpretation: Sinus rhythm Left ventricular hypertrophy Confirmed by Regan Lemming ([id number]) on 01/23/2022 2:46:22 PM ? ?Radiology ?No results found. ? ?Procedures ?Procedures  ? ? ?Medications Ordered in ED ?Medications - No data to display ? ?ED Course/ Medical Decision Making/ A&P ?Clinical Course as of 01/23/22 1446  ?Sat Jan 23, 2022  ?1442 Glucose-Capillary(!): 104 [JL]  ?  ?Clinical Course User Index ?[JL] Regan Lemming, MD  ? ?                        ?Medical Decision Making ?Amount and/or Complexity of Data Reviewed ?Labs:  Decision-making details documented in ED Course. ? ?Risk ?Prescription drug management. ? ? ?61 year old male presenting to the ED after an episode of unresponsiveness. Takes 5mg  Percocet for pain. Takes double doses sometimes. Last night, was helping a friend with yard work. Felt fine last night and took two Tylenols. Took Roxicodone 10mg  from a friend this morning. He then went unresponsive in a [employer] parking lot and was apneic in the presence of his wife. EMS arrived and bagged the patient for 10 minutes. EMS gave 0.5mg  Narcan en route. Deneis any other drug use today.  Doesn't drink alcohol. Thinks the medicine he took was laced with Fentanyl. Arrived to the ED GCS 15, ABC intact. Feels fine now, just wants to go home. If very concerned he will lose access to his pain medications which he takes for chronic pain associated with Waldenstrom's macroglobulinemia. CBG 112. Vitally stable on arrival.  ? ?The patient was observed on cardiac telemetry.  His CBG on arrival was normal.  His EKG was unremarkable with no abnormal intervals.  He had no further episodes of respiratory distress or altered mental status and remained GCS 15 throughout over an hour of observation in the emergency department.  He is requesting discharge.  I believe this is safe and reasonable.  Prescription for Narcan provided at discharge. ? ? ?Final Clinical Impression(s) / ED Diagnoses ?Final diagnoses:  ?Opiate overdose, accidental or unintentional, initial encounter ([place])  ? ? ?Rx / DC Orders ?ED Discharge Orders   ? ?      Ordered  ?  naloxone (NARCAN) nasal spray 4 mg/0.1 mL       ? 01/23/22 1445  ? ?  ?  ? ?  ? ? ?  ?Regan Lemming, MD ?01/23/22 1447 ? ?  ?Regan Lemming, MD ?01/23/22 1450 ? ?

## 2022-01-23 NOTE — ED Triage Notes (Signed)
Pt BIBA from [employer] parking lot. Pt unresponsive and apneic in presence of wife, EMS bagged pt for 10 min then pt regained consciousness. EMS believe the wife administered narcan prior to their arrival, but story unclear from wife. ? ?Pt has terminal cx. Prescribed percocet(60 on the 4th, now he is out). Pt also takes "xanax" that is bought from a street dealer. ? ?Given 0.5 narcan en route when O2 dropped into 80's. Came back up to 98 on RA. ? ?BP: 112/60 ?HR: 100 ?SPO2: 98 RA ?CBG: 112 ?

## 2022-01-25 ENCOUNTER — Inpatient Hospital Stay: Payer: Medicaid Other | Admitting: Hematology and Oncology

## 2022-01-25 ENCOUNTER — Inpatient Hospital Stay: Payer: Medicaid Other

## 2022-01-25 ENCOUNTER — Telehealth: Payer: Self-pay

## 2022-01-25 ENCOUNTER — Telehealth: Payer: Self-pay | Admitting: Hematology and Oncology

## 2022-01-25 ENCOUNTER — Encounter: Payer: Self-pay | Admitting: Hematology and Oncology

## 2022-01-25 ENCOUNTER — Other Ambulatory Visit (HOSPITAL_COMMUNITY): Payer: Self-pay

## 2022-01-25 NOTE — Telephone Encounter (Signed)
Transition Care Management Follow-up Telephone Call ?Date of discharge and from where: 01/23/2022-[place]  ?How have you been since you were released from the hospital? Pt stated he is ok but has a chest cold. Pt has been informed to contact PCP for evaluation for chest cold.  ?Any questions or concerns? No ? ?Items Reviewed: ?Did the pt receive and understand the discharge instructions provided? Yes  ?Medications obtained and verified? Yes  ?Other? No  ?Any new allergies since your discharge? No  ?Dietary orders reviewed? No ?Do you have support at home? Yes  ? ?Home Care and Equipment/Supplies: ?Were home health services ordered? not applicable ?If so, what is the name of the agency? N/A  ?Has the agency set up a time to come to the patient's home? not applicable ?Were any new equipment or medical supplies ordered?  No ?What is the name of the medical supply agency? N/A ?Were you able to get the supplies/equipment? not applicable ?Do you have any questions related to the use of the equipment or supplies? No ? ?Functional Questionnaire: (I = Independent and D = Dependent) ?ADLs: I ? ?Bathing/Dressing- I ? ?Meal Prep- I ? ?Eating- I ? ?Maintaining continence- I ? ?Transferring/Ambulation- I ? ?Managing Meds- I ? ?Follow up appointments reviewed: ? ?PCP Hospital f/u appt confirmed? No   ?Specialist Hospital f/u appt confirmed? No   ?Are transportation arrangements needed? No  ?If their condition worsens, is the pt aware to call PCP or go to the Emergency Dept.? Yes ?Was the patient provided with contact information for the PCP's office or ED? Yes ?Was to pt encouraged to call back with questions or concerns? Yes  ?

## 2022-01-25 NOTE — Telephone Encounter (Signed)
.  Called patient to schedule appointment per 4/17 inbasket, patient is aware of date and time.   ?

## 2022-01-26 ENCOUNTER — Telehealth: Payer: Self-pay | Admitting: *Deleted

## 2022-01-26 NOTE — Telephone Encounter (Signed)
Call from pt's significant other, Marlowe Kays. She stated pt needs to be seen today; he's c/o not being able to eat, he can barely talk, difficult breathing, weak, throwing up, coughing up phlegm. Stated he has a fever, she's unsure "maybe 101.0". She stated he has terminal cancer. And this was been going on for about 2-3 weeks. COVID test has not been done.Pt was seen in the ED 4/15 per his chart. Instructed SO to take pt to the ED; stated he does not want to 4 hrs to be seen. Instructed her to call EMS to evaluate pt. Reluctantly, she stated "ok". ?Please advise. ?

## 2022-01-27 ENCOUNTER — Other Ambulatory Visit (HOSPITAL_COMMUNITY): Payer: Self-pay

## 2022-01-27 ENCOUNTER — Telehealth: Payer: Self-pay | Admitting: *Deleted

## 2022-01-27 ENCOUNTER — Other Ambulatory Visit: Payer: Self-pay | Admitting: Hematology and Oncology

## 2022-01-27 DIAGNOSIS — N62 Hypertrophy of breast: Secondary | ICD-10-CM

## 2022-01-27 NOTE — Telephone Encounter (Signed)
Received call from pt's significant other, Marlowe Kays.  She states that Connor Solomon needs a refill of his pain medication for his back as he is out of them. Last filled on 01/12/22 for # 60. ?Pt was in the ED on 01/23/22 with overdose (not his Percocet apparently)  Advised that Dr. Lorenso Courier needs to see him before any more narcotics medications can be sent in. Arranged for pt to be seen tomorrow @ 2:20 pm instead of 02/01/22  Advised that we will discuss his pain medications at that time. Marlowe Kays voiced understanding and stated that pt can be here tomorrow. ?

## 2022-01-28 ENCOUNTER — Emergency Department (HOSPITAL_COMMUNITY): Payer: Medicaid Other

## 2022-01-28 ENCOUNTER — Other Ambulatory Visit: Payer: Self-pay

## 2022-01-28 ENCOUNTER — Emergency Department (HOSPITAL_COMMUNITY)
Admission: EM | Admit: 2022-01-28 | Discharge: 2022-01-28 | Disposition: A | Discharge status: Home / Self Care | Payer: Medicaid Other | Attending: Emergency Medicine | Admitting: Emergency Medicine

## 2022-01-28 ENCOUNTER — Encounter: Payer: Self-pay | Admitting: Nurse Practitioner

## 2022-01-28 ENCOUNTER — Other Ambulatory Visit (HOSPITAL_COMMUNITY): Payer: Self-pay

## 2022-01-28 ENCOUNTER — Inpatient Hospital Stay (HOSPITAL_BASED_OUTPATIENT_CLINIC_OR_DEPARTMENT_OTHER): Payer: Medicaid Other | Admitting: Hematology and Oncology

## 2022-01-28 ENCOUNTER — Inpatient Hospital Stay (HOSPITAL_BASED_OUTPATIENT_CLINIC_OR_DEPARTMENT_OTHER): Payer: Medicaid Other | Admitting: Nurse Practitioner

## 2022-01-28 ENCOUNTER — Inpatient Hospital Stay: Payer: Medicaid Other | Attending: Hematology and Oncology

## 2022-01-28 ENCOUNTER — Telehealth: Payer: Self-pay

## 2022-01-28 ENCOUNTER — Encounter: Payer: Self-pay | Admitting: Hematology and Oncology

## 2022-01-28 ENCOUNTER — Encounter (HOSPITAL_COMMUNITY): Payer: Self-pay

## 2022-01-28 VITALS — BP 112/64 | HR 85 | Temp 97.8°F | Resp 16 | Ht 68.0 in | Wt 131.5 lb

## 2022-01-28 DIAGNOSIS — M25551 Pain in right hip: Secondary | ICD-10-CM | POA: Insufficient documentation

## 2022-01-28 DIAGNOSIS — D72829 Elevated white blood cell count, unspecified: Secondary | ICD-10-CM | POA: Insufficient documentation

## 2022-01-28 DIAGNOSIS — I110 Hypertensive heart disease with heart failure: Secondary | ICD-10-CM | POA: Insufficient documentation

## 2022-01-28 DIAGNOSIS — J189 Pneumonia, unspecified organism: Secondary | ICD-10-CM | POA: Insufficient documentation

## 2022-01-28 DIAGNOSIS — R112 Nausea with vomiting, unspecified: Secondary | ICD-10-CM | POA: Diagnosis not present

## 2022-01-28 DIAGNOSIS — Z5941 Food insecurity: Secondary | ICD-10-CM | POA: Diagnosis not present

## 2022-01-28 DIAGNOSIS — I5022 Chronic systolic (congestive) heart failure: Secondary | ICD-10-CM | POA: Insufficient documentation

## 2022-01-28 DIAGNOSIS — D472 Monoclonal gammopathy: Secondary | ICD-10-CM

## 2022-01-28 DIAGNOSIS — R590 Localized enlarged lymph nodes: Secondary | ICD-10-CM | POA: Insufficient documentation

## 2022-01-28 DIAGNOSIS — Z515 Encounter for palliative care: Secondary | ICD-10-CM

## 2022-01-28 DIAGNOSIS — C880 Waldenstrom macroglobulinemia: Secondary | ICD-10-CM | POA: Diagnosis not present

## 2022-01-28 DIAGNOSIS — R944 Abnormal results of kidney function studies: Secondary | ICD-10-CM | POA: Insufficient documentation

## 2022-01-28 DIAGNOSIS — Z79899 Other long term (current) drug therapy: Secondary | ICD-10-CM | POA: Diagnosis not present

## 2022-01-28 DIAGNOSIS — Z8249 Family history of ischemic heart disease and other diseases of the circulatory system: Secondary | ICD-10-CM | POA: Diagnosis not present

## 2022-01-28 DIAGNOSIS — Z87891 Personal history of nicotine dependence: Secondary | ICD-10-CM | POA: Insufficient documentation

## 2022-01-28 DIAGNOSIS — R110 Nausea: Secondary | ICD-10-CM | POA: Diagnosis not present

## 2022-01-28 DIAGNOSIS — E876 Hypokalemia: Secondary | ICD-10-CM | POA: Diagnosis not present

## 2022-01-28 DIAGNOSIS — D649 Anemia, unspecified: Secondary | ICD-10-CM | POA: Insufficient documentation

## 2022-01-28 DIAGNOSIS — R9431 Abnormal electrocardiogram [ECG] [EKG]: Secondary | ICD-10-CM | POA: Diagnosis not present

## 2022-01-28 DIAGNOSIS — Z20822 Contact with and (suspected) exposure to covid-19: Secondary | ICD-10-CM | POA: Insufficient documentation

## 2022-01-28 DIAGNOSIS — R0989 Other specified symptoms and signs involving the circulatory and respiratory systems: Secondary | ICD-10-CM | POA: Insufficient documentation

## 2022-01-28 DIAGNOSIS — R0602 Shortness of breath: Secondary | ICD-10-CM | POA: Diagnosis not present

## 2022-01-28 DIAGNOSIS — K5900 Constipation, unspecified: Secondary | ICD-10-CM

## 2022-01-28 DIAGNOSIS — Z596 Low income: Secondary | ICD-10-CM | POA: Diagnosis not present

## 2022-01-28 DIAGNOSIS — N62 Hypertrophy of breast: Secondary | ICD-10-CM

## 2022-01-28 DIAGNOSIS — R059 Cough, unspecified: Secondary | ICD-10-CM | POA: Diagnosis not present

## 2022-01-28 DIAGNOSIS — T402X1A Poisoning by other opioids, accidental (unintentional), initial encounter: Secondary | ICD-10-CM | POA: Diagnosis not present

## 2022-01-28 DIAGNOSIS — M25552 Pain in left hip: Secondary | ICD-10-CM | POA: Diagnosis not present

## 2022-01-28 DIAGNOSIS — G893 Neoplasm related pain (acute) (chronic): Secondary | ICD-10-CM

## 2022-01-28 DIAGNOSIS — Z5112 Encounter for antineoplastic immunotherapy: Secondary | ICD-10-CM | POA: Diagnosis present

## 2022-01-28 LAB — URINALYSIS, ROUTINE W REFLEX MICROSCOPIC
Bacteria, UA: NONE SEEN
Bilirubin Urine: NEGATIVE
Glucose, UA: >=500 mg/dL — AB
Hgb urine dipstick: NEGATIVE
Ketones, ur: NEGATIVE mg/dL
Leukocytes,Ua: NEGATIVE
Nitrite: NEGATIVE
Protein, ur: 30 mg/dL — AB
Specific Gravity, Urine: 1.031 — ABNORMAL HIGH (ref 1.005–1.030)
pH: 6 (ref 5.0–8.0)

## 2022-01-28 LAB — CBC WITH DIFFERENTIAL/PLATELET
Abs Immature Granulocytes: 0.17 10*3/uL — ABNORMAL HIGH (ref 0.00–0.07)
Basophils Absolute: 0.1 10*3/uL (ref 0.0–0.1)
Basophils Relative: 0 %
Eosinophils Absolute: 0 10*3/uL (ref 0.0–0.5)
Eosinophils Relative: 0 %
HCT: 43.1 % (ref 39.0–52.0)
Hemoglobin: 14.2 g/dL (ref 13.0–17.0)
Immature Granulocytes: 1 %
Lymphocytes Relative: 3 %
Lymphs Abs: 0.9 10*3/uL (ref 0.7–4.0)
MCH: 27.1 pg (ref 26.0–34.0)
MCHC: 32.9 g/dL (ref 30.0–36.0)
MCV: 82.3 fL (ref 80.0–100.0)
Monocytes Absolute: 1.1 10*3/uL — ABNORMAL HIGH (ref 0.1–1.0)
Monocytes Relative: 4 %
Neutro Abs: 25 10*3/uL — ABNORMAL HIGH (ref 1.7–7.7)
Neutrophils Relative %: 92 %
Platelets: 488 10*3/uL — ABNORMAL HIGH (ref 150–400)
RBC: 5.24 MIL/uL (ref 4.22–5.81)
RDW: 15.1 % (ref 11.5–15.5)
WBC: 27.3 10*3/uL — ABNORMAL HIGH (ref 4.0–10.5)
nRBC: 0 % (ref 0.0–0.2)

## 2022-01-28 LAB — CBC WITH DIFFERENTIAL (CANCER CENTER ONLY)
Abs Immature Granulocytes: 0.17 10*3/uL — ABNORMAL HIGH (ref 0.00–0.07)
Basophils Absolute: 0 10*3/uL (ref 0.0–0.1)
Basophils Relative: 0 %
Eosinophils Absolute: 0 10*3/uL (ref 0.0–0.5)
Eosinophils Relative: 0 %
HCT: 36.8 % — ABNORMAL LOW (ref 39.0–52.0)
Hemoglobin: 12.4 g/dL — ABNORMAL LOW (ref 13.0–17.0)
Immature Granulocytes: 1 %
Lymphocytes Relative: 3 %
Lymphs Abs: 0.8 10*3/uL (ref 0.7–4.0)
MCH: 26.9 pg (ref 26.0–34.0)
MCHC: 33.7 g/dL (ref 30.0–36.0)
MCV: 79.8 fL — ABNORMAL LOW (ref 80.0–100.0)
Monocytes Absolute: 1.1 10*3/uL — ABNORMAL HIGH (ref 0.1–1.0)
Monocytes Relative: 4 %
Neutro Abs: 25.2 10*3/uL — ABNORMAL HIGH (ref 1.7–7.7)
Neutrophils Relative %: 92 %
Platelet Count: 475 10*3/uL — ABNORMAL HIGH (ref 150–400)
RBC: 4.61 MIL/uL (ref 4.22–5.81)
RDW: 14.9 % (ref 11.5–15.5)
WBC Count: 27.4 10*3/uL — ABNORMAL HIGH (ref 4.0–10.5)
nRBC: 0 % (ref 0.0–0.2)

## 2022-01-28 LAB — CMP (CANCER CENTER ONLY)
ALT: 8 U/L (ref 0–44)
AST: 11 U/L — ABNORMAL LOW (ref 15–41)
Albumin: 3.1 g/dL — ABNORMAL LOW (ref 3.5–5.0)
Alkaline Phosphatase: 118 U/L (ref 38–126)
Anion gap: 9 (ref 5–15)
BUN: 14 mg/dL (ref 6–20)
CO2: 27 mmol/L (ref 22–32)
Calcium: 8.8 mg/dL — ABNORMAL LOW (ref 8.9–10.3)
Chloride: 96 mmol/L — ABNORMAL LOW (ref 98–111)
Creatinine: 0.79 mg/dL (ref 0.61–1.24)
GFR, Estimated: >60 mL/min (ref >60)
Glucose, Bld: 135 mg/dL — ABNORMAL HIGH (ref 70–99)
Potassium: 3.1 mmol/L — ABNORMAL LOW (ref 3.5–5.1)
Sodium: 132 mmol/L — ABNORMAL LOW (ref 135–145)
Total Bilirubin: 0.7 mg/dL (ref 0.3–1.2)
Total Protein: 6.1 g/dL — ABNORMAL LOW (ref 6.5–8.1)

## 2022-01-28 LAB — MAGNESIUM: Magnesium: 1.8 mg/dL (ref 1.7–2.4)

## 2022-01-28 LAB — RESP PANEL BY RT-PCR (FLU A&B, COVID) ARPGX2
Influenza A by PCR: NEGATIVE
Influenza B by PCR: NEGATIVE
SARS Coronavirus 2 by RT PCR: NEGATIVE

## 2022-01-28 LAB — LACTIC ACID, PLASMA: Lactic Acid, Venous: 1.3 mmol/L (ref 0.5–1.9)

## 2022-01-28 LAB — LACTATE DEHYDROGENASE: LDH: 110 U/L (ref 98–192)

## 2022-01-28 MED ORDER — OXYCODONE-ACETAMINOPHEN 5-325 MG PO TABS
1.0000 | ORAL_TABLET | Freq: Once | ORAL | Status: AC
  Administered 2022-01-28: 1 via ORAL
  Filled 2022-01-28: qty 1

## 2022-01-28 MED ORDER — CEFDINIR 300 MG PO CAPS: 300.0000 mg | ORAL_CAPSULE | Freq: Two times a day (BID) | ORAL | 0 refills | Status: DC

## 2022-01-28 MED ORDER — SODIUM CHLORIDE 0.9 % IV SOLN
1.0000 g | Freq: Once | INTRAVENOUS | Status: AC
  Administered 2022-01-28: 1 g via INTRAVENOUS
  Filled 2022-01-28: qty 10

## 2022-01-28 MED ORDER — POTASSIUM CHLORIDE CRYS ER 20 MEQ PO TBCR
40.0000 meq | EXTENDED_RELEASE_TABLET | Freq: Once | ORAL | Status: AC
  Administered 2022-01-28: 40 meq via ORAL
  Filled 2022-01-28: qty 2

## 2022-01-28 MED ORDER — METHADONE HCL 5 MG PO TABS: 5.0000 mg | ORAL_TABLET | Freq: Two times a day (BID) | ORAL | 0 refills | Status: DC

## 2022-01-28 MED ORDER — AZITHROMYCIN 250 MG PO TABS: 250.0000 mg | ORAL_TABLET | Freq: Every day | ORAL | 0 refills | Status: AC

## 2022-01-28 MED ORDER — POTASSIUM CHLORIDE CRYS ER 20 MEQ PO TBCR: 20.0000 meq | EXTENDED_RELEASE_TABLET | Freq: Every day | ORAL | 0 refills | Status: DC

## 2022-01-28 MED ORDER — SODIUM CHLORIDE 0.9 % IV SOLN
500.0000 mg | Freq: Once | INTRAVENOUS | Status: AC
  Administered 2022-01-28: 500 mg via INTRAVENOUS
  Filled 2022-01-28: qty 5

## 2022-01-28 NOTE — Telephone Encounter (Signed)
Patient wheeled to ED and handed over to triage nurse. ?

## 2022-01-28 NOTE — ED Provider Triage Note (Signed)
Emergency Medicine Provider Triage Evaluation Note ? ?Connor Solomon , a 61 y.o. male  was evaluated in triage.  Pt complains of productive cough for the past 2 weeks.  Patient was reevaluated at the cancer center earlier today and was found to have a significant leukocytosis and was sent to the emergency department for pneumonia rule out.  Reports associated shortness of breath.  Denies any chest pain. ? ?Physical Exam  ?BP 132/78 (BP Location: Right Arm)   Pulse 86   Temp 98 ?F (36.7 ?C) (Oral)   Resp 16   Ht $R'5\' 8"'RR$  (1.727 m)   Wt 59.6 kg   SpO2 95%   BMI 19.99 kg/m?  ?Gen:   Awake, no distress   ?Resp:  Normal effort  ?MSK:   Moves extremities without difficulty  ?Other:   ? ?Medical Decision Making  ?Medically screening exam initiated at 3:50 PM.  Appropriate orders placed.  Connor Solomon was informed that the remainder of the evaluation will be completed by another provider, this initial triage assessment does not replace that evaluation, and the importance of remaining in the ED until their evaluation is complete. ?  ?Rayna Sexton, PA-C ?01/28/22 1551 ? ?

## 2022-01-28 NOTE — Discharge Instructions (Addendum)
It was our pleasure to provide your ER care today - we hope that you feel better. ? ?Drink plenty of fluids/stay well hydrated. Take antibiotics (omnicef + zithromax) as prescribed.  ? ?From today's labs your potassium level is low  - eat plenty of fruits and vegetables, take potassium supplement as prescribed, and follow up with your doctor for recheck in one week.  ? ?Follow up with your doctor in one week. ? ?Return to ER if worse, new symptoms, increased trouble breathing, chest pain, severe abdominal pain, persistent vomiting, weak/fainting, or other concern.  ? ?You were given pain meds in the ER - no driving for the next 6 hours, or if/when taking opiate pain meds.  ?

## 2022-01-28 NOTE — ED Provider Notes (Signed)
?[facility] DEPT ?Provider Note ? ? ?CSN: [id number] ?Arrival date & time: 01/28/22  1525 ? ?  ? ?History ? ?Chief Complaint  ?Patient presents with  ? Abnormal Lab  ? ? ?Connor Solomon is a 61 y.o. male. ? ?Pt with hx Waldenstrom's, sent from oncology office with elevated white count, cough in past couple weeks, for further evaluation. Pt notes prod cough w yellowish sputum. No hemoptysis. Denies sore throat. Mild sob. States general chest and abd discomfort, esp w coughing. +nausea. No vomiting. Having normal bms. No dysuria. Denies headache. No neck pain or stiffness.  ? ?The history is provided by the patient and medical records.  ?Abnormal Lab ? ?  ? ?Home Medications ?Prior to Admission medications   ?Medication Sig Start Date End Date Taking? Authorizing Provider  ?acetaminophen (TYLENOL) 500 MG tablet Take 500 mg by mouth every 6 (six) hours as needed.    [provider]  ?albuterol (VENTOLIN HFA) 108 (90 Base) MCG/ACT inhaler Inhale 2 puffs into the lungs every 6 hours as needed for wheezing or shortness of breath 06/10/21   Madalyn Rob, MD  ?allopurinol (ZYLOPRIM) 300 MG tablet TAKE 1 TABLET BY MOUTH EVERY DAY 12/31/21   Orson Slick, MD  ?carvedilol (COREG) 3.125 MG tablet Take 1 tablet by mouth 2 times daily. 10/23/21   Rafael Bihari, FNP  ?empagliflozin (JARDIANCE) 10 MG TABS tablet Take 1 tablet by mouth daily before breakfast. 07/15/21   Bensimhon, Shaune Pascal, MD  ?eplerenone (INSPRA) 25 MG tablet Take 1/2 tablet (12.5 mg total) by mouth daily. 08/04/21   Rafael Bihari, FNP  ?furosemide (LASIX) 20 MG tablet Take 1 tablet (20 mg total) by mouth daily as needed for swelling 05/08/21     ?ibrutinib 420 MG TABS Take 420 mg by mouth daily. 11/05/21   Orson Slick, MD  ?metoCLOPramide (REGLAN) 10 MG tablet Take 1 tablet (10 mg total) by mouth every 8 (eight) hours as needed for nausea. 01/12/22   Orson Slick, MD  ?naloxone [facility]) nasal spray 4 mg/0.1  mL Take for overdose 01/23/22   Regan Lemming, MD  ?naloxone [facility]) nasal spray 4 mg/0.1 mL Take for overdose as directed 01/23/22   Regan Lemming, MD  ?OLANZapine (ZYPREXA) 5 MG tablet Take 1 tablet (5 mg total) by mouth at bedtime. 01/12/22   Orson Slick, MD  ?ondansetron (ZOFRAN) 8 MG tablet Take 1 tablet by mouth every 8 hours as needed. 12/21/21   Lincoln Brigham, PA-C  ?oxyCODONE-acetaminophen (PERCOCET) 5-325 MG tablet Take 1 tablet by mouth every 6 hours as needed for severe pain. 01/12/22   Orson Slick, MD  ?sacubitril-valsartan (ENTRESTO) 24-26 MG Take 1 tablet by mouth 2 (two) times daily. 06/22/21   Rafael Bihari, FNP  ?   ? ?Allergies    ?Rituxan [rituximab] and Hydrocodone   ? ?Review of Systems   ?Review of Systems  ?Constitutional:  Negative for fever.  ?HENT:  Negative for sore throat.   ?Eyes:  Negative for redness.  ?Respiratory:  Positive for cough and shortness of breath.   ?Cardiovascular:  Positive for chest pain. Negative for leg swelling.  ?Gastrointestinal:  Positive for nausea. Negative for diarrhea.  ?Genitourinary:  Negative for dysuria and flank pain.  ?Musculoskeletal:  Negative for back pain and neck pain.  ?Skin:  Negative for rash.  ?Neurological:  Negative for headaches.  ?Hematological:  Does not bruise/bleed easily.  ?Psychiatric/Behavioral:  Negative for confusion.   ? ?Physical Exam ?Updated Vital Signs ?BP 127/75   Pulse 80   Temp 98 ?F (36.7 ?C) (Oral)   Resp 18   Ht 1.727 m ($Remove'5\' 8"'QZlzpbP$ )   Wt 59.6 kg   SpO2 96%   BMI 19.99 kg/m?  ?Physical Exam ?Vitals and nursing note reviewed.  ?Constitutional:   ?   Appearance: Normal appearance. He is well-developed.  ?HENT:  ?   Head: Atraumatic.  ?   Nose: Nose normal.  ?   Mouth/Throat:  ?   Mouth: Mucous membranes are moist.  ?   Pharynx: Oropharynx is clear.  ?Eyes:  ?   General: No scleral icterus. ?   Conjunctiva/sclera: Conjunctivae normal.  ?Neck:  ?   Trachea: No tracheal deviation.  ?   Comments: No stiffness or  rigidity.  ?Cardiovascular:  ?   Rate and Rhythm: Normal rate and regular rhythm.  ?   Pulses: Normal pulses.  ?   Heart sounds: Normal heart sounds. No murmur heard. ?  No friction rub. No gallop.  ?Pulmonary:  ?   Effort: Pulmonary effort is normal. No accessory muscle usage or respiratory distress.  ?   Breath sounds: Rhonchi and rales present.  ?Chest:  ?   Chest wall: No tenderness.  ?Abdominal:  ?   General: Bowel sounds are normal. There is no distension.  ?   Palpations: Abdomen is soft. There is no mass.  ?   Tenderness: There is no abdominal tenderness. There is no guarding.  ?Genitourinary: ?   Comments: No cva tenderness. ?Musculoskeletal:     ?   General: No swelling or tenderness.  ?   Cervical back: Normal range of motion and neck supple. No rigidity.  ?   Right lower leg: No edema.  ?   Left lower leg: No edema.  ?Skin: ?   General: Skin is warm and dry.  ?   Findings: No erythema or rash.  ?Neurological:  ?   Mental Status: He is alert.  ?   Comments: Alert, speech clear. Motor/sens grossly intact bil.   ?Psychiatric:     ?   Mood and Affect: Mood normal.  ? ? ?ED Results / Procedures / Treatments   ?Labs ?(all labs ordered are listed, but only abnormal results are displayed) ?Results for orders placed or performed during the hospital encounter of 01/28/22  ?Resp Panel by RT-PCR (Flu A&B, Covid) Nasopharyngeal Swab  ? Specimen: Nasopharyngeal Swab; Nasopharyngeal(NP) swabs in vial transport medium  ?Result Value Ref Range  ? SARS Coronavirus 2 by RT PCR NEGATIVE NEGATIVE  ? Influenza A by PCR NEGATIVE NEGATIVE  ? Influenza B by PCR NEGATIVE NEGATIVE  ?Lactic acid, plasma  ?Result Value Ref Range  ? Lactic Acid, Venous 1.3 0.5 - 1.9 mmol/L  ?CBC with Differential  ?Result Value Ref Range  ? WBC 27.3 (H) 4.0 - 10.5 K/uL  ? RBC 5.24 4.22 - 5.81 MIL/uL  ? Hemoglobin 14.2 13.0 - 17.0 g/dL  ? HCT 43.1 39.0 - 52.0 %  ? MCV 82.3 80.0 - 100.0 fL  ? MCH 27.1 26.0 - 34.0 pg  ? MCHC 32.9 30.0 - 36.0 g/dL  ? RDW  15.1 11.5 - 15.5 %  ? Platelets 488 (H) 150 - 400 K/uL  ? nRBC 0.0 0.0 - 0.2 %  ? Neutrophils Relative % PENDING %  ? Neutro Abs PENDING 1.7 - 7.7 K/uL  ? Band Neutrophils PENDING %  ? Lymphocytes Relative  PENDING %  ? Lymphs Abs PENDING 0.7 - 4.0 K/uL  ? Monocytes Relative PENDING %  ? Monocytes Absolute PENDING 0.1 - 1.0 K/uL  ? Eosinophils Relative PENDING %  ? Eosinophils Absolute PENDING 0.0 - 0.5 K/uL  ? Basophils Relative PENDING %  ? Basophils Absolute PENDING 0.0 - 0.1 K/uL  ? WBC Morphology PENDING   ? RBC Morphology PENDING   ? Smear Review PENDING   ? Other PENDING %  ? nRBC PENDING 0 /100 WBC  ? Metamyelocytes Relative PENDING %  ? Myelocytes PENDING %  ? Promyelocytes Relative PENDING %  ? Blasts PENDING %  ? Immature Granulocytes PENDING %  ? Abs Immature Granulocytes PENDING 0.00 - 0.07 K/uL  ?Magnesium  ?Result Value Ref Range  ? Magnesium 1.8 1.7 - 2.4 mg/dL  ? ?DG Chest 2 View ? ?Result Date: 01/28/2022 ?CLINICAL DATA:  Productive cough, shortness of breath EXAM: CHEST - 2 VIEW COMPARISON:  07/03/2021 FINDINGS: Cardiac size is within normal limits. There are new patchy infiltrates in the posterior lower lung fields on both sides. Findings suggest multifocal pneumonia in both lower lobes. Follow-up studies until complete clearing occurs should be considered to rule out any underlying obstructing lesions. Increase in AP diameter of chest suggests COPD. There are no signs of alveolar pulmonary edema. There is no pleural effusion or pneumothorax. IMPRESSION: There are new patchy infiltrates in the both lower lobes suggesting multifocal pneumonia. Follow-up studies should be considered to assess resolution. Electronically Signed   By: Elmer Picker M.D.   On: 01/28/2022 16:32   ? ? ?EKG ?None ? ?Radiology ?DG Chest 2 View ? ?Result Date: 01/28/2022 ?CLINICAL DATA:  Productive cough, shortness of breath EXAM: CHEST - 2 VIEW COMPARISON:  07/03/2021 FINDINGS: Cardiac size is within normal limits.  There are new patchy infiltrates in the posterior lower lung fields on both sides. Findings suggest multifocal pneumonia in both lower lobes. Follow-up studies until complete clearing occurs should be considered

## 2022-01-28 NOTE — ED Notes (Signed)
Pt in X-Ray ?

## 2022-01-28 NOTE — Progress Notes (Signed)
?[facility] ?Telephone:([phone]   [phone] ? ?PROGRESS NOTE ? ?Patient Care Team: ?Delene Ruffini, MD as PCP - General (Internal Medicine) ? ?Hematological/Oncological History ?# Waldenstrom's Macroglobulinemia ?# IgM Monoclonal Gammopathy ?04/07/2021: CT A/p showed mildly enlarged retroperitoneal lymph nodes and bilateral inguinal lymph nodes.  ?04/08/2021: IgM Kappa M protein 1.2 ?04/09/2021: WBC 11.1, Hgb 9.5, MCV 75.4, Plt 525 ?04/20/2021: Kappa 977, Lambda 7.8, K/L ratio 125.28. Serum viscosity 1.8 ?05/06/2021: Establish care with Dr. 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 Infusion reaction with ritux, held halfway through.  ?06/12/2021: Cycle 2 Day 1 of Ibrutinib + Rituximab ?06/19/2021: Cycle 3 Day 1 of Ibrutinib + Rituximab ?06/26/2021: Cycle 4 Day 1 of rituximab. Continued on daily ibrutinib ?12/02/2021: Cycle 5 Day 1 of Ibrutinib + Rituximab ?12/09/2021: Cycle 6 Day 1 of Ibrutinib + Rituximab ?12/16/2021: Cycle 7 Day 1 of Ibrutinib + Rituximab ?12/23/2021: Held Cycle 8 of Ritxumab due to nausea/vomiting, elevated creatinine, finger infection ? ?  ?Interval History:  ?Connor Solomon 61 y.o. male with medical history significant for Waldenstrom's macroglobulinemia who presents for a follow up visit. The patient's last visit was on 12/23/2021. In the interim since the last visit he has not received his final rituximab dose and unfortunately had an emergency room visit for opioid overdose. ? ?On exam today Connor Solomon reports he has been doing poorly over the last 2 weeks.  He notes that he has been having a lot of pain in his joints, his hips, and his back.  He notes he is also had a chest cold which is persisted for about 2 weeks.  He notes he has been coughing up phlegm and sometimes coughs so hard  that he throws up.  He notes that for the past 4 to 5 days he has not had any vomiting.  He reports that he had a 17 pound weight drop in the last 2 months.  He has had some fevers on occasion but no sweats or chills.  He notes that he also feels that he is having difficulty getting air and is only able to walk short distances.  ? ?In regards to his opioid overdose apparently he ran out of his pain medications and borrowed a pain pill from a friend.  He reports it was a "little pink pill" that must have been fentanyl.  He did stop breathing and unfortunately EMS was called.  They were able to resuscitate him.  Despite all he has been through lately he reports that he very rarely misses one of his chemotherapy pills.  He denies fevers, chills, night sweats, chest pain, headache or vision changes. Full 10 point ROS is listed below. ? ?He was agreeable to a referral to palliative care in order to help manage his pain medications in the setting of a recent opioid overdose. ? ?MEDICAL HISTORY:  ?Past Medical History:  ?Diagnosis Date  ? Acute heart failure ([place]) 04/08/2021  ? Acute HFrEF (heart failure with reduced ejection fraction) ([place]) 04/07/2021  ? Arthritis   ? hands, elbows bilaterally  ? Cancer [facility])   ? Full dentures   ? Hypertension   ? Pt states he doen not have HTN and has never been treated for HTN  ? Kidney stone on left side last stone 4-5 yrs ago  ? recurrent (3 episodes)  ? Panic disorder   ?  pt states has resolved  ? Syncope   ? resolved- was related to panic attacks  ? ? ?SURGICAL HISTORY: ?Past Surgical History:  ?Procedure Laterality Date  ? CARPAL METACARPAL FUSION WITH DISTAL RADIAL BONE GRAFT Left 05/30/2013  ? Procedure: LEFT THUMB METACARPAL JOINT FUSION;  Surgeon: Schuyler Amor, MD;  Location: [place];  Service: Orthopedics;  Laterality: Left;  ? ESOPHAGOGASTRODUODENOSCOPY N/A 02/12/2014  ? Procedure: ESOPHAGOGASTRODUODENOSCOPY (EGD);  Surgeon: Jerene Bears, MD;  Location:  [facility] ENDOSCOPY;  Service: Endoscopy;  Laterality: N/A;  ? FOREIGN BODY REMOVAL N/A 02/12/2014  ? Procedure: FOREIGN BODY REMOVAL;  Surgeon: Jerene Bears, MD;  Location: [facility] ENDOSCOPY;  Service: Endoscopy;  Laterality: N/A;  ? OPEN REDUCTION INTERNAL FIXATION (ORIF) DISTAL RADIAL FRACTURE Left 11/29/2012  ? Procedure: OPEN REDUCTION INTERNAL FIXATION (ORIF) DISTAL RADIAL FRACTURE;  Surgeon: Schuyler Amor, MD;  Location: [place];  Service: Orthopedics;  Laterality: Left;  LEFT DISTAL RADIUS OSTEOTOMY WITH BONE GRAFT  ? RIGHT/LEFT HEART CATH AND CORONARY ANGIOGRAPHY N/A 04/09/2021  ? Procedure: RIGHT/LEFT HEART CATH AND CORONARY ANGIOGRAPHY;  Surgeon: Jolaine Artist, MD;  Location: [place] CV LAB;  Service: Cardiovascular;  Laterality: N/A;  ? TENDON REPAIR Left 02/07/2013  ? Procedure: LEFT EXTENSOR INDICIS PROPRIUS  TO EXTENSOR POLLICIS LONGUS TENDON TRANSFER;  Surgeon: Schuyler Amor, MD;  Location: [place];  Service: Orthopedics;  Laterality: Left;  ? WISDOM TOOTH EXTRACTION    ? WRIST SURGERY    ? fx only  ? ? ?SOCIAL HISTORY: ?Social History  ? ?Socioeconomic History  ? Marital status: Single  ?  Spouse name: Not on file  ? Number of children: Not on file  ? Years of education: Not on file  ? Highest education level: Not on file  ?Occupational History  ? Not on file  ?Tobacco Use  ? Smoking status: Former  ?  Types: Cigars  ?  Quit date: 10/11/2020  ?  Years since quitting: 1.2  ? Smokeless tobacco: Never  ? Tobacco comments:  ?  smokes 1 cigar a week  ?Vaping Use  ? Vaping Use: Never used  ?Substance and Sexual Activity  ? Alcohol use: No  ? Drug use: No  ? Sexual activity: Not on file  ?Other Topics Concern  ? Not on file  ?Social History Narrative  ? Not on file  ? ?Social Determinants of Health  ? ?Financial Resource Strain: High Risk  ? Difficulty of Paying Living Expenses: Very hard  ?Food Insecurity: Food Insecurity Present  ? Worried About Charity fundraiser in  the Last Year: Sometimes true  ? Ran Out of Food in the Last Year: Sometimes true  ?Transportation Needs: No Transportation Needs  ? Lack of Transportation (Medical): No  ? Lack of Transportation (Non-Medical): No  ?Physical Activity: Not on file  ?Stress: Stress Concern Present  ? Feeling of Stress : Very much  ?Social Connections: Not on file  ?Intimate Partner Violence: Not on file  ? ? ?FAMILY HISTORY: ?Family History  ?Problem Relation Age of Onset  ? Heart attack Father   ? Sudden Cardiac Death Father 91  ? Diabetes Neg Hx   ? Hyperlipidemia Neg Hx   ? Hypertension Neg Hx   ? ? ?ALLERGIES:  is allergic to rituxan [rituximab] and hydrocodone. ? ?MEDICATIONS:  ?Current Outpatient Medications  ?Medication Sig Dispense Refill  ? acetaminophen (TYLENOL) 500 MG tablet Take 500 mg by mouth every 6 (six)  hours as needed.    ? albuterol (VENTOLIN HFA) 108 (90 Base) MCG/ACT inhaler Inhale 2 puffs into the lungs every 6 hours as needed for wheezing or shortness of breath 18 g 0  ? allopurinol (ZYLOPRIM) 300 MG tablet TAKE 1 TABLET BY MOUTH EVERY DAY 90 tablet 1  ? carvedilol (COREG) 3.125 MG tablet Take 1 tablet by mouth 2 times daily. 68 tablet 2  ? empagliflozin (JARDIANCE) 10 MG TABS tablet Take 1 tablet by mouth daily before breakfast. 30 tablet 11  ? eplerenone (INSPRA) 25 MG tablet Take 1/2 tablet (12.5 mg total) by mouth daily. 15 tablet 11  ? furosemide (LASIX) 20 MG tablet Take 1 tablet (20 mg total) by mouth daily as needed for swelling 100 tablet 2  ? ibrutinib 420 MG TABS Take 420 mg by mouth daily. 30 tablet 2  ? metoCLOPramide (REGLAN) 10 MG tablet Take 1 tablet (10 mg total) by mouth every 8 (eight) hours as needed for nausea. 30 tablet 1  ? naloxone (NARCAN) nasal spray 4 mg/0.1 mL Take for overdose 1 each 0  ? naloxone (NARCAN) nasal spray 4 mg/0.1 mL Take for overdose as directed 2 each 0  ? OLANZapine (ZYPREXA) 5 MG tablet Take 1 tablet (5 mg total) by mouth at bedtime. 30 tablet 3  ? ondansetron  (ZOFRAN) 8 MG tablet Take 1 tablet by mouth every 8 hours as needed. 30 tablet 0  ? oxyCODONE-acetaminophen (PERCOCET) 5-325 MG tablet Take 1 tablet by mouth every 6 hours as needed for severe pain. 60 ta

## 2022-01-28 NOTE — ED Notes (Signed)
Pt states understanding of dc instructions and importance of follow up. Pt denies questions or concerns and transportation assistance given upon dc. No belongings left in room upon dc. ? ?

## 2022-01-28 NOTE — ED Triage Notes (Signed)
Patient was brought from the [facility]. WBC- 27.4. Patient reports an intermittent fever x 2 weeks. Patient also reports a productive cough with white and dark yellow sputum. ? ? ?

## 2022-01-28 NOTE — Progress Notes (Signed)
? ?  ?Palliative Medicine ?[place]  ?[phone] [phone] ? ? ?Name: Connor Solomon ?Date: 01/28/2022 ?MRN: [id number]  ?DOB: January 26, 1961 ? ?Patient Care Team: ?Delene Ruffini, MD as PCP - General (Internal Medicine)  ? ? ?REASON FOR CONSULTATION: ?Connor Solomon is a 61 y.o. 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 Palliative ask to see for symptom management. ? ?SOCIAL HISTORY:    ? reports that he quit smoking about 15 months ago. His smoking use included cigars. He has never used smokeless tobacco. He reports that he does not drink alcohol and does not use drugs. ? ?ADVANCE DIRECTIVES:  ? ? ?CODE STATUS:  ? ?PAST MEDICAL HISTORY: ?Past Medical History:  ?Diagnosis Date  ? Acute heart failure ([place]) 04/08/2021  ? Acute HFrEF (heart failure with reduced ejection fraction) ([place]) 04/07/2021  ? Arthritis   ? hands, elbows bilaterally  ? Cancer [facility])   ? Full dentures   ? Hypertension   ? Pt states he doen not have HTN and has never been treated for HTN  ? Kidney stone on left side last stone 4-5 yrs ago  ? recurrent (3 episodes)  ? Panic disorder   ? pt states has resolved  ? Syncope   ? resolved- was related to panic attacks  ? ? ?PAST SURGICAL HISTORY:  ?Past Surgical History:  ?Procedure Laterality Date  ? CARPAL METACARPAL FUSION WITH DISTAL RADIAL BONE GRAFT Left 05/30/2013  ? Procedure: LEFT THUMB METACARPAL JOINT FUSION;  Surgeon: Schuyler Amor, MD;  Location: [place];  Service: Orthopedics;  Laterality: Left;  ? ESOPHAGOGASTRODUODENOSCOPY N/A 02/12/2014  ? Procedure: ESOPHAGOGASTRODUODENOSCOPY (EGD);  Surgeon: Jerene Bears, MD;  Location: [facility] ENDOSCOPY;  Service: Endoscopy;  Laterality: N/A;  ? FOREIGN BODY REMOVAL N/A 02/12/2014  ? Procedure: FOREIGN BODY REMOVAL;  Surgeon: Jerene Bears, MD;  Location: [facility] ENDOSCOPY;  Service: Endoscopy;  Laterality: N/A;  ?  OPEN REDUCTION INTERNAL FIXATION (ORIF) DISTAL RADIAL FRACTURE Left 11/29/2012  ? Procedure: OPEN REDUCTION INTERNAL FIXATION (ORIF) DISTAL RADIAL FRACTURE;  Surgeon: Schuyler Amor, MD;  Location: [place];  Service: Orthopedics;  Laterality: Left;  LEFT DISTAL RADIUS OSTEOTOMY WITH BONE GRAFT  ? RIGHT/LEFT HEART CATH AND CORONARY ANGIOGRAPHY N/A 04/09/2021  ? Procedure: RIGHT/LEFT HEART CATH AND CORONARY ANGIOGRAPHY;  Surgeon: Jolaine Artist, MD;  Location: [place] CV LAB;  Service: Cardiovascular;  Laterality: N/A;  ? TENDON REPAIR Left 02/07/2013  ? Procedure: LEFT EXTENSOR INDICIS PROPRIUS  TO EXTENSOR POLLICIS LONGUS TENDON TRANSFER;  Surgeon: Schuyler Amor, MD;  Location: [place];  Service: Orthopedics;  Laterality: Left;  ? WISDOM TOOTH EXTRACTION    ? WRIST SURGERY    ? fx only  ? ? ?HEMATOLOGY/ONCOLOGY HISTORY:  ?Oncology History  ?Waldenstrom macroglobulinemia ([place])  ?05/24/2021 Initial Diagnosis  ? Waldenstrom macroglobulinemia ([place]) ? ?  ?06/05/2021 -  Chemotherapy  ? Patient is on Treatment Plan : NON-HODGKINS LYMPHOMA Rituximab q7d  ? ?  ?  ? ? ?ALLERGIES:  is allergic to rituxan [rituximab] and hydrocodone. ? ?MEDICATIONS:  ?Current Outpatient Medications  ?Medication Sig Dispense Refill  ? acetaminophen (TYLENOL) 500 MG tablet Take 500 mg by mouth every 6 (six) hours as needed.    ? albuterol (VENTOLIN HFA) 108 (90 Base) MCG/ACT inhaler Inhale 2 puffs into the lungs every 6 hours as needed for wheezing or shortness of breath 18  g 0  ? allopurinol (ZYLOPRIM) 300 MG tablet TAKE 1 TABLET BY MOUTH EVERY DAY 90 tablet 1  ? carvedilol (COREG) 3.125 MG tablet Take 1 tablet by mouth 2 times daily. 68 tablet 2  ? empagliflozin (JARDIANCE) 10 MG TABS tablet Take 1 tablet by mouth daily before breakfast. 30 tablet 11  ? eplerenone (INSPRA) 25 MG tablet Take 1/2 tablet (12.5 mg total) by mouth daily. 15 tablet 11  ? furosemide (LASIX) 20 MG tablet Take 1 tablet  (20 mg total) by mouth daily as needed for swelling 100 tablet 2  ? ibrutinib 420 MG TABS Take 420 mg by mouth daily. 30 tablet 2  ? metoCLOPramide (REGLAN) 10 MG tablet Take 1 tablet (10 mg total) by mouth every 8 (eight) hours as needed for nausea. 30 tablet 1  ? naloxone (NARCAN) nasal spray 4 mg/0.1 mL Take for overdose 1 each 0  ? naloxone (NARCAN) nasal spray 4 mg/0.1 mL Take for overdose as directed 2 each 0  ? OLANZapine (ZYPREXA) 5 MG tablet Take 1 tablet (5 mg total) by mouth at bedtime. 30 tablet 3  ? ondansetron (ZOFRAN) 8 MG tablet Take 1 tablet by mouth every 8 hours as needed. 30 tablet 0  ? oxyCODONE-acetaminophen (PERCOCET) 5-325 MG tablet Take 1 tablet by mouth every 6 hours as needed for severe pain. 60 tablet 0  ? sacubitril-valsartan (ENTRESTO) 24-26 MG Take 1 tablet by mouth 2 (two) times daily. 60 tablet 5  ? ?No current facility-administered medications for this visit.  ? ? ?VITAL SIGNS: ?There were no vitals taken for this visit. ?There were no vitals filed for this visit.  ?Estimated body mass index is 19.99 kg/m? as calculated from the following: ?  Height as of an earlier encounter on 01/28/22: $RemoveBef'5\' 8"'PpELkRXArD$  (1.727 m). ?  Weight as of an earlier encounter on 01/28/22: 131 lb 8 oz (59.6 kg). ? ?LABS: ?CBC: ?   ?Component Value Date/Time  ? WBC 27.4 (H) 01/28/2022 1333  ? WBC 6.4 05/19/2021 0938  ? HGB 12.4 (L) 01/28/2022 1333  ? HGB 8.6 (L) 03/25/2021 1641  ? HCT 36.8 (L) 01/28/2022 1333  ? HCT 27.2 (L) 03/25/2021 1641  ? PLT 475 (H) 01/28/2022 1333  ? PLT 454 (H) 03/25/2021 1641  ? MCV 79.8 (L) 01/28/2022 1333  ? MCV 77 (L) 03/25/2021 1641  ? NEUTROABS 25.2 (H) 01/28/2022 1333  ? NEUTROABS 6.8 03/25/2021 1641  ? LYMPHSABS 0.8 01/28/2022 1333  ? LYMPHSABS 1.7 03/25/2021 1641  ? MONOABS 1.1 (H) 01/28/2022 1333  ? EOSABS 0.0 01/28/2022 1333  ? EOSABS 0.7 (H) 03/25/2021 1641  ? BASOSABS 0.0 01/28/2022 1333  ? BASOSABS 0.0 03/25/2021 1641  ? ?Comprehensive Metabolic Panel: ?   ?Component Value Date/Time   ? NA 132 (L) 01/28/2022 1333  ? K 3.1 (L) 01/28/2022 1333  ? CL 96 (L) 01/28/2022 1333  ? CO2 27 01/28/2022 1333  ? BUN 14 01/28/2022 1333  ? CREATININE 0.79 01/28/2022 1333  ? GLUCOSE 135 (H) 01/28/2022 1333  ? CALCIUM 8.8 (L) 01/28/2022 1333  ? AST 11 (L) 01/28/2022 1333  ? ALT 8 01/28/2022 1333  ? ALKPHOS 118 01/28/2022 1333  ? BILITOT 0.7 01/28/2022 1333  ? PROT 6.1 (L) 01/28/2022 1333  ? ALBUMIN 3.1 (L) 01/28/2022 1333  ? ? ?RADIOGRAPHIC STUDIES: ?No results found. ? ?PERFORMANCE STATUS (ECOG) : 2 - Symptomatic, <50% confined to bed ? ?Review of Systems  ?Constitutional:  Positive for appetite change and fatigue.  ?Respiratory:  Positive for cough.   ?Musculoskeletal:  Positive for arthralgias and back pain.  ?Unless otherwise noted, a complete review of systems is negative. ? ?Physical Exam ?General: NAD, chronically-ill appearing, in wheelchair  ?Cardiovascular: regular rate and rhythm ?Pulmonary: diminished  ?Abdomen: soft, nontender, + bowel sounds ?Extremities: no edema, no joint deformities ?Skin: no rashes ?Neurological: Weakness, AAO x3, mood appropriate  ? ?IMPRESSION: ?This is my initial visit with Connor Solomon and his wife. No acute distress noted. He is in a wheelchair. Complains of some weakness, body aches, and fatigue.  ? ?I introduced myself, RN, and Palliative's role in collaboration with the oncology team. Concept of Palliative Care was introduced as specialized medical care for people and their families living with serious illness.  It focuses on providing relief from the symptoms and stress of a serious illness.  The goal is to improve quality of life for both the patient and the family. Values and goals of care important to patient and family were attempted to be elicited.  ? ?Connor Solomon shares he and his wife, Marlowe Kays have been married for over 25 years. They have several children and grandchildren amongst the two of them. He formerly worked with [employer]. He has not worked in  some years.  ? ?He is ambulatory with walker. Does not drive. Able to perform most ADLs independently although he endorses significant fatigue. States over the past 1-2 weeks he has not been able to c

## 2022-01-29 ENCOUNTER — Other Ambulatory Visit: Payer: Self-pay | Admitting: *Deleted

## 2022-01-29 ENCOUNTER — Telehealth: Payer: Self-pay

## 2022-01-29 ENCOUNTER — Other Ambulatory Visit (HOSPITAL_COMMUNITY): Payer: Self-pay

## 2022-01-29 ENCOUNTER — Telehealth: Payer: Self-pay | Admitting: Hematology and Oncology

## 2022-01-29 LAB — KAPPA/LAMBDA LIGHT CHAINS
Kappa free light chain: 26 mg/L — ABNORMAL HIGH (ref 3.3–19.4)
Kappa, lambda light chain ratio: 7.03 — ABNORMAL HIGH (ref 0.26–1.65)
Lambda free light chains: 3.7 mg/L — ABNORMAL LOW (ref 5.7–26.3)

## 2022-01-29 MED ORDER — IBRUTINIB 420 MG PO TABS
420.0000 mg | ORAL_TABLET | Freq: Every day | ORAL | 2 refills | Status: DC
Start: 2022-01-29 — End: 2022-04-14

## 2022-01-29 MED ORDER — METHADONE HCL 5 MG PO TABS
5.0000 mg | ORAL_TABLET | Freq: Two times a day (BID) | ORAL | 0 refills | Status: DC
  Filled 2022-01-29: qty 30, 15d supply, fill #0

## 2022-01-29 MED ORDER — OXYCODONE-ACETAMINOPHEN 5-325 MG PO TABS
1.0000 | ORAL_TABLET | Freq: Four times a day (QID) | ORAL | 0 refills | Status: DC | PRN
  Filled 2022-01-29: qty 60, 15d supply, fill #0

## 2022-01-29 NOTE — Addendum Note (Signed)
Addended by: Jimmy Footman on: 01/29/2022 09:08 AM ? ? Modules accepted: Orders ? ?

## 2022-01-29 NOTE — Telephone Encounter (Signed)
Transition Care Management Follow-up Telephone Call ?Date of discharge and from where: 01/28/2022 from [facility] ?How have you been since you were released from the hospital? Patient stated that he is feeling okay. Patient has started abx's today. No changes since discharge.  ?Any questions or concerns? No ? ?Items Reviewed: ?Did the pt receive and understand the discharge instructions provided? Yes  ?Medications obtained and verified? Yes  ?Other? No  ?Any new allergies since your discharge? No  ?Dietary orders reviewed? No ?Do you have support at home? Yes  ? ?Functional Questionnaire: (I = Independent and D = Dependent) ?ADLs: I ? ?Bathing/Dressing- I ? ?Meal Prep- I ? ?Eating- I ? ?Maintaining continence- I ? ?Transferring/Ambulation- I ? ?Managing Meds- I  ? ?Follow up appointments reviewed: ? ?PCP Hospital f/u appt confirmed? No   ?Specialist Hospital f/u appt confirmed? No   ?Are transportation arrangements needed? No  ?If their condition worsens, is the pt aware to call PCP or go to the Emergency Dept.? Yes ?Was the patient provided with contact information for the PCP's office or ED? Yes ?Was to pt encouraged to call back with questions or concerns? Yes ? ?

## 2022-01-29 NOTE — Telephone Encounter (Signed)
Scheduled per 4/20 los, pt has been called and confirmed ?

## 2022-02-01 ENCOUNTER — Ambulatory Visit: Payer: Medicaid Other | Admitting: Hematology and Oncology

## 2022-02-01 ENCOUNTER — Other Ambulatory Visit: Payer: Medicaid Other

## 2022-02-01 LAB — MULTIPLE MYELOMA PANEL, SERUM
Albumin SerPl Elph-Mcnc: 2.3 g/dL — ABNORMAL LOW (ref 2.9–4.4)
Albumin/Glob SerPl: 1 (ref 0.7–1.7)
Alpha 1: 0.6 g/dL — ABNORMAL HIGH (ref 0.0–0.4)
Alpha2 Glob SerPl Elph-Mcnc: 1 g/dL (ref 0.4–1.0)
B-Globulin SerPl Elph-Mcnc: 0.8 g/dL (ref 0.7–1.3)
Gamma Glob SerPl Elph-Mcnc: 0.2 g/dL — ABNORMAL LOW (ref 0.4–1.8)
Globulin, Total: 2.5 g/dL (ref 2.2–3.9)
IgA: 6 mg/dL — ABNORMAL LOW (ref 90–386)
IgG (Immunoglobin G), Serum: 131 mg/dL — ABNORMAL LOW (ref 603–1613)
IgM (Immunoglobulin M), Srm: 102 mg/dL (ref 20–172)
M Protein SerPl Elph-Mcnc: 0.1 g/dL — ABNORMAL HIGH
Total Protein ELP: 4.8 g/dL — ABNORMAL LOW (ref 6.0–8.5)

## 2022-02-02 LAB — CULTURE, BLOOD (ROUTINE X 2)
Culture: NO GROWTH
Culture: NO GROWTH
Special Requests: ADEQUATE
Special Requests: ADEQUATE

## 2022-02-08 ENCOUNTER — Other Ambulatory Visit: Payer: Self-pay

## 2022-02-08 ENCOUNTER — Other Ambulatory Visit (HOSPITAL_COMMUNITY): Payer: Self-pay

## 2022-02-08 DIAGNOSIS — Z419 Encounter for procedure for purposes other than remedying health state, unspecified: Secondary | ICD-10-CM | POA: Diagnosis not present

## 2022-02-09 ENCOUNTER — Other Ambulatory Visit (HOSPITAL_COMMUNITY): Payer: Self-pay

## 2022-02-09 ENCOUNTER — Encounter: Payer: Self-pay | Admitting: Physician Assistant

## 2022-02-09 ENCOUNTER — Other Ambulatory Visit: Payer: Self-pay | Admitting: Nurse Practitioner

## 2022-02-09 DIAGNOSIS — N62 Hypertrophy of breast: Secondary | ICD-10-CM

## 2022-02-09 MED ORDER — METHADONE HCL 5 MG PO TABS
5.0000 mg | ORAL_TABLET | Freq: Two times a day (BID) | ORAL | 0 refills | Status: DC
  Filled 2022-02-11: qty 30, 15d supply, fill #0

## 2022-02-09 MED ORDER — OXYCODONE-ACETAMINOPHEN 5-325 MG PO TABS
1.0000 | ORAL_TABLET | Freq: Four times a day (QID) | ORAL | 0 refills | Status: DC | PRN
  Filled 2022-02-11: qty 60, 15d supply, fill #0

## 2022-02-11 ENCOUNTER — Other Ambulatory Visit (HOSPITAL_COMMUNITY): Payer: Self-pay

## 2022-02-11 ENCOUNTER — Inpatient Hospital Stay: Payer: Medicaid Other | Attending: Hematology and Oncology

## 2022-02-11 ENCOUNTER — Other Ambulatory Visit: Payer: Self-pay

## 2022-02-11 ENCOUNTER — Inpatient Hospital Stay (HOSPITAL_BASED_OUTPATIENT_CLINIC_OR_DEPARTMENT_OTHER): Payer: Medicaid Other | Admitting: Hematology and Oncology

## 2022-02-11 VITALS — BP 101/63 | HR 61 | Temp 97.2°F | Resp 16 | Wt 143.3 lb

## 2022-02-11 DIAGNOSIS — R110 Nausea: Secondary | ICD-10-CM | POA: Diagnosis not present

## 2022-02-11 DIAGNOSIS — R059 Cough, unspecified: Secondary | ICD-10-CM | POA: Insufficient documentation

## 2022-02-11 DIAGNOSIS — Z5986 Financial insecurity: Secondary | ICD-10-CM | POA: Insufficient documentation

## 2022-02-11 DIAGNOSIS — D638 Anemia in other chronic diseases classified elsewhere: Secondary | ICD-10-CM | POA: Insufficient documentation

## 2022-02-11 DIAGNOSIS — I110 Hypertensive heart disease with heart failure: Secondary | ICD-10-CM | POA: Diagnosis not present

## 2022-02-11 DIAGNOSIS — Z596 Low income: Secondary | ICD-10-CM | POA: Insufficient documentation

## 2022-02-11 DIAGNOSIS — C880 Waldenstrom macroglobulinemia: Secondary | ICD-10-CM | POA: Diagnosis not present

## 2022-02-11 DIAGNOSIS — R112 Nausea with vomiting, unspecified: Secondary | ICD-10-CM | POA: Insufficient documentation

## 2022-02-11 DIAGNOSIS — R635 Abnormal weight gain: Secondary | ICD-10-CM | POA: Diagnosis not present

## 2022-02-11 DIAGNOSIS — R590 Localized enlarged lymph nodes: Secondary | ICD-10-CM | POA: Insufficient documentation

## 2022-02-11 DIAGNOSIS — Z79899 Other long term (current) drug therapy: Secondary | ICD-10-CM | POA: Diagnosis not present

## 2022-02-11 DIAGNOSIS — Z8249 Family history of ischemic heart disease and other diseases of the circulatory system: Secondary | ICD-10-CM | POA: Insufficient documentation

## 2022-02-11 DIAGNOSIS — Z8349 Family history of other endocrine, nutritional and metabolic diseases: Secondary | ICD-10-CM | POA: Insufficient documentation

## 2022-02-11 DIAGNOSIS — D472 Monoclonal gammopathy: Secondary | ICD-10-CM | POA: Diagnosis not present

## 2022-02-11 DIAGNOSIS — Z87891 Personal history of nicotine dependence: Secondary | ICD-10-CM | POA: Insufficient documentation

## 2022-02-11 DIAGNOSIS — Z5941 Food insecurity: Secondary | ICD-10-CM | POA: Diagnosis not present

## 2022-02-11 DIAGNOSIS — Z885 Allergy status to narcotic agent status: Secondary | ICD-10-CM | POA: Insufficient documentation

## 2022-02-11 DIAGNOSIS — Z833 Family history of diabetes mellitus: Secondary | ICD-10-CM | POA: Diagnosis not present

## 2022-02-11 DIAGNOSIS — R5383 Other fatigue: Secondary | ICD-10-CM | POA: Diagnosis not present

## 2022-02-11 DIAGNOSIS — I5022 Chronic systolic (congestive) heart failure: Secondary | ICD-10-CM | POA: Diagnosis not present

## 2022-02-11 DIAGNOSIS — R630 Anorexia: Secondary | ICD-10-CM | POA: Diagnosis not present

## 2022-02-11 LAB — CBC WITH DIFFERENTIAL (CANCER CENTER ONLY)
Abs Immature Granulocytes: 0.03 10*3/uL (ref 0.00–0.07)
Basophils Absolute: 0.1 10*3/uL (ref 0.0–0.1)
Basophils Relative: 1 %
Eosinophils Absolute: 0.5 10*3/uL (ref 0.0–0.5)
Eosinophils Relative: 7 %
HCT: 31.8 % — ABNORMAL LOW (ref 39.0–52.0)
Hemoglobin: 10.1 g/dL — ABNORMAL LOW (ref 13.0–17.0)
Immature Granulocytes: 0 %
Lymphocytes Relative: 22 %
Lymphs Abs: 1.7 10*3/uL (ref 0.7–4.0)
MCH: 26.9 pg (ref 26.0–34.0)
MCHC: 31.8 g/dL (ref 30.0–36.0)
MCV: 84.6 fL (ref 80.0–100.0)
Monocytes Absolute: 0.7 10*3/uL (ref 0.1–1.0)
Monocytes Relative: 9 %
Neutro Abs: 4.7 10*3/uL (ref 1.7–7.7)
Neutrophils Relative %: 61 %
Platelet Count: 506 10*3/uL — ABNORMAL HIGH (ref 150–400)
RBC: 3.76 MIL/uL — ABNORMAL LOW (ref 4.22–5.81)
RDW: 15.7 % — ABNORMAL HIGH (ref 11.5–15.5)
WBC Count: 7.7 10*3/uL (ref 4.0–10.5)
nRBC: 0 % (ref 0.0–0.2)

## 2022-02-11 LAB — CMP (CANCER CENTER ONLY)
ALT: 7 U/L (ref 0–44)
AST: 13 U/L — ABNORMAL LOW (ref 15–41)
Albumin: 3.2 g/dL — ABNORMAL LOW (ref 3.5–5.0)
Alkaline Phosphatase: 97 U/L (ref 38–126)
Anion gap: 5 (ref 5–15)
BUN: 12 mg/dL (ref 6–20)
CO2: 25 mmol/L (ref 22–32)
Calcium: 8.2 mg/dL — ABNORMAL LOW (ref 8.9–10.3)
Chloride: 105 mmol/L (ref 98–111)
Creatinine: 0.93 mg/dL (ref 0.61–1.24)
GFR, Estimated: >60 mL/min (ref >60)
Glucose, Bld: 104 mg/dL — ABNORMAL HIGH (ref 70–99)
Potassium: 3.9 mmol/L (ref 3.5–5.1)
Sodium: 135 mmol/L (ref 135–145)
Total Bilirubin: 0.3 mg/dL (ref 0.3–1.2)
Total Protein: 5.2 g/dL — ABNORMAL LOW (ref 6.5–8.1)

## 2022-02-11 LAB — LACTATE DEHYDROGENASE: LDH: 113 U/L (ref 98–192)

## 2022-02-11 MED ORDER — FERROUS SULFATE 325 (65 FE) MG PO TABS
325.0000 mg | ORAL_TABLET | Freq: Every day | ORAL | 3 refills | Status: DC
Start: 2022-02-11 — End: 2023-03-13
  Filled 2022-02-11: qty 30, 30d supply, fill #0
  Filled 2022-03-20: qty 30, 30d supply, fill #1
  Filled 2022-04-24 – 2022-05-31 (×5): qty 30, 30d supply, fill #2
  Filled 2022-06-12: qty 100, 100d supply, fill #2
  Filled 2022-09-26 – 2022-10-07 (×3): qty 100, 100d supply, fill #3

## 2022-02-11 NOTE — Progress Notes (Signed)
?[place] ?[phone]   [phone] ? ?PROGRESS NOTE ? ?Patient Care Team: ?Delene Ruffini, MD as PCP - General (Internal Medicine) ?Pickenpack-Cousar, Carlena Sax, NP as Nurse Practitioner (Nurse Practitioner) ? ?Hematological/Oncological History ?# Waldenstrom's Macroglobulinemia ?# IgM Monoclonal Gammopathy ?04/07/2021: CT A/p showed mildly enlarged retroperitoneal lymph nodes and bilateral inguinal lymph nodes.  ?04/08/2021: IgM Kappa M protein 1.2 ?04/09/2021: WBC 11.1, Hgb 9.5, MCV 75.4, Plt 525 ?04/20/2021: Kappa 977, Lambda 7.8, K/L ratio 125.28. Serum viscosity 1.8 ?05/06/2021: Establish care with Dr. 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 Infusion reaction with ritux, held halfway through.  ?06/12/2021: Cycle 2 Day 1 of Ibrutinib + Rituximab ?06/19/2021: Cycle 3 Day 1 of Ibrutinib + Rituximab ?06/26/2021: Cycle 4 Day 1 of rituximab. Continued on daily ibrutinib ?12/02/2021: Cycle 5 Day 1 of Ibrutinib + Rituximab ?12/09/2021: Cycle 6 Day 1 of Ibrutinib + Rituximab ?12/16/2021: Cycle 7 Day 1 of Ibrutinib + Rituximab ?12/23/2021: Held Cycle 8 of Ritxumab due to nausea/vomiting, elevated creatinine, finger infection ? ?Interval History:  ?Connor Solomon 61 y.o. male with medical history significant for Waldenstrom's macroglobulinemia who presents for a follow up visit. The patient's last visit was on 01/28/2022 In the interim since the last visit was started on antibiotic therapy for presumed pneumonia.. ? ?On exam today Connor Solomon reports he has had a marked improvement in his breathing, respirations, and energy after receiving antibiotic therapy.  He notes that he is gaining weight back and is feeling much better.  He completed his course of antibiotics as prescribed.  He notes that methadone  is working well for his pain though he has not noticed much of a difference with the medication on board.  He notes that he has not been having any issues with nausea, vomiting, or diarrhea.  He has been having some cough with occasional streaks of blood in the sputum.  Otherwise he denies fevers, chills, night sweats, chest pain, headache or vision changes. Full 10 point ROS is listed below. ? ? ?MEDICAL HISTORY:  ?Past Medical History:  ?Diagnosis Date  ? Acute heart failure ([place]) 04/08/2021  ? Acute HFrEF (heart failure with reduced ejection fraction) ([place]) 04/07/2021  ? Arthritis   ? hands, elbows bilaterally  ? Cancer [facility])   ? Full dentures   ? Hypertension   ? Pt states he doen not have HTN and has never been treated for HTN  ? Kidney stone on left side last stone 4-5 yrs ago  ? recurrent (3 episodes)  ? Panic disorder   ? pt states has resolved  ? Syncope   ? resolved- was related to panic attacks  ? ? ?SURGICAL HISTORY: ?Past Surgical History:  ?Procedure Laterality Date  ? CARPAL METACARPAL FUSION WITH DISTAL RADIAL BONE GRAFT Left 05/30/2013  ? Procedure: LEFT THUMB METACARPAL JOINT FUSION;  Surgeon: Schuyler Amor, MD;  Location: [place];  Service: Orthopedics;  Laterality: Left;  ? ESOPHAGOGASTRODUODENOSCOPY N/A 02/12/2014  ? Procedure: ESOPHAGOGASTRODUODENOSCOPY (EGD);  Surgeon: Jerene Bears, MD;  Location: [facility] ENDOSCOPY;  Service: Endoscopy;  Laterality: N/A;  ? FOREIGN BODY REMOVAL N/A 02/12/2014  ? Procedure: FOREIGN BODY REMOVAL;  Surgeon: Jerene Bears, MD;  Location: [facility] ENDOSCOPY;  Service: Endoscopy;  Laterality: N/A;  ? OPEN REDUCTION INTERNAL FIXATION (ORIF) DISTAL RADIAL FRACTURE Left 11/29/2012  ? Procedure: OPEN REDUCTION INTERNAL  FIXATION (ORIF) DISTAL RADIAL FRACTURE;  Surgeon: Schuyler Amor, MD;  Location: [place];  Service: Orthopedics;  Laterality: Left;  LEFT DISTAL RADIUS OSTEOTOMY WITH BONE GRAFT  ? RIGHT/LEFT HEART CATH AND CORONARY ANGIOGRAPHY  N/A 04/09/2021  ? Procedure: RIGHT/LEFT HEART CATH AND CORONARY ANGIOGRAPHY;  Surgeon: Jolaine Artist, MD;  Location: [place] CV LAB;  Service: Cardiovascular;  Laterality: N/A;  ? TENDON REPAIR Left 02/07/2013  ? Procedure: LEFT EXTENSOR INDICIS PROPRIUS  TO EXTENSOR POLLICIS LONGUS TENDON TRANSFER;  Surgeon: Schuyler Amor, MD;  Location: [place];  Service: Orthopedics;  Laterality: Left;  ? WISDOM TOOTH EXTRACTION    ? WRIST SURGERY    ? fx only  ? ? ?SOCIAL HISTORY: ?Social History  ? ?Socioeconomic History  ? Marital status: Single  ?  Spouse name: Not on file  ? Number of children: Not on file  ? Years of education: Not on file  ? Highest education level: Not on file  ?Occupational History  ? Not on file  ?Tobacco Use  ? Smoking status: Former  ?  Types: Cigars  ?  Quit date: 10/11/2020  ?  Years since quitting: 1.3  ? Smokeless tobacco: Never  ? Tobacco comments:  ?  smokes 1 cigar a week  ?Vaping Use  ? Vaping Use: Never used  ?Substance and Sexual Activity  ? Alcohol use: No  ? Drug use: No  ? Sexual activity: Not on file  ?Other Topics Concern  ? Not on file  ?Social History Narrative  ? Not on file  ? ?Social Determinants of Health  ? ?Financial Resource Strain: High Risk  ? Difficulty of Paying Living Expenses: Very hard  ?Food Insecurity: Food Insecurity Present  ? Worried About Charity fundraiser in the Last Year: Sometimes true  ? Ran Out of Food in the Last Year: Sometimes true  ?Transportation Needs: No Transportation Needs  ? Lack of Transportation (Medical): No  ? Lack of Transportation (Non-Medical): No  ?Physical Activity: Not on file  ?Stress: Stress Concern Present  ? Feeling of Stress : Very much  ?Social Connections: Not on file  ?Intimate Partner Violence: Not on file  ? ? ?FAMILY HISTORY: ?Family History  ?Problem Relation Age of Onset  ? Heart attack Father   ? Sudden Cardiac Death Father 29  ? Diabetes Neg Hx   ? Hyperlipidemia Neg Hx   ? Hypertension Neg Hx    ? ? ?ALLERGIES:  is allergic to rituxan [rituximab] and hydrocodone. ? ?MEDICATIONS:  ?Current Outpatient Medications  ?Medication Sig Dispense Refill  ? ferrous sulfate 325 (65 FE) MG tablet Take 1 tablet by mouth daily with breakfast. Please take with a source of Vitamin C 90 tablet 3  ? acetaminophen (TYLENOL) 500 MG tablet Take 500 mg by mouth every 6 (six) hours as needed.    ? albuterol (VENTOLIN HFA) 108 (90 Base) MCG/ACT inhaler Inhale 2 puffs into the lungs every 6 hours as needed for wheezing or shortness of breath 18 g 0  ? allopurinol (ZYLOPRIM) 300 MG tablet TAKE 1 TABLET BY MOUTH EVERY DAY 90 tablet 1  ? carvedilol (COREG) 3.125 MG tablet Take 1 tablet by mouth 2 times daily. 68 tablet 2  ? empagliflozin (JARDIANCE) 10 MG TABS tablet Take 1 tablet by mouth daily before breakfast. 30 tablet 11  ? eplerenone (INSPRA) 25 MG tablet Take 1/2 tablet (12.5 mg total) by mouth daily. 15 tablet 11  ? furosemide (  LASIX) 20 MG tablet Take 1 tablet (20 mg total) by mouth daily as needed for swelling 100 tablet 2  ? ibrutinib (IMBRUVICA) 420 MG tablet Take 1 tablet (420 mg total) by mouth daily. 30 tablet 2  ? methadone (DOLOPHINE) 5 MG tablet Take 1 tablet by mouth every 12 hours. 30 tablet 0  ? metoCLOPramide (REGLAN) 10 MG tablet Take 1 tablet (10 mg total) by mouth every 8 (eight) hours as needed for nausea. 30 tablet 1  ? naloxone (NARCAN) nasal spray 4 mg/0.1 mL Take for overdose 1 each 0  ? naloxone (NARCAN) nasal spray 4 mg/0.1 mL Take for overdose as directed 2 each 0  ? OLANZapine (ZYPREXA) 5 MG tablet Take 1 tablet (5 mg total) by mouth at bedtime. 30 tablet 3  ? ondansetron (ZOFRAN) 8 MG tablet Take 1 tablet by mouth every 8 hours as needed. 30 tablet 0  ? oxyCODONE-acetaminophen (PERCOCET) 5-325 MG tablet Take 1 tablet by mouth every 6 hours as needed for severe pain. 60 tablet 0  ? potassium chloride SA (KLOR-CON M) 20 MEQ tablet Take 1 tablet (20 mEq total) by mouth daily. 10 tablet 0  ?  sacubitril-valsartan (ENTRESTO) 24-26 MG Take 1 tablet by mouth 2 (two) times daily. 60 tablet 5  ? ?No current facility-administered medications for this visit.  ? ? ?REVIEW OF SYSTEMS:   ?Constitutional: ( - ) f

## 2022-02-12 ENCOUNTER — Telehealth: Payer: Self-pay | Admitting: Physician Assistant

## 2022-02-12 NOTE — Telephone Encounter (Signed)
.  Called patient to schedule appointment per 5/5 staff msg, patient s.o is aware of date and time.   ?

## 2022-02-13 ENCOUNTER — Other Ambulatory Visit: Payer: Self-pay | Admitting: Internal Medicine

## 2022-02-14 ENCOUNTER — Other Ambulatory Visit: Payer: Self-pay | Admitting: Hematology and Oncology

## 2022-02-15 ENCOUNTER — Other Ambulatory Visit (HOSPITAL_COMMUNITY): Payer: Self-pay

## 2022-02-16 ENCOUNTER — Inpatient Hospital Stay: Payer: Medicaid Other | Admitting: Nurse Practitioner

## 2022-02-16 ENCOUNTER — Telehealth: Payer: Self-pay | Admitting: Nurse Practitioner

## 2022-02-16 ENCOUNTER — Telehealth: Payer: Self-pay

## 2022-02-16 NOTE — Telephone Encounter (Signed)
Notified Patient of prior authorization approval for Olanzapine $RemoveBefore'5mg'[id number]$  tablets. Medication is approved through 02/16/2023. No other needs or concerns voiced at this time. ?

## 2022-02-16 NOTE — Telephone Encounter (Signed)
.  Called patient to schedule appointment per 5.9 inbasket, patient is aware of date and time.   ?

## 2022-02-22 ENCOUNTER — Other Ambulatory Visit (HOSPITAL_COMMUNITY): Payer: Self-pay

## 2022-02-23 ENCOUNTER — Encounter: Payer: Self-pay | Admitting: Nurse Practitioner

## 2022-02-23 ENCOUNTER — Other Ambulatory Visit: Payer: Self-pay

## 2022-02-23 ENCOUNTER — Other Ambulatory Visit (HOSPITAL_COMMUNITY): Payer: Self-pay

## 2022-02-23 ENCOUNTER — Other Ambulatory Visit (HOSPITAL_COMMUNITY): Payer: Self-pay | Admitting: Family Medicine

## 2022-02-23 ENCOUNTER — Inpatient Hospital Stay (HOSPITAL_BASED_OUTPATIENT_CLINIC_OR_DEPARTMENT_OTHER): Payer: Medicaid Other | Admitting: Nurse Practitioner

## 2022-02-23 VITALS — BP 150/80 | HR 60 | Temp 97.1°F | Resp 15 | Wt 139.5 lb

## 2022-02-23 DIAGNOSIS — R059 Cough, unspecified: Secondary | ICD-10-CM | POA: Diagnosis not present

## 2022-02-23 DIAGNOSIS — Z5986 Financial insecurity: Secondary | ICD-10-CM | POA: Diagnosis not present

## 2022-02-23 DIAGNOSIS — C880 Waldenstrom macroglobulinemia: Secondary | ICD-10-CM

## 2022-02-23 DIAGNOSIS — Z515 Encounter for palliative care: Secondary | ICD-10-CM

## 2022-02-23 DIAGNOSIS — D638 Anemia in other chronic diseases classified elsewhere: Secondary | ICD-10-CM | POA: Diagnosis not present

## 2022-02-23 DIAGNOSIS — Z7189 Other specified counseling: Secondary | ICD-10-CM | POA: Diagnosis not present

## 2022-02-23 DIAGNOSIS — G4700 Insomnia, unspecified: Secondary | ICD-10-CM

## 2022-02-23 DIAGNOSIS — R630 Anorexia: Secondary | ICD-10-CM | POA: Diagnosis not present

## 2022-02-23 DIAGNOSIS — R112 Nausea with vomiting, unspecified: Secondary | ICD-10-CM | POA: Diagnosis not present

## 2022-02-23 DIAGNOSIS — R635 Abnormal weight gain: Secondary | ICD-10-CM | POA: Diagnosis not present

## 2022-02-23 DIAGNOSIS — G893 Neoplasm related pain (acute) (chronic): Secondary | ICD-10-CM | POA: Diagnosis not present

## 2022-02-23 DIAGNOSIS — R590 Localized enlarged lymph nodes: Secondary | ICD-10-CM | POA: Diagnosis not present

## 2022-02-23 DIAGNOSIS — K5903 Drug induced constipation: Secondary | ICD-10-CM

## 2022-02-23 DIAGNOSIS — I5022 Chronic systolic (congestive) heart failure: Secondary | ICD-10-CM | POA: Diagnosis not present

## 2022-02-23 DIAGNOSIS — R634 Abnormal weight loss: Secondary | ICD-10-CM | POA: Diagnosis not present

## 2022-02-23 DIAGNOSIS — R110 Nausea: Secondary | ICD-10-CM | POA: Diagnosis not present

## 2022-02-23 DIAGNOSIS — I110 Hypertensive heart disease with heart failure: Secondary | ICD-10-CM | POA: Diagnosis not present

## 2022-02-23 DIAGNOSIS — R5383 Other fatigue: Secondary | ICD-10-CM | POA: Diagnosis not present

## 2022-02-23 DIAGNOSIS — D472 Monoclonal gammopathy: Secondary | ICD-10-CM | POA: Diagnosis not present

## 2022-02-23 MED ORDER — METHADONE HCL 5 MG PO TABS
5.0000 mg | ORAL_TABLET | Freq: Two times a day (BID) | ORAL | 0 refills | Status: DC
  Filled 2022-02-26: qty 30, 15d supply, fill #0

## 2022-02-23 MED ORDER — OXYCODONE HCL 5 MG PO TABS
5.0000 mg | ORAL_TABLET | Freq: Four times a day (QID) | ORAL | 0 refills | Status: DC | PRN
  Filled 2022-02-23: qty 60, 15d supply, fill #0

## 2022-02-23 NOTE — Progress Notes (Signed)
? ?  ?Palliative Medicine ?[place]  ?[phone]) [phone] [phone] ? ? ?Name: Connor Solomon ?Date: 02/23/2022 ?MRN: [id number]  ?DOB: Feb 20, 1961 ? ?Patient Care Team: ?Delene Ruffini, MD as PCP - General (Internal Medicine) ?Pickenpack-Cousar, Carlena Sax, NP as Nurse Practitioner (Nurse Practitioner)  ? ? ?REASON FOR CONSULTATION: ?Connor Solomon is a 61 y.o. 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 Palliative ask to see for symptom management. ? ?SOCIAL HISTORY:    ? reports that he quit smoking about 16 months ago. His smoking use included cigars. He has never used smokeless tobacco. He reports that he does not drink alcohol and does not use drugs. ? ?ADVANCE DIRECTIVES:  ? ? ?CODE STATUS:  ? ?PAST MEDICAL HISTORY: ?Past Medical History:  ?Diagnosis Date  ? Acute heart failure ([place]) 04/08/2021  ? Acute HFrEF (heart failure with reduced ejection fraction) ([place]) 04/07/2021  ? Arthritis   ? hands, elbows bilaterally  ? Cancer [facility])   ? Full dentures   ? Hypertension   ? Pt states he doen not have HTN and has never been treated for HTN  ? Kidney stone on left side last stone 4-5 yrs ago  ? recurrent (3 episodes)  ? Panic disorder   ? pt states has resolved  ? Syncope   ? resolved- was related to panic attacks  ? ? ?PAST SURGICAL HISTORY:  ?Past Surgical History:  ?Procedure Laterality Date  ? CARPAL METACARPAL FUSION WITH DISTAL RADIAL BONE GRAFT Left 05/30/2013  ? Procedure: LEFT THUMB METACARPAL JOINT FUSION;  Surgeon: Schuyler Amor, MD;  Location: [place];  Service: Orthopedics;  Laterality: Left;  ? ESOPHAGOGASTRODUODENOSCOPY N/A 02/12/2014  ? Procedure: ESOPHAGOGASTRODUODENOSCOPY (EGD);  Surgeon: Jerene Bears, MD;  Location: [facility] ENDOSCOPY;  Service: Endoscopy;  Laterality: N/A;  ? FOREIGN BODY REMOVAL N/A 02/12/2014  ? Procedure: FOREIGN BODY REMOVAL;  Surgeon: Jerene Bears, MD;  Location: [facility] ENDOSCOPY;  Service: Endoscopy;  Laterality: N/A;  ? OPEN REDUCTION INTERNAL FIXATION (ORIF) DISTAL RADIAL FRACTURE Left 11/29/2012  ? Procedure: OPEN REDUCTION INTERNAL FIXATION (ORIF) DISTAL RADIAL FRACTURE;  Surgeon: Schuyler Amor, MD;  Location: [place];  Service: Orthopedics;  Laterality: Left;  LEFT DISTAL RADIUS OSTEOTOMY WITH BONE GRAFT  ? RIGHT/LEFT HEART CATH AND CORONARY ANGIOGRAPHY N/A 04/09/2021  ? Procedure: RIGHT/LEFT HEART CATH AND CORONARY ANGIOGRAPHY;  Surgeon: Jolaine Artist, MD;  Location: [place] CV LAB;  Service: Cardiovascular;  Laterality: N/A;  ? TENDON REPAIR Left 02/07/2013  ? Procedure: LEFT EXTENSOR INDICIS PROPRIUS  TO EXTENSOR POLLICIS LONGUS TENDON TRANSFER;  Surgeon: Schuyler Amor, MD;  Location: [place];  Service: Orthopedics;  Laterality: Left;  ? WISDOM TOOTH EXTRACTION    ? WRIST SURGERY    ? fx only  ? ? ?HEMATOLOGY/ONCOLOGY HISTORY:  ?Oncology History  ?Waldenstrom macroglobulinemia ([place])  ?05/24/2021 Initial Diagnosis  ? Waldenstrom macroglobulinemia ([place]) ?  ?06/05/2021 -  Chemotherapy  ? Patient is on Treatment Plan : NON-HODGKINS LYMPHOMA Rituximab q7d  ?   ? ? ?ALLERGIES:  is allergic to rituxan [rituximab] and hydrocodone. ? ?MEDICATIONS:  ?Current Outpatient Medications  ?Medication Sig Dispense Refill  ? oxyCODONE (OXY IR/ROXICODONE) 5 MG immediate release tablet Take 1 tablet (5 mg total) by mouth every 6 (six) hours as needed for severe pain or breakthrough pain. 60 tablet 0  ? acetaminophen (TYLENOL) 500 MG tablet Take 500  mg by mouth every 6 (six) hours as needed.    ? albuterol (VENTOLIN HFA) 108 (90 Base) MCG/ACT inhaler Inhale 2 puffs into the lungs every 6 hours as needed for wheezing or shortness of breath 18 g 0  ? allopurinol (ZYLOPRIM) 300 MG tablet TAKE 1 TABLET BY MOUTH EVERY DAY 90 tablet 1  ? carvedilol (COREG) 3.125 MG tablet Take 1 tablet by mouth 2 times daily. 68 tablet 2  ?  empagliflozin (JARDIANCE) 10 MG TABS tablet Take 1 tablet by mouth daily before breakfast. 30 tablet 11  ? eplerenone (INSPRA) 25 MG tablet Take 1/2 tablet (12.5 mg total) by mouth daily. 15 tablet 11  ? ferrous sulfate 325 (65 FE) MG tablet Take 1 tablet by mouth daily with breakfast. Please take with a source of Vitamin C 90 tablet 3  ? furosemide (LASIX) 20 MG tablet Take 1 tablet (20 mg total) by mouth daily as needed for swelling 100 tablet 2  ? ibrutinib (IMBRUVICA) 420 MG tablet Take 1 tablet (420 mg total) by mouth daily. 30 tablet 2  ? [START ON 02/24/2022] methadone (DOLOPHINE) 5 MG tablet Take 1 tablet by mouth every 12 hours. 30 tablet 0  ? metoCLOPramide (REGLAN) 10 MG tablet Take 1 tablet (10 mg total) by mouth every 8 (eight) hours as needed for nausea. 30 tablet 1  ? naloxone (NARCAN) nasal spray 4 mg/0.1 mL Take for overdose 1 each 0  ? naloxone (NARCAN) nasal spray 4 mg/0.1 mL Take for overdose as directed 2 each 0  ? OLANZapine (ZYPREXA) 5 MG tablet TAKE 1 TABLET BY MOUTH EVERYDAY AT BEDTIME 90 tablet 2  ? ondansetron (ZOFRAN) 8 MG tablet Take 1 tablet by mouth every 8 hours as needed. 30 tablet 0  ? potassium chloride SA (KLOR-CON M) 20 MEQ tablet Take 1 tablet (20 mEq total) by mouth daily. 10 tablet 0  ? sacubitril-valsartan (ENTRESTO) 24-26 MG Take 1 tablet by mouth 2 (two) times daily. 60 tablet 5  ? ?No current facility-administered medications for this visit.  ? ? ?VITAL SIGNS: ?BP (!) 150/80 Comment: pt reports feling "fine"  Pulse 60   Temp (!) 97.1 ?F (36.2 ?C) (Tympanic)   Resp 15   Wt 139 lb 8 oz (63.3 kg)   SpO2 98%   BMI 21.21 kg/m?  ?Filed Weights  ? 02/23/22 1359  ?Weight: 139 lb 8 oz (63.3 kg)  ?  ?Estimated body mass index is 21.21 kg/m? as calculated from the following: ?  Height as of 01/28/22: $RemoveBef'5\' 8"'[id number]$  (1.727 m). ?  Weight as of this encounter: 139 lb 8 oz (63.3 kg). ? ? ?PERFORMANCE STATUS (ECOG) : 2 - Symptomatic, <50% confined to bed ? ? ?Physical Exam ?General: NAD,  ambulatory without device ?Cardiovascular: RRR ?Pulmonary: normal breathing pattern  ?Abdomen: soft, nontender, + bowel sounds ?Extremities: no edema, no joint deformities ?Skin: no rashes ?Neurological: AAO x3, mood appropriate  ? ?IMPRESSION: ? ?Mr. Landa presents to the clinic for symptom management follow-up. No acute distress noted. Ambulatory without assistive devices. Shares that today is a good day for him. Appetite improving in addition to activity level. He is getting home affairs in order in regards to life insurance.  ? ?Neoplasm related pain ?Mr. Dulany states his pain has somewhat improved. Some days are better than others. Says he notices some differences since starting methadone. Pain increases with activity and fatigue. He is tolerating the methadone and continues to use oxycodone for breakthrough. Oriel states he has  not had to use the oxycodone today which he is appreciative of.  ? ?Will plan to remain on current regimen with close follow-up. Education provided on adjusting medications as needed (specifically methadone) to gain better relief as needed. He verbalized understanding and appreciation.  ? ?Constipation ?Reports daily or every other day bowel movements. Education provided on opioid induced constipation. Recommended Miralax daily. If no bowel movement in 48 hours he knows to increase to twice daily.  ? ?Appetite/decreased appetite.  ?Appetite is improving. He was started on olanzapine. Tolerating well. Weight has increased. Current weight 139lbs up from 131lbs 4/20, down from 147.8lbs on 3/22.  ? ?Discussed eating small frequent meals versus focusing on 3 large meals per day. Will continue to closely monitor.  ? ?4. Goals of care ?Mr. Xie shares his realistic understanding of his condition. He and his wife are making sure all "affairs" are in order as his biggest worry is leaving his wife in a financial constraint. Reports communicating with his life insurance policy etc. Emotional  support provided.  ? ?He is emotional expressing racing thoughts during idol time or interruptions in his sleep as his mind wonders thinking about worst case scenario. Is requesting something to assist with sleeping an

## 2022-02-24 ENCOUNTER — Other Ambulatory Visit (HOSPITAL_COMMUNITY): Payer: Self-pay

## 2022-02-24 MED ORDER — CARVEDILOL 3.125 MG PO TABS
3.1250 mg | ORAL_TABLET | Freq: Two times a day (BID) | ORAL | 2 refills | Status: DC
Start: 2022-02-24 — End: 2022-06-12
  Filled 2022-02-24: qty 68, 34d supply, fill #0
  Filled 2022-03-30: qty 68, 34d supply, fill #1
  Filled 2022-05-11: qty 68, 34d supply, fill #2

## 2022-02-27 ENCOUNTER — Other Ambulatory Visit (HOSPITAL_COMMUNITY): Payer: Self-pay

## 2022-03-01 ENCOUNTER — Other Ambulatory Visit (HOSPITAL_COMMUNITY): Payer: Self-pay

## 2022-03-01 ENCOUNTER — Other Ambulatory Visit: Payer: Self-pay | Admitting: Internal Medicine

## 2022-03-09 ENCOUNTER — Other Ambulatory Visit (HOSPITAL_COMMUNITY): Payer: Self-pay

## 2022-03-09 ENCOUNTER — Other Ambulatory Visit: Payer: Self-pay

## 2022-03-09 ENCOUNTER — Other Ambulatory Visit: Payer: Self-pay | Admitting: Physician Assistant

## 2022-03-09 MED ORDER — ALBUTEROL SULFATE HFA 108 (90 BASE) MCG/ACT IN AERS
INHALATION_SPRAY | RESPIRATORY_TRACT | 0 refills | Status: DC
  Filled 2022-03-09: qty 18, 25d supply, fill #0

## 2022-03-09 NOTE — Telephone Encounter (Signed)
Please arrange routine follow up with PCP as scheduling allows.

## 2022-03-10 ENCOUNTER — Other Ambulatory Visit: Payer: Self-pay

## 2022-03-10 ENCOUNTER — Encounter: Payer: Self-pay | Admitting: Hematology and Oncology

## 2022-03-10 ENCOUNTER — Inpatient Hospital Stay (HOSPITAL_BASED_OUTPATIENT_CLINIC_OR_DEPARTMENT_OTHER): Payer: Medicaid Other | Admitting: Physician Assistant

## 2022-03-10 ENCOUNTER — Inpatient Hospital Stay (HOSPITAL_BASED_OUTPATIENT_CLINIC_OR_DEPARTMENT_OTHER): Payer: Medicaid Other | Admitting: Nurse Practitioner

## 2022-03-10 ENCOUNTER — Encounter: Payer: Self-pay | Admitting: Nurse Practitioner

## 2022-03-10 ENCOUNTER — Other Ambulatory Visit (HOSPITAL_COMMUNITY): Payer: Self-pay

## 2022-03-10 ENCOUNTER — Inpatient Hospital Stay: Payer: Medicaid Other

## 2022-03-10 VITALS — BP 108/73 | HR 74 | Temp 97.7°F | Wt 134.7 lb

## 2022-03-10 DIAGNOSIS — C880 Waldenstrom macroglobulinemia: Secondary | ICD-10-CM

## 2022-03-10 DIAGNOSIS — R5383 Other fatigue: Secondary | ICD-10-CM | POA: Diagnosis not present

## 2022-03-10 DIAGNOSIS — K5903 Drug induced constipation: Secondary | ICD-10-CM | POA: Diagnosis not present

## 2022-03-10 DIAGNOSIS — R059 Cough, unspecified: Secondary | ICD-10-CM | POA: Diagnosis not present

## 2022-03-10 DIAGNOSIS — R634 Abnormal weight loss: Secondary | ICD-10-CM

## 2022-03-10 DIAGNOSIS — R110 Nausea: Secondary | ICD-10-CM | POA: Diagnosis not present

## 2022-03-10 DIAGNOSIS — R630 Anorexia: Secondary | ICD-10-CM | POA: Diagnosis not present

## 2022-03-10 DIAGNOSIS — F419 Anxiety disorder, unspecified: Secondary | ICD-10-CM

## 2022-03-10 DIAGNOSIS — D638 Anemia in other chronic diseases classified elsewhere: Secondary | ICD-10-CM | POA: Diagnosis not present

## 2022-03-10 DIAGNOSIS — G893 Neoplasm related pain (acute) (chronic): Secondary | ICD-10-CM | POA: Diagnosis not present

## 2022-03-10 DIAGNOSIS — Z515 Encounter for palliative care: Secondary | ICD-10-CM | POA: Diagnosis not present

## 2022-03-10 DIAGNOSIS — D472 Monoclonal gammopathy: Secondary | ICD-10-CM | POA: Diagnosis not present

## 2022-03-10 DIAGNOSIS — R112 Nausea with vomiting, unspecified: Secondary | ICD-10-CM | POA: Diagnosis not present

## 2022-03-10 DIAGNOSIS — R590 Localized enlarged lymph nodes: Secondary | ICD-10-CM | POA: Diagnosis not present

## 2022-03-10 DIAGNOSIS — R635 Abnormal weight gain: Secondary | ICD-10-CM | POA: Diagnosis not present

## 2022-03-10 DIAGNOSIS — I110 Hypertensive heart disease with heart failure: Secondary | ICD-10-CM | POA: Diagnosis not present

## 2022-03-10 DIAGNOSIS — I5022 Chronic systolic (congestive) heart failure: Secondary | ICD-10-CM | POA: Diagnosis not present

## 2022-03-10 DIAGNOSIS — Z5986 Financial insecurity: Secondary | ICD-10-CM | POA: Diagnosis not present

## 2022-03-10 LAB — CMP (CANCER CENTER ONLY)
ALT: 7 U/L (ref 0–44)
AST: 16 U/L (ref 15–41)
Albumin: 3.8 g/dL (ref 3.5–5.0)
Alkaline Phosphatase: 62 U/L (ref 38–126)
Anion gap: 7 (ref 5–15)
BUN: 18 mg/dL (ref 8–23)
CO2: 28 mmol/L (ref 22–32)
Calcium: 9.1 mg/dL (ref 8.9–10.3)
Chloride: 100 mmol/L (ref 98–111)
Creatinine: 1.33 mg/dL — ABNORMAL HIGH (ref 0.61–1.24)
GFR, Estimated: >60 mL/min (ref >60)
Glucose, Bld: 78 mg/dL (ref 70–99)
Potassium: 4.3 mmol/L (ref 3.5–5.1)
Sodium: 135 mmol/L (ref 135–145)
Total Bilirubin: 0.9 mg/dL (ref 0.3–1.2)
Total Protein: 5.9 g/dL — ABNORMAL LOW (ref 6.5–8.1)

## 2022-03-10 LAB — CBC WITH DIFFERENTIAL (CANCER CENTER ONLY)
Abs Immature Granulocytes: 0.01 10*3/uL (ref 0.00–0.07)
Basophils Absolute: 0.1 10*3/uL (ref 0.0–0.1)
Basophils Relative: 1 %
Eosinophils Absolute: 2.2 10*3/uL — ABNORMAL HIGH (ref 0.0–0.5)
Eosinophils Relative: 27 %
HCT: 43.4 % (ref 39.0–52.0)
Hemoglobin: 14.3 g/dL (ref 13.0–17.0)
Immature Granulocytes: 0 %
Lymphocytes Relative: 17 %
Lymphs Abs: 1.4 10*3/uL (ref 0.7–4.0)
MCH: 27.4 pg (ref 26.0–34.0)
MCHC: 32.9 g/dL (ref 30.0–36.0)
MCV: 83.1 fL (ref 80.0–100.0)
Monocytes Absolute: 0.7 10*3/uL (ref 0.1–1.0)
Monocytes Relative: 8 %
Neutro Abs: 3.8 10*3/uL (ref 1.7–7.7)
Neutrophils Relative %: 47 %
Platelet Count: 224 10*3/uL (ref 150–400)
RBC: 5.22 MIL/uL (ref 4.22–5.81)
RDW: 14.8 % (ref 11.5–15.5)
WBC Count: 8 10*3/uL (ref 4.0–10.5)
nRBC: 0 % (ref 0.0–0.2)

## 2022-03-10 LAB — LACTATE DEHYDROGENASE: LDH: 96 U/L — ABNORMAL LOW (ref 98–192)

## 2022-03-10 MED ORDER — OXYCODONE HCL 5 MG PO TABS
5.0000 mg | ORAL_TABLET | Freq: Four times a day (QID) | ORAL | 0 refills | Status: DC | PRN
Start: 2022-03-10 — End: 2022-03-10
  Filled 2022-03-10: qty 60, 15d supply, fill #0

## 2022-03-10 MED ORDER — OXYCODONE HCL 10 MG PO TABS
5.0000 mg | ORAL_TABLET | Freq: Four times a day (QID) | ORAL | 0 refills | Status: DC | PRN
  Filled 2022-03-10: qty 30, 15d supply, fill #0

## 2022-03-10 MED ORDER — METHADONE HCL 5 MG PO TABS
5.0000 mg | ORAL_TABLET | Freq: Three times a day (TID) | ORAL | 0 refills | Status: DC
  Filled 2022-03-10 – 2022-03-16 (×2): qty 90, 30d supply, fill #0

## 2022-03-10 MED ORDER — ESCITALOPRAM OXALATE 5 MG PO TABS
5.0000 mg | ORAL_TABLET | Freq: Every day | ORAL | 0 refills | Status: DC
  Filled 2022-03-10: qty 30, 30d supply, fill #0

## 2022-03-10 MED ORDER — ONDANSETRON HCL 8 MG PO TABS
8.0000 mg | ORAL_TABLET | Freq: Three times a day (TID) | ORAL | 0 refills | Status: DC | PRN
  Filled 2022-03-10: qty 30, 10d supply, fill #0

## 2022-03-10 NOTE — Progress Notes (Unsigned)
[place] Telephone:(336) [phone]   [phone]  PROGRESS NOTE  Patient Care Team: Delene Ruffini, MD as PCP - General (Internal Medicine) Pickenpack-Cousar, Carlena Sax, NP as Nurse Practitioner (Nurse Practitioner)  Hematological/Oncological History # Waldenstrom's Macroglobulinemia # IgM Monoclonal Gammopathy 04/07/2021: CT A/p showed mildly enlarged retroperitoneal lymph nodes and bilateral inguinal lymph nodes.  04/08/2021: IgM Kappa M protein 1.2 04/09/2021: WBC 11.1, Hgb 9.5, MCV 75.4, Plt 525 04/20/2021: Kappa 977, Lambda 7.8, K/L ratio 125.28. Serum viscosity 1.8 05/06/2021: Establish care with Dr. 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 Infusion reaction with ritux, held halfway through.  06/12/2021: Cycle 2 Day 1 of Ibrutinib + Rituximab 06/19/2021: Cycle 3 Day 1 of Ibrutinib + Rituximab 06/26/2021: Cycle 4 Day 1 of rituximab. Continued on daily ibrutinib 12/02/2021: Cycle 5 Day 1 of Ibrutinib + Rituximab 12/09/2021: Cycle 6 Day 1 of Ibrutinib + Rituximab 12/16/2021: Cycle 7 Day 1 of Ibrutinib + Rituximab 12/23/2021: Held Cycle 8 of Ritxumab due to nausea/vomiting, elevated creatinine, finger infection  Interval History:  Connor Solomon 61 y.o. male with medical history significant for Waldenstrom's macroglobulinemia who presents for a follow up visit. The patient's last visit was on 02/11/2022 In the interim since the last visit, he continued on Ibrutinib therapy.   On exam today Connor Solomon reports that he has been struggling with anxiety and his states that his "stomach is in knots". He took his brother's xanax and symptoms markedly improved. He continues to have fatigue but is able to do his normal routines. He continues to have a poor appetite and have lost 5 lbs since  02/23/2022. He reports nausea and occasional episodes of vomiting. He reports improvement with antiemetics. He reports constipation and last BM was 3 days ago. He reports that his pain is well controlled with current pain regimen with methadone and oxycodone. He denies fevers, chills, night sweats, shortness of breath, chest pain or cough. He has no other complaints.  Full 10 point ROS is listed below.   MEDICAL HISTORY:  Past Medical History:  Diagnosis Date   Acute heart failure ([place]) 04/08/2021   Acute HFrEF (heart failure with reduced ejection fraction) ([place]) 04/07/2021   Arthritis    hands, elbows bilaterally   Cancer ([facility])    Full dentures    Hypertension    Pt states he doen not have HTN and has never been treated for HTN   Kidney stone on left side last stone 4-5 yrs ago   recurrent (3 episodes)   Panic disorder    pt states has resolved   Syncope    resolved- was related to panic attacks    SURGICAL HISTORY: Past Surgical History:  Procedure Laterality Date   CARPAL METACARPAL FUSION WITH DISTAL RADIAL BONE GRAFT Left 05/30/2013   Procedure: LEFT THUMB METACARPAL JOINT FUSION;  Surgeon: Schuyler Amor, MD;  Location: [place];  Service: Orthopedics;  Laterality: Left;   ESOPHAGOGASTRODUODENOSCOPY N/A 02/12/2014   Procedure: ESOPHAGOGASTRODUODENOSCOPY (EGD);  Surgeon: Jerene Bears, MD;  Location: [facility] ENDOSCOPY;  Service: Endoscopy;  Laterality: N/A;   FOREIGN BODY REMOVAL N/A 02/12/2014   Procedure: FOREIGN BODY REMOVAL;  Surgeon: Jerene Bears, MD;  Location: [place];  Service: Endoscopy;  Laterality: N/A;   OPEN REDUCTION INTERNAL FIXATION (ORIF) DISTAL RADIAL FRACTURE Left 11/29/2012   Procedure: OPEN REDUCTION INTERNAL FIXATION (  ORIF) DISTAL RADIAL FRACTURE;  Surgeon: Schuyler Amor, MD;  Location: [place];  Service: Orthopedics;  Laterality: Left;  LEFT DISTAL RADIUS OSTEOTOMY WITH BONE GRAFT   RIGHT/LEFT HEART CATH AND CORONARY  ANGIOGRAPHY N/A 04/09/2021   Procedure: RIGHT/LEFT HEART CATH AND CORONARY ANGIOGRAPHY;  Surgeon: Jolaine Artist, MD;  Location: [place] CV LAB;  Service: Cardiovascular;  Laterality: N/A;   TENDON REPAIR Left 02/07/2013   Procedure: LEFT EXTENSOR INDICIS PROPRIUS  TO EXTENSOR POLLICIS LONGUS TENDON TRANSFER;  Surgeon: Schuyler Amor, MD;  Location: [place];  Service: Orthopedics;  Laterality: Left;   WISDOM TOOTH EXTRACTION     WRIST SURGERY     fx only    SOCIAL HISTORY: Social History   Socioeconomic History   Marital status: Single    Spouse name: Not on file   Number of children: Not on file   Years of education: Not on file   Highest education level: Not on file  Occupational History   Not on file  Tobacco Use   Smoking status: Former    Types: Cigars    Quit date: 10/11/2020    Years since quitting: 1.4   Smokeless tobacco: Never   Tobacco comments:    smokes 1 cigar a week  Vaping Use   Vaping Use: Never used  Substance and Sexual Activity   Alcohol use: No   Drug use: No   Sexual activity: Not on file  Other Topics Concern   Not on file  Social History Narrative   Not on file   Social Determinants of Health   Financial Resource Strain: High Risk   Difficulty of Paying Living Expenses: Very hard  Food Insecurity: Food Insecurity Present   Worried About Running Out of Food in the Last Year: Sometimes true   Ran Out of Food in the Last Year: Sometimes true  Transportation Needs: No Transportation Needs   Lack of Transportation (Medical): No   Lack of Transportation (Non-Medical): No  Physical Activity: Not on file  Stress: Stress Concern Present   Feeling of Stress : Very much  Social Connections: Not on file  Intimate Partner Violence: Not on file    FAMILY HISTORY: Family History  Problem Relation Age of Onset   Heart attack Father    Sudden Cardiac Death Father 52   Diabetes Neg Hx    Hyperlipidemia Neg Hx     Hypertension Neg Hx     ALLERGIES:  is allergic to rituxan [rituximab] and hydrocodone.  MEDICATIONS:  Current Outpatient Medications  Medication Sig Dispense Refill   acetaminophen (TYLENOL) 500 MG tablet Take 500 mg by mouth every 6 (six) hours as needed.     albuterol (VENTOLIN HFA) 108 (90 Base) MCG/ACT inhaler Inhale 2 puffs into the lungs every 6 hours as needed for wheezing or shortness of breath 18 g 0   allopurinol (ZYLOPRIM) 300 MG tablet TAKE 1 TABLET BY MOUTH EVERY DAY 90 tablet 1   carvedilol (COREG) 3.125 MG tablet Take 1 tablet by mouth 2 times daily. 68 tablet 2   empagliflozin (JARDIANCE) 10 MG TABS tablet Take 1 tablet by mouth daily before breakfast. 30 tablet 11   eplerenone (INSPRA) 25 MG tablet Take 1/2 tablet (12.5 mg total) by mouth daily. 15 tablet 11   ferrous sulfate 325 (65 FE) MG tablet Take 1 tablet by mouth daily with breakfast. Please take with a source of Vitamin C 90 tablet 3   furosemide (LASIX)  20 MG tablet Take 1 tablet (20 mg total) by mouth daily as needed for swelling 100 tablet 2   ibrutinib (IMBRUVICA) 420 MG tablet Take 1 tablet (420 mg total) by mouth daily. 30 tablet 2   methadone (DOLOPHINE) 5 MG tablet Take 1 tablet by mouth every 12 hours. 30 tablet 0   metoCLOPramide (REGLAN) 10 MG tablet Take 1 tablet (10 mg total) by mouth every 8 (eight) hours as needed for nausea. 30 tablet 1   naloxone (NARCAN) nasal spray 4 mg/0.1 mL Take for overdose 1 each 0   naloxone (NARCAN) nasal spray 4 mg/0.1 mL Take for overdose as directed 2 each 0   OLANZapine (ZYPREXA) 5 MG tablet TAKE 1 TABLET BY MOUTH EVERYDAY AT BEDTIME 90 tablet 2   ondansetron (ZOFRAN) 8 MG tablet Take 1 tablet by mouth every 8 hours as needed. 30 tablet 0   oxyCODONE (OXY IR/ROXICODONE) 5 MG immediate release tablet Take 1 tablet by mouth every 6 hours as needed for severe pain or breakthrough pain. 60 tablet 0   potassium chloride SA (KLOR-CON M) 20 MEQ tablet Take 1 tablet (20 mEq  total) by mouth daily. 10 tablet 0   sacubitril-valsartan (ENTRESTO) 24-26 MG Take 1 tablet by mouth 2 (two) times daily. 60 tablet 5   No current facility-administered medications for this visit.    REVIEW OF SYSTEMS:   Constitutional: ( - ) fevers, ( - )  chills , ( - ) night sweats Eyes: ( - ) blurriness of vision, ( - ) double vision, ( - ) watery eyes Ears, nose, mouth, throat, and face: ( - ) mucositis, ( - ) sore throat Respiratory: ( - ) cough, ( - ) dyspnea, ( - ) wheezes Cardiovascular: ( - ) palpitation, ( - ) chest discomfort, ( - ) lower extremity swelling Gastrointestinal:  ( + ) nausea, ( - ) heartburn, ( - ) change in bowel habits Skin: ( - ) abnormal skin rashes Lymphatics: ( - ) new lymphadenopathy, ( - ) easy bruising Neurological: ( - ) numbness, ( - ) tingling, ( - ) new weaknesses Behavioral/Psych: ( - ) mood change, ( - ) new changes  All other systems were reviewed with the patient and are negative.  PHYSICAL EXAMINATION: ECOG PERFORMANCE STATUS: 1 - Symptomatic but completely ambulatory  Vitals:   03/10/22 1351  BP: 108/73  Pulse: 74  Temp: 97.7 F (36.5 C)  SpO2: 99%     Filed Weights   03/10/22 1351  Weight: 134 lb 11.2 oz (61.1 kg)      GENERAL: Chronically ill appearing middle-aged Caucasian male, alert, no distress and comfortable SKIN: skin color, texture, turgor are normal, no rashes or significant lesions.  EYES: conjunctiva are pink and non-injected, sclera clear LUNGS: clear to auscultation and percussion with normal breathing effort HEART: regular rate & rhythm and no murmurs and no lower extremity edema  PSYCH: alert & oriented x 3, fluent speech NEURO: no focal motor/sensory deficits   LABORATORY DATA:  I have reviewed the data as listed    Latest Ref Rng & Units 03/10/2022    1:31 PM 02/11/2022   12:41 PM 01/28/2022    4:37 PM  CBC  WBC 4.0 - 10.5 K/uL 8.0   7.7   27.3    Hemoglobin 13.0 - 17.0 g/dL 14.3   10.1   14.2     Hematocrit 39.0 - 52.0 % 43.4   31.8   43.1  Platelets 150 - 400 K/uL 224   506   488         Latest Ref Rng & Units 02/11/2022   12:41 PM 01/28/2022    1:33 PM 12/30/2021   11:56 AM  CMP  Glucose 70 - 99 mg/dL 104   135   99    BUN 6 - 20 mg/dL $Remove'12   14   12    'EJrpTlj$ Creatinine 0.61 - 1.24 mg/dL 0.93   0.79   0.79    Sodium 135 - 145 mmol/L 135   132   138    Potassium 3.5 - 5.1 mmol/L 3.9   3.1   3.2    Chloride 98 - 111 mmol/L 105   96   108    CO2 22 - 32 mmol/L $RemoveB'25   27   24    'XtyawNsz$ Calcium 8.9 - 10.3 mg/dL 8.2   8.8   8.5    Total Protein 6.5 - 8.1 g/dL 5.2   6.1   5.5    Total Bilirubin 0.3 - 1.2 mg/dL 0.3   0.7   0.4    Alkaline Phos 38 - 126 U/L 97   118   68    AST 15 - 41 U/L $Remo'13   11   10    'dcoCV$ ALT 0 - 44 U/L $Remo'7   8   5      'jCYyf$ Lab Results  Component Value Date   MPROTEIN 0.1 (H) 01/28/2022   MPROTEIN 0.1 (H) 12/09/2021   MPROTEIN 0.1 (H) 12/02/2021   Lab Results  Component Value Date   KPAFRELGTCHN 26.0 (H) 01/28/2022   KPAFRELGTCHN 28.2 (H) 12/09/2021   KPAFRELGTCHN 37.1 (H) 12/02/2021   LAMBDASER 3.7 (L) 01/28/2022   LAMBDASER 3.8 (L) 12/09/2021   LAMBDASER 3.5 (L) 12/02/2021   KAPLAMBRATIO 7.03 (H) 01/28/2022   KAPLAMBRATIO 7.42 (H) 12/09/2021   KAPLAMBRATIO 10.60 (H) 12/02/2021    RADIOGRAPHIC STUDIES: No results found.  ASSESSMENT & PLAN Connor Solomon 61 y.o. male with medical history significant for Waldenstrom's macroglobulinemia who presents for a follow up visit.   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  There is no clear evidence of hyperviscosity syndrome at this time.  At this time we will plan to proceed with ibrutinib and rituximab therapy.  One could also consider Bendamustine and rituximab therapy but given the patient's degree of anemia I would prefer pursuing ibrutinib and rituximab at this time.  Additionally  ibrutinib/rituximab are category 1 recommendations per the NCCN guidelines.  The treatment will consist of ibrutinib 420 mg p.o. daily with rituximab on weeks 1 through 4 as well as weeks 27 through 30.  Previously we discussed the risks and benefits of therapy including (but not limited to) risk of bleeding, fatigue, atypical infections, and cardiac arrhythmias.  The patient voices understanding of this plan moving forward.   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 --will order monthly SPEP, SFLC, and UPEP --Started second round of weekly rituximab on 12/02/2021. Continued weekly x 4 weeks.   --Labs today were reviewed without any intervention needed. Pending SPEP and sFLC levels.  --last rituximab dose held  due to missed visits/illness.  --continues on ibrutinib 420 mg PO daily.  --RTC in 4 weeks with labs   #Pain Management -- Pain poorly controlled on hydrocodone therapy previously. --Patient recently admitted for opioid overdose with presumed fentanyl --Patient reports he borrowed a pain pill when he ran out of hydrocodone which caused his overdose --Pain management moving forward to be managed with palliative care.  Appreciate their assistance in the management of this patient --methadone and pain management per palliative care.   #Anemia 2/2 to Lymphoma-resolved --Hgb 14.3 , Plt 224 --There does appear to be a component of iron deficiency anemia as well.  Currently on PO ferrous sulfate to try and bolster his iron levels.  --Continue to monitor.  #Nausea and vomiting--improving -- Concern that the nausea and vomiting may not be related to the ibrutinib therapy and potentially  tied to poor gastric motility due to opioid prescription.  --Unable to tolerate olanzopine so discontinued --Not taking metoclopramide 10 mg every 8 hours  --continue zofran PRN.   #Supportive Care -- chemotherapy education complete -- port placement not required -- allopurinol $RemoveBefore'300mg'[id number]$  PO daily for TLS prophylaxis. D/c as patient is no longer on rituximab.   No orders of the defined types were placed in this encounter.  All questions were answered. The patient knows to call the clinic with any problems, questions or concerns.  I have spent a total of 30 minutes minutes of face-to-face and non-face-to-face time, preparing to see the patient,  performing a medically appropriate examination, counseling and educating the patient, ordering medications/tests, referring and communicating with other health care professionals, documenting clinical information in the electronic health record, and care coordination.   Lincoln Brigham, PA-C Department of Hematology/Oncology [facility] at [facility] Phone: [phone]   03/10/2022 2:05 PM  Dimopoulos MA, Rennie Natter, Lonzo Cloud, Mahe B, Herbaux C, Tam C, Orsucci L, Palomba ML, Matous JV, Shustik C, Kastritis E, [place], Li J, Salman Z, Graef T, Buske C; iNNOV[employer] Study Group and the [employer] for [employer]. Phase 3 Trial of Ibrutinib plus Rituximab in Waldenstrm's Macroglobulinemia. Alison Stalling J Med. 2018 Jun 21;378(25):2399-2410.   --At 30 months, the progression-free survival rate was 82% with ibrutinib-rituximab versus 28% with placebo-rituximab (hazard ratio for progression or death, 0.20; P<0.001).

## 2022-03-10 NOTE — Progress Notes (Unsigned)
[place]  [phone] Fax:(336) [phone]   Name: Connor Solomon Date: 03/10/2022 MRN: [id number]  DOB: 08-Sep-1961  Patient Care Team: Delene Ruffini, MD as PCP - General (Internal Medicine) Pickenpack-Cousar, Carlena Sax, NP as Nurse Practitioner (Nurse Practitioner)    REASON FOR CONSULTATION: Connor Solomon is a 61 y.o. 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 Palliative ask to see for symptom management.  SOCIAL HISTORY:     reports that he quit smoking about 16 months ago. His smoking use included cigars. He has never used smokeless tobacco. He reports that he does not drink alcohol and does not use drugs.  ADVANCE DIRECTIVES:    CODE STATUS:   PAST MEDICAL HISTORY: Past Medical History:  Diagnosis Date   Acute heart failure ([place]) 04/08/2021   Acute HFrEF (heart failure with reduced ejection fraction) ([place]) 04/07/2021   Arthritis    hands, elbows bilaterally   Cancer (HCC)    Full dentures    Hypertension    Pt states he doen not have HTN and has never been treated for HTN   Kidney stone on left side last stone 4-5 yrs ago   recurrent (3 episodes)   Panic disorder    pt states has resolved   Syncope    resolved- was related to panic attacks    PAST SURGICAL HISTORY:  Past Surgical History:  Procedure Laterality Date   CARPAL METACARPAL FUSION WITH DISTAL RADIAL BONE GRAFT Left 05/30/2013   Procedure: LEFT THUMB METACARPAL JOINT FUSION;  Surgeon: Schuyler Amor, MD;  Location: [place];  Service: Orthopedics;  Laterality: Left;   ESOPHAGOGASTRODUODENOSCOPY N/A 02/12/2014   Procedure: ESOPHAGOGASTRODUODENOSCOPY (EGD);  Surgeon: Jerene Bears, MD;  Location: [facility] ENDOSCOPY;  Service: Endoscopy;  Laterality: N/A;   FOREIGN BODY REMOVAL N/A 02/12/2014   Procedure: FOREIGN BODY REMOVAL;  Surgeon: Jerene Bears, MD;  Location: [place];  Service: Endoscopy;  Laterality: N/A;   OPEN REDUCTION INTERNAL FIXATION (ORIF) DISTAL RADIAL FRACTURE Left 11/29/2012   Procedure: OPEN REDUCTION INTERNAL FIXATION (ORIF) DISTAL RADIAL FRACTURE;  Surgeon: Schuyler Amor, MD;  Location: [place];  Service: Orthopedics;  Laterality: Left;  LEFT DISTAL RADIUS OSTEOTOMY WITH BONE GRAFT   RIGHT/LEFT HEART CATH AND CORONARY ANGIOGRAPHY N/A 04/09/2021   Procedure: RIGHT/LEFT HEART CATH AND CORONARY ANGIOGRAPHY;  Surgeon: Jolaine Artist, MD;  Location: [place] CV LAB;  Service: Cardiovascular;  Laterality: N/A;   TENDON REPAIR Left 02/07/2013   Procedure: LEFT EXTENSOR INDICIS PROPRIUS  TO EXTENSOR POLLICIS LONGUS TENDON TRANSFER;  Surgeon: Schuyler Amor, MD;  Location: [place];  Service: Orthopedics;  Laterality: Left;   WISDOM TOOTH EXTRACTION     WRIST SURGERY     fx only    HEMATOLOGY/ONCOLOGY HISTORY:  Oncology History  Waldenstrom macroglobulinemia ([place])  05/24/2021 Initial Diagnosis   Waldenstrom macroglobulinemia ([place])   06/05/2021 -  Chemotherapy   Patient is on Treatment Plan : NON-HODGKINS LYMPHOMA Rituximab q7d       ALLERGIES:  is allergic to rituxan [rituximab] and hydrocodone.  MEDICATIONS:  Current Outpatient Medications  Medication Sig Dispense Refill   escitalopram (LEXAPRO) 5 MG tablet Take 1 tablet (5 mg total) by mouth daily. 30 tablet 0   acetaminophen (TYLENOL) 500 MG tablet Take 500 mg by mouth every 6 (six) hours as needed.     albuterol (  VENTOLIN HFA) 108 (90 Base) MCG/ACT inhaler Inhale 2 puffs into the lungs every 6 hours as needed for wheezing or shortness of breath 18 g 0   allopurinol (ZYLOPRIM) 300 MG tablet TAKE 1 TABLET BY MOUTH EVERY DAY 90 tablet 1   carvedilol (COREG) 3.125 MG tablet Take 1 tablet by mouth 2 times daily. 68 tablet 2   empagliflozin (JARDIANCE) 10 MG TABS tablet Take 1 tablet by mouth daily before  breakfast. 30 tablet 11   eplerenone (INSPRA) 25 MG tablet Take 1/2 tablet (12.5 mg total) by mouth daily. 15 tablet 11   ferrous sulfate 325 (65 FE) MG tablet Take 1 tablet by mouth daily with breakfast. Please take with a source of Vitamin C 90 tablet 3   furosemide (LASIX) 20 MG tablet Take 1 tablet (20 mg total) by mouth daily as needed for swelling 100 tablet 2   ibrutinib (IMBRUVICA) 420 MG tablet Take 1 tablet (420 mg total) by mouth daily. 30 tablet 2   methadone (DOLOPHINE) 5 MG tablet Take 1 tablet (5 mg total) by mouth every 8 (eight) hours. 90 tablet 0   metoCLOPramide (REGLAN) 10 MG tablet Take 1 tablet (10 mg total) by mouth every 8 (eight) hours as needed for nausea. 30 tablet 1   naloxone (NARCAN) nasal spray 4 mg/0.1 mL Take for overdose 1 each 0   naloxone (NARCAN) nasal spray 4 mg/0.1 mL Take for overdose as directed 2 each 0   ondansetron (ZOFRAN) 8 MG tablet Take 1 tablet by mouth every 8 hours as needed. 30 tablet 0   oxyCODONE (OXY IR/ROXICODONE) 5 MG immediate release tablet Take 1 tablet by mouth every 6 hours as needed for severe pain or breakthrough pain. 60 tablet 0   potassium chloride SA (KLOR-CON M) 20 MEQ tablet Take 1 tablet (20 mEq total) by mouth daily. 10 tablet 0   sacubitril-valsartan (ENTRESTO) 24-26 MG Take 1 tablet by mouth 2 (two) times daily. 60 tablet 5   No current facility-administered medications for this visit.    VITAL SIGNS: There were no vitals taken for this visit. There were no vitals filed for this visit.   Estimated body mass index is 20.48 kg/m as calculated from the following:   Height as of 01/28/22: $RemoveBef'5\' 8"'cmGdgKuVUj$  (1.727 m).   Weight as of an earlier encounter on 03/10/22: 134 lb 11.2 oz (61.1 kg).   PERFORMANCE STATUS (ECOG) : 2 - Symptomatic, <50% confined to bed   Physical Exam General: NAD, ambulatory without device Cardiovascular: RRR Pulmonary: normal breathing pattern  Abdomen: soft, nontender, + bowel sounds Extremities: no  edema, no joint deformities Skin: no rashes Neurological: AAO x3, mood appropriate   IMPRESSION:  Mr. Gras presents to the clinic for symptom management follow-up. No acute distress noted. Ambulatory without assistive devices. Wife is present with him during visit today.   Neoplasm related pain Mr. Michels states his pain seems to be "about the same". There was some improvement but not a significant difference since starting on the methadone. He is taking as prescribed. Denies drowsiness. Tolerating well. Shares some days are better than others. Finds if he has an active day pain is more intense and he has to use his oxycodone for breakthrough more regularly.   Education provided on medication regimen and taking as prescribed. Given minimal improvement in his pain we will plan to increase frequency to twice daily. He has tolerated twice daily. We will plan to closely monitor.   Constipation Reports daily  or every other day bowel movements. Education provided on opioid induced constipation. Recommended Miralax daily. If no bowel movement in 48 hours he knows to increase to twice daily.   Appetite/decreased appetite.  Appetite waxes and wanes depending on the day. Is no longer taking the olanzapine as he felt it was not working as expected. He endorses drinking 1-2 Ensure daily. Encouraged to continue.   Weight has decreased some 134 lbs down from 139lbs 5/16 up from 131lbs 4/20, down from 147.8lbs on 3/22.   Discussed eating small frequent meals versus focusing on 3 large meals per day. Protein enriched foots. Eating when he has a desire. Denies nausea/vomiting. Will continue to closely monitor.   4. Anxiety/Anxiousness  Tarell shares concerns with ongoing anxiety and anxiousness. States his mind sometimes wanders making him have increased anxiety thinking about the unknown and what ifs. Emotional support provided. He took one of his relative's Xanax and felt this helped him tremendously.  Education provided on importance of not taking medications not prescribed as this could interfere with other medications or cause complications. We also discussed concerns with certain medications which could contribute to respiratory depression, lethargy in correlation with his pain medications.   He is emotional regarding his anxiety. Wife expresses concerns over this as well. Education provided on use of more long-term medication which can provide similar relief as Xanax. We will plan to start on Lexapro daily. Education provided on side effects in addition to importance on not stopping abruptly and taking as prescribed. He understands it may take some time for effectiveness and will not be an immediate response.   We will closely monitor.   5. Goals of care (5/16) Mr. Vanwey shares his realistic understanding of his condition. He and his wife are making sure all "affairs" are in order as his biggest worry is leaving his wife in a financial constraint. Reports communicating with his life insurance policy etc. Emotional support provided.   He is emotional expressing racing thoughts during idol time or interruptions in his sleep as his mind wonders thinking about worst case scenario. Is requesting something to assist with sleeping and anxiety. Acknowledged his request. Education provided on limited use in collaboration with other medications including zyprexa. He verbalized understanding and aware  we will continue to support and monitor for needs.    PLAN: methadone $RemoveBeforeD'5mg'vrOxIcumIMPHtr$  every 8 hours. Close monitoring. Dose increased from $RemoveBefore'5mg'dAYDFWFkZfdWV$  twice daily.  Oxycodone $RemoveBe'5mg'JFLTdXhVe$  as needed for breakthrough pain. Pharmacy called and $RemoveBe'5mg'fIVxNyMDz$  is on backorder. Request for new RX for 10 mg and patient will take 0.5 tablets. New prescription sent and patient aware of changes.  Miralax daily for constipation Lexapro daily  I will plan to see patient back in 2-4 weeks in collaboration to other oncology appointments.    Patient  expressed understanding and was in agreement with this plan. He also understands that He can call the clinic at any time with any questions, concerns, or complaints.   Thank you for your referral and allowing Palliative to assist in Mr. Rom[facility] care.   Number and complexity of problems addressed: 3 HIGH - 1 or more chronic illnesses with SEVERE exacerbation, progression, or side effects of treatment - advanced cancer, pain. Any controlled substances utilized were prescribed in the context of palliative care.   Time Total: 45 min   Visit consisted of counseling and education dealing with the complex and emotionally intense issues of symptom management and palliative care in the setting of serious and potentially  life-threatening illness.Greater than 50%  of this time was spent counseling and coordinating care related to the above assessment and plan.  Alda Lea, AGPCNP-BC  Palliative Medicine Team/[facility]

## 2022-03-11 ENCOUNTER — Encounter: Payer: Self-pay | Admitting: Hematology and Oncology

## 2022-03-11 DIAGNOSIS — Z419 Encounter for procedure for purposes other than remedying health state, unspecified: Secondary | ICD-10-CM | POA: Diagnosis not present

## 2022-03-11 LAB — KAPPA/LAMBDA LIGHT CHAINS
Kappa free light chain: 34.5 mg/L — ABNORMAL HIGH (ref 3.3–19.4)
Kappa, lambda light chain ratio: 9.32 — ABNORMAL HIGH (ref 0.26–1.65)
Lambda free light chains: 3.7 mg/L — ABNORMAL LOW (ref 5.7–26.3)

## 2022-03-12 ENCOUNTER — Telehealth: Payer: Self-pay | Admitting: Physician Assistant

## 2022-03-12 ENCOUNTER — Telehealth: Payer: Self-pay | Admitting: *Deleted

## 2022-03-12 NOTE — Telephone Encounter (Signed)
Received call from pt's significant other, Marlowe Kays. She states Connor Solomon has been vomiting daily 4-5 times every day for the last 4 days.  He is not able to keep anything down. He had labs here 3 days ago. Advised that some of his labs were a bit off - decreased HGB, increased creatinine. Advised that he needs to be evaluated in the ED. Wife states he doesn't like to go to the ED at [facility].  Advised that he does need to go as he can get very dehydrated with this much vomiting and that it is not likely related to his treatment here-imbruvica. He has been on this quite awhile without these side effects. Marlowe Kays voiced understanding. Asked her to keep [place] up to date.  She said she would.

## 2022-03-12 NOTE — Telephone Encounter (Signed)
Scheduled per 5/31 los, pt has been called and confirmed

## 2022-03-15 LAB — MULTIPLE MYELOMA PANEL, SERUM
Albumin SerPl Elph-Mcnc: 3.6 g/dL (ref 2.9–4.4)
Albumin/Glob SerPl: 2.2 — ABNORMAL HIGH (ref 0.7–1.7)
Alpha 1: 0.2 g/dL (ref 0.0–0.4)
Alpha2 Glob SerPl Elph-Mcnc: 0.6 g/dL (ref 0.4–1.0)
B-Globulin SerPl Elph-Mcnc: 0.7 g/dL (ref 0.7–1.3)
Gamma Glob SerPl Elph-Mcnc: 0.2 g/dL — ABNORMAL LOW (ref 0.4–1.8)
Globulin, Total: 1.7 g/dL — ABNORMAL LOW (ref 2.2–3.9)
IgA: 7 mg/dL — ABNORMAL LOW (ref 61–437)
IgG (Immunoglobin G), Serum: 129 mg/dL — ABNORMAL LOW (ref 603–1613)
IgM (Immunoglobulin M), Srm: 115 mg/dL (ref 20–172)
M Protein SerPl Elph-Mcnc: 0.1 g/dL — ABNORMAL HIGH
Total Protein ELP: 5.3 g/dL — ABNORMAL LOW (ref 6.0–8.5)

## 2022-03-16 ENCOUNTER — Encounter: Payer: Self-pay | Admitting: Hematology and Oncology

## 2022-03-16 ENCOUNTER — Other Ambulatory Visit (HOSPITAL_COMMUNITY): Payer: Self-pay

## 2022-03-17 ENCOUNTER — Encounter: Payer: Self-pay | Admitting: Nurse Practitioner

## 2022-03-17 ENCOUNTER — Other Ambulatory Visit (HOSPITAL_COMMUNITY): Payer: Self-pay

## 2022-03-17 ENCOUNTER — Inpatient Hospital Stay: Payer: Medicaid Other | Attending: Hematology and Oncology | Admitting: Nurse Practitioner

## 2022-03-17 DIAGNOSIS — R590 Localized enlarged lymph nodes: Secondary | ICD-10-CM | POA: Insufficient documentation

## 2022-03-17 DIAGNOSIS — K5903 Drug induced constipation: Secondary | ICD-10-CM | POA: Diagnosis not present

## 2022-03-17 DIAGNOSIS — Z5941 Food insecurity: Secondary | ICD-10-CM | POA: Insufficient documentation

## 2022-03-17 DIAGNOSIS — Z79620 Long term (current) use of immunosuppressive biologic: Secondary | ICD-10-CM | POA: Insufficient documentation

## 2022-03-17 DIAGNOSIS — Z87891 Personal history of nicotine dependence: Secondary | ICD-10-CM | POA: Insufficient documentation

## 2022-03-17 DIAGNOSIS — K5900 Constipation, unspecified: Secondary | ICD-10-CM | POA: Diagnosis not present

## 2022-03-17 DIAGNOSIS — Z79899 Other long term (current) drug therapy: Secondary | ICD-10-CM | POA: Insufficient documentation

## 2022-03-17 DIAGNOSIS — C880 Waldenstrom macroglobulinemia: Secondary | ICD-10-CM | POA: Diagnosis not present

## 2022-03-17 DIAGNOSIS — F419 Anxiety disorder, unspecified: Secondary | ICD-10-CM | POA: Insufficient documentation

## 2022-03-17 DIAGNOSIS — Z91148 Patient's other noncompliance with medication regimen for other reason: Secondary | ICD-10-CM | POA: Insufficient documentation

## 2022-03-17 DIAGNOSIS — D472 Monoclonal gammopathy: Secondary | ICD-10-CM | POA: Insufficient documentation

## 2022-03-17 DIAGNOSIS — G893 Neoplasm related pain (acute) (chronic): Secondary | ICD-10-CM | POA: Diagnosis not present

## 2022-03-17 DIAGNOSIS — B084 Enteroviral vesicular stomatitis with exanthem: Secondary | ICD-10-CM | POA: Diagnosis not present

## 2022-03-17 DIAGNOSIS — R630 Anorexia: Secondary | ICD-10-CM

## 2022-03-17 DIAGNOSIS — I110 Hypertensive heart disease with heart failure: Secondary | ICD-10-CM | POA: Diagnosis not present

## 2022-03-17 DIAGNOSIS — Z8249 Family history of ischemic heart disease and other diseases of the circulatory system: Secondary | ICD-10-CM | POA: Diagnosis not present

## 2022-03-17 DIAGNOSIS — M7989 Other specified soft tissue disorders: Secondary | ICD-10-CM | POA: Diagnosis not present

## 2022-03-17 DIAGNOSIS — Z5986 Financial insecurity: Secondary | ICD-10-CM | POA: Diagnosis not present

## 2022-03-17 DIAGNOSIS — T402X5A Adverse effect of other opioids, initial encounter: Secondary | ICD-10-CM | POA: Diagnosis not present

## 2022-03-17 DIAGNOSIS — Z885 Allergy status to narcotic agent status: Secondary | ICD-10-CM | POA: Diagnosis not present

## 2022-03-17 DIAGNOSIS — I5022 Chronic systolic (congestive) heart failure: Secondary | ICD-10-CM | POA: Diagnosis not present

## 2022-03-17 DIAGNOSIS — D509 Iron deficiency anemia, unspecified: Secondary | ICD-10-CM | POA: Insufficient documentation

## 2022-03-17 DIAGNOSIS — Z515 Encounter for palliative care: Secondary | ICD-10-CM | POA: Diagnosis not present

## 2022-03-17 NOTE — Progress Notes (Signed)
[place]  Telephone:(336) [phone] [phone]) [phone]   Name: Connor Solomon Date: 03/17/2022 MRN: [id number]  DOB: [birth year]  Patient Care Team: Delene Ruffini, MD as PCP - General (Internal Medicine) Pickenpack-Cousar, Carlena Sax, NP as Nurse Practitioner (Nurse Practitioner)   I connected with Connor Solomon on 03/17/22 at 11:00 AM EDT by phone and verified that I am speaking with the correct person using two identifiers.   I discussed the limitations, risks, security and privacy concerns of performing an evaluation and management service by telemedicine and the availability of in-person appointments. I also discussed with the patient that there may be a patient responsible charge related to this service. The patient expressed understanding and agreed to proceed.   Other persons participating in the visit and their role in the encounter: [place], RN and wife, Connor Solomon   Patient's location: Home  Provider's location: [place]    Chief Complaint: Symptom Management follow-up   REASON FOR CONSULTATION: Connor Solomon is a 61 y.o. 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 Palliative ask to see for symptom management.  SOCIAL HISTORY:     reports that he quit smoking about 17 months ago. His smoking use included cigars. He has never used smokeless tobacco. He reports that he does not drink alcohol and does not use drugs.  ADVANCE DIRECTIVES:    CODE STATUS:   PAST MEDICAL HISTORY: Past Medical History:  Diagnosis Date   Acute heart failure ([place]) 04/08/2021   Acute HFrEF (heart failure with reduced ejection fraction) ([place]) 04/07/2021   Arthritis    hands, elbows bilaterally   Cancer (HCC)    Full dentures    Hypertension    Pt states he doen not have HTN and has never been treated for HTN   Kidney stone on left side last stone 4-5 yrs  ago   recurrent (3 episodes)   Panic disorder    pt states has resolved   Syncope    resolved- was related to panic attacks    PAST SURGICAL HISTORY:  Past Surgical History:  Procedure Laterality Date   CARPAL METACARPAL FUSION WITH DISTAL RADIAL BONE GRAFT Left 05/30/2013   Procedure: LEFT THUMB METACARPAL JOINT FUSION;  Surgeon: Schuyler Amor, MD;  Location: [place];  Service: Orthopedics;  Laterality: Left;   ESOPHAGOGASTRODUODENOSCOPY N/A 02/12/2014   Procedure: ESOPHAGOGASTRODUODENOSCOPY (EGD);  Surgeon: Jerene Bears, MD;  Location: [facility] ENDOSCOPY;  Service: Endoscopy;  Laterality: N/A;   FOREIGN BODY REMOVAL N/A 02/12/2014   Procedure: FOREIGN BODY REMOVAL;  Surgeon: Jerene Bears, MD;  Location: [place];  Service: Endoscopy;  Laterality: N/A;   OPEN REDUCTION INTERNAL FIXATION (ORIF) DISTAL RADIAL FRACTURE Left 11/29/2012   Procedure: OPEN REDUCTION INTERNAL FIXATION (ORIF) DISTAL RADIAL FRACTURE;  Surgeon: Schuyler Amor, MD;  Location: [place];  Service: Orthopedics;  Laterality: Left;  LEFT DISTAL RADIUS OSTEOTOMY WITH BONE GRAFT   RIGHT/LEFT HEART CATH AND CORONARY ANGIOGRAPHY N/A 04/09/2021   Procedure: RIGHT/LEFT HEART CATH AND CORONARY ANGIOGRAPHY;  Surgeon: Jolaine Artist, MD;  Location: [place] CV LAB;  Service: Cardiovascular;  Laterality: N/A;   TENDON REPAIR Left 02/07/2013   Procedure: LEFT EXTENSOR INDICIS PROPRIUS  TO EXTENSOR POLLICIS LONGUS TENDON TRANSFER;  Surgeon: Schuyler Amor, MD;  Location: [place];  Service: Orthopedics;  Laterality: Left;   WISDOM TOOTH EXTRACTION  WRIST SURGERY     fx only    HEMATOLOGY/ONCOLOGY HISTORY:  Oncology History  Waldenstrom macroglobulinemia ([place])  05/24/2021 Initial Diagnosis   Waldenstrom macroglobulinemia ([place])   06/05/2021 -  Chemotherapy   Patient is on Treatment Plan : NON-HODGKINS LYMPHOMA Rituximab q7d       ALLERGIES:  is allergic to  rituxan [rituximab] and hydrocodone.  MEDICATIONS:  Current Outpatient Medications  Medication Sig Dispense Refill   acetaminophen (TYLENOL) 500 MG tablet Take 500 mg by mouth every 6 (six) hours as needed.     albuterol (VENTOLIN HFA) 108 (90 Base) MCG/ACT inhaler Inhale 2 puffs into the lungs every 6 hours as needed for wheezing or shortness of breath 18 g 0   carvedilol (COREG) 3.125 MG tablet Take 1 tablet by mouth 2 times daily. 68 tablet 2   empagliflozin (JARDIANCE) 10 MG TABS tablet Take 1 tablet by mouth daily before breakfast. 30 tablet 11   eplerenone (INSPRA) 25 MG tablet Take 1/2 tablet (12.5 mg total) by mouth daily. 15 tablet 11   escitalopram (LEXAPRO) 5 MG tablet Take 1 tablet (5 mg total) by mouth daily. 30 tablet 0   ferrous sulfate 325 (65 FE) MG tablet Take 1 tablet by mouth daily with breakfast. Please take with a source of Vitamin C 90 tablet 3   furosemide (LASIX) 20 MG tablet Take 1 tablet (20 mg total) by mouth daily as needed for swelling 100 tablet 2   ibrutinib (IMBRUVICA) 420 MG tablet Take 1 tablet (420 mg total) by mouth daily. 30 tablet 2   methadone (DOLOPHINE) 5 MG tablet Take 1 tablet (5 mg total) by mouth every 8 (eight) hours. 90 tablet 0   metoCLOPramide (REGLAN) 10 MG tablet Take 1 tablet (10 mg total) by mouth every 8 (eight) hours as needed for nausea. 30 tablet 1   naloxone (NARCAN) nasal spray 4 mg/0.1 mL Take for overdose 1 each 0   naloxone (NARCAN) nasal spray 4 mg/0.1 mL Take for overdose as directed 2 each 0   ondansetron (ZOFRAN) 8 MG tablet Take 1 tablet by mouth every 8 hours as needed. 30 tablet 0   Oxycodone HCl 10 MG TABS Take 1/2 tablet (5 mg total) by mouth every 6 (six) hours as needed for severe pain or breakthrough pain. 30 tablet 0   potassium chloride SA (KLOR-CON M) 20 MEQ tablet Take 1 tablet (20 mEq total) by mouth daily. 10 tablet 0   sacubitril-valsartan (ENTRESTO) 24-26 MG Take 1 tablet by mouth 2 (two) times daily. 60 tablet 5    No current facility-administered medications for this visit.    VITAL SIGNS: There were no vitals taken for this visit. There were no vitals filed for this visit.   Estimated body mass index is 20.48 kg/m as calculated from the following:   Height as of 01/28/22: $RemoveBef'5\' 8"'ADtJYiIpkM$  (1.727 m).   Weight as of 03/10/22: 134 lb 11.2 oz (61.1 kg).   PERFORMANCE STATUS (ECOG) : 2 - Symptomatic, <50% confined to bed   IMPRESSION:  I connected with Connor Solomon via phone for symptom management follow-up. Unfortunately he is complaining of some nausea today. Reports this occurs intermittently. He has vomited twice this morning most recently minutes prior to our phone visit. Instructed him to take zofran. He and wife verbalized understanding.   Neoplasm related pain We increased Connor Solomon's methadone to every 8 hours on 5/31. He reports tolerating well with better pain control. He has noticed some improvement since recent  change which he is appreciative of. Taking as prescribed. Denies drowsiness.   We will plan to continue close monitoring and adjust as needed.   Constipation Reports daily or every other day bowel movements. Education provided on opioid induced constipation. Recommended Miralax daily. If no bowel movement in 48 hours he knows to increase to twice daily.   Appetite/decreased appetite.  Appetite waxes and wanes depending on the day and if he is experiencing nausea. Is no longer taking the olanzapine as he felt it was not working as expected. He endorses drinking 1-2 Ensure daily. Encouraged to continue.   Weight decreased at last office visit: 134 lbs down from 139lbs 5/16 up from 131lbs 4/20, down from 147.8lbs on 3/22.   Education provided on eating small frequent meals versus focusing on 3 large meals per day. Protein enriched foots. Eating when he has a desire. Denies nausea/vomiting. Will continue to closely monitor.   4. Anxiety/Anxiousness  Betzalel was started on escitalopram during  office visit on 5/31. He is tolerating well. Reports he has not notice much of a change thus far. Education provided on tolerance and noticeable change which may take several days/weeks. Encouraged to continue with medication as prescribed allowing ongoing monitoring and adjustments as needed. He verbalized understanding and appreciation.   Chrisopher shares concerns with ongoing anxiety and anxiousness. States his mind sometimes wanders making him have increased anxiety thinking about the unknown and what ifs. Emotional support provided. He took one of his relative's Xanax and felt this helped him tremendously. Education provided on importance of not taking medications not prescribed as this could interfere with other medications or cause complications. We also discussed concerns with certain medications which could contribute to respiratory depression, lethargy in correlation with his pain medications.   He is emotional regarding his anxiety. Wife expresses concerns over this as well. Education provided on use of more long-term medication which can provide similar relief as Xanax. We will plan to start on Lexapro daily. Education provided on side effects in addition to importance on not stopping abruptly and taking as prescribed. He understands it may take some time for effectiveness and will not be an immediate response.   We will closely monitor.   5. Goals of care (5/16) Mr. Marhefka shares his realistic understanding of his condition. He and his wife are making sure all "affairs" are in order as his biggest worry is leaving his wife in a financial constraint. Reports communicating with his life insurance policy etc. Emotional support provided.   He is emotional expressing racing thoughts during idol time or interruptions in his sleep as his mind wonders thinking about worst case scenario. Is requesting something to assist with sleeping and anxiety. Acknowledged his request. Education provided on limited use  in collaboration with other medications including zyprexa. He verbalized understanding and aware  we will continue to support and monitor for needs.    PLAN: methadone $RemoveBeforeD'5mg'FUZUwpVcDLixfS$  every 8 hours. Close monitoring. Tolerating well.  Oxycodone $RemoveBe'5mg'dffqetVYw$  as needed for breakthrough pain. Pharmacy called 5/31 sharing that $RemoveBefo'5mg'nOedLygpwwe$  is on backorder. Request for new RX for 10 mg and patient will take 0.5 tablets.  Miralax daily for constipation Lexapro daily-tolerating well. Will continue to monitor and adjust accordingly. Zofran as needed for nausea as prescribed.  I will plan to see patient back in 2-4 weeks in collaboration to other oncology appointments.    Patient expressed understanding and was in agreement with this plan. He also understands that He can call the clinic at  any time with any questions, concerns, or complaints.   Thank you for your referral and allowing Palliative to assist in Mr. [facility] care.   Number and complexity of problems addressed: 3 HIGH - 1 or more chronic illnesses with SEVERE exacerbation, progression, or side effects of treatment - advanced cancer, pain. Any controlled substances utilized were prescribed in the context of palliative care.  Number and complexity of problems addressed: 3 HIGH - 1 or more chronic illnesses with SEVERE exacerbation, progression, or side effects of treatment - advanced cancer, pain. Any controlled substances utilized were prescribed in the context of palliative care.   Time Total: 40 min  Visit consisted of counseling and education dealing with the complex and emotionally intense issues of symptom management and palliative care in the setting of serious and potentially life-threatening illness.Greater than 50%  of this time was spent counseling and coordinating care related to the above assessment and plan.  Alda Lea, AGPCNP-BC  Palliative Medicine Team/[place]

## 2022-03-20 ENCOUNTER — Other Ambulatory Visit: Payer: Self-pay

## 2022-03-22 ENCOUNTER — Other Ambulatory Visit (HOSPITAL_COMMUNITY): Payer: Self-pay

## 2022-03-23 ENCOUNTER — Other Ambulatory Visit (HOSPITAL_COMMUNITY): Payer: Self-pay

## 2022-03-23 ENCOUNTER — Telehealth: Payer: Self-pay

## 2022-03-23 ENCOUNTER — Telehealth: Payer: Self-pay | Admitting: Hematology and Oncology

## 2022-03-23 NOTE — Telephone Encounter (Signed)
Per 6/13 provider reschedule left message with details and call back number

## 2022-03-23 NOTE — Telephone Encounter (Signed)
Pt called to inform us of upcoming refill for oxycodone, no further needs at this time.

## 2022-03-24 ENCOUNTER — Other Ambulatory Visit (HOSPITAL_COMMUNITY): Payer: Self-pay

## 2022-03-24 ENCOUNTER — Encounter: Payer: Self-pay | Admitting: Nurse Practitioner

## 2022-03-24 ENCOUNTER — Inpatient Hospital Stay (HOSPITAL_BASED_OUTPATIENT_CLINIC_OR_DEPARTMENT_OTHER): Payer: Medicaid Other | Admitting: Nurse Practitioner

## 2022-03-24 DIAGNOSIS — Z515 Encounter for palliative care: Secondary | ICD-10-CM

## 2022-03-24 DIAGNOSIS — Z79620 Long term (current) use of immunosuppressive biologic: Secondary | ICD-10-CM | POA: Diagnosis not present

## 2022-03-24 DIAGNOSIS — B084 Enteroviral vesicular stomatitis with exanthem: Secondary | ICD-10-CM | POA: Diagnosis not present

## 2022-03-24 DIAGNOSIS — C880 Waldenstrom macroglobulinemia: Secondary | ICD-10-CM

## 2022-03-24 DIAGNOSIS — G893 Neoplasm related pain (acute) (chronic): Secondary | ICD-10-CM | POA: Diagnosis not present

## 2022-03-24 DIAGNOSIS — T402X5A Adverse effect of other opioids, initial encounter: Secondary | ICD-10-CM | POA: Diagnosis not present

## 2022-03-24 DIAGNOSIS — K5903 Drug induced constipation: Secondary | ICD-10-CM | POA: Diagnosis not present

## 2022-03-24 DIAGNOSIS — D472 Monoclonal gammopathy: Secondary | ICD-10-CM | POA: Diagnosis not present

## 2022-03-24 DIAGNOSIS — R110 Nausea: Secondary | ICD-10-CM

## 2022-03-24 DIAGNOSIS — F419 Anxiety disorder, unspecified: Secondary | ICD-10-CM | POA: Diagnosis not present

## 2022-03-24 DIAGNOSIS — I5022 Chronic systolic (congestive) heart failure: Secondary | ICD-10-CM | POA: Diagnosis not present

## 2022-03-24 DIAGNOSIS — I110 Hypertensive heart disease with heart failure: Secondary | ICD-10-CM | POA: Diagnosis not present

## 2022-03-24 DIAGNOSIS — M7989 Other specified soft tissue disorders: Secondary | ICD-10-CM | POA: Diagnosis not present

## 2022-03-24 DIAGNOSIS — D509 Iron deficiency anemia, unspecified: Secondary | ICD-10-CM | POA: Diagnosis not present

## 2022-03-24 DIAGNOSIS — R590 Localized enlarged lymph nodes: Secondary | ICD-10-CM | POA: Diagnosis not present

## 2022-03-24 MED ORDER — OXYCODONE HCL 10 MG PO TABS
5.0000 mg | ORAL_TABLET | Freq: Four times a day (QID) | ORAL | 0 refills | Status: DC | PRN
Start: 2022-03-24 — End: 2022-04-06
  Filled 2022-03-24: qty 30, 15d supply, fill #0

## 2022-03-24 NOTE — Progress Notes (Signed)
[place]  Telephone:(336) [phone] Fax:(336) [phone]   Name: BENTZION DAURIA Date: 03/24/2022 MRN: [id number]  DOB: 20-Oct-1960  Patient Care Team: Delene Ruffini, MD as PCP - General (Internal Medicine) Pickenpack-Cousar, Carlena Sax, NP as Nurse Practitioner (Nurse Practitioner)   I connected with [name] on 03/24/22 at 10:00 AM EDT by phone and verified that I am speaking with the correct person using two identifiers.   I discussed the limitations, risks, security and privacy concerns of performing an evaluation and management service by telemedicine and the availability of in-person appointments. I also discussed with the patient that there may be a patient responsible charge related to this service. The patient expressed understanding and agreed to proceed.   Other persons participating in the visit and their role in the encounter: Maygan, RN and wife, Marlowe Kays   Patient's location: Home  Provider's location: [place]    Chief Complaint: Symptom Management follow-up   REASON FOR CONSULTATION: YASMIN DIBELLO is a 61 y.o. 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 Palliative ask to see for symptom management.  SOCIAL HISTORY:     reports that he quit smoking about 17 months ago. His smoking use included cigars. He has never used smokeless tobacco. He reports that he does not drink alcohol and does not use drugs.  ADVANCE DIRECTIVES:    CODE STATUS:   PAST MEDICAL HISTORY: Past Medical History:  Diagnosis Date   Acute heart failure ([place]) 04/08/2021   Acute HFrEF (heart failure with reduced ejection fraction) ([place]) 04/07/2021   Arthritis    hands, elbows bilaterally   Cancer (HCC)    Full dentures    Hypertension    Pt states he doen not have HTN and has never been treated for HTN   Kidney stone on left side last stone 4-5  yrs ago   recurrent (3 episodes)   Panic disorder    pt states has resolved   Syncope    resolved- was related to panic attacks    PAST SURGICAL HISTORY:  Past Surgical History:  Procedure Laterality Date   CARPAL METACARPAL FUSION WITH DISTAL RADIAL BONE GRAFT Left 05/30/2013   Procedure: LEFT THUMB METACARPAL JOINT FUSION;  Surgeon: Schuyler Amor, MD;  Location: [place];  Service: Orthopedics;  Laterality: Left;   ESOPHAGOGASTRODUODENOSCOPY N/A 02/12/2014   Procedure: ESOPHAGOGASTRODUODENOSCOPY (EGD);  Surgeon: Jerene Bears, MD;  Location[facility];  Service: Endoscopy;  Laterality: N/A;   FOREIGN BODY REMOVAL N/A 02/12/2014   Procedure: FOREIGN BODY REMOVAL;  Surgeon: Jerene Bears, MD;  Location: [place];  Service: Endoscopy;  Laterality: N/A;   OPEN REDUCTION INTERNAL FIXATION (ORIF) DISTAL RADIAL FRACTURE Left 11/29/2012   Procedure: OPEN REDUCTION INTERNAL FIXATION (ORIF) DISTAL RADIAL FRACTURE;  Surgeon: Schuyler Amor, MD;  Location: [place];  Service: Orthopedics;  Laterality: Left;  LEFT DISTAL RADIUS OSTEOTOMY WITH BONE GRAFT   RIGHT/LEFT HEART CATH AND CORONARY ANGIOGRAPHY N/A 04/09/2021   Procedure: RIGHT/LEFT HEART CATH AND CORONARY ANGIOGRAPHY;  Surgeon: Jolaine Artist, MD;  Location: [place] CV LAB;  Service: Cardiovascular;  Laterality: N/A;   TENDON REPAIR Left 02/07/2013   Procedure: LEFT EXTENSOR INDICIS PROPRIUS  TO EXTENSOR POLLICIS LONGUS TENDON TRANSFER;  Surgeon: Schuyler Amor, MD;  Location: [place];  Service: Orthopedics;  Laterality: Left;   WISDOM TOOTH EXTRACTION  WRIST SURGERY     fx only    HEMATOLOGY/ONCOLOGY HISTORY:  Oncology History  Waldenstrom macroglobulinemia ([place])  05/24/2021 Initial Diagnosis   Waldenstrom macroglobulinemia ([place])   06/05/2021 -  Chemotherapy   Patient is on Treatment Plan : NON-HODGKINS LYMPHOMA Rituximab q7d       ALLERGIES:  is allergic to  rituxan [rituximab] and hydrocodone.  MEDICATIONS:  Current Outpatient Medications  Medication Sig Dispense Refill   acetaminophen (TYLENOL) 500 MG tablet Take 500 mg by mouth every 6 (six) hours as needed.     albuterol (VENTOLIN HFA) 108 (90 Base) MCG/ACT inhaler Inhale 2 puffs into the lungs every 6 hours as needed for wheezing or shortness of breath 18 g 0   carvedilol (COREG) 3.125 MG tablet Take 1 tablet by mouth 2 times daily. 68 tablet 2   empagliflozin (JARDIANCE) 10 MG TABS tablet Take 1 tablet by mouth daily before breakfast. 30 tablet 11   eplerenone (INSPRA) 25 MG tablet Take 1/2 tablet (12.5 mg total) by mouth daily. 15 tablet 11   escitalopram (LEXAPRO) 5 MG tablet Take 1 tablet (5 mg total) by mouth daily. 30 tablet 0   ferrous sulfate 325 (65 FE) MG tablet Take 1 tablet by mouth daily with breakfast. Please take with a source of Vitamin C 90 tablet 3   furosemide (LASIX) 20 MG tablet Take 1 tablet (20 mg total) by mouth daily as needed for swelling 100 tablet 2   ibrutinib (IMBRUVICA) 420 MG tablet Take 1 tablet (420 mg total) by mouth daily. 30 tablet 2   methadone (DOLOPHINE) 5 MG tablet Take 1 tablet (5 mg total) by mouth every 8 (eight) hours. 90 tablet 0   metoCLOPramide (REGLAN) 10 MG tablet Take 1 tablet (10 mg total) by mouth every 8 (eight) hours as needed for nausea. 30 tablet 1   naloxone (NARCAN) nasal spray 4 mg/0.1 mL Take for overdose 1 each 0   naloxone (NARCAN) nasal spray 4 mg/0.1 mL Take for overdose as directed 2 each 0   ondansetron (ZOFRAN) 8 MG tablet Take 1 tablet by mouth every 8 hours as needed. 30 tablet 0   Oxycodone HCl 10 MG TABS Take 1/2 tablet (5 mg total) by mouth every 6 (six) hours as needed for severe pain or breakthrough pain. 30 tablet 0   potassium chloride SA (KLOR-CON M) 20 MEQ tablet Take 1 tablet (20 mEq total) by mouth daily. 10 tablet 0   sacubitril-valsartan (ENTRESTO) 24-26 MG Take 1 tablet by mouth 2 (two) times daily. 60 tablet 5    No current facility-administered medications for this visit.    VITAL SIGNS: There were no vitals taken for this visit. There were no vitals filed for this visit.   Estimated body mass index is 20.48 kg/m as calculated from the following:   Height as of 01/28/22: $RemoveBef'5\' 8"'YqCLNGJXuS$  (1.727 m).   Weight as of 03/10/22: 134 lb 11.2 oz (61.1 kg).   PERFORMANCE STATUS (ECOG) : 2 - Symptomatic, <50% confined to bed   IMPRESSION:  I connected with Mr. Ponciano via phone for symptom management follow-up. Feeling much better today compared to previous encounter. Denies nausea, constipation, or diarrhea.   Neoplasm related pain Pain is improved. He is tolerating methadone every 8 hours. This was increased on 5/31. Taking as prescribed. Denies drowsiness.   We will plan to continue close monitoring and adjust as needed.   Constipation Reports daily or every other day bowel movements. Education provided on opioid  induced constipation. Recommended Miralax daily. If no bowel movement in 48 hours he knows to increase to twice daily.   Appetite/decreased appetite.  Appetite waxes and wanes depending on the day and if he is experiencing nausea. Is no longer taking the olanzapine as he felt it was not working as expected. He endorses drinking 1-2 Ensure daily. Encouraged to continue.   Weight decreased at last office visit: 134 lbs down from 139lbs 5/16 up from 131lbs 4/20, down from 147.8lbs on 3/22.   Education provided on eating small frequent meals versus focusing on 3 large meals per day. Protein enriched foots. Eating when he has a desire. Denies nausea/vomiting. Will continue to closely monitor.   4. Anxiety/Anxiousness  Gerell was started on escitalopram during office visit on 5/31. He is tolerating well. Reports he has not notice much of a change thus far. Education provided on tolerance and noticeable change which may take several days/weeks. We will increase dose to $Remov'10mg'iZvShV$  daily and continue to closely  monitor. Encouraged to continue with medication as prescribed allowing ongoing monitoring and adjustments as needed. He verbalized understanding and appreciation.   Kamaron shares concerns with ongoing anxiety and anxiousness. States his mind sometimes wanders making him have increased anxiety thinking about the unknown and what ifs. Emotional support provided. He took one of his relative's Xanax and felt this helped him tremendously. Education provided on importance of not taking medications not prescribed as this could interfere with other medications or cause complications. We also discussed concerns with certain medications which could contribute to respiratory depression, lethargy in correlation with his pain medications.   He is emotional regarding his anxiety. Wife expresses concerns over this as well. Education provided on use of more long-term medication which can provide similar relief as Xanax. We will plan to start on Lexapro daily. Education provided on side effects in addition to importance on not stopping abruptly and taking as prescribed. He understands it may take some time for effectiveness and will not be an immediate response.   We will closely monitor.   5. Goals of care (5/16) Mr. Levit shares his realistic understanding of his condition. He and his wife are making sure all "affairs" are in order as his biggest worry is leaving his wife in a financial constraint. Reports communicating with his life insurance policy etc. Emotional support provided.   He is emotional expressing racing thoughts during idol time or interruptions in his sleep as his mind wonders thinking about worst case scenario. Is requesting something to assist with sleeping and anxiety. Acknowledged his request. Education provided on limited use in collaboration with other medications including zyprexa. He verbalized understanding and aware  we will continue to support and monitor for needs.    PLAN: methadone $RemoveBeforeD'5mg'VnUxdKREUVssBl$   every 8 hours. Close monitoring. Tolerating well.  Oxycodone $RemoveBe'5mg'RkpBORavI$  as needed for breakthrough pain. Pharmacy called 5/31 sharing that $RemoveBefo'5mg'YWQvoaHuJLu$  is on backorder. Request for new RX for 10 mg and patient will take 0.5 tablets.  Miralax daily for constipation Lexapro daily-tolerating well. Increase dose to $Remov'10mg'tRVEju$  daily. Will continue to monitor and adjust accordingly. Zofran as needed for nausea as prescribed.  I will plan to see patient back in 2-4 weeks in collaboration to other oncology appointments.    Patient expressed understanding and was in agreement with this plan. He also understands that He can call the clinic at any time with any questions, concerns, or complaints.   Thank you for your referral and allowing Palliative to assist in Mr.  Belva Crome Bourne's care.    Billing based on MDM: High   Problems Addressed: One acute or chronic illness or injury with exacerbation, progression, cancer, and pain.    Amount and/or Complexity of Data: Category 3:Discussion of management or test interpretation with external physician/other qualified health care professional/appropriate source (not separately reported). Any controlled substances utilized were prescribed in the context of palliative care.   Risks: Controlled substances.   Time Total: 25 min   Visit consisted of counseling and education dealing with the complex and emotionally intense issues of symptom management and palliative care in the setting of serious and potentially life-threatening illness.Greater than 50%  of this time was spent counseling and coordinating care related to the above assessment and plan.  Alda Lea, AGPCNP-BC  Palliative Medicine Team/[place]

## 2022-03-30 ENCOUNTER — Other Ambulatory Visit (HOSPITAL_COMMUNITY): Payer: Self-pay

## 2022-04-01 ENCOUNTER — Other Ambulatory Visit (HOSPITAL_COMMUNITY): Payer: Self-pay

## 2022-04-01 ENCOUNTER — Other Ambulatory Visit: Payer: Self-pay

## 2022-04-01 MED ORDER — ONDANSETRON HCL 8 MG PO TABS
8.0000 mg | ORAL_TABLET | Freq: Three times a day (TID) | ORAL | 0 refills | Status: DC | PRN
  Filled 2022-04-01: qty 30, 10d supply, fill #0

## 2022-04-01 MED ORDER — PROCHLORPERAZINE MALEATE 10 MG PO TABS
10.0000 mg | ORAL_TABLET | Freq: Four times a day (QID) | ORAL | 0 refills | Status: DC | PRN
  Filled 2022-04-01: qty 30, 8d supply, fill #0

## 2022-04-01 NOTE — Progress Notes (Signed)
Pt wife called requesting a refill lon zofran. Also reports pt is having significant vomiting, compazine also order. Wife verbalized understanding to alternate between the two to give the pt better coverage. Pt also reports that he has blisters on his hands, feet, and forearms. Lexine Baton, NP aware and family to send picture on mychart today. Verbalized understanding, no further needs at this time.

## 2022-04-02 ENCOUNTER — Other Ambulatory Visit (HOSPITAL_COMMUNITY): Payer: Self-pay

## 2022-04-02 MED ORDER — ENTRESTO 24-26 MG PO TABS: 1.0000 | ORAL_TABLET | Freq: Two times a day (BID) | ORAL | 5 refills | Status: DC

## 2022-04-06 ENCOUNTER — Inpatient Hospital Stay: Payer: Medicaid Other

## 2022-04-06 ENCOUNTER — Other Ambulatory Visit: Payer: Self-pay

## 2022-04-06 ENCOUNTER — Inpatient Hospital Stay (HOSPITAL_BASED_OUTPATIENT_CLINIC_OR_DEPARTMENT_OTHER): Payer: Medicaid Other | Admitting: Hematology and Oncology

## 2022-04-06 ENCOUNTER — Other Ambulatory Visit (HOSPITAL_COMMUNITY): Payer: Self-pay

## 2022-04-06 ENCOUNTER — Inpatient Hospital Stay (HOSPITAL_BASED_OUTPATIENT_CLINIC_OR_DEPARTMENT_OTHER): Payer: Medicaid Other | Admitting: Nurse Practitioner

## 2022-04-06 ENCOUNTER — Encounter: Payer: Self-pay | Admitting: Nurse Practitioner

## 2022-04-06 VITALS — BP 119/72 | HR 60 | Temp 97.9°F | Resp 15 | Wt 129.6 lb

## 2022-04-06 DIAGNOSIS — F419 Anxiety disorder, unspecified: Secondary | ICD-10-CM | POA: Diagnosis not present

## 2022-04-06 DIAGNOSIS — R630 Anorexia: Secondary | ICD-10-CM | POA: Diagnosis not present

## 2022-04-06 DIAGNOSIS — R530 Neoplastic (malignant) related fatigue: Secondary | ICD-10-CM | POA: Diagnosis not present

## 2022-04-06 DIAGNOSIS — R634 Abnormal weight loss: Secondary | ICD-10-CM | POA: Diagnosis not present

## 2022-04-06 DIAGNOSIS — K5900 Constipation, unspecified: Secondary | ICD-10-CM | POA: Diagnosis not present

## 2022-04-06 DIAGNOSIS — R110 Nausea: Secondary | ICD-10-CM | POA: Diagnosis not present

## 2022-04-06 DIAGNOSIS — C880 Waldenstrom macroglobulinemia: Secondary | ICD-10-CM

## 2022-04-06 DIAGNOSIS — T402X5A Adverse effect of other opioids, initial encounter: Secondary | ICD-10-CM | POA: Diagnosis not present

## 2022-04-06 DIAGNOSIS — B084 Enteroviral vesicular stomatitis with exanthem: Secondary | ICD-10-CM | POA: Diagnosis not present

## 2022-04-06 DIAGNOSIS — I110 Hypertensive heart disease with heart failure: Secondary | ICD-10-CM | POA: Diagnosis not present

## 2022-04-06 DIAGNOSIS — Z515 Encounter for palliative care: Secondary | ICD-10-CM

## 2022-04-06 DIAGNOSIS — R590 Localized enlarged lymph nodes: Secondary | ICD-10-CM | POA: Diagnosis not present

## 2022-04-06 DIAGNOSIS — G893 Neoplasm related pain (acute) (chronic): Secondary | ICD-10-CM

## 2022-04-06 DIAGNOSIS — D509 Iron deficiency anemia, unspecified: Secondary | ICD-10-CM | POA: Diagnosis not present

## 2022-04-06 DIAGNOSIS — M7989 Other specified soft tissue disorders: Secondary | ICD-10-CM | POA: Diagnosis not present

## 2022-04-06 DIAGNOSIS — K5903 Drug induced constipation: Secondary | ICD-10-CM | POA: Diagnosis not present

## 2022-04-06 DIAGNOSIS — D472 Monoclonal gammopathy: Secondary | ICD-10-CM | POA: Diagnosis not present

## 2022-04-06 DIAGNOSIS — Z79620 Long term (current) use of immunosuppressive biologic: Secondary | ICD-10-CM | POA: Diagnosis not present

## 2022-04-06 DIAGNOSIS — I5022 Chronic systolic (congestive) heart failure: Secondary | ICD-10-CM | POA: Diagnosis not present

## 2022-04-06 LAB — CBC WITH DIFFERENTIAL (CANCER CENTER ONLY)
Abs Immature Granulocytes: 0.01 10*3/uL (ref 0.00–0.07)
Basophils Absolute: 0 10*3/uL (ref 0.0–0.1)
Basophils Relative: 0 %
Eosinophils Absolute: 0.3 10*3/uL (ref 0.0–0.5)
Eosinophils Relative: 7 %
HCT: 41.6 % (ref 39.0–52.0)
Hemoglobin: 13.8 g/dL (ref 13.0–17.0)
Immature Granulocytes: 0 %
Lymphocytes Relative: 24 %
Lymphs Abs: 1.2 10*3/uL (ref 0.7–4.0)
MCH: 27.4 pg (ref 26.0–34.0)
MCHC: 33.2 g/dL (ref 30.0–36.0)
MCV: 82.7 fL (ref 80.0–100.0)
Monocytes Absolute: 0.4 10*3/uL (ref 0.1–1.0)
Monocytes Relative: 8 %
Neutro Abs: 3.1 10*3/uL (ref 1.7–7.7)
Neutrophils Relative %: 61 %
Platelet Count: 180 10*3/uL (ref 150–400)
RBC: 5.03 MIL/uL (ref 4.22–5.81)
RDW: 14.6 % (ref 11.5–15.5)
WBC Count: 5.2 10*3/uL (ref 4.0–10.5)
nRBC: 0 % (ref 0.0–0.2)

## 2022-04-06 LAB — LACTATE DEHYDROGENASE: LDH: 109 U/L (ref 98–192)

## 2022-04-06 LAB — CMP (CANCER CENTER ONLY)
ALT: 8 U/L (ref 0–44)
AST: 15 U/L (ref 15–41)
Albumin: 3.8 g/dL (ref 3.5–5.0)
Alkaline Phosphatase: 70 U/L (ref 38–126)
Anion gap: 5 (ref 5–15)
BUN: 9 mg/dL (ref 8–23)
CO2: 26 mmol/L (ref 22–32)
Calcium: 8.7 mg/dL — ABNORMAL LOW (ref 8.9–10.3)
Chloride: 104 mmol/L (ref 98–111)
Creatinine: 0.86 mg/dL (ref 0.61–1.24)
GFR, Estimated: >60 mL/min (ref >60)
Glucose, Bld: 84 mg/dL (ref 70–99)
Potassium: 3.6 mmol/L (ref 3.5–5.1)
Sodium: 135 mmol/L (ref 135–145)
Total Bilirubin: 0.4 mg/dL (ref 0.3–1.2)
Total Protein: 5.6 g/dL — ABNORMAL LOW (ref 6.5–8.1)

## 2022-04-06 MED ORDER — DRONABINOL 2.5 MG PO CAPS
2.5000 mg | ORAL_CAPSULE | Freq: Two times a day (BID) | ORAL | 0 refills | Status: DC
  Filled 2022-04-06: qty 60, 30d supply, fill #0

## 2022-04-06 MED ORDER — OXYCODONE HCL 10 MG PO TABS
10.0000 mg | ORAL_TABLET | Freq: Four times a day (QID) | ORAL | 0 refills | Status: DC | PRN
  Filled 2022-04-06: qty 60, 15d supply, fill #0

## 2022-04-07 ENCOUNTER — Other Ambulatory Visit (HOSPITAL_COMMUNITY): Payer: Self-pay

## 2022-04-07 LAB — KAPPA/LAMBDA LIGHT CHAINS
Kappa free light chain: 30.8 mg/L — ABNORMAL HIGH (ref 3.3–19.4)
Kappa, lambda light chain ratio: 7.33 — ABNORMAL HIGH (ref 0.26–1.65)
Lambda free light chains: 4.2 mg/L — ABNORMAL LOW (ref 5.7–26.3)

## 2022-04-08 ENCOUNTER — Other Ambulatory Visit (HOSPITAL_COMMUNITY): Payer: Self-pay

## 2022-04-08 ENCOUNTER — Other Ambulatory Visit: Payer: Self-pay | Admitting: Nurse Practitioner

## 2022-04-08 ENCOUNTER — Encounter: Payer: Self-pay | Admitting: Nurse Practitioner

## 2022-04-08 ENCOUNTER — Inpatient Hospital Stay (HOSPITAL_BASED_OUTPATIENT_CLINIC_OR_DEPARTMENT_OTHER): Payer: Medicaid Other | Admitting: Nurse Practitioner

## 2022-04-08 DIAGNOSIS — D472 Monoclonal gammopathy: Secondary | ICD-10-CM | POA: Diagnosis not present

## 2022-04-08 DIAGNOSIS — G893 Neoplasm related pain (acute) (chronic): Secondary | ICD-10-CM

## 2022-04-08 DIAGNOSIS — I110 Hypertensive heart disease with heart failure: Secondary | ICD-10-CM | POA: Diagnosis not present

## 2022-04-08 DIAGNOSIS — F419 Anxiety disorder, unspecified: Secondary | ICD-10-CM

## 2022-04-08 DIAGNOSIS — Z515 Encounter for palliative care: Secondary | ICD-10-CM | POA: Diagnosis not present

## 2022-04-08 DIAGNOSIS — I5022 Chronic systolic (congestive) heart failure: Secondary | ICD-10-CM | POA: Diagnosis not present

## 2022-04-08 DIAGNOSIS — T402X5A Adverse effect of other opioids, initial encounter: Secondary | ICD-10-CM | POA: Diagnosis not present

## 2022-04-08 DIAGNOSIS — K5903 Drug induced constipation: Secondary | ICD-10-CM

## 2022-04-08 DIAGNOSIS — M7989 Other specified soft tissue disorders: Secondary | ICD-10-CM | POA: Diagnosis not present

## 2022-04-08 DIAGNOSIS — R590 Localized enlarged lymph nodes: Secondary | ICD-10-CM | POA: Diagnosis not present

## 2022-04-08 DIAGNOSIS — Z79620 Long term (current) use of immunosuppressive biologic: Secondary | ICD-10-CM | POA: Diagnosis not present

## 2022-04-08 DIAGNOSIS — R630 Anorexia: Secondary | ICD-10-CM

## 2022-04-08 DIAGNOSIS — D509 Iron deficiency anemia, unspecified: Secondary | ICD-10-CM | POA: Diagnosis not present

## 2022-04-08 DIAGNOSIS — C880 Waldenstrom macroglobulinemia: Secondary | ICD-10-CM | POA: Diagnosis not present

## 2022-04-08 DIAGNOSIS — B084 Enteroviral vesicular stomatitis with exanthem: Secondary | ICD-10-CM | POA: Diagnosis not present

## 2022-04-08 MED ORDER — METHADONE HCL 10 MG PO TABS
10.0000 mg | ORAL_TABLET | Freq: Three times a day (TID) | ORAL | 0 refills | Status: DC
  Filled 2022-04-08: qty 90, 30d supply, fill #0

## 2022-04-08 NOTE — Progress Notes (Signed)
Connor Solomon  Telephone:(336) [phone] Fax:(336) [phone]   Name: Connor Solomon Date: 04/08/2022 MRN: [id number]  DOB: 1961/01/01  Patient Care Team: Delene Ruffini, MD as PCP - General (Internal Medicine) Pickenpack-Cousar, Carlena Sax, NP as Nurse Practitioner (Nurse Practitioner)   I connected with Connor Solomon on 04/08/22 at 11:30 AM EDT by phone and verified that I am speaking with the correct person using two identifiers.   I discussed the limitations, risks, security and privacy concerns of performing an evaluation and management service by telemedicine and the availability of in-person appointments. I also discussed with the patient that there may be a patient responsible charge related to this service. The patient expressed understanding and agreed to proceed.   Other persons participating in the visit and their role in the encounter: Maygan, RN    Patient's location: Home   Provider's location: [place]    Chief Complaint: Symptom Management    REASON FOR CONSULTATION: Connor Solomon is a 61 y.o. 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 Palliative ask to see for symptom management.  SOCIAL HISTORY:     reports that he quit smoking about 17 months ago. His smoking use included cigars. He has never used smokeless tobacco. He reports that he does not drink alcohol and does not use drugs.  ADVANCE DIRECTIVES:    CODE STATUS:   PAST MEDICAL HISTORY: Past Medical History:  Diagnosis Date   Acute heart failure ([place]) 04/08/2021   Acute HFrEF (heart failure with reduced ejection fraction) ([place]) 04/07/2021   Arthritis    hands, elbows bilaterally   Cancer (HCC)    Full dentures    Hypertension    Pt states he doen not have HTN and has never been treated for HTN   Kidney stone on left side last stone 4-5 yrs ago   recurrent (3  episodes)   Panic disorder    pt states has resolved   Syncope    resolved- was related to panic attacks    PAST SURGICAL HISTORY:  Past Surgical History:  Procedure Laterality Date   CARPAL METACARPAL FUSION WITH DISTAL RADIAL BONE GRAFT Left 05/30/2013   Procedure: LEFT THUMB METACARPAL JOINT FUSION;  Surgeon: Schuyler Amor, MD;  Location: [place];  Service: Orthopedics;  Laterality: Left;   ESOPHAGOGASTRODUODENOSCOPY N/A 02/12/2014   Procedure: ESOPHAGOGASTRODUODENOSCOPY (EGD);  Surgeon: Jerene Bears, MD;  Location: [facility] ENDOSCOPY;  Service: Endoscopy;  Laterality: N/A;   FOREIGN BODY REMOVAL N/A 02/12/2014   Procedure: FOREIGN BODY REMOVAL;  Surgeon: Jerene Bears, MD;  Location: [place];  Service: Endoscopy;  Laterality: N/A;   OPEN REDUCTION INTERNAL FIXATION (ORIF) DISTAL RADIAL FRACTURE Left 11/29/2012   Procedure: OPEN REDUCTION INTERNAL FIXATION (ORIF) DISTAL RADIAL FRACTURE;  Surgeon: Schuyler Amor, MD;  Location: [place];  Service: Orthopedics;  Laterality: Left;  LEFT DISTAL RADIUS OSTEOTOMY WITH BONE GRAFT   RIGHT/LEFT HEART CATH AND CORONARY ANGIOGRAPHY N/A 04/09/2021   Procedure: RIGHT/LEFT HEART CATH AND CORONARY ANGIOGRAPHY;  Surgeon: Jolaine Artist, MD;  Location: [place] CV LAB;  Service: Cardiovascular;  Laterality: N/A;   TENDON REPAIR Left 02/07/2013   Procedure: LEFT EXTENSOR INDICIS PROPRIUS  TO EXTENSOR POLLICIS LONGUS TENDON TRANSFER;  Surgeon: Schuyler Amor, MD;  Location: [place];  Service: Orthopedics;  Laterality: Left;   WISDOM TOOTH EXTRACTION  WRIST SURGERY     fx only    HEMATOLOGY/ONCOLOGY HISTORY:  Oncology History  Waldenstrom macroglobulinemia ([place])  05/24/2021 Initial Diagnosis   Waldenstrom macroglobulinemia ([place])   06/05/2021 -  Chemotherapy   Patient is on Treatment Plan : NON-HODGKINS LYMPHOMA Rituximab q7d       ALLERGIES:  is allergic to rituxan [rituximab] and  hydrocodone.  MEDICATIONS:  Current Outpatient Medications  Medication Sig Dispense Refill   acetaminophen (TYLENOL) 500 MG tablet Take 500 mg by mouth every 6 (six) hours as needed.     albuterol (VENTOLIN HFA) 108 (90 Base) MCG/ACT inhaler Inhale 2 puffs into the lungs every 6 hours as needed for wheezing or shortness of breath 18 g 0   carvedilol (COREG) 3.125 MG tablet Take 1 tablet by mouth 2 times daily. 68 tablet 2   dronabinol (MARINOL) 2.5 MG capsule Take 1 capsule by mouth 2 times daily before a meal. 60 capsule 0   empagliflozin (JARDIANCE) 10 MG TABS tablet Take 1 tablet by mouth daily before breakfast. 30 tablet 11   eplerenone (INSPRA) 25 MG tablet Take 1/2 tablet (12.5 mg total) by mouth daily. 15 tablet 11   escitalopram (LEXAPRO) 5 MG tablet Take 1 tablet (5 mg total) by mouth daily. 30 tablet 0   ferrous sulfate 325 (65 FE) MG tablet Take 1 tablet by mouth daily with breakfast. Please take with a source of Vitamin C 90 tablet 3   furosemide (LASIX) 20 MG tablet Take 1 tablet (20 mg total) by mouth daily as needed for swelling 100 tablet 2   ibrutinib (IMBRUVICA) 420 MG tablet Take 1 tablet (420 mg total) by mouth daily. 30 tablet 2   methadone (DOLOPHINE) 10 MG tablet Take 1 tablet by mouth every 8 hours. 90 tablet 0   metoCLOPramide (REGLAN) 10 MG tablet Take 1 tablet (10 mg total) by mouth every 8 (eight) hours as needed for nausea. 30 tablet 1   naloxone (NARCAN) nasal spray 4 mg/0.1 mL Take for overdose 1 each 0   naloxone (NARCAN) nasal spray 4 mg/0.1 mL Take for overdose as directed 2 each 0   ondansetron (ZOFRAN) 8 MG tablet Take 1 tablet by mouth every 8 hours as needed. 30 tablet 0   Oxycodone HCl 10 MG TABS Take 1 tablet (10 mg total) by mouth every 6 (six) hours as needed. 60 tablet 0   potassium chloride SA (KLOR-CON M) 20 MEQ tablet Take 1 tablet (20 mEq total) by mouth daily. 10 tablet 0   prochlorperazine (COMPAZINE) 10 MG tablet Take 1 tablet (10 mg total) by  mouth every 6 (six) hours as needed for nausea or vomiting. 30 tablet 0   sacubitril-valsartan (ENTRESTO) 24-26 MG Take 1 tablet by mouth 2 (two) times daily. 60 tablet 5   No current facility-administered medications for this visit.    VITAL SIGNS: There were no vitals taken for this visit. There were no vitals filed for this visit.   Estimated body mass index is 19.71 kg/m as calculated from the following:   Height as of 01/28/22: $RemoveBef'5\' 8"'SumiJJKEZH$  (1.727 m).   Weight as of 04/06/22: 129 lb 9.6 oz (58.8 kg).   PERFORMANCE STATUS (ECOG) : 2 - Symptomatic, <50% confined to bed   IMPRESSION:  I connected with Connor Solomon for close symptom management follow-up. He is appreciative of support and reports he is feeling much better. His blisters on his hands and feet are healing. Some peeling noted. Discussed keeping areas clean  and moisturized.   Neoplasm related pain Pain is better controlled. He is tolerating Methadone 10mg  three times daily. Denies drowsiness.   We will plan to continue close monitoring and adjust as needed.   Constipation Reports daily or every other day bowel movements. Education provided on opioid induced constipation. Recommended Miralax daily. If no bowel movement in 48 hours he knows to increase to twice daily.   Appetite/decreased appetite.  Feels Marinol is helping his appetite. States he has more of a desire to eat and able to eat more in quantity   Appetite waxes and wanes depending on the day and if he is experiencing nausea. Is no longer taking the olanzapine as he felt it was not working as expected. He endorses drinking 1-2 Ensure daily. Encouraged to continue.   Weight continues to slowly decrease. Today's weight is 129lbs down from 134 lbs  6/7, 139lbs 5/16 up from 131lbs 4/20, down from 147.8lbs on 3/22.   Education provided on eating small frequent meals versus focusing on 3 large meals per day. Protein enriched foots. Eating when he has a desire. Denies  nausea/vomiting. ll continue to closely monitor.   He has an appetite at times. Other times when he has a desire once he begins eating interest diminishes. Education provided on appetite stimulant.   We will continue to closely monitor.   4. Anxiety/Anxiousness  Tolerating well. Effective.   Connor Solomon was started on escitalopram during office visit on 5/31. Dose was increased to 10mg  on 6/14. Tolerating well. Education provided on tolerance and noticeable change which may take several days/weeks. Encouraged to continue with medication as prescribed allowing ongoing monitoring and adjustments as needed. He verbalized understanding and appreciation.   Connor Solomon shares concerns with ongoing anxiety and anxiousness. States his mind sometimes wanders making him have increased anxiety thinking about the unknown and what ifs. Emotional support provided. He took one of his relative's Xanax and felt this helped him tremendously. Education provided on importance of not taking medications not prescribed as this could interfere with other medications or cause complications. We also discussed concerns with certain medications which could contribute to respiratory depression, lethargy in correlation with his pain medications.   He is emotional regarding his anxiety. Wife expresses concerns over this as well. Education provided on use of more long-term medication which can provide similar relief as Xanax. We will plan to start on Lexapro daily. Education provided on side effects in addition to importance on not stopping abruptly and taking as prescribed. He understands it may take some time for effectiveness and will not be an immediate response.   We will closely monitor.   5. Goals of care (5/16) Connor Solomon shares his realistic understanding of his condition. He and his wife are making sure all "affairs" are in order as his biggest worry is leaving his wife in a financial constraint. Reports communicating with his life  insurance policy etc. Emotional support provided.   He is emotional expressing racing thoughts during idol time or interruptions in his sleep as his mind wonders thinking about worst case scenario. Is requesting something to assist with sleeping and anxiety. Acknowledged his request. Education provided on limited use in collaboration with other medications including zyprexa. He verbalized understanding and aware  we will continue to support and monitor for needs.    PLAN: methadone 10 mg every 8 hours. Close monitoring. Tolerating well. Pain improved.  Oxycodone 10 mg as needed for breakthrough pain.  Miralax daily for constipation Lexapro 10 mg  daily-tolerating well. Will continue to monitor and adjust accordingly. Zofran as needed for nausea as prescribed.  Marinol 2.5 mg twice daily for appetite. Tolerating well. Appetite improved.  I will plan to see patient back in 2-4 weeks in collaboration to other oncology appointments.    Patient expressed understanding and was in agreement with this plan. He also understands that He can call the clinic at any time with any questions, concerns, or complaints.   Thank you for your referral and allowing Palliative to assist in Connor Solomon [facility] care.     Problems Addressed: One acute or chronic illness or injury with exacerbation, progression, cancer, and pain.    Amount and/or Complexity of Data: Category 3:Discussion of management or test interpretation with external physician/other qualified health care professional/appropriate source (not separately reported). Any controlled substances utilized were prescribed in the context of palliative care.   Time Total: 20 min .  Visit consisted of counseling and education dealing with the complex and emotionally intense issues of symptom management and palliative care in the setting of serious and potentially life-threatening illness.Greater than 50%  of this time was spent counseling and coordinating  care related to the above assessment and plan.  Alda Lea, AGPCNP-BC  Palliative Medicine Team/[place]

## 2022-04-09 ENCOUNTER — Ambulatory Visit: Payer: Medicaid Other | Admitting: Hematology and Oncology

## 2022-04-09 ENCOUNTER — Other Ambulatory Visit: Payer: Medicaid Other

## 2022-04-12 ENCOUNTER — Other Ambulatory Visit: Payer: Self-pay

## 2022-04-12 ENCOUNTER — Other Ambulatory Visit: Payer: Self-pay | Admitting: Internal Medicine

## 2022-04-12 ENCOUNTER — Other Ambulatory Visit (HOSPITAL_COMMUNITY): Payer: Self-pay

## 2022-04-12 MED ORDER — ESCITALOPRAM OXALATE 5 MG PO TABS: 5.0000 mg | ORAL_TABLET | Freq: Every day | ORAL | 0 refills | Status: DC

## 2022-04-12 NOTE — Telephone Encounter (Signed)
Refill for lexapro sent into [facility]

## 2022-04-13 LAB — MULTIPLE MYELOMA PANEL, SERUM
Albumin SerPl Elph-Mcnc: 3.3 g/dL (ref 2.9–4.4)
Albumin/Glob SerPl: 1.9 — ABNORMAL HIGH (ref 0.7–1.7)
Alpha 1: 0.3 g/dL (ref 0.0–0.4)
Alpha2 Glob SerPl Elph-Mcnc: 0.7 g/dL (ref 0.4–1.0)
B-Globulin SerPl Elph-Mcnc: 0.7 g/dL (ref 0.7–1.3)
Gamma Glob SerPl Elph-Mcnc: 0.2 g/dL — ABNORMAL LOW (ref 0.4–1.8)
Globulin, Total: 1.8 g/dL — ABNORMAL LOW (ref 2.2–3.9)
IgA: <5 mg/dL — ABNORMAL LOW (ref 61–437)
IgG (Immunoglobin G), Serum: 81 mg/dL — ABNORMAL LOW (ref 603–1613)
IgM (Immunoglobulin M), Srm: 108 mg/dL (ref 20–172)
M Protein SerPl Elph-Mcnc: 0.1 g/dL — ABNORMAL HIGH
Total Protein ELP: 5.1 g/dL — ABNORMAL LOW (ref 6.0–8.5)

## 2022-04-14 ENCOUNTER — Other Ambulatory Visit: Payer: Self-pay

## 2022-04-14 ENCOUNTER — Other Ambulatory Visit (HOSPITAL_COMMUNITY): Payer: Self-pay

## 2022-04-14 ENCOUNTER — Encounter: Payer: Self-pay | Admitting: Hematology and Oncology

## 2022-04-14 MED ORDER — ALBUTEROL SULFATE HFA 108 (90 BASE) MCG/ACT IN AERS
INHALATION_SPRAY | RESPIRATORY_TRACT | 2 refills | Status: DC
  Filled 2022-04-14: qty 18, 25d supply, fill #0
  Filled 2022-05-11: qty 6.7, 25d supply, fill #0
  Filled 2022-05-13: qty 18, 25d supply, fill #0
  Filled 2022-05-17 – 2022-08-05 (×3): qty 6.7, 25d supply, fill #0
  Filled 2022-09-13: qty 6.7, 25d supply, fill #1
  Filled 2022-10-07: qty 6.7, 25d supply, fill #2

## 2022-04-14 MED ORDER — IBRUTINIB 420 MG PO TABS: 420.0000 mg | ORAL_TABLET | Freq: Every day | ORAL | 2 refills | Status: DC

## 2022-04-14 NOTE — Telephone Encounter (Signed)
[facility] called to get a refill on [name].  Refilled the imbruvica although it said he had two refills. [facility] mailed out refills on 4/21, 5/14, and 6/7 so he was out of refills.  Gardiner Rhyme, RN

## 2022-04-22 ENCOUNTER — Inpatient Hospital Stay: Payer: Self-pay | Admitting: Nurse Practitioner

## 2022-04-23 ENCOUNTER — Encounter: Payer: Self-pay | Admitting: Hematology and Oncology

## 2022-04-23 ENCOUNTER — Other Ambulatory Visit (HOSPITAL_COMMUNITY): Payer: Self-pay

## 2022-04-23 ENCOUNTER — Other Ambulatory Visit: Payer: Self-pay | Admitting: Nurse Practitioner

## 2022-04-23 MED ORDER — OXYCODONE HCL 10 MG PO TABS
10.0000 mg | ORAL_TABLET | Freq: Four times a day (QID) | ORAL | 0 refills | Status: DC | PRN
  Filled 2022-04-23: qty 60, 15d supply, fill #0

## 2022-04-24 ENCOUNTER — Other Ambulatory Visit (HOSPITAL_COMMUNITY): Payer: Self-pay

## 2022-04-24 ENCOUNTER — Encounter: Payer: Self-pay | Admitting: Hematology and Oncology

## 2022-04-27 ENCOUNTER — Encounter: Payer: Self-pay | Admitting: Hematology and Oncology

## 2022-04-29 ENCOUNTER — Other Ambulatory Visit: Payer: Self-pay

## 2022-04-29 MED ORDER — ESCITALOPRAM OXALATE 5 MG PO TABS: 5.0000 mg | ORAL_TABLET | Freq: Every day | ORAL | 0 refills | Status: DC

## 2022-04-29 NOTE — Progress Notes (Signed)
Refill for lexapro sent, refill date adjusted

## 2022-05-03 ENCOUNTER — Other Ambulatory Visit: Payer: Self-pay | Admitting: Physician Assistant

## 2022-05-03 DIAGNOSIS — C880 Waldenstrom macroglobulinemia: Secondary | ICD-10-CM

## 2022-05-04 ENCOUNTER — Encounter: Payer: Medicaid Other | Admitting: Student

## 2022-05-04 ENCOUNTER — Other Ambulatory Visit (HOSPITAL_COMMUNITY): Payer: Self-pay

## 2022-05-04 ENCOUNTER — Telehealth: Payer: Self-pay | Admitting: Licensed Clinical Social Worker

## 2022-05-04 ENCOUNTER — Encounter: Payer: Self-pay | Admitting: Hematology and Oncology

## 2022-05-04 ENCOUNTER — Encounter: Payer: Self-pay | Admitting: Licensed Clinical Social Worker

## 2022-05-04 ENCOUNTER — Inpatient Hospital Stay (HOSPITAL_BASED_OUTPATIENT_CLINIC_OR_DEPARTMENT_OTHER): Payer: Self-pay | Admitting: Physician Assistant

## 2022-05-04 ENCOUNTER — Inpatient Hospital Stay: Payer: Self-pay | Attending: Hematology and Oncology

## 2022-05-04 ENCOUNTER — Other Ambulatory Visit: Payer: Self-pay

## 2022-05-04 ENCOUNTER — Encounter: Payer: Self-pay | Admitting: Nurse Practitioner

## 2022-05-04 ENCOUNTER — Inpatient Hospital Stay (HOSPITAL_BASED_OUTPATIENT_CLINIC_OR_DEPARTMENT_OTHER): Payer: Self-pay | Admitting: Nurse Practitioner

## 2022-05-04 VITALS — BP 128/71 | HR 60 | Temp 97.8°F | Resp 18 | Ht 68.0 in | Wt 130.2 lb

## 2022-05-04 DIAGNOSIS — C880 Waldenstrom macroglobulinemia: Secondary | ICD-10-CM

## 2022-05-04 DIAGNOSIS — Z885 Allergy status to narcotic agent status: Secondary | ICD-10-CM | POA: Insufficient documentation

## 2022-05-04 DIAGNOSIS — Z8249 Family history of ischemic heart disease and other diseases of the circulatory system: Secondary | ICD-10-CM | POA: Insufficient documentation

## 2022-05-04 DIAGNOSIS — R112 Nausea with vomiting, unspecified: Secondary | ICD-10-CM | POA: Insufficient documentation

## 2022-05-04 DIAGNOSIS — Z5986 Financial insecurity: Secondary | ICD-10-CM | POA: Insufficient documentation

## 2022-05-04 DIAGNOSIS — D472 Monoclonal gammopathy: Secondary | ICD-10-CM | POA: Insufficient documentation

## 2022-05-04 DIAGNOSIS — G893 Neoplasm related pain (acute) (chronic): Secondary | ICD-10-CM

## 2022-05-04 DIAGNOSIS — R590 Localized enlarged lymph nodes: Secondary | ICD-10-CM | POA: Insufficient documentation

## 2022-05-04 DIAGNOSIS — Z87891 Personal history of nicotine dependence: Secondary | ICD-10-CM | POA: Insufficient documentation

## 2022-05-04 DIAGNOSIS — M7989 Other specified soft tissue disorders: Secondary | ICD-10-CM | POA: Insufficient documentation

## 2022-05-04 DIAGNOSIS — I5022 Chronic systolic (congestive) heart failure: Secondary | ICD-10-CM | POA: Insufficient documentation

## 2022-05-04 DIAGNOSIS — Z515 Encounter for palliative care: Secondary | ICD-10-CM

## 2022-05-04 DIAGNOSIS — Z5941 Food insecurity: Secondary | ICD-10-CM | POA: Insufficient documentation

## 2022-05-04 DIAGNOSIS — K5903 Drug induced constipation: Secondary | ICD-10-CM

## 2022-05-04 DIAGNOSIS — Z79899 Other long term (current) drug therapy: Secondary | ICD-10-CM | POA: Insufficient documentation

## 2022-05-04 LAB — CBC WITH DIFFERENTIAL (CANCER CENTER ONLY)
Abs Immature Granulocytes: 0.01 10*3/uL (ref 0.00–0.07)
Basophils Absolute: 0.1 10*3/uL (ref 0.0–0.1)
Basophils Relative: 1 %
Eosinophils Absolute: 0.6 10*3/uL — ABNORMAL HIGH (ref 0.0–0.5)
Eosinophils Relative: 11 %
HCT: 40.1 % (ref 39.0–52.0)
Hemoglobin: 13.2 g/dL (ref 13.0–17.0)
Immature Granulocytes: 0 %
Lymphocytes Relative: 33 %
Lymphs Abs: 1.8 10*3/uL (ref 0.7–4.0)
MCH: 27.4 pg (ref 26.0–34.0)
MCHC: 32.9 g/dL (ref 30.0–36.0)
MCV: 83.4 fL (ref 80.0–100.0)
Monocytes Absolute: 0.4 10*3/uL (ref 0.1–1.0)
Monocytes Relative: 8 %
Neutro Abs: 2.6 10*3/uL (ref 1.7–7.7)
Neutrophils Relative %: 47 %
Platelet Count: 202 10*3/uL (ref 150–400)
RBC: 4.81 MIL/uL (ref 4.22–5.81)
RDW: 14.7 % (ref 11.5–15.5)
WBC Count: 5.5 10*3/uL (ref 4.0–10.5)
nRBC: 0 % (ref 0.0–0.2)

## 2022-05-04 LAB — CMP (CANCER CENTER ONLY)
ALT: 8 U/L (ref 0–44)
AST: 17 U/L (ref 15–41)
Albumin: 3.8 g/dL (ref 3.5–5.0)
Alkaline Phosphatase: 67 U/L (ref 38–126)
Anion gap: 5 (ref 5–15)
BUN: 12 mg/dL (ref 8–23)
CO2: 26 mmol/L (ref 22–32)
Calcium: 8.5 mg/dL — ABNORMAL LOW (ref 8.9–10.3)
Chloride: 105 mmol/L (ref 98–111)
Creatinine: 0.87 mg/dL (ref 0.61–1.24)
GFR, Estimated: >60 mL/min (ref >60)
Glucose, Bld: 90 mg/dL (ref 70–99)
Potassium: 3.7 mmol/L (ref 3.5–5.1)
Sodium: 136 mmol/L (ref 135–145)
Total Bilirubin: 0.5 mg/dL (ref 0.3–1.2)
Total Protein: 5.4 g/dL — ABNORMAL LOW (ref 6.5–8.1)

## 2022-05-04 LAB — LACTATE DEHYDROGENASE: LDH: 119 U/L (ref 98–192)

## 2022-05-04 MED ORDER — METHADONE HCL 10 MG PO TABS
10.0000 mg | ORAL_TABLET | Freq: Three times a day (TID) | ORAL | 0 refills | Status: DC
  Filled 2022-05-07 – 2022-05-11 (×2): qty 90, 30d supply, fill #0

## 2022-05-04 MED ORDER — OXYCODONE HCL 10 MG PO TABS
10.0000 mg | ORAL_TABLET | Freq: Four times a day (QID) | ORAL | 0 refills | Status: DC | PRN
  Filled 2022-05-04 – 2022-05-08 (×2): qty 60, 15d supply, fill #0

## 2022-05-04 NOTE — Progress Notes (Signed)
[facility]  [phone]) [phone] Fax:(336) [phone]   Name: Connor Solomon Date: 05/04/2022 MRN: [id number]  DOB: [birth year]  Patient Care Team: Delene Ruffini, MD as PCP - General (Internal Medicine) Pickenpack-Cousar, Carlena Sax, NP as Nurse Practitioner (Nurse Practitioner)   I connected with [name] on 05/04/22 at  1:45 PM EDT by phone and verified that I am speaking with the correct person using two identifiers.   I discussed the limitations, risks, security and privacy concerns of performing an evaluation and management service by telemedicine and the availability of in-person appointments. I also discussed with the patient that there may be a patient responsible charge related to this service. The patient expressed understanding and agreed to proceed.   Other persons participating in the visit and their role in the encounter: [place], RN    Patient's location: Home   Provider's location: [place]    Chief Complaint: Symptom Management    REASON FOR CONSULTATION: Connor Solomon is a 61 y.o. 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 Palliative ask to see for symptom management.  SOCIAL HISTORY:     reports that he quit smoking about 18 months ago. His smoking use included cigars. He has never used smokeless tobacco. He reports that he does not drink alcohol and does not use drugs.  ADVANCE DIRECTIVES:    CODE STATUS:   PAST MEDICAL HISTORY: Past Medical History:  Diagnosis Date   Acute heart failure ([place]) 04/08/2021   Acute HFrEF (heart failure with reduced ejection fraction) ([place]) 04/07/2021   Arthritis    hands, elbows bilaterally   Cancer ([facility])    Full dentures    Hypertension    Pt states he doen not have HTN and has never been treated for HTN   Kidney stone on left side last stone 4-5 yrs ago   recurrent (3  episodes)   Panic disorder    pt states has resolved   Syncope    resolved- was related to panic attacks    PAST SURGICAL HISTORY:  Past Surgical History:  Procedure Laterality Date   CARPAL METACARPAL FUSION WITH DISTAL RADIAL BONE GRAFT Left 05/30/2013   Procedure: LEFT THUMB METACARPAL JOINT FUSION;  Surgeon: Schuyler Amor, MD;  Location: [facility];  Service: Orthopedics;  Laterality: Left;   ESOPHAGOGASTRODUODENOSCOPY N/A 02/12/2014   Procedure: ESOPHAGOGASTRODUODENOSCOPY (EGD);  Surgeon: Jerene Bears, MD;  Location: [facility];  Service: Endoscopy;  Laterality: N/A;   FOREIGN BODY REMOVAL N/A 02/12/2014   Procedure: FOREIGN BODY REMOVAL;  Surgeon: Jerene Bears, MD;  Location: [facility];  Service: Endoscopy;  Laterality: N/A;   OPEN REDUCTION INTERNAL FIXATION (ORIF) DISTAL RADIAL FRACTURE Left 11/29/2012   Procedure: OPEN REDUCTION INTERNAL FIXATION (ORIF) DISTAL RADIAL FRACTURE;  Surgeon: Schuyler Amor, MD;  Location: [facility];  Service: Orthopedics;  Laterality: Left;  LEFT DISTAL RADIUS OSTEOTOMY WITH BONE GRAFT   RIGHT/LEFT HEART CATH AND CORONARY ANGIOGRAPHY N/A 04/09/2021   Procedure: RIGHT/LEFT HEART CATH AND CORONARY ANGIOGRAPHY;  Surgeon: Jolaine Artist, MD;  Location: [facility];  Service: Cardiovascular;  Laterality: N/A;   TENDON REPAIR Left 02/07/2013   Procedure: LEFT EXTENSOR INDICIS PROPRIUS  TO EXTENSOR POLLICIS LONGUS TENDON TRANSFER;  Surgeon: Schuyler Amor, MD;  Location: [facility];  Service: Orthopedics;  Laterality: Left;   WISDOM TOOTH EXTRACTION  WRIST SURGERY     fx only    HEMATOLOGY/ONCOLOGY HISTORY:  Oncology History  Waldenstrom macroglobulinemia ([place])  05/24/2021 Initial Diagnosis   Waldenstrom macroglobulinemia ([place])   06/05/2021 -  Chemotherapy   Patient is on Treatment Plan : NON-HODGKINS LYMPHOMA Rituximab q7d       ALLERGIES:  is allergic to rituxan [rituximab] and  hydrocodone.  MEDICATIONS:  Current Outpatient Medications  Medication Sig Dispense Refill   acetaminophen (TYLENOL) 500 MG tablet Take 500 mg by mouth every 6 (six) hours as needed.     albuterol (VENTOLIN HFA) 108 (90 Base) MCG/ACT inhaler Inhale 2 puffs into the lungs every 6 hours as needed for wheezing or shortness of breath 18 g 2   carvedilol (COREG) 3.125 MG tablet Take 1 tablet by mouth 2 times daily. 68 tablet 2   dronabinol (MARINOL) 2.5 MG capsule Take 1 capsule by mouth 2 times daily before a meal. 60 capsule 0   empagliflozin (JARDIANCE) 10 MG TABS tablet Take 1 tablet by mouth daily before breakfast. 30 tablet 11   eplerenone (INSPRA) 25 MG tablet Take 1/2 tablet (12.5 mg total) by mouth daily. 15 tablet 11   [START ON 05/12/2022] escitalopram (LEXAPRO) 5 MG tablet Take 1 tablet (5 mg total) by mouth daily. 30 tablet 0   ferrous sulfate 325 (65 FE) MG tablet Take 1 tablet by mouth daily with breakfast. Please take with a source of Vitamin C 90 tablet 3   furosemide (LASIX) 20 MG tablet Take 1 tablet (20 mg total) by mouth daily as needed for swelling 100 tablet 2   ibrutinib (IMBRUVICA) 420 MG tablet Take 1 tablet (420 mg total) by mouth daily. 30 tablet 2   [START ON 05/07/2022] methadone (DOLOPHINE) 10 MG tablet Take 1 tablet by mouth every 8 hours. 90 tablet 0   metoCLOPramide (REGLAN) 10 MG tablet Take 1 tablet (10 mg total) by mouth every 8 (eight) hours as needed for nausea. 30 tablet 1   naloxone (NARCAN) nasal spray 4 mg/0.1 mL Take for overdose 1 each 0   naloxone (NARCAN) nasal spray 4 mg/0.1 mL Take for overdose as directed 2 each 0   ondansetron (ZOFRAN) 8 MG tablet Take 1 tablet by mouth every 8 hours as needed. 30 tablet 0   Oxycodone HCl 10 MG TABS Take 1 tablet by mouth every 6 hours as needed. 60 tablet 0   potassium chloride SA (KLOR-CON M) 20 MEQ tablet Take 1 tablet (20 mEq total) by mouth daily. 10 tablet 0   prochlorperazine (COMPAZINE) 10 MG tablet Take 1  tablet (10 mg total) by mouth every 6 (six) hours as needed for nausea or vomiting. 30 tablet 0   sacubitril-valsartan (ENTRESTO) 24-26 MG Take 1 tablet by mouth 2 (two) times daily. 60 tablet 5   No current facility-administered medications for this visit.    VITAL SIGNS: There were no vitals taken for this visit. There were no vitals filed for this visit.   Estimated body mass index is 19.8 kg/m as calculated from the following:   Height as of an earlier encounter on 05/04/22: $RemoveBef'5\' 8"'PrVJjaldyb$  (1.727 m).   Weight as of an earlier encounter on 05/04/22: 130 lb 3.2 oz (59.1 kg).   PERFORMANCE STATUS (ECOG) : 2 - Symptomatic, <50% confined to bed   IMPRESSION:  I connected with Mr. Michalski for close symptom management follow-up. He is appreciative of support and reports he is feeling much better. His blisters on his hands have  resolved. Some peeling to feet but healing. Discussed keeping areas clean and moisturized.   Neoplasm related pain Pain is better controlled. He is tolerating Methadone 10mg  three times daily. Denies drowsiness.   We will plan to continue close monitoring and adjust as needed.   Constipation Reports daily or every other day bowel movements. Education provided on opioid induced constipation. Recommended Miralax daily. If no bowel movement in 48 hours he knows to increase to twice daily.   Appetite/decreased appetite.  Feels Marinol is helping his appetite. States he has more of a desire to eat and able to eat more in quantity   Is trying to drink at least 1-2 ensure daily for additional support.   Weight  is stable at 130lbs. He was134 lbs  6/7, 139lbs 5/16 up from 131lbs 4/20, down from 147.8lbs on 3/22.   Education provided on eating small frequent meals versus focusing on 3 large meals per day. Protein enriched foots. Eating when he has a desire. Denies nausea/vomiting. ll continue to closely monitor.   He has an appetite at times. Other times when he has a desire  once he begins eating interest diminishes. Education provided on appetite stimulant.   We will continue to closely monitor.   4. Anxiety/Anxiousness  Tolerating escitalopram well. Effective.   Treylin was started on escitalopram during office visit on 5/31. Dose was increased to 10mg  on 6/14. Tolerating well. Encouraged to continue with medication as prescribed allowing ongoing monitoring and adjustments as needed. He verbalized understanding and appreciation.   Elder shares concerns with ongoing anxiety and anxiousness. States his mind sometimes wanders making him have increased anxiety thinking about the unknown and what ifs. Emotional support provided. He took one of his relative's Xanax and felt this helped him tremendously. Education provided on importance of not taking medications not prescribed as this could interfere with other medications or cause complications. We also discussed concerns with certain medications which could contribute to respiratory depression, lethargy in correlation with his pain medications.   He is emotional regarding his anxiety. Wife expresses concerns over this as well. Education provided on use of more long-term medication which can provide similar relief as Xanax. We will plan to start on Lexapro daily. Education provided on side effects in addition to importance on not stopping abruptly and taking as prescribed. He understands it may take some time for effectiveness and will not be an immediate response.   We will closely monitor.   5. Goals of care (5/16) Mr. Aburto shares his realistic understanding of his condition. He and his wife are making sure all "affairs" are in order as his biggest worry is leaving his wife in a financial constraint. Reports communicating with his life insurance policy etc. Emotional support provided.   He is emotional expressing racing thoughts during idol time or interruptions in his sleep as his mind wonders thinking about worst case  scenario. Is requesting something to assist with sleeping and anxiety. Acknowledged his request. Education provided on limited use in collaboration with other medications including zyprexa. He verbalized understanding and aware  we will continue to support and monitor for needs.    PLAN: methadone 10 mg every 8 hours. Close monitoring. Tolerating well. Pain improved.  Oxycodone 10 mg as needed for breakthrough pain.  Miralax daily for constipation Lexapro 10 mg daily-tolerating well. Will continue to monitor and adjust accordingly. Zofran as needed for nausea as prescribed.  Marinol 2.5 mg twice daily for appetite. Tolerating well. Appetite improved.  I  will plan to see patient back in 3-4 weeks in collaboration to other oncology appointments.    Patient expressed understanding and was in agreement with this plan. He also understands that He can call the clinic at any time with any questions, concerns, or complaints.   Thank you for your referral and allowing Palliative to assist in Mr. Sufyaan Palma [facility] care.   Any controlled substances utilized were prescribed in the context of palliative care. PDMP has been reviewed.    Time Total: 20 min.   Visit consisted of counseling and education dealing with the complex and emotionally intense issues of symptom management and palliative care in the setting of serious and potentially life-threatening illness.Greater than 50%  of this time was spent counseling and coordinating care related to the above assessment and plan.  Alda Lea, AGPCNP-BC  Palliative Medicine Team/[facility]

## 2022-05-04 NOTE — Telephone Encounter (Signed)
H&V Care Navigation CSW Progress Note  Clinical Social Worker  mailed patient assistance application for [name]  to patient as I received a message from [name] with Oncology SW team. Pt apparently had been enrolled in [facility] and this has lapsed- unclear why, [name] is checking on this. Pt in need of more Jardiance at the end of this week and unable to afford $400 copay. I have mailed him PAP from [facility] but may need samples/further assistance. I will route this to [name], Pharm Tech and [name], .   Patient is participating in a Managed Medicaid Plan:  No, previously had [facility] Medicaid, per Onc SW this has lapsed.   SDOH Screenings   Alcohol Screen: Not on file  Depression (PHQ2-9): Low Risk  (07/16/2021)   Depression (PHQ2-9)    PHQ-2 Score: 0  Recent Concern: Depression (PHQ2-9) - Medium Risk (06/03/2021)   Depression (PHQ2-9)    PHQ-2 Score: 17  Financial Resource Strain: High Risk (07/17/2021)   Overall Financial Resource Strain (CARDIA)    Difficulty of Paying Living Expenses: Very hard  Food Insecurity: Food Insecurity Present (04/10/2021)   Hunger Vital Sign    Worried About Running Out of Food in the Last Year: Sometimes true    Ran Out of Food in the Last Year: Sometimes true  Housing: High Risk (07/17/2021)   Housing    Last Housing Risk Score: 2  Physical Activity: Not on file  Social Connections: Not on file  Stress: Stress Concern Present (09/01/2021)   [place]    Feeling of Stress : Very much  Tobacco Use: Medium Risk (05/04/2022)   Patient History    Smoking Tobacco Use: Former    Smokeless Tobacco Use: Never    Passive Exposure: Not on file  Transportation Needs: No Transportation Needs (04/10/2021)   PRAPARE - Transportation    Lack of Transportation (Medical): No    Lack of Transportation (Non-Medical): No   [name], MSW, [place]  [phone]- work cell phone (preferred) [phone]- desk phone

## 2022-05-04 NOTE — Progress Notes (Signed)
[facility] CSW Progress Note  Clinical Education officer, museum met with patient to discuss Medicaid concerns.  Per pt his Medicaid is no longer active.  It is unclear if pt just didn't submit the necessary paperwork to renew it or if he now earns too much with his disability which he started receiving 3 months ago.  Pt instructed to call today to DSS to find out what he needs to do to either reinstate Medicaid or purchase [employer] insurance.  Pt was out of Eplerenone which was 29.98 out of pocket.  On Friday he will run out of Jardiance which is $498.82 out of pocket.  Message sent to Cardiology CSW regarding pt's medication.  Pt to contact cardiology CSW 7/26.  Pt given a [employer] card to assist w/ medication which needed to be filled today.  CSW to continue to follow throughout duration of treatment as appropriate.     Henriette Combs, LCSW

## 2022-05-04 NOTE — Progress Notes (Unsigned)
Connor Solomon [phone]   [phone]  PROGRESS NOTE  Patient Care Team: Delene Ruffini, MD as PCP - General (Internal Medicine) Pickenpack-Cousar, Carlena Sax, NP as Nurse Practitioner (Nurse Practitioner)  Hematological/Oncological History # Waldenstrom's Macroglobulinemia # IgM Monoclonal Gammopathy 04/07/2021: CT A/p showed mildly enlarged retroperitoneal lymph nodes and bilateral inguinal lymph nodes.  04/08/2021: IgM Kappa M protein 1.2 04/09/2021: WBC 11.1, Hgb 9.5, MCV 75.4, Plt 525 04/20/2021: Kappa 977, Lambda 7.8, K/L ratio 125.28. Serum viscosity 1.8 05/06/2021: Establish care with Dr. 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 Infusion reaction with ritux, held halfway through.  06/12/2021: Cycle 2 Day 1 of Ibrutinib + Rituximab 06/19/2021: Cycle 3 Day 1 of Ibrutinib + Rituximab 06/26/2021: Cycle 4 Day 1 of rituximab. Continued on daily ibrutinib 12/02/2021: Cycle 5 Day 1 of Ibrutinib + Rituximab 12/09/2021: Cycle 6 Day 1 of Ibrutinib + Rituximab 12/16/2021: Cycle 7 Day 1 of Ibrutinib + Rituximab 12/23/2021: Held Cycle 8 of Ritxumab due to nausea/vomiting, elevated creatinine, finger infection  Interval History:  Connor Solomon 61 y.o. male with medical history significant for Waldenstrom's macroglobulinemia who presents for a follow up visit. The patient's last visit was on 04/06/2022 In the interim since the last visit, he continued on Ibrutinib therapy.   Connor Solomon reports his energy levels are fairly stable.  He reports improvement of his blood blisters that were involving his face, hands and feet.  He reports improvement of swelling in his hands and feet as well.  He reports some nausea but that is well controlled with his prescribed antiemetics.  He denies any  vomiting episodes.  His appetite is stable and denies any weight loss.  His bowel habits are unchanged without any recurrent episodes of diarrhea or constipation.  He reports that his pain is well controlled with current pain regimen with methadone 10 mg 3 times a day.He denies fevers, chills, night sweats, shortness of breath, chest pain or cough. He has no other complaints.  Full 10 point ROS is listed below.   MEDICAL HISTORY:  Past Medical History:  Diagnosis Date   Acute heart failure ([place]) 04/08/2021   Acute HFrEF (heart failure with reduced ejection fraction) ([place]) 04/07/2021   Arthritis    hands, elbows bilaterally   Cancer ([facility])    Full dentures    Hypertension    Pt states he doen not have HTN and has never been treated for HTN   Kidney stone on left side last stone 4-5 yrs ago   recurrent (3 episodes)   Panic disorder    pt states has resolved   Syncope    resolved- was related to panic attacks    SURGICAL HISTORY: Past Surgical History:  Procedure Laterality Date   CARPAL METACARPAL FUSION WITH DISTAL RADIAL BONE GRAFT Left 05/30/2013   Procedure: LEFT THUMB METACARPAL JOINT FUSION;  Surgeon: Schuyler Amor, MD;  Location: [facility];  Service: Orthopedics;  Laterality: Left;   ESOPHAGOGASTRODUODENOSCOPY N/A 02/12/2014   Procedure: ESOPHAGOGASTRODUODENOSCOPY (EGD);  Surgeon: Jerene Bears, MD;  Location: [facility] ENDOSCOPY;  Service: Endoscopy;  Laterality: N/A;   FOREIGN BODY REMOVAL N/A 02/12/2014   Procedure: FOREIGN BODY REMOVAL;  Surgeon: Jerene Bears, MD;  Location: [facility];  Service: Endoscopy;  Laterality: N/A;   OPEN REDUCTION INTERNAL FIXATION (ORIF) DISTAL RADIAL FRACTURE Left 11/29/2012  Procedure: OPEN REDUCTION INTERNAL FIXATION (ORIF) DISTAL RADIAL FRACTURE;  Surgeon: Schuyler Amor, MD;  Location: [facility];  Service: Orthopedics;  Laterality: Left;  LEFT DISTAL RADIUS OSTEOTOMY WITH BONE GRAFT   RIGHT/LEFT HEART CATH AND  CORONARY ANGIOGRAPHY N/A 04/09/2021   Procedure: RIGHT/LEFT HEART CATH AND CORONARY ANGIOGRAPHY;  Surgeon: Jolaine Artist, MD;  Location: [facility];  Service: Cardiovascular;  Laterality: N/A;   TENDON REPAIR Left 02/07/2013   Procedure: LEFT EXTENSOR INDICIS PROPRIUS  TO EXTENSOR POLLICIS LONGUS TENDON TRANSFER;  Surgeon: Schuyler Amor, MD;  Location: [place] [facility];  Service: Orthopedics;  Laterality: Left;   WISDOM TOOTH EXTRACTION     WRIST SURGERY     fx only    SOCIAL HISTORY: Social History   Socioeconomic History   Marital status: Single    Spouse name: Not on file   Number of children: Not on file   Years of education: Not on file   Highest education level: Not on file  Occupational History   Not on file  Tobacco Use   Smoking status: Former    Types: Cigars    Quit date: 10/11/2020    Years since quitting: 1.5   Smokeless tobacco: Never   Tobacco comments:    smokes 1 cigar a week  Vaping Use   Vaping Use: Never used  Substance and Sexual Activity   Alcohol use: No   Drug use: No   Sexual activity: Not on file  Other Topics Concern   Not on file  Social History Narrative   Not on file   Social Determinants of Health   Financial Resource Strain: High Risk (07/17/2021)   Overall Financial Resource Strain (CARDIA)    Difficulty of Paying Living Expenses: Very hard  Food Insecurity: Food Insecurity Present (04/10/2021)   Hunger Vital Sign    Worried About [place] in the Last Year: Sometimes true    Ran Out of Food in the Last Year: Sometimes true  Transportation Needs: No Transportation Needs (04/10/2021)   PRAPARE - Hydrologist (Medical): No    Lack of Transportation (Non-Medical): No  Physical Activity: Not on file  Stress: Stress Concern Present (09/01/2021)   [place]    Feeling of Stress : Very much  Social Connections:  Not on file  Intimate Partner Violence: Not on file    FAMILY HISTORY: Family History  Problem Relation Age of Onset   Heart attack Father    Sudden Cardiac Death Father 31   Diabetes Neg Hx    Hyperlipidemia Neg Hx    Hypertension Neg Hx     ALLERGIES:  is allergic to rituxan [rituximab] and hydrocodone.  MEDICATIONS:  Current Outpatient Medications  Medication Sig Dispense Refill   acetaminophen (TYLENOL) 500 MG tablet Take 500 mg by mouth every 6 (six) hours as needed.     albuterol (VENTOLIN HFA) 108 (90 Base) MCG/ACT inhaler Inhale 2 puffs into the lungs every 6 hours as needed for wheezing or shortness of breath 18 g 2   carvedilol (COREG) 3.125 MG tablet Take 1 tablet by mouth 2 times daily. 68 tablet 2   dronabinol (MARINOL) 2.5 MG capsule Take 1 capsule by mouth 2 times daily before a meal. 60 capsule 0   empagliflozin (JARDIANCE) 10 MG TABS tablet Take 1 tablet by mouth daily before breakfast. 30 tablet 11   eplerenone (INSPRA)  25 MG tablet Take 1/2 tablet (12.5 mg total) by mouth daily. 15 tablet 3   [START ON 05/12/2022] escitalopram (LEXAPRO) 5 MG tablet Take 1 tablet (5 mg total) by mouth daily. 30 tablet 0   ferrous sulfate 325 (65 FE) MG tablet Take 1 tablet by mouth daily with breakfast. Please take with a source of Vitamin C 90 tablet 3   furosemide (LASIX) 20 MG tablet Take 1 tablet (20 mg total) by mouth daily as needed for swelling 100 tablet 2   ibrutinib (IMBRUVICA) 420 MG tablet Take 1 tablet (420 mg total) by mouth daily. 30 tablet 2   [START ON 05/07/2022] methadone (DOLOPHINE) 10 MG tablet Take 1 tablet by mouth every 8 hours. 90 tablet 0   metoCLOPramide (REGLAN) 10 MG tablet Take 1 tablet (10 mg total) by mouth every 8 (eight) hours as needed for nausea. 30 tablet 1   naloxone (NARCAN) nasal spray 4 mg/0.1 mL Take for overdose 1 each 0   naloxone (NARCAN) nasal spray 4 mg/0.1 mL Take for overdose as directed 2 each 0   ondansetron (ZOFRAN) 8 MG tablet Take 1  tablet by mouth every 8 hours as needed. 30 tablet 0   Oxycodone HCl 10 MG TABS Take 1 tablet by mouth every 6 hours as needed. 60 tablet 0   potassium chloride SA (KLOR-CON M) 20 MEQ tablet Take 1 tablet (20 mEq total) by mouth daily. 10 tablet 0   prochlorperazine (COMPAZINE) 10 MG tablet Take 1 tablet (10 mg total) by mouth every 6 (six) hours as needed for nausea or vomiting. 30 tablet 0   sacubitril-valsartan (ENTRESTO) 24-26 MG Take 1 tablet by mouth 2 (two) times daily. 60 tablet 5   No current facility-administered medications for this visit.    REVIEW OF SYSTEMS:   Constitutional: ( - ) fevers, ( - )  chills , ( - ) night sweats Eyes: ( - ) blurriness of vision, ( - ) double vision, ( - ) watery eyes Ears, nose, mouth, throat, and face: ( - ) mucositis, ( - ) sore throat Respiratory: ( - ) cough, ( - ) dyspnea, ( - ) wheezes Cardiovascular: ( - ) palpitation, ( - ) chest discomfort, ( - ) lower extremity swelling Gastrointestinal:  ( + ) nausea, ( - ) heartburn, ( - ) change in bowel habits Skin: ( - ) abnormal skin rashes Lymphatics: ( - ) new lymphadenopathy, ( - ) easy bruising Neurological: ( - ) numbness, ( - ) tingling, ( - ) new weaknesses Behavioral/Psych: ( - ) mood change, ( - ) new changes  All other systems were reviewed with the patient and are negative.  PHYSICAL EXAMINATION: ECOG PERFORMANCE STATUS: 1 - Symptomatic but completely ambulatory  Vitals:   05/04/22 1314  BP: 128/71  Pulse: 60  Resp: 18  Temp: 97.8 F (36.6 C)  SpO2: 100%     Filed Weights   05/04/22 1314  Weight: 130 lb 3.2 oz (59.1 kg)      GENERAL: Chronically ill appearing middle-aged Caucasian male, alert, no distress and comfortable SKIN: skin color, texture, turgor are normal, no rashes or significant lesions.  EYES: conjunctiva are pink and non-injected, sclera clear LUNGS: clear to auscultation and percussion with normal breathing effort HEART: regular rate & rhythm and no  murmurs and no lower extremity edema  PSYCH: alert & oriented x 3, fluent speech NEURO: no focal motor/sensory deficits   LABORATORY DATA:  I have reviewed the  data as listed    Latest Ref Rng & Units 05/04/2022    1:01 PM 04/06/2022   10:33 AM 03/10/2022    1:31 PM  CBC  WBC 4.0 - 10.5 K/uL 5.5  5.2  8.0   Hemoglobin 13.0 - 17.0 g/dL 13.2  13.8  14.3   Hematocrit 39.0 - 52.0 % 40.1  41.6  43.4   Platelets 150 - 400 K/uL 202  180  224        Latest Ref Rng & Units 05/04/2022    1:01 PM 04/06/2022   10:33 AM 03/10/2022    1:31 PM  CMP  Glucose 70 - 99 mg/dL 90  84  78   BUN 8 - 23 mg/dL _0 Creatinine 0.61 - 1.24 mg/dL 0.87  0.86  1.33   Sodium 135 - 145 mmol/L 136  135  135   Potassium 3.5 - 5.1 mmol/L 3.7  3.6  4.3   Chloride 98 - 111 mmol/L 105  104  100   CO2 22 - 32 mmol/L _1 Calcium 8.9 - 10.3 mg/dL 8.5  8.7  9.1   Total Protein 6.5 - 8.1 g/dL 5.4  5.6  5.9   Total Bilirubin 0.3 - 1.2 mg/dL 0.5  0.4  0.9   Alkaline Phos 38 - 126 U/L 67  70  62   AST 15 - 41 U/L _2 ALT 0 - 44 U/L _3 Lab Results  Component Value Date   MPROTEIN 0.1 (H) 04/06/2022   MPROTEIN 0.1 (H) 03/10/2022   MPROTEIN 0.1 (H) 01/28/2022   Lab Results  Component Value Date   KPAFRELGTCHN 26.3 (H) 05/04/2022   KPAFRELGTCHN 30.8 (H) 04/06/2022   KPAFRELGTCHN 34.5 (H) 03/10/2022   LAMBDASER 1.6 (L) 05/04/2022   LAMBDASER 4.2 (L) 04/06/2022   LAMBDASER 3.7 (L) 03/10/2022   KAPLAMBRATIO 16.44 (H) 05/04/2022   KAPLAMBRATIO 7.33 (H) 04/06/2022   KAPLAMBRATIO 9.32 (H) 03/10/2022    RADIOGRAPHIC STUDIES: No results found.  ASSESSMENT & PLAN Connor Solomon 61 y.o. male with medical history significant for Waldenstrom's macroglobulinemia who presents for a follow up visit.   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  There  is no clear evidence of hyperviscosity syndrome at this time.  At this time we will plan to proceed with ibrutinib and rituximab therapy.  One could also consider Bendamustine and rituximab therapy but given the patient's degree of anemia I would prefer pursuing ibrutinib and rituximab at this time.  Additionally ibrutinib/rituximab are category 1 recommendations per the NCCN guidelines.  The treatment will consist of ibrutinib 420 mg p.o. daily with rituximab on weeks 1 through 4 as well as weeks 27 through 30.  Previously we discussed the risks and benefits of therapy including (but not limited to) risk of bleeding, fatigue, atypical infections, and cardiac arrhythmias.  The patient voices understanding of this plan moving forward.   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 --Started second round of weekly rituximab on 12/02/2021. Continued weekly x 4 weeks.   --  last rituximab dose held due to missed visits/illness.  Plan: --Labs today were reviewed without any intervention needed.  Labs show white cell count 5.5, hemoglobin 13.2, MCV 83.4, and platelets of 202. Pending SPEP and sFLC levels but prior levels appear stable. --continues on ibrutinib 420 mg PO daily.  --RTC in 4 weeks with labs   # Hand/Foot/Mouth Blisters -- Findings appear to be most consistent with hand-foot-and-mouth disease --Suspect he may have contracted this from his 67-year-old that lives in his house --Supportive care only at this time, symptoms have nearly resolved.  --Continue to monitor  #Pain Management --Pain was poorly controlled on hydrocodone therapy previously.  --Patient was previously admitted for for  opioid overdose with presumed fentanyl --Currently pain regimen includes methadone 10 mg TID per palliative care.   #Anemia 2/2 to Lymphoma-resolved --Hgb 13.2 , Plt 202 --There does appear to be a component of iron deficiency anemia as well.  Currently on PO ferrous sulfate to try and bolster his iron levels.  --Continue to monitor.  #Nausea and vomiting--improving -- Concern that the nausea and vomiting may not be related to the ibrutinib therapy and potentially tied to poor gastric motility due to opioid prescription.  --Unable to tolerate olanzopine so discontinued --Not taking metoclopramide 10 mg every 8 hours  --continue zofran PRN.   #Supportive Care -- chemotherapy education complete -- port placement not required -- allopurinol 381m PO daily for TLS prophylaxis. D/c as patient is no longer on rituximab.   No orders of the defined types were placed in this encounter.  All questions were answered. The patient knows to call the clinic with any problems, questions or concerns.  I have spent a total of 30 minutes minutes of face-to-face and non-face-to-face time, preparing to see the patient,  performing a medically appropriate examination, counseling and educating the patient, ordering medications/tests, referring and communicating with other health care professionals, documenting clinical information in the electronic health record, and care coordination.   IDede QueryPA-C Dept of Hematology and [facility]omerat [facility] WatongaPhone: [phone]  05/05/2022 4:21 PM  Dimopoulos MA, TRennie Natter TLonzo Cloud Mahe B, Herbaux C, Tam C, Orsucci L, Palomba ML, Matous JV, Shustik C, Kastritis E, TBaldwin Li J, Salman Z, Graef T, Buske C; iNNOVATE Study Group and the EM[employer]for W[employer] Phase 3 Trial of Ibrutinib plus Rituximab in Waldenstrm's Macroglobulinemia. NAlison StallingJ Med. 2018  Jun 21;378(25):2399-2410.   --At 30 months, the progression-free survival rate was 82% with ibrutinib-rituximab versus 28% with placebo-rituximab (hazard ratio for progression or death, 0.20; P<0.001).

## 2022-05-05 ENCOUNTER — Encounter: Payer: Self-pay | Admitting: Hematology and Oncology

## 2022-05-05 ENCOUNTER — Other Ambulatory Visit (HOSPITAL_COMMUNITY): Payer: Self-pay | Admitting: *Deleted

## 2022-05-05 ENCOUNTER — Other Ambulatory Visit (HOSPITAL_COMMUNITY): Payer: Self-pay

## 2022-05-05 ENCOUNTER — Telehealth (HOSPITAL_COMMUNITY): Payer: Self-pay | Admitting: Licensed Clinical Social Worker

## 2022-05-05 LAB — KAPPA/LAMBDA LIGHT CHAINS
Kappa free light chain: 26.3 mg/L — ABNORMAL HIGH (ref 3.3–19.4)
Kappa, lambda light chain ratio: 16.44 — ABNORMAL HIGH (ref 0.26–1.65)
Lambda free light chains: 1.6 mg/L — ABNORMAL LOW (ref 5.7–26.3)

## 2022-05-05 MED ORDER — EPLERENONE 25 MG PO TABS
12.5000 mg | ORAL_TABLET | Freq: Every day | ORAL | 3 refills | Status: DC
  Filled 2022-05-11: qty 15, 30d supply, fill #0
  Filled 2022-05-31 – 2022-06-09 (×2): qty 15, 30d supply, fill #1
  Filled 2022-07-07: qty 15, 30d supply, fill #2
  Filled 2022-08-05: qty 15, 30d supply, fill #3

## 2022-05-05 NOTE — Telephone Encounter (Signed)
CSW called pt to inform that [facility] was able to get his medications sent to [facility] under heart failure fund so his jardiance and other heart failure medications should be free while he is uninsured.   Unable to reach- left messages requesting return call.  Will continue to follow and assist as needed  Jorge Ny, [facility] Desk#: [phone] Cell#: [phone]

## 2022-05-06 ENCOUNTER — Telehealth: Payer: Self-pay | Admitting: Hematology and Oncology

## 2022-05-06 NOTE — Telephone Encounter (Signed)
Scheduled per 7/25 los, message has been left with pt

## 2022-05-07 ENCOUNTER — Other Ambulatory Visit (HOSPITAL_COMMUNITY): Payer: Self-pay

## 2022-05-07 ENCOUNTER — Encounter: Payer: Self-pay | Admitting: Hematology and Oncology

## 2022-05-08 ENCOUNTER — Encounter: Payer: Self-pay | Admitting: Hematology and Oncology

## 2022-05-08 ENCOUNTER — Other Ambulatory Visit (HOSPITAL_COMMUNITY): Payer: Self-pay

## 2022-05-10 LAB — MULTIPLE MYELOMA PANEL, SERUM
Albumin SerPl Elph-Mcnc: 3.1 g/dL (ref 2.9–4.4)
Albumin/Glob SerPl: 1.9 — ABNORMAL HIGH (ref 0.7–1.7)
Alpha 1: 0.2 g/dL (ref 0.0–0.4)
Alpha2 Glob SerPl Elph-Mcnc: 0.6 g/dL (ref 0.4–1.0)
B-Globulin SerPl Elph-Mcnc: 0.7 g/dL (ref 0.7–1.3)
Gamma Glob SerPl Elph-Mcnc: 0.3 g/dL — ABNORMAL LOW (ref 0.4–1.8)
Globulin, Total: 1.7 g/dL — ABNORMAL LOW (ref 2.2–3.9)
IgA: 8 mg/dL — ABNORMAL LOW (ref 61–437)
IgG (Immunoglobin G), Serum: 111 mg/dL — ABNORMAL LOW (ref 603–1613)
IgM (Immunoglobulin M), Srm: 110 mg/dL (ref 20–172)
M Protein SerPl Elph-Mcnc: 0.1 g/dL — ABNORMAL HIGH
Total Protein ELP: 4.8 g/dL — ABNORMAL LOW (ref 6.0–8.5)

## 2022-05-11 ENCOUNTER — Other Ambulatory Visit (HOSPITAL_COMMUNITY): Payer: Self-pay

## 2022-05-11 ENCOUNTER — Other Ambulatory Visit: Payer: Self-pay | Admitting: Nurse Practitioner

## 2022-05-11 ENCOUNTER — Encounter: Payer: Self-pay | Admitting: Hematology and Oncology

## 2022-05-11 MED ORDER — DRONABINOL 2.5 MG PO CAPS
2.5000 mg | ORAL_CAPSULE | Freq: Two times a day (BID) | ORAL | 0 refills | Status: DC
  Filled 2022-05-11: qty 60, 30d supply, fill #0
  Filled 2022-05-13: qty 30, 15d supply, fill #0
  Filled 2022-05-17 – 2022-07-14 (×3): qty 60, 30d supply, fill #0

## 2022-05-12 ENCOUNTER — Telehealth: Payer: Self-pay

## 2022-05-12 ENCOUNTER — Encounter: Payer: Self-pay | Admitting: Licensed Clinical Social Worker

## 2022-05-12 DIAGNOSIS — C880 Waldenstrom macroglobulinemia: Secondary | ICD-10-CM

## 2022-05-12 NOTE — Telephone Encounter (Signed)
Pt wife called stating that pt had lost his medicare, pt wife asking for resources, this RN did not know of any so pt wife transferred to social work office.

## 2022-05-12 NOTE — Progress Notes (Signed)
[place] CSW Progress Note  Holiday representative  spoke w/ pt's significant other  to discuss insurance concerns.  Pt recently was approved for disability and has started to receive disability checks; unfortunately, the amount pt is getting for disability has made him ineligible for Medicaid.  Pt is currently w/o insurance.  CSW advised to obtain market [employer] insurance as pt will not be eligible for Medicare for 2 years.  Pt will need to contact clinics individually to enroll for charitable programs for current medications if they are available.  Pt instructed to call financial resources in order to ensure he is able to obtain his chemo medication.  CSW to remain available to assist pt as appropriate throughout duration of treatment.      Henriette Combs, LCSW

## 2022-05-13 ENCOUNTER — Other Ambulatory Visit (HOSPITAL_COMMUNITY): Payer: Self-pay

## 2022-05-13 ENCOUNTER — Other Ambulatory Visit: Payer: Self-pay | Admitting: Nurse Practitioner

## 2022-05-14 ENCOUNTER — Encounter: Payer: Self-pay | Admitting: Hematology and Oncology

## 2022-05-14 ENCOUNTER — Other Ambulatory Visit (HOSPITAL_COMMUNITY): Payer: Self-pay

## 2022-05-14 MED ORDER — ONDANSETRON HCL 8 MG PO TABS
8.0000 mg | ORAL_TABLET | Freq: Three times a day (TID) | ORAL | 0 refills | Status: DC | PRN
  Filled 2022-05-14 – 2022-05-31 (×2): qty 30, 10d supply, fill #0
  Filled 2022-07-29: qty 12, 4d supply, fill #0
  Filled 2022-07-29: qty 6, 2d supply, fill #0
  Filled 2022-07-29: qty 18, 21d supply, fill #0

## 2022-05-14 NOTE — Telephone Encounter (Signed)
Refill for zofran sent in

## 2022-05-17 ENCOUNTER — Other Ambulatory Visit: Payer: Self-pay | Admitting: Nurse Practitioner

## 2022-05-17 DIAGNOSIS — Z515 Encounter for palliative care: Secondary | ICD-10-CM

## 2022-05-17 DIAGNOSIS — C880 Waldenstrom macroglobulinemia: Secondary | ICD-10-CM

## 2022-05-17 DIAGNOSIS — G893 Neoplasm related pain (acute) (chronic): Secondary | ICD-10-CM

## 2022-05-18 ENCOUNTER — Encounter: Payer: Self-pay | Admitting: Hematology and Oncology

## 2022-05-18 ENCOUNTER — Other Ambulatory Visit (HOSPITAL_COMMUNITY): Payer: Self-pay

## 2022-05-18 ENCOUNTER — Other Ambulatory Visit: Payer: Self-pay | Admitting: Nurse Practitioner

## 2022-05-18 DIAGNOSIS — Z515 Encounter for palliative care: Secondary | ICD-10-CM

## 2022-05-18 DIAGNOSIS — C880 Waldenstrom macroglobulinemia: Secondary | ICD-10-CM

## 2022-05-18 DIAGNOSIS — G893 Neoplasm related pain (acute) (chronic): Secondary | ICD-10-CM

## 2022-05-18 MED ORDER — OXYCODONE HCL 10 MG PO TABS: 10.0000 mg | ORAL_TABLET | Freq: Four times a day (QID) | ORAL | 0 refills | Status: DC | PRN

## 2022-05-18 MED ORDER — OXYCODONE HCL 10 MG PO TABS
10.0000 mg | ORAL_TABLET | Freq: Four times a day (QID) | ORAL | 0 refills | Status: DC | PRN
  Filled 2022-05-18: qty 60, 15d supply, fill #0

## 2022-05-19 ENCOUNTER — Inpatient Hospital Stay: Payer: Self-pay | Attending: Hematology and Oncology | Admitting: Nurse Practitioner

## 2022-05-19 ENCOUNTER — Encounter: Payer: Self-pay | Admitting: Hematology and Oncology

## 2022-05-19 ENCOUNTER — Other Ambulatory Visit (HOSPITAL_COMMUNITY): Payer: Self-pay

## 2022-05-19 ENCOUNTER — Encounter: Payer: Self-pay | Admitting: Nurse Practitioner

## 2022-05-19 DIAGNOSIS — G893 Neoplasm related pain (acute) (chronic): Secondary | ICD-10-CM | POA: Insufficient documentation

## 2022-05-19 DIAGNOSIS — C880 Waldenstrom macroglobulinemia: Secondary | ICD-10-CM | POA: Insufficient documentation

## 2022-05-19 DIAGNOSIS — Z79899 Other long term (current) drug therapy: Secondary | ICD-10-CM | POA: Insufficient documentation

## 2022-05-19 DIAGNOSIS — D472 Monoclonal gammopathy: Secondary | ICD-10-CM | POA: Insufficient documentation

## 2022-05-19 DIAGNOSIS — I110 Hypertensive heart disease with heart failure: Secondary | ICD-10-CM | POA: Insufficient documentation

## 2022-05-19 DIAGNOSIS — Z515 Encounter for palliative care: Secondary | ICD-10-CM

## 2022-05-19 DIAGNOSIS — K5903 Drug induced constipation: Secondary | ICD-10-CM | POA: Insufficient documentation

## 2022-05-19 DIAGNOSIS — R634 Abnormal weight loss: Secondary | ICD-10-CM

## 2022-05-19 DIAGNOSIS — T402X5A Adverse effect of other opioids, initial encounter: Secondary | ICD-10-CM | POA: Insufficient documentation

## 2022-05-19 DIAGNOSIS — R630 Anorexia: Secondary | ICD-10-CM

## 2022-05-19 MED ORDER — MEGESTROL ACETATE 20 MG PO TABS
20.0000 mg | ORAL_TABLET | Freq: Every day | ORAL | 1 refills | Status: DC
  Filled 2022-05-19: qty 30, 30d supply, fill #0
  Filled 2022-07-07: qty 30, 30d supply, fill #1

## 2022-05-19 NOTE — Progress Notes (Signed)
[place]  [phone] [phone]   Name: Connor Solomon Date: 05/19/2022 MRN: [id number]  DOB: Apr 21, 1961  Patient Care Team: Delene Ruffini, MD as PCP - General (Internal Medicine) Pickenpack-Cousar, Carlena Sax, NP as Nurse Practitioner (Nurse Practitioner)   I connected with Connor Solomon on 05/19/22 at 10:30 AM EDT by phone and verified that I am speaking with the correct person using two identifiers.   I discussed the limitations, risks, security and privacy concerns of performing an evaluation and management service by telemedicine and the availability of in-person appointments. I also discussed with the patient that there may be a patient responsible charge related to this service. The patient expressed understanding and agreed to proceed.   Other persons participating in the visit and their role in the encounter: [place], RN    Patient's location: Home   Provider's location: [place]    Chief Complaint: Symptom Management    REASON FOR CONSULTATION: Connor Solomon is a 61 y.o. 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 Palliative ask to see for symptom management.  SOCIAL HISTORY:     reports that he quit smoking about 19 months ago. His smoking use included cigars. He has never used smokeless tobacco. He reports that he does not drink alcohol and does not use drugs.  ADVANCE DIRECTIVES:    CODE STATUS:   PAST MEDICAL HISTORY: Past Medical History:  Diagnosis Date   Acute heart failure ([place]) 04/08/2021   Acute HFrEF (heart failure with reduced ejection fraction) ([place]) 04/07/2021   Arthritis    hands, elbows bilaterally   Cancer ([facility])    Full dentures    Hypertension    Pt states he doen not have HTN and has never been treated for HTN   Kidney stone on left side last stone 4-5 yrs ago   recurrent (3  episodes)   Panic disorder    pt states has resolved   Syncope    resolved- was related to panic attacks    PAST SURGICAL HISTORY:  Past Surgical History:  Procedure Laterality Date   CARPAL METACARPAL FUSION WITH DISTAL RADIAL BONE GRAFT Left 05/30/2013   Procedure: LEFT THUMB METACARPAL JOINT FUSION;  Surgeon: Schuyler Amor, MD;  Location: [place];  Service: Orthopedics;  Laterality: Left;   ESOPHAGOGASTRODUODENOSCOPY N/A 02/12/2014   Procedure: ESOPHAGOGASTRODUODENOSCOPY (EGD);  Surgeon: Jerene Bears, MD;  Location: [facility] ENDOSCOPY;  Service: Endoscopy;  Laterality: N/A;   FOREIGN BODY REMOVAL N/A 02/12/2014   Procedure: FOREIGN BODY REMOVAL;  Surgeon: Jerene Bears, MD;  Location: [place];  Service: Endoscopy;  Laterality: N/A;   OPEN REDUCTION INTERNAL FIXATION (ORIF) DISTAL RADIAL FRACTURE Left 11/29/2012   Procedure: OPEN REDUCTION INTERNAL FIXATION (ORIF) DISTAL RADIAL FRACTURE;  Surgeon: Schuyler Amor, MD;  Location: [place];  Service: Orthopedics;  Laterality: Left;  LEFT DISTAL RADIUS OSTEOTOMY WITH BONE GRAFT   RIGHT/LEFT HEART CATH AND CORONARY ANGIOGRAPHY N/A 04/09/2021   Procedure: RIGHT/LEFT HEART CATH AND CORONARY ANGIOGRAPHY;  Surgeon: Jolaine Artist, MD;  Location: [place] [facility];  Service: Cardiovascular;  Laterality: N/A;   TENDON REPAIR Left 02/07/2013   Procedure: LEFT EXTENSOR INDICIS PROPRIUS  TO EXTENSOR POLLICIS LONGUS TENDON TRANSFER;  Surgeon: Schuyler Amor, MD;  Location: [place];  Service: Orthopedics;  Laterality: Left;   WISDOM TOOTH EXTRACTION  WRIST SURGERY     fx only    HEMATOLOGY/ONCOLOGY HISTORY:  Oncology History  Waldenstrom macroglobulinemia ([place])  05/24/2021 Initial Diagnosis   Waldenstrom macroglobulinemia ([place])   06/05/2021 -  Chemotherapy   Patient is on Treatment Plan : NON-HODGKINS LYMPHOMA Rituximab q7d       ALLERGIES:  is allergic to rituxan [rituximab] and  hydrocodone.  MEDICATIONS:  Current Outpatient Medications  Medication Sig Dispense Refill   megestrol (MEGACE) 20 MG tablet Take 1 tablet (20 mg total) by mouth daily. 30 tablet 1   acetaminophen (TYLENOL) 500 MG tablet Take 500 mg by mouth every 6 (six) hours as needed.     albuterol (VENTOLIN HFA) 108 (90 Base) MCG/ACT inhaler Inhale 2 puffs into the lungs every 6 hours as needed for wheezing or shortness of breath 6.7 g 2   carvedilol (COREG) 3.125 MG tablet Take 1 tablet by mouth 2 times daily. 68 tablet 2   dronabinol (MARINOL) 2.5 MG capsule Take 1 capsule by mouth 2 times daily before a meal. 60 capsule 0   empagliflozin (JARDIANCE) 10 MG TABS tablet Take 1 tablet by mouth daily before breakfast. 30 tablet 11   eplerenone (INSPRA) 25 MG tablet Take 1/2 tablet (12.5 mg total) by mouth daily. 15 tablet 3   escitalopram (LEXAPRO) 5 MG tablet Take 1 tablet (5 mg total) by mouth daily. 30 tablet 0   ferrous sulfate 325 (65 FE) MG tablet Take 1 tablet by mouth daily with breakfast. Please take with a source of Vitamin C 90 tablet 3   furosemide (LASIX) 20 MG tablet Take 1 tablet (20 mg total) by mouth daily as needed for swelling 100 tablet 2   ibrutinib (IMBRUVICA) 420 MG tablet Take 1 tablet (420 mg total) by mouth daily. 30 tablet 2   methadone (DOLOPHINE) 10 MG tablet Take 1 tablet by mouth every 8 hours. 90 tablet 0   metoCLOPramide (REGLAN) 10 MG tablet Take 1 tablet (10 mg total) by mouth every 8 (eight) hours as needed for nausea. 30 tablet 1   naloxone (NARCAN) nasal spray 4 mg/0.1 mL Take for overdose 1 each 0   naloxone (NARCAN) nasal spray 4 mg/0.1 mL Take for overdose as directed 2 each 0   ondansetron (ZOFRAN) 8 MG tablet Take 1 tablet by mouth every 8 hours as needed. 30 tablet 0   Oxycodone HCl 10 MG TABS Take 1 tablet by mouth every 6 hours as needed. 60 tablet 0   potassium chloride SA (KLOR-CON M) 20 MEQ tablet Take 1 tablet (20 mEq total) by mouth daily. 10 tablet 0    prochlorperazine (COMPAZINE) 10 MG tablet Take 1 tablet (10 mg total) by mouth every 6 (six) hours as needed for nausea or vomiting. 30 tablet 0   sacubitril-valsartan (ENTRESTO) 24-26 MG Take 1 tablet by mouth 2 (two) times daily. 60 tablet 5   No current facility-administered medications for this visit.    VITAL SIGNS: There were no vitals taken for this visit. There were no vitals filed for this visit.   Estimated body mass index is 19.8 kg/m as calculated from the following:   Height as of 05/04/22: $RemoveBef'5\' 8"'OgePATzCly$  (1.727 m).   Weight as of 05/04/22: 130 lb 3.2 oz (59.1 kg).   PERFORMANCE STATUS (ECOG) : 2 - Symptomatic, <50% confined to bed   IMPRESSION:  I connected with Connor Solomon for close symptom management follow-up. Denies any acute changes. No distress identified. Reports he no longer has his  medicaid and is concerned about ability to afford his medications. We discussed at length each of his medications and will make adjustments to support his financial needs in regards to symptom management. Overall reports feeling better. Some redness to soles of feet. Reports blisters have healed but some tenderness.   Neoplasm related pain Pain is better controlled. He is tolerating Methadone 10mg  three times daily. Denies drowsiness. He is able to afford as this is one of the cheaper options.   We will plan to continue close monitoring and adjust as needed.   Constipation Reports daily or every other day bowel movements. Education provided on opioid induced constipation. Recommended Miralax daily. If no bowel movement in 48 hours he knows to increase to twice daily.   Appetite/decreased appetite.  Moody is concerned about not being able to afford his marinol. Reports he was sleeping much better and has appetite has greatly improved. Education provided on use of megace which is more affordable and offers the same responses. He verbalized understanding.   Is trying to drink at least 1-2 ensure  daily for additional support.   Weight  is stable at 130lbs. He was134 lbs  6/7, 139lbs 5/16 up from 131lbs 4/20, down from 147.8lbs on 3/22.   Education provided on eating small frequent meals versus focusing on 3 large meals per day. Protein enriched foots. Eating when he has a desire. Denies nausea/vomiting. ll continue to closely monitor.   He has an appetite at times. Other times when he has a desire once he begins eating interest diminishes. Education provided on appetite stimulant.   We will continue to closely monitor.   4. Anxiety/Anxiousness  Tolerating escitalopram well. Noticeable decrease in his anxiety. Will continue to closely monitor.   Connor Solomon was started on escitalopram during office visit on 5/31. Dose was increased to 10mg  on 6/14. Tolerating well. Encouraged to continue with medication as prescribed allowing ongoing monitoring and adjustments as needed. He verbalized understanding and appreciation.   Connor Solomon concerns with ongoing anxiety and anxiousness. States his mind sometimes wanders making him have increased anxiety thinking about the unknown and what ifs. Emotional support provided. He took one of his relative's Xanax and felt this helped him tremendously. Education provided on importance of not taking medications not prescribed as this could interfere with other medications or cause complications. We also discussed concerns with certain medications which could contribute to respiratory depression, lethargy in correlation with his pain medications.   He is emotional regarding his anxiety. Wife expresses concerns over this as well. Education provided on use of more long-term medication which can provide similar relief as Xanax. We will plan to start on Lexapro daily. Education provided on side effects in addition to importance on not stopping abruptly and taking as prescribed. He understands it may take some time for effectiveness and will not be an immediate response.    We will closely monitor.   5. Goals of care (5/16) Connor Solomon Solomon his realistic understanding of his condition. He and his wife are making sure all "affairs" are in order as his biggest worry is leaving his wife in a financial constraint. Reports communicating with his life insurance policy etc. Emotional support provided.   He is emotional expressing racing thoughts during idol time or interruptions in his sleep as his mind wonders thinking about worst case scenario. Is requesting something to assist with sleeping and anxiety. Acknowledged his request. Education provided on limited use in collaboration with other medications including zyprexa. He  verbalized understanding and aware  we will continue to support and monitor for needs.    PLAN: methadone 10 mg every 8 hours. Close monitoring. Tolerating well. Pain improved.  Oxycodone 10 mg as needed for breakthrough pain.  Miralax daily for constipation Lexapro 10 mg daily-tolerating well. Will continue to monitor and adjust accordingly. Zofran as needed for nausea as prescribed.  Will switch to magace $RemoveB'20mg'zSqrcQdm$  daily for appetite stimulant. Unable to afford marinol due to loss of insurance.  I will plan to see patient back in 3-4 weeks in collaboration to other oncology appointments.    Patient expressed understanding and was in agreement with this plan. He also understands that He can call the clinic at any time with any questions, concerns, or complaints.   Thank you for your referral and allowing Palliative to assist in [name] [facility] care.   Any controlled substances utilized were prescribed in the context of palliative care. PDMP has been reviewed.    Time Total: 25 min   Visit consisted of counseling and education dealing with the complex and emotionally intense issues of symptom management and palliative care in the setting of serious and potentially life-threatening illness.Greater than 50%  of this time was spent counseling  and coordinating care related to the above assessment and plan.  Alda Lea, AGPCNP-BC  Palliative Medicine Team/[place]

## 2022-05-31 ENCOUNTER — Other Ambulatory Visit: Payer: Self-pay | Admitting: Nurse Practitioner

## 2022-05-31 DIAGNOSIS — Z515 Encounter for palliative care: Secondary | ICD-10-CM

## 2022-05-31 DIAGNOSIS — C880 Waldenstrom macroglobulinemia: Secondary | ICD-10-CM

## 2022-05-31 DIAGNOSIS — R110 Nausea: Secondary | ICD-10-CM

## 2022-05-31 DIAGNOSIS — G893 Neoplasm related pain (acute) (chronic): Secondary | ICD-10-CM

## 2022-06-01 ENCOUNTER — Encounter: Payer: Self-pay | Admitting: Hematology and Oncology

## 2022-06-01 ENCOUNTER — Other Ambulatory Visit (HOSPITAL_COMMUNITY): Payer: Self-pay

## 2022-06-01 MED ORDER — OXYCODONE HCL 10 MG PO TABS
10.0000 mg | ORAL_TABLET | Freq: Four times a day (QID) | ORAL | 0 refills | Status: DC | PRN
  Filled 2022-06-01: qty 60, 15d supply, fill #0

## 2022-06-01 MED ORDER — PROCHLORPERAZINE MALEATE 10 MG PO TABS
10.0000 mg | ORAL_TABLET | Freq: Four times a day (QID) | ORAL | 1 refills | Status: DC | PRN
  Filled 2022-06-01: qty 60, 15d supply, fill #0

## 2022-06-02 ENCOUNTER — Telehealth: Payer: Self-pay | Admitting: Pharmacy Technician

## 2022-06-02 NOTE — Telephone Encounter (Signed)
Oral Oncology Patient Advocate Encounter   Began renewal application for assistance for Imbruvica through [employer].   Application will be submitted upon completion of necessary supporting documentation.   [place] [employer] phone number [phone].   I will continue to check the status until final determination.   Lady Deutscher, CPhT-Adv Pharmacy Patient Advocate Specialist [place] Patient Advocate Team Direct Number: [phone]  Fax: [phone]

## 2022-06-02 NOTE — Telephone Encounter (Signed)
Oral Oncology Patient Advocate Encounter   Received notification that the application for assistance for Imbruvica through [place] [employer] has been approved.   [employer] phone number [phone].   Effective dates: 06/02/2022 through 06/02/2023    Connor Solomon, Connor Solomon Patient Advocate Specialist [place] Patient Advocate Team Direct Number: [phone]  Fax: [phone]

## 2022-06-07 ENCOUNTER — Other Ambulatory Visit (HOSPITAL_COMMUNITY): Payer: Self-pay | Admitting: Pharmacist

## 2022-06-07 MED ORDER — ENTRESTO 24-26 MG PO TABS
1.0000 | ORAL_TABLET | Freq: Two times a day (BID) | ORAL | 3 refills | Status: DC
Start: 2022-06-07 — End: 2024-01-10

## 2022-06-09 ENCOUNTER — Other Ambulatory Visit: Payer: Self-pay | Admitting: Nurse Practitioner

## 2022-06-09 ENCOUNTER — Other Ambulatory Visit (HOSPITAL_COMMUNITY): Payer: Self-pay

## 2022-06-09 ENCOUNTER — Other Ambulatory Visit: Payer: Self-pay

## 2022-06-09 ENCOUNTER — Telehealth: Payer: Self-pay

## 2022-06-09 ENCOUNTER — Encounter: Payer: Self-pay | Admitting: Hematology and Oncology

## 2022-06-09 DIAGNOSIS — R110 Nausea: Secondary | ICD-10-CM

## 2022-06-09 DIAGNOSIS — I5022 Chronic systolic (congestive) heart failure: Secondary | ICD-10-CM

## 2022-06-09 DIAGNOSIS — G893 Neoplasm related pain (acute) (chronic): Secondary | ICD-10-CM

## 2022-06-09 DIAGNOSIS — C880 Waldenstrom macroglobulinemia: Secondary | ICD-10-CM

## 2022-06-09 DIAGNOSIS — Z515 Encounter for palliative care: Secondary | ICD-10-CM

## 2022-06-09 DIAGNOSIS — F068 Other specified mental disorders due to known physiological condition: Secondary | ICD-10-CM

## 2022-06-09 MED ORDER — METHADONE HCL 10 MG PO TABS
10.0000 mg | ORAL_TABLET | Freq: Three times a day (TID) | ORAL | 0 refills | Status: DC
  Filled 2022-06-09: qty 90, 30d supply, fill #0

## 2022-06-09 MED ORDER — OXYCODONE HCL 10 MG PO TABS
10.0000 mg | ORAL_TABLET | Freq: Four times a day (QID) | ORAL | 0 refills | Status: DC | PRN
  Filled 2022-06-09: qty 60, 15d supply, fill #0
  Filled 2022-06-10: qty 20, 5d supply, fill #0
  Filled 2022-06-10: qty 40, 10d supply, fill #0
  Filled 2022-06-15: qty 20, 5d supply, fill #1

## 2022-06-09 NOTE — Telephone Encounter (Signed)
Per Lexine Baton NP, pt and wife requesting to speak with LCSW regarding their feelings about patient's increasing decline in health. Social work referral sent.

## 2022-06-10 ENCOUNTER — Inpatient Hospital Stay (HOSPITAL_BASED_OUTPATIENT_CLINIC_OR_DEPARTMENT_OTHER): Payer: Self-pay | Admitting: Hematology and Oncology

## 2022-06-10 ENCOUNTER — Inpatient Hospital Stay: Payer: Self-pay

## 2022-06-10 ENCOUNTER — Encounter: Payer: Self-pay | Admitting: Nurse Practitioner

## 2022-06-10 ENCOUNTER — Encounter: Payer: Self-pay | Admitting: Hematology and Oncology

## 2022-06-10 ENCOUNTER — Other Ambulatory Visit: Payer: Self-pay | Admitting: Hematology and Oncology

## 2022-06-10 ENCOUNTER — Inpatient Hospital Stay (HOSPITAL_BASED_OUTPATIENT_CLINIC_OR_DEPARTMENT_OTHER): Payer: Self-pay | Admitting: Nurse Practitioner

## 2022-06-10 ENCOUNTER — Other Ambulatory Visit: Payer: Self-pay

## 2022-06-10 ENCOUNTER — Other Ambulatory Visit (HOSPITAL_COMMUNITY): Payer: Self-pay

## 2022-06-10 VITALS — BP 142/79 | HR 67 | Temp 97.6°F | Resp 16 | Ht 68.0 in | Wt 129.7 lb

## 2022-06-10 DIAGNOSIS — C880 Waldenstrom macroglobulinemia: Secondary | ICD-10-CM

## 2022-06-10 DIAGNOSIS — Z515 Encounter for palliative care: Secondary | ICD-10-CM

## 2022-06-10 DIAGNOSIS — K5903 Drug induced constipation: Secondary | ICD-10-CM

## 2022-06-10 DIAGNOSIS — G893 Neoplasm related pain (acute) (chronic): Secondary | ICD-10-CM

## 2022-06-10 DIAGNOSIS — F419 Anxiety disorder, unspecified: Secondary | ICD-10-CM

## 2022-06-10 DIAGNOSIS — R630 Anorexia: Secondary | ICD-10-CM

## 2022-06-10 DIAGNOSIS — R530 Neoplastic (malignant) related fatigue: Secondary | ICD-10-CM

## 2022-06-10 LAB — CMP (CANCER CENTER ONLY)
ALT: 10 U/L (ref 0–44)
AST: 13 U/L — ABNORMAL LOW (ref 15–41)
Albumin: 4.2 g/dL (ref 3.5–5.0)
Alkaline Phosphatase: 55 U/L (ref 38–126)
Anion gap: 6 (ref 5–15)
BUN: 11 mg/dL (ref 8–23)
CO2: 22 mmol/L (ref 22–32)
Calcium: 9.2 mg/dL (ref 8.9–10.3)
Chloride: 108 mmol/L (ref 98–111)
Creatinine: 0.75 mg/dL (ref 0.61–1.24)
GFR, Estimated: >60 mL/min (ref >60)
Glucose, Bld: 140 mg/dL — ABNORMAL HIGH (ref 70–99)
Potassium: 3.4 mmol/L — ABNORMAL LOW (ref 3.5–5.1)
Sodium: 136 mmol/L (ref 135–145)
Total Bilirubin: 0.5 mg/dL (ref 0.3–1.2)
Total Protein: 6 g/dL — ABNORMAL LOW (ref 6.5–8.1)

## 2022-06-10 LAB — CBC WITH DIFFERENTIAL (CANCER CENTER ONLY)
Abs Immature Granulocytes: 0.02 10*3/uL (ref 0.00–0.07)
Basophils Absolute: 0 10*3/uL (ref 0.0–0.1)
Basophils Relative: 0 %
Eosinophils Absolute: 0.1 10*3/uL (ref 0.0–0.5)
Eosinophils Relative: 1 %
HCT: 41.1 % (ref 39.0–52.0)
Hemoglobin: 14.1 g/dL (ref 13.0–17.0)
Immature Granulocytes: 0 %
Lymphocytes Relative: 16 %
Lymphs Abs: 0.8 10*3/uL (ref 0.7–4.0)
MCH: 27.6 pg (ref 26.0–34.0)
MCHC: 34.3 g/dL (ref 30.0–36.0)
MCV: 80.4 fL (ref 80.0–100.0)
Monocytes Absolute: 0.3 10*3/uL (ref 0.1–1.0)
Monocytes Relative: 5 %
Neutro Abs: 4 10*3/uL (ref 1.7–7.7)
Neutrophils Relative %: 78 %
Platelet Count: 228 10*3/uL (ref 150–400)
RBC: 5.11 MIL/uL (ref 4.22–5.81)
RDW: 14.5 % (ref 11.5–15.5)
WBC Count: 5.2 10*3/uL (ref 4.0–10.5)
nRBC: 0 % (ref 0.0–0.2)

## 2022-06-10 NOTE — Progress Notes (Signed)
[place] [phone]   [phone]  PROGRESS NOTE  Patient Care Team: Delene Ruffini, MD as PCP - General (Internal Medicine) Pickenpack-Cousar, Carlena Sax, NP as Nurse Practitioner (Nurse Practitioner)  Hematological/Oncological History # Waldenstrom's Macroglobulinemia # IgM Monoclonal Gammopathy 04/07/2021: CT A/p showed mildly enlarged retroperitoneal lymph nodes and bilateral inguinal lymph nodes.  04/08/2021: IgM Kappa M protein 1.2 04/09/2021: WBC 11.1, Hgb 9.5, MCV 75.4, Plt 525 04/20/2021: Kappa 977, Lambda 7.8, K/L ratio 125.28. Serum viscosity 1.8 05/06/2021: Establish care with Dr. 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 Infusion reaction with ritux, held halfway through.  06/12/2021: Cycle 2 Day 1 of Ibrutinib + Rituximab 06/19/2021: Cycle 3 Day 1 of Ibrutinib + Rituximab 06/26/2021: Cycle 4 Day 1 of rituximab. Continued on daily ibrutinib 12/02/2021: Cycle 5 Day 1 of Ibrutinib + Rituximab 12/09/2021: Cycle 6 Day 1 of Ibrutinib + Rituximab 12/16/2021: Cycle 7 Day 1 of Ibrutinib + Rituximab 12/23/2021: Held Cycle 8 of Ritxumab due to nausea/vomiting, elevated creatinine, finger infection  Interval History:  Connor Solomon 61 y.o. male with medical history significant for Waldenstrom's macroglobulinemia who presents for a follow up visit. The patient's last visit was on 05/04/2022 In the interim since the last visit he has continued on Ibrutinib therapy.   On exam today Connor Solomon reports has erythema and soreness of his left index finger but the other lesions on his hands from prior have resolved.  He reports that there is some pain and soreness at the site.  He is not having any oozing, blisters, or discharge.  He reports that he does continue to have nausea and  he did have an episode of vomiting this morning.  His prior episode was 2 days ago.  He notes that he continues to have back and hip pain but that his current dose of methadone is effective.  For his episodes of nausea the Zofran has been helping.  He continues to have poor energy and rates his energy is a 5 out of 10 at this time.  His pain is also about a 5 out of 10 at this time.  His wife is not currently with him today as she is babysitting her granddaughter.  He notes his appetite is still poor and he is doing his best to try to gain weight.  He was initially on dronabinol but due to insurance issues has not been able to afford this medication and has switched to mirtazapine.  It has not been as effective.  He reports that his pain is well controlled with current pain regimen with methadone 10 mg 3 times a day.He denies fevers, chills, night sweats, shortness of breath, chest pain or cough. He has no other complaints. Full 10 point ROS is listed below.   MEDICAL HISTORY:  Past Medical History:  Diagnosis Date   Acute heart failure ([place]) 04/08/2021   Acute HFrEF (heart failure with reduced ejection fraction) ([place]) 04/07/2021   Arthritis    hands, elbows bilaterally   Cancer ([place])    Full dentures    Hypertension    Pt states he doen not have HTN and has never been treated for HTN   Kidney stone on left side last stone 4-5 yrs ago   recurrent (3 episodes)   Panic disorder    pt states has resolved   Syncope  resolved- was related to panic attacks    SURGICAL HISTORY: Past Surgical History:  Procedure Laterality Date   CARPAL METACARPAL FUSION WITH DISTAL RADIAL BONE GRAFT Left 05/30/2013   Procedure: LEFT THUMB METACARPAL JOINT FUSION;  Surgeon: Schuyler Amor, MD;  Location: [facility];  Service: Orthopedics;  Laterality: Left;   ESOPHAGOGASTRODUODENOSCOPY N/A 02/12/2014   Procedure: ESOPHAGOGASTRODUODENOSCOPY (EGD);  Surgeon: Jerene Bears, MD;  Location: [facility]  ENDOSCOPY;  Service: Endoscopy;  Laterality: N/A;   FOREIGN BODY REMOVAL N/A 02/12/2014   Procedure: FOREIGN BODY REMOVAL;  Surgeon: Jerene Bears, MD;  Location: [facility];  Service: Endoscopy;  Laterality: N/A;   OPEN REDUCTION INTERNAL FIXATION (ORIF) DISTAL RADIAL FRACTURE Left 11/29/2012   Procedure: OPEN REDUCTION INTERNAL FIXATION (ORIF) DISTAL RADIAL FRACTURE;  Surgeon: Schuyler Amor, MD;  Location: [facility];  Service: Orthopedics;  Laterality: Left;  LEFT DISTAL RADIUS OSTEOTOMY WITH BONE GRAFT   RIGHT/LEFT HEART CATH AND CORONARY ANGIOGRAPHY N/A 04/09/2021   Procedure: RIGHT/LEFT HEART CATH AND CORONARY ANGIOGRAPHY;  Surgeon: Jolaine Artist, MD;  Location: [facility];  Service: Cardiovascular;  Laterality: N/A;   TENDON REPAIR Left 02/07/2013   Procedure: LEFT EXTENSOR INDICIS PROPRIUS  TO EXTENSOR POLLICIS LONGUS TENDON TRANSFER;  Surgeon: Schuyler Amor, MD;  Location: [facility];  Service: Orthopedics;  Laterality: Left;   WISDOM TOOTH EXTRACTION     WRIST SURGERY     fx only    SOCIAL HISTORY: Social History   Socioeconomic History   Marital status: Single    Spouse name: Not on file   Number of children: Not on file   Years of education: Not on file   Highest education level: Not on file  Occupational History   Not on file  Tobacco Use   Smoking status: Former    Types: Cigars    Quit date: 10/11/2020    Years since quitting: 1.6   Smokeless tobacco: Never   Tobacco comments:    smokes 1 cigar a week  Vaping Use   Vaping Use: Never used  Substance and Sexual Activity   Alcohol use: No   Drug use: No   Sexual activity: Not on file  Other Topics Concern   Not on file  Social History Narrative   Not on file   Social Determinants of Health   Financial Resource Strain: High Risk (07/17/2021)   Overall Financial Resource Strain (CARDIA)    Difficulty of Paying Living Expenses: Very hard  Food Insecurity: Food  Insecurity Present (04/10/2021)   Hunger Vital Sign    Worried About [place] in the Last Year: Sometimes true    Ran Out of Food in the Last Year: Sometimes true  Transportation Needs: No Transportation Needs (04/10/2021)   PRAPARE - Hydrologist (Medical): No    Lack of Transportation (Non-Medical): No  Physical Activity: Not on file  Stress: Stress Concern Present (09/01/2021)   [place]    Feeling of Stress : Very much  Social Connections: Not on file  Intimate Partner Violence: Not on file    FAMILY HISTORY: Family History  Problem Relation Age of Onset   Heart attack Father    Sudden Cardiac Death Father 80   Diabetes Neg Hx    Hyperlipidemia Neg Hx    Hypertension Neg Hx     ALLERGIES:  is allergic to rituxan [rituximab]  and hydrocodone.  MEDICATIONS:  Current Outpatient Medications  Medication Sig Dispense Refill   acetaminophen (TYLENOL) 500 MG tablet Take 500 mg by mouth every 6 (six) hours as needed.     albuterol (PROVENTIL HFA) 108 (90 Base) MCG/ACT inhaler Inhale 2 puffs into the lungs every 6 hours as needed for wheezing or shortness of breath 6.7 g 2   carvedilol (COREG) 3.125 MG tablet Take 1 tablet by mouth 2 times daily. 68 tablet 2   dronabinol (MARINOL) 2.5 MG capsule Take 1 capsule by mouth 2 times daily before a meal. 60 capsule 0   empagliflozin (JARDIANCE) 10 MG TABS tablet Take 1 tablet by mouth daily before breakfast. 30 tablet 11   eplerenone (INSPRA) 25 MG tablet Take 1/2 tablet (12.5 mg total) by mouth daily. 15 tablet 3   escitalopram (LEXAPRO) 5 MG tablet Take 1 tablet (5 mg total) by mouth daily. 30 tablet 0   ferrous sulfate 325 (65 FE) MG tablet Take 1 tablet by mouth daily with breakfast. Please take with a source of Vitamin C 90 tablet 3   furosemide (LASIX) 20 MG tablet Take 1 tablet (20 mg total) by mouth daily as needed for swelling 100  tablet 2   ibrutinib (IMBRUVICA) 420 MG tablet Take 1 tablet (420 mg total) by mouth daily. 30 tablet 2   megestrol (MEGACE) 20 MG tablet Take 1 tablet (20 mg total) by mouth daily. 30 tablet 1   methadone (DOLOPHINE) 10 MG tablet Take 1 tablet by mouth every 8 hours. 90 tablet 0   metoCLOPramide (REGLAN) 10 MG tablet Take 1 tablet (10 mg total) by mouth every 8 (eight) hours as needed for nausea. 30 tablet 1   naloxone (NARCAN) nasal spray 4 mg/0.1 mL Take for overdose 1 each 0   naloxone (NARCAN) nasal spray 4 mg/0.1 mL Take for overdose as directed 2 each 0   ondansetron (ZOFRAN) 8 MG tablet Take 1 tablet by mouth every 8 hours as needed. 30 tablet 0   Oxycodone HCl 10 MG TABS Take 1 tablet by mouth every 6 hours as needed. 60 tablet 0   potassium chloride SA (KLOR-CON M) 20 MEQ tablet Take 1 tablet (20 mEq total) by mouth daily. 10 tablet 0   prochlorperazine (COMPAZINE) 10 MG tablet Take 1 tablet by mouth every 6 hours as needed for nausea or vomiting. 60 tablet 1   sacubitril-valsartan (ENTRESTO) 24-26 MG Take 1 tablet by mouth 2 (two) times daily. 180 tablet 3   No current facility-administered medications for this visit.    REVIEW OF SYSTEMS:   Constitutional: ( - ) fevers, ( - )  chills , ( - ) night sweats Eyes: ( - ) blurriness of vision, ( - ) double vision, ( - ) watery eyes Ears, nose, mouth, throat, and face: ( - ) mucositis, ( - ) sore throat Respiratory: ( - ) cough, ( - ) dyspnea, ( - ) wheezes Cardiovascular: ( - ) palpitation, ( - ) chest discomfort, ( - ) lower extremity swelling Gastrointestinal:  ( + ) nausea, ( - ) heartburn, ( - ) change in bowel habits Skin: ( - ) abnormal skin rashes Lymphatics: ( - ) new lymphadenopathy, ( - ) easy bruising Neurological: ( - ) numbness, ( - ) tingling, ( - ) new weaknesses Behavioral/Psych: ( - ) mood change, ( - ) new changes  All other systems were reviewed with the patient and are negative.  PHYSICAL EXAMINATION: ECOG  PERFORMANCE STATUS: 1 - Symptomatic but completely ambulatory  Vitals:   06/10/22 1059  BP: (!) 142/79  Pulse: 67  Resp: 16  Temp: 97.6 F (36.4 C)  SpO2: 100%     Filed Weights   06/10/22 1059  Weight: 129 lb 11.2 oz (58.8 kg)      GENERAL: Chronically ill appearing middle-aged Caucasian male, alert, no distress and comfortable SKIN: skin color, texture, turgor are normal, no rashes or significant lesions.  EYES: conjunctiva are pink and non-injected, sclera clear LUNGS: clear to auscultation and percussion with normal breathing effort HEART: regular rate & rhythm and no murmurs and no lower extremity edema  PSYCH: alert & oriented x 3, fluent speech NEURO: no focal motor/sensory deficits   LABORATORY DATA:  I have reviewed the data as listed    Latest Ref Rng & Units 06/10/2022   10:38 AM 05/04/2022    1:01 PM 04/06/2022   10:33 AM  CBC  WBC 4.0 - 10.5 K/uL 5.2  5.5  5.2   Hemoglobin 13.0 - 17.0 g/dL 14.1  13.2  13.8   Hematocrit 39.0 - 52.0 % 41.1  40.1  41.6   Platelets 150 - 400 K/uL 228  202  180        Latest Ref Rng & Units 06/10/2022   10:38 AM 05/04/2022    1:01 PM 04/06/2022   10:33 AM  CMP  Glucose 70 - 99 mg/dL 140  90  84   BUN 8 - 23 mg/dL $Remove'11  12  9   'UXCYQBi$ Creatinine 0.61 - 1.24 mg/dL 0.75  0.87  0.86   Sodium 135 - 145 mmol/L 136  136  135   Potassium 3.5 - 5.1 mmol/L 3.4  3.7  3.6   Chloride 98 - 111 mmol/L 108  105  104   CO2 22 - 32 mmol/L $RemoveB'22  26  26   'HFYuBKhr$ Calcium 8.9 - 10.3 mg/dL 9.2  8.5  8.7   Total Protein 6.5 - 8.1 g/dL 6.0  5.4  5.6   Total Bilirubin 0.3 - 1.2 mg/dL 0.5  0.5  0.4   Alkaline Phos 38 - 126 U/L 55  67  70   AST 15 - 41 U/L $Remo'13  17  15   'aNMhC$ ALT 0 - 44 U/L $Remo'10  8  8     'DAcff$ Lab Results  Component Value Date   MPROTEIN 0.1 (H) 05/04/2022   MPROTEIN 0.1 (H) 04/06/2022   MPROTEIN 0.1 (H) 03/10/2022   Lab Results  Component Value Date   KPAFRELGTCHN 26.3 (H) 05/04/2022   KPAFRELGTCHN 30.8 (H) 04/06/2022   KPAFRELGTCHN 34.5 (H)  03/10/2022   LAMBDASER 1.6 (L) 05/04/2022   LAMBDASER 4.2 (L) 04/06/2022   LAMBDASER 3.7 (L) 03/10/2022   KAPLAMBRATIO 16.44 (H) 05/04/2022   KAPLAMBRATIO 7.33 (H) 04/06/2022   KAPLAMBRATIO 9.32 (H) 03/10/2022    RADIOGRAPHIC STUDIES: No results found.  ASSESSMENT & PLAN Connor Solomon 61 y.o. male with medical history significant for Waldenstrom's macroglobulinemia who presents for a follow up visit.   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  There is no clear evidence of hyperviscosity syndrome at this time.  At this time we will plan to proceed with ibrutinib and rituximab therapy.  One could also consider Bendamustine and rituximab therapy but given the patient's degree of anemia I would prefer pursuing ibrutinib and rituximab at this  time.  Additionally ibrutinib/rituximab are category 1 recommendations per the NCCN guidelines.  The treatment will consist of ibrutinib 420 mg p.o. daily with rituximab on weeks 1 through 4 as well as weeks 27 through 30.  Previously we discussed the risks and benefits of therapy including (but not limited to) risk of bleeding, fatigue, atypical infections, and cardiac arrhythmias.  The patient voices understanding of this plan moving forward.   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 --Started second round of weekly rituximab on 12/02/2021. Continued weekly x 4 weeks.   --last rituximab dose held due to missed visits/illness.  Plan: --Labs today were reviewed without any intervention needed.  Labs show white  cell count 5.2, hemoglobin 14.1, MCV 80.4, and platelets of 228. Pending SPEP and sFLC levels but prior levels appear stable. --continues on ibrutinib 420 mg PO daily.  --RTC in 4 weeks with labs   # Hand/Foot/Mouth Blisters-resolved -- Findings appear to be most consistent with hand-foot-and-mouth disease --Symptoms resolved --Continue to monitor  #Pain Management --Pain was poorly controlled on hydrocodone therapy previously.  --Patient was previously admitted for for opioid overdose with presumed fentanyl --Currently pain regimen includes methadone 10 mg TID per palliative care.   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  Currently on PO ferrous sulfate to try and bolster his iron levels.  --Continue to monitor.  #Nausea and vomiting--improving -- Concern that the nausea and vomiting may not be related to the ibrutinib therapy and potentially tied to poor gastric motility due to opioid prescription.  --Unable to tolerate olanzopine so discontinued --Not taking metoclopramide 10 mg every 8 hours  --continue zofran PRN.   #Supportive Care -- chemotherapy education complete -- port placement not required   No orders of the defined types were placed in this encounter.  All questions were answered. The patient knows to call the clinic with any problems, questions or concerns.  I have spent a total of 30 minutes minutes of face-to-face and non-face-to-face time, preparing to see the patient,  performing a medically appropriate examination, counseling and educating the patient, ordering medications/tests, referring and communicating with other health care professionals, documenting clinical information in the electronic health record, and care coordination.   Ledell Peoples, MD Department of Hematology/Oncology [place] at [facility] Phone: [phone] Pager:  [phone] Email: [email or web address]  06/10/2022 3:46 PM  Dimopoulos MA, Rennie Natter, [place], Mahe B, Herbaux C, Tam C, Orsucci L, [place] ML, Matous JV, [place] C, Kastritis E, [place], Li J, Salman Z, Graef T, Buske C; iNNOVATE Study Group and the [employer] for [employer]. Phase 3 Trial of Ibrutinib plus Rituximab in Waldenstrm's Macroglobulinemia. Alison Stalling J Med. 2018 Jun 21;378(25):2399-2410.   --At 30 months, the progression-free survival rate was 82% with ibrutinib-rituximab versus 28% with placebo-rituximab (hazard ratio for progression or death, 0.20; P<0.001).

## 2022-06-10 NOTE — Progress Notes (Signed)
Connor Solomon  Telephone:(336) [phone] Fax:(336) [phone]   Name: Connor Solomon Date: 06/10/2022 MRN: [id number]  DOB: 12/23/1960  Patient Care Team: Delene Ruffini, MD as PCP - General (Internal Medicine) Pickenpack-Cousar, Carlena Sax, NP as Nurse Practitioner (Nurse Practitioner)     REASON FOR CONSULTATION: [name] is a 61 y.o. 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 Palliative ask to see for symptom management.  SOCIAL HISTORY:     reports that he quit smoking about 19 months ago. His smoking use included cigars. He has never used smokeless tobacco. He reports that he does not drink alcohol and does not use drugs.  ADVANCE DIRECTIVES:    CODE STATUS:   PAST MEDICAL HISTORY: Past Medical History:  Diagnosis Date   Acute heart failure ([place]) 04/08/2021   Acute HFrEF (heart failure with reduced ejection fraction) ([place]) 04/07/2021   Arthritis    hands, elbows bilaterally   Cancer (HCC)    Full dentures    Hypertension    Pt states he doen not have HTN and has never been treated for HTN   Kidney stone on left side last stone 4-5 yrs ago   recurrent (3 episodes)   Panic disorder    pt states has resolved   Syncope    resolved- was related to panic attacks    PAST SURGICAL HISTORY:  Past Surgical History:  Procedure Laterality Date   CARPAL METACARPAL FUSION WITH DISTAL RADIAL BONE GRAFT Left 05/30/2013   Procedure: LEFT THUMB METACARPAL JOINT FUSION;  Surgeon: Schuyler Amor, MD;  Location: [place];  Service: Orthopedics;  Laterality: Left;   ESOPHAGOGASTRODUODENOSCOPY N/A 02/12/2014   Procedure: ESOPHAGOGASTRODUODENOSCOPY (EGD);  Surgeon: Jerene Bears, MD;  Location: [facility] ENDOSCOPY;  Service: Endoscopy;  Laterality: N/A;   FOREIGN BODY REMOVAL N/A 02/12/2014   Procedure: FOREIGN BODY REMOVAL;  Surgeon: Jerene Bears, MD;  Location: [place];  Service: Endoscopy;  Laterality: N/A;   OPEN REDUCTION INTERNAL FIXATION (ORIF) DISTAL RADIAL FRACTURE Left 11/29/2012   Procedure: OPEN REDUCTION INTERNAL FIXATION (ORIF) DISTAL RADIAL FRACTURE;  Surgeon: Schuyler Amor, MD;  Location: [place];  Service: Orthopedics;  Laterality: Left;  LEFT DISTAL RADIUS OSTEOTOMY WITH BONE GRAFT   RIGHT/LEFT HEART CATH AND CORONARY ANGIOGRAPHY N/A 04/09/2021   Procedure: RIGHT/LEFT HEART CATH AND CORONARY ANGIOGRAPHY;  Surgeon: Jolaine Artist, MD;  Location: [place] [facility];  Service: Cardiovascular;  Laterality: N/A;   TENDON REPAIR Left 02/07/2013   Procedure: LEFT EXTENSOR INDICIS PROPRIUS  TO EXTENSOR POLLICIS LONGUS TENDON TRANSFER;  Surgeon: Schuyler Amor, MD;  Location: [place];  Service: Orthopedics;  Laterality: Left;   WISDOM TOOTH EXTRACTION     WRIST SURGERY     fx only    HEMATOLOGY/ONCOLOGY HISTORY:  Oncology History  Waldenstrom macroglobulinemia ([place])  05/24/2021 Initial Diagnosis   Waldenstrom macroglobulinemia ([place])   06/05/2021 -  Chemotherapy   Patient is on Treatment Plan : NON-HODGKINS LYMPHOMA Rituximab q7d       ALLERGIES:  is allergic to rituxan [rituximab] and hydrocodone.  MEDICATIONS:  Current Outpatient Medications  Medication Sig Dispense Refill   acetaminophen (TYLENOL) 500 MG tablet Take 500 mg by mouth every 6 (six) hours as needed.     albuterol (PROVENTIL HFA) 108 (90 Base) MCG/ACT inhaler Inhale 2 puffs into the lungs every 6 hours as needed  for wheezing or shortness of breath 6.7 g 2   carvedilol (COREG) 3.125 MG tablet Take 1 tablet by mouth 2 times daily. 68 tablet 2   dronabinol (MARINOL) 2.5 MG capsule Take 1 capsule by mouth 2 times daily before a meal. 60 capsule 0   empagliflozin (JARDIANCE) 10 MG TABS tablet Take 1 tablet by mouth daily before breakfast. 30 tablet 11   eplerenone (INSPRA) 25 MG tablet Take 1/2 tablet  (12.5 mg total) by mouth daily. 15 tablet 3   escitalopram (LEXAPRO) 5 MG tablet Take 1 tablet (5 mg total) by mouth daily. 30 tablet 0   ferrous sulfate 325 (65 FE) MG tablet Take 1 tablet by mouth daily with breakfast. Please take with a source of Vitamin C 90 tablet 3   furosemide (LASIX) 20 MG tablet Take 1 tablet (20 mg total) by mouth daily as needed for swelling 100 tablet 2   ibrutinib (IMBRUVICA) 420 MG tablet Take 1 tablet (420 mg total) by mouth daily. 30 tablet 2   megestrol (MEGACE) 20 MG tablet Take 1 tablet (20 mg total) by mouth daily. 30 tablet 1   methadone (DOLOPHINE) 10 MG tablet Take 1 tablet by mouth every 8 hours. 90 tablet 0   metoCLOPramide (REGLAN) 10 MG tablet Take 1 tablet (10 mg total) by mouth every 8 (eight) hours as needed for nausea. 30 tablet 1   naloxone (NARCAN) nasal spray 4 mg/0.1 mL Take for overdose 1 each 0   naloxone (NARCAN) nasal spray 4 mg/0.1 mL Take for overdose as directed 2 each 0   ondansetron (ZOFRAN) 8 MG tablet Take 1 tablet by mouth every 8 hours as needed. 30 tablet 0   Oxycodone HCl 10 MG TABS Take 1 tablet by mouth every 6 hours as needed. 60 tablet 0   potassium chloride SA (KLOR-CON M) 20 MEQ tablet Take 1 tablet (20 mEq total) by mouth daily. 10 tablet 0   prochlorperazine (COMPAZINE) 10 MG tablet Take 1 tablet by mouth every 6 hours as needed for nausea or vomiting. 60 tablet 1   sacubitril-valsartan (ENTRESTO) 24-26 MG Take 1 tablet by mouth 2 (two) times daily. 180 tablet 3   No current facility-administered medications for this visit.    VITAL SIGNS: There were no vitals taken for this visit. There were no vitals filed for this visit.   Estimated body mass index is 19.72 kg/m as calculated from the following:   Height as of an earlier encounter on 06/10/22: 5\' 8"  (1.727 m).   Weight as of an earlier encounter on 06/10/22: 129 lb 11.2 oz (58.8 kg).   PERFORMANCE STATUS (ECOG) : 2 - Symptomatic, <50% confined to  bed   IMPRESSION:  Mr. [name] presented to the clinic today for close symptom management follow-up. Is not feeling the best today. Some of his fingernails have fallen off and he has skin peeling to left index finger. Reporting increase in joint pain.   We discussed his medications. Unfortunately he is having some financial distress after losing his medicaid. We discussed charitable means. I also encouraged him to speak back with his medicaid worker for further clarification and consideration of his medical bills and needs.    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 He is having some increase in joint pain lately.  He is tolerating Methadone 10mg  three times daily. Denies drowsiness. He is able to afford as this is one of the cheaper options.   We will plan to continue close  monitoring and adjust as needed.   Constipation Reports daily or every other day bowel movements. Education provided on opioid induced constipation. Recommended Miralax daily. If no bowel movement in 48 hours he knows to increase to twice daily.   Appetite/decreased appetite.  Braelin is concerned about not being able to afford his marinol. Reports he was sleeping much better and his appetite had improved. We tried megace however he does not feel this has helped him much as marino.   Is trying to drink at least 1-2 ensure daily for additional support.   Weight  is stable at 129lbs he was 130lbs last visit.  He was134 lbs  6/7, 139lbs 5/16 up from 131lbs 4/20, down from 147.8lbs on 3/22.   Education provided on eating small frequent meals versus focusing on 3 large meals per day. Protein enriched foots. Eating when he has a desire. Denies nausea/vomiting. ll continue to closely monitor.   He has an appetite at times. Other times when he has a desire once he begins eating interest diminishes. Education provided on appetite stimulant.   We will continue to closely monitor.   4. Anxiety/Anxiousness  He has not  been taking his escitalopram. Reports he has become more anxious and does not like how it makes him feel. It was effective initially. Not really interested in dose adjustment. Could consider valium as additional options.    Reef was started on escitalopram during office visit on 5/31. Dose was increased to $RemoveBefo'10mg'oiyPjndTQmJ$  on 6/14. Tolerating well. Encouraged to continue with medication as prescribed allowing ongoing monitoring and adjustments as needed. He verbalized understanding and appreciation.   Avrohom shares concerns with ongoing anxiety and anxiousness. States his mind sometimes wanders making him have increased anxiety thinking about the unknown and what ifs. Emotional support provided. He took one of his relative's Xanax and felt this helped him tremendously. Education provided on importance of not taking medications not prescribed as this could interfere with other medications or cause complications. We also discussed concerns with certain medications which could contribute to respiratory depression, lethargy in correlation with his pain medications.   He is emotional regarding his anxiety. Wife expresses concerns over this as well. Education provided on use of more long-term medication which can provide similar relief as Xanax. We will plan to start on Lexapro daily. Education provided on side effects in addition to importance on not stopping abruptly and taking as prescribed. He understands it may take some time for effectiveness and will not be an immediate response.   We will closely monitor.   5. Goals of care (5/16) Mr. Cheese shares his realistic understanding of his condition. He and his wife are making sure all "affairs" are in order as his biggest worry is leaving his wife in a financial constraint. Reports communicating with his life insurance policy etc. Emotional support provided.   He is emotional expressing racing thoughts during idol time or interruptions in his sleep as his mind wonders  thinking about worst case scenario. Is requesting something to assist with sleeping and anxiety. Acknowledged his request. Education provided on limited use in collaboration with other medications including zyprexa. He verbalized understanding and aware  we will continue to support and monitor for needs.    PLAN: methadone 10 mg every 8 hours. Close monitoring. Tolerating well. Pain improved.  Oxycodone 10 mg as needed for breakthrough pain.  Miralax daily for constipation Will consider valium for anxiety management.  Zofran as needed for nausea as prescribed.  Will switch  to magace $RemoveB'20mg'EpwitspV$  daily for appetite stimulant. Unable to afford marinol due to loss of insurance. Not effective.  I will plan to see patient back in 3-4 weeks in collaboration to other oncology appointments.    Patient expressed understanding and was in agreement with this plan. He also understands that He can call the clinic at any time with any questions, concerns, or complaints.   Thank you for your referral and allowing Palliative to assist in Mr. Imanuel Pruiett [facility] care.     Any controlled substances utilized were prescribed in the context of palliative care. PDMP has been reviewed.    Time Total: 30 min   Visit consisted of counseling and education dealing with the complex and emotionally intense issues of symptom management and palliative care in the setting of serious and potentially life-threatening illness.Greater than 50%  of this time was spent counseling and coordinating care related to the above assessment and plan.  Alda Lea, AGPCNP-BC  Palliative Medicine Team/[place]

## 2022-06-11 LAB — KAPPA/LAMBDA LIGHT CHAINS
Kappa free light chain: 21.4 mg/L — ABNORMAL HIGH (ref 3.3–19.4)
Kappa, lambda light chain ratio: 5.22 — ABNORMAL HIGH (ref 0.26–1.65)
Lambda free light chains: 4.1 mg/L — ABNORMAL LOW (ref 5.7–26.3)

## 2022-06-12 ENCOUNTER — Other Ambulatory Visit (HOSPITAL_COMMUNITY): Payer: Self-pay

## 2022-06-12 ENCOUNTER — Other Ambulatory Visit (HOSPITAL_COMMUNITY): Payer: Self-pay | Admitting: Family Medicine

## 2022-06-12 ENCOUNTER — Other Ambulatory Visit: Payer: Self-pay | Admitting: Hematology and Oncology

## 2022-06-15 ENCOUNTER — Other Ambulatory Visit (HOSPITAL_COMMUNITY): Payer: Self-pay

## 2022-06-15 LAB — MULTIPLE MYELOMA PANEL, SERUM
Albumin SerPl Elph-Mcnc: 3.5 g/dL (ref 2.9–4.4)
Albumin/Glob SerPl: 2.1 — ABNORMAL HIGH (ref 0.7–1.7)
Alpha 1: 0.2 g/dL (ref 0.0–0.4)
Alpha2 Glob SerPl Elph-Mcnc: 0.7 g/dL (ref 0.4–1.0)
B-Globulin SerPl Elph-Mcnc: 0.7 g/dL (ref 0.7–1.3)
Gamma Glob SerPl Elph-Mcnc: 0.2 g/dL — ABNORMAL LOW (ref 0.4–1.8)
Globulin, Total: 1.7 g/dL — ABNORMAL LOW (ref 2.2–3.9)
IgA: 9 mg/dL — ABNORMAL LOW (ref 61–437)
IgG (Immunoglobin G), Serum: 119 mg/dL — ABNORMAL LOW (ref 603–1613)
IgM (Immunoglobulin M), Srm: 120 mg/dL (ref 20–172)
M Protein SerPl Elph-Mcnc: 0.1 g/dL — ABNORMAL HIGH
Total Protein ELP: 5.2 g/dL — ABNORMAL LOW (ref 6.0–8.5)

## 2022-06-15 MED ORDER — CARVEDILOL 3.125 MG PO TABS
3.1250 mg | ORAL_TABLET | Freq: Two times a day (BID) | ORAL | 6 refills | Status: DC
  Filled 2022-06-15: qty 60, 30d supply, fill #0
  Filled 2022-07-11: qty 60, 30d supply, fill #1
  Filled 2022-08-11: qty 60, 30d supply, fill #2
  Filled 2022-09-04: qty 60, 30d supply, fill #3
  Filled 2022-10-07: qty 60, 30d supply, fill #4
  Filled 2022-11-06: qty 60, 30d supply, fill #5
  Filled 2022-12-06: qty 60, 30d supply, fill #6

## 2022-06-16 ENCOUNTER — Encounter: Payer: Self-pay | Admitting: Licensed Clinical Social Worker

## 2022-06-16 DIAGNOSIS — C880 Waldenstrom macroglobulinemia: Secondary | ICD-10-CM

## 2022-06-16 NOTE — Progress Notes (Signed)
[place] CSW Progress Note  Clinical Education officer, museum  applied for a $500 grant through [employer] on behalf of pt.  Pt approved and should expect to receive a check in 3 to 7 business days.    Pt also advised Athena Masse is available for counseling and the care giver group, which is facilitated by Alli, would be a good fit for his wife.  Pt verbalized agreement w/ an appointment to see Alli.  Appointment to be booked.  CSW to continue to follow as appropriate throughout duration of treatment.      Henriette Combs, LCSW

## 2022-06-18 ENCOUNTER — Other Ambulatory Visit (HOSPITAL_COMMUNITY): Payer: Self-pay

## 2022-06-18 ENCOUNTER — Telehealth (HOSPITAL_COMMUNITY): Payer: Self-pay

## 2022-06-18 NOTE — Telephone Encounter (Signed)
Advanced Heart Failure Patient Advocate Encounter   Prior authorization is required for Jardiance $RemoveBefor'10MG'cMcwoVIUnqPo$   Submitted: 06/18/2022  Key [place], CPhT Rx Patient Advocate Phone: [phone]

## 2022-06-19 ENCOUNTER — Other Ambulatory Visit (HOSPITAL_COMMUNITY): Payer: Self-pay

## 2022-06-21 ENCOUNTER — Other Ambulatory Visit: Payer: Self-pay | Admitting: Nurse Practitioner

## 2022-06-21 ENCOUNTER — Other Ambulatory Visit (HOSPITAL_COMMUNITY): Payer: Self-pay

## 2022-06-21 DIAGNOSIS — Z515 Encounter for palliative care: Secondary | ICD-10-CM

## 2022-06-21 DIAGNOSIS — G893 Neoplasm related pain (acute) (chronic): Secondary | ICD-10-CM

## 2022-06-21 DIAGNOSIS — R110 Nausea: Secondary | ICD-10-CM

## 2022-06-21 DIAGNOSIS — C8800 Waldenstrom macroglobulinemia not having achieved remission: Secondary | ICD-10-CM

## 2022-06-21 MED ORDER — OXYCODONE HCL 10 MG PO TABS
10.0000 mg | ORAL_TABLET | Freq: Four times a day (QID) | ORAL | 0 refills | Status: DC | PRN
  Filled 2022-06-22: qty 60, 15d supply, fill #0

## 2022-06-22 ENCOUNTER — Other Ambulatory Visit (HOSPITAL_COMMUNITY): Payer: Self-pay

## 2022-06-22 ENCOUNTER — Encounter (HOSPITAL_COMMUNITY): Payer: Self-pay

## 2022-06-22 NOTE — Telephone Encounter (Signed)
Advanced Heart Failure Patient Advocate Encounter  Received a fax from [employer] regarding Prior Authorization for [place].   Authorization has been DENIED due to step therapy requirements. This insurance plan prefers [place], and [place] has been approved from 06/21/2022 to 06/22/2023.  Clista Bernhardt, CPhT Rx Patient Advocate Phone: [phone]

## 2022-06-29 ENCOUNTER — Inpatient Hospital Stay: Payer: Self-pay | Attending: Hematology and Oncology

## 2022-06-29 DIAGNOSIS — I509 Heart failure, unspecified: Secondary | ICD-10-CM | POA: Insufficient documentation

## 2022-06-29 DIAGNOSIS — D472 Monoclonal gammopathy: Secondary | ICD-10-CM | POA: Insufficient documentation

## 2022-06-29 DIAGNOSIS — C880 Waldenstrom macroglobulinemia: Secondary | ICD-10-CM | POA: Insufficient documentation

## 2022-06-29 DIAGNOSIS — G893 Neoplasm related pain (acute) (chronic): Secondary | ICD-10-CM | POA: Insufficient documentation

## 2022-06-29 DIAGNOSIS — I10 Essential (primary) hypertension: Secondary | ICD-10-CM | POA: Insufficient documentation

## 2022-06-29 DIAGNOSIS — Z79899 Other long term (current) drug therapy: Secondary | ICD-10-CM | POA: Insufficient documentation

## 2022-06-29 DIAGNOSIS — K5900 Constipation, unspecified: Secondary | ICD-10-CM | POA: Insufficient documentation

## 2022-06-29 DIAGNOSIS — K5903 Drug induced constipation: Secondary | ICD-10-CM | POA: Insufficient documentation

## 2022-06-29 DIAGNOSIS — T381X5A Adverse effect of thyroid hormones and substitutes, initial encounter: Secondary | ICD-10-CM | POA: Insufficient documentation

## 2022-07-01 ENCOUNTER — Other Ambulatory Visit (HOSPITAL_COMMUNITY): Payer: Self-pay

## 2022-07-01 ENCOUNTER — Other Ambulatory Visit: Payer: Self-pay | Admitting: Nurse Practitioner

## 2022-07-01 DIAGNOSIS — Z515 Encounter for palliative care: Secondary | ICD-10-CM

## 2022-07-01 DIAGNOSIS — G893 Neoplasm related pain (acute) (chronic): Secondary | ICD-10-CM

## 2022-07-01 DIAGNOSIS — R110 Nausea: Secondary | ICD-10-CM

## 2022-07-01 DIAGNOSIS — C880 Waldenstrom macroglobulinemia: Secondary | ICD-10-CM

## 2022-07-01 MED ORDER — OXYCODONE HCL 10 MG PO TABS
10.0000 mg | ORAL_TABLET | Freq: Four times a day (QID) | ORAL | 0 refills | Status: DC | PRN
  Filled ????-??-??: fill #0

## 2022-07-02 ENCOUNTER — Other Ambulatory Visit (HOSPITAL_COMMUNITY): Payer: Self-pay

## 2022-07-02 ENCOUNTER — Other Ambulatory Visit: Payer: Self-pay

## 2022-07-02 DIAGNOSIS — Z515 Encounter for palliative care: Secondary | ICD-10-CM

## 2022-07-02 DIAGNOSIS — C880 Waldenstrom macroglobulinemia: Secondary | ICD-10-CM

## 2022-07-02 DIAGNOSIS — R110 Nausea: Secondary | ICD-10-CM

## 2022-07-02 DIAGNOSIS — G893 Neoplasm related pain (acute) (chronic): Secondary | ICD-10-CM

## 2022-07-02 MED ORDER — OXYCODONE HCL 10 MG PO TABS
10.0000 mg | ORAL_TABLET | ORAL | 0 refills | Status: DC | PRN
  Filled 2022-07-02: qty 90, 15d supply, fill #0

## 2022-07-02 MED ORDER — DAPAGLIFLOZIN PROPANEDIOL 10 MG PO TABS
10.0000 mg | ORAL_TABLET | Freq: Every day | ORAL | 11 refills | Status: DC
  Filled 2022-07-02: qty 30, 30d supply, fill #0
  Filled 2022-07-29: qty 30, 30d supply, fill #1
  Filled 2022-08-31: qty 30, 30d supply, fill #2
  Filled 2022-09-26 – 2022-10-07 (×3): qty 30, 30d supply, fill #3
  Filled 2022-10-28 – 2022-11-06 (×2): qty 30, 30d supply, fill #4
  Filled 2022-12-06 – 2023-04-26 (×13): qty 30, 30d supply, fill #5
  Filled 2023-06-28: qty 30, 30d supply, fill #6

## 2022-07-02 NOTE — Telephone Encounter (Signed)
Pt wife called stating pt is out of pain meds, see new orders.

## 2022-07-05 ENCOUNTER — Other Ambulatory Visit (HOSPITAL_COMMUNITY): Payer: Self-pay

## 2022-07-06 ENCOUNTER — Other Ambulatory Visit (HOSPITAL_COMMUNITY): Payer: Self-pay

## 2022-07-06 ENCOUNTER — Inpatient Hospital Stay (HOSPITAL_BASED_OUTPATIENT_CLINIC_OR_DEPARTMENT_OTHER): Payer: Self-pay | Admitting: Nurse Practitioner

## 2022-07-06 ENCOUNTER — Inpatient Hospital Stay (HOSPITAL_BASED_OUTPATIENT_CLINIC_OR_DEPARTMENT_OTHER): Payer: Self-pay | Admitting: Hematology and Oncology

## 2022-07-06 ENCOUNTER — Encounter: Payer: Self-pay | Admitting: Nurse Practitioner

## 2022-07-06 ENCOUNTER — Inpatient Hospital Stay: Payer: Self-pay

## 2022-07-06 ENCOUNTER — Other Ambulatory Visit: Payer: Self-pay

## 2022-07-06 VITALS — BP 100/58 | HR 68 | Temp 97.5°F | Resp 18 | Ht 68.0 in | Wt 127.9 lb

## 2022-07-06 DIAGNOSIS — G893 Neoplasm related pain (acute) (chronic): Secondary | ICD-10-CM

## 2022-07-06 DIAGNOSIS — R630 Anorexia: Secondary | ICD-10-CM

## 2022-07-06 DIAGNOSIS — C880 Waldenstrom macroglobulinemia: Secondary | ICD-10-CM

## 2022-07-06 DIAGNOSIS — Z515 Encounter for palliative care: Secondary | ICD-10-CM

## 2022-07-06 DIAGNOSIS — K5903 Drug induced constipation: Secondary | ICD-10-CM

## 2022-07-06 DIAGNOSIS — R530 Neoplastic (malignant) related fatigue: Secondary | ICD-10-CM

## 2022-07-06 LAB — CMP (CANCER CENTER ONLY)
ALT: 8 U/L (ref 0–44)
AST: 12 U/L — ABNORMAL LOW (ref 15–41)
Albumin: 3 g/dL — ABNORMAL LOW (ref 3.5–5.0)
Alkaline Phosphatase: 70 U/L (ref 38–126)
Anion gap: 7 (ref 5–15)
BUN: 14 mg/dL (ref 8–23)
CO2: 24 mmol/L (ref 22–32)
Calcium: 8.3 mg/dL — ABNORMAL LOW (ref 8.9–10.3)
Chloride: 105 mmol/L (ref 98–111)
Creatinine: 0.82 mg/dL (ref 0.61–1.24)
GFR, Estimated: >60 mL/min (ref >60)
Glucose, Bld: 93 mg/dL (ref 70–99)
Potassium: 2.9 mmol/L — ABNORMAL LOW (ref 3.5–5.1)
Sodium: 136 mmol/L (ref 135–145)
Total Bilirubin: 0.6 mg/dL (ref 0.3–1.2)
Total Protein: 5.4 g/dL — ABNORMAL LOW (ref 6.5–8.1)

## 2022-07-06 LAB — CBC WITH DIFFERENTIAL (CANCER CENTER ONLY)
Abs Immature Granulocytes: 0.03 10*3/uL (ref 0.00–0.07)
Basophils Absolute: 0 10*3/uL (ref 0.0–0.1)
Basophils Relative: 0 %
Eosinophils Absolute: 0.2 10*3/uL (ref 0.0–0.5)
Eosinophils Relative: 4 %
HCT: 30.5 % — ABNORMAL LOW (ref 39.0–52.0)
Hemoglobin: 10.4 g/dL — ABNORMAL LOW (ref 13.0–17.0)
Immature Granulocytes: 1 %
Lymphocytes Relative: 17 %
Lymphs Abs: 0.9 10*3/uL (ref 0.7–4.0)
MCH: 27.7 pg (ref 26.0–34.0)
MCHC: 34.1 g/dL (ref 30.0–36.0)
MCV: 81.3 fL (ref 80.0–100.0)
Monocytes Absolute: 1 10*3/uL (ref 0.1–1.0)
Monocytes Relative: 18 %
Neutro Abs: 3.2 10*3/uL (ref 1.7–7.7)
Neutrophils Relative %: 60 %
Platelet Count: 269 10*3/uL (ref 150–400)
RBC: 3.75 MIL/uL — ABNORMAL LOW (ref 4.22–5.81)
RDW: 15.3 % (ref 11.5–15.5)
WBC Count: 5.4 10*3/uL (ref 4.0–10.5)
nRBC: 0 % (ref 0.0–0.2)

## 2022-07-06 MED ORDER — POTASSIUM CHLORIDE CRYS ER 20 MEQ PO TBCR
20.0000 meq | EXTENDED_RELEASE_TABLET | Freq: Every day | ORAL | 1 refills | Status: DC
  Filled 2022-07-06: qty 30, 30d supply, fill #0
  Filled 2022-08-05: qty 30, 30d supply, fill #1

## 2022-07-06 NOTE — Progress Notes (Signed)
[place]  Telephone:(336) [phone] Fax:(336) [phone]   Name: Connor Solomon Date: 07/06/2022 MRN: [id number]  DOB: September 20, 1961  Patient Care Team: Delene Ruffini, MD as PCP - General (Internal Medicine) Pickenpack-Cousar, Carlena Sax, NP as Nurse Practitioner (Nurse Practitioner)     REASON FOR CONSULTATION: Connor Solomon is a 61 y.o. 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 Palliative ask to see for symptom management.  SOCIAL HISTORY:     reports that he quit smoking about 20 months ago. His smoking use included cigars. He has never used smokeless tobacco. He reports that he does not drink alcohol and does not use drugs.  ADVANCE DIRECTIVES:    CODE STATUS:   PAST MEDICAL HISTORY: Past Medical History:  Diagnosis Date   Acute heart failure ([place]) 04/08/2021   Acute HFrEF (heart failure with reduced ejection fraction) ([place]) 04/07/2021   Arthritis    hands, elbows bilaterally   Cancer (HCC)    Full dentures    Hypertension    Pt states he doen not have HTN and has never been treated for HTN   Kidney stone on left side last stone 4-5 yrs ago   recurrent (3 episodes)   Panic disorder    pt states has resolved   Syncope    resolved- was related to panic attacks    PAST SURGICAL HISTORY:  Past Surgical History:  Procedure Laterality Date   CARPAL METACARPAL FUSION WITH DISTAL RADIAL BONE GRAFT Left 05/30/2013   Procedure: LEFT THUMB METACARPAL JOINT FUSION;  Surgeon: Schuyler Amor, MD;  Location: [place];  Service: Orthopedics;  Laterality: Left;   ESOPHAGOGASTRODUODENOSCOPY N/A 02/12/2014   Procedure: ESOPHAGOGASTRODUODENOSCOPY (EGD);  Surgeon: Jerene Bears, MD;  Location: [facility] ENDOSCOPY;  Service: Endoscopy;  Laterality: N/A;   FOREIGN BODY REMOVAL N/A 02/12/2014   Procedure: FOREIGN BODY REMOVAL;  Surgeon: Jerene Bears, MD;  Location: [place];  Service: Endoscopy;  Laterality: N/A;   OPEN REDUCTION INTERNAL FIXATION (ORIF) DISTAL RADIAL FRACTURE Left 11/29/2012   Procedure: OPEN REDUCTION INTERNAL FIXATION (ORIF) DISTAL RADIAL FRACTURE;  Surgeon: Schuyler Amor, MD;  Location: [place];  Service: Orthopedics;  Laterality: Left;  LEFT DISTAL RADIUS OSTEOTOMY WITH BONE GRAFT   RIGHT/LEFT HEART CATH AND CORONARY ANGIOGRAPHY N/A 04/09/2021   Procedure: RIGHT/LEFT HEART CATH AND CORONARY ANGIOGRAPHY;  Surgeon: Jolaine Artist, MD;  Location: [place] [facility];  Service: Cardiovascular;  Laterality: N/A;   TENDON REPAIR Left 02/07/2013   Procedure: LEFT EXTENSOR INDICIS PROPRIUS  TO EXTENSOR POLLICIS LONGUS TENDON TRANSFER;  Surgeon: Schuyler Amor, MD;  Location: [place];  Service: Orthopedics;  Laterality: Left;   WISDOM TOOTH EXTRACTION     WRIST SURGERY     fx only    HEMATOLOGY/ONCOLOGY HISTORY:  Oncology History  Waldenstrom macroglobulinemia ([place])  05/24/2021 Initial Diagnosis   Waldenstrom macroglobulinemia ([place])   06/05/2021 - 12/30/2021 Chemotherapy   Patient is on Treatment Plan : NON-HODGKINS LYMPHOMA Rituximab q7d       ALLERGIES:  is allergic to rituxan [rituximab] and hydrocodone.  MEDICATIONS:  Current Outpatient Medications  Medication Sig Dispense Refill   acetaminophen (TYLENOL) 500 MG tablet Take 500 mg by mouth every 6 (six) hours as needed.     albuterol (PROVENTIL HFA) 108 (90 Base) MCG/ACT inhaler Inhale 2 puffs into the lungs every 6 hours as needed  for wheezing or shortness of breath 6.7 g 2   carvedilol (COREG) 3.125 MG tablet Take 1 tablet by mouth 2 times daily. 60 tablet 6   dapagliflozin propanediol (FARXIGA) 10 MG TABS tablet Take 1 tablet (10 mg total) by mouth daily before breakfast. 30 tablet 11   dronabinol (MARINOL) 2.5 MG capsule Take 1 capsule by mouth 2 times daily before a meal. 60 capsule 0   eplerenone  (INSPRA) 25 MG tablet Take 1/2 tablet (12.5 mg total) by mouth daily. 15 tablet 3   escitalopram (LEXAPRO) 5 MG tablet Take 1 tablet (5 mg total) by mouth daily. 30 tablet 0   ferrous sulfate 325 (65 FE) MG tablet Take 1 tablet by mouth daily with breakfast. Please take with a source of Vitamin C 90 tablet 3   furosemide (LASIX) 20 MG tablet Take 1 tablet (20 mg total) by mouth daily as needed for swelling 100 tablet 2   ibrutinib (IMBRUVICA) 420 MG tablet Take 1 tablet (420 mg total) by mouth daily. 30 tablet 2   megestrol (MEGACE) 20 MG tablet Take 1 tablet (20 mg total) by mouth daily. 30 tablet 1   methadone (DOLOPHINE) 10 MG tablet Take 1 tablet by mouth every 8 hours. 90 tablet 0   metoCLOPramide (REGLAN) 10 MG tablet Take 1 tablet (10 mg total) by mouth every 8 (eight) hours as needed for nausea. 30 tablet 1   naloxone (NARCAN) nasal spray 4 mg/0.1 mL Take for overdose 1 each 0   naloxone (NARCAN) nasal spray 4 mg/0.1 mL Take for overdose as directed 2 each 0   ondansetron (ZOFRAN) 8 MG tablet Take 1 tablet by mouth every 8 hours as needed. 30 tablet 0   Oxycodone HCl 10 MG TABS Take 1 tablet (10 mg total) by mouth every 4 (four) hours as needed. 90 tablet 0   potassium chloride SA (KLOR-CON M) 20 MEQ tablet Take 1 tablet (20 mEq total) by mouth daily. 10 tablet 0   prochlorperazine (COMPAZINE) 10 MG tablet Take 1 tablet by mouth every 6 hours as needed for nausea or vomiting. 60 tablet 1   sacubitril-valsartan (ENTRESTO) 24-26 MG Take 1 tablet by mouth 2 (two) times daily. 180 tablet 3   No current facility-administered medications for this visit.    VITAL SIGNS: BP (!) 100/58 Comment: manual  Pulse 68   Temp (!) 97.5 F (36.4 C) (Oral)   Resp 18   Ht 5\' 8"  (1.727 m)   Wt 127 lb 14.4 oz (58 kg)   SpO2 100%   BMI 19.45 kg/m  Filed Weights   07/06/22 1029  Weight: 127 lb 14.4 oz (58 kg)     Estimated body mass index is 19.45 kg/m as calculated from the following:   Height  as of this encounter: 5\' 8"  (1.727 m).   Weight as of this encounter: 127 lb 14.4 oz (58 kg).   PERFORMANCE STATUS (ECOG) : 2 - Symptomatic, <50% confined to bed   IMPRESSION:  [name] presented to the clinic today for close symptom management follow-up. His wife is also present with him. No acute distress. Ambulatory. Hands and feet improving. Blood pressure on the lower side today. He does not have a home cuff. Is taking antihypertensive. Advise need to closely monitor and follow-up with PCP/Cardiologist as appropriate. Rechecked manually with some improvement.    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 He is tolerating Methadone 10mg  three times daily. Denies drowsiness. Oxycodone 10mg  as needed  for breakthrough pain.   We will plan to continue close monitoring and adjust as needed.   Constipation Reports daily or every other day bowel movements. Education provided on opioid induced constipation. Recommended Miralax daily. If no bowel movement in 48 hours he knows to increase to twice daily.   Appetite/decreased appetite.  [place] is concerned about not being able to afford his marinol. Reports he was sleeping much better and his appetite had improved. We tried megace however he does not feel this has helped him much as marino.   Is trying to drink at least 1-2 ensure daily for additional support.   Weight  is stable at 127lbs he was 130lbs last visit.  He was134 lbs  6/7, 139lbs 5/16 up from 131lbs 4/20, down from 147.8lbs on 3/22.   Education provided on eating small frequent meals versus focusing on 3 large meals per day. Protein enriched foots. Eating when he has a desire. Denies nausea/vomiting. ll continue to closely monitor.   He has an appetite at times. Other times when he has a desire once he begins eating interest diminishes. Education provided on appetite stimulant.   We will continue to closely monitor.   4. Anxiety/Anxiousness  He has not been taking his  escitalopram. Reports he has become more anxious and does not like how it makes him feel. It was effective initially. Not really interested in dose adjustment. Could consider valium as additional options.    Jerek was started on escitalopram during office visit on 5/31. Dose was increased to $RemoveBefo'10mg'RhLNFMdDsmn$  on 6/14. Tolerating well. Encouraged to continue with medication as prescribed allowing ongoing monitoring and adjustments as needed. He verbalized understanding and appreciation.   Jovontae shares concerns with ongoing anxiety and anxiousness. States his mind sometimes wanders making him have increased anxiety thinking about the unknown and what ifs. Emotional support provided. He took one of his relative's Xanax and felt this helped him tremendously. Education provided on importance of not taking medications not prescribed as this could interfere with other medications or cause complications. We also discussed concerns with certain medications which could contribute to respiratory depression, lethargy in correlation with his pain medications.   He is emotional regarding his anxiety. Wife expresses concerns over this as well. Education provided on use of more long-term medication which can provide similar relief as Xanax. We will plan to start on Lexapro daily. Education provided on side effects in addition to importance on not stopping abruptly and taking as prescribed. He understands it may take some time for effectiveness and will not be an immediate response.   We will closely monitor.   5. Goals of care (5/16) Mr. Keziah shares his realistic understanding of his condition. He and his wife are making sure all "affairs" are in order as his biggest worry is leaving his wife in a financial constraint. Reports communicating with his life insurance policy etc. Emotional support provided.   He is emotional expressing racing thoughts during idol time or interruptions in his sleep as his mind wonders thinking about  worst case scenario. Is requesting something to assist with sleeping and anxiety. Acknowledged his request. Education provided on limited use in collaboration with other medications including zyprexa. He verbalized understanding and aware  we will continue to support and monitor for needs.    PLAN: methadone 10 mg every 8 hours. Close monitoring. Tolerating well. Pain improved.  Oxycodone 10 mg as needed for breakthrough pain.  Miralax daily for constipation Will consider valium for anxiety management.  Zofran as needed for nausea as prescribed.  Will switch to magace $RemoveB'20mg'MpOglEYl$  daily for appetite stimulant. Unable to afford marinol due to loss of insurance. Not effective.  I will plan to see patient back in 4-6 weeks in collaboration to other oncology appointments.    Patient expressed understanding and was in agreement with this plan. He also understands that He can call the clinic at any time with any questions, concerns, or complaints.   Thank you for your referral and allowing Palliative to assist in Mr. Iverson Sees [facility] care.        Any controlled substances utilized were prescribed in the context of palliative care. PDMP has been reviewed.    Time Total: 35 min   Visit consisted of counseling and education dealing with the complex and emotionally intense issues of symptom management and palliative care in the setting of serious and potentially life-threatening illness.Greater than 50%  of this time was spent counseling and coordinating care related to the above assessment and plan.  Alda Lea, AGPCNP-BC  Palliative Medicine Team/[place]

## 2022-07-06 NOTE — Progress Notes (Signed)
[place] [phone]   [phone]  PROGRESS NOTE  Patient Care Team: Delene Ruffini, MD as PCP - General (Internal Medicine) Pickenpack-Cousar, Carlena Sax, NP as Nurse Practitioner (Nurse Practitioner)  Hematological/Oncological History # Waldenstrom's Macroglobulinemia # IgM Monoclonal Gammopathy 04/07/2021: CT A/p showed mildly enlarged retroperitoneal lymph nodes and bilateral inguinal lymph nodes.  04/08/2021: IgM Kappa M protein 1.2 04/09/2021: WBC 11.1, Hgb 9.5, MCV 75.4, Plt 525 04/20/2021: Kappa 977, Lambda 7.8, K/L ratio 125.28. Serum viscosity 1.8 05/06/2021: Establish care with Dr. 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 Infusion reaction with ritux, held halfway through.  06/12/2021: Cycle 2 Day 1 of Ibrutinib + Rituximab 06/19/2021: Cycle 3 Day 1 of Ibrutinib + Rituximab 06/26/2021: Cycle 4 Day 1 of rituximab. Continued on daily ibrutinib 12/02/2021: Cycle 5 Day 1 of Ibrutinib + Rituximab 12/09/2021: Cycle 6 Day 1 of Ibrutinib + Rituximab 12/16/2021: Cycle 7 Day 1 of Ibrutinib + Rituximab 12/23/2021: Held Cycle 8 of Ritxumab due to nausea/vomiting, elevated creatinine, finger infection  Interval History:  Connor Solomon 61 y.o. male with medical history significant for Waldenstrom's macroglobulinemia who presents for a follow up visit. The patient's last visit was on 06/10/2022. In the interim since the last visit he has continued on Ibrutinib therapy.   On exam today Connor Solomon reports he has a chest cold going on for about 2 weeks time.  He reports he is coughing up yellow phlegm but that it is steadily improving.  He reports that he is having some runny nose but no sore throat.  He notes that he is eating well though his weight has decreased 2 pounds in the  interim since her last visit.  He has been having some occasional vision issues where his eyes hurt.  He reports sometimes vision, blurry.  If he covers up his eyes for a few moments the pain resolves.  He reports that he is does not currently have an ophthalmologist and is waiting till he has Medicaid and wanted to see 1.  His pain currently is a 3 out of 10.  He reports that his pain is well controlled with current pain regimen with methadone 10 mg 3 times a day.He denies fevers, chills, night sweats, shortness of breath, chest pain or cough. He has no other complaints. Full 10 point ROS is listed below.   MEDICAL HISTORY:  Past Medical History:  Diagnosis Date   Acute heart failure ([place]) 04/08/2021   Acute HFrEF (heart failure with reduced ejection fraction) ([place]) 04/07/2021   Arthritis    hands, elbows bilaterally   Cancer ([facility])    Full dentures    Hypertension    Pt states he doen not have HTN and has never been treated for HTN   Kidney stone on left side last stone 4-5 yrs ago   recurrent (3 episodes)   Panic disorder    pt states has resolved   Syncope    resolved- was related to panic attacks    SURGICAL HISTORY: Past Surgical History:  Procedure Laterality Date   CARPAL METACARPAL FUSION WITH DISTAL RADIAL BONE GRAFT Left 05/30/2013   Procedure: LEFT THUMB METACARPAL JOINT FUSION;  Surgeon: Schuyler Amor, MD;  Location: [facility];  Service: Orthopedics;  Laterality: Left;   ESOPHAGOGASTRODUODENOSCOPY N/A 02/12/2014   Procedure: ESOPHAGOGASTRODUODENOSCOPY (EGD);  Surgeon: Jerene Bears,  MD;  Location: [facility];  Service: Endoscopy;  Laterality: N/A;   FOREIGN BODY REMOVAL N/A 02/12/2014   Procedure: FOREIGN BODY REMOVAL;  Surgeon: Jerene Bears, MD;  Location: [facility];  Service: Endoscopy;  Laterality: N/A;   OPEN REDUCTION INTERNAL FIXATION (ORIF) DISTAL RADIAL FRACTURE Left 11/29/2012   Procedure: OPEN REDUCTION INTERNAL FIXATION (ORIF) DISTAL RADIAL  FRACTURE;  Surgeon: Schuyler Amor, MD;  Location: [facility];  Service: Orthopedics;  Laterality: Left;  LEFT DISTAL RADIUS OSTEOTOMY WITH BONE GRAFT   RIGHT/LEFT HEART CATH AND CORONARY ANGIOGRAPHY N/A 04/09/2021   Procedure: RIGHT/LEFT HEART CATH AND CORONARY ANGIOGRAPHY;  Surgeon: Jolaine Artist, MD;  Location: [facility];  Service: Cardiovascular;  Laterality: N/A;   TENDON REPAIR Left 02/07/2013   Procedure: LEFT EXTENSOR INDICIS PROPRIUS  TO EXTENSOR POLLICIS LONGUS TENDON TRANSFER;  Surgeon: Schuyler Amor, MD;  Location: [facility];  Service: Orthopedics;  Laterality: Left;   WISDOM TOOTH EXTRACTION     WRIST SURGERY     fx only    SOCIAL HISTORY: Social History   Socioeconomic History   Marital status: Single    Spouse name: Not on file   Number of children: Not on file   Years of education: Not on file   Highest education level: Not on file  Occupational History   Not on file  Tobacco Use   Smoking status: Former    Types: Cigars    Quit date: 10/11/2020    Years since quitting: 1.7   Smokeless tobacco: Never   Tobacco comments:    smokes 1 cigar a week  Vaping Use   Vaping Use: Never used  Substance and Sexual Activity   Alcohol use: No   Drug use: No   Sexual activity: Not on file  Other Topics Concern   Not on file  Social History Narrative   Not on file   Social Determinants of Health   Financial Resource Strain: High Risk (07/17/2021)   Overall Financial Resource Strain (CARDIA)    Difficulty of Paying Living Expenses: Very hard  Food Insecurity: Food Insecurity Present (04/10/2021)   Hunger Vital Sign    Worried About [place] in the Last Year: Sometimes true    Ran Out of Food in the Last Year: Sometimes true  Transportation Needs: No Transportation Needs (04/10/2021)   PRAPARE - Hydrologist (Medical): No    Lack of Transportation (Non-Medical): No  Physical  Activity: Not on file  Stress: Stress Concern Present (09/01/2021)   [place]    Feeling of Stress : Very much  Social Connections: Not on file  Intimate Partner Violence: Not on file    FAMILY HISTORY: Family History  Problem Relation Age of Onset   Heart attack Father    Sudden Cardiac Death Father 63   Diabetes Neg Hx    Hyperlipidemia Neg Hx    Hypertension Neg Hx     ALLERGIES:  is allergic to rituxan [rituximab] and hydrocodone.  MEDICATIONS:  Current Outpatient Medications  Medication Sig Dispense Refill   acetaminophen (TYLENOL) 500 MG tablet Take 500 mg by mouth every 6 (six) hours as needed.     albuterol (PROVENTIL HFA) 108 (90 Base) MCG/ACT inhaler Inhale 2 puffs into the lungs every 6 hours as needed for wheezing or shortness of breath 6.7 g 2   carvedilol (COREG) 3.125 MG tablet Take  1 tablet by mouth 2 times daily. 60 tablet 6   dapagliflozin propanediol (FARXIGA) 10 MG TABS tablet Take 1 tablet (10 mg total) by mouth daily before breakfast. 30 tablet 11   dronabinol (MARINOL) 2.5 MG capsule Take 1 capsule by mouth 2 times daily before a meal. 60 capsule 0   eplerenone (INSPRA) 25 MG tablet Take 1/2 tablet (12.5 mg total) by mouth daily. 15 tablet 3   escitalopram (LEXAPRO) 5 MG tablet Take 1 tablet (5 mg total) by mouth daily. 30 tablet 0   ferrous sulfate 325 (65 FE) MG tablet Take 1 tablet by mouth daily with breakfast. Please take with a source of Vitamin C 90 tablet 3   furosemide (LASIX) 20 MG tablet Take 1 tablet (20 mg total) by mouth daily as needed for swelling 100 tablet 2   ibrutinib (IMBRUVICA) 420 MG tablet Take 1 tablet (420 mg total) by mouth daily. 30 tablet 2   megestrol (MEGACE) 20 MG tablet Take 1 tablet (20 mg total) by mouth daily. 30 tablet 1   methadone (DOLOPHINE) 10 MG tablet Take 1 tablet by mouth every 8 hours. 90 tablet 0   metoCLOPramide (REGLAN) 10 MG tablet Take 1  tablet (10 mg total) by mouth every 8 (eight) hours as needed for nausea. 30 tablet 1   naloxone (NARCAN) nasal spray 4 mg/0.1 mL Take for overdose 1 each 0   naloxone (NARCAN) nasal spray 4 mg/0.1 mL Take for overdose as directed 2 each 0   ondansetron (ZOFRAN) 8 MG tablet Take 1 tablet by mouth every 8 hours as needed. 30 tablet 0   Oxycodone HCl 10 MG TABS Take 1 tablet (10 mg total) by mouth every 4 (four) hours as needed. 90 tablet 0   potassium chloride SA (KLOR-CON M) 20 MEQ tablet Take 1 tablet (20 mEq total) by mouth daily. 30 tablet 1   prochlorperazine (COMPAZINE) 10 MG tablet Take 1 tablet by mouth every 6 hours as needed for nausea or vomiting. 60 tablet 1   sacubitril-valsartan (ENTRESTO) 24-26 MG Take 1 tablet by mouth 2 (two) times daily. 180 tablet 3   No current facility-administered medications for this visit.    REVIEW OF SYSTEMS:   Constitutional: ( - ) fevers, ( - )  chills , ( - ) night sweats Eyes: ( - ) blurriness of vision, ( - ) double vision, ( - ) watery eyes Ears, nose, mouth, throat, and face: ( - ) mucositis, ( - ) sore throat Respiratory: ( - ) cough, ( - ) dyspnea, ( - ) wheezes Cardiovascular: ( - ) palpitation, ( - ) chest discomfort, ( - ) lower extremity swelling Gastrointestinal:  ( + ) nausea, ( - ) heartburn, ( - ) change in bowel habits Skin: ( - ) abnormal skin rashes Lymphatics: ( - ) new lymphadenopathy, ( - ) easy bruising Neurological: ( - ) numbness, ( - ) tingling, ( - ) new weaknesses Behavioral/Psych: ( - ) mood change, ( - ) new changes  All other systems were reviewed with the patient and are negative.  PHYSICAL EXAMINATION: ECOG PERFORMANCE STATUS: 1 - Symptomatic but completely ambulatory  There were no vitals filed for this visit.    There were no vitals filed for this visit.   GENERAL: Chronically ill appearing middle-aged Caucasian male, alert, no distress and comfortable SKIN: skin color, texture, turgor are normal, no  rashes or significant lesions.  EYES: conjunctiva are pink and non-injected, sclera clear  LUNGS: clear to auscultation and percussion with normal breathing effort HEART: regular rate & rhythm and no murmurs and no lower extremity edema  PSYCH: alert & oriented x 3, fluent speech NEURO: no focal motor/sensory deficits   LABORATORY DATA:  I have reviewed the data as listed    Latest Ref Rng & Units 07/06/2022    9:59 AM 06/10/2022   10:38 AM 05/04/2022    1:01 PM  CBC  WBC 4.0 - 10.5 K/uL 5.4  5.2  5.5   Hemoglobin 13.0 - 17.0 g/dL 10.4  14.1  13.2   Hematocrit 39.0 - 52.0 % 30.5  41.1  40.1   Platelets 150 - 400 K/uL 269  228  202        Latest Ref Rng & Units 07/06/2022    9:59 AM 06/10/2022   10:38 AM 05/04/2022    1:01 PM  CMP  Glucose 70 - 99 mg/dL 93  140  90   BUN 8 - 23 mg/dL _0 Creatinine 0.61 - 1.24 mg/dL 0.82  0.75  0.87   Sodium 135 - 145 mmol/L 136  136  136   Potassium 3.5 - 5.1 mmol/L 2.9  3.4  3.7   Chloride 98 - 111 mmol/L 105  108  105   CO2 22 - 32 mmol/L _1 Calcium 8.9 - 10.3 mg/dL 8.3  9.2  8.5   Total Protein 6.5 - 8.1 g/dL 5.4  6.0  5.4   Total Bilirubin 0.3 - 1.2 mg/dL 0.6  0.5  0.5   Alkaline Phos 38 - 126 U/L 70  55  67   AST 15 - 41 U/L _2 ALT 0 - 44 U/L _3 Lab Results  Component Value Date   MPROTEIN 0.1 (H) 06/10/2022   MPROTEIN 0.1 (H) 05/04/2022   MPROTEIN 0.1 (H) 04/06/2022   Lab Results  Component Value Date   KPAFRELGTCHN 21.4 (H) 06/10/2022   KPAFRELGTCHN 26.3 (H) 05/04/2022   KPAFRELGTCHN 30.8 (H) 04/06/2022   LAMBDASER 4.1 (L) 06/10/2022   LAMBDASER 1.6 (L) 05/04/2022   LAMBDASER 4.2 (L) 04/06/2022   KAPLAMBRATIO 5.22 (H) 06/10/2022   KAPLAMBRATIO 16.44 (H) 05/04/2022   KAPLAMBRATIO 7.33 (H) 04/06/2022    RADIOGRAPHIC STUDIES: No results found.  ASSESSMENT & PLAN Connor Solomon 61 y.o. male with medical history significant for Waldenstrom's macroglobulinemia who presents for a follow  up visit.   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  There is no clear evidence of hyperviscosity syndrome at this time.  At this time we will plan to proceed with ibrutinib and rituximab therapy.  One could also consider Bendamustine and rituximab therapy but given the patient's degree of anemia I would prefer pursuing ibrutinib and rituximab at this time.  Additionally ibrutinib/rituximab are category 1 recommendations per the NCCN guidelines.  The treatment will consist of ibrutinib 420 mg p.o. daily with rituximab on weeks 1 through 4 as well as weeks 27 through 30.  Previously we discussed the risks and benefits of therapy including (but not limited to) risk of bleeding, fatigue, atypical infections, and cardiac arrhythmias.  The patient voices understanding of this plan moving forward.   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 --Started second round of weekly rituximab on 12/02/2021. Continued weekly x 4 weeks.   --last rituximab dose held due to missed visits/illness.  Plan: --Labs today were reviewed without any intervention needed.  Labs show white cell count 5.4, hemoglobin 10.4, MCV 81.3, and platelets of 269.  Pending SPEP and sFLC levels but prior levels appear stable. --continues on ibrutinib 420 mg PO daily.  --RTC in 4 weeks with labs   # Hypokalemia -- Potassium 2.9, will provide K-Dur 20 meq daily to help replete his levels.  #Pain Management --Pain was poorly controlled on hydrocodone therapy previously.  --Patient was previously  admitted for for opioid overdose with presumed fentanyl --Currently pain regimen includes methadone 10 mg TID per palliative care.  -- Appreciate recommendations of palliative care.  #Normocytic Anemia  --Hgb 10.4 , Plt 269 --previously there was a component of iron deficiency anemia as well.  Currently on PO ferrous sulfate to try and bolster his iron levels.  --Continue to monitor.  #Nausea and vomiting--improving -- Concern that the nausea and vomiting may not be related to the ibrutinib therapy and potentially tied to poor gastric motility due to opioid prescription.  --Unable to tolerate olanzopine so discontinued --Not taking metoclopramide 10 mg every 8 hours  --continue zofran PRN.   #Supportive Care -- chemotherapy education complete -- port placement not required   Orders Placed This Encounter  Procedures   CBC with Differential ([facility] Only)    Standing Status:   Future    Standing Expiration Date:   07/07/2023   CMP ([facility] only)    Standing Status:   Future    Standing Expiration Date:   07/07/2023   Ferritin    Standing Status:   Future    Standing Expiration Date:   07/07/2023   Iron and Iron Binding Capacity ([facility] only)    Standing Status:   Future    Standing Expiration Date:   07/07/2023   Retic Panel    Standing Status:   Future    Standing Expiration Date:   07/07/2023   All questions were answered. The patient knows to call the clinic with any problems, questions or concerns.  I have spent a total of 30 minutes minutes of face-to-face and non-face-to-face time, preparing to see the patient,  performing a medically appropriate examination, counseling and educating the patient, ordering medications/tests, referring and communicating with other health care professionals, documenting clinical information in the electronic health record, and care coordination.   Ledell Peoples, MD Department of Hematology/Oncology [place] at  [facility] Phone: [phone] Pager: [phone] Email: [email or web address]  07/06/2022 4:14 PM  Dimopoulos MA, Rennie Natter, Trotman J, Basilia Jumbo, Leblond V, Mahe B, Herbaux C, Tam C, Orsucci L, [place] ML, Matous JV, [place] C, Kastritis E, Sigurd, Li J, Salman Z, Graef T, Buske C; iNNOVATE Study Group and the [employer] for [employer]. Phase 3 Trial of Ibrutinib plus Rituximab in Waldenstrm's Macroglobulinemia. Alison Stalling J Med. 2018 Jun 21;378(25):2399-2410.   --At 30 months, the progression-free survival rate was 82% with ibrutinib-rituximab versus 28% with placebo-rituximab (hazard ratio for progression or death, 0.20; P<0.001).

## 2022-07-07 ENCOUNTER — Other Ambulatory Visit (HOSPITAL_COMMUNITY): Payer: Self-pay

## 2022-07-07 LAB — KAPPA/LAMBDA LIGHT CHAINS
Kappa free light chain: 26.2 mg/L — ABNORMAL HIGH (ref 3.3–19.4)
Kappa, lambda light chain ratio: 6.09 — ABNORMAL HIGH (ref 0.26–1.65)
Lambda free light chains: 4.3 mg/L — ABNORMAL LOW (ref 5.7–26.3)

## 2022-07-09 ENCOUNTER — Other Ambulatory Visit: Payer: Self-pay

## 2022-07-09 ENCOUNTER — Other Ambulatory Visit (HOSPITAL_COMMUNITY): Payer: Self-pay

## 2022-07-09 DIAGNOSIS — C8800 Waldenstrom macroglobulinemia not having achieved remission: Secondary | ICD-10-CM

## 2022-07-09 DIAGNOSIS — Z515 Encounter for palliative care: Secondary | ICD-10-CM

## 2022-07-09 DIAGNOSIS — G893 Neoplasm related pain (acute) (chronic): Secondary | ICD-10-CM

## 2022-07-09 LAB — MULTIPLE MYELOMA PANEL, SERUM
Albumin SerPl Elph-Mcnc: 2.3 g/dL — ABNORMAL LOW (ref 2.9–4.4)
Albumin/Glob SerPl: 1.1 (ref 0.7–1.7)
Alpha 1: 0.4 g/dL (ref 0.0–0.4)
Alpha2 Glob SerPl Elph-Mcnc: 0.9 g/dL (ref 0.4–1.0)
B-Globulin SerPl Elph-Mcnc: 0.7 g/dL (ref 0.7–1.3)
Gamma Glob SerPl Elph-Mcnc: 0.2 g/dL — ABNORMAL LOW (ref 0.4–1.8)
Globulin, Total: 2.2 g/dL (ref 2.2–3.9)
IgA: 9 mg/dL — ABNORMAL LOW (ref 61–437)
IgG (Immunoglobin G), Serum: 95 mg/dL — ABNORMAL LOW (ref 603–1613)
IgM (Immunoglobulin M), Srm: 79 mg/dL (ref 20–172)
M Protein SerPl Elph-Mcnc: 0.1 g/dL — ABNORMAL HIGH
Total Protein ELP: 4.5 g/dL — ABNORMAL LOW (ref 6.0–8.5)

## 2022-07-09 MED ORDER — METHADONE HCL 10 MG PO TABS
10.0000 mg | ORAL_TABLET | Freq: Three times a day (TID) | ORAL | 0 refills | Status: DC
  Filled 2022-07-09: qty 90, 30d supply, fill #0

## 2022-07-12 ENCOUNTER — Other Ambulatory Visit (HOSPITAL_COMMUNITY): Payer: Self-pay

## 2022-07-13 ENCOUNTER — Other Ambulatory Visit: Payer: Self-pay

## 2022-07-13 ENCOUNTER — Inpatient Hospital Stay: Payer: Self-pay | Attending: Hematology and Oncology

## 2022-07-13 DIAGNOSIS — C880 Waldenstrom macroglobulinemia: Secondary | ICD-10-CM | POA: Insufficient documentation

## 2022-07-13 DIAGNOSIS — Z5941 Food insecurity: Secondary | ICD-10-CM | POA: Insufficient documentation

## 2022-07-13 DIAGNOSIS — Z5986 Financial insecurity: Secondary | ICD-10-CM | POA: Insufficient documentation

## 2022-07-13 DIAGNOSIS — Z885 Allergy status to narcotic agent status: Secondary | ICD-10-CM | POA: Insufficient documentation

## 2022-07-13 DIAGNOSIS — M25559 Pain in unspecified hip: Secondary | ICD-10-CM | POA: Insufficient documentation

## 2022-07-13 DIAGNOSIS — Z87891 Personal history of nicotine dependence: Secondary | ICD-10-CM | POA: Insufficient documentation

## 2022-07-13 DIAGNOSIS — I5022 Chronic systolic (congestive) heart failure: Secondary | ICD-10-CM | POA: Insufficient documentation

## 2022-07-13 DIAGNOSIS — R21 Rash and other nonspecific skin eruption: Secondary | ICD-10-CM | POA: Insufficient documentation

## 2022-07-13 DIAGNOSIS — R509 Fever, unspecified: Secondary | ICD-10-CM | POA: Insufficient documentation

## 2022-07-13 DIAGNOSIS — Z8249 Family history of ischemic heart disease and other diseases of the circulatory system: Secondary | ICD-10-CM | POA: Insufficient documentation

## 2022-07-13 DIAGNOSIS — Z79899 Other long term (current) drug therapy: Secondary | ICD-10-CM | POA: Insufficient documentation

## 2022-07-13 DIAGNOSIS — D472 Monoclonal gammopathy: Secondary | ICD-10-CM | POA: Insufficient documentation

## 2022-07-13 DIAGNOSIS — K5900 Constipation, unspecified: Secondary | ICD-10-CM | POA: Insufficient documentation

## 2022-07-13 LAB — RETIC PANEL
Immature Retic Fract: 15.7 % (ref 2.3–15.9)
RBC.: 3.92 MIL/uL — ABNORMAL LOW (ref 4.22–5.81)
Retic Count, Absolute: 70.6 10*3/uL (ref 19.0–186.0)
Retic Ct Pct: 1.8 % (ref 0.4–3.1)
Reticulocyte Hemoglobin: 31.1 pg (ref >27.9)

## 2022-07-13 LAB — IRON AND IRON BINDING CAPACITY (CC-WL,HP ONLY)
Iron: 32 ug/dL — ABNORMAL LOW (ref 45–182)
Saturation Ratios: 14 % — ABNORMAL LOW (ref 17.9–39.5)
TIBC: 232 ug/dL — ABNORMAL LOW (ref 250–450)
UIBC: 200 ug/dL (ref 117–376)

## 2022-07-13 LAB — FERRITIN: Ferritin: 248 ng/mL (ref 24–336)

## 2022-07-14 ENCOUNTER — Other Ambulatory Visit: Payer: Self-pay | Admitting: Nurse Practitioner

## 2022-07-14 ENCOUNTER — Other Ambulatory Visit (HOSPITAL_COMMUNITY): Payer: Self-pay

## 2022-07-14 DIAGNOSIS — C880 Waldenstrom macroglobulinemia: Secondary | ICD-10-CM

## 2022-07-14 DIAGNOSIS — G893 Neoplasm related pain (acute) (chronic): Secondary | ICD-10-CM

## 2022-07-14 DIAGNOSIS — Z515 Encounter for palliative care: Secondary | ICD-10-CM

## 2022-07-14 MED ORDER — OXYCODONE HCL 10 MG PO TABS
10.0000 mg | ORAL_TABLET | ORAL | 0 refills | Status: DC | PRN
  Filled 2022-07-16: qty 90, 15d supply, fill #0

## 2022-07-16 ENCOUNTER — Other Ambulatory Visit (HOSPITAL_COMMUNITY): Payer: Self-pay

## 2022-07-22 ENCOUNTER — Telehealth: Payer: Self-pay | Admitting: *Deleted

## 2022-07-22 NOTE — Telephone Encounter (Signed)
Received call from pt's wife, Connor Solomon. She states he needs to be seen soon. He has developed redness and blisters/sores on both arms. She states it is very painful for him. Advised that Dr. Libby Maw schedule is full but that we have a Symptom Management Clinic here at the [facility] for situations like this. She is agreeable to having Connor Solomon seen in this clinic. Advised that we will get him scheduled for tomorrow morning and they would be notified of time.

## 2022-07-22 NOTE — Progress Notes (Signed)
Symptom Management Consult note [facility]    Patient Care Team: Delene Ruffini, MD as PCP - General (Internal Medicine) Pickenpack-Cousar, Carlena Sax, NP as Nurse Practitioner (Nurse Practitioner)    Name of the patient: Connor Solomon  [id number]  1961-02-15   Date of visit: 07/23/2022   Chief Complaint/Reason for visit: Rash   Current Therapy: Ibrutinib     ASSESSMENT & PLAN: Patient is a 61 y.o. male  with oncologic history of Waldenstrom's macroglobulinemia  followed by Dr. Lorenso Courier.  I have viewed most recent oncology note and lab work.    #) Waldenstrom's macroglobulinemia  - Currently taking Ibrutinib  - Next appointment with oncologist and palliative care is 08/05/22   #) Rash -Rash is suggestive of adverse effect from Ibrutinib. Exam without signs of secondary bacterial infection. Discussed with Dr. Lorenso Courier who agrees with plan to hold Ibrutinib until next appointment. -Prescription for Medrol Dosepak sent to pharmacy.  #) Fever -Afebrile today although took tylenol this AM, Tmax 101 x 3 days ago. No hypoxia. -Fever work up initiated -CBC without leukocytosis, ANC WNL. CMP overall unremarkable. UA without infection. Blood cultures collected. -Chest xray viewed by me is concerning for pneumonia with left basilar opacity. Agree with radiologist impression. -Antibiotics Augmentin and z-pak called in to pharmacy.  - Strict ED precautions discussed should symptoms worsen.     Heme/Onc History: Oncology History  Waldenstrom macroglobulinemia ([place])  05/24/2021 Initial Diagnosis   Waldenstrom macroglobulinemia ([place])   06/05/2021 - 12/30/2021 Chemotherapy   Patient is on Treatment Plan : NON-HODGKINS LYMPHOMA Rituximab q7d         Interval history-: JEREMYAH JELLEY is a 61 y.o. male with oncologic history as above presenting to [facility] today with chief complaint of rash and fever. Patient reports rash on bilateral arms x 2 weeks. It was progressively  worsening although he thinks might look better today. He reports burning pain, rates it 3/10 in severity. His arms were swollen last week and there was clear fluid weeping he reports. He mentions his fingernails have been falling off over the past 2 months as well.  He reports recent sick contact, granddaughter had what they think was a viral illness. He's been fighting a cold the last 10 days he tells me with nasal congestion, fever, and cough. T-max of 101 x 3 days ago. He has been taking tylenol, took some this morning PTA. He denies any chills, shortness of breath, chest pain, abdominal pain, urinary symptoms, diarrhea.   ROS  All other systems are reviewed and are negative for acute change except as noted in the HPI.    Allergies  Allergen Reactions   Rituxan [Rituximab] Other (See Comments) and Cough    Mild chest discomfort & rigors   Hydrocodone Itching and Nausea And Vomiting     Past Medical History:  Diagnosis Date   Acute heart failure ([place]) 04/08/2021   Acute HFrEF (heart failure with reduced ejection fraction) ([place]) 04/07/2021   Arthritis    hands, elbows bilaterally   Cancer ([place])    Full dentures    Hypertension    Pt states he doen not have HTN and has never been treated for HTN   Kidney stone on left side last stone 4-5 yrs ago   recurrent (3 episodes)   Panic disorder    pt states has resolved   Syncope    resolved- was related to panic attacks     Past Surgical History:  Procedure  Laterality Date   CARPAL METACARPAL FUSION WITH DISTAL RADIAL BONE GRAFT Left 05/30/2013   Procedure: LEFT THUMB METACARPAL JOINT FUSION;  Surgeon: Schuyler Amor, MD;  Location: [place];  Service: Orthopedics;  Laterality: Left;   ESOPHAGOGASTRODUODENOSCOPY N/A 02/12/2014   Procedure: ESOPHAGOGASTRODUODENOSCOPY (EGD);  Surgeon: Jerene Bears, MD;  Location: [facility] ENDOSCOPY;  Service: Endoscopy;  Laterality: N/A;   FOREIGN BODY REMOVAL N/A 02/12/2014   Procedure:  FOREIGN BODY REMOVAL;  Surgeon: Jerene Bears, MD;  Location: [place];  Service: Endoscopy;  Laterality: N/A;   OPEN REDUCTION INTERNAL FIXATION (ORIF) DISTAL RADIAL FRACTURE Left 11/29/2012   Procedure: OPEN REDUCTION INTERNAL FIXATION (ORIF) DISTAL RADIAL FRACTURE;  Surgeon: Schuyler Amor, MD;  Location: [place];  Service: Orthopedics;  Laterality: Left;  LEFT DISTAL RADIUS OSTEOTOMY WITH BONE GRAFT   RIGHT/LEFT HEART CATH AND CORONARY ANGIOGRAPHY N/A 04/09/2021   Procedure: RIGHT/LEFT HEART CATH AND CORONARY ANGIOGRAPHY;  Surgeon: Jolaine Artist, MD;  Location: [place] [facility];  Service: Cardiovascular;  Laterality: N/A;   TENDON REPAIR Left 02/07/2013   Procedure: LEFT EXTENSOR INDICIS PROPRIUS  TO EXTENSOR POLLICIS LONGUS TENDON TRANSFER;  Surgeon: Schuyler Amor, MD;  Location: [place];  Service: Orthopedics;  Laterality: Left;   WISDOM TOOTH EXTRACTION     WRIST SURGERY     fx only    Social History   Socioeconomic History   Marital status: Single    Spouse name: Not on file   Number of children: Not on file   Years of education: Not on file   Highest education level: Not on file  Occupational History   Not on file  Tobacco Use   Smoking status: Former    Types: Cigars    Quit date: 10/11/2020    Years since quitting: 1.7   Smokeless tobacco: Never   Tobacco comments:    smokes 1 cigar a week  Vaping Use   Vaping Use: Never used  Substance and Sexual Activity   Alcohol use: No   Drug use: No   Sexual activity: Not on file  Other Topics Concern   Not on file  Social History Narrative   Not on file   Social Determinants of Health   Financial Resource Strain: High Risk (07/17/2021)   Overall Financial Resource Strain (CARDIA)    Difficulty of Paying Living Expenses: Very hard  Food Insecurity: Food Insecurity Present (04/10/2021)   Hunger Vital Sign    Worried About [place] in the Last Year: Sometimes  true    Ran Out of Food in the Last Year: Sometimes true  Transportation Needs: No Transportation Needs (04/10/2021)   PRAPARE - Hydrologist (Medical): No    Lack of Transportation (Non-Medical): No  Physical Activity: Not on file  Stress: Stress Concern Present (09/01/2021)   [place]    Feeling of Stress : Very much  Social Connections: Not on file  Intimate Partner Violence: Not on file    Family History  Problem Relation Age of Onset   Heart attack Father    Sudden Cardiac Death Father 59   Diabetes Neg Hx    Hyperlipidemia Neg Hx    Hypertension Neg Hx      Current Outpatient Medications:    amoxicillin-clavulanate (AUGMENTIN) 875-125 MG tablet, Take 1 tablet by mouth 2 (two) times daily for 7 days., Disp: 14 tablet,  Rfl: 0   azithromycin (ZITHROMAX Z-PAK) 250 MG tablet, Take 2 tablets (500 mg total) by mouth daily for 1 day, THEN 1 tablet (250 mg total) daily for 4 days., Disp: 6 each, Rfl: 0   methylPREDNISolone (MEDROL DOSEPAK) 4 MG TBPK tablet, Take 6 tablets by mouth daily for 1 day, THEN decrease by 1 tablet daily until finished (6-5-4-3-2-1), Disp: 21 tablet, Rfl: 0   acetaminophen (TYLENOL) 500 MG tablet, Take 500 mg by mouth every 6 (six) hours as needed., Disp: , Rfl:    albuterol (PROVENTIL HFA) 108 (90 Base) MCG/ACT inhaler, Inhale 2 puffs into the lungs every 6 hours as needed for wheezing or shortness of breath, Disp: 6.7 g, Rfl: 2   carvedilol (COREG) 3.125 MG tablet, Take 1 tablet by mouth 2 times daily., Disp: 60 tablet, Rfl: 6   dapagliflozin propanediol (FARXIGA) 10 MG TABS tablet, Take 1 tablet (10 mg total) by mouth daily before breakfast., Disp: 30 tablet, Rfl: 11   dronabinol (MARINOL) 2.5 MG capsule, Take 1 capsule by mouth 2 times daily before a meal., Disp: 60 capsule, Rfl: 0   eplerenone (INSPRA) 25 MG tablet, Take 1/2 tablet (12.5 mg total) by mouth daily.,  Disp: 15 tablet, Rfl: 3   escitalopram (LEXAPRO) 5 MG tablet, Take 1 tablet (5 mg total) by mouth daily., Disp: 30 tablet, Rfl: 0   ferrous sulfate 325 (65 FE) MG tablet, Take 1 tablet by mouth daily with breakfast. Please take with a source of Vitamin C, Disp: 90 tablet, Rfl: 3   furosemide (LASIX) 20 MG tablet, Take 1 tablet (20 mg total) by mouth daily as needed for swelling, Disp: 100 tablet, Rfl: 2   ibrutinib (IMBRUVICA) 420 MG tablet, Take 1 tablet (420 mg total) by mouth daily., Disp: 30 tablet, Rfl: 2   megestrol (MEGACE) 20 MG tablet, Take 1 tablet (20 mg total) by mouth daily., Disp: 30 tablet, Rfl: 1   methadone (DOLOPHINE) 10 MG tablet, Take 1 tablet by mouth every 8 hours., Disp: 90 tablet, Rfl: 0   metoCLOPramide (REGLAN) 10 MG tablet, Take 1 tablet (10 mg total) by mouth every 8 (eight) hours as needed for nausea., Disp: 30 tablet, Rfl: 1   naloxone (NARCAN) nasal spray 4 mg/0.1 mL, Take for overdose, Disp: 1 each, Rfl: 0   naloxone (NARCAN) nasal spray 4 mg/0.1 mL, Take for overdose as directed, Disp: 2 each, Rfl: 0   ondansetron (ZOFRAN) 8 MG tablet, Take 1 tablet by mouth every 8 hours as needed., Disp: 30 tablet, Rfl: 0   Oxycodone HCl 10 MG TABS, Take 1 tablet (10 mg total) by mouth every 4 (four) hours as needed., Disp: 90 tablet, Rfl: 0   potassium chloride SA (KLOR-CON M) 20 MEQ tablet, Take 1 tablet (20 mEq total) by mouth daily., Disp: 30 tablet, Rfl: 1   prochlorperazine (COMPAZINE) 10 MG tablet, Take 1 tablet by mouth every 6 hours as needed for nausea or vomiting., Disp: 60 tablet, Rfl: 1   sacubitril-valsartan (ENTRESTO) 24-26 MG, Take 1 tablet by mouth 2 (two) times daily., Disp: 180 tablet, Rfl: 3  PHYSICAL EXAM: ECOG FS:1 - Symptomatic but completely ambulatory    Vitals:   07/23/22 1019  BP: 134/81  Pulse: 72  Resp: 14  Temp: 97.7 F (36.5 C)  TempSrc: Oral  SpO2: 99%  Weight: 125 lb 4.8 oz (56.8 kg)   Physical Exam Vitals and nursing note reviewed.   Constitutional:      Appearance: He is  well-developed. He is not ill-appearing or toxic-appearing.  HENT:     Head: Normocephalic.     Nose: Nose normal.  Eyes:     Conjunctiva/sclera: Conjunctivae normal.  Neck:     Vascular: No JVD.  Cardiovascular:     Rate and Rhythm: Normal rate and regular rhythm.     Pulses: Normal pulses.          Radial pulses are 2+ on the right side and 2+ on the left side.     Heart sounds: Normal heart sounds.  Pulmonary:     Effort: Pulmonary effort is normal. No respiratory distress.     Breath sounds: No stridor.     Comments: Faint rales in left lower lobe Abdominal:     General: There is no distension.  Musculoskeletal:     Cervical back: Normal range of motion.     Right lower leg: No edema.     Left lower leg: No edema.     Comments: Compartments soft in bilateral UE. Full ROM of bilateral UE  Skin:    General: Skin is warm and dry.     Findings: Rash present.     Comments: Rash to bilateral forearms. Please see media below  Neurological:     Mental Status: He is oriented to person, place, and time.        LABORATORY DATA: I have reviewed the data as listed    Latest Ref Rng & Units 07/23/2022   11:23 AM 07/06/2022    9:59 AM 06/10/2022   10:38 AM  CBC  WBC 4.0 - 10.5 K/uL 8.1  5.4  5.2   Hemoglobin 13.0 - 17.0 g/dL 11.3  10.4  14.1   Hematocrit 39.0 - 52.0 % 33.6  30.5  41.1   Platelets 150 - 400 K/uL 415  269  228         Latest Ref Rng & Units 07/23/2022   11:23 AM 07/06/2022    9:59 AM 06/10/2022   10:38 AM  CMP  Glucose 70 - 99 mg/dL 83  93  140   BUN 8 - 23 mg/dL $Remove'11  14  11   'PsBQzPs$ Creatinine 0.61 - 1.24 mg/dL 0.73  0.82  0.75   Sodium 135 - 145 mmol/L 134  136  136   Potassium 3.5 - 5.1 mmol/L 4.0  2.9  3.4   Chloride 98 - 111 mmol/L 105  105  108   CO2 22 - 32 mmol/L $RemoveB'24  24  22   'xgWqRXcg$ Calcium 8.9 - 10.3 mg/dL 8.8  8.3  9.2   Total Protein 6.5 - 8.1 g/dL 6.1  5.4  6.0   Total Bilirubin 0.3 - 1.2 mg/dL 0.4  0.6  0.5    Alkaline Phos 38 - 126 U/L 84  70  55   AST 15 - 41 U/L $Remo'12  12  13   'SlTPP$ ALT 0 - 44 U/L $Remo'9  8  10        'HuFcb$ RADIOGRAPHIC STUDIES (from last 24 hours if applicable) I have personally reviewed the radiological images as listed and agreed with the findings in the report. DG Chest 2 View  Result Date: 07/23/2022 CLINICAL DATA:  Fever EXAM: CHEST - 2 VIEW COMPARISON:  CXR 01/28/22 FINDINGS: Slight interval improvement in previously seen bilateral patchy airspace opacities. Compared to prior exam there is a new left basilar opacity. The heart size and mediastinal contours are within normal limits. Both lungs are clear. The visualized skeletal structures  are unremarkable. IMPRESSION: Compared to prior exam there is a new left basilar opacity which could be due to atelectasis or pneumonia. Electronically Signed   By: Marin Roberts M.D.   On: 07/23/2022 14:17        Visit Diagnosis: 1. Fever, unspecified fever cause   2. Waldenstrom macroglobulinemia ([place])   3. Community acquired pneumonia, unspecified laterality      Orders Placed This Encounter  Procedures   Culture, Urine    Standing Status:   Future    Number of Occurrences:   1    Standing Expiration Date:   07/23/2023   Culture, blood (Routine X 2) w Reflex to ID Panel    Standing Status:   Standing    Number of Occurrences:   2    Order Specific Question:   Patient immune status    Answer:   Immunocompromised   DG Chest 2 View    Standing Status:   Future    Number of Occurrences:   1    Standing Expiration Date:   07/23/2023    Order Specific Question:   Reason for Exam (SYMPTOM  OR DIAGNOSIS REQUIRED)    Answer:   fever    Order Specific Question:   Preferred imaging location?    Answer:   [facility]   CBC with Differential ([place] Only)    Standing Status:   Future    Standing Expiration Date:   07/24/2023   CMP ([place] only)    Standing Status:   Future    Standing Expiration Date:   07/24/2023    Urinalysis, Complete w Microscopic    Standing Status:   Future    Number of Occurrences:   1    Standing Expiration Date:   07/24/2023    All questions were answered. The patient knows to call the clinic with any problems, questions or concerns. No barriers to learning was detected.  I have spent a total of 30 minutes minutes of face-to-face and non-face-to-face time, preparing to see the patient, obtaining and/or reviewing separately obtained history, performing a medically appropriate examination, counseling and educating the patient, ordering tests, documenting clinical information in the electronic health record, and care coordination (communications with other health care professionals or caregivers).    Thank you for allowing me to participate in the care of this patient.    Barrie Folk, PA-C Department of Hematology/Oncology [facility] at [facility] Phone: [phone]  [phone]    07/23/2022 2:42 PM

## 2022-07-23 ENCOUNTER — Ambulatory Visit (HOSPITAL_COMMUNITY)
Admission: RE | Admit: 2022-07-23 | Discharge: 2022-07-23 | Disposition: A | Discharge status: Home / Self Care | Payer: Self-pay | Source: Ambulatory Visit | Attending: Physician Assistant | Admitting: Physician Assistant

## 2022-07-23 ENCOUNTER — Other Ambulatory Visit: Payer: Self-pay

## 2022-07-23 ENCOUNTER — Inpatient Hospital Stay (HOSPITAL_BASED_OUTPATIENT_CLINIC_OR_DEPARTMENT_OTHER): Payer: Self-pay | Admitting: Physician Assistant

## 2022-07-23 ENCOUNTER — Other Ambulatory Visit (HOSPITAL_COMMUNITY): Payer: Self-pay

## 2022-07-23 ENCOUNTER — Inpatient Hospital Stay: Payer: Self-pay

## 2022-07-23 VITALS — BP 134/81 | HR 72 | Temp 97.7°F | Resp 14 | Wt 125.3 lb

## 2022-07-23 DIAGNOSIS — R509 Fever, unspecified: Secondary | ICD-10-CM | POA: Insufficient documentation

## 2022-07-23 DIAGNOSIS — C880 Waldenstrom macroglobulinemia: Secondary | ICD-10-CM

## 2022-07-23 DIAGNOSIS — J189 Pneumonia, unspecified organism: Secondary | ICD-10-CM

## 2022-07-23 LAB — CMP (CANCER CENTER ONLY)
ALT: 9 U/L (ref 0–44)
AST: 12 U/L — ABNORMAL LOW (ref 15–41)
Albumin: 3.7 g/dL (ref 3.5–5.0)
Alkaline Phosphatase: 84 U/L (ref 38–126)
Anion gap: 5 (ref 5–15)
BUN: 11 mg/dL (ref 8–23)
CO2: 24 mmol/L (ref 22–32)
Calcium: 8.8 mg/dL — ABNORMAL LOW (ref 8.9–10.3)
Chloride: 105 mmol/L (ref 98–111)
Creatinine: 0.73 mg/dL (ref 0.61–1.24)
GFR, Estimated: >60 mL/min (ref >60)
Glucose, Bld: 83 mg/dL (ref 70–99)
Potassium: 4 mmol/L (ref 3.5–5.1)
Sodium: 134 mmol/L — ABNORMAL LOW (ref 135–145)
Total Bilirubin: 0.4 mg/dL (ref 0.3–1.2)
Total Protein: 6.1 g/dL — ABNORMAL LOW (ref 6.5–8.1)

## 2022-07-23 LAB — URINALYSIS, COMPLETE (UACMP) WITH MICROSCOPIC
Bacteria, UA: NONE SEEN
Bilirubin Urine: NEGATIVE
Glucose, UA: >=500 mg/dL — AB
Hgb urine dipstick: NEGATIVE
Ketones, ur: NEGATIVE mg/dL
Leukocytes,Ua: NEGATIVE
Nitrite: NEGATIVE
Protein, ur: NEGATIVE mg/dL
Specific Gravity, Urine: 1.02 (ref 1.005–1.030)
pH: 5 (ref 5.0–8.0)

## 2022-07-23 LAB — CBC WITH DIFFERENTIAL (CANCER CENTER ONLY)
Abs Immature Granulocytes: 0.02 10*3/uL (ref 0.00–0.07)
Basophils Absolute: 0.1 10*3/uL (ref 0.0–0.1)
Basophils Relative: 1 %
Eosinophils Absolute: 0.7 10*3/uL — ABNORMAL HIGH (ref 0.0–0.5)
Eosinophils Relative: 9 %
HCT: 33.6 % — ABNORMAL LOW (ref 39.0–52.0)
Hemoglobin: 11.3 g/dL — ABNORMAL LOW (ref 13.0–17.0)
Immature Granulocytes: 0 %
Lymphocytes Relative: 19 %
Lymphs Abs: 1.6 10*3/uL (ref 0.7–4.0)
MCH: 28 pg (ref 26.0–34.0)
MCHC: 33.6 g/dL (ref 30.0–36.0)
MCV: 83.2 fL (ref 80.0–100.0)
Monocytes Absolute: 0.6 10*3/uL (ref 0.1–1.0)
Monocytes Relative: 8 %
Neutro Abs: 5.1 10*3/uL (ref 1.7–7.7)
Neutrophils Relative %: 63 %
Platelet Count: 415 10*3/uL — ABNORMAL HIGH (ref 150–400)
RBC: 4.04 MIL/uL — ABNORMAL LOW (ref 4.22–5.81)
RDW: 15.4 % (ref 11.5–15.5)
WBC Count: 8.1 10*3/uL (ref 4.0–10.5)
nRBC: 0 % (ref 0.0–0.2)

## 2022-07-23 MED ORDER — AZITHROMYCIN 250 MG PO TABS
ORAL_TABLET | ORAL | 0 refills | Status: AC
  Filled 2022-07-23: qty 6, 5d supply, fill #0

## 2022-07-23 MED ORDER — METHYLPREDNISOLONE 4 MG PO TBPK
ORAL_TABLET | ORAL | 0 refills | Status: AC
  Filled 2022-07-23: qty 21, 6d supply, fill #0

## 2022-07-23 MED ORDER — AMOXICILLIN-POT CLAVULANATE 875-125 MG PO TABS
1.0000 | ORAL_TABLET | Freq: Two times a day (BID) | ORAL | 0 refills | Status: AC
  Filled 2022-07-23: qty 14, 7d supply, fill #0

## 2022-07-24 LAB — URINE CULTURE: Culture: NO GROWTH

## 2022-07-27 ENCOUNTER — Telehealth: Payer: Self-pay | Admitting: *Deleted

## 2022-07-27 ENCOUNTER — Telehealth (HOSPITAL_COMMUNITY): Payer: Self-pay

## 2022-07-27 ENCOUNTER — Other Ambulatory Visit (HOSPITAL_COMMUNITY): Payer: Self-pay

## 2022-07-27 NOTE — Telephone Encounter (Signed)
Connor Solomon is asking if he can go back on the chemo pill. He is concerned that the cancer will spread while he is off. States the rash on his arms is much better, bumps are gone- still some redness.

## 2022-07-27 NOTE — Telephone Encounter (Signed)
Advanced Heart Failure Patient Advocate Encounter  Received a patient assistance renewal application for Entresto. Assistance program is NOT being renewed at this time, as the patient has new insurance and can afford copay.  Clista Bernhardt, CPhT Rx Patient Advocate Phone: [phone]

## 2022-07-27 NOTE — Telephone Encounter (Signed)
Notified that it is OK to restart chemo pills

## 2022-07-28 LAB — CULTURE, BLOOD (ROUTINE X 2)
Culture: NO GROWTH
Culture: NO GROWTH

## 2022-07-29 ENCOUNTER — Other Ambulatory Visit: Payer: Self-pay | Admitting: Nurse Practitioner

## 2022-07-29 ENCOUNTER — Other Ambulatory Visit (HOSPITAL_COMMUNITY): Payer: Self-pay

## 2022-07-29 DIAGNOSIS — G893 Neoplasm related pain (acute) (chronic): Secondary | ICD-10-CM

## 2022-07-29 DIAGNOSIS — Z515 Encounter for palliative care: Secondary | ICD-10-CM

## 2022-07-29 DIAGNOSIS — C880 Waldenstrom macroglobulinemia: Secondary | ICD-10-CM

## 2022-07-29 MED ORDER — OXYCODONE HCL 10 MG PO TABS
10.0000 mg | ORAL_TABLET | ORAL | 0 refills | Status: DC | PRN
  Filled 2022-07-29: qty 90, 15d supply, fill #0

## 2022-07-30 ENCOUNTER — Other Ambulatory Visit (HOSPITAL_COMMUNITY): Payer: Self-pay

## 2022-08-02 ENCOUNTER — Other Ambulatory Visit (HOSPITAL_COMMUNITY): Payer: Self-pay

## 2022-08-05 ENCOUNTER — Inpatient Hospital Stay: Payer: Self-pay

## 2022-08-05 ENCOUNTER — Other Ambulatory Visit: Payer: Self-pay

## 2022-08-05 ENCOUNTER — Inpatient Hospital Stay (HOSPITAL_BASED_OUTPATIENT_CLINIC_OR_DEPARTMENT_OTHER): Payer: Self-pay | Admitting: Hematology and Oncology

## 2022-08-05 ENCOUNTER — Other Ambulatory Visit (HOSPITAL_COMMUNITY): Payer: Self-pay

## 2022-08-05 ENCOUNTER — Encounter: Payer: Self-pay | Admitting: Nurse Practitioner

## 2022-08-05 ENCOUNTER — Inpatient Hospital Stay (HOSPITAL_BASED_OUTPATIENT_CLINIC_OR_DEPARTMENT_OTHER): Payer: Self-pay | Admitting: Nurse Practitioner

## 2022-08-05 VITALS — BP 125/79 | HR 64 | Temp 98.3°F | Resp 15 | Wt 127.3 lb

## 2022-08-05 DIAGNOSIS — G893 Neoplasm related pain (acute) (chronic): Secondary | ICD-10-CM

## 2022-08-05 DIAGNOSIS — G4700 Insomnia, unspecified: Secondary | ICD-10-CM

## 2022-08-05 DIAGNOSIS — F419 Anxiety disorder, unspecified: Secondary | ICD-10-CM

## 2022-08-05 DIAGNOSIS — C8800 Waldenstrom macroglobulinemia not having achieved remission: Secondary | ICD-10-CM

## 2022-08-05 DIAGNOSIS — Z515 Encounter for palliative care: Secondary | ICD-10-CM

## 2022-08-05 DIAGNOSIS — R530 Neoplastic (malignant) related fatigue: Secondary | ICD-10-CM

## 2022-08-05 DIAGNOSIS — K5903 Drug induced constipation: Secondary | ICD-10-CM

## 2022-08-05 LAB — CMP (CANCER CENTER ONLY)
ALT: 14 U/L (ref 0–44)
AST: 13 U/L — ABNORMAL LOW (ref 15–41)
Albumin: 3.6 g/dL (ref 3.5–5.0)
Alkaline Phosphatase: 51 U/L (ref 38–126)
Anion gap: 3 — ABNORMAL LOW (ref 5–15)
BUN: 13 mg/dL (ref 8–23)
CO2: 24 mmol/L (ref 22–32)
Calcium: 8.4 mg/dL — ABNORMAL LOW (ref 8.9–10.3)
Chloride: 107 mmol/L (ref 98–111)
Creatinine: 0.78 mg/dL (ref 0.61–1.24)
GFR, Estimated: >60 mL/min (ref >60)
Glucose, Bld: 106 mg/dL — ABNORMAL HIGH (ref 70–99)
Potassium: 4.3 mmol/L (ref 3.5–5.1)
Sodium: 134 mmol/L — ABNORMAL LOW (ref 135–145)
Total Bilirubin: 0.5 mg/dL (ref 0.3–1.2)
Total Protein: 5.3 g/dL — ABNORMAL LOW (ref 6.5–8.1)

## 2022-08-05 LAB — CBC WITH DIFFERENTIAL (CANCER CENTER ONLY)
Abs Immature Granulocytes: 0.01 10*3/uL (ref 0.00–0.07)
Basophils Absolute: 0 10*3/uL (ref 0.0–0.1)
Basophils Relative: 1 %
Eosinophils Absolute: 1.1 10*3/uL — ABNORMAL HIGH (ref 0.0–0.5)
Eosinophils Relative: 16 %
HCT: 37.6 % — ABNORMAL LOW (ref 39.0–52.0)
Hemoglobin: 12.4 g/dL — ABNORMAL LOW (ref 13.0–17.0)
Immature Granulocytes: 0 %
Lymphocytes Relative: 33 %
Lymphs Abs: 2.2 10*3/uL (ref 0.7–4.0)
MCH: 28.1 pg (ref 26.0–34.0)
MCHC: 33 g/dL (ref 30.0–36.0)
MCV: 85.1 fL (ref 80.0–100.0)
Monocytes Absolute: 0.5 10*3/uL (ref 0.1–1.0)
Monocytes Relative: 7 %
Neutro Abs: 2.9 10*3/uL (ref 1.7–7.7)
Neutrophils Relative %: 43 %
Platelet Count: 267 10*3/uL (ref 150–400)
RBC: 4.42 MIL/uL (ref 4.22–5.81)
RDW: 15.9 % — ABNORMAL HIGH (ref 11.5–15.5)
WBC Count: 6.7 10*3/uL (ref 4.0–10.5)
nRBC: 0 % (ref 0.0–0.2)

## 2022-08-05 MED ORDER — METHADONE HCL 10 MG PO TABS
10.0000 mg | ORAL_TABLET | Freq: Three times a day (TID) | ORAL | 0 refills | Status: DC
  Filled 2022-08-07: qty 90, 30d supply, fill #0

## 2022-08-05 MED ORDER — DIAZEPAM 5 MG PO TABS
5.0000 mg | ORAL_TABLET | Freq: Every evening | ORAL | 0 refills | Status: DC | PRN
  Filled 2022-08-05: qty 15, 15d supply, fill #0

## 2022-08-05 NOTE — Progress Notes (Signed)
[place]  Telephone:([phone]) [phone] Fax:(336) [phone]   Name: Connor Solomon Date: 08/05/2022 MRN: [id number]  DOB: Feb 20, 1961  Patient Care Team: Delene Ruffini, MD as PCP - General (Internal Medicine) Pickenpack-Cousar, Carlena Sax, NP as Nurse Practitioner (Nurse Practitioner)     REASON FOR CONSULTATION: Connor Solomon is a 61 y.o. 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 Palliative ask to see for symptom management.  SOCIAL HISTORY:     reports that he quit smoking about 21 months ago. His smoking use included cigars. He has never used smokeless tobacco. He reports that he does not drink alcohol and does not use drugs.  ADVANCE DIRECTIVES:    CODE STATUS:   PAST MEDICAL HISTORY: Past Medical History:  Diagnosis Date   Acute heart failure ([place]) 04/08/2021   Acute HFrEF (heart failure with reduced ejection fraction) ([place]) 04/07/2021   Arthritis    hands, elbows bilaterally   Cancer ([facility])    Full dentures    Hypertension    Pt states he doen not have HTN and has never been treated for HTN   Kidney stone on left side last stone 4-5 yrs ago   recurrent (3 episodes)   Panic disorder    pt states has resolved   Syncope    resolved- was related to panic attacks    PAST SURGICAL HISTORY:  Past Surgical History:  Procedure Laterality Date   CARPAL METACARPAL FUSION WITH DISTAL RADIAL BONE GRAFT Left 05/30/2013   Procedure: LEFT THUMB METACARPAL JOINT FUSION;  Surgeon: Schuyler Amor, MD;  Location: [place];  Service: Orthopedics;  Laterality: Left;   ESOPHAGOGASTRODUODENOSCOPY N/A 02/12/2014   Procedure: ESOPHAGOGASTRODUODENOSCOPY (EGD);  Surgeon: Jerene Bears, MD;  Location: [facility] ENDOSCOPY;  Service: Endoscopy;  Laterality: N/A;   FOREIGN BODY REMOVAL N/A 02/12/2014   Procedure: FOREIGN BODY REMOVAL;  Surgeon: Jerene Bears, MD;  Location: [place];  Service: Endoscopy;  Laterality: N/A;   OPEN REDUCTION INTERNAL FIXATION (ORIF) DISTAL RADIAL FRACTURE Left 11/29/2012   Procedure: OPEN REDUCTION INTERNAL FIXATION (ORIF) DISTAL RADIAL FRACTURE;  Surgeon: Schuyler Amor, MD;  Location: [place];  Service: Orthopedics;  Laterality: Left;  LEFT DISTAL RADIUS OSTEOTOMY WITH BONE GRAFT   RIGHT/LEFT HEART CATH AND CORONARY ANGIOGRAPHY N/A 04/09/2021   Procedure: RIGHT/LEFT HEART CATH AND CORONARY ANGIOGRAPHY;  Surgeon: Jolaine Artist, MD;  Location: [place] [facility];  Service: Cardiovascular;  Laterality: N/A;   TENDON REPAIR Left 02/07/2013   Procedure: LEFT EXTENSOR INDICIS PROPRIUS  TO EXTENSOR POLLICIS LONGUS TENDON TRANSFER;  Surgeon: Schuyler Amor, MD;  Location: [place];  Service: Orthopedics;  Laterality: Left;   WISDOM TOOTH EXTRACTION     WRIST SURGERY     fx only    HEMATOLOGY/ONCOLOGY HISTORY:  Oncology History  Waldenstrom macroglobulinemia ([place])  05/24/2021 Initial Diagnosis   Waldenstrom macroglobulinemia ([place])   06/05/2021 - 12/30/2021 Chemotherapy   Patient is on Treatment Plan : NON-HODGKINS LYMPHOMA Rituximab q7d       ALLERGIES:  is allergic to rituxan [rituximab] and hydrocodone.  MEDICATIONS:  Current Outpatient Medications  Medication Sig Dispense Refill   diazepam (VALIUM) 5 MG tablet Take 1 tablet (5 mg total) by mouth at bedtime as needed for anxiety. 15 tablet 0   acetaminophen (TYLENOL) 500 MG tablet Take 500 mg by mouth every 6 (six) hours as  needed.     albuterol (PROVENTIL HFA) 108 (90 Base) MCG/ACT inhaler Inhale 2 puffs into the lungs every 6 hours as needed for wheezing or shortness of breath 6.7 g 2   carvedilol (COREG) 3.125 MG tablet Take 1 tablet by mouth 2 times daily. 60 tablet 6   dapagliflozin propanediol (FARXIGA) 10 MG TABS tablet Take 1 tablet (10 mg total) by mouth daily before breakfast. 30 tablet 11    dronabinol (MARINOL) 2.5 MG capsule Take 1 capsule by mouth 2 times daily before a meal. 60 capsule 0   eplerenone (INSPRA) 25 MG tablet Take 1/2 tablet (12.5 mg total) by mouth daily. 15 tablet 3   escitalopram (LEXAPRO) 5 MG tablet Take 1 tablet (5 mg total) by mouth daily. 30 tablet 0   ferrous sulfate 325 (65 FE) MG tablet Take 1 tablet by mouth daily with breakfast. Please take with a source of Vitamin C 90 tablet 3   furosemide (LASIX) 20 MG tablet Take 1 tablet (20 mg total) by mouth daily as needed for swelling 100 tablet 2   ibrutinib (IMBRUVICA) 420 MG tablet Take 1 tablet (420 mg total) by mouth daily. 30 tablet 2   megestrol (MEGACE) 20 MG tablet Take 1 tablet (20 mg total) by mouth daily. 30 tablet 1   [START ON 08/07/2022] methadone (DOLOPHINE) 10 MG tablet Take 1 tablet by mouth every 8 hours. 90 tablet 0   metoCLOPramide (REGLAN) 10 MG tablet Take 1 tablet (10 mg total) by mouth every 8 (eight) hours as needed for nausea. 30 tablet 1   naloxone (NARCAN) nasal spray 4 mg/0.1 mL Take for overdose 1 each 0   naloxone (NARCAN) nasal spray 4 mg/0.1 mL Take for overdose as directed 2 each 0   ondansetron (ZOFRAN) 8 MG tablet Take 1 tablet (8 mg total) by mouth every 8 (eight) hours as needed. 30 tablet 0   Oxycodone HCl 10 MG TABS Take 1 tablet (10 mg total) by mouth every 4 (four) hours as needed. 90 tablet 0   potassium chloride SA (KLOR-CON M) 20 MEQ tablet Take 1 tablet (20 mEq total) by mouth daily. 30 tablet 1   prochlorperazine (COMPAZINE) 10 MG tablet Take 1 tablet by mouth every 6 hours as needed for nausea or vomiting. 60 tablet 1   sacubitril-valsartan (ENTRESTO) 24-26 MG Take 1 tablet by mouth 2 (two) times daily. 180 tablet 3   No current facility-administered medications for this visit.    VITAL SIGNS: There were no vitals taken for this visit. There were no vitals filed for this visit.    Estimated body mass index is 19.36 kg/m as calculated from the following:    Height as of 07/06/22: 5\' 8"  (1.727 m).   Weight as of an earlier encounter on 08/05/22: 127 lb 4.8 oz (57.7 kg).   PERFORMANCE STATUS (ECOG) : 1 - Symptomatic but completely ambulatory   IMPRESSION:  [name] presented to the clinic today for close symptom management follow-up. No acute distress. Ambulatory. Hands and feet continue improve. Is feeling much better today compared to previous visit.    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 He is tolerating Methadone 10mg  three times daily. Denies drowsiness. Oxycodone 10mg  as needed for breakthrough pain.   We will plan to continue close monitoring and adjust as needed.   Constipation Reports daily or every other day bowel movements. Education provided on opioid induced constipation. Recommended Miralax daily. If no bowel movement in 48 hours he  knows to increase to twice daily.   Appetite/decreased appetite.  Tolerating marinol for his appetite.   Is trying to drink at least 1-2 ensure daily for additional support.   Weight  is stable at 127lbs.    Education provided on eating small frequent meals versus focusing on 3 large meals per day. Protein enriched foots. Eating when he has a desire. Denies nausea/vomiting. ll continue to closely monitor.   He has an appetite at times. Other times when he has a desire once he begins eating interest diminishes. Education provided on appetite stimulant.   We will continue to closely monitor.   4. Anxiety/Anxiousness  He continues to have some anxiety and increased insomnia. Lexapro has been discontinued. We will try him on valium and see how he tolerates and if some improvement. Education provided on use and potential side effects. We will continue to closely monitor.   Dandrea was started on escitalopram during office visit on 5/31. Dose was increased to 10mg  on 6/14. Tolerating well. Encouraged to continue with medication as prescribed allowing ongoing monitoring and adjustments as  needed. He verbalized understanding and appreciation.   Kedron shares concerns with ongoing anxiety and anxiousness. States his mind sometimes wanders making him have increased anxiety thinking about the unknown and what ifs. Emotional support provided. He took one of his relative's Xanax and felt this helped him tremendously. Education provided on importance of not taking medications not prescribed as this could interfere with other medications or cause complications. We also discussed concerns with certain medications which could contribute to respiratory depression, lethargy in correlation with his pain medications.   He is emotional regarding his anxiety. Wife expresses concerns over this as well. Education provided on use of more long-term medication which can provide similar relief as Xanax. We will plan to start on Lexapro daily. Education provided on side effects in addition to importance on not stopping abruptly and taking as prescribed. He understands it may take some time for effectiveness and will not be an immediate response.   We will closely monitor.   5. Goals of care (5/16) Mr. Mcjunkins shares his realistic understanding of his condition. He and his wife are making sure all "affairs" are in order as his biggest worry is leaving his wife in a financial constraint. Reports communicating with his life insurance policy etc. Emotional support provided.   He is emotional expressing racing thoughts during idol time or interruptions in his sleep as his mind wonders thinking about worst case scenario. Is requesting something to assist with sleeping and anxiety. Acknowledged his request. Education provided on limited use in collaboration with other medications including zyprexa. He verbalized understanding and aware  we will continue to support and monitor for needs.    PLAN: methadone 10 mg every 8 hours. Close monitoring. Tolerating well. Pain controlled. Oxycodone 10 mg as needed for  breakthrough pain.  Miralax daily for constipation Valium 5mg  at bedtime. Zofran as needed for nausea as prescribed.  Marinol for appetite  I will plan to see patient back in 4-6 weeks in collaboration to other oncology appointments. We will schedule phone follow-up in 1-2 weeks for close symptom management follow-up.    Patient expressed understanding and was in agreement with this plan. He also understands that He can call the clinic at any time with any questions, concerns, or complaints.   Thank you for your referral and allowing Palliative to assist in Mr. Mervin Ramires [facility] care.      Any controlled  substances utilized were prescribed in the context of palliative care. PDMP has been reviewed.    Time Total: 20 min   Visit consisted of counseling and education dealing with the complex and emotionally intense issues of symptom management and palliative care in the setting of serious and potentially life-threatening illness.Greater than 50%  of this time was spent counseling and coordinating care related to the above assessment and plan.  Alda Lea, AGPCNP-BC  Palliative Medicine Team/[facility]

## 2022-08-05 NOTE — Progress Notes (Signed)
Connor [phone]) [phone]   [phone]  PROGRESS NOTE  Patient Care Team: Delene Ruffini, MD as PCP - General (Internal Medicine) Pickenpack-Cousar, Carlena Sax, NP as Nurse Practitioner (Nurse Practitioner)  Hematological/Oncological History # Waldenstrom's Macroglobulinemia # IgM Monoclonal Gammopathy 04/07/2021: CT A/p showed mildly enlarged retroperitoneal lymph nodes and bilateral inguinal lymph nodes.  04/08/2021: IgM Kappa M protein 1.2 04/09/2021: WBC 11.1, Hgb 9.5, MCV 75.4, Plt 525 04/20/2021: Kappa 977, Lambda 7.8, K/L ratio 125.28. Serum viscosity 1.8 05/06/2021: Establish care with Dr. 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 Infusion reaction with ritux, held halfway through.  06/12/2021: Cycle 2 Day 1 of Ibrutinib + Rituximab 06/19/2021: Cycle 3 Day 1 of Ibrutinib + Rituximab 06/26/2021: Cycle 4 Day 1 of rituximab. Continued on daily ibrutinib 12/02/2021: Cycle 5 Day 1 of Ibrutinib + Rituximab 12/09/2021: Cycle 6 Day 1 of Ibrutinib + Rituximab 12/16/2021: Cycle 7 Day 1 of Ibrutinib + Rituximab 12/23/2021: Held Cycle 8 of Ritxumab due to nausea/vomiting, elevated creatinine, finger infection  Interval History:  Connor Solomon 61 y.o. male with medical history significant for Waldenstrom's macroglobulinemia who presents for a follow up visit. The patient's last visit was on 07/06/2022. In the interim since the last visit he has continued on Ibrutinib therapy.   On exam today Connor Solomon reports his primary symptom continues to be fatigue.  He reports he has more worn out throughout the day.  He notes that ever since starting dronabinol he has had improvement in his nausea and vomiting.  He reports that this is his third or fourth day with no episodes of vomiting.  He  reports that he is also increasing his appetite and he is happy with the results.  He reports he does still have nausea however.  He notes that his pain is currently under control and he reports it is a 3 out of 10 in severity.  He does still have some joint pain and hip pain which may be secondary to his medication.  He is not suffering from any constipation due to his opioid medications.  Overall he is willing and able to proceed with ibrutinib therapy at this time. He denies fevers, chills, night sweats, shortness of breath, chest pain or cough. He has no other complaints. Full 10 point ROS is listed below.   MEDICAL HISTORY:  Past Medical History:  Diagnosis Date   Acute heart failure ([place]) 04/08/2021   Acute HFrEF (heart failure with reduced ejection fraction) ([place]) 04/07/2021   Arthritis    hands, elbows bilaterally   Cancer ([facility])    Full dentures    Hypertension    Pt states he doen not have HTN and has never been treated for HTN   Kidney stone on left side last stone 4-5 yrs ago   recurrent (3 episodes)   Panic disorder    pt states has resolved   Syncope    resolved- was related to panic attacks    SURGICAL HISTORY: Past Surgical History:  Procedure Laterality Date   CARPAL METACARPAL FUSION WITH DISTAL RADIAL BONE GRAFT Left 05/30/2013   Procedure: LEFT THUMB METACARPAL JOINT FUSION;  Surgeon: Schuyler Amor, MD;  Location: [facility];  Service: Orthopedics;  Laterality: Left;   ESOPHAGOGASTRODUODENOSCOPY N/A 02/12/2014   Procedure: ESOPHAGOGASTRODUODENOSCOPY (EGD);  Surgeon: Jerene Bears, MD;  Location: [facility];  Service: Endoscopy;  Laterality: N/A;   FOREIGN BODY REMOVAL N/A 02/12/2014   Procedure: FOREIGN BODY REMOVAL;  Surgeon: Jerene Bears, MD;  Location: [facility];  Service: Endoscopy;  Laterality: N/A;   OPEN REDUCTION INTERNAL FIXATION (ORIF) DISTAL RADIAL FRACTURE Left 11/29/2012   Procedure: OPEN REDUCTION INTERNAL FIXATION (ORIF) DISTAL  RADIAL FRACTURE;  Surgeon: Schuyler Amor, MD;  Location: [facility];  Service: Orthopedics;  Laterality: Left;  LEFT DISTAL RADIUS OSTEOTOMY WITH BONE GRAFT   RIGHT/LEFT HEART CATH AND CORONARY ANGIOGRAPHY N/A 04/09/2021   Procedure: RIGHT/LEFT HEART CATH AND CORONARY ANGIOGRAPHY;  Surgeon: Jolaine Artist, MD;  Location: [facility];  Service: Cardiovascular;  Laterality: N/A;   TENDON REPAIR Left 02/07/2013   Procedure: LEFT EXTENSOR INDICIS PROPRIUS  TO EXTENSOR POLLICIS LONGUS TENDON TRANSFER;  Surgeon: Schuyler Amor, MD;  Location: [place] [facility] [place];  Service: Orthopedics;  Laterality: Left;   WISDOM TOOTH EXTRACTION     WRIST SURGERY     fx only    SOCIAL HISTORY: Social History   Socioeconomic History   Marital status: Single    Spouse name: Not on file   Number of children: Not on file   Years of education: Not on file   Highest education level: Not on file  Occupational History   Not on file  Tobacco Use   Smoking status: Former    Types: Cigars    Quit date: 10/11/2020    Years since quitting: 1.8   Smokeless tobacco: Never   Tobacco comments:    smokes 1 cigar a week  Vaping Use   Vaping Use: Never used  Substance and Sexual Activity   Alcohol use: No   Drug use: No   Sexual activity: Not on file  Other Topics Concern   Not on file  Social History Narrative   Not on file   Social Determinants of Health   Financial Resource Strain: High Risk (07/17/2021)   Overall Financial Resource Strain (CARDIA)    Difficulty of Paying Living Expenses: Very hard  Food Insecurity: Food Insecurity Present (04/10/2021)   Hunger Vital Sign    Worried About [place] in the Last Year: Sometimes true    Ran Out of Food in the Last Year: Sometimes true  Transportation Needs: No Transportation Needs (04/10/2021)   PRAPARE - Hydrologist (Medical): No    Lack of Transportation (Non-Medical): No  Physical  Activity: Not on file  Stress: Stress Concern Present (09/01/2021)   [place]    Feeling of Stress : Very much  Social Connections: Not on file  Intimate Partner Violence: Not on file    FAMILY HISTORY: Family History  Problem Relation Age of Onset   Heart attack Father    Sudden Cardiac Death Father 18   Diabetes Neg Hx    Hyperlipidemia Neg Hx    Hypertension Neg Hx     ALLERGIES:  is allergic to rituxan [rituximab] and hydrocodone.  MEDICATIONS:  Current Outpatient Medications  Medication Sig Dispense Refill   acetaminophen (TYLENOL) 500 MG tablet Take 500 mg by mouth every 6 (six) hours as needed.     albuterol (PROVENTIL HFA) 108 (90 Base) MCG/ACT inhaler Inhale 2 puffs into the lungs every 6 hours as needed for wheezing or shortness of breath 6.7 g 2   carvedilol (COREG) 3.125 MG tablet Take 1 tablet by mouth 2 times  daily. 60 tablet 6   dapagliflozin propanediol (FARXIGA) 10 MG TABS tablet Take 1 tablet (10 mg total) by mouth daily before breakfast. 30 tablet 11   dronabinol (MARINOL) 2.5 MG capsule Take 1 capsule by mouth 2 times daily before a meal. 60 capsule 0   eplerenone (INSPRA) 25 MG tablet Take 1/2 tablet (12.5 mg total) by mouth daily. 15 tablet 3   escitalopram (LEXAPRO) 5 MG tablet Take 1 tablet (5 mg total) by mouth daily. 30 tablet 0   ferrous sulfate 325 (65 FE) MG tablet Take 1 tablet by mouth daily with breakfast. Please take with a source of Vitamin C 90 tablet 3   furosemide (LASIX) 20 MG tablet Take 1 tablet (20 mg total) by mouth daily as needed for swelling 100 tablet 2   ibrutinib (IMBRUVICA) 420 MG tablet Take 1 tablet (420 mg total) by mouth daily. 30 tablet 2   megestrol (MEGACE) 20 MG tablet Take 1 tablet (20 mg total) by mouth daily. 30 tablet 1   methadone (DOLOPHINE) 10 MG tablet Take 1 tablet by mouth every 8 hours. 90 tablet 0   metoCLOPramide (REGLAN) 10 MG tablet Take 1  tablet (10 mg total) by mouth every 8 (eight) hours as needed for nausea. 30 tablet 1   naloxone (NARCAN) nasal spray 4 mg/0.1 mL Take for overdose 1 each 0   naloxone (NARCAN) nasal spray 4 mg/0.1 mL Take for overdose as directed 2 each 0   ondansetron (ZOFRAN) 8 MG tablet Take 1 tablet (8 mg total) by mouth every 8 (eight) hours as needed. 30 tablet 0   Oxycodone HCl 10 MG TABS Take 1 tablet (10 mg total) by mouth every 4 (four) hours as needed. 90 tablet 0   potassium chloride SA (KLOR-CON M) 20 MEQ tablet Take 1 tablet (20 mEq total) by mouth daily. 30 tablet 1   prochlorperazine (COMPAZINE) 10 MG tablet Take 1 tablet by mouth every 6 hours as needed for nausea or vomiting. 60 tablet 1   sacubitril-valsartan (ENTRESTO) 24-26 MG Take 1 tablet by mouth 2 (two) times daily. 180 tablet 3   No current facility-administered medications for this visit.    REVIEW OF SYSTEMS:   Constitutional: ( - ) fevers, ( - )  chills , ( - ) night sweats Eyes: ( - ) blurriness of vision, ( - ) double vision, ( - ) watery eyes Ears, nose, mouth, throat, and face: ( - ) mucositis, ( - ) sore throat Respiratory: ( - ) cough, ( - ) dyspnea, ( - ) wheezes Cardiovascular: ( - ) palpitation, ( - ) chest discomfort, ( - ) lower extremity swelling Gastrointestinal:  ( + ) nausea, ( - ) heartburn, ( - ) change in bowel habits Skin: ( - ) abnormal skin rashes Lymphatics: ( - ) new lymphadenopathy, ( - ) easy bruising Neurological: ( - ) numbness, ( - ) tingling, ( - ) new weaknesses Behavioral/Psych: ( - ) mood change, ( - ) new changes  All other systems were reviewed with the patient and are negative.  PHYSICAL EXAMINATION: ECOG PERFORMANCE STATUS: 1 - Symptomatic but completely ambulatory  Vitals:   08/05/22 1053  BP: 125/79  Pulse: 64  Resp: 15  Temp: 98.3 F (36.8 C)  SpO2: 99%      Filed Weights   08/05/22 1053  Weight: 127 lb 4.8 oz (57.7 kg)     GENERAL: Chronically ill appearing middle-aged  Caucasian male, alert, no distress  and comfortable SKIN: skin color, texture, turgor are normal, no rashes or significant lesions.  EYES: conjunctiva are pink and non-injected, sclera clear LUNGS: clear to auscultation and percussion with normal breathing effort HEART: regular rate & rhythm and no murmurs and no lower extremity edema  PSYCH: alert & oriented x 3, fluent speech NEURO: no focal motor/sensory deficits   LABORATORY DATA:  I have reviewed the data as listed    Latest Ref Rng & Units 08/05/2022   10:36 AM 07/23/2022   11:23 AM 07/06/2022    9:59 AM  CBC  WBC 4.0 - 10.5 K/uL 6.7  8.1  5.4   Hemoglobin 13.0 - 17.0 g/dL 12.4  11.3  10.4   Hematocrit 39.0 - 52.0 % 37.6  33.6  30.5   Platelets 150 - 400 K/uL 267  415  269        Latest Ref Rng & Units 07/23/2022   11:23 AM 07/06/2022    9:59 AM 06/10/2022   10:38 AM  CMP  Glucose 70 - 99 mg/dL 83  93  140   BUN 8 - 23 mg/dL $Remove'11  14  11   'sSFkEBf$ Creatinine 0.61 - 1.24 mg/dL 0.73  0.82  0.75   Sodium 135 - 145 mmol/L 134  136  136   Potassium 3.5 - 5.1 mmol/L 4.0  2.9  3.4   Chloride 98 - 111 mmol/L 105  105  108   CO2 22 - 32 mmol/L $RemoveB'24  24  22   'jkibfGjl$ Calcium 8.9 - 10.3 mg/dL 8.8  8.3  9.2   Total Protein 6.5 - 8.1 g/dL 6.1  5.4  6.0   Total Bilirubin 0.3 - 1.2 mg/dL 0.4  0.6  0.5   Alkaline Phos 38 - 126 U/L 84  70  55   AST 15 - 41 U/L $Remo'12  12  13   'mIvcW$ ALT 0 - 44 U/L $Remo'9  8  10     'KbvCN$ Lab Results  Component Value Date   MPROTEIN 0.1 (H) 07/06/2022   MPROTEIN 0.1 (H) 06/10/2022   MPROTEIN 0.1 (H) 05/04/2022   Lab Results  Component Value Date   KPAFRELGTCHN 26.2 (H) 07/06/2022   KPAFRELGTCHN 21.4 (H) 06/10/2022   KPAFRELGTCHN 26.3 (H) 05/04/2022   LAMBDASER 4.3 (L) 07/06/2022   LAMBDASER 4.1 (L) 06/10/2022   LAMBDASER 1.6 (L) 05/04/2022   KAPLAMBRATIO 6.09 (H) 07/06/2022   KAPLAMBRATIO 5.22 (H) 06/10/2022   KAPLAMBRATIO 16.44 (H) 05/04/2022    RADIOGRAPHIC STUDIES: DG Chest 2 View  Result Date: 07/23/2022 CLINICAL DATA:   Fever EXAM: CHEST - 2 VIEW COMPARISON:  CXR 01/28/22 FINDINGS: Slight interval improvement in previously seen bilateral patchy airspace opacities. Compared to prior exam there is a new left basilar opacity. The heart size and mediastinal contours are within normal limits. Both lungs are clear. The visualized skeletal structures are unremarkable. IMPRESSION: Compared to prior exam there is a new left basilar opacity which could be due to atelectasis or pneumonia. Electronically Signed   By: Marin Roberts M.D.   On: 07/23/2022 14:17    ASSESSMENT & PLAN Connor Solomon 61 y.o. male with medical history significant for Waldenstrom's macroglobulinemia who presents for a follow up visit.   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  There is no clear evidence of hyperviscosity syndrome at this time.  At this time we will plan to proceed  with ibrutinib and rituximab therapy.  One could also consider Bendamustine and rituximab therapy but given the patient's degree of anemia I would prefer pursuing ibrutinib and rituximab at this time.  Additionally ibrutinib/rituximab are category 1 recommendations per the NCCN guidelines.  The treatment will consist of ibrutinib 420 mg p.o. daily with rituximab on weeks 1 through 4 as well as weeks 27 through 30.  Previously we discussed the risks and benefits of therapy including (but not limited to) risk of bleeding, fatigue, atypical infections, and cardiac arrhythmias.  The patient voices understanding of this plan moving forward.   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 --Started second round of weekly rituximab on 12/02/2021. Continued weekly x 4 weeks.   --last rituximab dose held due to missed visits/illness.  Plan: --Labs today were reviewed without any intervention needed.  Labs show white cell count 6.7, hemoglobin 12.4, MCV 85.1, and platelets of 267.  Pending SPEP and sFLC levels but prior levels appear stable. --continues on ibrutinib 420 mg PO daily.  --RTC in 4 weeks with labs   # Hypokalemia-improving -- Potassium 4.3, continue to monitor   #Pain Management --Pain was poorly controlled on hydrocodone therapy previously.  --Patient was previously admitted for for opioid overdose with presumed fentanyl --Currently pain regimen includes methadone 10 mg TID per palliative care.  -- Appreciate recommendations of palliative care.  #Normocytic Anemia  --Hgb 12.4 , Plt 267 --previously there was a component of iron deficiency anemia as well.  Currently on PO ferrous sulfate to try and bolster his iron levels.  --Continue to monitor.  #Nausea and vomiting--improving -- Concern that the nausea and vomiting may not be related to the ibrutinib therapy and potentially tied to poor gastric motility due to opioid prescription.  --Unable to tolerate olanzopine so discontinued --Not taking metoclopramide 10 mg every 8 hours  --continue zofran PRN.   #Supportive Care -- chemotherapy education complete -- port placement not required   No orders of the defined types were placed in this encounter.  All questions were answered. The patient knows to call the clinic with any problems, questions or concerns.  I have spent a total of 30 minutes minutes of face-to-face and non-face-to-face time, preparing to see the patient,  performing a medically appropriate examination, counseling and educating the patient, ordering medications/tests, referring and communicating with other health care professionals, documenting  clinical information in the electronic health record, and care coordination.   Ledell Peoples, MD Department of Hematology/Oncology [place] at [facility] Phone: [phone] Pager: [phone] Email: [email or web address]  08/05/2022 11:05 AM  Dimopoulos MA, Rennie Natter, [place], Mahe B, Herbaux C, Tam C, Orsucci L, [place] ML, Matous JV, [place] C, Kastritis E, Robin [place], Li J, Salman Z, Graef T, Buske C; iNNOVATE Study Group and the [employer] for [employer]. Phase 3 Trial of Ibrutinib plus Rituximab in Waldenstrm's Macroglobulinemia. Alison Stalling J Med. 2018 Jun 21;378(25):2399-2410.   --At 30 months, the progression-free survival rate was 82% with ibrutinib-rituximab versus 28% with placebo-rituximab (hazard ratio for progression or death, 0.20; P<0.001).

## 2022-08-06 ENCOUNTER — Other Ambulatory Visit (HOSPITAL_COMMUNITY): Payer: Self-pay

## 2022-08-07 ENCOUNTER — Other Ambulatory Visit (HOSPITAL_COMMUNITY): Payer: Self-pay

## 2022-08-10 ENCOUNTER — Other Ambulatory Visit: Payer: Self-pay | Admitting: Nurse Practitioner

## 2022-08-10 ENCOUNTER — Other Ambulatory Visit (HOSPITAL_COMMUNITY): Payer: Self-pay

## 2022-08-10 DIAGNOSIS — C880 Waldenstrom macroglobulinemia: Secondary | ICD-10-CM

## 2022-08-10 DIAGNOSIS — G893 Neoplasm related pain (acute) (chronic): Secondary | ICD-10-CM

## 2022-08-10 DIAGNOSIS — Z515 Encounter for palliative care: Secondary | ICD-10-CM

## 2022-08-10 MED ORDER — OXYCODONE HCL 10 MG PO TABS
10.0000 mg | ORAL_TABLET | ORAL | 0 refills | Status: DC | PRN
  Filled 2022-08-11: qty 90, 15d supply, fill #0

## 2022-08-11 ENCOUNTER — Other Ambulatory Visit (HOSPITAL_COMMUNITY): Payer: Self-pay

## 2022-08-19 ENCOUNTER — Inpatient Hospital Stay: Payer: Self-pay | Attending: Hematology and Oncology | Admitting: Nurse Practitioner

## 2022-08-19 ENCOUNTER — Other Ambulatory Visit: Payer: Self-pay | Admitting: Nurse Practitioner

## 2022-08-19 ENCOUNTER — Encounter: Payer: Self-pay | Admitting: Nurse Practitioner

## 2022-08-19 ENCOUNTER — Other Ambulatory Visit (HOSPITAL_COMMUNITY): Payer: Self-pay

## 2022-08-19 DIAGNOSIS — G893 Neoplasm related pain (acute) (chronic): Secondary | ICD-10-CM

## 2022-08-19 DIAGNOSIS — F419 Anxiety disorder, unspecified: Secondary | ICD-10-CM

## 2022-08-19 DIAGNOSIS — C880 Waldenstrom macroglobulinemia: Secondary | ICD-10-CM

## 2022-08-19 DIAGNOSIS — Z515 Encounter for palliative care: Secondary | ICD-10-CM

## 2022-08-19 DIAGNOSIS — T402X5A Adverse effect of other opioids, initial encounter: Secondary | ICD-10-CM | POA: Insufficient documentation

## 2022-08-19 DIAGNOSIS — I5022 Chronic systolic (congestive) heart failure: Secondary | ICD-10-CM | POA: Insufficient documentation

## 2022-08-19 DIAGNOSIS — R630 Anorexia: Secondary | ICD-10-CM

## 2022-08-19 DIAGNOSIS — Z8249 Family history of ischemic heart disease and other diseases of the circulatory system: Secondary | ICD-10-CM | POA: Insufficient documentation

## 2022-08-19 DIAGNOSIS — Z5941 Food insecurity: Secondary | ICD-10-CM | POA: Insufficient documentation

## 2022-08-19 DIAGNOSIS — Z5986 Financial insecurity: Secondary | ICD-10-CM | POA: Insufficient documentation

## 2022-08-19 DIAGNOSIS — I110 Hypertensive heart disease with heart failure: Secondary | ICD-10-CM | POA: Insufficient documentation

## 2022-08-19 DIAGNOSIS — Z87891 Personal history of nicotine dependence: Secondary | ICD-10-CM | POA: Insufficient documentation

## 2022-08-19 DIAGNOSIS — G4700 Insomnia, unspecified: Secondary | ICD-10-CM

## 2022-08-19 DIAGNOSIS — D472 Monoclonal gammopathy: Secondary | ICD-10-CM | POA: Insufficient documentation

## 2022-08-19 DIAGNOSIS — Z79899 Other long term (current) drug therapy: Secondary | ICD-10-CM | POA: Insufficient documentation

## 2022-08-19 DIAGNOSIS — Z885 Allergy status to narcotic agent status: Secondary | ICD-10-CM | POA: Insufficient documentation

## 2022-08-19 DIAGNOSIS — K5903 Drug induced constipation: Secondary | ICD-10-CM | POA: Insufficient documentation

## 2022-08-19 MED ORDER — DIAZEPAM 5 MG PO TABS
5.0000 mg | ORAL_TABLET | Freq: Every evening | ORAL | 0 refills | Status: DC | PRN
  Filled 2022-08-19: qty 30, 30d supply, fill #0

## 2022-08-19 MED ORDER — DIAZEPAM 5 MG PO TABS
5.0000 mg | ORAL_TABLET | Freq: Every evening | ORAL | 0 refills | Status: DC | PRN
  Filled 2022-08-19: qty 15, 15d supply, fill #0

## 2022-08-19 NOTE — Progress Notes (Signed)
[place]  [phone] [phone]) [phone]   Name: Connor Solomon Date: 08/19/2022 MRN: [id number]  DOB: June 30, 1961  Patient Care Team: Delene Ruffini, MD as PCP - General (Internal Medicine) Pickenpack-Cousar, Carlena Sax, NP as Nurse Practitioner (Nurse Practitioner)   I connected with [name] on 08/19/22 at 10:30 AM EST by phone and verified that I am speaking with the correct person using two identifiers.   I discussed the limitations, risks, security and privacy concerns of performing an evaluation and management service by telemedicine and the availability of in-person appointments. I also discussed with the patient that there may be a patient responsible charge related to this service. The patient expressed understanding and agreed to proceed.   Other persons participating in the visit and their role in the encounter: [place], RN    Patient's location: Home   Provider's location: [place]: KELL FERRIS is a 61 y.o. 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 Palliative ask to see for symptom management.  SOCIAL HISTORY:     reports that he quit smoking about 22 months ago. His smoking use included cigars. He has never used smokeless tobacco. He reports that he does not drink alcohol and does not use drugs.  ADVANCE DIRECTIVES:    CODE STATUS:   PAST MEDICAL HISTORY: Past Medical History:  Diagnosis Date   Acute heart failure ([place]) 04/08/2021   Acute HFrEF (heart failure with reduced ejection fraction) ([place]) 04/07/2021   Arthritis    hands, elbows bilaterally   Cancer (HCC)    Full dentures    Hypertension    Pt states he doen not have HTN and has never been treated for HTN   Kidney stone on left side last stone 4-5 yrs ago   recurrent (3 episodes)   Panic disorder    pt states  has resolved   Syncope    resolved- was related to panic attacks    PAST SURGICAL HISTORY:  Past Surgical History:  Procedure Laterality Date   CARPAL METACARPAL FUSION WITH DISTAL RADIAL BONE GRAFT Left 05/30/2013   Procedure: LEFT THUMB METACARPAL JOINT FUSION;  Surgeon: Schuyler Amor, MD;  Location: [place];  Service: Orthopedics;  Laterality: Left;   ESOPHAGOGASTRODUODENOSCOPY N/A 02/12/2014   Procedure: ESOPHAGOGASTRODUODENOSCOPY (EGD);  Surgeon: Jerene Bears, MD;  Location: [facility] ENDOSCOPY;  Service: Endoscopy;  Laterality: N/A;   FOREIGN BODY REMOVAL N/A 02/12/2014   Procedure: FOREIGN BODY REMOVAL;  Surgeon: Jerene Bears, MD;  Location: [place];  Service: Endoscopy;  Laterality: N/A;   OPEN REDUCTION INTERNAL FIXATION (ORIF) DISTAL RADIAL FRACTURE Left 11/29/2012   Procedure: OPEN REDUCTION INTERNAL FIXATION (ORIF) DISTAL RADIAL FRACTURE;  Surgeon: Schuyler Amor, MD;  Location: [place];  Service: Orthopedics;  Laterality: Left;  LEFT DISTAL RADIUS OSTEOTOMY WITH BONE GRAFT   RIGHT/LEFT HEART CATH AND CORONARY ANGIOGRAPHY N/A 04/09/2021   Procedure: RIGHT/LEFT HEART CATH AND CORONARY ANGIOGRAPHY;  Surgeon: Jolaine Artist, MD;  Location: [place] [facility];  Service: Cardiovascular;  Laterality: N/A;   TENDON REPAIR Left 02/07/2013   Procedure: LEFT EXTENSOR INDICIS PROPRIUS  TO EXTENSOR POLLICIS LONGUS TENDON TRANSFER;  Surgeon: Schuyler Amor, MD;  Location: [place];  Service: Orthopedics;  Laterality: Left;   WISDOM TOOTH EXTRACTION     WRIST SURGERY  fx only    HEMATOLOGY/ONCOLOGY HISTORY:  Oncology History  Waldenstrom macroglobulinemia ([place])  05/24/2021 Initial Diagnosis   Waldenstrom macroglobulinemia ([place])   06/05/2021 - 12/30/2021 Chemotherapy   Patient is on Treatment Plan : NON-HODGKINS LYMPHOMA Rituximab q7d       ALLERGIES:  is allergic to rituxan [rituximab] and hydrocodone.  MEDICATIONS:   Current Outpatient Medications  Medication Sig Dispense Refill   acetaminophen (TYLENOL) 500 MG tablet Take 500 mg by mouth every 6 (six) hours as needed.     albuterol (PROVENTIL HFA) 108 (90 Base) MCG/ACT inhaler Inhale 2 puffs into the lungs every 6 hours as needed for wheezing or shortness of breath 6.7 g 2   carvedilol (COREG) 3.125 MG tablet Take 1 tablet by mouth 2 times daily. 60 tablet 6   dapagliflozin propanediol (FARXIGA) 10 MG TABS tablet Take 1 tablet (10 mg total) by mouth daily before breakfast. 30 tablet 11   diazepam (VALIUM) 5 MG tablet Take 1 tablet (5 mg total) by mouth at bedtime as needed for anxiety. 30 tablet 0   dronabinol (MARINOL) 2.5 MG capsule Take 1 capsule by mouth 2 times daily before a meal. 60 capsule 0   eplerenone (INSPRA) 25 MG tablet Take 1/2 tablet (12.5 mg total) by mouth daily. 15 tablet 3   ferrous sulfate 325 (65 FE) MG tablet Take 1 tablet by mouth daily with breakfast. Please take with a source of Vitamin C 90 tablet 3   furosemide (LASIX) 20 MG tablet Take 1 tablet (20 mg total) by mouth daily as needed for swelling 100 tablet 2   ibrutinib (IMBRUVICA) 420 MG tablet Take 1 tablet (420 mg total) by mouth daily. 30 tablet 2   megestrol (MEGACE) 20 MG tablet Take 1 tablet (20 mg total) by mouth daily. 30 tablet 1   methadone (DOLOPHINE) 10 MG tablet Take 1 tablet by mouth every 8 hours. 90 tablet 0   metoCLOPramide (REGLAN) 10 MG tablet Take 1 tablet (10 mg total) by mouth every 8 (eight) hours as needed for nausea. 30 tablet 1   naloxone (NARCAN) nasal spray 4 mg/0.1 mL Take for overdose 1 each 0   naloxone (NARCAN) nasal spray 4 mg/0.1 mL Take for overdose as directed 2 each 0   ondansetron (ZOFRAN) 8 MG tablet Take 1 tablet (8 mg total) by mouth every 8 (eight) hours as needed. 30 tablet 0   Oxycodone HCl 10 MG TABS Take 1 tablet (10 mg total) by mouth every 4 (four) hours as needed. 90 tablet 0   potassium chloride SA (KLOR-CON M) 20 MEQ tablet  Take 1 tablet (20 mEq total) by mouth daily. 30 tablet 1   prochlorperazine (COMPAZINE) 10 MG tablet Take 1 tablet by mouth every 6 hours as needed for nausea or vomiting. 60 tablet 1   sacubitril-valsartan (ENTRESTO) 24-26 MG Take 1 tablet by mouth 2 (two) times daily. 180 tablet 3   No current facility-administered medications for this visit.    VITAL SIGNS: There were no vitals taken for this visit. There were no vitals filed for this visit.    Estimated body mass index is 19.36 kg/m as calculated from the following:   Height as of 07/06/22: $RemoveBef'5\' 8"'[id number]$  (1.727 m).   Weight as of 08/05/22: 127 lb 4.8 oz (57.7 kg).   PERFORMANCE STATUS (ECOG) : 1 - Symptomatic but completely ambulatory   IMPRESSION:  I spoke with Mr. Coykendall by phone for close symptom management follow-up. No acute distress identified.  Is doing as well as to be expected. Outside enjoying the warm weather.    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 He is tolerating Methadone 10mg  three times daily. Denies drowsiness. Oxycodone 10mg  as needed for breakthrough pain.   We will plan to continue close monitoring and adjust as needed.   Constipation Reports daily or every other day bowel movements. Education provided on opioid induced constipation. Recommended Miralax daily. If no bowel movement in 48 hours he knows to increase to twice daily.   Appetite/decreased appetite.  Tolerating marinol for his appetite.   Is trying to drink at least 1-2 ensure daily for additional support.   Weight  is stable at 127lbs.    Education provided on eating small frequent meals versus focusing on 3 large meals per day. Protein enriched foots. Eating when he has a desire. Denies nausea/vomiting. ll continue to closely monitor.   4. Anxiety/Anxiousness  Mr. Czaja is tolerating valium. States his anxiety is much improved and he is also sleeping much better at night. Will continue to take as needed with close monitoring.   5.  Goals of care (5/16) [name] shares his realistic understanding of his condition. He and his wife are making sure all "affairs" are in order as his biggest worry is leaving his wife in a financial constraint. Reports communicating with his life insurance policy etc. Emotional support provided.   He is emotional expressing racing thoughts during idol time or interruptions in his sleep as his mind wonders thinking about worst case scenario. Is requesting something to assist with sleeping and anxiety. Acknowledged his request. Education provided on limited use in collaboration with other medications including zyprexa. He verbalized understanding and aware  we will continue to support and monitor for needs.    PLAN: methadone 10 mg every 8 hours. Close monitoring. Tolerating well. Pain controlled. Oxycodone 10 mg as needed for breakthrough pain.  Miralax daily for constipation Valium 5mg  at bedtime. Zofran as needed for nausea as prescribed.  Marinol for appetite  I will plan to see patient back in 4-6 weeks in collaboration to other oncology appointments.    Patient expressed understanding and was in agreement with this plan. He also understands that He can call the clinic at any time with any questions, concerns, or complaints.    Any controlled substances utilized were prescribed in the context of palliative care. PDMP has been reviewed.    Time Total: 25 min   Visit consisted of counseling and education dealing with the complex and emotionally intense issues of symptom management and palliative care in the setting of serious and potentially life-threatening illness.Greater than 50%  of this time was spent counseling and coordinating care related to the above assessment and plan.  Alda Lea, AGPCNP-BC  Palliative Medicine Team/ [place]

## 2022-08-25 ENCOUNTER — Telehealth: Payer: Self-pay

## 2022-08-25 ENCOUNTER — Other Ambulatory Visit: Payer: Self-pay | Admitting: Nurse Practitioner

## 2022-08-25 ENCOUNTER — Other Ambulatory Visit (HOSPITAL_COMMUNITY): Payer: Self-pay

## 2022-08-25 DIAGNOSIS — Z515 Encounter for palliative care: Secondary | ICD-10-CM

## 2022-08-25 DIAGNOSIS — C880 Waldenstrom macroglobulinemia: Secondary | ICD-10-CM

## 2022-08-25 DIAGNOSIS — G893 Neoplasm related pain (acute) (chronic): Secondary | ICD-10-CM

## 2022-08-25 MED ORDER — OXYCODONE HCL 10 MG PO TABS
10.0000 mg | ORAL_TABLET | ORAL | 0 refills | Status: DC | PRN
  Filled 2022-08-25: qty 90, 15d supply, fill #0

## 2022-08-25 NOTE — Telephone Encounter (Signed)
Pt wife called and LVM for pain med refill, see new orders. Attempted to call pt and wife back, no answer, LVM

## 2022-08-31 ENCOUNTER — Other Ambulatory Visit: Payer: Self-pay | Admitting: Nurse Practitioner

## 2022-08-31 ENCOUNTER — Other Ambulatory Visit: Payer: Self-pay | Admitting: Hematology and Oncology

## 2022-08-31 ENCOUNTER — Other Ambulatory Visit (HOSPITAL_COMMUNITY): Payer: Self-pay

## 2022-08-31 DIAGNOSIS — C880 Waldenstrom macroglobulinemia: Secondary | ICD-10-CM

## 2022-08-31 DIAGNOSIS — G893 Neoplasm related pain (acute) (chronic): Secondary | ICD-10-CM

## 2022-08-31 DIAGNOSIS — Z515 Encounter for palliative care: Secondary | ICD-10-CM

## 2022-08-31 DIAGNOSIS — R630 Anorexia: Secondary | ICD-10-CM

## 2022-08-31 DIAGNOSIS — R634 Abnormal weight loss: Secondary | ICD-10-CM

## 2022-08-31 MED ORDER — MEGESTROL ACETATE 20 MG PO TABS
20.0000 mg | ORAL_TABLET | Freq: Every day | ORAL | 1 refills | Status: DC
  Filled 2022-08-31: qty 30, 30d supply, fill #0

## 2022-08-31 MED ORDER — DRONABINOL 2.5 MG PO CAPS
2.5000 mg | ORAL_CAPSULE | Freq: Two times a day (BID) | ORAL | 0 refills | Status: DC
  Filled 2022-08-31: qty 60, 30d supply, fill #0

## 2022-08-31 MED ORDER — METHADONE HCL 10 MG PO TABS
10.0000 mg | ORAL_TABLET | Freq: Three times a day (TID) | ORAL | 0 refills | Status: DC
  Filled 2022-09-03 – 2022-09-04 (×2): qty 90, 30d supply, fill #0

## 2022-09-01 ENCOUNTER — Encounter: Payer: Self-pay | Admitting: Nurse Practitioner

## 2022-09-01 ENCOUNTER — Inpatient Hospital Stay (HOSPITAL_BASED_OUTPATIENT_CLINIC_OR_DEPARTMENT_OTHER): Payer: Self-pay | Admitting: Hematology and Oncology

## 2022-09-01 ENCOUNTER — Inpatient Hospital Stay (HOSPITAL_BASED_OUTPATIENT_CLINIC_OR_DEPARTMENT_OTHER): Payer: Self-pay | Admitting: Nurse Practitioner

## 2022-09-01 ENCOUNTER — Other Ambulatory Visit (HOSPITAL_COMMUNITY): Payer: Self-pay

## 2022-09-01 ENCOUNTER — Inpatient Hospital Stay: Payer: Self-pay

## 2022-09-01 ENCOUNTER — Other Ambulatory Visit: Payer: Self-pay

## 2022-09-01 VITALS — BP 137/92 | HR 77 | Temp 97.8°F | Resp 18 | Ht 68.0 in | Wt 129.5 lb

## 2022-09-01 DIAGNOSIS — C880 Waldenstrom macroglobulinemia: Secondary | ICD-10-CM

## 2022-09-01 DIAGNOSIS — G893 Neoplasm related pain (acute) (chronic): Secondary | ICD-10-CM

## 2022-09-01 DIAGNOSIS — Z515 Encounter for palliative care: Secondary | ICD-10-CM

## 2022-09-01 DIAGNOSIS — R530 Neoplastic (malignant) related fatigue: Secondary | ICD-10-CM

## 2022-09-01 DIAGNOSIS — R110 Nausea: Secondary | ICD-10-CM

## 2022-09-01 DIAGNOSIS — K5903 Drug induced constipation: Secondary | ICD-10-CM

## 2022-09-01 LAB — CMP (CANCER CENTER ONLY)
ALT: 14 U/L (ref 0–44)
AST: 18 U/L (ref 15–41)
Albumin: 4 g/dL (ref 3.5–5.0)
Alkaline Phosphatase: 56 U/L (ref 38–126)
Anion gap: 8 (ref 5–15)
BUN: 13 mg/dL (ref 8–23)
CO2: 25 mmol/L (ref 22–32)
Calcium: 9 mg/dL (ref 8.9–10.3)
Chloride: 106 mmol/L (ref 98–111)
Creatinine: 1.01 mg/dL (ref 0.61–1.24)
GFR, Estimated: >60 mL/min
Glucose, Bld: 106 mg/dL — ABNORMAL HIGH (ref 70–99)
Potassium: 4 mmol/L (ref 3.5–5.1)
Sodium: 139 mmol/L (ref 135–145)
Total Bilirubin: 0.8 mg/dL (ref 0.3–1.2)
Total Protein: 6.5 g/dL (ref 6.5–8.1)

## 2022-09-01 LAB — CBC WITH DIFFERENTIAL (CANCER CENTER ONLY)
Abs Immature Granulocytes: 0.01 10*3/uL (ref 0.00–0.07)
Basophils Absolute: 0 10*3/uL (ref 0.0–0.1)
Basophils Relative: 1 %
Eosinophils Absolute: 0.2 10*3/uL (ref 0.0–0.5)
Eosinophils Relative: 4 %
HCT: 41.5 % (ref 39.0–52.0)
Hemoglobin: 13.6 g/dL (ref 13.0–17.0)
Immature Granulocytes: 0 %
Lymphocytes Relative: 26 %
Lymphs Abs: 1.7 10*3/uL (ref 0.7–4.0)
MCH: 28.6 pg (ref 26.0–34.0)
MCHC: 32.8 g/dL (ref 30.0–36.0)
MCV: 87.4 fL (ref 80.0–100.0)
Monocytes Absolute: 0.5 10*3/uL (ref 0.1–1.0)
Monocytes Relative: 8 %
Neutro Abs: 4 10*3/uL (ref 1.7–7.7)
Neutrophils Relative %: 61 %
Platelet Count: 285 10*3/uL (ref 150–400)
RBC: 4.75 MIL/uL (ref 4.22–5.81)
RDW: 15 % (ref 11.5–15.5)
WBC Count: 6.5 10*3/uL (ref 4.0–10.5)
nRBC: 0 % (ref 0.0–0.2)

## 2022-09-01 LAB — LACTATE DEHYDROGENASE: LDH: 129 U/L (ref 98–192)

## 2022-09-01 MED ORDER — ONDANSETRON HCL 8 MG PO TABS
8.0000 mg | ORAL_TABLET | Freq: Three times a day (TID) | ORAL | 0 refills | Status: DC | PRN
  Filled 2022-09-01: qty 18, 21d supply, fill #0
  Filled 2022-12-29: qty 12, 18d supply, fill #1

## 2022-09-01 NOTE — Progress Notes (Signed)
Connor Solomon  [phone]) [phone] Fax:(336) [phone]   Name: Connor Solomon Date: 09/01/2022 MRN: [id number]  DOB: 10-21-1960  Patient Care Team: Delene Ruffini, MD as PCP - General (Internal Medicine) Pickenpack-Cousar, Carlena Sax, NP as Nurse Practitioner (Nurse Practitioner)   REASON FOR CONSULTATION: Connor Solomon is a 61 y.o. 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 Palliative ask to see for symptom management.  SOCIAL HISTORY:     reports that he quit smoking about 22 months ago. His smoking use included cigars. He has never used smokeless tobacco. He reports that he does not drink alcohol and does not use drugs.  ADVANCE DIRECTIVES:    CODE STATUS:   PAST MEDICAL HISTORY: Past Medical History:  Diagnosis Date   Acute heart failure ([place]) 04/08/2021   Acute HFrEF (heart failure with reduced ejection fraction) ([place]) 04/07/2021   Arthritis    hands, elbows bilaterally   Cancer (HCC)    Full dentures    Hypertension    Pt states he doen not have HTN and has never been treated for HTN   Kidney stone on left side last stone 4-5 yrs ago   recurrent (3 episodes)   Panic disorder    pt states has resolved   Syncope    resolved- was related to panic attacks    PAST SURGICAL HISTORY:  Past Surgical History:  Procedure Laterality Date   CARPAL METACARPAL FUSION WITH DISTAL RADIAL BONE GRAFT Left 05/30/2013   Procedure: LEFT THUMB METACARPAL JOINT FUSION;  Surgeon: Schuyler Amor, MD;  Location: [place];  Service: Orthopedics;  Laterality: Left;   ESOPHAGOGASTRODUODENOSCOPY N/A 02/12/2014   Procedure: ESOPHAGOGASTRODUODENOSCOPY (EGD);  Surgeon: Jerene Bears, MD;  Location: [facility] ENDOSCOPY;  Service: Endoscopy;  Laterality: N/A;   FOREIGN BODY REMOVAL N/A 02/12/2014   Procedure: FOREIGN BODY REMOVAL;  Surgeon: Jerene Bears, MD;  Location: [place];  Service: Endoscopy;  Laterality: N/A;   OPEN REDUCTION INTERNAL FIXATION (ORIF) DISTAL RADIAL FRACTURE Left 11/29/2012   Procedure: OPEN REDUCTION INTERNAL FIXATION (ORIF) DISTAL RADIAL FRACTURE;  Surgeon: Schuyler Amor, MD;  Location: [place];  Service: Orthopedics;  Laterality: Left;  LEFT DISTAL RADIUS OSTEOTOMY WITH BONE GRAFT   RIGHT/LEFT HEART CATH AND CORONARY ANGIOGRAPHY N/A 04/09/2021   Procedure: RIGHT/LEFT HEART CATH AND CORONARY ANGIOGRAPHY;  Surgeon: Jolaine Artist, MD;  Location: [place] [facility];  Service: Cardiovascular;  Laterality: N/A;   TENDON REPAIR Left 02/07/2013   Procedure: LEFT EXTENSOR INDICIS PROPRIUS  TO EXTENSOR POLLICIS LONGUS TENDON TRANSFER;  Surgeon: Schuyler Amor, MD;  Location: [place];  Service: Orthopedics;  Laterality: Left;   WISDOM TOOTH EXTRACTION     WRIST SURGERY     fx only    HEMATOLOGY/ONCOLOGY HISTORY:  Oncology History  Waldenstrom macroglobulinemia ([place])  05/24/2021 Initial Diagnosis   Waldenstrom macroglobulinemia ([place])   06/05/2021 - 12/30/2021 Chemotherapy   Patient is on Treatment Plan : NON-HODGKINS LYMPHOMA Rituximab q7d       ALLERGIES:  is allergic to rituxan [rituximab] and hydrocodone.  MEDICATIONS:  Current Outpatient Medications  Medication Sig Dispense Refill   acetaminophen (TYLENOL) 500 MG tablet Take 500 mg by mouth every 6 (six) hours as needed.     albuterol (PROVENTIL HFA) 108 (90 Base) MCG/ACT inhaler Inhale 2 puffs into the lungs every 6 hours as needed for wheezing  or shortness of breath 6.7 g 2   carvedilol (COREG) 3.125 MG tablet Take 1 tablet by mouth 2 times daily. 60 tablet 6   dapagliflozin propanediol (FARXIGA) 10 MG TABS tablet Take 1 tablet (10 mg total) by mouth daily before breakfast. 30 tablet 11   diazepam (VALIUM) 5 MG tablet Take 1 tablet (5 mg total) by mouth at bedtime as needed for anxiety. 30 tablet 0    dronabinol (MARINOL) 2.5 MG capsule Take 1 capsule by mouth 2 times daily before a meal. 60 capsule 0   eplerenone (INSPRA) 25 MG tablet Take 1/2 tablet (12.5 mg total) by mouth daily. 15 tablet 3   ferrous sulfate 325 (65 FE) MG tablet Take 1 tablet by mouth daily with breakfast. Please take with a source of Vitamin C 90 tablet 3   furosemide (LASIX) 20 MG tablet Take 1 tablet (20 mg total) by mouth daily as needed for swelling 100 tablet 2   ibrutinib (IMBRUVICA) 420 MG tablet Take 1 tablet (420 mg total) by mouth daily. 30 tablet 2   [START ON 09/03/2022] methadone (DOLOPHINE) 10 MG tablet Take 1 tablet by mouth every 8 hours. (09/03/22) 90 tablet 0   metoCLOPramide (REGLAN) 10 MG tablet Take 1 tablet (10 mg total) by mouth every 8 (eight) hours as needed for nausea. 30 tablet 1   naloxone (NARCAN) nasal spray 4 mg/0.1 mL Take for overdose 1 each 0   naloxone (NARCAN) nasal spray 4 mg/0.1 mL Take for overdose as directed 2 each 0   ondansetron (ZOFRAN) 8 MG tablet Take 1 tablet (8 mg total) by mouth every 8 (eight) hours as needed. 30 tablet 0   Oxycodone HCl 10 MG TABS Take 1 tablet (10 mg total) by mouth every 4 (four) hours as needed. 90 tablet 0   potassium chloride SA (KLOR-CON M) 20 MEQ tablet Take 1 tablet (20 mEq total) by mouth daily. 30 tablet 1   sacubitril-valsartan (ENTRESTO) 24-26 MG Take 1 tablet by mouth 2 (two) times daily. 180 tablet 3   No current facility-administered medications for this visit.    VITAL SIGNS: BP (!) 137/92 (BP Location: Left Arm, Patient Position: Sitting)   Pulse 77   Temp 97.8 F (36.6 C)   Resp 18   Ht 5\' 8"  (1.727 m)   Wt 129 lb 8 oz (58.7 kg)   SpO2 98%   BMI 19.69 kg/m  Filed Weights   09/01/22 1017  Weight: 129 lb 8 oz (58.7 kg)      Estimated body mass index is 19.69 kg/m as calculated from the following:   Height as of this encounter: 5\' 8"  (1.727 m).   Weight as of this encounter: 129 lb 8 oz (58.7 kg).   PERFORMANCE STATUS  (ECOG) : 1 - Symptomatic but completely ambulatory   IMPRESSION:  [name] presented to clinic today for symptom management follow-up. No acute distress identified. Is remaining active. Does endorse some occasional nausea which is controlled with medications. Is looking forward to spending time with family for Thanksgiving.   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 He is tolerating Methadone 10mg  three times daily. Denies drowsiness. Oxycodone 10mg  as needed for breakthrough pain. Taking as prescribed.  We will plan to continue close monitoring and adjust as needed.   Constipation Improved with regimen and diet changes. Education provided on opioid induced constipation. Recommended Miralax daily. If no bowel movement in 48 hours he knows to increase to twice daily.  Appetite/decreased appetite.  Tolerating marinol for his appetite.   Is trying to drink at least 1-2 ensure daily for additional support.   Weight has increased to 129 pounds up from 127 pounds on 10/26.  Education provided on eating small frequent meals versus focusing on 3 large meals per day. Protein enriched foots. Eating when he has a desire. Denies nausea/vomiting. ll continue to closely monitor.   4. Anxiety/Anxiousness  Mr. Yearwood is tolerating valium. States his anxiety is much improved and he is also sleeping much better at night. Will continue to take as needed with close monitoring.   5. Goals of care (5/16) Mr. Delmonaco shares his realistic understanding of his condition. He and his wife are making sure all "affairs" are in order as his biggest worry is leaving his wife in a financial constraint. Reports communicating with his life insurance policy etc. Emotional support provided.   He is emotional expressing racing thoughts during idol time or interruptions in his sleep as his mind wonders thinking about worst case scenario. Is requesting something to assist with sleeping and anxiety. Acknowledged his  request. Education provided on limited use in collaboration with other medications including zyprexa. He verbalized understanding and aware  we will continue to support and monitor for needs.    PLAN: methadone 10 mg every 8 hours. Close monitoring. Tolerating well. Pain controlled. Oxycodone 10 mg as needed for breakthrough pain.  Miralax daily for constipation Valium 5mg  at bedtime. Zofran as needed for nausea as prescribed.  Marinol for appetite  I will plan to see patient back in 4-6 weeks in collaboration to other oncology appointments.    Patient expressed understanding and was in agreement with this plan. He also understands that He can call the clinic at any time with any questions, concerns, or complaints.    Any controlled substances utilized were prescribed in the context of palliative care. PDMP has been reviewed.    Time Total: 35 min   Visit consisted of counseling and education dealing with the complex and emotionally intense issues of symptom management and palliative care in the setting of serious and potentially life-threatening illness.Greater than 50%  of this time was spent counseling and coordinating care related to the above assessment and plan.  Alda Lea, AGPCNP-BC  Palliative Medicine Team/[place]

## 2022-09-01 NOTE — Progress Notes (Signed)
[place] [phone]   [phone]  PROGRESS NOTE  Patient Care Team: Delene Ruffini, MD as PCP - General (Internal Medicine) Pickenpack-Cousar, Carlena Sax, NP as Nurse Practitioner (Nurse Practitioner)  Hematological/Oncological History # Waldenstrom's Macroglobulinemia # IgM Monoclonal Gammopathy 04/07/2021: CT A/p showed mildly enlarged retroperitoneal lymph nodes and bilateral inguinal lymph nodes.  04/08/2021: IgM Kappa M protein 1.2 04/09/2021: WBC 11.1, Hgb 9.5, MCV 75.4, Plt 525 04/20/2021: Kappa 977, Lambda 7.8, K/L ratio 125.28. Serum viscosity 1.8 05/06/2021: Establish care with Dr. 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 Infusion reaction with ritux, held halfway through.  06/12/2021: Cycle 2 Day 1 of Ibrutinib + Rituximab 06/19/2021: Cycle 3 Day 1 of Ibrutinib + Rituximab 06/26/2021: Cycle 4 Day 1 of rituximab. Continued on daily ibrutinib 12/02/2021: Cycle 5 Day 1 of Ibrutinib + Rituximab 12/09/2021: Cycle 6 Day 1 of Ibrutinib + Rituximab 12/16/2021: Cycle 7 Day 1 of Ibrutinib + Rituximab 12/23/2021: Held Cycle 8 of Ritxumab due to nausea/vomiting, elevated creatinine, finger infection  Interval History:  Connor Solomon 61 y.o. male with medical history significant for Waldenstrom's macroglobulinemia who presents for a follow up visit. The patient's last visit was on 08/05/2022. In the interim since the last visit he has continued on Ibrutinib therapy.   On exam today [name] reports he has been feeling okay overall interim since her last visit.  He is currently getting over a head cold.  He continues to have dryness and cracking of his fingers bilaterally.  Overall he feels like his rash on his hands is improving.  He notes his energy level is "all right".   He has been doing his best to eat well.  His pain is currently under control, currently a 3 out of 10.  He has been using a lotion in order to help try to help with the dry cracking hands.  He does have nausea and vomiting every few days but that his antinausea pills do work to help keep the symptoms under control.  He has not been having any issues with bleeding or bruising.  Additionally he is not having any palpitations.  He does continue to have some nasal drainage but overall his cold symptoms have improved.  He denies fevers, chills, night sweats, shortness of breath, chest pain or cough. He has no other complaints. Full 10 point ROS is listed below.   MEDICAL HISTORY:  Past Medical History:  Diagnosis Date   Acute heart failure ([place]) 04/08/2021   Acute HFrEF (heart failure with reduced ejection fraction) ([place]) 04/07/2021   Arthritis    hands, elbows bilaterally   Cancer ([facility])    Full dentures    Hypertension    Pt states he doen not have HTN and has never been treated for HTN   Kidney stone on left side last stone 4-5 yrs ago   recurrent (3 episodes)   Panic disorder    pt states has resolved   Syncope    resolved- was related to panic attacks    SURGICAL HISTORY: Past Surgical History:  Procedure Laterality Date   CARPAL METACARPAL FUSION WITH DISTAL RADIAL BONE GRAFT Left 05/30/2013   Procedure: LEFT THUMB METACARPAL JOINT FUSION;  Surgeon: Schuyler Amor, MD;  Location: [place];  Service: Orthopedics;  Laterality: Left;   ESOPHAGOGASTRODUODENOSCOPY N/A 02/12/2014   Procedure: ESOPHAGOGASTRODUODENOSCOPY (  EGD);  Surgeon: Jerene Bears, MD;  Location: [facility] ENDOSCOPY;  Service: Endoscopy;  Laterality: N/A;   FOREIGN BODY REMOVAL N/A 02/12/2014   Procedure: FOREIGN BODY REMOVAL;  Surgeon: Jerene Bears, MD;  Location: [place];  Service: Endoscopy;  Laterality: N/A;   OPEN REDUCTION INTERNAL FIXATION (ORIF) DISTAL RADIAL FRACTURE Left 11/29/2012   Procedure: OPEN  REDUCTION INTERNAL FIXATION (ORIF) DISTAL RADIAL FRACTURE;  Surgeon: Schuyler Amor, MD;  Location: [place];  Service: Orthopedics;  Laterality: Left;  LEFT DISTAL RADIUS OSTEOTOMY WITH BONE GRAFT   RIGHT/LEFT HEART CATH AND CORONARY ANGIOGRAPHY N/A 04/09/2021   Procedure: RIGHT/LEFT HEART CATH AND CORONARY ANGIOGRAPHY;  Surgeon: Jolaine Artist, MD;  Location: [place] [facility];  Service: Cardiovascular;  Laterality: N/A;   TENDON REPAIR Left 02/07/2013   Procedure: LEFT EXTENSOR INDICIS PROPRIUS  TO EXTENSOR POLLICIS LONGUS TENDON TRANSFER;  Surgeon: Schuyler Amor, MD;  Location: [place];  Service: Orthopedics;  Laterality: Left;   WISDOM TOOTH EXTRACTION     WRIST SURGERY     fx only    SOCIAL HISTORY: Social History   Socioeconomic History   Marital status: Single    Spouse name: Not on file   Number of children: Not on file   Years of education: Not on file   Highest education level: Not on file  Occupational History   Not on file  Tobacco Use   Smoking status: Former    Types: Cigars    Quit date: 10/11/2020    Years since quitting: 1.8   Smokeless tobacco: Never   Tobacco comments:    smokes 1 cigar a week  Vaping Use   Vaping Use: Never used  Substance and Sexual Activity   Alcohol use: No   Drug use: No   Sexual activity: Not on file  Other Topics Concern   Not on file  Social History Narrative   Not on file   Social Determinants of Health   Financial Resource Strain: High Risk (07/17/2021)   Overall Financial Resource Strain (CARDIA)    Difficulty of Paying Living Expenses: Very hard  Food Insecurity: Food Insecurity Present (04/10/2021)   Hunger Vital Sign    Worried About [place] in the Last Year: Sometimes true    Ran Out of Food in the Last Year: Sometimes true  Transportation Needs: No Transportation Needs (04/10/2021)   PRAPARE - Hydrologist (Medical): No    Lack of  Transportation (Non-Medical): No  Physical Activity: Not on file  Stress: Stress Concern Present (09/01/2021)   [place]    Feeling of Stress : Very much  Social Connections: Not on file  Intimate Partner Violence: Not on file    FAMILY HISTORY: Family History  Problem Relation Age of Onset   Heart attack Father    Sudden Cardiac Death Father 55   Diabetes Neg Hx    Hyperlipidemia Neg Hx    Hypertension Neg Hx     ALLERGIES:  is allergic to rituxan [rituximab] and hydrocodone.  MEDICATIONS:  Current Outpatient Medications  Medication Sig Dispense Refill   acetaminophen (TYLENOL) 500 MG tablet Take 500 mg by mouth every 6 (six) hours as needed.     albuterol (PROVENTIL HFA) 108 (90 Base) MCG/ACT inhaler Inhale 2 puffs into the lungs every 6 hours as needed for wheezing or shortness of breath 6.7 g 2  carvedilol (COREG) 3.125 MG tablet Take 1 tablet by mouth 2 times daily. 60 tablet 6   dapagliflozin propanediol (FARXIGA) 10 MG TABS tablet Take 1 tablet (10 mg total) by mouth daily before breakfast. 30 tablet 11   diazepam (VALIUM) 5 MG tablet Take 1 tablet (5 mg total) by mouth at bedtime as needed for anxiety. 30 tablet 0   dronabinol (MARINOL) 2.5 MG capsule Take 1 capsule by mouth 2 times daily before a meal. 60 capsule 0   eplerenone (INSPRA) 25 MG tablet Take 1/2 tablet (12.5 mg total) by mouth daily. 15 tablet 3   ferrous sulfate 325 (65 FE) MG tablet Take 1 tablet by mouth daily with breakfast. Please take with a source of Vitamin C 90 tablet 3   furosemide (LASIX) 20 MG tablet Take 1 tablet (20 mg total) by mouth daily as needed for swelling 100 tablet 2   ibrutinib (IMBRUVICA) 420 MG tablet Take 1 tablet (420 mg total) by mouth daily. 30 tablet 2   [START ON 09/03/2022] methadone (DOLOPHINE) 10 MG tablet Take 1 tablet by mouth every 8 hours. (09/03/22) 90 tablet 0   metoCLOPramide (REGLAN) 10 MG tablet  Take 1 tablet (10 mg total) by mouth every 8 (eight) hours as needed for nausea. 30 tablet 1   naloxone (NARCAN) nasal spray 4 mg/0.1 mL Take for overdose 1 each 0   naloxone (NARCAN) nasal spray 4 mg/0.1 mL Take for overdose as directed 2 each 0   ondansetron (ZOFRAN) 8 MG tablet Take 1 tablet (8 mg total) by mouth every 8 (eight) hours as needed. 30 tablet 0   Oxycodone HCl 10 MG TABS Take 1 tablet (10 mg total) by mouth every 4 (four) hours as needed. 90 tablet 0   potassium chloride SA (KLOR-CON M) 20 MEQ tablet Take 1 tablet (20 mEq total) by mouth daily. 30 tablet 1   sacubitril-valsartan (ENTRESTO) 24-26 MG Take 1 tablet by mouth 2 (two) times daily. 180 tablet 3   No current facility-administered medications for this visit.    REVIEW OF SYSTEMS:   Constitutional: ( - ) fevers, ( - )  chills , ( - ) night sweats Eyes: ( - ) blurriness of vision, ( - ) double vision, ( - ) watery eyes Ears, nose, mouth, throat, and face: ( - ) mucositis, ( - ) sore throat Respiratory: ( - ) cough, ( - ) dyspnea, ( - ) wheezes Cardiovascular: ( - ) palpitation, ( - ) chest discomfort, ( - ) lower extremity swelling Gastrointestinal:  ( + ) nausea, ( - ) heartburn, ( - ) change in bowel habits Skin: ( - ) abnormal skin rashes Lymphatics: ( - ) new lymphadenopathy, ( - ) easy bruising Neurological: ( - ) numbness, ( - ) tingling, ( - ) new weaknesses Behavioral/Psych: ( - ) mood change, ( - ) new changes  All other systems were reviewed with the patient and are negative.  PHYSICAL EXAMINATION: ECOG PERFORMANCE STATUS: 1 - Symptomatic but completely ambulatory  There were no vitals filed for this visit.     There were no vitals filed for this visit.    GENERAL: Chronically ill appearing middle-aged Caucasian male, alert, no distress and comfortable SKIN: skin color, texture, turgor are normal, no rashes or significant lesions.  EYES: conjunctiva are pink and non-injected, sclera clear LUNGS:  clear to auscultation and percussion with normal breathing effort HEART: regular rate & rhythm and no murmurs and no lower extremity  edema  PSYCH: alert & oriented x 3, fluent speech NEURO: no focal motor/sensory deficits   LABORATORY DATA:  I have reviewed the data as listed    Latest Ref Rng & Units 09/01/2022   10:02 AM 08/05/2022   10:36 AM 07/23/2022   11:23 AM  CBC  WBC 4.0 - 10.5 K/uL 6.5  6.7  8.1   Hemoglobin 13.0 - 17.0 g/dL 13.6  12.4  11.3   Hematocrit 39.0 - 52.0 % 41.5  37.6  33.6   Platelets 150 - 400 K/uL 285  267  415        Latest Ref Rng & Units 09/01/2022   10:02 AM 08/05/2022   10:36 AM 07/23/2022   11:23 AM  CMP  Glucose 70 - 99 mg/dL 106  106  83   BUN 8 - 23 mg/dL _0 Creatinine 0.61 - 1.24 mg/dL 1.01  0.78  0.73   Sodium 135 - 145 mmol/L 139  134  134   Potassium 3.5 - 5.1 mmol/L 4.0  4.3  4.0   Chloride 98 - 111 mmol/L 106  107  105   CO2 22 - 32 mmol/L _1 Calcium 8.9 - 10.3 mg/dL 9.0  8.4  8.8   Total Protein 6.5 - 8.1 g/dL 6.5  5.3  6.1   Total Bilirubin 0.3 - 1.2 mg/dL 0.8  0.5  0.4   Alkaline Phos 38 - 126 U/L 56  51  84   AST 15 - 41 U/L _2 ALT 0 - 44 U/L _3 Lab Results  Component Value Date   MPROTEIN 0.1 (H) 07/06/2022   MPROTEIN 0.1 (H) 06/10/2022   MPROTEIN 0.1 (H) 05/04/2022   Lab Results  Component Value Date   KPAFRELGTCHN 26.2 (H) 07/06/2022   KPAFRELGTCHN 21.4 (H) 06/10/2022   KPAFRELGTCHN 26.3 (H) 05/04/2022   LAMBDASER 4.3 (L) 07/06/2022   LAMBDASER 4.1 (L) 06/10/2022   LAMBDASER 1.6 (L) 05/04/2022   KAPLAMBRATIO 6.09 (H) 07/06/2022   KAPLAMBRATIO 5.22 (H) 06/10/2022   KAPLAMBRATIO 16.44 (H) 05/04/2022    RADIOGRAPHIC STUDIES: No results found.  ASSESSMENT & PLAN MASEN LUALLEN 61 y.o. male with medical history significant for Waldenstrom's macroglobulinemia who presents for a follow up visit.   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  There is no clear evidence of hyperviscosity syndrome at this time.  At this time we will plan to proceed with ibrutinib and rituximab therapy.  One could also consider Bendamustine and rituximab therapy but given the patient's degree of anemia I would prefer pursuing ibrutinib and rituximab at this time.  Additionally ibrutinib/rituximab are category 1 recommendations per the NCCN guidelines.  The treatment will consist of ibrutinib 420 mg p.o. daily with rituximab on weeks 1 through 4 as well as weeks 27 through 30.  Previously we discussed the risks and benefits of therapy including (but not limited to) risk of bleeding, fatigue, atypical infections, and cardiac arrhythmias.  The patient voices understanding of this plan moving forward.   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  This is confirmed with an IgM monoclonal gammopathy and bone marrow biopsy results showing a lymphoplasmacytic lymphoma --No clear signs  of hyperviscosity syndrome at this time. --Started second round of weekly rituximab on 12/02/2021. Continued weekly x 4 weeks.   --last rituximab dose held due to missed visits/illness.  Plan: --Labs today were reviewed without any intervention needed.  Labs show white cell count 6.5, hemoglobin 13.6, MCV 87.4, and platelets 285.  Pending SPEP and sFLC levels but prior levels appear stable. --continues on ibrutinib 420 mg PO daily.  --RTC in 4 weeks with labs   # Hypokalemia-improving -- Potassium 4.0, continue to monitor   #Pain Management --Pain was poorly controlled on hydrocodone therapy previously.  --Patient was previously admitted for for opioid overdose with presumed fentanyl --Currently pain regimen includes methadone 10 mg TID per  palliative care.  -- Appreciate recommendations of palliative care.  #Normocytic Anemia  --Hgb 13.6, WBC 6.5 --previously there was a component of iron deficiency anemia as well.  Currently on PO ferrous sulfate to try and bolster his iron levels.  --Continue to monitor.  #Nausea and vomiting--improving -- Concern that the nausea and vomiting may not be related to the ibrutinib therapy and potentially tied to poor gastric motility due to opioid prescription.  --Unable to tolerate olanzopine so discontinued --Not taking metoclopramide 10 mg every 8 hours  --continue zofran PRN.   #Supportive Care -- chemotherapy education complete -- port placement not required   No orders of the defined types were placed in this encounter.  All questions were answered. The patient knows to call the clinic with any problems, questions or concerns.  I have spent a total of 30 minutes minutes of face-to-face and non-face-to-face time, preparing to see the patient,  performing a medically appropriate examination, counseling and educating the patient, ordering medications/tests, referring and communicating with other health care professionals, documenting clinical information in the electronic health record, and care coordination.   Ledell Peoples, MD Department of Hematology/Oncology [place] at [facility] Phone: [phone] Pager: [phone] Email: [email or web address]  09/01/2022 1:26 PM  Dimopoulos MA, Rennie Natter, Trotman J, Basilia Jumbo, Leblond V, Mahe B, Herbaux C, Tam C, Orsucci L, [place] ML, Matous JV, [place] C, Kastritis E, [place], Li J, Salman Z, Graef T, Buske C; iNNOVATE Study Group and the [employer] for [employer]. Phase 3 Trial of Ibrutinib plus Rituximab in Waldenstrm's Macroglobulinemia. Alison Stalling J Med. 2018 Jun 21;378(25):2399-2410.   --At 30 months, the progression-free survival rate was 82% with  ibrutinib-rituximab versus 28% with placebo-rituximab (hazard ratio for progression or death, 0.20; P<0.001).

## 2022-09-03 ENCOUNTER — Other Ambulatory Visit (HOSPITAL_COMMUNITY): Payer: Self-pay

## 2022-09-03 LAB — KAPPA/LAMBDA LIGHT CHAINS
Kappa free light chain: 35 mg/L — ABNORMAL HIGH (ref 3.3–19.4)
Kappa, lambda light chain ratio: 9.21 — ABNORMAL HIGH (ref 0.26–1.65)
Lambda free light chains: 3.8 mg/L — ABNORMAL LOW (ref 5.7–26.3)

## 2022-09-04 ENCOUNTER — Other Ambulatory Visit (HOSPITAL_COMMUNITY): Payer: Self-pay

## 2022-09-06 ENCOUNTER — Other Ambulatory Visit: Payer: Self-pay | Admitting: Nurse Practitioner

## 2022-09-06 ENCOUNTER — Other Ambulatory Visit (HOSPITAL_COMMUNITY): Payer: Self-pay

## 2022-09-06 ENCOUNTER — Other Ambulatory Visit (HOSPITAL_COMMUNITY): Payer: Self-pay | Admitting: Family Medicine

## 2022-09-06 DIAGNOSIS — G893 Neoplasm related pain (acute) (chronic): Secondary | ICD-10-CM

## 2022-09-06 DIAGNOSIS — C880 Waldenstrom macroglobulinemia: Secondary | ICD-10-CM

## 2022-09-06 DIAGNOSIS — Z515 Encounter for palliative care: Secondary | ICD-10-CM

## 2022-09-06 LAB — MULTIPLE MYELOMA PANEL, SERUM
Albumin SerPl Elph-Mcnc: 3.7 g/dL (ref 2.9–4.4)
Albumin/Glob SerPl: 2 — ABNORMAL HIGH (ref 0.7–1.7)
Alpha 1: 0.2 g/dL (ref 0.0–0.4)
Alpha2 Glob SerPl Elph-Mcnc: 0.6 g/dL (ref 0.4–1.0)
B-Globulin SerPl Elph-Mcnc: 0.7 g/dL (ref 0.7–1.3)
Gamma Glob SerPl Elph-Mcnc: 0.2 g/dL — ABNORMAL LOW (ref 0.4–1.8)
Globulin, Total: 1.9 g/dL — ABNORMAL LOW (ref 2.2–3.9)
IgA: 12 mg/dL — ABNORMAL LOW (ref 61–437)
IgG (Immunoglobin G), Serum: 156 mg/dL — ABNORMAL LOW (ref 603–1613)
IgM (Immunoglobulin M), Srm: 108 mg/dL (ref 20–172)
M Protein SerPl Elph-Mcnc: 0.1 g/dL — ABNORMAL HIGH
Total Protein ELP: 5.6 g/dL — ABNORMAL LOW (ref 6.0–8.5)

## 2022-09-06 MED ORDER — OXYCODONE HCL 10 MG PO TABS
10.0000 mg | ORAL_TABLET | ORAL | 0 refills | Status: DC | PRN
  Filled 2022-09-07: qty 90, 15d supply, fill #0

## 2022-09-06 MED ORDER — EPLERENONE 25 MG PO TABS
12.5000 mg | ORAL_TABLET | Freq: Every day | ORAL | 3 refills | Status: DC
  Filled 2022-09-06: qty 15, 30d supply, fill #0
  Filled 2022-10-01: qty 15, 30d supply, fill #1
  Filled 2022-10-31: qty 15, 30d supply, fill #2
  Filled 2022-12-06: qty 15, 30d supply, fill #3

## 2022-09-07 ENCOUNTER — Other Ambulatory Visit (HOSPITAL_COMMUNITY): Payer: Self-pay

## 2022-09-13 ENCOUNTER — Other Ambulatory Visit: Payer: Self-pay | Admitting: Nurse Practitioner

## 2022-09-13 DIAGNOSIS — Z515 Encounter for palliative care: Secondary | ICD-10-CM

## 2022-09-13 DIAGNOSIS — C880 Waldenstrom macroglobulinemia: Secondary | ICD-10-CM

## 2022-09-13 DIAGNOSIS — G4700 Insomnia, unspecified: Secondary | ICD-10-CM

## 2022-09-13 DIAGNOSIS — G893 Neoplasm related pain (acute) (chronic): Secondary | ICD-10-CM

## 2022-09-14 ENCOUNTER — Other Ambulatory Visit (HOSPITAL_COMMUNITY): Payer: Self-pay

## 2022-09-14 MED ORDER — DIAZEPAM 5 MG PO TABS
5.0000 mg | ORAL_TABLET | Freq: Every evening | ORAL | 0 refills | Status: DC | PRN
  Filled 2022-09-14 – 2022-09-16 (×2): qty 30, 30d supply, fill #0

## 2022-09-16 ENCOUNTER — Other Ambulatory Visit: Payer: Self-pay | Admitting: *Deleted

## 2022-09-16 ENCOUNTER — Other Ambulatory Visit (HOSPITAL_COMMUNITY): Payer: Self-pay

## 2022-09-16 ENCOUNTER — Other Ambulatory Visit: Payer: Self-pay | Admitting: Hematology and Oncology

## 2022-09-16 MED ORDER — IBRUTINIB 420 MG PO TABS: 420.0000 mg | ORAL_TABLET | Freq: Every day | ORAL | 2 refills | Status: DC

## 2022-09-19 ENCOUNTER — Other Ambulatory Visit: Payer: Self-pay | Admitting: Nurse Practitioner

## 2022-09-19 DIAGNOSIS — C880 Waldenstrom macroglobulinemia: Secondary | ICD-10-CM

## 2022-09-19 DIAGNOSIS — Z515 Encounter for palliative care: Secondary | ICD-10-CM

## 2022-09-19 DIAGNOSIS — G893 Neoplasm related pain (acute) (chronic): Secondary | ICD-10-CM

## 2022-09-20 ENCOUNTER — Other Ambulatory Visit (HOSPITAL_COMMUNITY): Payer: Self-pay

## 2022-09-20 MED ORDER — OXYCODONE HCL 10 MG PO TABS
10.0000 mg | ORAL_TABLET | ORAL | 0 refills | Status: DC | PRN
  Filled 2022-09-20: qty 90, 15d supply, fill #0

## 2022-09-22 IMAGING — DX DG BONE SURVEY MET
10 series · 10 of 10 positions shown · non-contrast
Comparison: Chest CT yesterday.  Chest abdomen pelvis CT 04/07/2021

CLINICAL DATA: MGUS, assess for lytic lesions.

IgM monoclonal gammopathy of uncertain significance.
EXAM:
METASTATIC BONE SURVEY

[skull lat]
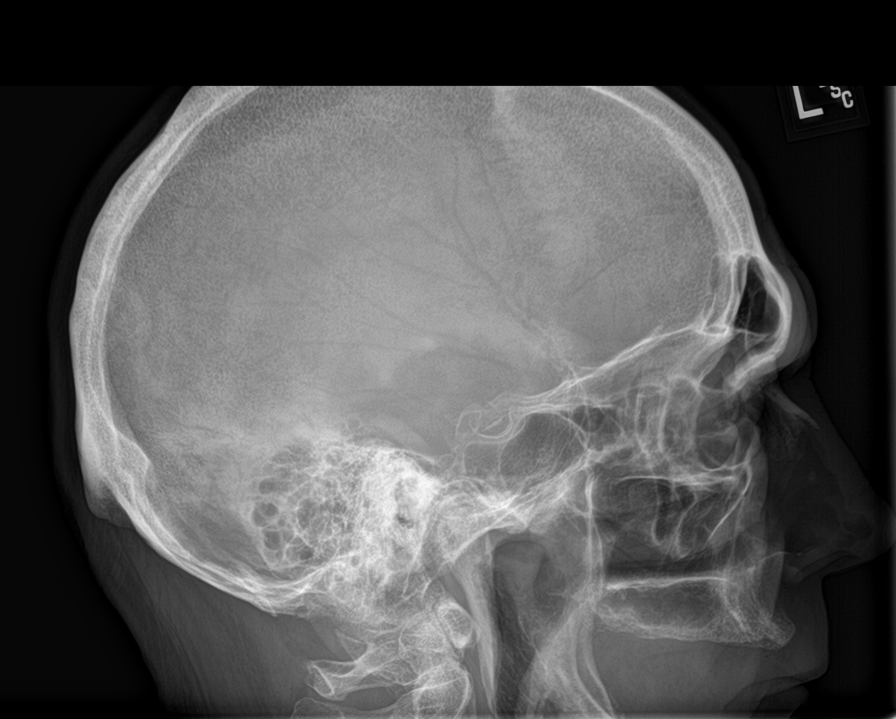

[shoulder ap (1 of 2)]
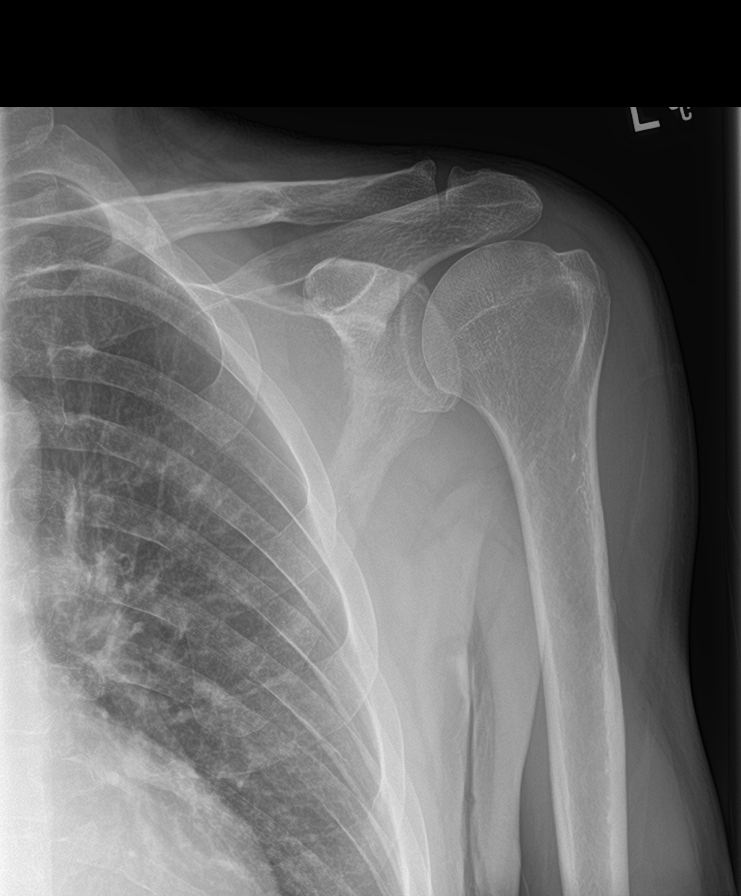

[shoulder ap (2 of 2)]
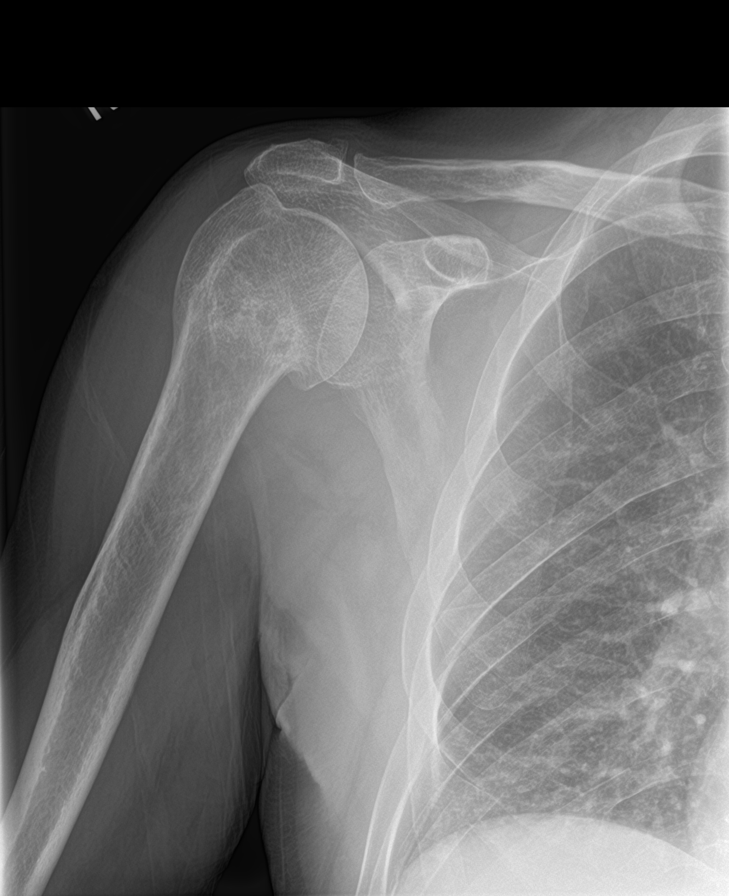

[humerus ap (1 of 2)]
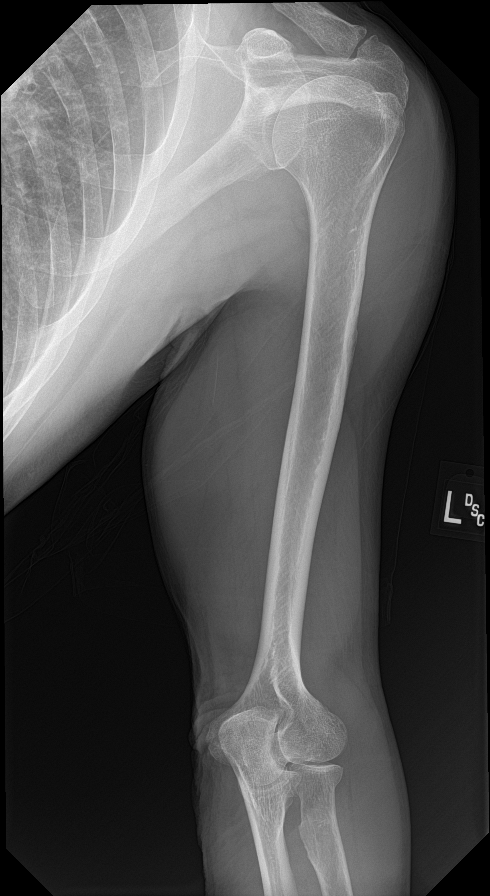

[humerus ap (2 of 2)]
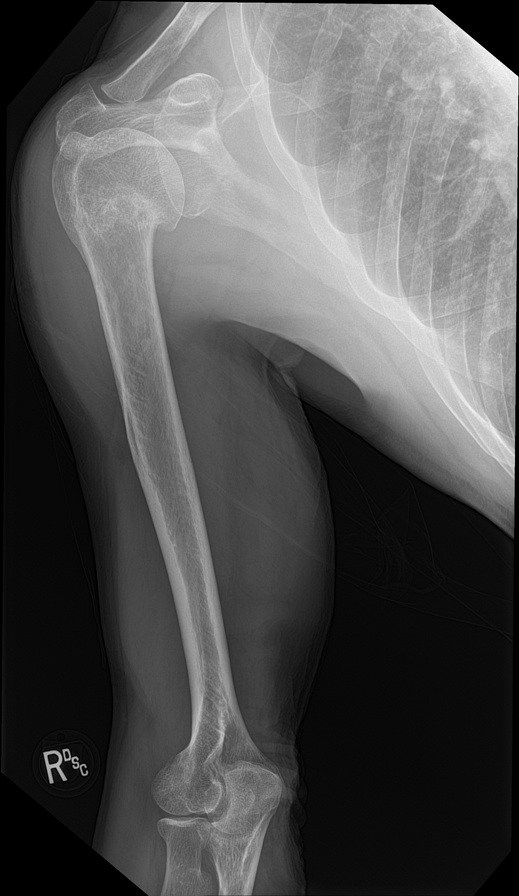

[forearm ap (1 of 2)]
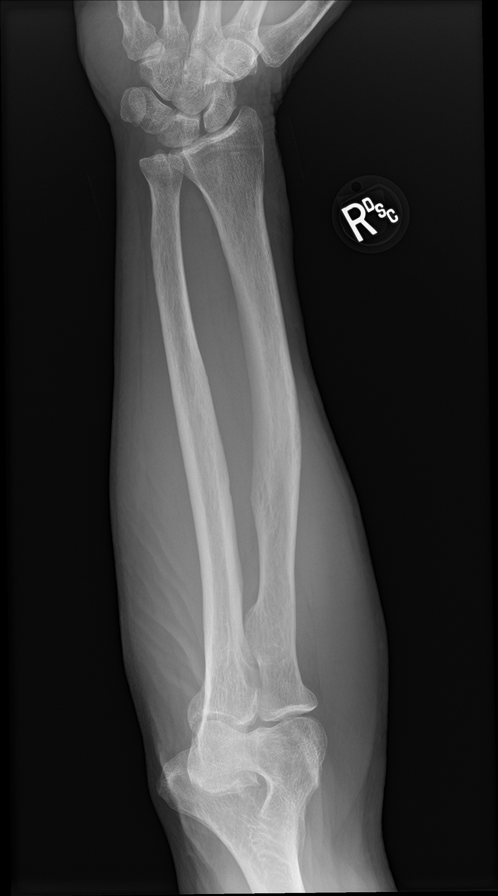

[forearm ap (2 of 2)]
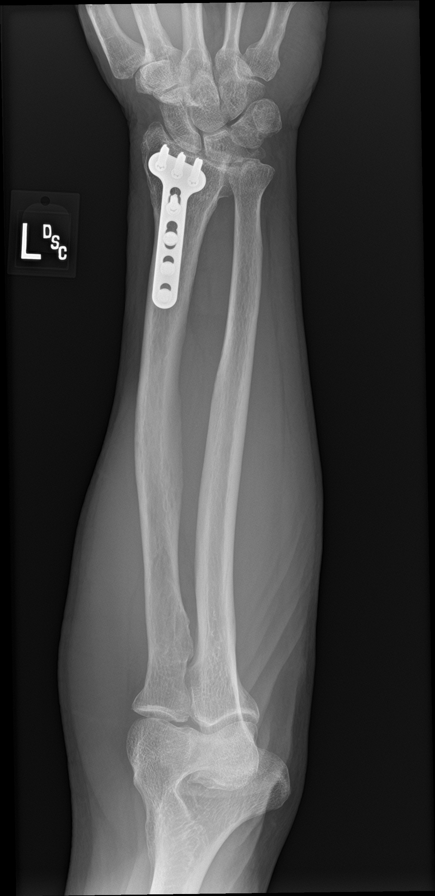

[c-spine ap]
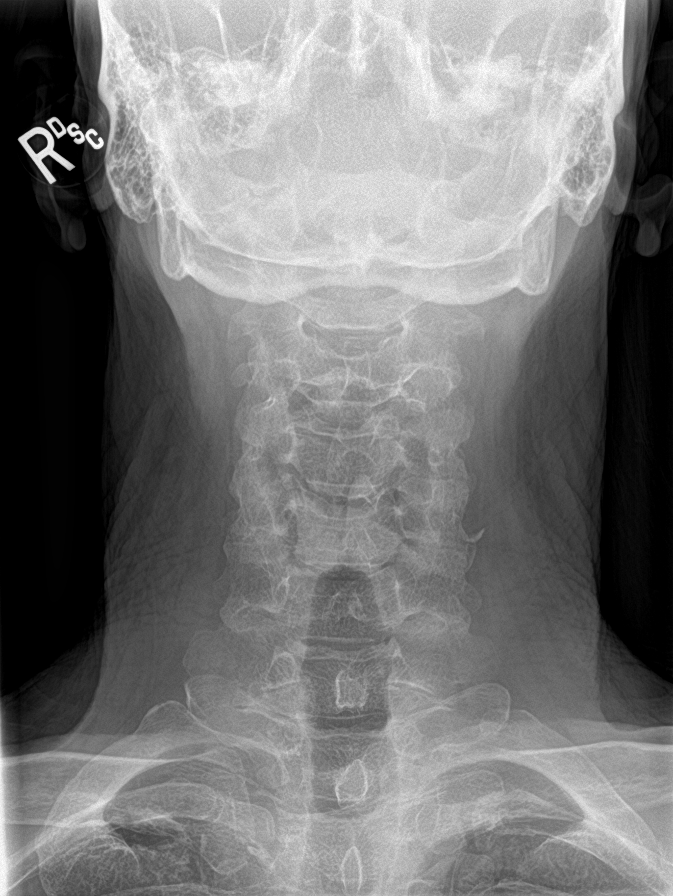

[c-spine lat]
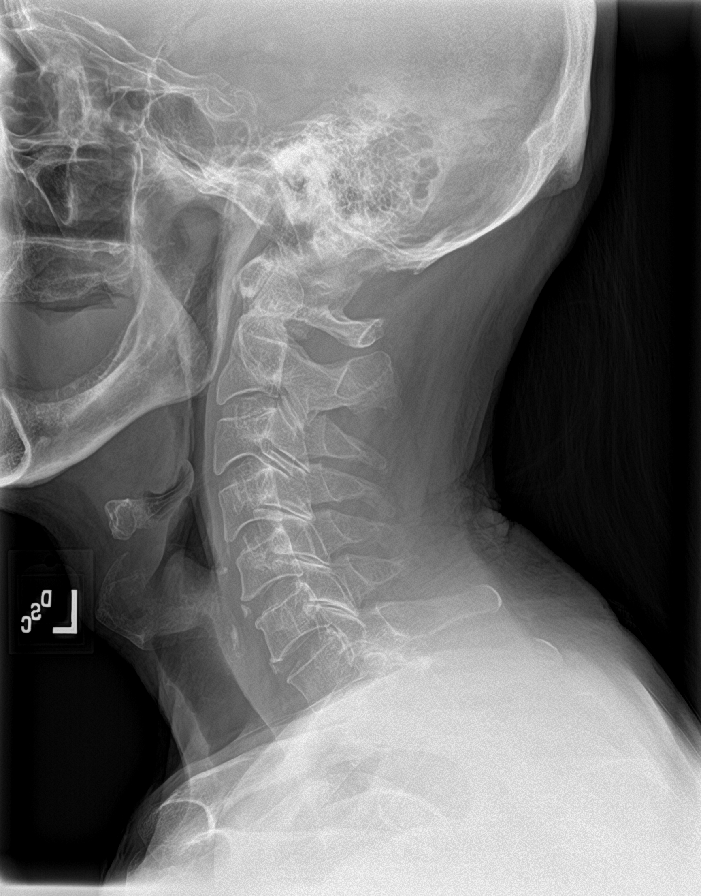

[t-spine ap]
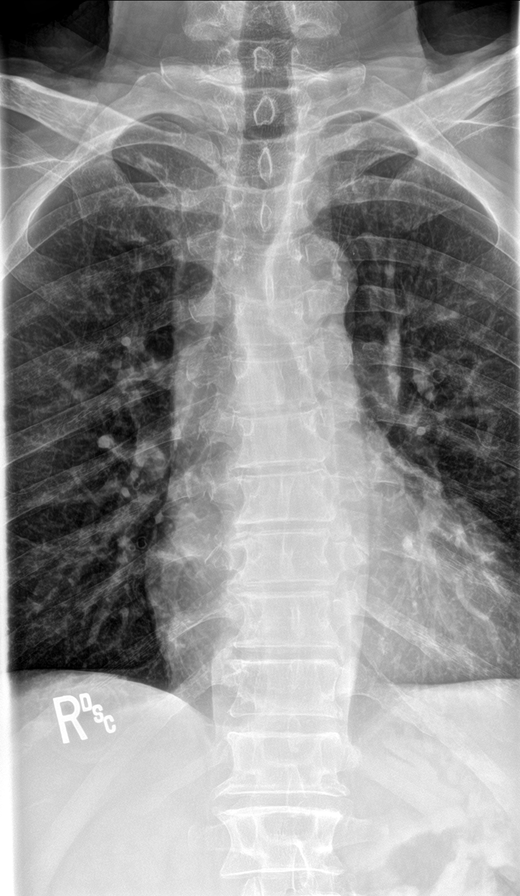

[10 of 10 positions shown; findings below may reference images not displayed]

FINDINGS: No evidence of focal lytic or blastic lesions throughout the axial
or appendicular skeleton. No periosteal reaction or bone
destruction. Remote healed fracture of the right proximal humerus.
Remote healed fracture of the distal left radius with plate and
screw fixation. There is degenerative change throughout the
cervical, thoracic, and lumbar spine. No compression deformity. The
lungs are clear. Heart is normal in size. Normal abdominal bowel gas
pattern.
IMPRESSION: 1. No focal lytic or blastic lesions throughout the axial or
appendicular skeleton.
2. Degenerative change throughout the spine.

## 2022-09-23 ENCOUNTER — Other Ambulatory Visit (HOSPITAL_COMMUNITY): Payer: Self-pay

## 2022-09-24 ENCOUNTER — Other Ambulatory Visit (HOSPITAL_COMMUNITY): Payer: Self-pay

## 2022-09-24 ENCOUNTER — Telehealth: Payer: Self-pay | Admitting: Pharmacy Technician

## 2022-09-24 ENCOUNTER — Telehealth: Payer: Self-pay | Admitting: *Deleted

## 2022-09-24 NOTE — Telephone Encounter (Signed)
Received vm message from pt's significant other, [name]. She states that Connor Solomon is not feeling well. States he has a chest cold, cough.  [name] [facility]. Per Dr. Lorenso Courier, he recommends pt go to Urgent Care or call his PCP. No answer to call but was able to leave above information on identified vm. Advised that she call call back with any further questions or concerns.

## 2022-09-24 NOTE — Telephone Encounter (Signed)
Oral Oncology Patient Advocate Encounter  Prior Authorization for Kate Sable has been approved.    PA# [id number] Effective dates: 09/24/22 through 09/25/23  Patients co-pay is $0.    Lady Deutscher, CPhT-Adv Oncology Pharmacy Patient [place] Direct Number: [phone]  Fax: [phone]

## 2022-09-24 NOTE — Telephone Encounter (Signed)
Oral Oncology Patient Advocate Encounter   Received notification that patient is due for re-enrollment for assistance for Imbruvica through [facility].   Re-enrollment process has been initiated and will be submitted upon completion of necessary documents.  [employer] phone number [phone].   I will continue to follow until final determination.  Lady Deutscher, CPhT-Adv Oncology Pharmacy Patient [place] Direct Number: [phone]  Fax: [phone]

## 2022-09-25 ENCOUNTER — Other Ambulatory Visit (HOSPITAL_COMMUNITY): Payer: Self-pay

## 2022-09-27 ENCOUNTER — Other Ambulatory Visit (HOSPITAL_COMMUNITY): Payer: Self-pay

## 2022-09-27 ENCOUNTER — Other Ambulatory Visit: Payer: Self-pay

## 2022-09-29 ENCOUNTER — Other Ambulatory Visit (HOSPITAL_COMMUNITY): Payer: Self-pay

## 2022-09-30 ENCOUNTER — Telehealth: Payer: Self-pay | Admitting: Nurse Practitioner

## 2022-09-30 ENCOUNTER — Inpatient Hospital Stay: Payer: Self-pay | Admitting: Hematology and Oncology

## 2022-09-30 ENCOUNTER — Inpatient Hospital Stay: Payer: Self-pay | Attending: Hematology and Oncology

## 2022-09-30 ENCOUNTER — Inpatient Hospital Stay: Payer: Self-pay | Admitting: Nurse Practitioner

## 2022-09-30 NOTE — Telephone Encounter (Signed)
Patient did not show for scheduled appointment today. Attempted to reach patient. Unavailable. Voicemail left requesting return call to reschedule.

## 2022-10-01 ENCOUNTER — Other Ambulatory Visit: Payer: Self-pay | Admitting: Hematology and Oncology

## 2022-10-01 ENCOUNTER — Other Ambulatory Visit: Payer: Self-pay | Admitting: Nurse Practitioner

## 2022-10-01 ENCOUNTER — Other Ambulatory Visit (HOSPITAL_COMMUNITY): Payer: Self-pay

## 2022-10-01 ENCOUNTER — Other Ambulatory Visit: Payer: Self-pay

## 2022-10-01 DIAGNOSIS — G893 Neoplasm related pain (acute) (chronic): Secondary | ICD-10-CM

## 2022-10-01 DIAGNOSIS — C880 Waldenstrom macroglobulinemia: Secondary | ICD-10-CM

## 2022-10-01 DIAGNOSIS — Z515 Encounter for palliative care: Secondary | ICD-10-CM

## 2022-10-01 MED ORDER — OXYCODONE HCL 10 MG PO TABS
10.0000 mg | ORAL_TABLET | ORAL | 0 refills | Status: DC | PRN
  Filled 2022-10-01: qty 90, 15d supply, fill #0

## 2022-10-01 MED ORDER — POTASSIUM CHLORIDE CRYS ER 20 MEQ PO TBCR
20.0000 meq | EXTENDED_RELEASE_TABLET | Freq: Every day | ORAL | 1 refills | Status: DC
  Filled 2022-10-01 – 2022-10-07 (×2): qty 30, 30d supply, fill #0
  Filled 2022-11-19: qty 30, 30d supply, fill #1

## 2022-10-05 ENCOUNTER — Other Ambulatory Visit (HOSPITAL_COMMUNITY): Payer: Self-pay

## 2022-10-07 ENCOUNTER — Other Ambulatory Visit (HOSPITAL_COMMUNITY): Payer: Self-pay

## 2022-10-07 ENCOUNTER — Other Ambulatory Visit: Payer: Self-pay

## 2022-10-08 ENCOUNTER — Other Ambulatory Visit (HOSPITAL_COMMUNITY): Payer: Self-pay

## 2022-10-11 ENCOUNTER — Other Ambulatory Visit: Payer: Self-pay | Admitting: Nurse Practitioner

## 2022-10-11 ENCOUNTER — Other Ambulatory Visit (HOSPITAL_COMMUNITY): Payer: Self-pay

## 2022-10-11 DIAGNOSIS — G893 Neoplasm related pain (acute) (chronic): Secondary | ICD-10-CM

## 2022-10-11 DIAGNOSIS — R630 Anorexia: Secondary | ICD-10-CM

## 2022-10-11 DIAGNOSIS — R634 Abnormal weight loss: Secondary | ICD-10-CM

## 2022-10-11 DIAGNOSIS — Z515 Encounter for palliative care: Secondary | ICD-10-CM

## 2022-10-11 DIAGNOSIS — C880 Waldenstrom macroglobulinemia: Secondary | ICD-10-CM

## 2022-10-11 MED ORDER — DRONABINOL 2.5 MG PO CAPS
2.5000 mg | ORAL_CAPSULE | Freq: Two times a day (BID) | ORAL | 0 refills | Status: DC
  Filled 2022-10-11: qty 60, 30d supply, fill #0

## 2022-10-11 MED ORDER — OXYCODONE HCL 10 MG PO TABS
10.0000 mg | ORAL_TABLET | ORAL | 0 refills | Status: DC | PRN
  Filled 2022-10-11: qty 90, 15d supply, fill #0

## 2022-10-12 ENCOUNTER — Other Ambulatory Visit (HOSPITAL_COMMUNITY): Payer: Self-pay

## 2022-10-12 NOTE — Telephone Encounter (Signed)
Oral Oncology Patient Advocate Encounter  Called and spoke with Connor Solomon and Connor Solomon and confirmed that Connor Solomon will be filled at [facility] for future fills.   Patient confirmed they have approximately 28 days of medication on hand. Pharmacy will reach out the week of 1/15 to set up next refill.   Patient voiced understanding.  Lady Deutscher, CPhT-Adv Oncology Pharmacy Patient [place] Direct Number: [phone]  Fax: [phone]

## 2022-10-13 ENCOUNTER — Other Ambulatory Visit (HOSPITAL_COMMUNITY): Payer: Self-pay

## 2022-10-14 ENCOUNTER — Other Ambulatory Visit (HOSPITAL_COMMUNITY): Payer: Self-pay

## 2022-10-14 ENCOUNTER — Other Ambulatory Visit: Payer: Self-pay | Admitting: Nurse Practitioner

## 2022-10-14 DIAGNOSIS — G893 Neoplasm related pain (acute) (chronic): Secondary | ICD-10-CM

## 2022-10-14 DIAGNOSIS — Z515 Encounter for palliative care: Secondary | ICD-10-CM

## 2022-10-14 DIAGNOSIS — C880 Waldenstrom macroglobulinemia: Secondary | ICD-10-CM

## 2022-10-14 DIAGNOSIS — G4700 Insomnia, unspecified: Secondary | ICD-10-CM

## 2022-10-14 MED ORDER — DIAZEPAM 5 MG PO TABS
5.0000 mg | ORAL_TABLET | Freq: Every evening | ORAL | 0 refills | Status: DC | PRN
  Filled 2022-10-14: qty 30, 30d supply, fill #0

## 2022-10-15 ENCOUNTER — Other Ambulatory Visit (HOSPITAL_COMMUNITY): Payer: Self-pay

## 2022-10-19 ENCOUNTER — Other Ambulatory Visit (HOSPITAL_COMMUNITY): Payer: Self-pay

## 2022-10-19 ENCOUNTER — Inpatient Hospital Stay: Payer: 59 | Attending: Hematology and Oncology | Admitting: Nurse Practitioner

## 2022-10-19 DIAGNOSIS — D649 Anemia, unspecified: Secondary | ICD-10-CM | POA: Insufficient documentation

## 2022-10-19 DIAGNOSIS — R059 Cough, unspecified: Secondary | ICD-10-CM | POA: Insufficient documentation

## 2022-10-19 DIAGNOSIS — D472 Monoclonal gammopathy: Secondary | ICD-10-CM | POA: Insufficient documentation

## 2022-10-19 DIAGNOSIS — F419 Anxiety disorder, unspecified: Secondary | ICD-10-CM | POA: Insufficient documentation

## 2022-10-19 DIAGNOSIS — R110 Nausea: Secondary | ICD-10-CM | POA: Insufficient documentation

## 2022-10-19 DIAGNOSIS — C880 Waldenstrom macroglobulinemia: Secondary | ICD-10-CM | POA: Insufficient documentation

## 2022-10-19 DIAGNOSIS — I110 Hypertensive heart disease with heart failure: Secondary | ICD-10-CM | POA: Insufficient documentation

## 2022-10-19 DIAGNOSIS — Z885 Allergy status to narcotic agent status: Secondary | ICD-10-CM | POA: Insufficient documentation

## 2022-10-19 DIAGNOSIS — Z5941 Food insecurity: Secondary | ICD-10-CM | POA: Insufficient documentation

## 2022-10-19 DIAGNOSIS — Z5986 Financial insecurity: Secondary | ICD-10-CM | POA: Insufficient documentation

## 2022-10-19 DIAGNOSIS — E876 Hypokalemia: Secondary | ICD-10-CM | POA: Insufficient documentation

## 2022-10-19 DIAGNOSIS — Z79899 Other long term (current) drug therapy: Secondary | ICD-10-CM | POA: Insufficient documentation

## 2022-10-19 DIAGNOSIS — Z8249 Family history of ischemic heart disease and other diseases of the circulatory system: Secondary | ICD-10-CM | POA: Insufficient documentation

## 2022-10-19 DIAGNOSIS — R0989 Other specified symptoms and signs involving the circulatory and respiratory systems: Secondary | ICD-10-CM | POA: Insufficient documentation

## 2022-10-19 DIAGNOSIS — R112 Nausea with vomiting, unspecified: Secondary | ICD-10-CM | POA: Insufficient documentation

## 2022-10-19 DIAGNOSIS — K5900 Constipation, unspecified: Secondary | ICD-10-CM | POA: Insufficient documentation

## 2022-10-19 DIAGNOSIS — I5022 Chronic systolic (congestive) heart failure: Secondary | ICD-10-CM | POA: Insufficient documentation

## 2022-10-19 NOTE — Progress Notes (Unsigned)
[place]  [phone] Fax:(336) [phone]   Name: Connor Solomon Date: 10/19/2022 MRN: [id number]  DOB: Jul 08, 1961  Patient Care Team: Delene Ruffini, MD as PCP - General (Internal Medicine) Pickenpack-Cousar, Carlena Sax, NP as Nurse Practitioner (Nurse Practitioner)   I connected with [name] on 10/19/22 at 10:00 AM EST by phone and verified that I am speaking with the correct person using two identifiers.   I discussed the limitations, risks, security and privacy concerns of performing an evaluation and management service by telemedicine and the availability of in-person appointments. I also discussed with the patient that there may be a patient responsible charge related to this service. The patient expressed understanding and agreed to proceed.   Other persons participating in the visit and their role in the encounter: ***   Patient's location: home  Provider's location: [facility] office   Chief Complaint: follow up on symptom management   REASON FOR CONSULTATION: Connor Solomon is a 62 y.o. 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 Palliative ask to see for symptom management.  SOCIAL HISTORY:     reports that he quit smoking about 2 years ago. His smoking use included cigars. He has never used smokeless tobacco. He reports that he does not drink alcohol and does not use drugs.  ADVANCE DIRECTIVES:    CODE STATUS:   PAST MEDICAL HISTORY: Past Medical History:  Diagnosis Date   Acute heart failure ([place]) 04/08/2021   Acute HFrEF (heart failure with reduced ejection fraction) ([place]) 04/07/2021   Arthritis    hands, elbows bilaterally   Cancer (HCC)    Full dentures    Hypertension    Pt states he doen not have HTN and has never been treated for HTN   Kidney stone on left side last stone 4-5 yrs ago   recurrent (3  episodes)   Panic disorder    pt states has resolved   Syncope    resolved- was related to panic attacks    PAST SURGICAL HISTORY:  Past Surgical History:  Procedure Laterality Date   CARPAL METACARPAL FUSION WITH DISTAL RADIAL BONE GRAFT Left 05/30/2013   Procedure: LEFT THUMB METACARPAL JOINT FUSION;  Surgeon: Schuyler Amor, MD;  Location: [place];  Service: Orthopedics;  Laterality: Left;   ESOPHAGOGASTRODUODENOSCOPY N/A 02/12/2014   Procedure: ESOPHAGOGASTRODUODENOSCOPY (EGD);  Surgeon: Jerene Bears, MD;  Location: [facility] ENDOSCOPY;  Service: Endoscopy;  Laterality: N/A;   FOREIGN BODY REMOVAL N/A 02/12/2014   Procedure: FOREIGN BODY REMOVAL;  Surgeon: Jerene Bears, MD;  Location: [place];  Service: Endoscopy;  Laterality: N/A;   OPEN REDUCTION INTERNAL FIXATION (ORIF) DISTAL RADIAL FRACTURE Left 11/29/2012   Procedure: OPEN REDUCTION INTERNAL FIXATION (ORIF) DISTAL RADIAL FRACTURE;  Surgeon: Schuyler Amor, MD;  Location: [place];  Service: Orthopedics;  Laterality: Left;  LEFT DISTAL RADIUS OSTEOTOMY WITH BONE GRAFT   RIGHT/LEFT HEART CATH AND CORONARY ANGIOGRAPHY N/A 04/09/2021   Procedure: RIGHT/LEFT HEART CATH AND CORONARY ANGIOGRAPHY;  Surgeon: Jolaine Artist, MD;  Location: [place] CV LAB;  Service: Cardiovascular;  Laterality: N/A;   TENDON REPAIR Left 02/07/2013   Procedure: LEFT EXTENSOR INDICIS PROPRIUS  TO EXTENSOR POLLICIS LONGUS TENDON TRANSFER;  Surgeon: Schuyler Amor, MD;  Location: [place];  Service: Orthopedics;  Laterality: Left;   WISDOM TOOTH EXTRACTION     WRIST  SURGERY     fx only    HEMATOLOGY/ONCOLOGY HISTORY:  Oncology History  Waldenstrom macroglobulinemia (HCC)  05/24/2021 Initial Diagnosis   Waldenstrom macroglobulinemia (HCC)   06/05/2021 - 12/30/2021 Chemotherapy   Patient is on Treatment Plan : NON-HODGKINS LYMPHOMA Rituximab q7d       ALLERGIES:  is allergic to rituxan  [rituximab] and hydrocodone.  MEDICATIONS:  Current Outpatient Medications  Medication Sig Dispense Refill   acetaminophen (TYLENOL) 500 MG tablet Take 500 mg by mouth every 6 (six) hours as needed.     albuterol (PROVENTIL HFA) 108 (90 Base) MCG/ACT inhaler Inhale 2 puffs into the lungs every 6 hours as needed for wheezing or shortness of breath 6.7 g 2   carvedilol (COREG) 3.125 MG tablet Take 1 tablet by mouth 2 times daily. 60 tablet 6   dapagliflozin propanediol (FARXIGA) 10 MG TABS tablet Take 1 tablet (10 mg total) by mouth daily before breakfast. 30 tablet 11   diazepam (VALIUM) 5 MG tablet Take 1 tablet (5 mg total) by mouth at bedtime as needed for anxiety. 30 tablet 0   dronabinol (MARINOL) 2.5 MG capsule Take 1 capsule by mouth 2 times daily before a meal. 60 capsule 0   eplerenone (INSPRA) 25 MG tablet Take 0.5 tablets (12.5 mg total) by mouth daily. 15 tablet 3   ferrous sulfate 325 (65 FE) MG tablet Take 1 tablet by mouth daily with breakfast. Please take with a source of Vitamin C 90 tablet 3   furosemide (LASIX) 20 MG tablet Take 1 tablet (20 mg total) by mouth daily as needed for swelling 100 tablet 2   ibrutinib (IMBRUVICA) 420 MG tablet Take 1 tablet (420 mg total) by mouth daily. 30 tablet 2   methadone (DOLOPHINE) 10 MG tablet Take 1 tablet by mouth every 8 hours. (09/03/22) 90 tablet 0   metoCLOPramide (REGLAN) 10 MG tablet Take 1 tablet (10 mg total) by mouth every 8 (eight) hours as needed for nausea. 30 tablet 1   naloxone (NARCAN) nasal spray 4 mg/0.1 mL Take for overdose 1 each 0   naloxone (NARCAN) nasal spray 4 mg/0.1 mL Take for overdose as directed 2 each 0   ondansetron (ZOFRAN) 8 MG tablet Take 1 tablet (8 mg total) by mouth every 8 (eight) hours as needed. 30 tablet 0   Oxycodone HCl 10 MG TABS Take 1 tablet (10 mg total) by mouth every 4 (four) hours as needed. 90 tablet 0   potassium chloride SA (KLOR-CON M) 20 MEQ tablet Take 1 tablet (20 mEq total) by mouth  daily. 30 tablet 1   sacubitril-valsartan (ENTRESTO) 24-26 MG Take 1 tablet by mouth 2 (two) times daily. 180 tablet 3   No current facility-administered medications for this visit.    VITAL SIGNS: There were no vitals taken for this visit. There were no vitals filed for this visit.     Estimated body mass index is 19.69 kg/m as calculated from the following:   Height as of 09/01/22: 5\' 8"  (1.727 m).   Weight as of 09/01/22: 129 lb 8 oz (58.7 kg).   PERFORMANCE STATUS (ECOG) : 1 - Symptomatic but completely ambulatory   IMPRESSION:    Neoplasm related pain Pain is controlled overall. He is tolerating Methadone 10mg  three times daily. Denies drowsiness. Oxycodone 10mg  as needed for breakthrough pain. Taking as prescribed.  We will plan to continue close monitoring and adjust as needed.   Constipation   Appetite/decreased appetite.  4. Anxiety/Anxiousness    5. Goals of care (5/16)    PLAN: methadone 10 mg every 8 hours. Close monitoring. Tolerating well. Pain controlled. Oxycodone 10 mg as needed for breakthrough pain.  Miralax daily for constipation Valium 5mg  at bedtime. Zofran as needed for nausea as prescribed.  Marinol for appetite  I will plan to see patient back in 4-6 weeks in collaboration to other oncology appointments.    Patient expressed understanding and was in agreement with this plan. He also understands that He can call the clinic at any time with any questions, concerns, or complaints.    Any controlled substances utilized were prescribed in the context of palliative care. PDMP has been reviewed.    Time Total: 35 min   Visit consisted of counseling and education dealing with the complex and emotionally intense issues of symptom management and palliative care in the setting of serious and potentially life-threatening illness.Greater than 50%  of this time was spent counseling and coordinating care related to the above assessment and  plan.  Alda Lea, AGPCNP-BC  Palliative Medicine Team/[place]

## 2022-10-20 ENCOUNTER — Other Ambulatory Visit (HOSPITAL_COMMUNITY): Payer: Self-pay

## 2022-10-20 ENCOUNTER — Other Ambulatory Visit: Payer: Self-pay | Admitting: Nurse Practitioner

## 2022-10-20 DIAGNOSIS — Z515 Encounter for palliative care: Secondary | ICD-10-CM

## 2022-10-20 DIAGNOSIS — C880 Waldenstrom macroglobulinemia: Secondary | ICD-10-CM

## 2022-10-20 DIAGNOSIS — G893 Neoplasm related pain (acute) (chronic): Secondary | ICD-10-CM

## 2022-10-20 MED ORDER — METHADONE HCL 10 MG PO TABS
10.0000 mg | ORAL_TABLET | Freq: Three times a day (TID) | ORAL | 0 refills | Status: DC
  Filled 2022-10-20: qty 90, 30d supply, fill #0

## 2022-10-20 NOTE — Progress Notes (Signed)
Erroneous

## 2022-10-20 NOTE — Telephone Encounter (Signed)
Please advise patient he needs to contact our office at [phone] for any future refills. Thank you

## 2022-10-21 ENCOUNTER — Other Ambulatory Visit (HOSPITAL_COMMUNITY): Payer: Self-pay

## 2022-10-25 ENCOUNTER — Other Ambulatory Visit (HOSPITAL_COMMUNITY): Payer: Self-pay

## 2022-10-25 ENCOUNTER — Other Ambulatory Visit: Payer: Self-pay

## 2022-10-25 MED ORDER — IBRUTINIB 420 MG PO TABS
420.0000 mg | ORAL_TABLET | Freq: Every day | ORAL | 2 refills | Status: DC
  Filled 2022-10-25 (×2): qty 30, 30d supply, fill #0
  Filled 2022-10-26 (×2): qty 28, 28d supply, fill #0
  Filled 2022-11-19: qty 14, 14d supply, fill #1
  Filled 2022-11-19: qty 28, 28d supply, fill #1
  Filled 2022-12-06: qty 6, 6d supply, fill #2
  Filled 2022-12-06: qty 14, 14d supply, fill #2
  Filled 2022-12-06: qty 28, 28d supply, fill #2
  Filled 2022-12-13 – 2023-06-11 (×3): qty 6, 6d supply, fill #3

## 2022-10-26 ENCOUNTER — Telehealth: Payer: Self-pay | Admitting: Hematology and Oncology

## 2022-10-26 ENCOUNTER — Other Ambulatory Visit: Payer: Self-pay

## 2022-10-26 ENCOUNTER — Encounter: Payer: Self-pay | Admitting: Nurse Practitioner

## 2022-10-26 ENCOUNTER — Inpatient Hospital Stay (HOSPITAL_BASED_OUTPATIENT_CLINIC_OR_DEPARTMENT_OTHER): Payer: 59 | Admitting: Nurse Practitioner

## 2022-10-26 ENCOUNTER — Other Ambulatory Visit (HOSPITAL_COMMUNITY): Payer: Self-pay

## 2022-10-26 DIAGNOSIS — G893 Neoplasm related pain (acute) (chronic): Secondary | ICD-10-CM | POA: Diagnosis not present

## 2022-10-26 DIAGNOSIS — R630 Anorexia: Secondary | ICD-10-CM

## 2022-10-26 DIAGNOSIS — Z515 Encounter for palliative care: Secondary | ICD-10-CM | POA: Diagnosis not present

## 2022-10-26 DIAGNOSIS — C880 Waldenstrom macroglobulinemia: Secondary | ICD-10-CM

## 2022-10-26 MED ORDER — OXYCODONE HCL 10 MG PO TABS
10.0000 mg | ORAL_TABLET | ORAL | 0 refills | Status: DC | PRN
  Filled 2022-10-26: qty 90, 15d supply, fill #0

## 2022-10-26 NOTE — Progress Notes (Signed)
[place]  Telephone:(336) [phone] Fax:(336) [phone]   Name: Connor Solomon Date: 10/26/2022 MRN: [id number]  DOB: 1961-06-02  Patient Care Team: Delene Ruffini, MD as PCP - General (Internal Medicine) Pickenpack-Cousar, Carlena Sax, NP as Nurse Practitioner (Nurse Practitioner)   I connected with Connor Solomon on 10/26/22 at 12:00 PM EST by phone and verified that I am speaking with the correct person using two identifiers.   I discussed the limitations, risks, security and privacy concerns of performing an evaluation and management service by telemedicine and the availability of in-person appointments. I also discussed with the patient that there may be a patient responsible charge related to this service. The patient expressed understanding and agreed to proceed.   Other persons participating in the visit and their role in the encounter: [place], RN   Patient's location: home   Provider's location: [facility] Office  Chief Complaint: f/u of symptom management    REASON FOR CONSULTATION: Connor Solomon is a 62 y.o. 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 Palliative ask to see for symptom management.  SOCIAL HISTORY:     reports that he quit smoking about 2 years ago. His smoking use included cigars. He has never used smokeless tobacco. He reports that he does not drink alcohol and does not use drugs.  ADVANCE DIRECTIVES:    CODE STATUS:   PAST MEDICAL HISTORY: Past Medical History:  Diagnosis Date   Acute heart failure ([place]) 04/08/2021   Acute HFrEF (heart failure with reduced ejection fraction) ([place]) 04/07/2021   Arthritis    hands, elbows bilaterally   Cancer (HCC)    Full dentures    Hypertension    Pt states he doen not have HTN and has never been treated for HTN   Kidney stone on left side last stone 4-5 yrs ago   recurrent (3  episodes)   Panic disorder    pt states has resolved   Syncope    resolved- was related to panic attacks    PAST SURGICAL HISTORY:  Past Surgical History:  Procedure Laterality Date   CARPAL METACARPAL FUSION WITH DISTAL RADIAL BONE GRAFT Left 05/30/2013   Procedure: LEFT THUMB METACARPAL JOINT FUSION;  Surgeon: Schuyler Amor, MD;  Location: [place];  Service: Orthopedics;  Laterality: Left;   ESOPHAGOGASTRODUODENOSCOPY N/A 02/12/2014   Procedure: ESOPHAGOGASTRODUODENOSCOPY (EGD);  Surgeon: Jerene Bears, MD;  Location: [facility] ENDOSCOPY;  Service: Endoscopy;  Laterality: N/A;   FOREIGN BODY REMOVAL N/A 02/12/2014   Procedure: FOREIGN BODY REMOVAL;  Surgeon: Jerene Bears, MD;  Location: [place];  Service: Endoscopy;  Laterality: N/A;   OPEN REDUCTION INTERNAL FIXATION (ORIF) DISTAL RADIAL FRACTURE Left 11/29/2012   Procedure: OPEN REDUCTION INTERNAL FIXATION (ORIF) DISTAL RADIAL FRACTURE;  Surgeon: Schuyler Amor, MD;  Location: [place];  Service: Orthopedics;  Laterality: Left;  LEFT DISTAL RADIUS OSTEOTOMY WITH BONE GRAFT   RIGHT/LEFT HEART CATH AND CORONARY ANGIOGRAPHY N/A 04/09/2021   Procedure: RIGHT/LEFT HEART CATH AND CORONARY ANGIOGRAPHY;  Surgeon: Jolaine Artist, MD;  Location: [place] CV LAB;  Service: Cardiovascular;  Laterality: N/A;   TENDON REPAIR Left 02/07/2013   Procedure: LEFT EXTENSOR INDICIS PROPRIUS  TO EXTENSOR POLLICIS LONGUS TENDON TRANSFER;  Surgeon: Schuyler Amor, MD;  Location: [place];  Service: Orthopedics;  Laterality: Left;   WISDOM TOOTH EXTRACTION  WRIST SURGERY     fx only    HEMATOLOGY/ONCOLOGY HISTORY:  Oncology History  Waldenstrom macroglobulinemia ([place])  05/24/2021 Initial Diagnosis   Waldenstrom macroglobulinemia ([place])   06/05/2021 - 12/30/2021 Chemotherapy   Patient is on Treatment Plan : NON-HODGKINS LYMPHOMA Rituximab q7d       ALLERGIES:  is allergic to rituxan  [rituximab] and hydrocodone.  MEDICATIONS:  Current Outpatient Medications  Medication Sig Dispense Refill   acetaminophen (TYLENOL) 500 MG tablet Take 500 mg by mouth every 6 (six) hours as needed.     albuterol (PROVENTIL HFA) 108 (90 Base) MCG/ACT inhaler Inhale 2 puffs into the lungs every 6 hours as needed for wheezing or shortness of breath 6.7 g 2   carvedilol (COREG) 3.125 MG tablet Take 1 tablet by mouth 2 times daily. 60 tablet 6   dapagliflozin propanediol (FARXIGA) 10 MG TABS tablet Take 1 tablet (10 mg total) by mouth daily before breakfast. 30 tablet 11   diazepam (VALIUM) 5 MG tablet Take 1 tablet (5 mg total) by mouth at bedtime as needed for anxiety. 30 tablet 0   dronabinol (MARINOL) 2.5 MG capsule Take 1 capsule by mouth 2 times daily before a meal. 60 capsule 0   eplerenone (INSPRA) 25 MG tablet Take 0.5 tablets (12.5 mg total) by mouth daily. 15 tablet 3   ferrous sulfate 325 (65 FE) MG tablet Take 1 tablet by mouth daily with breakfast. Please take with a source of Vitamin C 90 tablet 3   furosemide (LASIX) 20 MG tablet Take 1 tablet (20 mg total) by mouth daily as needed for swelling 100 tablet 2   ibrutinib (IMBRUVICA) 420 MG tablet Take 1 tablet (420 mg total) by mouth daily. 30 tablet 2   methadone (DOLOPHINE) 10 MG tablet Take 1 tablet by mouth every 8 hours 90 tablet 0   metoCLOPramide (REGLAN) 10 MG tablet Take 1 tablet (10 mg total) by mouth every 8 (eight) hours as needed for nausea. 30 tablet 1   naloxone (NARCAN) nasal spray 4 mg/0.1 mL Take for overdose 1 each 0   naloxone (NARCAN) nasal spray 4 mg/0.1 mL Take for overdose as directed 2 each 0   ondansetron (ZOFRAN) 8 MG tablet Take 1 tablet (8 mg total) by mouth every 8 (eight) hours as needed. 30 tablet 0   Oxycodone HCl 10 MG TABS Take 1 tablet (10 mg total) by mouth every 4 (four) hours as needed. 90 tablet 0   potassium chloride SA (KLOR-CON M) 20 MEQ tablet Take 1 tablet (20 mEq total) by mouth daily. 30  tablet 1   sacubitril-valsartan (ENTRESTO) 24-26 MG Take 1 tablet by mouth 2 (two) times daily. 180 tablet 3   No current facility-administered medications for this visit.    VITAL SIGNS: There were no vitals taken for this visit. There were no vitals filed for this visit.     Estimated body mass index is 19.69 kg/m as calculated from the following:   Height as of 09/01/22: $RemoveBefo'5\' 8"'[id number]$  (1.727 m).   Weight as of 09/01/22: 129 lb 8 oz (58.7 kg).   PERFORMANCE STATUS (ECOG) : 1 - Symptomatic but completely ambulatory   IMPRESSION:  I connected with Mr. Dorrance via phone for symptom management follow-up. No acute distress identified. States over the past week he and his family has been dealing with upper respiratory infection. He is starting to feel much better. Denies nausea, vomiting, constipation, or diarrhea.   Neoplasm related pain Pain is controlled  overall. He is tolerating Methadone 10mg  three times daily. Denies drowsiness. Oxycodone 10mg  as needed for breakthrough pain. Taking as prescribed.  We will plan to continue close monitoring and adjust as needed.   Constipation Controlled with diet and daily miralax.   Appetite/decreased appetite.  Improved with marinol.   4. Anxiety/Anxiousness  Anxiety is managed with as needed valium.   5. Goals of care  (5/16) Mr. Cazeau shares his realistic understanding of his condition. He and his wife are making sure all "affairs" are in order as his biggest worry is leaving his wife in a financial constraint. Reports communicating with his life insurance policy etc. Emotional support provided.    He is emotional expressing racing thoughts during idol time or interruptions in his sleep as his mind wonders thinking about worst case scenario. Is requesting something to assist with sleeping and anxiety. Acknowledged his request. Education provided on limited use in collaboration with other medications including zyprexa. He verbalized understanding  and aware  we will continue to support and monitor for needs.     PLAN: methadone 10 mg every 8 hours. Close monitoring. Tolerating well. Pain controlled. Oxycodone 10 mg as needed for breakthrough pain.  Miralax daily for constipation Valium 5mg  at bedtime. Zofran as needed for nausea as prescribed.  Marinol for appetite  I will plan to see patient back in 4-6 weeks in collaboration to other oncology appointments.    Patient expressed understanding and was in agreement with this plan. He also understands that He can call the clinic at any time with any questions, concerns, or complaints.    Any controlled substances utilized were prescribed in the context of palliative care. PDMP has been reviewed.    Time Total: 20 min   Visit consisted of counseling and education dealing with the complex and emotionally intense issues of symptom management and palliative care in the setting of serious and potentially life-threatening illness.Greater than 50%  of this time was spent counseling and coordinating care related to the above assessment and plan.  Alda Lea, AGPCNP-BC  Palliative Medicine Team/[place]

## 2022-10-26 NOTE — Telephone Encounter (Signed)
Called to r/s missed appointments. Patient r/s and notified.

## 2022-10-27 ENCOUNTER — Other Ambulatory Visit (HOSPITAL_COMMUNITY): Payer: Self-pay

## 2022-10-28 ENCOUNTER — Other Ambulatory Visit (HOSPITAL_COMMUNITY): Payer: Self-pay

## 2022-10-28 ENCOUNTER — Other Ambulatory Visit (HOSPITAL_COMMUNITY): Payer: Self-pay | Admitting: Family Medicine

## 2022-10-28 ENCOUNTER — Inpatient Hospital Stay (HOSPITAL_BASED_OUTPATIENT_CLINIC_OR_DEPARTMENT_OTHER): Payer: 59 | Admitting: Hematology and Oncology

## 2022-10-28 ENCOUNTER — Inpatient Hospital Stay: Payer: 59

## 2022-10-28 ENCOUNTER — Other Ambulatory Visit: Payer: Self-pay

## 2022-10-28 VITALS — BP 118/57 | HR 62 | Temp 97.5°F | Resp 20 | Wt 126.9 lb

## 2022-10-28 DIAGNOSIS — Z885 Allergy status to narcotic agent status: Secondary | ICD-10-CM | POA: Diagnosis not present

## 2022-10-28 DIAGNOSIS — C880 Waldenstrom macroglobulinemia: Secondary | ICD-10-CM

## 2022-10-28 DIAGNOSIS — F419 Anxiety disorder, unspecified: Secondary | ICD-10-CM | POA: Diagnosis not present

## 2022-10-28 DIAGNOSIS — K5900 Constipation, unspecified: Secondary | ICD-10-CM | POA: Diagnosis not present

## 2022-10-28 DIAGNOSIS — R112 Nausea with vomiting, unspecified: Secondary | ICD-10-CM | POA: Diagnosis not present

## 2022-10-28 DIAGNOSIS — I5022 Chronic systolic (congestive) heart failure: Secondary | ICD-10-CM | POA: Diagnosis not present

## 2022-10-28 DIAGNOSIS — D649 Anemia, unspecified: Secondary | ICD-10-CM | POA: Diagnosis not present

## 2022-10-28 DIAGNOSIS — Z8249 Family history of ischemic heart disease and other diseases of the circulatory system: Secondary | ICD-10-CM | POA: Diagnosis not present

## 2022-10-28 DIAGNOSIS — R0989 Other specified symptoms and signs involving the circulatory and respiratory systems: Secondary | ICD-10-CM | POA: Diagnosis not present

## 2022-10-28 DIAGNOSIS — R110 Nausea: Secondary | ICD-10-CM | POA: Diagnosis not present

## 2022-10-28 DIAGNOSIS — Z79899 Other long term (current) drug therapy: Secondary | ICD-10-CM | POA: Diagnosis not present

## 2022-10-28 DIAGNOSIS — Z5986 Financial insecurity: Secondary | ICD-10-CM | POA: Diagnosis not present

## 2022-10-28 DIAGNOSIS — Z5941 Food insecurity: Secondary | ICD-10-CM | POA: Diagnosis not present

## 2022-10-28 DIAGNOSIS — E876 Hypokalemia: Secondary | ICD-10-CM | POA: Diagnosis not present

## 2022-10-28 DIAGNOSIS — I110 Hypertensive heart disease with heart failure: Secondary | ICD-10-CM | POA: Diagnosis not present

## 2022-10-28 DIAGNOSIS — R21 Rash and other nonspecific skin eruption: Secondary | ICD-10-CM

## 2022-10-28 DIAGNOSIS — R059 Cough, unspecified: Secondary | ICD-10-CM | POA: Diagnosis not present

## 2022-10-28 DIAGNOSIS — D472 Monoclonal gammopathy: Secondary | ICD-10-CM | POA: Diagnosis not present

## 2022-10-28 LAB — CMP (CANCER CENTER ONLY)
ALT: 7 U/L (ref 0–44)
AST: 13 U/L — ABNORMAL LOW (ref 15–41)
Albumin: 2.9 g/dL — ABNORMAL LOW (ref 3.5–5.0)
Alkaline Phosphatase: 71 U/L (ref 38–126)
Anion gap: 7 (ref 5–15)
BUN: 10 mg/dL (ref 8–23)
CO2: 27 mmol/L (ref 22–32)
Calcium: 8.2 mg/dL — ABNORMAL LOW (ref 8.9–10.3)
Chloride: 100 mmol/L (ref 98–111)
Creatinine: 0.87 mg/dL (ref 0.61–1.24)
GFR, Estimated: >60 mL/min (ref >60)
Glucose, Bld: 94 mg/dL (ref 70–99)
Potassium: 3.8 mmol/L (ref 3.5–5.1)
Sodium: 134 mmol/L — ABNORMAL LOW (ref 135–145)
Total Bilirubin: 0.4 mg/dL (ref 0.3–1.2)
Total Protein: 5.3 g/dL — ABNORMAL LOW (ref 6.5–8.1)

## 2022-10-28 LAB — CBC WITH DIFFERENTIAL (CANCER CENTER ONLY)
Abs Immature Granulocytes: 0.03 10*3/uL (ref 0.00–0.07)
Basophils Absolute: 0 10*3/uL (ref 0.0–0.1)
Basophils Relative: 0 %
Eosinophils Absolute: 0.4 10*3/uL (ref 0.0–0.5)
Eosinophils Relative: 5 %
HCT: 30.8 % — ABNORMAL LOW (ref 39.0–52.0)
Hemoglobin: 10.4 g/dL — ABNORMAL LOW (ref 13.0–17.0)
Immature Granulocytes: 0 %
Lymphocytes Relative: 12 %
Lymphs Abs: 1.1 10*3/uL (ref 0.7–4.0)
MCH: 28.6 pg (ref 26.0–34.0)
MCHC: 33.8 g/dL (ref 30.0–36.0)
MCV: 84.6 fL (ref 80.0–100.0)
Monocytes Absolute: 0.8 10*3/uL (ref 0.1–1.0)
Monocytes Relative: 10 %
Neutro Abs: 6.3 10*3/uL (ref 1.7–7.7)
Neutrophils Relative %: 73 %
Platelet Count: 282 10*3/uL (ref 150–400)
RBC: 3.64 MIL/uL — ABNORMAL LOW (ref 4.22–5.81)
RDW: 15.7 % — ABNORMAL HIGH (ref 11.5–15.5)
WBC Count: 8.7 10*3/uL (ref 4.0–10.5)
nRBC: 0 % (ref 0.0–0.2)

## 2022-10-28 LAB — LACTATE DEHYDROGENASE: LDH: 159 U/L (ref 98–192)

## 2022-10-28 NOTE — Progress Notes (Signed)
[place] [phone]   [phone]  PROGRESS NOTE  Patient Care Team: Delene Ruffini, MD as PCP - General (Internal Medicine) Pickenpack-Cousar, Carlena Sax, NP as Nurse Practitioner (Nurse Practitioner)  Hematological/Oncological History # Waldenstrom's Macroglobulinemia # IgM Monoclonal Gammopathy 04/07/2021: CT A/p showed mildly enlarged retroperitoneal lymph nodes and bilateral inguinal lymph nodes.  04/08/2021: IgM Kappa M protein 1.2 04/09/2021: WBC 11.1, Hgb 9.5, MCV 75.4, Plt 525 04/20/2021: Kappa 977, Lambda 7.8, K/L ratio 125.28. Serum viscosity 1.8 05/06/2021: Establish care with Dr. 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 Infusion reaction with ritux, held halfway through.  06/12/2021: Cycle 2 Day 1 of Ibrutinib + Rituximab 06/19/2021: Cycle 3 Day 1 of Ibrutinib + Rituximab 06/26/2021: Cycle 4 Day 1 of rituximab. Continued on daily ibrutinib 12/02/2021: Cycle 5 Day 1 of Ibrutinib + Rituximab 12/09/2021: Cycle 6 Day 1 of Ibrutinib + Rituximab 12/16/2021: Cycle 7 Day 1 of Ibrutinib + Rituximab 12/23/2021: Held Cycle 8 of Ritxumab due to nausea/vomiting, elevated creatinine, finger infection  Interval History:  Connor Solomon 62 y.o. male with medical history significant for Waldenstrom's macroglobulinemia who presents for a follow up visit. The patient's last visit was on 09/01/2022. In the interim since the last visit he has continued on Ibrutinib therapy.   On exam today [name] reports his wife is not with him as she is babysitting today.  He reports in December 2023 he "got real sick".  He notes he was able to drive up here.  He is having a lot of nausea and vomiting and a lot of coughing.  He reports he had a lot of inflammation in his lungs and a terrible  chest congestion.  He notes he has improved markedly since that time.  His appetite has been quite poor as he has not had access to his Marinol.  He reports it has been refilled for him but he is unable to pick it up.  He is working with Lexine Baton to get this sorted out.  He is lost 3 pounds in the interim.  He is taking his ibrutinib faithfully and is not having any major side effects as a result of it.  He denies any bleeding, bruising, or palpitations of the heart.  He is having worsening dryness and cracking of the skin on his hands and is now spread to his back.  It is not affecting his legs or feet.  He denies fevers, chills, night sweats, shortness of breath, chest pain or cough. He has no other complaints. Full 10 point ROS is listed below.   MEDICAL HISTORY:  Past Medical History:  Diagnosis Date   Acute heart failure ([place]) 04/08/2021   Acute HFrEF (heart failure with reduced ejection fraction) ([place]) 04/07/2021   Arthritis    hands, elbows bilaterally   Cancer ([place])    Full dentures    Hypertension    Pt states he doen not have HTN and has never been treated for HTN   Kidney stone on left side last stone 4-5 yrs ago   recurrent (3 episodes)   Panic disorder    pt states has resolved   Syncope    resolved- was related to panic attacks    SURGICAL HISTORY: Past Surgical History:  Procedure Laterality Date   CARPAL METACARPAL FUSION WITH DISTAL RADIAL BONE GRAFT Left 05/30/2013   Procedure:  LEFT THUMB METACARPAL JOINT FUSION;  Surgeon: [name], MD;  Location: [facility];  Service: Orthopedics;  Laterality: Left;   ESOPHAGOGASTRODUODENOSCOPY N/A 02/12/2014   Procedure: ESOPHAGOGASTRODUODENOSCOPY (EGD);  Surgeon: [name], MD;  Location: [facility] ENDOSCOPY;  Service: Endoscopy;  Laterality: N/A;   FOREIGN BODY REMOVAL N/A 02/12/2014   Procedure: FOREIGN BODY REMOVAL;  Surgeon: [name], MD;  Location: [facility] ENDOSCOPY;  Service: Endoscopy;  Laterality: N/A;   OPEN  REDUCTION INTERNAL FIXATION (ORIF) DISTAL RADIAL FRACTURE Left 11/29/2012   Procedure: OPEN REDUCTION INTERNAL FIXATION (ORIF) DISTAL RADIAL FRACTURE;  Surgeon: [name], MD;  Location: [facility];  Service: Orthopedics;  Laterality: Left;  LEFT DISTAL RADIUS OSTEOTOMY WITH BONE GRAFT   RIGHT/LEFT HEART CATH AND CORONARY ANGIOGRAPHY N/A 04/09/2021   Procedure: RIGHT/LEFT HEART CATH AND CORONARY ANGIOGRAPHY;  Surgeon: [name], MD;  Location: [facility];  Service: Cardiovascular;  Laterality: N/A;   TENDON REPAIR Left 02/07/2013   Procedure: LEFT EXTENSOR INDICIS PROPRIUS  TO EXTENSOR POLLICIS LONGUS TENDON TRANSFER;  Surgeon: [name], MD;  Location: [facility];  Service: Orthopedics;  Laterality: Left;   WISDOM TOOTH EXTRACTION     WRIST SURGERY     fx only    SOCIAL HISTORY: Social History   Socioeconomic History   Marital status: Single    Spouse name: Not on file   Number of children: Not on file   Years of education: Not on file   Highest education level: Not on file  Occupational History   Not on file  Tobacco Use   Smoking status: Former    Types: Cigars    Quit date: 10/11/2020    Years since quitting: 2.0   Smokeless tobacco: Never   Tobacco comments:    smokes 1 cigar a week  Vaping Use   Vaping Use: Never used  Substance and Sexual Activity   Alcohol use: No   Drug use: No   Sexual activity: Not on file  Other Topics Concern   Not on file  Social History Narrative   Not on file   Social Determinants of Health   Financial Resource Strain: High Risk (07/17/2021)   Overall Financial Resource Strain (CARDIA)    Difficulty of Paying Living Expenses: Very hard  Food Insecurity: Food Insecurity Present (04/10/2021)   Hunger Vital Sign    Worried About Running Out of Food in the Last Year: Sometimes true    Ran Out of Food in the Last Year: Sometimes true  Transportation Needs: No Transportation Needs  (04/10/2021)   PRAPARE - Administrator, Civil Service (Medical): No    Lack of Transportation (Non-Medical): No  Physical Activity: Not on file  Stress: Stress Concern Present (09/01/2021)   [employer] of Occupational Health - Occupational Stress Questionnaire    Feeling of Stress : Very much  Social Connections: Not on file  Intimate Partner Violence: Not on file    FAMILY HISTORY: Family History  Problem Relation Age of Onset   Heart attack Father    Sudden Cardiac Death Father 31   Diabetes Neg Hx    Hyperlipidemia Neg Hx    Hypertension Neg Hx     ALLERGIES:  is allergic to rituxan [rituximab] and hydrocodone.  MEDICATIONS:  Current Outpatient Medications  Medication Sig Dispense Refill   acetaminophen (TYLENOL) 500 MG tablet Take 500 mg by mouth every 6 (six) hours as needed.  albuterol (PROVENTIL HFA) 108 (90 Base) MCG/ACT inhaler Inhale 2 puffs into the lungs every 6 hours as needed for wheezing or shortness of breath 6.7 g 2   carvedilol (COREG) 3.125 MG tablet Take 1 tablet by mouth 2 times daily. 60 tablet 6   dapagliflozin propanediol (FARXIGA) 10 MG TABS tablet Take 1 tablet (10 mg total) by mouth daily before breakfast. 30 tablet 11   diazepam (VALIUM) 5 MG tablet Take 1 tablet (5 mg total) by mouth at bedtime as needed for anxiety. 30 tablet 0   dronabinol (MARINOL) 2.5 MG capsule Take 1 capsule by mouth 2 times daily before a meal. 60 capsule 0   eplerenone (INSPRA) 25 MG tablet Take 0.5 tablets (12.5 mg total) by mouth daily. 15 tablet 3   ferrous sulfate 325 (65 FE) MG tablet Take 1 tablet by mouth daily with breakfast. Please take with a source of Vitamin C 90 tablet 3   furosemide (LASIX) 20 MG tablet Take 1 tablet (20 mg total) by mouth daily as needed for swelling 100 tablet 2   ibrutinib (IMBRUVICA) 420 MG tablet Take 1 tablet (420 mg total) by mouth daily. 28 tablet 2   methadone (DOLOPHINE) 10 MG tablet Take 1 tablet by mouth every 8  hours 90 tablet 0   metoCLOPramide (REGLAN) 10 MG tablet Take 1 tablet (10 mg total) by mouth every 8 (eight) hours as needed for nausea. 30 tablet 1   naloxone (NARCAN) nasal spray 4 mg/0.1 mL Take for overdose 1 each 0   naloxone (NARCAN) nasal spray 4 mg/0.1 mL Take for overdose as directed 2 each 0   ondansetron (ZOFRAN) 8 MG tablet Take 1 tablet (8 mg total) by mouth every 8 (eight) hours as needed. 30 tablet 0   Oxycodone HCl 10 MG TABS Take 1 tablet (10 mg total) by mouth every 4 (four) hours as needed. 90 tablet 0   potassium chloride SA (KLOR-CON M) 20 MEQ tablet Take 1 tablet (20 mEq total) by mouth daily. 30 tablet 1   sacubitril-valsartan (ENTRESTO) 24-26 MG Take 1 tablet by mouth 2 (two) times daily. 180 tablet 3   No current facility-administered medications for this visit.    REVIEW OF SYSTEMS:   Constitutional: ( - ) fevers, ( - )  chills , ( - ) night sweats Eyes: ( - ) blurriness of vision, ( - ) double vision, ( - ) watery eyes Ears, nose, mouth, throat, and face: ( - ) mucositis, ( - ) sore throat Respiratory: ( - ) cough, ( - ) dyspnea, ( - ) wheezes Cardiovascular: ( - ) palpitation, ( - ) chest discomfort, ( - ) lower extremity swelling Gastrointestinal:  ( + ) nausea, ( - ) heartburn, ( - ) change in bowel habits Skin: ( - ) abnormal skin rashes Lymphatics: ( - ) new lymphadenopathy, ( - ) easy bruising Neurological: ( - ) numbness, ( - ) tingling, ( - ) new weaknesses Behavioral/Psych: ( - ) mood change, ( - ) new changes  All other systems were reviewed with the patient and are negative.  PHYSICAL EXAMINATION: ECOG PERFORMANCE STATUS: 1 - Symptomatic but completely ambulatory  Vitals:   10/28/22 1150  BP: (!) 118/57  Pulse: 62  Resp: 20  Temp: (!) 97.5 F (36.4 C)  SpO2: 100%       Filed Weights   10/28/22 1150  Weight: 126 lb 14.4 oz (57.6 kg)      GENERAL: Chronically ill  appearing middle-aged Caucasian male, alert, no distress and  comfortable SKIN: Dry cracking rash of hands bilaterally, now affecting entire upper extremity bilaterally as well as back.  No issues on the chest, face, or lower extremities.  Skin color, texture, turgor are normal, no rashes or significant lesions.  EYES: conjunctiva are pink and non-injected, sclera clear LUNGS: clear to auscultation and percussion with normal breathing effort HEART: regular rate & rhythm and no murmurs and no lower extremity edema  PSYCH: alert & oriented x 3, fluent speech NEURO: no focal motor/sensory deficits   LABORATORY DATA:  I have reviewed the data as listed    Latest Ref Rng & Units 10/28/2022   11:10 AM 09/01/2022   10:02 AM 08/05/2022   10:36 AM  CBC  WBC 4.0 - 10.5 K/uL 8.7  6.5  6.7   Hemoglobin 13.0 - 17.0 g/dL 10.4  13.6  12.4   Hematocrit 39.0 - 52.0 % 30.8  41.5  37.6   Platelets 150 - 400 K/uL 282  285  267        Latest Ref Rng & Units 10/28/2022   11:10 AM 09/01/2022   10:02 AM 08/05/2022   10:36 AM  CMP  Glucose 70 - 99 mg/dL 94  106  106   BUN 8 - 23 mg/dL $Remove'10  13  13   'gfCDTqb$ Creatinine 0.61 - 1.24 mg/dL 0.87  1.01  0.78   Sodium 135 - 145 mmol/L 134  139  134   Potassium 3.5 - 5.1 mmol/L 3.8  4.0  4.3   Chloride 98 - 111 mmol/L 100  106  107   CO2 22 - 32 mmol/L $RemoveB'27  25  24   'BfKLqyEe$ Calcium 8.9 - 10.3 mg/dL 8.2  9.0  8.4   Total Protein 6.5 - 8.1 g/dL 5.3  6.5  5.3   Total Bilirubin 0.3 - 1.2 mg/dL 0.4  0.8  0.5   Alkaline Phos 38 - 126 U/L 71  56  51   AST 15 - 41 U/L $Remo'13  18  13   'LSsDZ$ ALT 0 - 44 U/L $Remo'7  14  14     'fLXRF$ Lab Results  Component Value Date   MPROTEIN 0.1 (H) 09/01/2022   MPROTEIN 0.1 (H) 07/06/2022   MPROTEIN 0.1 (H) 06/10/2022   Lab Results  Component Value Date   KPAFRELGTCHN 35.0 (H) 09/01/2022   KPAFRELGTCHN 26.2 (H) 07/06/2022   KPAFRELGTCHN 21.4 (H) 06/10/2022   LAMBDASER 3.8 (L) 09/01/2022   LAMBDASER 4.3 (L) 07/06/2022   LAMBDASER 4.1 (L) 06/10/2022   KAPLAMBRATIO 9.21 (H) 09/01/2022   KAPLAMBRATIO 6.09 (H) 07/06/2022    KAPLAMBRATIO 5.22 (H) 06/10/2022    RADIOGRAPHIC STUDIES: No results found.  ASSESSMENT & PLAN CORNEY KNIGHTON 62 y.o. male with medical history significant for Waldenstrom's macroglobulinemia who presents for a follow up visit.   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  There is no clear evidence of hyperviscosity syndrome at this time.  At this time we will plan to proceed with ibrutinib and rituximab therapy.  One could also consider Bendamustine and rituximab therapy but given the patient's degree of anemia I would prefer pursuing ibrutinib and rituximab at this time.  Additionally ibrutinib/rituximab are category 1 recommendations per the NCCN guidelines.  The treatment will consist of ibrutinib 420 mg p.o. daily with rituximab on weeks 1 through 4 as well as  weeks 27 through 30.  Previously we discussed the risks and benefits of therapy including (but not limited to) risk of bleeding, fatigue, atypical infections, and cardiac arrhythmias.  The patient voices understanding of this plan moving forward.   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 --Started second round of weekly rituximab on 12/02/2021. Continued weekly x 4 weeks.   --last rituximab dose held due to missed visits/illness.  Plan: --Labs today were reviewed without any intervention needed.  Labs show white cell count 8.7, hemoglobin 10.4, MCV 84.6, and platelets of 282.  Pending SPEP and sFLC levels but prior levels appear stable. --continues on ibrutinib 420 mg PO daily.  --RTC in  4 weeks with labs   #Rash-worsening -- Affecting hands bilaterally, upper extremities bilaterally, and back.  Not affecting face, chest, or lower extremities. -- Patient has dryness and cracking as well as erythema on the upper extremities. -- Etiology is unclear.  Patient is using hydrating creams but is not effective.  Making referral to dermatology and [place] for evaluation.  # Hypokalemia-improving -- Potassium 3.8, continue to monitor   #Pain Management --Pain was poorly controlled on hydrocodone therapy previously.  --Patient was previously admitted for for opioid overdose with presumed fentanyl --Currently pain regimen includes methadone 10 mg TID per palliative care.  -- Appreciate recommendations of palliative care.  #Normocytic Anemia  --Hgb 10.4 --previously there was a component of iron deficiency anemia as well.  Currently on PO ferrous sulfate to try and bolster his iron levels.  --Continue to monitor.  #Nausea and vomiting--improving -- Concern that the nausea and vomiting may not be related to the ibrutinib therapy and potentially tied to poor gastric motility due to opioid prescription.  --Unable to tolerate olanzopine so discontinued --Not taking metoclopramide 10 mg every 8 hours  --continue zofran PRN.   #Supportive Care -- chemotherapy education complete -- port placement not required   Orders Placed This Encounter  Procedures   Ambulatory referral to Dermatology    Referral Priority:   Routine    Referral Type:   Consultation    Referral Reason:   Specialty Services Required    Requested Specialty:   Dermatology    Number of Visits Requested:   1   All questions were answered. The patient knows to call the clinic with any problems, questions or concerns.  I have spent a total of 30 minutes minutes of face-to-face and non-face-to-face time, preparing to see the patient,  performing a medically appropriate examination, counseling and educating the  patient, ordering medications/tests, referring and communicating with other health care professionals, documenting clinical information in the electronic health record, and care coordination.   Ledell Peoples, MD Department of Hematology/Oncology [place] at [facility] Phone: [phone] Pager: [phone] Email: [email or web address]  10/28/2022 5:11 PM  Dimopoulos MA, Rennie Natter, Trotman J, Basilia Jumbo, Leblond V, Mahe B, Herbaux C, Tam C, Orsucci L, [place] ML, Matous JV, [place] C, Kastritis E, [place], Li J, Salman Z, Graef T, Buske C; iNNOVATE Study Group and the [employer] for [employer]. Phase 3 Trial of Ibrutinib plus Rituximab in Waldenstrm's Macroglobulinemia. Alison Stalling J Med. 2018 Jun 21;378(25):2399-2410.   --At 30 months, the progression-free survival rate was 82% with ibrutinib-rituximab  versus 28% with placebo-rituximab (hazard ratio for progression or death, 0.20; P<0.001).

## 2022-10-29 ENCOUNTER — Telehealth: Payer: Self-pay | Admitting: Hematology and Oncology

## 2022-10-29 ENCOUNTER — Other Ambulatory Visit (HOSPITAL_COMMUNITY): Payer: Self-pay

## 2022-10-29 LAB — KAPPA/LAMBDA LIGHT CHAINS
Kappa free light chain: 60.3 mg/L — ABNORMAL HIGH (ref 3.3–19.4)
Kappa, lambda light chain ratio: 4.31 — ABNORMAL HIGH (ref 0.26–1.65)
Lambda free light chains: 14 mg/L (ref 5.7–26.3)

## 2022-10-29 NOTE — Telephone Encounter (Signed)
Called patient per 1/18 los notes. Spoke with patient's significant other. Patient scheduled and will be notified.

## 2022-11-01 ENCOUNTER — Other Ambulatory Visit (HOSPITAL_COMMUNITY): Payer: Self-pay

## 2022-11-02 LAB — MULTIPLE MYELOMA PANEL, SERUM
Albumin SerPl Elph-Mcnc: 2.7 g/dL — ABNORMAL LOW (ref 2.9–4.4)
Albumin/Glob SerPl: 1.2 (ref 0.7–1.7)
Alpha 1: 0.3 g/dL (ref 0.0–0.4)
Alpha2 Glob SerPl Elph-Mcnc: 0.7 g/dL (ref 0.4–1.0)
B-Globulin SerPl Elph-Mcnc: 0.6 g/dL — ABNORMAL LOW (ref 0.7–1.3)
Gamma Glob SerPl Elph-Mcnc: 0.8 g/dL (ref 0.4–1.8)
Globulin, Total: 2.4 g/dL (ref 2.2–3.9)
IgA: 46 mg/dL — ABNORMAL LOW (ref 61–437)
IgG (Immunoglobin G), Serum: 784 mg/dL (ref 603–1613)
IgM (Immunoglobulin M), Srm: 97 mg/dL (ref 20–172)
M Protein SerPl Elph-Mcnc: 0.2 g/dL — ABNORMAL HIGH
Total Protein ELP: 5.1 g/dL — ABNORMAL LOW (ref 6.0–8.5)

## 2022-11-03 ENCOUNTER — Other Ambulatory Visit (HOSPITAL_COMMUNITY): Payer: Self-pay

## 2022-11-04 ENCOUNTER — Ambulatory Visit: Payer: Self-pay | Admitting: Dermatology

## 2022-11-04 ENCOUNTER — Other Ambulatory Visit (HOSPITAL_COMMUNITY): Payer: Self-pay

## 2022-11-06 ENCOUNTER — Other Ambulatory Visit: Payer: Self-pay | Admitting: Nurse Practitioner

## 2022-11-06 DIAGNOSIS — C880 Waldenstrom macroglobulinemia: Secondary | ICD-10-CM

## 2022-11-06 DIAGNOSIS — G4700 Insomnia, unspecified: Secondary | ICD-10-CM

## 2022-11-06 DIAGNOSIS — Z515 Encounter for palliative care: Secondary | ICD-10-CM

## 2022-11-06 DIAGNOSIS — G893 Neoplasm related pain (acute) (chronic): Secondary | ICD-10-CM

## 2022-11-08 ENCOUNTER — Other Ambulatory Visit (HOSPITAL_COMMUNITY): Payer: Self-pay

## 2022-11-08 ENCOUNTER — Other Ambulatory Visit: Payer: Self-pay

## 2022-11-08 MED ORDER — DIAZEPAM 5 MG PO TABS
5.0000 mg | ORAL_TABLET | Freq: Every evening | ORAL | 0 refills | Status: DC | PRN
  Filled 2022-11-08 – 2022-11-12 (×2): qty 30, 30d supply, fill #0

## 2022-11-08 MED ORDER — OXYCODONE HCL 10 MG PO TABS
10.0000 mg | ORAL_TABLET | ORAL | 0 refills | Status: DC | PRN
  Filled 2022-11-09: qty 90, 15d supply, fill #0

## 2022-11-09 ENCOUNTER — Other Ambulatory Visit: Payer: Self-pay

## 2022-11-09 ENCOUNTER — Other Ambulatory Visit (HOSPITAL_COMMUNITY): Payer: Self-pay

## 2022-11-12 ENCOUNTER — Encounter: Payer: Self-pay | Admitting: Internal Medicine

## 2022-11-12 ENCOUNTER — Other Ambulatory Visit (HOSPITAL_COMMUNITY): Payer: Self-pay

## 2022-11-12 ENCOUNTER — Other Ambulatory Visit: Payer: Self-pay | Admitting: Internal Medicine

## 2022-11-12 MED ORDER — ALBUTEROL SULFATE HFA 108 (90 BASE) MCG/ACT IN AERS
2.0000 | INHALATION_SPRAY | Freq: Four times a day (QID) | RESPIRATORY_TRACT | 2 refills | Status: DC | PRN
  Filled 2022-11-12: qty 6.7, 30d supply, fill #0
  Filled 2023-05-12: qty 6.7, 30d supply, fill #1

## 2022-11-12 NOTE — Progress Notes (Deleted)
   CC: hearing  HPI:[name] is a 62 y.o. male who presents for evaluation of ***. Please see individual problem based A/P for details.  1 yom with hx waldenstrom macroglobulinemia, tinnitus left, OA, HFrEF, HTN who presents with complaints of hearing trouble  Hearing - WM neuro manifestations can occur and include cranial nerve palsies, and sudden deafness. He has bee following with Onc for his WM. He is on ibrutinib, but this shouldn't cause hearing trouble. He is also on rituximab which can cause peripheral sensory neuropathy, but he doesn't seem to have been getting this for some time.  - Rinne and weber test - ear exam Message onc about this ENT refer - would urgent refer if pulsatile, unilateral, sudden hearing loss, ear or facial pain, dizziness, or other neuro abnormalities  HFrEF - on entresto   Depression, PHQ-9: Based on the patients  [place] Visit from 07/16/2021 in [place]  PHQ-9 Total Score 0      score we have ***.  Past Medical History:  Diagnosis Date   Acute heart failure ([place]) 04/08/2021   Acute HFrEF (heart failure with reduced ejection fraction) ([place]) 04/07/2021   Arthritis    hands, elbows bilaterally   Cancer ([place])    Full dentures    Hypertension    Pt states he doen not have HTN and has never been treated for HTN   Kidney stone on left side last stone 4-5 yrs ago   recurrent (3 episodes)   Panic disorder    pt states has resolved   Syncope    resolved- was related to panic attacks   Review of Systems:   ROS   Physical Exam: There were no vitals filed for this visit.   General: *** HEENT: Conjunctiva nl , antiicteric sclerae, moist mucous membranes, no exudate or erythema Cardiovascular: Normal rate, regular rhythm.  No murmurs, rubs, or gallops Pulmonary : Equal breath sounds, No wheezes, rales, or rhonchi Abdominal: soft, nontender,  bowel sounds present Ext: No edema in lower extremities,  no tenderness to palpation of lower extremities.   Assessment & Plan:   See Encounters Tab for problem based charting.  Patient {GC/GE:[id number]::"discussed with","seen with"} Dr. {[id number]::"G. Hoffman","Guilloud","Mullen","Narendra","Raines","Vincent","Williams"}

## 2022-11-15 ENCOUNTER — Other Ambulatory Visit (HOSPITAL_COMMUNITY): Payer: Self-pay

## 2022-11-16 ENCOUNTER — Encounter: Payer: Self-pay | Admitting: Internal Medicine

## 2022-11-16 ENCOUNTER — Other Ambulatory Visit (HOSPITAL_COMMUNITY): Payer: Self-pay

## 2022-11-16 ENCOUNTER — Other Ambulatory Visit: Payer: Self-pay

## 2022-11-16 ENCOUNTER — Other Ambulatory Visit: Payer: Self-pay | Admitting: Nurse Practitioner

## 2022-11-16 DIAGNOSIS — C880 Waldenstrom macroglobulinemia: Secondary | ICD-10-CM

## 2022-11-16 DIAGNOSIS — R630 Anorexia: Secondary | ICD-10-CM

## 2022-11-16 DIAGNOSIS — R634 Abnormal weight loss: Secondary | ICD-10-CM

## 2022-11-16 DIAGNOSIS — Z515 Encounter for palliative care: Secondary | ICD-10-CM

## 2022-11-16 MED ORDER — DRONABINOL 5 MG PO CAPS
5.0000 mg | ORAL_CAPSULE | Freq: Two times a day (BID) | ORAL | 0 refills | Status: DC
  Filled 2022-11-16: qty 60, 30d supply, fill #0

## 2022-11-17 ENCOUNTER — Other Ambulatory Visit (HOSPITAL_COMMUNITY): Payer: Self-pay

## 2022-11-18 ENCOUNTER — Other Ambulatory Visit (HOSPITAL_COMMUNITY): Payer: Self-pay

## 2022-11-19 ENCOUNTER — Other Ambulatory Visit: Payer: Self-pay

## 2022-11-19 ENCOUNTER — Other Ambulatory Visit (HOSPITAL_COMMUNITY): Payer: Self-pay

## 2022-11-22 ENCOUNTER — Telehealth: Payer: Self-pay | Admitting: Pharmacy Technician

## 2022-11-22 ENCOUNTER — Other Ambulatory Visit: Payer: Self-pay

## 2022-11-22 ENCOUNTER — Other Ambulatory Visit (HOSPITAL_COMMUNITY): Payer: Self-pay

## 2022-11-22 NOTE — Telephone Encounter (Signed)
Oral Oncology Patient Advocate Encounter  Called and spoke to representative at [facility] [phone].  Representative advised me that they are unable to see the exact amount of funding available to patients and that only the patient can call in and request additional funding and information.  I have sent a my chart message to the patient regarding this and left a voicemail at [phone].  Patient must call [place], CPhT-Adv Oncology Pharmacy Patient [place] Direct Number: [phone]  Fax: [phone]

## 2022-11-23 ENCOUNTER — Other Ambulatory Visit: Payer: Self-pay | Admitting: Nurse Practitioner

## 2022-11-23 ENCOUNTER — Other Ambulatory Visit (HOSPITAL_COMMUNITY): Payer: Self-pay

## 2022-11-23 DIAGNOSIS — G893 Neoplasm related pain (acute) (chronic): Secondary | ICD-10-CM

## 2022-11-23 DIAGNOSIS — Z515 Encounter for palliative care: Secondary | ICD-10-CM

## 2022-11-23 DIAGNOSIS — C880 Waldenstrom macroglobulinemia: Secondary | ICD-10-CM

## 2022-11-23 MED ORDER — OXYCODONE HCL 10 MG PO TABS
10.0000 mg | ORAL_TABLET | ORAL | 0 refills | Status: DC | PRN
  Filled 2022-11-23: qty 90, 15d supply, fill #0

## 2022-11-25 ENCOUNTER — Encounter: Payer: Self-pay | Admitting: Nurse Practitioner

## 2022-11-25 ENCOUNTER — Other Ambulatory Visit: Payer: Self-pay

## 2022-11-25 ENCOUNTER — Inpatient Hospital Stay (HOSPITAL_BASED_OUTPATIENT_CLINIC_OR_DEPARTMENT_OTHER): Payer: Medicaid Other | Admitting: Nurse Practitioner

## 2022-11-25 ENCOUNTER — Inpatient Hospital Stay: Payer: Medicaid Other | Attending: Hematology and Oncology

## 2022-11-25 ENCOUNTER — Inpatient Hospital Stay (HOSPITAL_BASED_OUTPATIENT_CLINIC_OR_DEPARTMENT_OTHER): Payer: Medicaid Other | Admitting: Hematology and Oncology

## 2022-11-25 ENCOUNTER — Other Ambulatory Visit (HOSPITAL_COMMUNITY): Payer: Self-pay

## 2022-11-25 VITALS — BP 137/80 | HR 71 | Temp 98.1°F | Resp 18 | Wt 135.9 lb

## 2022-11-25 DIAGNOSIS — R634 Abnormal weight loss: Secondary | ICD-10-CM

## 2022-11-25 DIAGNOSIS — I110 Hypertensive heart disease with heart failure: Secondary | ICD-10-CM | POA: Insufficient documentation

## 2022-11-25 DIAGNOSIS — K5900 Constipation, unspecified: Secondary | ICD-10-CM

## 2022-11-25 DIAGNOSIS — R21 Rash and other nonspecific skin eruption: Secondary | ICD-10-CM

## 2022-11-25 DIAGNOSIS — Z79899 Other long term (current) drug therapy: Secondary | ICD-10-CM | POA: Diagnosis not present

## 2022-11-25 DIAGNOSIS — Z515 Encounter for palliative care: Secondary | ICD-10-CM | POA: Diagnosis not present

## 2022-11-25 DIAGNOSIS — Z885 Allergy status to narcotic agent status: Secondary | ICD-10-CM | POA: Diagnosis not present

## 2022-11-25 DIAGNOSIS — L539 Erythematous condition, unspecified: Secondary | ICD-10-CM | POA: Insufficient documentation

## 2022-11-25 DIAGNOSIS — R110 Nausea: Secondary | ICD-10-CM | POA: Diagnosis not present

## 2022-11-25 DIAGNOSIS — I5022 Chronic systolic (congestive) heart failure: Secondary | ICD-10-CM | POA: Diagnosis not present

## 2022-11-25 DIAGNOSIS — R5383 Other fatigue: Secondary | ICD-10-CM | POA: Insufficient documentation

## 2022-11-25 DIAGNOSIS — R630 Anorexia: Secondary | ICD-10-CM | POA: Diagnosis not present

## 2022-11-25 DIAGNOSIS — G893 Neoplasm related pain (acute) (chronic): Secondary | ICD-10-CM

## 2022-11-25 DIAGNOSIS — D472 Monoclonal gammopathy: Secondary | ICD-10-CM | POA: Diagnosis not present

## 2022-11-25 DIAGNOSIS — C880 Waldenstrom macroglobulinemia: Secondary | ICD-10-CM | POA: Diagnosis not present

## 2022-11-25 DIAGNOSIS — Z5986 Financial insecurity: Secondary | ICD-10-CM | POA: Diagnosis not present

## 2022-11-25 LAB — CBC WITH DIFFERENTIAL (CANCER CENTER ONLY)
Abs Immature Granulocytes: 0.02 10*3/uL (ref 0.00–0.07)
Basophils Absolute: 0.1 10*3/uL (ref 0.0–0.1)
Basophils Relative: 1 %
Eosinophils Absolute: 1.4 10*3/uL — ABNORMAL HIGH (ref 0.0–0.5)
Eosinophils Relative: 15 %
HCT: 35.2 % — ABNORMAL LOW (ref 39.0–52.0)
Hemoglobin: 11.3 g/dL — ABNORMAL LOW (ref 13.0–17.0)
Immature Granulocytes: 0 %
Lymphocytes Relative: 13 %
Lymphs Abs: 1.2 10*3/uL (ref 0.7–4.0)
MCH: 28.1 pg (ref 26.0–34.0)
MCHC: 32.1 g/dL (ref 30.0–36.0)
MCV: 87.6 fL (ref 80.0–100.0)
Monocytes Absolute: 0.6 10*3/uL (ref 0.1–1.0)
Monocytes Relative: 6 %
Neutro Abs: 6 10*3/uL (ref 1.7–7.7)
Neutrophils Relative %: 65 %
Platelet Count: 303 10*3/uL (ref 150–400)
RBC: 4.02 MIL/uL — ABNORMAL LOW (ref 4.22–5.81)
RDW: 16 % — ABNORMAL HIGH (ref 11.5–15.5)
WBC Count: 9.3 10*3/uL (ref 4.0–10.5)
nRBC: 0 % (ref 0.0–0.2)

## 2022-11-25 LAB — CMP (CANCER CENTER ONLY)
ALT: 7 U/L (ref 0–44)
AST: 11 U/L — ABNORMAL LOW (ref 15–41)
Albumin: 3.4 g/dL — ABNORMAL LOW (ref 3.5–5.0)
Alkaline Phosphatase: 60 U/L (ref 38–126)
Anion gap: 7 (ref 5–15)
BUN: 10 mg/dL (ref 8–23)
CO2: 25 mmol/L (ref 22–32)
Calcium: 8.4 mg/dL — ABNORMAL LOW (ref 8.9–10.3)
Chloride: 103 mmol/L (ref 98–111)
Creatinine: 0.7 mg/dL (ref 0.61–1.24)
GFR, Estimated: >60 mL/min (ref >60)
Glucose, Bld: 98 mg/dL (ref 70–99)
Potassium: 3.8 mmol/L (ref 3.5–5.1)
Sodium: 135 mmol/L (ref 135–145)
Total Bilirubin: 0.4 mg/dL (ref 0.3–1.2)
Total Protein: 5.7 g/dL — ABNORMAL LOW (ref 6.5–8.1)

## 2022-11-25 LAB — LACTATE DEHYDROGENASE: LDH: 154 U/L (ref 98–192)

## 2022-11-25 MED ORDER — PREDNISONE 5 MG PO TABS
ORAL_TABLET | ORAL | 0 refills | Status: AC
  Filled 2022-11-25: qty 35, 15d supply, fill #0

## 2022-11-25 MED ORDER — DOXYCYCLINE HYCLATE 100 MG PO TABS
100.0000 mg | ORAL_TABLET | Freq: Two times a day (BID) | ORAL | 0 refills | Status: DC
  Filled 2022-11-25: qty 20, 10d supply, fill #0

## 2022-11-25 NOTE — Progress Notes (Signed)
[place]  [phone] [phone]   Name: Connor Solomon Date: 11/25/2022 MRN: [id number]  DOB: 29-Jan-1961  Patient Care Team: Delene Ruffini, MD as PCP - General (Internal Medicine) Pickenpack-Cousar, Carlena Sax, NP as Nurse Practitioner (Nurse Practitioner)   REASON FOR CONSULTATION: Connor Solomon is a 62 y.o. 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 Palliative ask to see for symptom management.  SOCIAL HISTORY:     reports that he quit smoking about 2 years ago. His smoking use included cigars. He has never used smokeless tobacco. He reports that he does not drink alcohol and does not use drugs.  ADVANCE DIRECTIVES:    CODE STATUS:   PAST MEDICAL HISTORY: Past Medical History:  Diagnosis Date   Acute heart failure ([place]) 04/08/2021   Acute HFrEF (heart failure with reduced ejection fraction) ([place]) 04/07/2021   Arthritis    hands, elbows bilaterally   Cancer ([facility])    Full dentures    Hypertension    Pt states he doen not have HTN and has never been treated for HTN   Kidney stone on left side last stone 4-5 yrs ago   recurrent (3 episodes)   Panic disorder    pt states has resolved   Syncope    resolved- was related to panic attacks    PAST SURGICAL HISTORY:  Past Surgical History:  Procedure Laterality Date   CARPAL METACARPAL FUSION WITH DISTAL RADIAL BONE GRAFT Left 05/30/2013   Procedure: LEFT THUMB METACARPAL JOINT FUSION;  Surgeon: Schuyler Amor, MD;  Location: [place];  Service: Orthopedics;  Laterality: Left;   ESOPHAGOGASTRODUODENOSCOPY N/A 02/12/2014   Procedure: ESOPHAGOGASTRODUODENOSCOPY (EGD);  Surgeon: Jerene Bears, MD;  Location: [facility] ENDOSCOPY;  Service: Endoscopy;  Laterality: N/A;   FOREIGN BODY REMOVAL N/A 02/12/2014   Procedure: FOREIGN BODY REMOVAL;  Surgeon: Jerene Bears,  MD;  Location: [place];  Service: Endoscopy;  Laterality: N/A;   OPEN REDUCTION INTERNAL FIXATION (ORIF) DISTAL RADIAL FRACTURE Left 11/29/2012   Procedure: OPEN REDUCTION INTERNAL FIXATION (ORIF) DISTAL RADIAL FRACTURE;  Surgeon: Schuyler Amor, MD;  Location: [place];  Service: Orthopedics;  Laterality: Left;  LEFT DISTAL RADIUS OSTEOTOMY WITH BONE GRAFT   RIGHT/LEFT HEART CATH AND CORONARY ANGIOGRAPHY N/A 04/09/2021   Procedure: RIGHT/LEFT HEART CATH AND CORONARY ANGIOGRAPHY;  Surgeon: Jolaine Artist, MD;  Location: [place] [facility];  Service: Cardiovascular;  Laterality: N/A;   TENDON REPAIR Left 02/07/2013   Procedure: LEFT EXTENSOR INDICIS PROPRIUS  TO EXTENSOR POLLICIS LONGUS TENDON TRANSFER;  Surgeon: Schuyler Amor, MD;  Location: [place];  Service: Orthopedics;  Laterality: Left;   WISDOM TOOTH EXTRACTION     WRIST SURGERY     fx only    HEMATOLOGY/ONCOLOGY HISTORY:  Oncology History  Waldenstrom macroglobulinemia ([place])  05/24/2021 Initial Diagnosis   Waldenstrom macroglobulinemia ([place])   06/05/2021 - 12/30/2021 Chemotherapy   Patient is on Treatment Plan : NON-HODGKINS LYMPHOMA Rituximab q7d       ALLERGIES:  is allergic to rituxan [rituximab] and hydrocodone.  MEDICATIONS:  Current Outpatient Medications  Medication Sig Dispense Refill   acetaminophen (TYLENOL) 500 MG tablet Take 500 mg by mouth every 6 (six) hours as needed.     albuterol (PROVENTIL HFA) 108 (90 Base) MCG/ACT inhaler Inhale 2 puffs into the lungs every 6 (six) hours as needed for wheezing  or shortness of breath 6.7 g 2   carvedilol (COREG) 3.125 MG tablet Take 1 tablet by mouth 2 times daily. 60 tablet 6   dapagliflozin propanediol (FARXIGA) 10 MG TABS tablet Take 1 tablet (10 mg total) by mouth daily before breakfast. 30 tablet 11   diazepam (VALIUM) 5 MG tablet Take 1 tablet (5 mg total) by mouth at bedtime as needed for anxiety. 30 tablet 0    dronabinol (MARINOL) 5 MG capsule Take 1 capsule (5 mg total) by mouth 2 (two) times daily before a meal. 60 capsule 0   eplerenone (INSPRA) 25 MG tablet Take 0.5 tablets (12.5 mg total) by mouth daily. 15 tablet 3   ferrous sulfate 325 (65 FE) MG tablet Take 1 tablet by mouth daily with breakfast. Please take with a source of Vitamin C 90 tablet 3   furosemide (LASIX) 20 MG tablet Take 1 tablet (20 mg total) by mouth daily as needed for swelling 100 tablet 2   ibrutinib (IMBRUVICA) 420 MG tablet Take 1 tablet (420 mg total) by mouth daily. 28 tablet 2   methadone (DOLOPHINE) 10 MG tablet Take 1 tablet by mouth every 8 hours 90 tablet 0   metoCLOPramide (REGLAN) 10 MG tablet Take 1 tablet (10 mg total) by mouth every 8 (eight) hours as needed for nausea. 30 tablet 1   naloxone (NARCAN) nasal spray 4 mg/0.1 mL Take for overdose 1 each 0   naloxone (NARCAN) nasal spray 4 mg/0.1 mL Take for overdose as directed 2 each 0   ondansetron (ZOFRAN) 8 MG tablet Take 1 tablet (8 mg total) by mouth every 8 (eight) hours as needed. 30 tablet 0   Oxycodone HCl 10 MG TABS Take 1 tablet by mouth every 4 hours as needed. 90 tablet 0   potassium chloride SA (KLOR-CON M) 20 MEQ tablet Take 1 tablet (20 mEq total) by mouth daily. 30 tablet 1   sacubitril-valsartan (ENTRESTO) 24-26 MG Take 1 tablet by mouth 2 (two) times daily. 180 tablet 3   No current facility-administered medications for this visit.    VITAL SIGNS: There were no vitals taken for this visit. There were no vitals filed for this visit.     Estimated body mass index is 19.3 kg/m as calculated from the following:   Height as of 09/01/22: 5\' 8"  (1.727 m).   Weight as of 10/28/22: 126 lb 14.4 oz (57.6 kg).          PERFORMANCE STATUS (ECOG) : 1 - Symptomatic but completely ambulatory   IMPRESSION: Connor Solomon presents to clinic today for symptom management follow-up. No acute distress. Is trying to remain as active as possible. Denies  nausea, vomiting, constipation, or diarrhea. Is taking things one day at a time.   He has bilateral upper extremity rash with irritation in brachial area. Left arm edematous. Denies pain. Does itch. Izack reports this has happened before and eventually cleared itself up. Denies any contact with irritants. Has been cleaning with water and mild soap. Denies fever.   Patient also being seen by Dr. Lorenso Courier. Will defer treatment to his judgement. Per discussions patient may need to establish care with Dermatologist. Referral placed with [facility]. Spoke with office staff. They will contact patient with appointment.     Neoplasm related pain Sarvesh reports overall pain is well controlled. Some days are better than others.   We discussed regimen:Methadone 10mg  three times daily. Oxycodone 10mg  as needed for breakthrough pain. Taking as prescribed. Reill  request within appropriate timing.   We will continue close monitoring and adjust as needed.   Constipation Controlled with daily Miralax and diet. Reports eating green leafy vegetables and lots of fruit.   Appetite/decreased appetite.  Continues to improve with use of marinol. Can tell if he misses a dose. Is appreciative that he has an appetite and gaining some weight. Shares he is able to enjoy foods that he previously enjoyed. Current weight is 135lbs up from 126lbs.   4. Anxiety/Anxiousness  Well controlled with as needed valium.   5. Goals of care  (5/16) Mr. Stefanko shares his realistic understanding of his condition. He and his wife are making sure all "affairs" are in order as his biggest worry is leaving his wife in a financial constraint. Reports communicating with his life insurance policy etc. Emotional support provided.    He is emotional expressing racing thoughts during idol time or interruptions in his sleep as his mind wonders thinking about worst case scenario. Is requesting something to assist with sleeping and  anxiety. Acknowledged his request. Education provided on limited use in collaboration with other medications including zyprexa. He verbalized understanding and aware  we will continue to support and monitor for needs.      PLAN: Methadone 10 mg every 8 hours. Close monitoring. Tolerating well. Pain controlled. Oxycodone 10 mg as needed for breakthrough pain.  Miralax daily for constipation Valium 5mg  at bedtime. Zofran as needed for nausea as prescribed.  Marinol for appetite. Weight increasing.  Referral placed for [facility] Dermatologist. Office staff aware and will call patient to schedule appointment.  I will plan to see patient back in 4-6 weeks in collaboration to other oncology appointments.    Patient expressed understanding and was in agreement with this plan. He also understands that He can call the clinic at any time with any questions, concerns, or complaints.    Any controlled substances utilized were prescribed in the context of palliative care. PDMP has been reviewed.    Time Total: 20 min   Visit consisted of counseling and education dealing with the complex and emotionally intense issues of symptom management and palliative care in the setting of serious and potentially life-threatening illness.Greater than 50%  of this time was spent counseling and coordinating care related to the above assessment and plan.  Alda Lea, AGPCNP-BC  Palliative Medicine Team/[facility]

## 2022-11-26 LAB — KAPPA/LAMBDA LIGHT CHAINS
Kappa free light chain: 39.4 mg/L — ABNORMAL HIGH (ref 3.3–19.4)
Kappa, lambda light chain ratio: 4.53 — ABNORMAL HIGH (ref 0.26–1.65)
Lambda free light chains: 8.7 mg/L (ref 5.7–26.3)

## 2022-11-28 ENCOUNTER — Other Ambulatory Visit: Payer: Self-pay | Admitting: Nurse Practitioner

## 2022-11-28 DIAGNOSIS — Z515 Encounter for palliative care: Secondary | ICD-10-CM

## 2022-11-28 DIAGNOSIS — C880 Waldenstrom macroglobulinemia: Secondary | ICD-10-CM

## 2022-11-28 DIAGNOSIS — G893 Neoplasm related pain (acute) (chronic): Secondary | ICD-10-CM

## 2022-11-28 MED ORDER — METHADONE HCL 10 MG PO TABS
10.0000 mg | ORAL_TABLET | Freq: Three times a day (TID) | ORAL | 0 refills | Status: DC
  Filled 2022-11-28: qty 90, 30d supply, fill #0

## 2022-11-28 NOTE — Progress Notes (Signed)
Connor Solomon [phone]   [phone]  PROGRESS NOTE  Patient Care Team: Delene Ruffini, MD as PCP - General (Internal Medicine) Pickenpack-Cousar, Carlena Sax, NP as Nurse Practitioner (Nurse Practitioner)  Hematological/Oncological History # Waldenstrom's Macroglobulinemia # IgM Monoclonal Gammopathy 04/07/2021: CT A/p showed mildly enlarged retroperitoneal lymph nodes and bilateral inguinal lymph nodes.  04/08/2021: IgM Kappa M protein 1.2 04/09/2021: WBC 11.1, Hgb 9.5, MCV 75.4, Plt 525 04/20/2021: Kappa 977, Lambda 7.8, K/L ratio 125.28. Serum viscosity 1.8 05/06/2021: Establish care with Dr. 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 Infusion reaction with ritux, held halfway through.  06/12/2021: Cycle 2 Day 1 of Ibrutinib + Rituximab 06/19/2021: Cycle 3 Day 1 of Ibrutinib + Rituximab 06/26/2021: Cycle 4 Day 1 of rituximab. Continued on daily ibrutinib 12/02/2021: Cycle 5 Day 1 of Ibrutinib + Rituximab 12/09/2021: Cycle 6 Day 1 of Ibrutinib + Rituximab 12/16/2021: Cycle 7 Day 1 of Ibrutinib + Rituximab 12/23/2021: Held Cycle 8 of Ritxumab due to nausea/vomiting, elevated creatinine, finger infection  Interval History:  Connor Solomon 62 y.o. male with medical history significant for Waldenstrom's macroglobulinemia who presents for a follow up visit. The patient's last visit was on 09/01/2022. In the interim since the last visit he has continued on Ibrutinib therapy.   On exam today Connor Solomon reports he unfortunately has developed a severe rash up to his elbows bilaterally on both arms.  He reports that he started little bumps and they get quite erythematous and began peeling.  He reports that this is about the third time this has happened.  He has responded well in the  past to antibiotic therapy and steroids.  He notes that he does put hydrogen peroxide on his arms at night to try to help with this.  He notes it can be tender and painful.  Other than his rash everything else is okay.  His energy is "so-so".  He is having some bouts of fatigue and some days are better than others.  He reports he is not having any bleeding or bruising.  He denies any nausea, vomiting, or diarrhea and reports that it is improved from prior.  He denies fevers, chills, night sweats, shortness of breath, chest pain or cough. He has no other complaints. Full 10 point ROS is listed below.   MEDICAL HISTORY:  Past Medical History:  Diagnosis Date   Acute heart failure ([place]) 04/08/2021   Acute HFrEF (heart failure with reduced ejection fraction) ([place]) 04/07/2021   Arthritis    hands, elbows bilaterally   Cancer ([facility])    Full dentures    Hypertension    Pt states he doen not have HTN and has never been treated for HTN   Kidney stone on left side last stone 4-5 yrs ago   recurrent (3 episodes)   Panic disorder    pt states has resolved   Syncope    resolved- was related to panic attacks    SURGICAL HISTORY: Past Surgical History:  Procedure Laterality Date   CARPAL METACARPAL FUSION WITH DISTAL RADIAL BONE GRAFT Left 05/30/2013   Procedure: LEFT THUMB METACARPAL JOINT FUSION;  Surgeon: Schuyler Amor, MD;  Location: [facility];  Service: Orthopedics;  Laterality: Left;   ESOPHAGOGASTRODUODENOSCOPY N/A 02/12/2014   Procedure: ESOPHAGOGASTRODUODENOSCOPY (EGD);  Surgeon: Jerene Bears, MD;  Location: [facility];  Service:  Endoscopy;  Laterality: N/A;   FOREIGN BODY REMOVAL N/A 02/12/2014   Procedure: FOREIGN BODY REMOVAL;  Surgeon: Jerene Bears, MD;  Location: [facility];  Service: Endoscopy;  Laterality: N/A;   OPEN REDUCTION INTERNAL FIXATION (ORIF) DISTAL RADIAL FRACTURE Left 11/29/2012   Procedure: OPEN REDUCTION INTERNAL FIXATION (ORIF) DISTAL RADIAL FRACTURE;   Surgeon: Schuyler Amor, MD;  Location: [facility];  Service: Orthopedics;  Laterality: Left;  LEFT DISTAL RADIUS OSTEOTOMY WITH BONE GRAFT   RIGHT/LEFT HEART CATH AND CORONARY ANGIOGRAPHY N/A 04/09/2021   Procedure: RIGHT/LEFT HEART CATH AND CORONARY ANGIOGRAPHY;  Surgeon: Jolaine Artist, MD;  Location: [facility];  Service: Cardiovascular;  Laterality: N/A;   TENDON REPAIR Left 02/07/2013   Procedure: LEFT EXTENSOR INDICIS PROPRIUS  TO EXTENSOR POLLICIS LONGUS TENDON TRANSFER;  Surgeon: Schuyler Amor, MD;  Location: [facility];  Service: Orthopedics;  Laterality: Left;   WISDOM TOOTH EXTRACTION     WRIST SURGERY     fx only    SOCIAL HISTORY: Social History   Socioeconomic History   Marital status: Single    Spouse name: Not on file   Number of children: Not on file   Years of education: Not on file   Highest education level: Not on file  Occupational History   Not on file  Tobacco Use   Smoking status: Former    Types: Cigars    Quit date: 10/11/2020    Years since quitting: 2.1   Smokeless tobacco: Never   Tobacco comments:    smokes 1 cigar a week  Vaping Use   Vaping Use: Never used  Substance and Sexual Activity   Alcohol use: No   Drug use: No   Sexual activity: Not on file  Other Topics Concern   Not on file  Social History Narrative   Not on file   Social Determinants of Health   Financial Resource Strain: High Risk (07/17/2021)   Overall Financial Resource Strain (CARDIA)    Difficulty of Paying Living Expenses: Very hard  Food Insecurity: Food Insecurity Present (04/10/2021)   Hunger Vital Sign    Worried About [place] in the Last Year: Sometimes true    Ran Out of Food in the Last Year: Sometimes true  Transportation Needs: No Transportation Needs (04/10/2021)   PRAPARE - Hydrologist (Medical): No    Lack of Transportation (Non-Medical): No  Physical Activity: Not on  file  Stress: Stress Concern Present (09/01/2021)   [place]    Feeling of Stress : Very much  Social Connections: Not on file  Intimate Partner Violence: Not on file    FAMILY HISTORY: Family History  Problem Relation Age of Onset   Heart attack Father    Sudden Cardiac Death Father 67   Diabetes Neg Hx    Hyperlipidemia Neg Hx    Hypertension Neg Hx     ALLERGIES:  is allergic to rituxan [rituximab] and hydrocodone.  MEDICATIONS:  Current Outpatient Medications  Medication Sig Dispense Refill   doxycycline (VIBRA-TABS) 100 MG tablet Take 1 tablet (100 mg total) by mouth 2 (two) times daily. 20 tablet 0   predniSONE (DELTASONE) 5 MG tablet Take 4 tablets (20 mg total) by mouth daily with breakfast for 5 days, THEN 2 tablets (10 mg total) daily with breakfast for 5 days, THEN 1 tablet (5 mg total) daily with breakfast for  5 days. 35 tablet 0   acetaminophen (TYLENOL) 500 MG tablet Take 500 mg by mouth every 6 (six) hours as needed.     albuterol (PROVENTIL HFA) 108 (90 Base) MCG/ACT inhaler Inhale 2 puffs into the lungs every 6 (six) hours as needed for wheezing or shortness of breath 6.7 g 2   carvedilol (COREG) 3.125 MG tablet Take 1 tablet by mouth 2 times daily. 60 tablet 6   dapagliflozin propanediol (FARXIGA) 10 MG TABS tablet Take 1 tablet (10 mg total) by mouth daily before breakfast. 30 tablet 11   diazepam (VALIUM) 5 MG tablet Take 1 tablet (5 mg total) by mouth at bedtime as needed for anxiety. 30 tablet 0   dronabinol (MARINOL) 5 MG capsule Take 1 capsule (5 mg total) by mouth 2 (two) times daily before a meal. 60 capsule 0   eplerenone (INSPRA) 25 MG tablet Take 0.5 tablets (12.5 mg total) by mouth daily. 15 tablet 3   ferrous sulfate 325 (65 FE) MG tablet Take 1 tablet by mouth daily with breakfast. Please take with a source of Vitamin C 90 tablet 3   furosemide (LASIX) 20 MG tablet Take 1 tablet (20 mg  total) by mouth daily as needed for swelling 100 tablet 2   ibrutinib (IMBRUVICA) 420 MG tablet Take 1 tablet (420 mg total) by mouth daily. 28 tablet 2   methadone (DOLOPHINE) 10 MG tablet Take 1 tablet by mouth every 8 hours 90 tablet 0   metoCLOPramide (REGLAN) 10 MG tablet Take 1 tablet (10 mg total) by mouth every 8 (eight) hours as needed for nausea. 30 tablet 1   naloxone (NARCAN) nasal spray 4 mg/0.1 mL Take for overdose 1 each 0   naloxone (NARCAN) nasal spray 4 mg/0.1 mL Take for overdose as directed 2 each 0   ondansetron (ZOFRAN) 8 MG tablet Take 1 tablet (8 mg total) by mouth every 8 (eight) hours as needed. 30 tablet 0   Oxycodone HCl 10 MG TABS Take 1 tablet by mouth every 4 hours as needed. 90 tablet 0   potassium chloride SA (KLOR-CON M) 20 MEQ tablet Take 1 tablet (20 mEq total) by mouth daily. 30 tablet 1   sacubitril-valsartan (ENTRESTO) 24-26 MG Take 1 tablet by mouth 2 (two) times daily. 180 tablet 3   No current facility-administered medications for this visit.    REVIEW OF SYSTEMS:   Constitutional: ( - ) fevers, ( - )  chills , ( - ) night sweats Eyes: ( - ) blurriness of vision, ( - ) double vision, ( - ) watery eyes Ears, nose, mouth, throat, and face: ( - ) mucositis, ( - ) sore throat Respiratory: ( - ) cough, ( - ) dyspnea, ( - ) wheezes Cardiovascular: ( - ) palpitation, ( - ) chest discomfort, ( - ) lower extremity swelling Gastrointestinal:  ( + ) nausea, ( - ) heartburn, ( - ) change in bowel habits Skin: ( - ) abnormal skin rashes Lymphatics: ( - ) new lymphadenopathy, ( - ) easy bruising Neurological: ( - ) numbness, ( - ) tingling, ( - ) new weaknesses Behavioral/Psych: ( - ) mood change, ( - ) new changes  All other systems were reviewed with the patient and are negative.  PHYSICAL EXAMINATION: ECOG PERFORMANCE STATUS: 1 - Symptomatic but completely ambulatory  There were no vitals filed for this visit.      There were no vitals filed for this  visit.  GENERAL: Chronically ill appearing middle-aged Caucasian male, alert, no distress and comfortable SKIN: Dry cracking rash of hands bilaterally, now affecting entire upper extremity bilaterally as well as back.  No issues on the chest, face, or lower extremities.  Skin color, texture, turgor are normal, no rashes or significant lesions.  EYES: conjunctiva are pink and non-injected, sclera clear LUNGS: clear to auscultation and percussion with normal breathing effort HEART: regular rate & rhythm and no murmurs and no lower extremity edema  PSYCH: alert & oriented x 3, fluent speech NEURO: no focal motor/sensory deficits   LABORATORY DATA:  I have reviewed the data as listed    Latest Ref Rng & Units 11/25/2022    3:04 PM 10/28/2022   11:10 AM 09/01/2022   10:02 AM  CBC  WBC 4.0 - 10.5 K/uL 9.3  8.7  6.5   Hemoglobin 13.0 - 17.0 g/dL 11.3  10.4  13.6   Hematocrit 39.0 - 52.0 % 35.2  30.8  41.5   Platelets 150 - 400 K/uL 303  282  285        Latest Ref Rng & Units 11/25/2022    3:04 PM 10/28/2022   11:10 AM 09/01/2022   10:02 AM  CMP  Glucose 70 - 99 mg/dL 98  94  106   BUN 8 - 23 mg/dL $Remove'10  10  13   'hhiOYwd$ Creatinine 0.61 - 1.24 mg/dL 0.70  0.87  1.01   Sodium 135 - 145 mmol/L 135  134  139   Potassium 3.5 - 5.1 mmol/L 3.8  3.8  4.0   Chloride 98 - 111 mmol/L 103  100  106   CO2 22 - 32 mmol/L $RemoveB'25  27  25   'EBDcuMht$ Calcium 8.9 - 10.3 mg/dL 8.4  8.2  9.0   Total Protein 6.5 - 8.1 g/dL 5.7  5.3  6.5   Total Bilirubin 0.3 - 1.2 mg/dL 0.4  0.4  0.8   Alkaline Phos 38 - 126 U/L 60  71  56   AST 15 - 41 U/L $Remo'11  13  18   'bYChy$ ALT 0 - 44 U/L $Remo'7  7  14     'wCmKQ$ Lab Results  Component Value Date   MPROTEIN 0.2 (H) 10/28/2022   MPROTEIN 0.1 (H) 09/01/2022   MPROTEIN 0.1 (H) 07/06/2022   Lab Results  Component Value Date   KPAFRELGTCHN 39.4 (H) 11/25/2022   KPAFRELGTCHN 60.3 (H) 10/28/2022   KPAFRELGTCHN 35.0 (H) 09/01/2022   LAMBDASER 8.7 11/25/2022   LAMBDASER 14.0 10/28/2022   LAMBDASER  3.8 (L) 09/01/2022   KAPLAMBRATIO 4.53 (H) 11/25/2022   KAPLAMBRATIO 4.31 (H) 10/28/2022   KAPLAMBRATIO 9.21 (H) 09/01/2022    RADIOGRAPHIC STUDIES: No results found.  ASSESSMENT & PLAN CRISTIANO CAPRI 62 y.o. male with medical history significant for Waldenstrom's macroglobulinemia who presents for a follow up visit.   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  There is no clear evidence of hyperviscosity syndrome at this time.  At this time we will plan to proceed with ibrutinib and rituximab therapy.  One could also consider Bendamustine and rituximab therapy but given the patient's degree of anemia I would prefer pursuing ibrutinib and rituximab at this time.  Additionally ibrutinib/rituximab are category 1 recommendations per the NCCN guidelines.  The treatment will consist of ibrutinib 420 mg p.o. daily with rituximab on weeks 1 through 4 as  well as weeks 27 through 30.  Previously we discussed the risks and benefits of therapy including (but not limited to) risk of bleeding, fatigue, atypical infections, and cardiac arrhythmias.  The patient voices understanding of this plan moving forward.   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 --Started second round of weekly rituximab on 12/02/2021. Continued weekly x 4 weeks.   --last rituximab dose held due to missed visits/illness.  Plan: --Labs today were reviewed without any intervention needed.  Labs show white cell count 9.3, hemoglobin 11.3, MCV 87.6, and platelets of 303.  Pending SPEP and sFLC  levels but prior levels appear stable. --continues on ibrutinib 420 mg PO daily.  --RTC in 4 weeks with labs   #Rash-worsening -- Affecting hands bilaterally, upper extremities bilaterally, and back.  Not affecting face, chest, or lower extremities. -- Patient has dryness and cracking as well as erythema on the upper extremities. -- Etiology is unclear.  Patient is using hydrating creams but is not effective.  Making referral to dermatology for evaluation.   # Hypokalemia-improving -- Potassium 3.8, continue to monitor   #Pain Management --Pain was poorly controlled on hydrocodone therapy previously.  --Patient was previously admitted for for opioid overdose with presumed fentanyl --Currently pain regimen includes methadone 10 mg TID per palliative care.  -- Appreciate recommendations of palliative care.  #Normocytic Anemia  --Hgb 11.3 --previously there was a component of iron deficiency anemia as well.  Currently on PO ferrous sulfate to try and bolster his iron levels.  --Continue to monitor.  #Nausea and vomiting--improving -- Concern that the nausea and vomiting may not be related to the ibrutinib therapy and potentially tied to poor gastric motility due to opioid prescription.  --Unable to tolerate olanzopine so discontinued --Not taking metoclopramide 10 mg every 8 hours  --continue zofran PRN.   #Supportive Care -- chemotherapy education complete -- port placement not required   No orders of the defined types were placed in this encounter.  All questions were answered. The patient knows to call the clinic with any problems, questions or concerns.  I have spent a total of 30 minutes minutes of face-to-face and non-face-to-face time, preparing to see the patient,  performing a medically appropriate examination, counseling and educating the patient, ordering medications/tests, referring and communicating with other health care professionals, documenting clinical information  in the electronic health record, and care coordination.   Ledell Peoples, MD Department of Hematology/Oncology [place] at [facility] Phone: [phone] Pager: [phone] Email: [email or web address]  11/28/2022 6:25 PM  Dimopoulos MA, Rennie Natter, [place], Mahe B, Herbaux C, Tam C, Orsucci L, [place] ML, Matous JV, [place] C, Kastritis E, [place], Li J, Salman Z, Graef T, Buske C; iNNOVATE Study Group and the [employer] for [employer]. Phase 3 Trial of Ibrutinib plus Rituximab in Waldenstrm's Macroglobulinemia. Alison Stalling J Med. 2018 Jun 21;378(25):2399-2410.   --At 30 months, the progression-free survival rate was 82% with ibrutinib-rituximab versus 28% with placebo-rituximab (hazard ratio for progression or death, 0.20; P<0.001).

## 2022-11-29 ENCOUNTER — Telehealth: Payer: Self-pay | Admitting: Hematology and Oncology

## 2022-11-29 ENCOUNTER — Other Ambulatory Visit: Payer: Self-pay

## 2022-11-29 ENCOUNTER — Other Ambulatory Visit (HOSPITAL_COMMUNITY): Payer: Self-pay

## 2022-11-29 NOTE — Telephone Encounter (Signed)
Per 2/16 IB reached out to patient to schedule; unable to reach patient

## 2022-12-01 ENCOUNTER — Other Ambulatory Visit (HOSPITAL_COMMUNITY): Payer: Self-pay

## 2022-12-01 LAB — MULTIPLE MYELOMA PANEL, SERUM
Albumin SerPl Elph-Mcnc: 3.2 g/dL (ref 2.9–4.4)
Albumin/Glob SerPl: 1.4 (ref 0.7–1.7)
Alpha 1: 0.3 g/dL (ref 0.0–0.4)
Alpha2 Glob SerPl Elph-Mcnc: 0.6 g/dL (ref 0.4–1.0)
B-Globulin SerPl Elph-Mcnc: 0.7 g/dL (ref 0.7–1.3)
Gamma Glob SerPl Elph-Mcnc: 0.9 g/dL (ref 0.4–1.8)
Globulin, Total: 2.4 g/dL (ref 2.2–3.9)
IgA: 29 mg/dL — ABNORMAL LOW (ref 61–437)
IgG (Immunoglobin G), Serum: 868 mg/dL (ref 603–1613)
IgM (Immunoglobulin M), Srm: 108 mg/dL (ref 20–172)
M Protein SerPl Elph-Mcnc: 0.2 g/dL — ABNORMAL HIGH
Total Protein ELP: 5.6 g/dL — ABNORMAL LOW (ref 6.0–8.5)

## 2022-12-03 ENCOUNTER — Telehealth: Payer: Self-pay

## 2022-12-03 ENCOUNTER — Other Ambulatory Visit (HOSPITAL_COMMUNITY): Payer: Self-pay

## 2022-12-03 NOTE — Telephone Encounter (Signed)
Patient's SO called requesting an appointment for evaluation of rash on arms and legs. Explained that [facility] NP has referred patient to [facility]. Provided the phone number to derm clinic for patient's SO to call on Monday 12/06/22.

## 2022-12-06 ENCOUNTER — Other Ambulatory Visit: Payer: Self-pay

## 2022-12-06 ENCOUNTER — Other Ambulatory Visit: Payer: Self-pay | Admitting: Nurse Practitioner

## 2022-12-06 ENCOUNTER — Other Ambulatory Visit (HOSPITAL_COMMUNITY): Payer: Self-pay

## 2022-12-06 ENCOUNTER — Telehealth: Payer: Self-pay | Admitting: Pharmacy Technician

## 2022-12-06 DIAGNOSIS — G893 Neoplasm related pain (acute) (chronic): Secondary | ICD-10-CM

## 2022-12-06 DIAGNOSIS — G4700 Insomnia, unspecified: Secondary | ICD-10-CM

## 2022-12-06 DIAGNOSIS — Z515 Encounter for palliative care: Secondary | ICD-10-CM

## 2022-12-06 DIAGNOSIS — C880 Waldenstrom macroglobulinemia: Secondary | ICD-10-CM

## 2022-12-06 MED ORDER — DIAZEPAM 5 MG PO TABS
5.0000 mg | ORAL_TABLET | Freq: Every evening | ORAL | 0 refills | Status: DC | PRN
  Filled 2022-12-06 – 2022-12-10 (×2): qty 30, 30d supply, fill #0

## 2022-12-06 MED ORDER — OXYCODONE HCL 10 MG PO TABS
10.0000 mg | ORAL_TABLET | ORAL | 0 refills | Status: DC | PRN
  Filled 2022-12-08: qty 90, 15d supply, fill #0

## 2022-12-06 NOTE — Telephone Encounter (Signed)
Oral Oncology Patient Advocate Encounter   Began application for assistance for Imbruvica through [facility] per patient request.   Application will be submitted upon completion of necessary supporting documentation.  Patient agreed to stop by the office on 12/07/22 to sign required forms   [employer] phone number [phone].   I will continue to check the status until final determination.   Lady Deutscher, CPhT-Adv Oncology Pharmacy Patient [place] Direct Number: [phone]  Fax: [phone]

## 2022-12-08 ENCOUNTER — Other Ambulatory Visit (HOSPITAL_COMMUNITY): Payer: Self-pay

## 2022-12-08 ENCOUNTER — Other Ambulatory Visit: Payer: Self-pay

## 2022-12-08 NOTE — Telephone Encounter (Signed)
Oral Oncology Patient Advocate Encounter   Submitted application for assistance for Imbruvica to [facility].   Application submitted via e-fax to [phone]   [employer] phone number [phone].   I will continue to check the status until final determination.   Lady Deutscher, CPhT-Adv Oncology Pharmacy Patient [place] Direct Number: [phone]  Fax: [phone]

## 2022-12-09 ENCOUNTER — Other Ambulatory Visit (HOSPITAL_COMMUNITY): Payer: Self-pay

## 2022-12-10 ENCOUNTER — Other Ambulatory Visit (HOSPITAL_COMMUNITY): Payer: Self-pay

## 2022-12-13 ENCOUNTER — Other Ambulatory Visit (HOSPITAL_COMMUNITY): Payer: Self-pay

## 2022-12-13 NOTE — Telephone Encounter (Signed)
Oral Oncology Patient Advocate Encounter  Called to check status of PAP.  Additional information showing grant funding closures and ineligibility requested by the program. This information has been sent to [facility] via e-fax [phone].  Connor Solomon, CPhT-Adv Oncology Pharmacy Patient [place] Direct Number: [phone]  Fax: [phone]

## 2022-12-19 ENCOUNTER — Other Ambulatory Visit: Payer: Self-pay | Admitting: Nurse Practitioner

## 2022-12-19 DIAGNOSIS — C880 Waldenstrom macroglobulinemia: Secondary | ICD-10-CM

## 2022-12-19 DIAGNOSIS — R634 Abnormal weight loss: Secondary | ICD-10-CM

## 2022-12-19 DIAGNOSIS — G893 Neoplasm related pain (acute) (chronic): Secondary | ICD-10-CM

## 2022-12-19 DIAGNOSIS — R630 Anorexia: Secondary | ICD-10-CM

## 2022-12-19 DIAGNOSIS — Z515 Encounter for palliative care: Secondary | ICD-10-CM

## 2022-12-20 ENCOUNTER — Other Ambulatory Visit: Payer: Self-pay

## 2022-12-20 ENCOUNTER — Other Ambulatory Visit (HOSPITAL_COMMUNITY): Payer: Self-pay

## 2022-12-20 MED ORDER — DRONABINOL 5 MG PO CAPS
5.0000 mg | ORAL_CAPSULE | Freq: Two times a day (BID) | ORAL | 0 refills | Status: DC
  Filled 2022-12-20 – 2022-12-30 (×2): qty 60, 30d supply, fill #0

## 2022-12-20 MED ORDER — METHADONE HCL 10 MG PO TABS
10.0000 mg | ORAL_TABLET | Freq: Three times a day (TID) | ORAL | 0 refills | Status: DC
  Filled 2022-12-28: qty 90, 30d supply, fill #0

## 2022-12-20 MED ORDER — OXYCODONE HCL 10 MG PO TABS
10.0000 mg | ORAL_TABLET | ORAL | 0 refills | Status: DC | PRN
  Filled 2022-12-23: qty 90, 15d supply, fill #0

## 2022-12-20 NOTE — Telephone Encounter (Signed)
Oral Oncology Patient Advocate Encounter   Received notification that the application for assistance for Imbruvica through [facility] has been approved.   [employer] phone number [phone].   Effective dates: 12/17/22 through 12/16/23  I have spoken to the patient.  Lady Deutscher, CPhT-Adv Oncology Pharmacy Patient [place] Direct Number: [phone]  Fax: [phone]

## 2022-12-21 ENCOUNTER — Other Ambulatory Visit: Payer: Self-pay | Admitting: Hematology and Oncology

## 2022-12-21 ENCOUNTER — Other Ambulatory Visit (HOSPITAL_COMMUNITY): Payer: Self-pay

## 2022-12-21 ENCOUNTER — Telehealth: Payer: Self-pay

## 2022-12-21 NOTE — Telephone Encounter (Signed)
This RN attempted to call pt back to discuss medication imbruvica per Lady Deutscher, CPhT, pt not available let a message with pt significant other.

## 2022-12-21 NOTE — Telephone Encounter (Signed)
Pt called reporting his dronabinol was more expensive, this RN spoke with pharmacy, social work, and financial services. This pt was previously using a grant and is out of funds and the medication is going through his insurance. Pt notified of this and voice understanding, pt voiced how difficult everything is regarding funds, RN provided an empathetic space for him to share his concerns. This RN to follow up with social work regarding any potential grants if they become available and per pt request pt information sent to chaplain services for pt to speak to , no further needs at this time.

## 2022-12-22 ENCOUNTER — Encounter: Payer: Self-pay | Admitting: General Practice

## 2022-12-22 NOTE — Progress Notes (Signed)
[place] Spiritual Care Note  Referred by nursing for additional layer of support. Reached Mr Factor by phone to introduce Spiritual Care as part of his support team. He reports significant financial distress, particularly related to medicaid eligibility/coverage and associated ability to pay for necessary treatments and medications. He also notes that he has spiritual questions that he would like to discuss in person.  Made Spiritual Care appointment for Wednesday 3/20 at 9:30, or immediately following his Palliative Care appointment.   [place], [place], [facility] Pager [phone] Voicemail [phone]

## 2022-12-23 ENCOUNTER — Other Ambulatory Visit (HOSPITAL_COMMUNITY): Payer: Self-pay

## 2022-12-23 ENCOUNTER — Telehealth: Payer: Self-pay

## 2022-12-23 ENCOUNTER — Other Ambulatory Visit: Payer: Self-pay

## 2022-12-23 NOTE — Telephone Encounter (Signed)
Pt called reporting he was having trouble refilling his oxycodone, RN called pharmacy and confirmed that pt can pick up medication today. Pt verbalized understanding.

## 2022-12-24 NOTE — Progress Notes (Deleted)
[place]  [phone] [phone]) [phone]   Name: Connor Solomon Date: 12/24/2022 MRN: [id number]  DOB: December 18, 1960  Patient Care Team: Delene Ruffini, MD as PCP - General (Internal Medicine) Pickenpack-Cousar, Carlena Sax, NP as Nurse Practitioner (Nurse Practitioner)   REASON FOR CONSULTATION: Connor Solomon is a 62 y.o. 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 Palliative ask to see for symptom management.  SOCIAL HISTORY:     reports that he quit smoking about 2 years ago. His smoking use included cigars. He has never used smokeless tobacco. He reports that he does not drink alcohol and does not use drugs.  ADVANCE DIRECTIVES:    CODE STATUS:   PAST MEDICAL HISTORY: Past Medical History:  Diagnosis Date   Acute heart failure ([place]) 04/08/2021   Acute HFrEF (heart failure with reduced ejection fraction) ([place]) 04/07/2021   Arthritis    hands, elbows bilaterally   Cancer ([facility])    Full dentures    Hypertension    Pt states he doen not have HTN and has never been treated for HTN   Kidney stone on left side last stone 4-5 yrs ago   recurrent (3 episodes)   Panic disorder    pt states has resolved   Syncope    resolved- was related to panic attacks    PAST SURGICAL HISTORY:  Past Surgical History:  Procedure Laterality Date   CARPAL METACARPAL FUSION WITH DISTAL RADIAL BONE GRAFT Left 05/30/2013   Procedure: LEFT THUMB METACARPAL JOINT FUSION;  Surgeon: Schuyler Amor, MD;  Location: [facility];  Service: Orthopedics;  Laterality: Left;   ESOPHAGOGASTRODUODENOSCOPY N/A 02/12/2014   Procedure: ESOPHAGOGASTRODUODENOSCOPY (EGD);  Surgeon: Jerene Bears, MD;  Location[facility] ENDOSCOPY;  Service: Endoscopy;  Laterality: N/A;   FOREIGN BODY REMOVAL N/A 02/12/2014   Procedure: FOREIGN BODY REMOVAL;  Surgeon: Jerene Bears,  MD;  Location: [facility];  Service: Endoscopy;  Laterality: N/A;   OPEN REDUCTION INTERNAL FIXATION (ORIF) DISTAL RADIAL FRACTURE Left 11/29/2012   Procedure: OPEN REDUCTION INTERNAL FIXATION (ORIF) DISTAL RADIAL FRACTURE;  Surgeon: Schuyler Amor, MD;  Location: [facility];  Service: Orthopedics;  Laterality: Left;  LEFT DISTAL RADIUS OSTEOTOMY WITH BONE GRAFT   RIGHT/LEFT HEART CATH AND CORONARY ANGIOGRAPHY N/A 04/09/2021   Procedure: RIGHT/LEFT HEART CATH AND CORONARY ANGIOGRAPHY;  Surgeon: Jolaine Artist, MD;  Location: [facility];  Service: Cardiovascular;  Laterality: N/A;   TENDON REPAIR Left 02/07/2013   Procedure: LEFT EXTENSOR INDICIS PROPRIUS  TO EXTENSOR POLLICIS LONGUS TENDON TRANSFER;  Surgeon: Schuyler Amor, MD;  Location: [facility];  Service: Orthopedics;  Laterality: Left;   WISDOM TOOTH EXTRACTION     WRIST SURGERY     fx only    HEMATOLOGY/ONCOLOGY HISTORY:  Oncology History  Waldenstrom macroglobulinemia ([place])  05/24/2021 Initial Diagnosis   Waldenstrom macroglobulinemia ([place])   06/05/2021 - 12/30/2021 Chemotherapy   Patient is on Treatment Plan : NON-HODGKINS LYMPHOMA Rituximab q7d       ALLERGIES:  is allergic to rituxan [rituximab] and hydrocodone.  MEDICATIONS:  Current Outpatient Medications  Medication Sig Dispense Refill   acetaminophen (TYLENOL) 500 MG tablet Take 500 mg by mouth every 6 (six) hours as needed.     albuterol (PROVENTIL HFA) 108 (90 Base) MCG/ACT inhaler Inhale 2 puffs into the lungs every 6 (six) hours as needed for wheezing  or shortness of breath 6.7 g 2   carvedilol (COREG) 3.125 MG tablet Take 1 tablet by mouth 2 times daily. 60 tablet 6   dapagliflozin propanediol (FARXIGA) 10 MG TABS tablet Take 1 tablet (10 mg total) by mouth daily before breakfast. 30 tablet 11   diazepam (VALIUM) 5 MG tablet Take 1 tablet (5 mg total) by mouth at bedtime as needed for anxiety. 30 tablet 0    doxycycline (VIBRA-TABS) 100 MG tablet Take 1 tablet (100 mg total) by mouth 2 (two) times daily. 20 tablet 0   dronabinol (MARINOL) 5 MG capsule Take 1 capsule (5 mg total) by mouth 2 (two) times daily before a meal. 60 capsule 0   eplerenone (INSPRA) 25 MG tablet Take 0.5 tablets (12.5 mg total) by mouth daily. 15 tablet 3   ferrous sulfate 325 (65 FE) MG tablet Take 1 tablet by mouth daily with breakfast. Please take with a source of Vitamin C 90 tablet 3   furosemide (LASIX) 20 MG tablet Take 1 tablet (20 mg total) by mouth daily as needed for swelling 100 tablet 2   ibrutinib (IMBRUVICA) 420 MG tablet Take 1 tablet (420 mg total) by mouth daily. 28 tablet 2   [START ON 12/28/2022] methadone (DOLOPHINE) 10 MG tablet Take 1 tablet by mouth every 8 hours 90 tablet 0   metoCLOPramide (REGLAN) 10 MG tablet Take 1 tablet (10 mg total) by mouth every 8 (eight) hours as needed for nausea. 30 tablet 1   naloxone (NARCAN) nasal spray 4 mg/0.1 mL Take for overdose 1 each 0   naloxone (NARCAN) nasal spray 4 mg/0.1 mL Take for overdose as directed 2 each 0   ondansetron (ZOFRAN) 8 MG tablet Take 1 tablet (8 mg total) by mouth every 8 (eight) hours as needed. 30 tablet 0   Oxycodone HCl 10 MG TABS Take 1 tablet by mouth every 4 hours as needed. 90 tablet 0   potassium chloride SA (KLOR-CON M) 20 MEQ tablet Take 1 tablet (20 mEq total) by mouth daily. 30 tablet 1   sacubitril-valsartan (ENTRESTO) 24-26 MG Take 1 tablet by mouth 2 (two) times daily. 180 tablet 3   No current facility-administered medications for this visit.    VITAL SIGNS: There were no vitals taken for this visit. There were no vitals filed for this visit.     Estimated body mass index is 20.66 kg/m as calculated from the following:   Height as of 09/01/22: 5\' 8"  (1.727 m).   Weight as of 11/25/22: 135 lb 14.4 oz (61.6 kg).   PERFORMANCE STATUS (ECOG) : 1 - Symptomatic but completely ambulatory   IMPRESSION:     Neoplasm  related pain Connor Solomon reports overall pain is well controlled. Some days are better than others.   We discussed regimen:Methadone 10mg  three times daily. Oxycodone 10mg  as needed for breakthrough pain. Taking as prescribed. Reill request within appropriate timing.   We will continue close monitoring and adjust as needed.   Constipation    Appetite/decreased appetite.    4. Anxiety/Anxiousness    5. Goals of care   (5/16) Connor Solomon shares his realistic understanding of his condition. He and his wife are making sure all "affairs" are in order as his biggest worry is leaving his wife in a financial constraint. Reports communicating with his life insurance policy etc. Emotional support provided.    He is emotional expressing racing thoughts during idol time or interruptions in his sleep as his mind wonders thinking  about worst case scenario. Is requesting something to assist with sleeping and anxiety. Acknowledged his request. Education provided on limited use in collaboration with other medications including zyprexa. He verbalized understanding and aware  we will continue to support and monitor for needs.      PLAN: Methadone 10 mg every 8 hours. Close monitoring. Tolerating well. Pain controlled. Oxycodone 10 mg as needed for breakthrough pain.  Miralax daily for constipation Valium 5mg  at bedtime. Zofran as needed for nausea as prescribed.  Marinol for appetite. Weight increasing.  Referral placed for [facility]. Office staff aware and will call patient to schedule appointment.  I will plan to see patient back in 4-6 weeks in collaboration to other oncology appointments.    Patient expressed understanding and was in agreement with this plan. He also understands that He can call the clinic at any time with any questions, concerns, or complaints.    Any controlled substances utilized were prescribed in the context of palliative care. PDMP has been reviewed.    Time Total: 20  min   Visit consisted of counseling and education dealing with the complex and emotionally intense issues of symptom management and palliative care in the setting of serious and potentially life-threatening illness.Greater than 50%  of this time was spent counseling and coordinating care related to the above assessment and plan.  Alda Lea, AGPCNP-BC  Palliative Medicine Team/[place]

## 2022-12-25 ENCOUNTER — Other Ambulatory Visit (HOSPITAL_COMMUNITY): Payer: Self-pay

## 2022-12-27 ENCOUNTER — Ambulatory Visit: Payer: Self-pay | Admitting: Dermatology

## 2022-12-28 ENCOUNTER — Other Ambulatory Visit: Payer: Self-pay

## 2022-12-28 ENCOUNTER — Other Ambulatory Visit (HOSPITAL_COMMUNITY): Payer: Self-pay

## 2022-12-29 ENCOUNTER — Inpatient Hospital Stay: Payer: Medicaid Other | Admitting: Nurse Practitioner

## 2022-12-29 ENCOUNTER — Telehealth: Payer: Self-pay | Admitting: *Deleted

## 2022-12-29 ENCOUNTER — Other Ambulatory Visit (HOSPITAL_COMMUNITY): Payer: Self-pay

## 2022-12-29 ENCOUNTER — Inpatient Hospital Stay: Payer: Medicaid Other | Admitting: General Practice

## 2022-12-29 ENCOUNTER — Inpatient Hospital Stay: Payer: Medicaid Other

## 2022-12-29 ENCOUNTER — Telehealth: Payer: Self-pay | Admitting: Hematology and Oncology

## 2022-12-29 ENCOUNTER — Inpatient Hospital Stay: Payer: Medicaid Other | Admitting: Hematology and Oncology

## 2022-12-29 NOTE — Telephone Encounter (Signed)
PC to patient regarding missed appointments today, he states he doesn't do well in the mornings & needs afternoon appointments.  Scheduling message sent to reschedule.

## 2022-12-29 NOTE — Telephone Encounter (Signed)
Called patient per 3/20 IB message to reschedule 3/20 appointments due to patient feeling ill. Patient rescheduled and notified.

## 2022-12-29 NOTE — Progress Notes (Unsigned)
Connor Solomon [phone]   [phone]  PROGRESS NOTE  Patient Care Team: Connor Ruffini, MD as PCP - General (Internal Medicine) Pickenpack-Cousar, Connor Sax, NP as Nurse Practitioner (Nurse Practitioner)  Hematological/Oncological History # Waldenstrom's Macroglobulinemia # IgM Monoclonal Gammopathy 04/07/2021: CT A/p showed mildly enlarged retroperitoneal lymph nodes and bilateral inguinal lymph nodes.  04/08/2021: IgM Kappa M protein 1.2 04/09/2021: WBC 11.1, Hgb 9.5, MCV 75.4, Plt 525 04/20/2021: Kappa 977, Lambda 7.8, K/L ratio 125.28. Serum viscosity 1.8 05/06/2021: Establish care with Dr. Lorenso Solomon 05/19/2021: Bone marrow biopsy performed showed hypercellular bone marrow involved by non-Hodgkin B-cell lymphoma, findings most consistent with Waldenstrom's macroglobulinemia 06/05/2021: Cycle 1 Day 1 of Ibrutinib + Rituximab. Infusion reaction with ritux, held halfway through.  06/12/2021: Cycle 2 Day 1 of Ibrutinib + Rituximab 06/19/2021: Cycle 3 Day 1 of Ibrutinib + Rituximab 06/26/2021: Cycle 4 Day 1 of rituximab. Continued on daily ibrutinib 12/02/2021: Cycle 5 Day 1 of Ibrutinib + Rituximab 12/09/2021: Cycle 6 Day 1 of Ibrutinib + Rituximab 12/16/2021: Cycle 7 Day 1 of Ibrutinib + Rituximab 12/23/2021: Held Cycle 8 of Ritxumab due to nausea/vomiting, elevated creatinine, finger infection  Interval History:  Connor Solomon 62 y.o. male with medical history significant for Waldenstrom's macroglobulinemia who presents for a follow up visit. The patient's last visit was on 09/01/2022. In the interim since the last visit he has continued on Ibrutinib therapy.   On exam today Connor Solomon reports he unfortunately has developed a severe rash up to his elbows bilaterally on both arms.  He reports that he started little bumps and they get quite erythematous and began peeling.  He reports that this is about the third time this has happened.  He has responded well in the  past to antibiotic therapy and steroids.  He notes that he does put hydrogen peroxide on his arms at night to try to help with this.  He notes it can be tender and painful.  Other than his rash everything else is okay.  His energy is "so-so".  He is having some bouts of fatigue and some days are better than others.  He reports he is not having any bleeding or bruising.  He denies any nausea, vomiting, or diarrhea and reports that it is improved from prior.  He denies fevers, chills, night sweats, shortness of breath, chest pain or cough. He has no other complaints. Full 10 point ROS is listed below.   MEDICAL HISTORY:  Past Medical History:  Diagnosis Date   Acute heart failure ([facility]) 04/08/2021   Acute HFrEF (heart failure with reduced ejection fraction) ([facility]) 04/07/2021   Arthritis    hands, elbows bilaterally   Cancer ([facility])    Full dentures    Hypertension    Pt states he doen not have HTN and has never been treated for HTN   Kidney stone on left side last stone 4-5 yrs ago   recurrent (3 episodes)   Panic disorder    pt states has resolved   Syncope    resolved- was related to panic attacks    SURGICAL HISTORY: Past Surgical History:  Procedure Laterality Date   CARPAL METACARPAL FUSION WITH DISTAL RADIAL BONE GRAFT Left 05/30/2013   Procedure: LEFT THUMB METACARPAL JOINT FUSION;  Surgeon: Connor Amor, MD;  Location: [facility];  Service: Orthopedics;  Laterality: Left;   ESOPHAGOGASTRODUODENOSCOPY N/A 02/12/2014   Procedure: ESOPHAGOGASTRODUODENOSCOPY (EGD);  Surgeon: Connor Bears, MD;  Location: [facility];  Service:  Endoscopy;  Laterality: N/A;   FOREIGN BODY REMOVAL N/A 02/12/2014   Procedure: FOREIGN BODY REMOVAL;  Surgeon: Connor Bears, MD;  Location: [facility];  Service: Endoscopy;  Laterality: N/A;   OPEN REDUCTION INTERNAL FIXATION (ORIF) DISTAL RADIAL FRACTURE Left 11/29/2012   Procedure: OPEN REDUCTION INTERNAL FIXATION (ORIF) DISTAL RADIAL FRACTURE;   Surgeon: Connor Amor, MD;  Location: [facility];  Service: Orthopedics;  Laterality: Left;  LEFT DISTAL RADIUS OSTEOTOMY WITH BONE GRAFT   RIGHT/LEFT HEART CATH AND CORONARY ANGIOGRAPHY N/A 04/09/2021   Procedure: RIGHT/LEFT HEART CATH AND CORONARY ANGIOGRAPHY;  Surgeon: Connor Artist, MD;  Location: [facility];  Service: Cardiovascular;  Laterality: N/A;   TENDON REPAIR Left 02/07/2013   Procedure: LEFT EXTENSOR INDICIS PROPRIUS  TO EXTENSOR POLLICIS LONGUS TENDON TRANSFER;  Surgeon: Connor Amor, MD;  Location: [facility];  Service: Orthopedics;  Laterality: Left;   WISDOM TOOTH EXTRACTION     WRIST SURGERY     fx only    SOCIAL HISTORY: Social History   Socioeconomic History   Marital status: Single    Spouse name: Not on file   Number of children: Not on file   Years of education: Not on file   Highest education level: Not on file  Occupational History   Not on file  Tobacco Use   Smoking status: Former    Types: Cigars    Quit date: 10/11/2020    Years since quitting: 2.2   Smokeless tobacco: Never   Tobacco comments:    smokes 1 cigar a week  Vaping Use   Vaping Use: Never used  Substance and Sexual Activity   Alcohol use: No   Drug use: No   Sexual activity: Not on file  Other Topics Concern   Not on file  Social History Narrative   Not on file   Social Determinants of Health   Financial Resource Strain: High Risk (07/17/2021)   Overall Financial Resource Strain (CARDIA)    Difficulty of Paying Living Expenses: Very hard  Food Insecurity: Food Insecurity Present (04/10/2021)   Hunger Vital Sign    Worried About [place] in the Last Year: Sometimes true    Ran Out of Food in the Last Year: Sometimes true  Transportation Needs: No Transportation Needs (04/10/2021)   PRAPARE - Hydrologist (Medical): No    Lack of Transportation (Non-Medical): No  Physical Activity: Not on  file  Stress: Stress Concern Present (09/01/2021)   [place]    Feeling of Stress : Very much  Social Connections: Not on file  Intimate Partner Violence: Not on file    FAMILY HISTORY: Family History  Problem Relation Age of Onset   Heart attack Father    Sudden Cardiac Death Father 7   Diabetes Neg Hx    Hyperlipidemia Neg Hx    Hypertension Neg Hx     ALLERGIES:  is allergic to rituxan [rituximab] and hydrocodone.  MEDICATIONS:  Current Outpatient Medications  Medication Sig Dispense Refill   acetaminophen (TYLENOL) 500 MG tablet Take 500 mg by mouth every 6 (six) hours as needed.     albuterol (PROVENTIL HFA) 108 (90 Base) MCG/ACT inhaler Inhale 2 puffs into the lungs every 6 (six) hours as needed for wheezing or shortness of breath 6.7 g 2   carvedilol (COREG) 3.125 MG tablet Take 1 tablet by mouth 2 times  daily. 60 tablet 6   dapagliflozin propanediol (FARXIGA) 10 MG TABS tablet Take 1 tablet (10 mg total) by mouth daily before breakfast. 30 tablet 11   diazepam (VALIUM) 5 MG tablet Take 1 tablet (5 mg total) by mouth at bedtime as needed for anxiety. 30 tablet 0   doxycycline (VIBRA-TABS) 100 MG tablet Take 1 tablet (100 mg total) by mouth 2 (two) times daily. 20 tablet 0   dronabinol (MARINOL) 5 MG capsule Take 1 capsule (5 mg total) by mouth 2 (two) times daily before a meal. 60 capsule 0   eplerenone (INSPRA) 25 MG tablet Take 0.5 tablets (12.5 mg total) by mouth daily. 15 tablet 3   ferrous sulfate 325 (65 FE) MG tablet Take 1 tablet by mouth daily with breakfast. Please take with a source of Vitamin C 90 tablet 3   furosemide (LASIX) 20 MG tablet Take 1 tablet (20 mg total) by mouth daily as needed for swelling 100 tablet 2   ibrutinib (IMBRUVICA) 420 MG tablet Take 1 tablet (420 mg total) by mouth daily. 28 tablet 2   methadone (DOLOPHINE) 10 MG tablet Take 1 tablet by mouth every 8 hours 90 tablet 0    metoCLOPramide (REGLAN) 10 MG tablet Take 1 tablet (10 mg total) by mouth every 8 (eight) hours as needed for nausea. 30 tablet 1   naloxone (NARCAN) nasal spray 4 mg/0.1 mL Take for overdose 1 each 0   naloxone (NARCAN) nasal spray 4 mg/0.1 mL Take for overdose as directed 2 each 0   ondansetron (ZOFRAN) 8 MG tablet Take 1 tablet (8 mg total) by mouth every 8 (eight) hours as needed. 30 tablet 0   Oxycodone HCl 10 MG TABS Take 1 tablet by mouth every 4 hours as needed. 90 tablet 0   potassium chloride SA (KLOR-CON M) 20 MEQ tablet Take 1 tablet (20 mEq total) by mouth daily. 30 tablet 1   sacubitril-valsartan (ENTRESTO) 24-26 MG Take 1 tablet by mouth 2 (two) times daily. 180 tablet 3   No current facility-administered medications for this visit.    REVIEW OF SYSTEMS:   Constitutional: ( - ) fevers, ( - )  chills , ( - ) night sweats Eyes: ( - ) blurriness of vision, ( - ) double vision, ( - ) watery eyes Ears, nose, mouth, throat, and face: ( - ) mucositis, ( - ) sore throat Respiratory: ( - ) cough, ( - ) dyspnea, ( - ) wheezes Cardiovascular: ( - ) palpitation, ( - ) chest discomfort, ( - ) lower extremity swelling Gastrointestinal:  ( + ) nausea, ( - ) heartburn, ( - ) change in bowel habits Skin: ( - ) abnormal skin rashes Lymphatics: ( - ) new lymphadenopathy, ( - ) easy bruising Neurological: ( - ) numbness, ( - ) tingling, ( - ) new weaknesses Behavioral/Psych: ( - ) mood change, ( - ) new changes  All other systems were reviewed with the patient and are negative.  PHYSICAL EXAMINATION: ECOG PERFORMANCE STATUS: 1 - Symptomatic but completely ambulatory  There were no vitals filed for this visit.      There were no vitals filed for this visit.     GENERAL: Chronically ill appearing middle-aged Caucasian male, alert, no distress and comfortable SKIN: Dry cracking rash of hands bilaterally, now affecting entire upper extremity bilaterally as well as back.  No issues on  the chest, face, or lower extremities.  Skin color, texture, turgor are normal, no  rashes or significant lesions.  EYES: conjunctiva are pink and non-injected, sclera clear LUNGS: clear to auscultation and percussion with normal breathing effort HEART: regular rate & rhythm and no murmurs and no lower extremity edema  PSYCH: alert & oriented x 3, fluent speech NEURO: no focal motor/sensory deficits   LABORATORY DATA:  I have reviewed the data as listed    Latest Ref Rng & Units 11/25/2022    3:04 PM 10/28/2022   11:10 AM 09/01/2022   10:02 AM  CBC  WBC 4.0 - 10.5 K/uL 9.3  8.7  6.5   Hemoglobin 13.0 - 17.0 g/dL 11.3  10.4  13.6   Hematocrit 39.0 - 52.0 % 35.2  30.8  41.5   Platelets 150 - 400 K/uL 303  282  285        Latest Ref Rng & Units 11/25/2022    3:04 PM 10/28/2022   11:10 AM 09/01/2022   10:02 AM  CMP  Glucose 70 - 99 mg/dL 98  94  106   BUN 8 - 23 mg/dL 10  10  13    Creatinine 0.61 - 1.24 mg/dL 0.70  0.87  1.01   Sodium 135 - 145 mmol/L 135  134  139   Potassium 3.5 - 5.1 mmol/L 3.8  3.8  4.0   Chloride 98 - 111 mmol/L 103  100  106   CO2 22 - 32 mmol/L 25  27  25    Calcium 8.9 - 10.3 mg/dL 8.4  8.2  9.0   Total Protein 6.5 - 8.1 g/dL 5.7  5.3  6.5   Total Bilirubin 0.3 - 1.2 mg/dL 0.4  0.4  0.8   Alkaline Phos 38 - 126 U/L 60  71  56   AST 15 - 41 U/L 11  13  18    ALT 0 - 44 U/L 7  7  14      Lab Results  Component Value Date   MPROTEIN 0.2 (H) 11/25/2022   MPROTEIN 0.2 (H) 10/28/2022   MPROTEIN 0.1 (H) 09/01/2022   Lab Results  Component Value Date   KPAFRELGTCHN 39.4 (H) 11/25/2022   KPAFRELGTCHN 60.3 (H) 10/28/2022   KPAFRELGTCHN 35.0 (H) 09/01/2022   LAMBDASER 8.7 11/25/2022   LAMBDASER 14.0 10/28/2022   LAMBDASER 3.8 (L) 09/01/2022   KAPLAMBRATIO 4.53 (H) 11/25/2022   KAPLAMBRATIO 4.31 (H) 10/28/2022   KAPLAMBRATIO 9.21 (H) 09/01/2022    RADIOGRAPHIC STUDIES: No results found.  ASSESSMENT & PLAN Connor Solomon 62 y.o. male with medical  history significant for Waldenstrom's macroglobulinemia who presents for a follow up visit.   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  There is no clear evidence of hyperviscosity syndrome at this time.  At this time we will plan to proceed with ibrutinib and rituximab therapy.  One could also consider Bendamustine and rituximab therapy but given the patient's degree of anemia I would prefer pursuing ibrutinib and rituximab at this time.  Additionally ibrutinib/rituximab are category 1 recommendations per the NCCN guidelines.  The treatment will consist of ibrutinib 420 mg p.o. daily with rituximab on weeks 1 through 4 as well as weeks 27 through 30.  Previously we discussed the risks and benefits of therapy including (but not limited to) risk of bleeding, fatigue, atypical infections, and cardiac arrhythmias.  The patient voices understanding of this plan moving forward.   #Waldenstrom's Macroglobulinemia/ Lymphoplasmacytic Lymphoma #  IgM Monoclonal Gammopathy -- Findings at this time are most consistent with Waldenstrom macroglobulinemia.  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 --Started second round of weekly rituximab on 12/02/2021. Continued weekly x 4 weeks.   --last rituximab dose held due to missed visits/illness.  Plan: --Labs today were reviewed without any intervention needed.  Labs show white cell count 9.3, hemoglobin 11.3, MCV 87.6, and platelets of 303.  Pending SPEP and sFLC levels but prior levels appear stable. --continues on ibrutinib 420 mg PO daily.  --RTC in 4 weeks with labs   #Rash-worsening -- Affecting hands bilaterally, upper extremities bilaterally, and back.  Not affecting face, chest, or  lower extremities. -- Patient has dryness and cracking as well as erythema on the upper extremities. -- Etiology is unclear.  Patient is using hydrating creams but is not effective.  Making referral to dermatology for evaluation.   # Hypokalemia-improving -- Potassium 3.8, continue to monitor   #Pain Management --Pain was poorly controlled on hydrocodone therapy previously.  --Patient was previously admitted for for opioid overdose with presumed fentanyl --Currently pain regimen includes methadone 10 mg TID per palliative care.  -- Appreciate recommendations of palliative care.  #Normocytic Anemia  --Hgb 11.3 --previously there was a component of iron deficiency anemia as well.  Currently on PO ferrous sulfate to try and bolster his iron levels.  --Continue to monitor.  #Nausea and vomiting--improving -- Concern that the nausea and vomiting may not be related to the ibrutinib therapy and potentially tied to poor gastric motility due to opioid prescription.  --Unable to tolerate olanzopine so discontinued --Not taking metoclopramide 10 mg every 8 hours  --continue zofran PRN.   #Supportive Care -- chemotherapy education complete -- port placement not required   No orders of the defined types were placed in this encounter.  All questions were answered. The patient knows to call the clinic with any problems, questions or concerns.  I have spent a total of 30 minutes minutes of face-to-face and non-face-to-face time, preparing to see the patient,  performing a medically appropriate examination, counseling and educating the patient, ordering medications/tests, referring and communicating with other health care professionals, documenting clinical information in the electronic health record, and care coordination.   Ledell Peoples, MD Department of Hematology/Oncology [facility] Phone: [phone] Pager: [phone] Email:  [email or web address]  12/29/2022 7:34 AM  Dimopoulos MA, Rennie Natter, [place], Mahe B, Herbaux C, Tam C, Orsucci L, [place] ML, Matous JV, [place] C, Kastritis E, [place], Li J, Salman Z, Graef T, Buske C; iNNOVATE Study Group and the [employer] for [employer]. Phase 3 Trial of Ibrutinib plus Rituximab in Waldenstrm's Macroglobulinemia. Alison Stalling J Med. 2018 Jun 21;378(25):2399-2410.   --At 30 months, the progression-free survival rate was 82% with ibrutinib-rituximab versus 28% with placebo-rituximab (hazard ratio for progression or death, 0.20; P<0.001).

## 2022-12-30 ENCOUNTER — Other Ambulatory Visit (HOSPITAL_COMMUNITY): Payer: Self-pay

## 2022-12-30 ENCOUNTER — Other Ambulatory Visit: Payer: Self-pay

## 2022-12-31 ENCOUNTER — Other Ambulatory Visit (HOSPITAL_COMMUNITY): Payer: Self-pay

## 2022-12-31 ENCOUNTER — Other Ambulatory Visit: Payer: Self-pay

## 2023-01-02 ENCOUNTER — Other Ambulatory Visit (HOSPITAL_COMMUNITY): Payer: Self-pay | Admitting: Family Medicine

## 2023-01-02 ENCOUNTER — Other Ambulatory Visit: Payer: Self-pay | Admitting: Nurse Practitioner

## 2023-01-02 DIAGNOSIS — C880 Waldenstrom macroglobulinemia: Secondary | ICD-10-CM

## 2023-01-02 DIAGNOSIS — G4700 Insomnia, unspecified: Secondary | ICD-10-CM

## 2023-01-02 DIAGNOSIS — G893 Neoplasm related pain (acute) (chronic): Secondary | ICD-10-CM

## 2023-01-02 DIAGNOSIS — Z515 Encounter for palliative care: Secondary | ICD-10-CM

## 2023-01-03 ENCOUNTER — Encounter: Payer: Self-pay | Admitting: General Practice

## 2023-01-03 ENCOUNTER — Other Ambulatory Visit (HOSPITAL_COMMUNITY): Payer: Self-pay

## 2023-01-03 ENCOUNTER — Other Ambulatory Visit: Payer: Self-pay

## 2023-01-03 MED ORDER — OXYCODONE HCL 10 MG PO TABS
10.0000 mg | ORAL_TABLET | ORAL | 0 refills | Status: DC | PRN
  Filled 2023-01-06: qty 90, 15d supply, fill #0

## 2023-01-03 MED ORDER — EPLERENONE 25 MG PO TABS
12.5000 mg | ORAL_TABLET | Freq: Every day | ORAL | 3 refills | Status: DC
  Filled 2023-01-03: qty 15, 30d supply, fill #0
  Filled 2023-02-19: qty 15, 30d supply, fill #1
  Filled 2023-03-13: qty 15, 30d supply, fill #2
  Filled 2023-04-26 – 2023-06-06 (×2): qty 15, 30d supply, fill #3

## 2023-01-03 MED ORDER — DIAZEPAM 5 MG PO TABS
5.0000 mg | ORAL_TABLET | Freq: Every evening | ORAL | 0 refills | Status: DC | PRN
  Filled 2023-01-03 – 2023-01-08 (×3): qty 30, 30d supply, fill #0

## 2023-01-03 NOTE — Progress Notes (Signed)
[facility] Spiritual Care Note  Due to a conflict with chaplain's schedule, left voicemail to cancel 01/10/2023 noon Spiritual Care appointment. Included direct Spiritual Care number with request to return call to reschedule.   [place], [place], [facility] Pager [phone] Voicemail [phone]

## 2023-01-05 ENCOUNTER — Other Ambulatory Visit (HOSPITAL_COMMUNITY): Payer: Self-pay

## 2023-01-05 ENCOUNTER — Ambulatory Visit (INDEPENDENT_AMBULATORY_CARE_PROVIDER_SITE_OTHER): Payer: Medicaid Other | Admitting: Dermatology

## 2023-01-05 ENCOUNTER — Telehealth: Payer: Self-pay | Admitting: Hematology and Oncology

## 2023-01-05 ENCOUNTER — Other Ambulatory Visit: Payer: Self-pay

## 2023-01-05 ENCOUNTER — Encounter: Payer: Self-pay | Admitting: Dermatology

## 2023-01-05 DIAGNOSIS — L309 Dermatitis, unspecified: Secondary | ICD-10-CM | POA: Diagnosis not present

## 2023-01-05 DIAGNOSIS — L299 Pruritus, unspecified: Secondary | ICD-10-CM | POA: Diagnosis not present

## 2023-01-05 MED ORDER — CLOBETASOL PROPIONATE 0.05 % EX CREA
1.0000 | TOPICAL_CREAM | Freq: Two times a day (BID) | CUTANEOUS | 2 refills | Status: DC
  Filled 2023-01-05: qty 30, 14d supply, fill #0
  Filled 2023-01-13: qty 30, 14d supply, fill #1
  Filled 2023-03-01 – 2023-03-13 (×2): qty 30, 14d supply, fill #2

## 2023-01-05 NOTE — Progress Notes (Signed)
New Patient Visit  Subjective  Connor Solomon is a 62 y.o. male who presents for the following: Eczema (Spots on hands and creases of elbows. Has had this problem for about a year. Has tried OTC cream and it helps a little bit. Today is a good day. Itches around the evening time. Hot water bothers it. Got dx of cancer a few years ago. Not sure if this is caused by chemo. Taking chemo pills everyday).    Objective  Well appearing patient in no apparent distress; mood and affect are within normal limits.  A focused examination was performed including hand and elbows. Relevant physical exam findings are noted in the Assessment and Plan.  Left Antecubital Fossa, Left Dorsal Hand, Right Antecubital Fossa, Right Dorsal Hand Ill-defined pink papules/plaques with scale-crust.    Left Antecubital Fossa, Left Forearm - Anterior, Left Hand - Posterior, Right Forearm - Anterior, Right Hand - Posterior Pt's exhibits pruritus and is actively itching during exam   Assessment & Plan  Eczema, unspecified type Left Antecubital Fossa; Right Antecubital Fossa; Left Dorsal Hand; Right Dorsal Hand  Skin crapping to rule out scabies was negative  Plan: Counseling I counseled the patient regarding the following: Eczema is a chronic, relapsing, remitting skin condition that has no cure but can be effectively treated Skin care: Patient should bathe using lukewarm water with a mild cleanser and moisturize immediately after. Emollients should be applied at least 2-3 times daily. Avoid scented detergents or fabric softeners. Keep fingernails short. Avoid excessive hand washing. Expectations: The patient is aware that eczema is chronic in nature and can improve with moisturizers and topical steroids and worsen with stress, scented soaps, detergents, scratching, dry skin, changes in weather and skin infections. Contact office if: Eczema worsens or fails to improve despite several weeks of treatment; patient  develops skin infections (such as: yellow honey colored crusts or cold sores).  I recommended the following: -Moisturizers -Topical corticosteroids  The following medication counseling was provided: I discussed with the patient that prolonged use of topical steroids can result in the increased appearance of superficial blood vessels (telangiectasias), lightening (hypopigmentation) and thinning of the skin (atrophy). Patient understands to avoid using high potency steroids in skin folds, the groin or the face. The patient verbalized understanding of the proper use and possible adverse effects of topical steroids. All of the patient's questions and concerns were addressed.   Related Medications clobetasol cream (TEMOVATE) [id number] % Apply 1 Application topically 2 (two) times daily. Apply topically to the arms and hands twice daily for up to two weeks  Pruritus (5) Left Antecubital Fossa; Left Hand - Posterior; Right Hand - Posterior; Left Forearm - Anterior; Right Forearm - Anterior  I counseled the patient regarding the following: Skin care: Recommend moisturizers, anti-histamines and sarna lotion. Expectations: Pruritus can be intermittent or persistent. Persistent cases can be due to dry skin, mite infestations, allergic reactions, neurodermatoses, or organic disease. For pruritus lasting longer than several months, CBC, TSH, LFTs, BUN/Crt, C-Xray may be warranted. Contact office if: Pruritus is accompanied by constitutional symptoms, or persists despite several months of treatment.  Treatment Plan:  Discussed OTC anti itch creams such as CeraVe anti-itch and treating with prescription corticosteroids for flares   Return in about 6 weeks (around 02/16/2023) for eczema follow up.  I, Zigmund Gottron, CMA, am acting as scribe for Ellard Artis, MD.  Documentation: I have reviewed the above documentation for accuracy and completeness, and I agree with the above  [place], DO

## 2023-01-05 NOTE — Patient Instructions (Signed)
Due to recent changes in healthcare laws, you may see results of your pathology and/or laboratory studies on MyChart before the doctors have had a chance to review them. We understand that in some cases there may be results that are confusing or concerning to you. Please understand that not all results are received at the same time and often the doctors may need to interpret multiple results in order to provide you with the best plan of care or course of treatment. Therefore, we ask that you please give [place] 2 business days to thoroughly review all your results before contacting the office for clarification. Should we see a critical lab result, you will be contacted sooner.   If You Need Anything After Your Visit  If you have any questions or concerns for your doctor, please call our main line at [phone] If no one answers, please leave a voicemail as directed and we will return your call as soon as possible. Messages left after 4 pm will be answered the following business day.   You may also send [place] a message via [place]. We typically respond to MyChart messages within 1-2 business days.  For prescription refills, please ask your pharmacy to contact our office. Our fax number is [phone].  If you have an urgent issue when the clinic is closed that cannot wait until the next business day, you can page your doctor at the number below.    Please note that while we do our best to be available for urgent issues outside of office hours, we are not available 24/7.   If you have an urgent issue and are unable to reach [place], you may choose to seek medical care at your doctor's office, retail clinic, urgent care center, or emergency room.  If you have a medical emergency, please immediately call 911 or go to the emergency department. In the event of inclement weather, please call our main line at [phone] for an update on the status of any delays or closures.  Dermatology Medication Tips: Please  keep the boxes that topical medications come in in order to help keep track of the instructions about where and how to use these. Pharmacies typically print the medication instructions only on the boxes and not directly on the medication tubes.   If your medication is too expensive, please contact our office at [phone] or send [place] a message through [place].   We are unable to tell what your co-pay for medications will be in advance as this is different depending on your insurance coverage. However, we may be able to find a substitute medication at lower cost or fill out paperwork to get insurance to cover a needed medication.   If a prior authorization is required to get your medication covered by your insurance company, please allow [place] 1-2 business days to complete this process.  Drug prices often vary depending on where the prescription is filled and some pharmacies may offer cheaper prices.  The website www.goodrx.com contains coupons for medications through different pharmacies. The prices here do not account for what the cost may be with help from insurance (it may be cheaper with your insurance), but the website can give you the price if you did not use any insurance.  - You can print the associated coupon and take it with your prescription to the pharmacy.  - You may also stop by our office during regular business hours and pick up a GoodRx coupon card.  - If you need your  prescription sent electronically to a different pharmacy, notify our office through [facility] or by phone at [phone]

## 2023-01-05 NOTE — Telephone Encounter (Signed)
Contacted patient to scheduled appointments. Left message with appointment details and a call back number if patient had any questions or could not accommodate the time we provided.   

## 2023-01-06 ENCOUNTER — Other Ambulatory Visit (HOSPITAL_COMMUNITY): Payer: Self-pay

## 2023-01-06 ENCOUNTER — Other Ambulatory Visit: Payer: Self-pay

## 2023-01-07 ENCOUNTER — Other Ambulatory Visit (HOSPITAL_COMMUNITY): Payer: Self-pay

## 2023-01-08 ENCOUNTER — Other Ambulatory Visit (HOSPITAL_COMMUNITY): Payer: Self-pay

## 2023-01-08 ENCOUNTER — Other Ambulatory Visit: Payer: Self-pay

## 2023-01-09 ENCOUNTER — Other Ambulatory Visit (HOSPITAL_COMMUNITY): Payer: Self-pay

## 2023-01-09 NOTE — Progress Notes (Unsigned)
[place] [phone]   [phone]  PROGRESS NOTE  Patient Care Team: Delene Ruffini, MD as PCP - General (Internal Medicine) Pickenpack-Cousar, Carlena Sax, NP as Nurse Practitioner (Nurse Practitioner)  Hematological/Oncological History # Waldenstrom's Macroglobulinemia # IgM Monoclonal Gammopathy 04/07/2021: CT A/p showed mildly enlarged retroperitoneal lymph nodes and bilateral inguinal lymph nodes.  04/08/2021: IgM Kappa M protein 1.2 04/09/2021: WBC 11.1, Hgb 9.5, MCV 75.4, Plt 525 04/20/2021: Kappa 977, Lambda 7.8, K/L ratio 125.28. Serum viscosity 1.8 05/06/2021: Establish care with Dr. 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 Infusion reaction with ritux, held halfway through.  06/12/2021: Cycle 2 Day 1 of Ibrutinib + Rituximab 06/19/2021: Cycle 3 Day 1 of Ibrutinib + Rituximab 06/26/2021: Cycle 4 Day 1 of rituximab. Continued on daily ibrutinib 12/02/2021: Cycle 5 Day 1 of Ibrutinib + Rituximab 12/09/2021: Cycle 6 Day 1 of Ibrutinib + Rituximab 12/16/2021: Cycle 7 Day 1 of Ibrutinib + Rituximab 12/23/2021: Held Cycle 8 of Ritxumab due to nausea/vomiting, elevated creatinine, finger infection  Interval History:  Connor Solomon 62 y.o. male with medical history significant for Waldenstrom's macroglobulinemia who presents for a follow up visit. The patient's last visit was on 11/25/22. In the interim since the last visit he has continued on Ibrutinib therapy.   On exam today Connor Solomon reports he did meet with a dermatologist who prescribed him a cream which has been working incredibly well for his rash.  He does still have some dry and cracked areas but for the most part he is not having nearly as much erythema and peeling.  He reports his next visit is in  approximately 6 weeks time.  It is thought that his rash may be related to his ibrutinib chemotherapy.  He notes that his pain is currently under good control.  He notes that he takes about 1-2 oxycodone per day and overall his pain levels are much more manageable.  He also reports his nausea has been improved, though he did have an episode of vomiting 3 days ago.  He takes Zofran when he feels nauseous and it has been helping.  He has not been having any issues with bleeding or palpitation though he does have some occasional chest discomfort.  Overall he is willing and able to proceed with ibrutinib therapy at this time.  He denies fevers, chills, night sweats, shortness of breath, chest pain or cough. He has no other complaints. Full 10 point ROS is listed below.   MEDICAL HISTORY:  Past Medical History:  Diagnosis Date   Acute heart failure 04/08/2021   Acute HFrEF (heart failure with reduced ejection fraction) 04/07/2021   Arthritis    hands, elbows bilaterally   Cancer    Full dentures    Hypertension    Pt states he doen not have HTN and has never been treated for HTN   Kidney stone on left side last stone 4-5 yrs ago   recurrent (3 episodes)   Panic disorder    pt states has resolved   Syncope    resolved- was related to panic attacks    SURGICAL HISTORY: Past Surgical History:  Procedure Laterality Date   CARPAL METACARPAL FUSION WITH DISTAL RADIAL BONE GRAFT Left 05/30/2013   Procedure: LEFT THUMB METACARPAL JOINT FUSION;  Surgeon: Schuyler Amor, MD;  Location: [place];  Service: Orthopedics;  Laterality: Left;   ESOPHAGOGASTRODUODENOSCOPY N/A 02/12/2014   Procedure: ESOPHAGOGASTRODUODENOSCOPY (EGD);  Surgeon: Jerene Bears, MD;  Location: [facility] ENDOSCOPY;  Service: Endoscopy;  Laterality: N/A;   FOREIGN BODY REMOVAL N/A 02/12/2014   Procedure: FOREIGN BODY REMOVAL;  Surgeon: Jerene Bears, MD;  Location: [place];  Service: Endoscopy;  Laterality: N/A;   OPEN  REDUCTION INTERNAL FIXATION (ORIF) DISTAL RADIAL FRACTURE Left 11/29/2012   Procedure: OPEN REDUCTION INTERNAL FIXATION (ORIF) DISTAL RADIAL FRACTURE;  Surgeon: Schuyler Amor, MD;  Location: [place];  Service: Orthopedics;  Laterality: Left;  LEFT DISTAL RADIUS OSTEOTOMY WITH BONE GRAFT   RIGHT/LEFT HEART CATH AND CORONARY ANGIOGRAPHY N/A 04/09/2021   Procedure: RIGHT/LEFT HEART CATH AND CORONARY ANGIOGRAPHY;  Surgeon: Jolaine Artist, MD;  Location: [place] [facility];  Service: Cardiovascular;  Laterality: N/A;   TENDON REPAIR Left 02/07/2013   Procedure: LEFT EXTENSOR INDICIS PROPRIUS  TO EXTENSOR POLLICIS LONGUS TENDON TRANSFER;  Surgeon: Schuyler Amor, MD;  Location: [place];  Service: Orthopedics;  Laterality: Left;   WISDOM TOOTH EXTRACTION     WRIST SURGERY     fx only    SOCIAL HISTORY: Social History   Socioeconomic History   Marital status: Single    Spouse name: Not on file   Number of children: Not on file   Years of education: Not on file   Highest education level: Not on file  Occupational History   Not on file  Tobacco Use   Smoking status: Former    Types: Cigars    Quit date: 10/11/2020    Years since quitting: 2.2   Smokeless tobacco: Never   Tobacco comments:    smokes 1 cigar a week  Vaping Use   Vaping Use: Never used  Substance and Sexual Activity   Alcohol use: No   Drug use: No   Sexual activity: Not on file  Other Topics Concern   Not on file  Social History Narrative   Not on file   Social Determinants of Health   Financial Resource Strain: High Risk (07/17/2021)   Overall Financial Resource Strain (CARDIA)    Difficulty of Paying Living Expenses: Very hard  Food Insecurity: Food Insecurity Present (04/10/2021)   Hunger Vital Sign    Worried About [place] in the Last Year: Sometimes true    Ran Out of Food in the Last Year: Sometimes true  Transportation Needs: No Transportation Needs  (04/10/2021)   PRAPARE - Hydrologist (Medical): No    Lack of Transportation (Non-Medical): No  Physical Activity: Not on file  Stress: Stress Concern Present (09/01/2021)   [place]    Feeling of Stress : Very much  Social Connections: Not on file  Intimate Partner Violence: Not on file    FAMILY HISTORY: Family History  Problem Relation Age of Onset   Heart attack Father    Sudden Cardiac Death Father 22   Diabetes Neg Hx    Hyperlipidemia Neg Hx    Hypertension Neg Hx     ALLERGIES:  is allergic to rituxan [rituximab] and hydrocodone.  MEDICATIONS:  Current Outpatient Medications  Medication Sig Dispense Refill   albuterol (PROVENTIL HFA) 108 (90 Base) MCG/ACT inhaler Inhale 2 puffs into the lungs every 6 (six) hours as needed for wheezing or shortness of breath 6.7 g 2   carvedilol (COREG) 3.125 MG tablet Take 1 tablet  by mouth 2 times daily. 60 tablet 6   clobetasol cream (TEMOVATE) [id number] % Apply 1 Application topically 2 (two) times daily. Apply topically to the arms and hands twice daily for up to two weeks 30 g 2   diazepam (VALIUM) 5 MG tablet Take 1 tablet (5 mg total) by mouth at bedtime as needed for anxiety. 30 tablet 0   dronabinol (MARINOL) 5 MG capsule Take 1 capsule (5 mg total) by mouth 2 (two) times daily before a meal. 60 capsule 0   eplerenone (INSPRA) 25 MG tablet Take 0.5 tablets (12.5 mg total) by mouth daily. 15 tablet 3   ferrous sulfate 325 (65 FE) MG tablet Take 1 tablet by mouth daily with breakfast. Please take with a source of Vitamin C 90 tablet 3   ibrutinib (IMBRUVICA) 420 MG tablet Take 1 tablet (420 mg total) by mouth daily. 28 tablet 2   methadone (DOLOPHINE) 10 MG tablet Take 1 tablet by mouth every 8 hours 90 tablet 0   ondansetron (ZOFRAN) 8 MG tablet Take 1 tablet (8 mg total) by mouth every 8 (eight) hours as needed. 30 tablet 0   Oxycodone HCl  10 MG TABS Take 1 tablet by mouth every 4 hours as needed. 90 tablet 0   potassium chloride SA (KLOR-CON M) 20 MEQ tablet Take 1 tablet (20 mEq total) by mouth daily. 30 tablet 1   acetaminophen (TYLENOL) 500 MG tablet Take 500 mg by mouth every 6 (six) hours as needed. (Patient not taking: Reported on 01/10/2023)     dapagliflozin propanediol (FARXIGA) 10 MG TABS tablet Take 1 tablet (10 mg total) by mouth daily before breakfast. 30 tablet 11   doxycycline (VIBRA-TABS) 100 MG tablet Take 1 tablet (100 mg total) by mouth 2 (two) times daily. (Patient not taking: Reported on 01/10/2023) 20 tablet 0   furosemide (LASIX) 20 MG tablet Take 1 tablet (20 mg total) by mouth daily as needed for swelling (Patient not taking: Reported on 01/10/2023) 100 tablet 2   metoCLOPramide (REGLAN) 10 MG tablet Take 1 tablet (10 mg total) by mouth every 8 (eight) hours as needed for nausea. (Patient not taking: Reported on 01/10/2023) 30 tablet 1   naloxone (NARCAN) nasal spray 4 mg/0.1 mL Take for overdose (Patient not taking: Reported on 01/10/2023) 1 each 0   naloxone (NARCAN) nasal spray 4 mg/0.1 mL Take for overdose as directed (Patient not taking: Reported on 01/10/2023) 2 each 0   sacubitril-valsartan (ENTRESTO) 24-26 MG Take 1 tablet by mouth 2 (two) times daily. 180 tablet 3   No current facility-administered medications for this visit.    REVIEW OF SYSTEMS:   Constitutional: ( - ) fevers, ( - )  chills , ( - ) night sweats Eyes: ( - ) blurriness of vision, ( - ) double vision, ( - ) watery eyes Ears, nose, mouth, throat, and face: ( - ) mucositis, ( - ) sore throat Respiratory: ( - ) cough, ( - ) dyspnea, ( - ) wheezes Cardiovascular: ( - ) palpitation, ( - ) chest discomfort, ( - ) lower extremity swelling Gastrointestinal:  ( + ) nausea, ( - ) heartburn, ( - ) change in bowel habits Skin: ( - ) abnormal skin rashes Lymphatics: ( - ) new lymphadenopathy, ( - ) easy bruising Neurological: ( - ) numbness, ( - )  tingling, ( - ) new weaknesses Behavioral/Psych: ( - ) mood change, ( - ) new changes  All other systems were  reviewed with the patient and are negative.  PHYSICAL EXAMINATION: ECOG PERFORMANCE STATUS: 1 - Symptomatic but completely ambulatory  Vitals:   01/10/23 1202  BP: (!) 145/83  Pulse: 85  Resp: 19  Temp: 97.7 F (36.5 C)  SpO2: 100%        Filed Weights   01/10/23 1202  Weight: 132 lb 3 oz (60 kg)       GENERAL: Chronically ill appearing middle-aged Caucasian male, alert, no distress and comfortable SKIN: Dry cracking rash of hands bilaterally, now affecting entire upper extremity bilaterally as well as back.  No issues on the chest, face, or lower extremities.  Skin color, texture, turgor are normal, no rashes or significant lesions.  EYES: conjunctiva are pink and non-injected, sclera clear LUNGS: clear to auscultation and percussion with normal breathing effort HEART: regular rate & rhythm and no murmurs and no lower extremity edema  PSYCH: alert & oriented x 3, fluent speech NEURO: no focal motor/sensory deficits   LABORATORY DATA:  I have reviewed the data as listed    Latest Ref Rng & Units 01/10/2023   11:39 AM 11/25/2022    3:04 PM 10/28/2022   11:10 AM  CBC  WBC 4.0 - 10.5 K/uL 5.8  9.3  8.7   Hemoglobin 13.0 - 17.0 g/dL 11.4  11.3  10.4   Hematocrit 39.0 - 52.0 % 34.1  35.2  30.8   Platelets 150 - 400 K/uL 235  303  282        Latest Ref Rng & Units 01/10/2023   11:39 AM 11/25/2022    3:04 PM 10/28/2022   11:10 AM  CMP  Glucose 70 - 99 mg/dL 115  98  94   BUN 8 - 23 mg/dL 13  10  10    Creatinine 0.61 - 1.24 mg/dL 0.82  0.70  0.87   Sodium 135 - 145 mmol/L 138  135  134   Potassium 3.5 - 5.1 mmol/L 3.4  3.8  3.8   Chloride 98 - 111 mmol/L 106  103  100   CO2 22 - 32 mmol/L 24  25  27    Calcium 8.9 - 10.3 mg/dL 8.9  8.4  8.2   Total Protein 6.5 - 8.1 g/dL 5.9  5.7  5.3   Total Bilirubin 0.3 - 1.2 mg/dL 0.4  0.4  0.4   Alkaline Phos 38 - 126  U/L 63  60  71   AST 15 - 41 U/L 13  11  13    ALT 0 - 44 U/L 8  7  7      Lab Results  Component Value Date   MPROTEIN 0.2 (H) 11/25/2022   MPROTEIN 0.2 (H) 10/28/2022   MPROTEIN 0.1 (H) 09/01/2022   Lab Results  Component Value Date   KPAFRELGTCHN 39.4 (H) 11/25/2022   KPAFRELGTCHN 60.3 (H) 10/28/2022   KPAFRELGTCHN 35.0 (H) 09/01/2022   LAMBDASER 8.7 11/25/2022   LAMBDASER 14.0 10/28/2022   LAMBDASER 3.8 (L) 09/01/2022   KAPLAMBRATIO 4.53 (H) 11/25/2022   KAPLAMBRATIO 4.31 (H) 10/28/2022   KAPLAMBRATIO 9.21 (H) 09/01/2022    RADIOGRAPHIC STUDIES: No results found.  ASSESSMENT & PLAN ATHA ELIZALDE 62 y.o. male with medical history significant for Waldenstrom's macroglobulinemia who presents for a follow up visit.   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  There is no clear evidence of hyperviscosity syndrome at this  time.  At this time we will plan to proceed with ibrutinib and rituximab therapy.  One could also consider Bendamustine and rituximab therapy but given the patient's degree of anemia I would prefer pursuing ibrutinib and rituximab at this time.  Additionally ibrutinib/rituximab are category 1 recommendations per the NCCN guidelines.  The treatment will consist of ibrutinib 420 mg p.o. daily with rituximab on weeks 1 through 4 as well as weeks 27 through 30.  Previously we discussed the risks and benefits of therapy including (but not limited to) risk of bleeding, fatigue, atypical infections, and cardiac arrhythmias.  The patient voices understanding of this plan moving forward.   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  This is confirmed with an IgM monoclonal gammopathy and bone marrow biopsy  results showing a lymphoplasmacytic lymphoma --No clear signs of hyperviscosity syndrome at this time. --Started second round of weekly rituximab on 12/02/2021. Continued weekly x 4 weeks.   --last rituximab dose held due to missed visits/illness.  Plan: --Labs today were reviewed without any intervention needed.  Labs show white cell count 5.8, Hgb 11.4, MCV 83.4, Plt 235.  Pending SPEP and sFLC levels but prior levels appear stable. --continues on ibrutinib 420 mg PO daily.  --RTC in 4 weeks with labs   #Rash-improved markedly -- Affecting hands bilaterally, upper extremities bilaterally, and back.  Not affecting face, chest, or lower extremities. -- Patient has dryness and cracking as well as erythema on the upper extremities. -- Etiology is unclear.  Patient is using hydrating creams but is not effective.  Made referral to dermatology for evaluation, he is established with Dr. Ellard Artis.  # Hypokalemia -- Potassium 3.4, continue to monitor   #Pain Management --Pain was poorly controlled on hydrocodone therapy previously.  --Patient was previously admitted for for opioid overdose with presumed fentanyl --Currently pain regimen includes methadone 10 mg TID per palliative care.  -- Appreciate recommendations of palliative care.  #Normocytic Anemia  --Hgb 11.4 --previously there was a component of iron deficiency anemia as well.  Currently on PO ferrous sulfate to try and bolster his iron levels.  --Continue to monitor.  #Nausea and vomiting--improving -- Concern that the nausea and vomiting may not be related to the ibrutinib therapy and potentially tied to poor gastric motility due to opioid prescription.  --Unable to tolerate olanzopine so discontinued --Not taking metoclopramide 10 mg every 8 hours  --continue zofran PRN.   #Supportive Care -- chemotherapy education complete -- port placement not required   No orders of the defined types were placed in this  encounter.  All questions were answered. The patient knows to call the clinic with any problems, questions or concerns.  I have spent a total of 30 minutes minutes of face-to-face and non-face-to-face time, preparing to see the patient,  performing a medically appropriate examination, counseling and educating the patient, ordering medications/tests, referring and communicating with other health care professionals, documenting clinical information in the electronic health record, and care coordination.   Connor Peoples, MD Department of Hematology/Oncology [place] at [facility] Phone: [phone] Pager: [phone] Email: [email or web address]  01/10/2023 2:18 PM  Dimopoulos MA, Rennie Natter, [place], Mahe B, Herbaux C, Tam C, Orsucci L, [place] ML, Matous JV, [place] C, Kastritis E, [place], Li J, Salman Z, Graef T, Buske C; iNNOVATE Study Group and the [employer] for [employer]  Macroglobulinemia. Phase 3 Trial of Ibrutinib plus Rituximab in Waldenstrm's Macroglobulinemia. Alison Stalling J Med. 2018 Jun 21;378(25):2399-2410.   --At 30 months, the progression-free survival rate was 82% with ibrutinib-rituximab versus 28% with placebo-rituximab (hazard ratio for progression or death, 0.20; P<0.001).

## 2023-01-10 ENCOUNTER — Inpatient Hospital Stay: Payer: Medicaid Other

## 2023-01-10 ENCOUNTER — Inpatient Hospital Stay (HOSPITAL_BASED_OUTPATIENT_CLINIC_OR_DEPARTMENT_OTHER): Payer: Medicaid Other | Admitting: Hematology and Oncology

## 2023-01-10 ENCOUNTER — Inpatient Hospital Stay: Payer: Medicaid Other | Attending: Hematology and Oncology | Admitting: Nurse Practitioner

## 2023-01-10 ENCOUNTER — Other Ambulatory Visit: Payer: Self-pay

## 2023-01-10 ENCOUNTER — Inpatient Hospital Stay: Payer: Medicaid Other | Admitting: General Practice

## 2023-01-10 ENCOUNTER — Encounter: Payer: Self-pay | Admitting: Nurse Practitioner

## 2023-01-10 ENCOUNTER — Inpatient Hospital Stay: Payer: Medicaid Other | Admitting: Hematology and Oncology

## 2023-01-10 VITALS — BP 145/83 | HR 85 | Temp 97.7°F | Resp 19 | Wt 132.2 lb

## 2023-01-10 DIAGNOSIS — R590 Localized enlarged lymph nodes: Secondary | ICD-10-CM | POA: Insufficient documentation

## 2023-01-10 DIAGNOSIS — R112 Nausea with vomiting, unspecified: Secondary | ICD-10-CM | POA: Diagnosis not present

## 2023-01-10 DIAGNOSIS — F419 Anxiety disorder, unspecified: Secondary | ICD-10-CM | POA: Diagnosis not present

## 2023-01-10 DIAGNOSIS — C880 Waldenstrom macroglobulinemia: Secondary | ICD-10-CM | POA: Diagnosis not present

## 2023-01-10 DIAGNOSIS — Z8249 Family history of ischemic heart disease and other diseases of the circulatory system: Secondary | ICD-10-CM | POA: Insufficient documentation

## 2023-01-10 DIAGNOSIS — I5022 Chronic systolic (congestive) heart failure: Secondary | ICD-10-CM | POA: Insufficient documentation

## 2023-01-10 DIAGNOSIS — E876 Hypokalemia: Secondary | ICD-10-CM | POA: Diagnosis not present

## 2023-01-10 DIAGNOSIS — Z419 Encounter for procedure for purposes other than remedying health state, unspecified: Secondary | ICD-10-CM | POA: Diagnosis not present

## 2023-01-10 DIAGNOSIS — D472 Monoclonal gammopathy: Secondary | ICD-10-CM | POA: Insufficient documentation

## 2023-01-10 DIAGNOSIS — G893 Neoplasm related pain (acute) (chronic): Secondary | ICD-10-CM | POA: Insufficient documentation

## 2023-01-10 DIAGNOSIS — Z885 Allergy status to narcotic agent status: Secondary | ICD-10-CM | POA: Insufficient documentation

## 2023-01-10 DIAGNOSIS — R634 Abnormal weight loss: Secondary | ICD-10-CM

## 2023-01-10 DIAGNOSIS — D649 Anemia, unspecified: Secondary | ICD-10-CM | POA: Insufficient documentation

## 2023-01-10 DIAGNOSIS — R21 Rash and other nonspecific skin eruption: Secondary | ICD-10-CM | POA: Insufficient documentation

## 2023-01-10 DIAGNOSIS — I110 Hypertensive heart disease with heart failure: Secondary | ICD-10-CM | POA: Diagnosis not present

## 2023-01-10 DIAGNOSIS — Z5986 Financial insecurity: Secondary | ICD-10-CM | POA: Diagnosis not present

## 2023-01-10 DIAGNOSIS — K5900 Constipation, unspecified: Secondary | ICD-10-CM | POA: Diagnosis not present

## 2023-01-10 DIAGNOSIS — Z79899 Other long term (current) drug therapy: Secondary | ICD-10-CM | POA: Diagnosis not present

## 2023-01-10 DIAGNOSIS — R110 Nausea: Secondary | ICD-10-CM

## 2023-01-10 DIAGNOSIS — R630 Anorexia: Secondary | ICD-10-CM | POA: Diagnosis not present

## 2023-01-10 DIAGNOSIS — Z515 Encounter for palliative care: Secondary | ICD-10-CM

## 2023-01-10 DIAGNOSIS — Z87891 Personal history of nicotine dependence: Secondary | ICD-10-CM | POA: Insufficient documentation

## 2023-01-10 LAB — CBC WITH DIFFERENTIAL (CANCER CENTER ONLY)
Abs Immature Granulocytes: 0.02 10*3/uL (ref 0.00–0.07)
Basophils Absolute: 0 10*3/uL (ref 0.0–0.1)
Basophils Relative: 1 %
Eosinophils Absolute: 0.8 10*3/uL — ABNORMAL HIGH (ref 0.0–0.5)
Eosinophils Relative: 13 %
HCT: 34.1 % — ABNORMAL LOW (ref 39.0–52.0)
Hemoglobin: 11.4 g/dL — ABNORMAL LOW (ref 13.0–17.0)
Immature Granulocytes: 0 %
Lymphocytes Relative: 27 %
Lymphs Abs: 1.6 10*3/uL (ref 0.7–4.0)
MCH: 27.9 pg (ref 26.0–34.0)
MCHC: 33.4 g/dL (ref 30.0–36.0)
MCV: 83.4 fL (ref 80.0–100.0)
Monocytes Absolute: 0.7 10*3/uL (ref 0.1–1.0)
Monocytes Relative: 12 %
Neutro Abs: 2.7 10*3/uL (ref 1.7–7.7)
Neutrophils Relative %: 47 %
Platelet Count: 235 10*3/uL (ref 150–400)
RBC: 4.09 MIL/uL — ABNORMAL LOW (ref 4.22–5.81)
RDW: 14.5 % (ref 11.5–15.5)
WBC Count: 5.8 10*3/uL (ref 4.0–10.5)
nRBC: 0 % (ref 0.0–0.2)

## 2023-01-10 LAB — CMP (CANCER CENTER ONLY)
ALT: 8 U/L (ref 0–44)
AST: 13 U/L — ABNORMAL LOW (ref 15–41)
Albumin: 3.6 g/dL (ref 3.5–5.0)
Alkaline Phosphatase: 63 U/L (ref 38–126)
Anion gap: 8 (ref 5–15)
BUN: 13 mg/dL (ref 8–23)
CO2: 24 mmol/L (ref 22–32)
Calcium: 8.9 mg/dL (ref 8.9–10.3)
Chloride: 106 mmol/L (ref 98–111)
Creatinine: 0.82 mg/dL (ref 0.61–1.24)
GFR, Estimated: >60 mL/min (ref >60)
Glucose, Bld: 115 mg/dL — ABNORMAL HIGH (ref 70–99)
Potassium: 3.4 mmol/L — ABNORMAL LOW (ref 3.5–5.1)
Sodium: 138 mmol/L (ref 135–145)
Total Bilirubin: 0.4 mg/dL (ref 0.3–1.2)
Total Protein: 5.9 g/dL — ABNORMAL LOW (ref 6.5–8.1)

## 2023-01-10 LAB — LACTATE DEHYDROGENASE: LDH: 143 U/L (ref 98–192)

## 2023-01-10 NOTE — Progress Notes (Signed)
[name]  Telephone:(336) [phone] Fax:(336) [phone]   Name: Connor Solomon Date: 01/10/2023 MRN: [id number]  DOB: 1961/10/10  Patient Care Team: Delene Ruffini, MD as PCP - General (Internal Medicine) Pickenpack-Cousar, Carlena Sax, NP as Nurse Practitioner (Nurse Practitioner)   REASON FOR CONSULTATION: Connor Solomon is a 62 y.o. 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 Palliative ask to see for symptom management.  SOCIAL HISTORY:     reports that he quit smoking about 2 years ago. His smoking use included cigars. He has never used smokeless tobacco. He reports that he does not drink alcohol and does not use drugs.  ADVANCE DIRECTIVES:    CODE STATUS:   PAST MEDICAL HISTORY: Past Medical History:  Diagnosis Date   Acute heart failure ([place]) 04/08/2021   Acute HFrEF (heart failure with reduced ejection fraction) ([place]) 04/07/2021   Arthritis    hands, elbows bilaterally   Cancer (HCC)    Full dentures    Hypertension    Pt states he doen not have HTN and has never been treated for HTN   Kidney stone on left side last stone 4-5 yrs ago   recurrent (3 episodes)   Panic disorder    pt states has resolved   Syncope    resolved- was related to panic attacks    PAST SURGICAL HISTORY:  Past Surgical History:  Procedure Laterality Date   CARPAL METACARPAL FUSION WITH DISTAL RADIAL BONE GRAFT Left 05/30/2013   Procedure: LEFT THUMB METACARPAL JOINT FUSION;  Surgeon: Schuyler Amor, MD;  Location: [place];  Service: Orthopedics;  Laterality: Left;   ESOPHAGOGASTRODUODENOSCOPY N/A 02/12/2014   Procedure: ESOPHAGOGASTRODUODENOSCOPY (EGD);  Surgeon: Jerene Bears, MD;  Location: [facility] ENDOSCOPY;  Service: Endoscopy;  Laterality: N/A;   FOREIGN BODY REMOVAL N/A 02/12/2014   Procedure: FOREIGN BODY REMOVAL;  Surgeon: Jerene Bears,  MD;  Location: [place];  Service: Endoscopy;  Laterality: N/A;   OPEN REDUCTION INTERNAL FIXATION (ORIF) DISTAL RADIAL FRACTURE Left 11/29/2012   Procedure: OPEN REDUCTION INTERNAL FIXATION (ORIF) DISTAL RADIAL FRACTURE;  Surgeon: Schuyler Amor, MD;  Location: [place];  Service: Orthopedics;  Laterality: Left;  LEFT DISTAL RADIUS OSTEOTOMY WITH BONE GRAFT   RIGHT/LEFT HEART CATH AND CORONARY ANGIOGRAPHY N/A 04/09/2021   Procedure: RIGHT/LEFT HEART CATH AND CORONARY ANGIOGRAPHY;  Surgeon: Jolaine Artist, MD;  Location: [place] [facility];  Service: Cardiovascular;  Laterality: N/A;   TENDON REPAIR Left 02/07/2013   Procedure: LEFT EXTENSOR INDICIS PROPRIUS  TO EXTENSOR POLLICIS LONGUS TENDON TRANSFER;  Surgeon: Schuyler Amor, MD;  Location: [place];  Service: Orthopedics;  Laterality: Left;   WISDOM TOOTH EXTRACTION     WRIST SURGERY     fx only    HEMATOLOGY/ONCOLOGY HISTORY:  Oncology History  Waldenstrom macroglobulinemia  05/24/2021 Initial Diagnosis   Waldenstrom macroglobulinemia ([place])   06/05/2021 - 12/30/2021 Chemotherapy   Patient is on Treatment Plan : NON-HODGKINS LYMPHOMA Rituximab q7d       ALLERGIES:  is allergic to rituxan [rituximab] and hydrocodone.  MEDICATIONS:  Current Outpatient Medications  Medication Sig Dispense Refill   acetaminophen (TYLENOL) 500 MG tablet Take 500 mg by mouth every 6 (six) hours as needed.     albuterol (PROVENTIL HFA) 108 (90 Base) MCG/ACT inhaler Inhale 2 puffs into the lungs every 6 (six) hours as needed for wheezing or  shortness of breath 6.7 g 2   carvedilol (COREG) 3.125 MG tablet Take 1 tablet by mouth 2 times daily. 60 tablet 6   clobetasol cream (TEMOVATE) [id number] % Apply 1 Application topically 2 (two) times daily. Apply topically to the arms and hands twice daily for up to two weeks 30 g 2   dapagliflozin propanediol (FARXIGA) 10 MG TABS tablet Take 1 tablet (10 mg total) by mouth  daily before breakfast. 30 tablet 11   diazepam (VALIUM) 5 MG tablet Take 1 tablet (5 mg total) by mouth at bedtime as needed for anxiety. 30 tablet 0   doxycycline (VIBRA-TABS) 100 MG tablet Take 1 tablet (100 mg total) by mouth 2 (two) times daily. 20 tablet 0   dronabinol (MARINOL) 5 MG capsule Take 1 capsule (5 mg total) by mouth 2 (two) times daily before a meal. 60 capsule 0   eplerenone (INSPRA) 25 MG tablet Take 0.5 tablets (12.5 mg total) by mouth daily. 15 tablet 3   ferrous sulfate 325 (65 FE) MG tablet Take 1 tablet by mouth daily with breakfast. Please take with a source of Vitamin C 90 tablet 3   furosemide (LASIX) 20 MG tablet Take 1 tablet (20 mg total) by mouth daily as needed for swelling 100 tablet 2   ibrutinib (IMBRUVICA) 420 MG tablet Take 1 tablet (420 mg total) by mouth daily. 28 tablet 2   methadone (DOLOPHINE) 10 MG tablet Take 1 tablet by mouth every 8 hours 90 tablet 0   metoCLOPramide (REGLAN) 10 MG tablet Take 1 tablet (10 mg total) by mouth every 8 (eight) hours as needed for nausea. 30 tablet 1   naloxone (NARCAN) nasal spray 4 mg/0.1 mL Take for overdose 1 each 0   naloxone (NARCAN) nasal spray 4 mg/0.1 mL Take for overdose as directed 2 each 0   ondansetron (ZOFRAN) 8 MG tablet Take 1 tablet (8 mg total) by mouth every 8 (eight) hours as needed. 30 tablet 0   Oxycodone HCl 10 MG TABS Take 1 tablet by mouth every 4 hours as needed. 90 tablet 0   potassium chloride SA (KLOR-CON M) 20 MEQ tablet Take 1 tablet (20 mEq total) by mouth daily. 30 tablet 1   sacubitril-valsartan (ENTRESTO) 24-26 MG Take 1 tablet by mouth 2 (two) times daily. 180 tablet 3   No current facility-administered medications for this visit.    VITAL SIGNS: There were no vitals taken for this visit. There were no vitals filed for this visit.     Estimated body mass index is 20.66 kg/m as calculated from the following:   Height as of 09/01/22: 5\' 8"  (1.727 m).   Weight as of 11/25/22: 135  lb 14.4 oz (61.6 kg).   PERFORMANCE STATUS (ECOG) : 1 - Symptomatic but completely ambulatory  Assessment NAD RRR Normal breathing pattern  Right upper arm skin rash improving  IMPRESSION: Connor Solomon presents to clinic today for symptom management follow-up. No acute distress. No family present. He is doing well overall. States his appreciation of "feeling good" the past 2-3 days. Denies nausea, vomiting, constipation, or diarrhea.  He was seen by dermatologist who prescribed skin cream for his contact dermatitis/eczema.  Feels that this has provided some relief and improvement.    Neoplasm related pain Brenyn reports overall pain is well controlled. Some days are better than others.   We discussed regimen:Methadone 10mg  three times daily. Oxycodone 10mg  as needed for breakthrough pain. Taking as prescribed. Reill request within appropriate  timing.   We will continue close monitoring and adjust as needed.  No changes to current regimen at this time.  Constipation Controlled with diet and daily MiraLAX.  Appetite/decreased appetite.  Jaiveer expresses his appreciation of re-enrollment of his Medicaid allow him to be able to get necessary medications.  His appetite was good when taking his Marinol however he states a noticeable difference when he was not taking.  He was unable to pick up medication due to the cost and unable to afford out-of-pocket.  He has not been able to pick up his Marinol which was covered under Medicaid and has restarted.  His weight decreased to 132 pounds from 135 pounds on 2/15, 126 pounds on 1/18.  He is hopeful to see ongoing improvement now that he is back on his Marinol.  4. Anxiety/Anxiousness  Anxiety is controlled with medications.  He is taking his Valium daily.  Some recent concerns with life stressors.  He is able to take Valium twice daily as needed.  5. Goals of care   (5/16) Mr. Ryba shares his realistic understanding of his condition. He and his  wife are making sure all "affairs" are in order as his biggest worry is leaving his wife in a financial constraint. Reports communicating with his life insurance policy etc. Emotional support provided.    He is emotional expressing racing thoughts during idol time or interruptions in his sleep as his mind wonders thinking about worst case scenario. Is requesting something to assist with sleeping and anxiety. Acknowledged his request. Education provided on limited use in collaboration with other medications including zyprexa. He verbalized understanding and aware  we will continue to support and monitor for needs.      PLAN: Methadone 10 mg every 8 hours. Close monitoring. Tolerating well. Pain controlled. Oxycodone 10 mg as needed for breakthrough pain.  Miralax daily for constipation Valium 5mg  at twice daily. Zofran as needed for nausea as prescribed.  Marinol for appetite.  He was seen by dermatologist and prescribed topical skin creams which she reports is working. I will plan to see patient back in 4-6 weeks in collaboration to other oncology appointments.    Patient expressed understanding and was in agreement with this plan. He also understands that He can call the clinic at any time with any questions, concerns, or complaints.    Any controlled substances utilized were prescribed in the context of palliative care. PDMP has been reviewed.    Visit consisted of counseling and education dealing with the complex and emotionally intense issues of symptom management and palliative care in the setting of serious and potentially life-threatening illness.Greater than 50%  of this time was spent counseling and coordinating care related to the above assessment and plan.  Alda Lea, AGPCNP-BC  Palliative Medicine Team/[facility]  *Please note that this is a verbal dictation therefore any spelling or grammatical errors are due to the "[employer]" system  interpretation.

## 2023-01-11 ENCOUNTER — Encounter: Payer: Self-pay | Admitting: General Practice

## 2023-01-11 ENCOUNTER — Other Ambulatory Visit: Payer: Self-pay

## 2023-01-11 LAB — KAPPA/LAMBDA LIGHT CHAINS
Kappa free light chain: 33.9 mg/L — ABNORMAL HIGH (ref 3.3–19.4)
Kappa, lambda light chain ratio: 5.75 — ABNORMAL HIGH (ref 0.26–1.65)
Lambda free light chains: 5.9 mg/L (ref 5.7–26.3)

## 2023-01-11 NOTE — Progress Notes (Signed)
[facility] Spiritual Care Note  Followed up with Connor Solomon by phone, but he was sick to his stomach. We plan to follow up tomorrow instead.   [place], [place], [facility] Pager [phone] Voicemail [phone]

## 2023-01-12 ENCOUNTER — Encounter: Payer: Self-pay | Admitting: General Practice

## 2023-01-12 NOTE — Progress Notes (Signed)
[facility] Spiritual Care Note  Reached Connor Solomon by phone, scheduling an in-person conversation for spiritual/emotional support on Monday, April 15 at 10:30.   [place], [place], [facility] Pager [phone] Voicemail [phone]

## 2023-01-13 ENCOUNTER — Other Ambulatory Visit (HOSPITAL_COMMUNITY): Payer: Self-pay | Admitting: Family Medicine

## 2023-01-13 ENCOUNTER — Other Ambulatory Visit (HOSPITAL_COMMUNITY): Payer: Self-pay

## 2023-01-13 LAB — MULTIPLE MYELOMA PANEL, SERUM
Albumin SerPl Elph-Mcnc: 3.3 g/dL (ref 2.9–4.4)
Albumin/Glob SerPl: 1.5 (ref 0.7–1.7)
Alpha 1: 0.2 g/dL (ref 0.0–0.4)
Alpha2 Glob SerPl Elph-Mcnc: 0.6 g/dL (ref 0.4–1.0)
B-Globulin SerPl Elph-Mcnc: 0.8 g/dL (ref 0.7–1.3)
Gamma Glob SerPl Elph-Mcnc: 0.6 g/dL (ref 0.4–1.8)
Globulin, Total: 2.3 g/dL (ref 2.2–3.9)
IgA: 22 mg/dL — ABNORMAL LOW (ref 61–437)
IgG (Immunoglobin G), Serum: 560 mg/dL — ABNORMAL LOW (ref 603–1613)
IgM (Immunoglobulin M), Srm: 103 mg/dL (ref 20–172)
M Protein SerPl Elph-Mcnc: 0.2 g/dL — ABNORMAL HIGH
Total Protein ELP: 5.6 g/dL — ABNORMAL LOW (ref 6.0–8.5)

## 2023-01-13 MED ORDER — CARVEDILOL 3.125 MG PO TABS
3.1250 mg | ORAL_TABLET | Freq: Two times a day (BID) | ORAL | 6 refills | Status: DC
  Filled 2023-01-13: qty 60, 30d supply, fill #0
  Filled 2023-03-13 – 2023-03-16 (×2): qty 60, 30d supply, fill #1
  Filled 2023-04-26 – 2023-05-12 (×3): qty 60, 30d supply, fill #2
  Filled 2023-06-06: qty 60, 30d supply, fill #3
  Filled 2023-06-30: qty 60, 30d supply, fill #4
  Filled 2023-08-08: qty 60, 30d supply, fill #5
  Filled 2023-09-13 – 2023-10-06 (×2): qty 60, 30d supply, fill #6

## 2023-01-15 ENCOUNTER — Other Ambulatory Visit (HOSPITAL_COMMUNITY): Payer: Self-pay

## 2023-01-15 ENCOUNTER — Other Ambulatory Visit: Payer: Self-pay | Admitting: Nurse Practitioner

## 2023-01-15 DIAGNOSIS — C880 Waldenstrom macroglobulinemia: Secondary | ICD-10-CM

## 2023-01-15 DIAGNOSIS — Z515 Encounter for palliative care: Secondary | ICD-10-CM

## 2023-01-15 DIAGNOSIS — G893 Neoplasm related pain (acute) (chronic): Secondary | ICD-10-CM

## 2023-01-17 ENCOUNTER — Other Ambulatory Visit (HOSPITAL_COMMUNITY): Payer: Self-pay

## 2023-01-17 MED ORDER — OXYCODONE HCL 10 MG PO TABS
10.0000 mg | ORAL_TABLET | ORAL | 0 refills | Status: DC | PRN
Start: 2023-01-17 — End: 2023-02-01
  Filled 2023-01-17: qty 90, 15d supply, fill #0

## 2023-01-18 ENCOUNTER — Other Ambulatory Visit (HOSPITAL_COMMUNITY): Payer: Self-pay

## 2023-01-24 ENCOUNTER — Encounter: Payer: Self-pay | Admitting: General Practice

## 2023-01-24 ENCOUNTER — Inpatient Hospital Stay: Payer: Medicaid Other | Admitting: General Practice

## 2023-01-24 ENCOUNTER — Ambulatory Visit: Payer: Self-pay | Admitting: Hematology and Oncology

## 2023-01-24 ENCOUNTER — Other Ambulatory Visit: Payer: Self-pay

## 2023-01-24 NOTE — Progress Notes (Signed)
[facility] Spiritual Care Note  No show for 10:30 appt today; left message encouraging call to reschedule as desired.   [address] [place], [place], [facility] Pager [phone] Voicemail [phone]

## 2023-01-25 ENCOUNTER — Other Ambulatory Visit: Payer: Self-pay | Admitting: Nurse Practitioner

## 2023-01-25 DIAGNOSIS — C880 Waldenstrom macroglobulinemia: Secondary | ICD-10-CM

## 2023-01-25 DIAGNOSIS — Z515 Encounter for palliative care: Secondary | ICD-10-CM

## 2023-01-25 DIAGNOSIS — G4700 Insomnia, unspecified: Secondary | ICD-10-CM

## 2023-01-25 DIAGNOSIS — G893 Neoplasm related pain (acute) (chronic): Secondary | ICD-10-CM

## 2023-01-26 ENCOUNTER — Other Ambulatory Visit (HOSPITAL_COMMUNITY): Payer: Self-pay

## 2023-01-26 ENCOUNTER — Other Ambulatory Visit: Payer: Self-pay

## 2023-01-26 MED ORDER — METHADONE HCL 10 MG PO TABS
10.0000 mg | ORAL_TABLET | Freq: Three times a day (TID) | ORAL | 0 refills | Status: DC
Start: 2023-01-28 — End: 2023-02-19
  Filled 2023-01-28: qty 90, 30d supply, fill #0

## 2023-01-26 MED ORDER — DIAZEPAM 5 MG PO TABS
5.0000 mg | ORAL_TABLET | Freq: Two times a day (BID) | ORAL | 0 refills | Status: DC | PRN
  Filled 2023-01-26 (×4): qty 60, 30d supply, fill #0

## 2023-01-28 ENCOUNTER — Other Ambulatory Visit (HOSPITAL_COMMUNITY): Payer: Self-pay

## 2023-02-01 ENCOUNTER — Other Ambulatory Visit (HOSPITAL_COMMUNITY): Payer: Self-pay

## 2023-02-01 ENCOUNTER — Other Ambulatory Visit: Payer: Self-pay | Admitting: Nurse Practitioner

## 2023-02-01 DIAGNOSIS — C8800 Waldenstrom macroglobulinemia not having achieved remission: Secondary | ICD-10-CM

## 2023-02-01 DIAGNOSIS — G893 Neoplasm related pain (acute) (chronic): Secondary | ICD-10-CM

## 2023-02-01 DIAGNOSIS — Z515 Encounter for palliative care: Secondary | ICD-10-CM

## 2023-02-01 MED ORDER — OXYCODONE HCL 10 MG PO TABS
10.0000 mg | ORAL_TABLET | ORAL | 0 refills | Status: DC | PRN
Start: 2023-02-01 — End: 2023-02-19
  Filled 2023-02-01: qty 90, 15d supply, fill #0

## 2023-02-09 DIAGNOSIS — Z419 Encounter for procedure for purposes other than remedying health state, unspecified: Secondary | ICD-10-CM | POA: Diagnosis not present

## 2023-02-17 ENCOUNTER — Other Ambulatory Visit: Payer: Self-pay

## 2023-02-19 ENCOUNTER — Other Ambulatory Visit: Payer: Self-pay | Admitting: Nurse Practitioner

## 2023-02-19 DIAGNOSIS — R634 Abnormal weight loss: Secondary | ICD-10-CM

## 2023-02-19 DIAGNOSIS — Z515 Encounter for palliative care: Secondary | ICD-10-CM

## 2023-02-19 DIAGNOSIS — R630 Anorexia: Secondary | ICD-10-CM

## 2023-02-19 DIAGNOSIS — C880 Waldenstrom macroglobulinemia: Secondary | ICD-10-CM

## 2023-02-19 DIAGNOSIS — G893 Neoplasm related pain (acute) (chronic): Secondary | ICD-10-CM

## 2023-02-19 DIAGNOSIS — G4700 Insomnia, unspecified: Secondary | ICD-10-CM

## 2023-02-21 ENCOUNTER — Other Ambulatory Visit (HOSPITAL_COMMUNITY): Payer: Self-pay

## 2023-02-21 ENCOUNTER — Other Ambulatory Visit: Payer: Self-pay

## 2023-02-21 MED ORDER — OXYCODONE HCL 10 MG PO TABS
10.0000 mg | ORAL_TABLET | ORAL | 0 refills | Status: DC | PRN
Start: 2023-02-21 — End: 2023-03-11
  Filled 2023-02-21: qty 90, 15d supply, fill #0

## 2023-02-21 MED ORDER — DIAZEPAM 5 MG PO TABS
5.0000 mg | ORAL_TABLET | Freq: Two times a day (BID) | ORAL | 0 refills | Status: DC | PRN
Start: 2023-02-24 — End: 2023-03-17
  Filled 2023-02-21: qty 60, 30d supply, fill #0

## 2023-02-21 MED ORDER — METHADONE HCL 10 MG PO TABS
10.0000 mg | ORAL_TABLET | Freq: Three times a day (TID) | ORAL | 0 refills | Status: DC
Start: 2023-02-25 — End: 2023-03-23
  Filled 2023-02-25: qty 90, 30d supply, fill #0

## 2023-02-21 MED ORDER — DRONABINOL 5 MG PO CAPS
5.0000 mg | ORAL_CAPSULE | Freq: Two times a day (BID) | ORAL | 0 refills | Status: DC
Start: 2023-02-21 — End: 2023-09-01
  Filled 2023-02-21 – 2023-08-08 (×4): qty 60, 30d supply, fill #0

## 2023-02-23 NOTE — Progress Notes (Signed)
Palliative Medicine [facility]  Telephone:(336) [phone] Fax:(336) [phone]   Name: Connor Solomon Date: 02/23/2023 MRN: [id number]  DOB: 1960/12/10  Patient Care Team: Adron Bene, MD (Inactive) as PCP - General (Internal Medicine) Pickenpack-Cousar, [name], NP as Nurse Practitioner (Nurse Practitioner)   REASON FOR CONSULTATION: Connor Solomon is a 62 y.o. 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 Palliative ask to see for symptom management.  SOCIAL HISTORY:     reports that he quit smoking about 2 years ago. His smoking use included cigars. He has never used smokeless tobacco. He reports that he does not drink alcohol and does not use drugs.  ADVANCE DIRECTIVES:    CODE STATUS:   PAST MEDICAL HISTORY: Past Medical History:  Diagnosis Date   Acute heart failure (HCC) 04/08/2021   Acute HFrEF (heart failure with reduced ejection fraction) (HCC) 04/07/2021   Arthritis    hands, elbows bilaterally   Cancer (HCC)    Full dentures    Hypertension    Pt states he doen not have HTN and has never been treated for HTN   Kidney stone on left side last stone 4-5 yrs ago   recurrent (3 episodes)   Panic disorder    pt states has resolved   Syncope    resolved- was related to panic attacks    PAST SURGICAL HISTORY:  Past Surgical History:  Procedure Laterality Date   CARPAL METACARPAL FUSION WITH DISTAL RADIAL BONE GRAFT Left 05/30/2013   Procedure: LEFT THUMB METACARPAL JOINT FUSION;  Surgeon: [name], MD;  Location: [facility];  Service: Orthopedics;  Laterality: Left;   ESOPHAGOGASTRODUODENOSCOPY N/A 02/12/2014   Procedure: ESOPHAGOGASTRODUODENOSCOPY (EGD);  Surgeon: [name], MD;  Location: [facility] END[facility];  Service: Endoscopy;  Laterality: N/A;   FOREIGN BODY REMOVAL N/A 02/12/2014   Procedure: FOREIGN BODY REMOVAL;  Surgeon:  [name], MD;  Location: [facility]COPY;  Service: Endoscopy;  Laterality: N/A;   OPEN REDUCTION INTERNAL FIXATION (ORIF) DISTAL RADIAL FRACTURE Left 11/29/2012   Procedure: OPEN REDUCTION INTERNAL FIXATION (ORIF) DISTAL RADIAL FRACTURE;  Surgeon: [name], MD;  Location: [facility];  Service: Orthopedics;  Laterality: Left;  LEFT DISTAL RADIUS OSTEOTOMY WITH BONE GRAFT   RIGHT/LEFT HEART CATH AND CORONARY ANGIOGRAPHY N/A 04/09/2021   Procedure: RIGHT/LEFT HEART CATH AND CORONARY ANGIOGRAPHY;  Surgeon: [name], MD;  [facility];  Service: Cardiovascular;  Laterality: N/A;   TENDON REPAIR Left 02/07/2013   Procedure: LEFT EXTENSOR INDICIS PROPRIUS  TO EXTENSOR POLLICIS LONGUS TENDON TRANSFER;  Surgeon: [name], MD;  Location: [facility];  Service: Orthopedics;  Laterality: Left;   WISDOM TOOTH EXTRACTION     WRIST SURGERY     fx only    HEMATOLOGY/ONCOLOGY HISTORY:  Oncology History  Waldenstrom macroglobulinemia (HCC)  05/24/2021 Initial Diagnosis   Waldenstrom macroglobulinemia (HCC)   06/05/2021 - 12/30/2021 Chemotherapy   Patient is on Treatment Plan : NON-HODGKINS LYMPHOMA Rituximab q7d       ALLERGIES:  is allergic to rituxan [rituximab] and hydrocodone.  MEDICATIONS:  Current Outpatient Medications  Medication Sig Dispense Refill   acetaminophen (TYLENOL) 500 MG tablet Take 500 mg by mouth every 6 (six) hours as needed. (Patient not taking: Reported on 01/10/2023)     albuterol (PROVENTIL HFA) 108 (90 Base) MCG/ACT inhaler Inhale 2 puffs into the lungs every  6 (six) hours as needed for wheezing or shortness of breath 6.7 g 2   carvedilol (COREG) 3.125 MG tablet Take 1 tablet by mouth 2 times daily. 60 tablet 6   clobetasol cream (TEMOVATE) 0.05 % Apply 1 Application topically 2 (two) times daily. Apply topically to the arms and hands twice daily for up to two weeks 30 g 2   dapagliflozin propanediol  (FARXIGA) 10 MG TABS tablet Take 1 tablet (10 mg total) by mouth daily before breakfast. 30 tablet 11   [START ON 02/24/2023] diazepam (VALIUM) 5 MG tablet Take 1 tablet (5 mg total) by mouth every 12 (twelve) hours as needed for anxiety. 60 tablet 0   doxycycline (VIBRA-TABS) 100 MG tablet Take 1 tablet (100 mg total) by mouth 2 (two) times daily. (Patient not taking: Reported on 01/10/2023) 20 tablet 0   dronabinol (MARINOL) 5 MG capsule Take 1 capsule (5 mg total) by mouth 2 (two) times daily before a meal. 60 capsule 0   eplerenone (INSPRA) 25 MG tablet Take 0.5 tablets (12.5 mg total) by mouth daily. 15 tablet 3   ferrous sulfate 325 (65 FE) MG tablet Take 1 tablet by mouth daily with breakfast. Please take with a source of Vitamin C 90 tablet 3   furosemide (LASIX) 20 MG tablet Take 1 tablet (20 mg total) by mouth daily as needed for swelling (Patient not taking: Reported on 01/10/2023) 100 tablet 2   ibrutinib (IMBRUVICA) 420 MG tablet Take 1 tablet (420 mg total) by mouth daily. 28 tablet 2   [START ON 02/25/2023] methadone (DOLOPHINE) 10 MG tablet Take 1 tablet (10 mg total) by mouth every 8 (eight) hours. 90 tablet 0   metoCLOPramide (REGLAN) 10 MG tablet Take 1 tablet (10 mg total) by mouth every 8 (eight) hours as needed for nausea. (Patient not taking: Reported on 01/10/2023) 30 tablet 1   naloxone (NARCAN) nasal spray 4 mg/0.1 mL Take for overdose (Patient not taking: Reported on 01/10/2023) 1 each 0   naloxone (NARCAN) nasal spray 4 mg/0.1 mL Take for overdose as directed (Patient not taking: Reported on 01/10/2023) 2 each 0   ondansetron (ZOFRAN) 8 MG tablet Take 1 tablet (8 mg total) by mouth every 8 (eight) hours as needed. 30 tablet 0   Oxycodone HCl 10 MG TABS Take 1 tablet by mouth every 4 hours as needed. 90 tablet 0   potassium chloride SA (KLOR-CON M) 20 MEQ tablet Take 1 tablet (20 mEq total) by mouth daily. 30 tablet 1   sacubitril-valsartan (ENTRESTO) 24-26 MG Take 1 tablet by mouth 2  (two) times daily. 180 tablet 3   No current facility-administered medications for this visit.    VITAL SIGNS: There were no vitals taken for this visit. There were no vitals filed for this visit.     Estimated body mass index is 20.1 kg/m as calculated from the following:   Height as of 09/01/22: 5\' 8"  (1.727 m).   Weight as of 01/10/23: 132 lb 3 oz (60 kg).   PERFORMANCE STATUS (ECOG) : 1 - Symptomatic but completely ambulatory  Assessment NAD RRR Normal breathing pattern  Right fingers swollen with some irritation, left leg edema with skin irritation  IMPRESSION:  Mr. Scafidi presents to clinic today for symptom management follow-up. No acute distress. Continues to do as good as he cans. Speaks to gratefulness to continue to be alive and not suffering. Is remaining active. Denies nausea, vomiting, constipation, or diarrhea.   Some irritation to  hands. Right > than left with swollen joints. Previously went to dermatologist and was given cream. Feels this has helped in the past. Small dark area to left leg. Some edema noted. No evidence of cellulitis.   Neoplasm related pain Ak[place]es reports overall pain is well controlled. Some days are better than others.   We discussed regimen:Methadone 10mg  three times daily. Oxycodone 10mg  as needed for breakthrough pain. Taking as prescribed. Reill request within appropriate timing.   We will continue close monitoring and adjust as needed.  No changes to current regimen at this time.  Constipation Controlled with diet and daily MiraLAX.  Appetite/decreased appetite.  Appetite had improved with use of marinol. Unfortunately there is now a Scientist, clinical (histocompatibility and immunogenetics). He expresses awareness of decrease in appetite and some weight loss since he has not been taking. Current weight 129lbs down from 132lbs on 4/1. Encouraged increase in protein and other natural supplements for support.   4. Anxiety/Anxiousness  Controlled.   5. Goals of care    (5/16) Mr. Conkin shares his realistic understanding of his condition. He and his wife are making sure all "affairs" are in order as his biggest worry is leaving his wife in a financial constraint. Reports communicating with his life insurance policy etc. Emotional support provided.    He is emotional expressing racing thoughts during idol time or interruptions in his sleep as his mind wonders thinking about worst case scenario. Is requesting something to assist with sleeping and anxiety. Acknowledged his request. Education provided on limited use in collaboration with other medications including zyprexa. He verbalized understanding and aware  we will continue to support and monitor for needs.      PLAN: Methadone 10 mg every 8 hours. Close monitoring. Tolerating well. Pain controlled. Oxycodone 10 mg as needed for breakthrough pain.  Miralax daily for constipation Valium 5mg  at twice daily. Zofran as needed for nausea as prescribed.  Marinol for appetite has been on Scientist, clinical (histocompatibility and immunogenetics). Unable to get at this time.  He was seen by dermatologist and prescribed topical skin creams which she reports is working. Doxycycline prescribed per Oncology.  I will plan to see patient back in 4-6 weeks in collaboration to other oncology appointments.    Patient expressed understanding and was in agreement with this plan. He also understands that He can call the clinic at any time with any questions, concerns, or complaints.    Any controlled substances utilized were prescribed in the context of palliative care. PDMP has been reviewed.    Visit consisted of counseling and education dealing with the complex and emotionally intense issues of symptom management and palliative care in the setting of serious and potentially life-threatening illness.Greater than 50%  of this time was spent counseling and coordinating care related to the above assessment and plan.  Willette Alma, AGPCNP-BC   Palliative Medicine Team/ [facility]  *Please note that this is a verbal dictation therefore any spelling or grammatical errors are due to the "[employer]" system interpretation.

## 2023-02-24 ENCOUNTER — Other Ambulatory Visit: Payer: Self-pay | Admitting: Physician Assistant

## 2023-02-24 DIAGNOSIS — C880 Waldenstrom macroglobulinemia: Secondary | ICD-10-CM

## 2023-02-25 ENCOUNTER — Inpatient Hospital Stay: Payer: Medicaid Other | Attending: Hematology and Oncology | Admitting: Physician Assistant

## 2023-02-25 ENCOUNTER — Inpatient Hospital Stay: Payer: Medicaid Other

## 2023-02-25 ENCOUNTER — Inpatient Hospital Stay (HOSPITAL_BASED_OUTPATIENT_CLINIC_OR_DEPARTMENT_OTHER): Payer: Medicaid Other | Admitting: Nurse Practitioner

## 2023-02-25 ENCOUNTER — Encounter: Payer: Self-pay | Admitting: Nurse Practitioner

## 2023-02-25 ENCOUNTER — Other Ambulatory Visit: Payer: Self-pay

## 2023-02-25 ENCOUNTER — Other Ambulatory Visit (HOSPITAL_COMMUNITY): Payer: Self-pay

## 2023-02-25 VITALS — BP 150/82 | HR 93 | Temp 97.8°F | Resp 15 | Wt 129.0 lb

## 2023-02-25 DIAGNOSIS — F419 Anxiety disorder, unspecified: Secondary | ICD-10-CM

## 2023-02-25 DIAGNOSIS — Z885 Allergy status to narcotic agent status: Secondary | ICD-10-CM | POA: Diagnosis not present

## 2023-02-25 DIAGNOSIS — K5903 Drug induced constipation: Secondary | ICD-10-CM

## 2023-02-25 DIAGNOSIS — C8800 Waldenstrom macroglobulinemia not having achieved remission: Secondary | ICD-10-CM

## 2023-02-25 DIAGNOSIS — L02426 Furuncle of left lower limb: Secondary | ICD-10-CM | POA: Insufficient documentation

## 2023-02-25 DIAGNOSIS — M7989 Other specified soft tissue disorders: Secondary | ICD-10-CM | POA: Insufficient documentation

## 2023-02-25 DIAGNOSIS — I110 Hypertensive heart disease with heart failure: Secondary | ICD-10-CM | POA: Diagnosis not present

## 2023-02-25 DIAGNOSIS — D472 Monoclonal gammopathy: Secondary | ICD-10-CM | POA: Diagnosis not present

## 2023-02-25 DIAGNOSIS — G893 Neoplasm related pain (acute) (chronic): Secondary | ICD-10-CM | POA: Diagnosis not present

## 2023-02-25 DIAGNOSIS — Z515 Encounter for palliative care: Secondary | ICD-10-CM | POA: Diagnosis not present

## 2023-02-25 DIAGNOSIS — Z79899 Other long term (current) drug therapy: Secondary | ICD-10-CM | POA: Diagnosis not present

## 2023-02-25 DIAGNOSIS — L03116 Cellulitis of left lower limb: Secondary | ICD-10-CM

## 2023-02-25 DIAGNOSIS — Z8249 Family history of ischemic heart disease and other diseases of the circulatory system: Secondary | ICD-10-CM | POA: Insufficient documentation

## 2023-02-25 DIAGNOSIS — Z5986 Financial insecurity: Secondary | ICD-10-CM | POA: Insufficient documentation

## 2023-02-25 DIAGNOSIS — Z87891 Personal history of nicotine dependence: Secondary | ICD-10-CM | POA: Insufficient documentation

## 2023-02-25 LAB — CMP (CANCER CENTER ONLY)
ALT: 7 U/L (ref 0–44)
AST: 13 U/L — ABNORMAL LOW (ref 15–41)
Albumin: 3.5 g/dL (ref 3.5–5.0)
Alkaline Phosphatase: 76 U/L (ref 38–126)
Anion gap: 6 (ref 5–15)
BUN: 10 mg/dL (ref 8–23)
CO2: 25 mmol/L (ref 22–32)
Calcium: 8.5 mg/dL — ABNORMAL LOW (ref 8.9–10.3)
Chloride: 107 mmol/L (ref 98–111)
Creatinine: 0.85 mg/dL (ref 0.61–1.24)
GFR, Estimated: >60 mL/min (ref >60)
Glucose, Bld: 98 mg/dL (ref 70–99)
Potassium: 3.6 mmol/L (ref 3.5–5.1)
Sodium: 138 mmol/L (ref 135–145)
Total Bilirubin: 0.3 mg/dL (ref 0.3–1.2)
Total Protein: 5.8 g/dL — ABNORMAL LOW (ref 6.5–8.1)

## 2023-02-25 LAB — LACTATE DEHYDROGENASE: LDH: 131 U/L (ref 98–192)

## 2023-02-25 LAB — CBC WITH DIFFERENTIAL (CANCER CENTER ONLY)
Abs Immature Granulocytes: 0.01 10*3/uL (ref 0.00–0.07)
Basophils Absolute: 0 10*3/uL (ref 0.0–0.1)
Basophils Relative: 1 %
Eosinophils Absolute: 0.8 10*3/uL — ABNORMAL HIGH (ref 0.0–0.5)
Eosinophils Relative: 15 %
HCT: 35.3 % — ABNORMAL LOW (ref 39.0–52.0)
Hemoglobin: 11.6 g/dL — ABNORMAL LOW (ref 13.0–17.0)
Immature Granulocytes: 0 %
Lymphocytes Relative: 28 %
Lymphs Abs: 1.6 10*3/uL (ref 0.7–4.0)
MCH: 27.5 pg (ref 26.0–34.0)
MCHC: 32.9 g/dL (ref 30.0–36.0)
MCV: 83.6 fL (ref 80.0–100.0)
Monocytes Absolute: 0.6 10*3/uL (ref 0.1–1.0)
Monocytes Relative: 10 %
Neutro Abs: 2.6 10*3/uL (ref 1.7–7.7)
Neutrophils Relative %: 46 %
Platelet Count: 267 10*3/uL (ref 150–400)
RBC: 4.22 MIL/uL (ref 4.22–5.81)
RDW: 15.1 % (ref 11.5–15.5)
WBC Count: 5.5 10*3/uL (ref 4.0–10.5)
nRBC: 0 % (ref 0.0–0.2)

## 2023-02-25 MED ORDER — DOXYCYCLINE HYCLATE 100 MG PO TABS
100.0000 mg | ORAL_TABLET | Freq: Two times a day (BID) | ORAL | 0 refills | Status: DC
  Filled 2023-02-25: qty 20, 10d supply, fill #0

## 2023-02-25 NOTE — Progress Notes (Signed)
[facility] [phone]   [phone]  PROGRESS NOTE  Patient Care Team: Adron Bene, MD (Inactive) as PCP - General (Internal Medicine) Pickenpack-Cousar, [name], NP as Nurse Practitioner (Nurse Practitioner)  Hematological/Oncological History # Waldenstrom's Macroglobulinemia # IgM Monoclonal Gammopathy 04/07/2021: CT A/p showed mildly enlarged retroperitoneal lymph nodes and bilateral inguinal lymph nodes.  04/08/2021: IgM Kappa M protein 1.2 04/09/2021: WBC 11.1, Hgb 9.5, MCV 75.4, Plt 525 04/20/2021: Kappa 977, Lambda 7.8, K/L ratio 125.28. Serum viscosity 1.8 05/06/2021: Establish care with Dr. 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 Infusion reaction with ritux, held halfway through.  06/12/2021: Cycle 2 Day 1 of Ibrutinib + Rituximab 06/19/2021: Cycle 3 Day 1 of Ibrutinib + Rituximab 06/26/2021: Cycle 4 Day 1 of rituximab. Continued on daily ibrutinib 12/02/2021: Cycle 5 Day 1 of Ibrutinib + Rituximab 12/09/2021: Cycle 6 Day 1 of Ibrutinib + Rituximab 12/16/2021: Cycle 7 Day 1 of Ibrutinib + Rituximab 12/23/2021: Held Cycle 8 of Ritxumab due to nausea/vomiting, elevated creatinine, finger infection  Interval History:  Connor Solomon 62 y.o. male with medical history significant for Waldenstrom's macroglobulinemia who presents for a follow up visit. The patient's last visit was on 01/10/23. In the interim since the last visit he has continued on Ibrutinib therapy.   On exam today Connor Solomon reports his energy overall stable. He does have swelling in his hands, right greater than left. He adds that the swelling comes and goes. He does have difficulty bending his fingers with the swelling and his skin will crack. He reports having a boil on his left calf which he  drained with a sterile needle. He reports his left leg is still swollen and red. He reports nausea that improves after eating pudding. He denies vomiting episodes. He has daily bowel movements without diarrhea or constipation. He denies easy bruising or signs of overt bleeding. He is tolerating ibrutinib therapy at this time.  He denies fevers, chills, night sweats, shortness of breath, chest pain or cough. He has no other complaints. Full 10 point ROS is listed below.   MEDICAL HISTORY:  Past Medical History:  Diagnosis Date   Acute heart failure (HCC) 04/08/2021   Acute HFrEF (heart failure with reduced ejection fraction) (HCC) 04/07/2021   Arthritis    hands, elbows bilaterally   Cancer (HCC)    Full dentures    Hypertension    Pt states he doen not have HTN and has never been treated for HTN   Kidney stone on left side last stone 4-5 yrs ago   recurrent (3 episodes)   Panic disorder    pt states has resolved   Syncope    resolved- was related to panic attacks    SURGICAL HISTORY: Past Surgical History:  Procedure Laterality Date   CARPAL METACARPAL FUSION WITH DISTAL RADIAL BONE GRAFT Left 05/30/2013   Procedure: LEFT THUMB METACARPAL JOINT FUSION;  Surgeon: [name], MD;  Location: [facility];  Service: Orthopedics;  Laterality: Left;   ESOPHAGOGASTRODUODENOSCOPY N/A 02/12/2014   Procedure: ESOPHAGOGASTRODUODENOSCOPY (EGD);  Surgeon: [name], MD;  Location: [facility] ENDOSCOPY;  Service: Endoscopy;  Laterality: N/A;   FOREIGN BODY REMOVAL N/A 02/12/2014   Procedure: FOREIGN BODY REMOVAL;  Surgeon: [name], MD;  Location: [facility] ENDOSCOPY;  Service: Endoscopy;  Laterality: N/A;   OPEN REDUCTION INTERNAL FIXATION (  ORIF) DISTAL RADIAL FRACTURE Left 11/29/2012   Procedure: OPEN REDUCTION INTERNAL FIXATION (ORIF) DISTAL RADIAL FRACTURE;  Surgeon: [name], MD;  Location: [facility];  Service: Orthopedics;  Laterality: Left;  LEFT DISTAL  RADIUS OSTEOTOMY WITH BONE GRAFT   RIGHT/LEFT HEART CATH AND CORONARY ANGIOGRAPHY N/A 04/09/2021   Procedure: RIGHT/LEFT HEART CATH AND CORONARY ANGIOGRAPHY;  Surgeon: [name], MD;  Location: [facility];  Service: Cardiovascular;  Laterality: N/A;   TENDON REPAIR Left 02/07/2013   Procedure: LEFT EXTENSOR INDICIS PROPRIUS  TO EXTENSOR POLLICIS LONGUS TENDON TRANSFER;  Surgeon: [name], MD;  Location: [facility];  Service: Orthopedics;  Laterality: Left;   WISDOM TOOTH EXTRACTION     WRIST SURGERY     fx only    SOCIAL HISTORY: Social History   Socioeconomic History   Marital status: Single    Spouse name: Not on file   Number of children: Not on file   Years of education: Not on file   Highest education level: Not on file  Occupational History   Not on file  Tobacco Use   Smoking status: Former    Types: Cigars    Quit date: 10/11/2020    Years since quitting: 2.3   Smokeless tobacco: Never   Tobacco comments:    smokes 1 cigar a week  Vaping Use   Vaping Use: Never used  Substance and Sexual Activity   Alcohol use: No   Drug use: No   Sexual activity: Not on file  Other Topics Concern   Not on file  Social History Narrative   Not on file   Social Determinants of Health   Financial Resource Strain: High Risk (07/17/2021)   Overall Financial Resource Strain (CARDIA)    Difficulty of Paying Living Expenses: Very hard  Food Insecurity: Food Insecurity Present (04/10/2021)   Hunger Vital Sign    Worried About Running Out of Food in the Last Year: Sometimes true    Ran Out of Food in the Last Year: Sometimes true  Transportation Needs: No Transportation Needs (04/10/2021)   PRAPARE - Administrator, Civil Service (Medical): No    Lack of Transportation (Non-Medical): No  Physical Activity: Not on file  Stress: Stress Concern Present (09/01/2021)   [employer] of Occupational Health - Occupational Stress  Questionnaire    Feeling of Stress : Very much  Social Connections: Not on file  Intimate Partner Violence: Not on file    FAMILY HISTORY: Family History  Problem Relation Age of Onset   Heart attack Father    Sudden Cardiac Death Father 43   Diabetes Neg Hx    Hyperlipidemia Neg Hx    Hypertension Neg Hx     ALLERGIES:  is allergic to rituxan [rituximab] and hydrocodone.  MEDICATIONS:  Current Outpatient Medications  Medication Sig Dispense Refill   albuterol (PROVENTIL HFA) 108 (90 Base) MCG/ACT inhaler Inhale 2 puffs into the lungs every 6 (six) hours as needed for wheezing or shortness of breath 6.7 g 2   carvedilol (COREG) 3.125 MG tablet Take 1 tablet by mouth 2 times daily. 60 tablet 6   clobetasol cream (TEMOVATE) 0.05 % Apply 1 Application topically 2 (two) times daily. Apply topically to the arms and hands twice daily for up to two weeks 30 g 2   dapagliflozin propanediol (FARXIGA) 10 MG TABS tablet Take 1 tablet (10 mg total) by mouth daily before breakfast. 30 tablet 11  diazepam (VALIUM) 5 MG tablet Take 1 tablet (5 mg total) by mouth every 12 (twelve) hours as needed for anxiety. 60 tablet 0   dronabinol (MARINOL) 5 MG capsule Take 1 capsule (5 mg total) by mouth 2 (two) times daily before a meal. 60 capsule 0   eplerenone (INSPRA) 25 MG tablet Take 0.5 tablets (12.5 mg total) by mouth daily. 15 tablet 3   ferrous sulfate 325 (65 FE) MG tablet Take 1 tablet by mouth daily with breakfast. Please take with a source of Vitamin C 90 tablet 3   ibrutinib (IMBRUVICA) 420 MG tablet Take 1 tablet (420 mg total) by mouth daily. 28 tablet 2   methadone (DOLOPHINE) 10 MG tablet Take 1 tablet (10 mg total) by mouth every 8 (eight) hours. 90 tablet 0   ondansetron (ZOFRAN) 8 MG tablet Take 1 tablet (8 mg total) by mouth every 8 (eight) hours as needed. 30 tablet 0   Oxycodone HCl 10 MG TABS Take 1 tablet by mouth every 4 hours as needed. 90 tablet 0   potassium chloride SA  (KLOR-CON M) 20 MEQ tablet Take 1 tablet (20 mEq total) by mouth daily. 30 tablet 1   sacubitril-valsartan (ENTRESTO) 24-26 MG Take 1 tablet by mouth 2 (two) times daily. 180 tablet 3   acetaminophen (TYLENOL) 500 MG tablet Take 500 mg by mouth every 6 (six) hours as needed. (Patient not taking: Reported on 01/10/2023)     doxycycline (VIBRA-TABS) 100 MG tablet Take 1 tablet (100 mg total) by mouth 2 (two) times daily. (Patient not taking: Reported on 01/10/2023) 20 tablet 0   furosemide (LASIX) 20 MG tablet Take 1 tablet (20 mg total) by mouth daily as needed for swelling (Patient not taking: Reported on 01/10/2023) 100 tablet 2   metoCLOPramide (REGLAN) 10 MG tablet Take 1 tablet (10 mg total) by mouth every 8 (eight) hours as needed for nausea. (Patient not taking: Reported on 01/10/2023) 30 tablet 1   naloxone (NARCAN) nasal spray 4 mg/0.1 mL Take for overdose 1 each 0   naloxone (NARCAN) nasal spray 4 mg/0.1 mL Take for overdose as directed (Patient not taking: Reported on 01/10/2023) 2 each 0   No current facility-administered medications for this visit.    REVIEW OF SYSTEMS:   Constitutional: ( - ) fevers, ( - )  chills , ( - ) night sweats Eyes: ( - ) blurriness of vision, ( - ) double vision, ( - ) watery eyes Ears, nose, mouth, throat, and face: ( - ) mucositis, ( - ) sore throat Respiratory: ( - ) cough, ( - ) dyspnea, ( - ) wheezes Cardiovascular: ( - ) palpitation, ( - ) chest discomfort, ( - ) lower extremity swelling Gastrointestinal:  ( + ) nausea, ( - ) heartburn, ( - ) change in bowel habits Skin: ( - ) abnormal skin rashes Lymphatics: ( - ) new lymphadenopathy, ( - ) easy bruising Neurological: ( - ) numbness, ( - ) tingling, ( - ) new weaknesses Behavioral/Psych: ( - ) mood change, ( - ) new changes  All other systems were reviewed with the patient and are negative.  PHYSICAL EXAMINATION: ECOG PERFORMANCE STATUS: 1 - Symptomatic but completely ambulatory  Vitals:   02/25/23  1010  BP: (!) 150/82  Pulse: 93  Resp: 15  Temp: 97.8 F (36.6 C)  SpO2: 100%    Filed Weights   02/25/23 1010  Weight: 129 lb (58.5 kg)    GENERAL: Chronically ill  appearing middle-aged Caucasian male, alert, no distress and comfortable SKIN: Dry cracking rash of hands bilaterally.  No issues on the chest, face, or lower extremities.  Skin color, texture, turgor are normal, no rashes or significant lesions.  EYES: conjunctiva are pink and non-injected, sclera clear LUNGS: clear to auscultation and percussion with normal breathing effort HEART: regular rate & rhythm and no murmurs and no lower extremity edema  MSK: Bilateral edema of hands, right greater than left. Scabbed area involving left calf with some induration, erythema and edema.  PSYCH: alert & oriented x 3, fluent speech NEURO: no focal motor/sensory deficits   LABORATORY DATA:  I have reviewed the data as listed    Latest Ref Rng & Units 02/25/2023    9:37 AM 01/10/2023   11:39 AM 11/25/2022    3:04 PM  CBC  WBC 4.0 - 10.5 K/uL 5.5  5.8  9.3   Hemoglobin 13.0 - 17.0 g/dL 16.1  09.6  04.5   Hematocrit 39.0 - 52.0 % 35.3  34.1  35.2   Platelets 150 - 400 K/uL 267  235  303        Latest Ref Rng & Units 02/25/2023    9:37 AM 01/10/2023   11:39 AM 11/25/2022    3:04 PM  CMP  Glucose 70 - 99 mg/dL 98  409  98   BUN 8 - 23 mg/dL 10  13  10    Creatinine 0.61 - 1.24 mg/dL 8.11  9.14  7.82   Sodium 135 - 145 mmol/L 138  138  135   Potassium 3.5 - 5.1 mmol/L 3.6  3.4  3.8   Chloride 98 - 111 mmol/L 107  106  103   CO2 22 - 32 mmol/L 25  24  25    Calcium 8.9 - 10.3 mg/dL 8.5  8.9  8.4   Total Protein 6.5 - 8.1 g/dL 5.8  5.9  5.7   Total Bilirubin 0.3 - 1.2 mg/dL 0.3  0.4  0.4   Alkaline Phos 38 - 126 U/L 76  63  60   AST 15 - 41 U/L 13  13  11    ALT 0 - 44 U/L 7  8  7      Lab Results  Component Value Date   MPROTEIN 0.2 (H) 01/10/2023   MPROTEIN 0.2 (H) 11/25/2022   MPROTEIN 0.2 (H) 10/28/2022   Lab Results   Component Value Date   KPAFRELGTCHN 33.9 (H) 01/10/2023   KPAFRELGTCHN 39.4 (H) 11/25/2022   KPAFRELGTCHN 60.3 (H) 10/28/2022   LAMBDASER 5.9 01/10/2023   LAMBDASER 8.7 11/25/2022   LAMBDASER 14.0 10/28/2022   KAPLAMBRATIO 5.75 (H) 01/10/2023   KAPLAMBRATIO 4.53 (H) 11/25/2022   KAPLAMBRATIO 4.31 (H) 10/28/2022    RADIOGRAPHIC STUDIES: No results found.  ASSESSMENT & PLAN Connor Solomon 62 y.o. male with medical history significant for Waldenstrom's macroglobulinemia who presents for a follow up visit.   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  There is no clear evidence of hyperviscosity syndrome at this time.  At this time we will plan to proceed with ibrutinib and rituximab therapy.  One could also consider Bendamustine and rituximab therapy but given the patient's degree of anemia I would prefer pursuing ibrutinib and rituximab at this time.  Additionally ibrutinib/rituximab are category 1 recommendations per the NCCN guidelines.  The treatment will consist of ibrutinib 420 mg p.o.  daily with rituximab on weeks 1 through 4 as well as weeks 27 through 30.  Previously we discussed the risks and benefits of therapy including (but not limited to) risk of bleeding, fatigue, atypical infections, and cardiac arrhythmias.  The patient voices understanding of this plan moving forward.   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 --Started second round of weekly rituximab on 12/02/2021. Continued weekly x 4 weeks.   --last rituximab dose held due to  missed visits/illness.  Plan: --Labs today were reviewed without any intervention needed.  Labs show white cell count 5.5, Hgb 11.6, MCV 83.6, Plt 267.  Pending SPEP and sFLC levels but prior levels appear stable. --Currently on ibrutinib 420 mg PO daily.  --RTC in 4 weeks with labs   #Left leg cellulitis --Sent doxycycline 100 mg BID x 10 days  #Rash-improved markedly -- Affecting hands bilaterally, upper extremities bilaterally, and back.  Not affecting face, chest, or lower extremities. -- Patient has dryness and cracking as well as erythema on the upper extremities. -- Etiology is unclear.  Patient is using hydrating creams but is not effective.  Made referral to dermatology for evaluation, he is established with Dr. Langston Reusing.  # Hypokalemia -- Potassium 3.6, continue to monitor   #Pain Management --Pain was poorly controlled on hydrocodone therapy previously.  --Patient was previously admitted for for opioid overdose with presumed fentanyl --Currently pain regimen includes methadone 10 mg TID and oxycodone 10 mg PRN for breakthrough pain per palliative care.  -- Appreciate recommendations of palliative care.  #Normocytic Anemia  --Hgb 11.6 --previously there was a component of iron deficiency anemia as well.  Currently on PO ferrous sulfate to try and bolster his iron levels.  --Continue to monitor.  #Nausea and vomiting--improving -- Concern that the nausea and vomiting may not be related to the ibrutinib therapy and potentially tied to poor gastric motility due to opioid prescription.  --Unable to tolerate olanzopine so discontinued --Not taking metoclopramide 10 mg every 8 hours  --continue zofran PRN.   #Supportive Care -- chemotherapy education complete -- port placement not required   No orders of the defined types were placed in this encounter.  All questions were answered. The patient knows to call the clinic with any problems, questions or concerns.  I  have spent a total of 30 minutes minutes of face-to-face and non-face-to-face time, preparing to see the patient,  performing a medically appropriate examination, counseling and educating the patient, ordering medications/tests, referring and communicating with other health care professionals, documenting clinical information in the electronic health record, and care coordination.   Georga Kaufmann PA-C Dept of Hematology and Oncology [facility] at [facility] Phone: [phone]   02/25/2023 10:33 AM  Dimopoulos MA, Lennette Bihari, Joaquin Courts, Mahe B, Herbaux C, Tam C, Orsucci L, [place] ML, Matous JV, [place] C, Kastritis E, [place], Li J, Salman Z, Graef T, Buske C; iNNOVATE Study Group and the [employer]. Phase 3 Trial of Ibrutinib plus Rituximab in Waldenstrm's Macroglobulinemia. [facility]acy Mis J Med. 2018 Jun 21;378(25):2399-2410.   --At 30 months, the progression-free survival rate was 82% with ibrutinib-rituximab versus 28% with placebo-rituximab (hazard ratio for progression or death, 0.20; P<0.001).

## 2023-02-28 ENCOUNTER — Other Ambulatory Visit (HOSPITAL_COMMUNITY): Payer: Self-pay

## 2023-03-01 ENCOUNTER — Other Ambulatory Visit: Payer: Self-pay

## 2023-03-01 ENCOUNTER — Other Ambulatory Visit (HOSPITAL_COMMUNITY): Payer: Self-pay

## 2023-03-01 LAB — KAPPA/LAMBDA LIGHT CHAINS
Kappa free light chain: 32 mg/L — ABNORMAL HIGH (ref 3.3–19.4)
Kappa, lambda light chain ratio: 4.1 — ABNORMAL HIGH (ref 0.26–1.65)
Lambda free light chains: 7.8 mg/L (ref 5.7–26.3)

## 2023-03-03 ENCOUNTER — Other Ambulatory Visit (HOSPITAL_COMMUNITY): Payer: Self-pay

## 2023-03-08 LAB — MULTIPLE MYELOMA PANEL, SERUM
Albumin SerPl Elph-Mcnc: 3.1 g/dL (ref 2.9–4.4)
Albumin/Glob SerPl: 1.3 (ref 0.7–1.7)
Alpha 1: 0.3 g/dL (ref 0.0–0.4)
Alpha2 Glob SerPl Elph-Mcnc: 0.7 g/dL (ref 0.4–1.0)
B-Globulin SerPl Elph-Mcnc: 0.8 g/dL (ref 0.7–1.3)
Gamma Glob SerPl Elph-Mcnc: 0.5 g/dL (ref 0.4–1.8)
Globulin, Total: 2.4 g/dL (ref 2.2–3.9)
IgA: 27 mg/dL — ABNORMAL LOW (ref 61–437)
IgG (Immunoglobin G), Serum: 533 mg/dL — ABNORMAL LOW (ref 603–1613)
IgM (Immunoglobulin M), Srm: 97 mg/dL (ref 20–172)
M Protein SerPl Elph-Mcnc: 0.1 g/dL — ABNORMAL HIGH
Total Protein ELP: 5.5 g/dL — ABNORMAL LOW (ref 6.0–8.5)

## 2023-03-09 ENCOUNTER — Other Ambulatory Visit (HOSPITAL_COMMUNITY): Payer: Self-pay

## 2023-03-10 ENCOUNTER — Other Ambulatory Visit (HOSPITAL_COMMUNITY): Payer: Self-pay

## 2023-03-11 ENCOUNTER — Other Ambulatory Visit: Payer: Self-pay | Admitting: Nurse Practitioner

## 2023-03-11 ENCOUNTER — Other Ambulatory Visit (HOSPITAL_COMMUNITY): Payer: Self-pay

## 2023-03-11 DIAGNOSIS — C880 Waldenstrom macroglobulinemia: Secondary | ICD-10-CM

## 2023-03-11 DIAGNOSIS — Z515 Encounter for palliative care: Secondary | ICD-10-CM

## 2023-03-11 DIAGNOSIS — G893 Neoplasm related pain (acute) (chronic): Secondary | ICD-10-CM

## 2023-03-11 MED ORDER — OXYCODONE HCL 10 MG PO TABS
10.0000 mg | ORAL_TABLET | ORAL | 0 refills | Status: DC | PRN
Start: 2023-03-11 — End: 2023-03-23
  Filled 2023-03-11: qty 90, 15d supply, fill #0

## 2023-03-12 DIAGNOSIS — Z419 Encounter for procedure for purposes other than remedying health state, unspecified: Secondary | ICD-10-CM | POA: Diagnosis not present

## 2023-03-13 ENCOUNTER — Other Ambulatory Visit: Payer: Self-pay | Admitting: Hematology and Oncology

## 2023-03-14 ENCOUNTER — Encounter (HOSPITAL_COMMUNITY): Payer: Self-pay

## 2023-03-14 ENCOUNTER — Other Ambulatory Visit (HOSPITAL_COMMUNITY): Payer: Self-pay

## 2023-03-14 MED ORDER — FERROUS SULFATE 325 (65 FE) MG PO TABS
325.0000 mg | ORAL_TABLET | Freq: Every day | ORAL | 3 refills | Status: DC
  Filled 2023-03-14: qty 90, 90d supply, fill #0
  Filled 2023-06-11 – 2023-06-28 (×3): qty 90, 90d supply, fill #1
  Filled 2023-10-06: qty 90, 90d supply, fill #2
  Filled 2024-01-10: qty 90, 90d supply, fill #3

## 2023-03-16 ENCOUNTER — Other Ambulatory Visit: Payer: Self-pay

## 2023-03-16 ENCOUNTER — Encounter: Payer: Self-pay | Admitting: *Deleted

## 2023-03-16 ENCOUNTER — Other Ambulatory Visit (HOSPITAL_COMMUNITY): Payer: Self-pay

## 2023-03-17 ENCOUNTER — Other Ambulatory Visit (HOSPITAL_COMMUNITY): Payer: Self-pay

## 2023-03-17 ENCOUNTER — Other Ambulatory Visit: Payer: Self-pay | Admitting: Nurse Practitioner

## 2023-03-17 DIAGNOSIS — Z515 Encounter for palliative care: Secondary | ICD-10-CM

## 2023-03-17 DIAGNOSIS — C880 Waldenstrom macroglobulinemia: Secondary | ICD-10-CM

## 2023-03-17 DIAGNOSIS — G893 Neoplasm related pain (acute) (chronic): Secondary | ICD-10-CM

## 2023-03-17 DIAGNOSIS — G4700 Insomnia, unspecified: Secondary | ICD-10-CM

## 2023-03-17 MED ORDER — DIAZEPAM 5 MG PO TABS
5.0000 mg | ORAL_TABLET | Freq: Two times a day (BID) | ORAL | 0 refills | Status: DC | PRN
Start: 2023-03-23 — End: 2023-04-16
  Filled 2023-03-17 – 2023-03-23 (×3): qty 60, 30d supply, fill #0

## 2023-03-19 ENCOUNTER — Other Ambulatory Visit (HOSPITAL_COMMUNITY): Payer: Self-pay

## 2023-03-21 ENCOUNTER — Other Ambulatory Visit (HOSPITAL_COMMUNITY): Payer: Self-pay

## 2023-03-22 NOTE — Progress Notes (Unsigned)
Palliative Medicine [facility]  [phone] [phone]   Name: Connor Solomon Date: 03/22/2023 MRN: [id number]  DOB: Jan 27, 1961  Patient Care Team: Adron Bene, MD (Inactive) as PCP - General (Internal Medicine) Pickenpack-Cousar, [name], NP as Nurse Practitioner (Nurse Practitioner)   REASON FOR CONSULTATION: Connor Solomon is a 62 y.o. 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 Palliative ask to see for symptom management.  SOCIAL HISTORY:     reports that he quit smoking about 2 years ago. His smoking use included cigars. He has never used smokeless tobacco. He reports that he does not drink alcohol and does not use drugs.  ADVANCE DIRECTIVES:    CODE STATUS:   PAST MEDICAL HISTORY: Past Medical History:  Diagnosis Date   Acute heart failure (HCC) 04/08/2021   Acute HFrEF (heart failure with reduced ejection fraction) (HCC) 04/07/2021   Arthritis    hands, elbows bilaterally   Cancer (HCC)    Full dentures    Hypertension    Pt states he doen not have HTN and has never been treated for HTN   Kidney stone on left side last stone 4-5 yrs ago   recurrent (3 episodes)   Panic disorder    pt states has resolved   Syncope    resolved- was related to panic attacks    PAST SURGICAL HISTORY:  Past Surgical History:  Procedure Laterality Date   CARPAL METACARPAL FUSION WITH DISTAL RADIAL BONE GRAFT Left 05/30/2013   Procedure: LEFT THUMB METACARPAL JOINT FUSION;  Surgeon: [name], MD;  Location: [facility];  Service: Orthopedics;  Laterality: Left;   ESOPHAGOGASTRODUODENOSCOPY N/A 02/12/2014   Procedure: ESOPHAGOGASTRODUODENOSCOPY (EGD);  Surgeon: [name], MD;  Location: [facility] ENDOSCOPY;  Service: Endoscopy;  Laterality: N/A;   FOREIGN BODY REMOVAL N/A 02/12/2014   Procedure: FOREIGN BODY REMOVAL;  Surgeon:  [name], MD;  Location[facility] ENDOSCOPY;  Service: Endoscopy;  Laterality: N/A;   OPEN REDUCTION INTERNAL FIXATION (ORIF) DISTAL RADIAL FRACTURE Left 11/29/2012   Procedure: OPEN REDUCTION INTERNAL FIXATION (ORIF) DISTAL RADIAL FRACTURE;  Surgeon: [name], MD;  Location: [facility];  Service: Orthopedics;  Laterality: Left;  LEFT DISTAL RADIUS OSTEOTOMY WITH BONE GRAFT   RIGHT/LEFT HEART CATH AND CORONARY ANGIOGRAPHY N/A 04/09/2021   Procedure: RIGHT/LEFT HEART CATH AND CORONARY ANGIOGRAPHY;  Surgeon: [name], MD;  Location: [facility];  Service: Cardiovascular;  Laterality: N/A;   TENDON REPAIR Left 02/07/2013   Procedure: LEFT EXTENSOR INDICIS PROPRIUS  TO EXTENSOR POLLICIS LONGUS TENDON TRANSFER;  Surgeon: [name], MD;  Location:  SURGERY CENTER;  Service: Orthopedics;  Laterality: Left;   WISDOM TOOTH EXTRACTION     WRIST SURGERY     fx only    HEMATOLOGY/ONCOLOGY HISTORY:  Oncology History  Waldenstrom macroglobulinemia (HCC)  05/24/2021 Initial Diagnosis   Waldenstrom macroglobulinemia (HCC)   06/05/2021 - 12/30/2021 Chemotherapy   Patient is on Treatment Plan : NON-HODGKINS LYMPHOMA Rituximab q7d       ALLERGIES:  is allergic to rituxan [rituximab] and hydrocodone.  MEDICATIONS:  Current Outpatient Medications  Medication Sig Dispense Refill   acetaminophen (TYLENOL) 500 MG tablet Take 500 mg by mouth every 6 (six) hours as needed. (Patient not taking: Reported on 01/10/2023)     albuterol (PROVENTIL HFA) 108 (90 Base) MCG/ACT inhaler Inhale 2 puffs into the lungs every  6 (six) hours as needed for wheezing or shortness of breath 6.7 g 2   carvedilol (COREG) 3.125 MG tablet Take 1 tablet by mouth 2 times daily. 60 tablet 6   clobetasol cream (TEMOVATE) 0.05 % Apply 1 Application topically 2 (two) times daily. Apply topically to the arms and hands twice daily for up to two weeks 30 g 2   dapagliflozin propanediol  (FARXIGA) 10 MG TABS tablet Take 1 tablet (10 mg total) by mouth daily before breakfast. 30 tablet 11   [START ON 03/23/2023] diazepam (VALIUM) 5 MG tablet Take 1 tablet (5 mg total) by mouth every 12 (twelve) hours as needed for anxiety. 60 tablet 0   doxycycline (VIBRA-TABS) 100 MG tablet Take 1 tablet (100 mg total) by mouth 2 (two) times daily. 20 tablet 0   dronabinol (MARINOL) 5 MG capsule Take 1 capsule (5 mg total) by mouth 2 (two) times daily before a meal. 60 capsule 0   eplerenone (INSPRA) 25 MG tablet Take 0.5 tablets (12.5 mg total) by mouth daily. 15 tablet 3   ferrous sulfate (FEROSUL) 325 (65 FE) MG tablet Take 1 tablet by mouth daily with breakfast. Please take with a source of Vitamin C 90 tablet 3   ibrutinib (IMBRUVICA) 420 MG tablet Take 1 tablet (420 mg total) by mouth daily. 28 tablet 2   methadone (DOLOPHINE) 10 MG tablet Take 1 tablet (10 mg total) by mouth every 8 (eight) hours. 90 tablet 0   metoCLOPramide (REGLAN) 10 MG tablet Take 1 tablet (10 mg total) by mouth every 8 (eight) hours as needed for nausea. (Patient not taking: Reported on 01/10/2023) 30 tablet 1   naloxone (NARCAN) nasal spray 4 mg/0.1 mL Take for overdose 1 each 0   naloxone (NARCAN) nasal spray 4 mg/0.1 mL Take for overdose as directed (Patient not taking: Reported on 01/10/2023) 2 each 0   ondansetron (ZOFRAN) 8 MG tablet Take 1 tablet (8 mg total) by mouth every 8 (eight) hours as needed. 30 tablet 0   Oxycodone HCl 10 MG TABS Take 1 tablet by mouth every 4 hours as needed. 90 tablet 0   potassium chloride SA (KLOR-CON M) 20 MEQ tablet Take 1 tablet (20 mEq total) by mouth daily. 30 tablet 1   sacubitril-valsartan (ENTRESTO) 24-26 MG Take 1 tablet by mouth 2 (two) times daily. 180 tablet 3   No current facility-administered medications for this visit.    VITAL SIGNS: There were no vitals taken for this visit. There were no vitals filed for this visit.     Estimated body mass index is 19.61 kg/m as  calculated from the following:   Height as of 09/01/22: 5\' 8"  (1.727 m).   Weight as of 02/25/23: 129 lb (58.5 kg).   PERFORMANCE STATUS (ECOG) : 1 - Symptomatic but completely ambulatory  Assessment NAD RRR Normal breathing pattern  Right fingers swollen with some irritation, left leg edema with skin irritation  IMPRESSION:   Neoplasm related pain Trigger reports overall pain is well controlled. Some days are better than others.   We discussed regimen:Methadone 10mg  three times daily. Oxycodone 10mg  as needed for breakthrough pain. Taking as prescribed. Reill request within appropriate timing.   We will continue close monitoring and adjust as needed.  No changes to current regimen at this time.  Constipation Controlled with diet and daily MiraLAX.  Appetite/decreased appetite.     4. Anxiety/Anxiousness  Controlled.   5. Goals of care   (5/16) Mr. Causer  shares his realistic understanding of his condition. He and his wife are making sure all "affairs" are in order as his biggest worry is leaving his wife in a financial constraint. Reports communicating with his life insurance policy etc. Emotional support provided.    He is emotional expressing racing thoughts during idol time or interruptions in his sleep as his mind wonders thinking about worst case scenario. Is requesting something to assist with sleeping and anxiety. Acknowledged his request. Education provided on limited use in collaboration with other medications including zyprexa. He verbalized understanding and aware  we will continue to support and monitor for needs.      PLAN: Methadone 10 mg every 8 hours. Close monitoring. Tolerating well. Pain controlled. Oxycodone 10 mg as needed for breakthrough pain.  Miralax daily for constipation Valium 5mg  at twice daily. Zofran as needed for nausea as prescribed.  Marinol for appetite has been on Scientist, clinical (histocompatibility and immunogenetics). Unable to get at this time.  He was seen by  dermatologist and prescribed topical skin creams which she reports is working. Doxycycline prescribed per Oncology.  I will plan to see patient back in 4-6 weeks in collaboration to other oncology appointments.    Patient expressed understanding and was in agreement with this plan. He also understands that He can call the clinic at any time with any questions, concerns, or complaints.    Any controlled substances utilized were prescribed in the context of palliative care. PDMP has been reviewed.    Visit consisted of counseling and education dealing with the complex and emotionally intense issues of symptom management and palliative care in the setting of serious and potentially life-threatening illness.Greater than 50%  of this time was spent counseling and coordinating care related to the above assessment and plan.  Willette Alma, AGPCNP-BC  Palliative Medicine Team/[facility]  *Please note that this is a verbal dictation therefore any spelling or grammatical errors are due to the "[employer]" system interpretation.

## 2023-03-23 ENCOUNTER — Inpatient Hospital Stay (HOSPITAL_BASED_OUTPATIENT_CLINIC_OR_DEPARTMENT_OTHER): Payer: Medicaid Other | Admitting: Hematology and Oncology

## 2023-03-23 ENCOUNTER — Encounter: Payer: Self-pay | Admitting: Nurse Practitioner

## 2023-03-23 ENCOUNTER — Inpatient Hospital Stay (HOSPITAL_BASED_OUTPATIENT_CLINIC_OR_DEPARTMENT_OTHER): Payer: Medicaid Other | Admitting: Nurse Practitioner

## 2023-03-23 ENCOUNTER — Other Ambulatory Visit: Payer: Self-pay

## 2023-03-23 ENCOUNTER — Inpatient Hospital Stay: Payer: Medicaid Other | Attending: Hematology and Oncology

## 2023-03-23 ENCOUNTER — Inpatient Hospital Stay: Payer: Medicaid Other

## 2023-03-23 ENCOUNTER — Other Ambulatory Visit (HOSPITAL_COMMUNITY): Payer: Self-pay

## 2023-03-23 VITALS — BP 162/75 | HR 65 | Temp 97.7°F | Resp 17 | Wt 130.2 lb

## 2023-03-23 DIAGNOSIS — C880 Waldenstrom macroglobulinemia: Secondary | ICD-10-CM

## 2023-03-23 DIAGNOSIS — Z87891 Personal history of nicotine dependence: Secondary | ICD-10-CM | POA: Diagnosis not present

## 2023-03-23 DIAGNOSIS — R21 Rash and other nonspecific skin eruption: Secondary | ICD-10-CM | POA: Insufficient documentation

## 2023-03-23 DIAGNOSIS — R110 Nausea: Secondary | ICD-10-CM

## 2023-03-23 DIAGNOSIS — R590 Localized enlarged lymph nodes: Secondary | ICD-10-CM | POA: Diagnosis not present

## 2023-03-23 DIAGNOSIS — Z5986 Financial insecurity: Secondary | ICD-10-CM | POA: Diagnosis not present

## 2023-03-23 DIAGNOSIS — G893 Neoplasm related pain (acute) (chronic): Secondary | ICD-10-CM

## 2023-03-23 DIAGNOSIS — Z885 Allergy status to narcotic agent status: Secondary | ICD-10-CM | POA: Insufficient documentation

## 2023-03-23 DIAGNOSIS — R630 Anorexia: Secondary | ICD-10-CM | POA: Diagnosis not present

## 2023-03-23 DIAGNOSIS — Z79899 Other long term (current) drug therapy: Secondary | ICD-10-CM | POA: Insufficient documentation

## 2023-03-23 DIAGNOSIS — Z8249 Family history of ischemic heart disease and other diseases of the circulatory system: Secondary | ICD-10-CM | POA: Diagnosis not present

## 2023-03-23 DIAGNOSIS — D472 Monoclonal gammopathy: Secondary | ICD-10-CM | POA: Diagnosis not present

## 2023-03-23 DIAGNOSIS — R112 Nausea with vomiting, unspecified: Secondary | ICD-10-CM | POA: Diagnosis not present

## 2023-03-23 DIAGNOSIS — R530 Neoplastic (malignant) related fatigue: Secondary | ICD-10-CM

## 2023-03-23 DIAGNOSIS — E876 Hypokalemia: Secondary | ICD-10-CM | POA: Insufficient documentation

## 2023-03-23 DIAGNOSIS — Z515 Encounter for palliative care: Secondary | ICD-10-CM | POA: Diagnosis not present

## 2023-03-23 DIAGNOSIS — D649 Anemia, unspecified: Secondary | ICD-10-CM | POA: Insufficient documentation

## 2023-03-23 DIAGNOSIS — L03116 Cellulitis of left lower limb: Secondary | ICD-10-CM | POA: Diagnosis not present

## 2023-03-23 LAB — CBC WITH DIFFERENTIAL (CANCER CENTER ONLY)
Abs Immature Granulocytes: 0 10*3/uL (ref 0.00–0.07)
Basophils Absolute: 0 10*3/uL (ref 0.0–0.1)
Basophils Relative: 1 %
Eosinophils Absolute: 0.8 10*3/uL — ABNORMAL HIGH (ref 0.0–0.5)
Eosinophils Relative: 16 %
HCT: 35.4 % — ABNORMAL LOW (ref 39.0–52.0)
Hemoglobin: 11.2 g/dL — ABNORMAL LOW (ref 13.0–17.0)
Immature Granulocytes: 0 %
Lymphocytes Relative: 29 %
Lymphs Abs: 1.6 10*3/uL (ref 0.7–4.0)
MCH: 27.3 pg (ref 26.0–34.0)
MCHC: 31.6 g/dL (ref 30.0–36.0)
MCV: 86.1 fL (ref 80.0–100.0)
Monocytes Absolute: 0.5 10*3/uL (ref 0.1–1.0)
Monocytes Relative: 9 %
Neutro Abs: 2.5 10*3/uL (ref 1.7–7.7)
Neutrophils Relative %: 45 %
Platelet Count: 197 10*3/uL (ref 150–400)
RBC: 4.11 MIL/uL — ABNORMAL LOW (ref 4.22–5.81)
RDW: 14.8 % (ref 11.5–15.5)
WBC Count: 5.4 10*3/uL (ref 4.0–10.5)
nRBC: 0 % (ref 0.0–0.2)

## 2023-03-23 LAB — CMP (CANCER CENTER ONLY)
ALT: 9 U/L (ref 0–44)
AST: 16 U/L (ref 15–41)
Albumin: 3.9 g/dL (ref 3.5–5.0)
Alkaline Phosphatase: 55 U/L (ref 38–126)
Anion gap: 6 (ref 5–15)
BUN: 12 mg/dL (ref 8–23)
CO2: 28 mmol/L (ref 22–32)
Calcium: 9 mg/dL (ref 8.9–10.3)
Chloride: 105 mmol/L (ref 98–111)
Creatinine: 0.71 mg/dL (ref 0.61–1.24)
GFR, Estimated: >60 mL/min (ref >60)
Glucose, Bld: 77 mg/dL (ref 70–99)
Potassium: 3.8 mmol/L (ref 3.5–5.1)
Sodium: 139 mmol/L (ref 135–145)
Total Bilirubin: 0.5 mg/dL (ref 0.3–1.2)
Total Protein: 6.2 g/dL — ABNORMAL LOW (ref 6.5–8.1)

## 2023-03-23 LAB — LACTATE DEHYDROGENASE: LDH: 149 U/L (ref 98–192)

## 2023-03-23 MED ORDER — ONDANSETRON HCL 8 MG PO TABS
8.0000 mg | ORAL_TABLET | Freq: Once | ORAL | Status: AC
  Administered 2023-03-23: 8 mg via ORAL
  Filled 2023-03-23: qty 1

## 2023-03-23 MED ORDER — ONDANSETRON HCL 8 MG PO TABS
8.0000 mg | ORAL_TABLET | Freq: Three times a day (TID) | ORAL | 0 refills | Status: DC | PRN
Start: 2023-03-23 — End: 2023-06-06
  Filled 2023-03-23: qty 30, 10d supply, fill #0

## 2023-03-23 MED ORDER — OXYCODONE HCL 10 MG PO TABS
10.0000 mg | ORAL_TABLET | ORAL | 0 refills | Status: DC | PRN
Start: 2023-03-23 — End: 2023-04-04
  Filled 2023-03-23 (×2): qty 90, 15d supply, fill #0

## 2023-03-23 MED ORDER — METHADONE HCL 10 MG PO TABS
10.0000 mg | ORAL_TABLET | Freq: Three times a day (TID) | ORAL | 0 refills | Status: DC
Start: 2023-03-26 — End: 2023-04-27
  Filled 2023-03-26: qty 81, 27d supply, fill #0
  Filled 2023-03-26: qty 9, 3d supply, fill #0

## 2023-03-23 NOTE — Progress Notes (Signed)
[facility] [phone]   [phone]  PROGRESS NOTE  Patient Care Team: Adron Bene, MD (Inactive) as PCP - General (Internal Medicine) Pickenpack-Cousar, [name], NP as Nurse Practitioner (Nurse Practitioner)  Hematological/Oncological History # Waldenstrom's Macroglobulinemia # IgM Monoclonal Gammopathy 04/07/2021: CT A/p showed mildly enlarged retroperitoneal lymph nodes and bilateral inguinal lymph nodes.  04/08/2021: IgM Kappa M protein 1.2 04/09/2021: WBC 11.1, Hgb 9.5, MCV 75.4, Plt 525 04/20/2021: Kappa 977, Lambda 7.8, K/L ratio 125.28. Serum viscosity 1.8 05/06/2021: Establish care with Dr. 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 Infusion reaction with ritux, held halfway through.  06/12/2021: Cycle 2 Day 1 of Ibrutinib + Rituximab 06/19/2021: Cycle 3 Day 1 of Ibrutinib + Rituximab 06/26/2021: Cycle 4 Day 1 of rituximab. Continued on daily ibrutinib 12/02/2021: Cycle 5 Day 1 of Ibrutinib + Rituximab 12/09/2021: Cycle 6 Day 1 of Ibrutinib + Rituximab 12/16/2021: Cycle 7 Day 1 of Ibrutinib + Rituximab 12/23/2021: Held Cycle 8 of Ritxumab due to nausea/vomiting, elevated creatinine, finger infection  Interval History:  Connor Solomon 62 y.o. male with medical history significant for Waldenstrom's macroglobulinemia who presents for a follow up visit. The patient's last visit was on 02/25/23. In the interim since the last visit he has continued on Ibrutinib therapy.   On exam today Mr. Rouland reports he is a little bit nauseated this morning and unfortunate does not have Zofran on him.  He reports that his skin has improved and his hands look good.  He thinks it was the combination of the antibiotics and the steroids that helped with his symptoms.  He notes his  energy levels are stably low and that he is always "worn out".  He notes that he is eating okay but not as good as he would like.  Unfortunately Marinol is on backorder and he has not been able to get his hands on this.  He is not having any infectious symptoms at this time.  Overall he is willing and able to proceed with ibrutinib therapy. He is tolerating ibrutinib therapy at this time.  He denies fevers, chills, night sweats, shortness of breath, chest pain or cough. He has no other complaints. Full 10 point ROS is listed below.   MEDICAL HISTORY:  Past Medical History:  Diagnosis Date   Acute heart failure (HCC) 04/08/2021   Acute HFrEF (heart failure with reduced ejection fraction) (HCC) 04/07/2021   Arthritis    hands, elbows bilaterally   Cancer (HCC)    Full dentures    Hypertension    Pt states he doen not have HTN and has never been treated for HTN   Kidney stone on left side last stone 4-5 yrs ago   recurrent (3 episodes)   Panic disorder    pt states has resolved   Syncope    resolved- was related to panic attacks    SURGICAL HISTORY: Past Surgical History:  Procedure Laterality Date   CARPAL METACARPAL FUSION WITH DISTAL RADIAL BONE GRAFT Left 05/30/2013   Procedure: LEFT THUMB METACARPAL JOINT FUSION;  Surgeon: [name], MD;  Location: [facility];  Service: Orthopedics;  Laterality: Left;   ESOPHAGOGASTRODUODENOSCOPY N/A 02/12/2014   Procedure: ESOPHAGOGASTRODUODENOSCOPY (EGD);  Surgeon: [name], MD;  Location: [facility] ENDOSCOPY;  Service: Endoscopy;  Laterality: N/A;   FOREIGN BODY REMOVAL N/A 02/12/2014  Procedure: FOREIGN BODY REMOVAL;  Surgeon: [name], MD;  Location: [facility] ENDOSCOPY;  Service: Endoscopy;  Laterality: N/A;   OPEN REDUCTION INTERNAL FIXATION (ORIF) DISTAL RADIAL FRACTURE Left 11/29/2012   Procedure: OPEN REDUCTION INTERNAL FIXATION (ORIF) DISTAL RADIAL FRACTURE;  Surgeon: [name], MD;  Location:  SURGERY  CENTER;  Service: Orthopedics;  Laterality: Left;  LEFT DISTAL RADIUS OSTEOTOMY WITH BONE GRAFT   RIGHT/LEFT HEART CATH AND CORONARY ANGIOGRAPHY N/A 04/09/2021   Procedure: RIGHT/LEFT HEART CATH AND CORONARY ANGIOGRAPHY;  Surgeon: [name], MD;  Location: [facility];  Service: Cardiovascular;  Laterality: N/A;   TENDON REPAIR Left 02/07/2013   Procedure: LEFT EXTENSOR INDICIS PROPRIUS  TO EXTENSOR POLLICIS LONGUS TENDON TRANSFER;  Surgeon: [name], MD;  Location:  SURGERY CENTER;  Service: Orthopedics;  Laterality: Left;   WISDOM TOOTH EXTRACTION     WRIST SURGERY     fx only    SOCIAL HISTORY: Social History   Socioeconomic History   Marital status: Single    Spouse name: Not on file   Number of children: Not on file   Years of education: Not on file   Highest education level: Not on file  Occupational History   Not on file  Tobacco Use   Smoking status: Former    Types: Cigars    Quit date: 10/11/2020    Years since quitting: 2.4   Smokeless tobacco: Never   Tobacco comments:    smokes 1 cigar a week  Vaping Use   Vaping Use: Never used  Substance and Sexual Activity   Alcohol use: No   Drug use: No   Sexual activity: Not on file  Other Topics Concern   Not on file  Social History Narrative   Not on file   Social Determinants of Health   Financial Resource Strain: High Risk (07/17/2021)   Overall Financial Resource Strain (CARDIA)    Difficulty of Paying Living Expenses: Very hard  Food Insecurity: Food Insecurity Present (04/10/2021)   Hunger Vital Sign    Worried About Running Out of Food in the Last Year: Sometimes true    Ran Out of Food in the Last Year: Sometimes true  Transportation Needs: No Transportation Needs (04/10/2021)   PRAPARE - Administrator, Civil Service (Medical): No    Lack of Transportation (Non-Medical): No  Physical Activity: Not on file  Stress: Stress Concern Present (09/01/2021)   [employer] - Occupational Stress Questionnaire    Feeling of Stress : Very much  Social Connections: Not on file  Intimate Partner Violence: Not on file    FAMILY HISTORY: Family History  Problem Relation Age of Onset   Heart attack Father    Sudden Cardiac Death Father 35   Diabetes Neg Hx    Hyperlipidemia Neg Hx    Hypertension Neg Hx     ALLERGIES:  is allergic to rituxan [rituximab] and hydrocodone.  MEDICATIONS:  Current Outpatient Medications  Medication Sig Dispense Refill   acetaminophen (TYLENOL) 500 MG tablet Take 500 mg by mouth every 6 (six) hours as needed. (Patient not taking: Reported on 01/10/2023)     albuterol (PROVENTIL HFA) 108 (90 Base) MCG/ACT inhaler Inhale 2 puffs into the lungs every 6 (six) hours as needed for wheezing or shortness of breath 6.7 g 2   carvedilol (COREG) 3.125 MG tablet Take 1 tablet by mouth 2 times daily. 60 tablet 6   clobetasol  cream (TEMOVATE) 0.05 % Apply 1 Application topically 2 (two) times daily. Apply topically to the arms and hands twice daily for up to two weeks 30 g 2   dapagliflozin propanediol (FARXIGA) 10 MG TABS tablet Take 1 tablet (10 mg total) by mouth daily before breakfast. 30 tablet 11   diazepam (VALIUM) 5 MG tablet Take 1 tablet (5 mg total) by mouth every 12 (twelve) hours as needed for anxiety. 60 tablet 0   dronabinol (MARINOL) 5 MG capsule Take 1 capsule (5 mg total) by mouth 2 (two) times daily before a meal. 60 capsule 0   eplerenone (INSPRA) 25 MG tablet Take 0.5 tablets (12.5 mg total) by mouth daily. 15 tablet 3   ferrous sulfate (FEROSUL) 325 (65 FE) MG tablet Take 1 tablet by mouth daily with breakfast. Please take with a source of Vitamin C 90 tablet 3   ibrutinib (IMBRUVICA) 420 MG tablet Take 1 tablet (420 mg total) by mouth daily. 28 tablet 2   [START ON 03/26/2023] methadone (DOLOPHINE) 10 MG tablet Take 1 tablet (10 mg total) by mouth every 8 (eight) hours. 90 tablet 0   naloxone  (NARCAN) nasal spray 4 mg/0.1 mL Take for overdose 1 each 0   naloxone (NARCAN) nasal spray 4 mg/0.1 mL Take for overdose as directed (Patient not taking: Reported on 01/10/2023) 2 each 0   ondansetron (ZOFRAN) 8 MG tablet Take 1 tablet (8 mg total) by mouth every 8 (eight) hours as needed. 30 tablet 0   Oxycodone HCl 10 MG TABS Take 1 tablet by mouth every 4 hours as needed. 90 tablet 0   potassium chloride SA (KLOR-CON M) 20 MEQ tablet Take 1 tablet (20 mEq total) by mouth daily. 30 tablet 1   sacubitril-valsartan (ENTRESTO) 24-26 MG Take 1 tablet by mouth 2 (two) times daily. 180 tablet 3   No current facility-administered medications for this visit.    REVIEW OF SYSTEMS:   Constitutional: ( - ) fevers, ( - )  chills , ( - ) night sweats Eyes: ( - ) blurriness of vision, ( - ) double vision, ( - ) watery eyes Ears, nose, mouth, throat, and face: ( - ) mucositis, ( - ) sore throat Respiratory: ( - ) cough, ( - ) dyspnea, ( - ) wheezes Cardiovascular: ( - ) palpitation, ( - ) chest discomfort, ( - ) lower extremity swelling Gastrointestinal:  ( + ) nausea, ( - ) heartburn, ( - ) change in bowel habits Skin: ( - ) abnormal skin rashes Lymphatics: ( - ) new lymphadenopathy, ( - ) easy bruising Neurological: ( - ) numbness, ( - ) tingling, ( - ) new weaknesses Behavioral/Psych: ( - ) mood change, ( - ) new changes  All other systems were reviewed with the patient and are negative.  PHYSICAL EXAMINATION: ECOG PERFORMANCE STATUS: 1 - Symptomatic but completely ambulatory  Vitals:   03/23/23 0850  BP: (!) 162/75  Pulse: 65  Resp: 17  Temp: 97.7 F (36.5 C)  SpO2: 100%     Filed Weights   03/23/23 0850  Weight: 130 lb 3.2 oz (59.1 kg)     GENERAL: Chronically ill appearing middle-aged Caucasian male, alert, no distress and comfortable SKIN: Dry cracking rash of hands bilaterally.  No issues on the chest, face, or lower extremities.  Skin color, texture, turgor are normal, no  rashes or significant lesions.  EYES: conjunctiva are pink and non-injected, sclera clear LUNGS: clear to auscultation and percussion  with normal breathing effort HEART: regular rate & rhythm and no murmurs and no lower extremity edema  MSK: Bilateral edema of hands, right greater than left. Scabbed area involving left calf with some induration, erythema and edema.  PSYCH: alert & oriented x 3, fluent speech NEURO: no focal motor/sensory deficits   LABORATORY DATA:  I have reviewed the data as listed    Latest Ref Rng & Units 03/23/2023    8:35 AM 02/25/2023    9:37 AM 01/10/2023   11:39 AM  CBC  WBC 4.0 - 10.5 K/uL 5.4  5.5  5.8   Hemoglobin 13.0 - 17.0 g/dL 82.9  56.2  13.0   Hematocrit 39.0 - 52.0 % 35.4  35.3  34.1   Platelets 150 - 400 K/uL 197  267  235        Latest Ref Rng & Units 03/23/2023    8:35 AM 02/25/2023    9:37 AM 01/10/2023   11:39 AM  CMP  Glucose 70 - 99 mg/dL 77  98  865   BUN 8 - 23 mg/dL 12  10  13    Creatinine 0.61 - 1.24 mg/dL 7.84  6.96  2.95   Sodium 135 - 145 mmol/L 139  138  138   Potassium 3.5 - 5.1 mmol/L 3.8  3.6  3.4   Chloride 98 - 111 mmol/L 105  107  106   CO2 22 - 32 mmol/L 28  25  24    Calcium 8.9 - 10.3 mg/dL 9.0  8.5  8.9   Total Protein 6.5 - 8.1 g/dL 6.2  5.8  5.9   Total Bilirubin 0.3 - 1.2 mg/dL 0.5  0.3  0.4   Alkaline Phos 38 - 126 U/L 55  76  63   AST 15 - 41 U/L 16  13  13    ALT 0 - 44 U/L 9  7  8      Lab Results  Component Value Date   MPROTEIN 0.1 (H) 02/25/2023   MPROTEIN 0.2 (H) 01/10/2023   MPROTEIN 0.2 (H) 11/25/2022   Lab Results  Component Value Date   KPAFRELGTCHN 32.0 (H) 02/25/2023   KPAFRELGTCHN 33.9 (H) 01/10/2023   KPAFRELGTCHN 39.4 (H) 11/25/2022   LAMBDASER 7.8 02/25/2023   LAMBDASER 5.9 01/10/2023   LAMBDASER 8.7 11/25/2022   KAPLAMBRATIO 4.10 (H) 02/25/2023   KAPLAMBRATIO 5.75 (H) 01/10/2023   KAPLAMBRATIO 4.53 (H) 11/25/2022    RADIOGRAPHIC STUDIES: No results found.  ASSESSMENT & PLAN Connor Solomon 62 y.o. male with medical history significant for Waldenstrom's macroglobulinemia who presents for a follow up visit.   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  There is no clear evidence of hyperviscosity syndrome at this time.  At this time we will plan to proceed with ibrutinib and rituximab therapy.  One could also consider Bendamustine and rituximab therapy but given the patient's degree of anemia I would prefer pursuing ibrutinib and rituximab at this time.  Additionally ibrutinib/rituximab are category 1 recommendations per the NCCN guidelines.  The treatment will consist of ibrutinib 420 mg p.o. daily with rituximab on weeks 1 through 4 as well as weeks 27 through 30.  Previously we discussed the risks and benefits of therapy including (but not limited to) risk of bleeding, fatigue, atypical infections, and cardiac arrhythmias.  The patient voices understanding of this plan moving forward.   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 --Started second round of weekly rituximab on 12/02/2021. Continued weekly x 4 weeks.   --last rituximab dose held due to missed visits/illness.  Plan: --Labs today were reviewed without any intervention needed.  Labs show white cell count 5.4, hemoglobin 1.2, MCV 86.1, and platelets of 197.  Pending SPEP and sFLC levels but prior levels appear stable. --Currently on ibrutinib 420 mg PO daily.  --RTC in 4 weeks with labs   #Left leg cellulitis --Sent doxycycline 100 mg BID x 10 days  #Rash-improved  markedly -- Affecting hands bilaterally, upper extremities bilaterally, and back.  Not affecting face, chest, or lower extremities. -- Patient has dryness and cracking as well as erythema on the upper extremities. -- Etiology is unclear.  Patient is using hydrating creams but is not effective.  Made referral to dermatology for evaluation, he is established with Dr. Langston Reusing.  # Hypokalemia -- Potassium 3.8, continue to monitor   #Pain Management --Pain was poorly controlled on hydrocodone therapy previously.  --Patient was previously admitted for for opioid overdose with presumed fentanyl --Currently pain regimen includes methadone 10 mg TID and oxycodone 10 mg PRN for breakthrough pain per palliative care.  -- Appreciate recommendations of palliative care.  #Normocytic Anemia  --Hgb 11.2 --previously there was a component of iron deficiency anemia as well.  Currently on PO ferrous sulfate to try and bolster his iron levels.  --Continue to monitor.  #Nausea and vomiting--improving -- Concern that the nausea and vomiting may not be related to the ibrutinib therapy and potentially tied to poor gastric motility due to opioid prescription.  --Unable to tolerate olanzopine so discontinued --Not taking metoclopramide 10 mg every 8 hours  --continue zofran PRN.   #Supportive Care -- chemotherapy education complete -- port placement not required   No orders of the defined types were placed in this encounter.  All questions were answered. The patient knows to call the clinic with any problems, questions or concerns.  I have spent a total of 30 minutes minutes of face-to-face and non-face-to-face time, preparing to see the patient,  performing a medically appropriate examination, counseling and educating the patient, ordering medications/tests, referring and communicating with other health care professionals, documenting clinical information in the electronic health record, and care  coordination.   Ulysees Barns, MD Department of Hematology/Oncology [facility] Phone: [phone] Pager: [phone] Email: [email or web address]    03/23/2023 9:39 AM  Dimopoulos MA, Lennette Bihari, T[place] Joycelyn Rua, Mahe B, Herbaux C, Tam C, Orsucci L, [place] ML, Matous JV, [place] C, Kastritis E, [place], Li J, Salman Z, Graef T, Buske C; iNNOVATE Study Group and the [employer]. Phase 3 Trial of Ibrutinib plus Rituximab in Waldenstrm's Macroglobulinemia. [place] Mis J Med. 2018 Jun 21;378(25):2399-2410.   --At 30 months, the progression-free survival rate was 82% with ibrutinib-rituximab versus 28% with placebo-rituximab (hazard ratio for progression or death, 0.20; P<0.001).

## 2023-03-23 NOTE — Progress Notes (Signed)
Pt seen in clinic c/o nausea, PO zofran given per Lowella Bandy, NP. Pt tolerated well, d/c'ed to lobby in stable condition.

## 2023-03-24 ENCOUNTER — Telehealth: Payer: Self-pay | Admitting: Hematology and Oncology

## 2023-03-24 LAB — KAPPA/LAMBDA LIGHT CHAINS
Kappa free light chain: 27 mg/L — ABNORMAL HIGH (ref 3.3–19.4)
Kappa, lambda light chain ratio: 4.03 — ABNORMAL HIGH (ref 0.26–1.65)
Lambda free light chains: 6.7 mg/L (ref 5.7–26.3)

## 2023-03-25 ENCOUNTER — Other Ambulatory Visit (HOSPITAL_COMMUNITY): Payer: Self-pay

## 2023-03-26 ENCOUNTER — Other Ambulatory Visit (HOSPITAL_COMMUNITY): Payer: Self-pay

## 2023-03-29 ENCOUNTER — Other Ambulatory Visit (HOSPITAL_COMMUNITY): Payer: Self-pay

## 2023-03-29 LAB — MULTIPLE MYELOMA PANEL, SERUM
Albumin SerPl Elph-Mcnc: 3.6 g/dL (ref 2.9–4.4)
Albumin/Glob SerPl: 1.8 — ABNORMAL HIGH (ref 0.7–1.7)
Alpha 1: 0.3 g/dL (ref 0.0–0.4)
Alpha2 Glob SerPl Elph-Mcnc: 0.6 g/dL (ref 0.4–1.0)
B-Globulin SerPl Elph-Mcnc: 0.8 g/dL (ref 0.7–1.3)
Gamma Glob SerPl Elph-Mcnc: 0.5 g/dL (ref 0.4–1.8)
Globulin, Total: 2.1 g/dL — ABNORMAL LOW (ref 2.2–3.9)
IgA: 26 mg/dL — ABNORMAL LOW (ref 61–437)
IgG (Immunoglobin G), Serum: 559 mg/dL — ABNORMAL LOW (ref 603–1613)
IgM (Immunoglobulin M), Srm: 114 mg/dL (ref 20–172)
M Protein SerPl Elph-Mcnc: 0.1 g/dL — ABNORMAL HIGH
Total Protein ELP: 5.7 g/dL — ABNORMAL LOW (ref 6.0–8.5)

## 2023-04-04 ENCOUNTER — Other Ambulatory Visit (HOSPITAL_COMMUNITY): Payer: Self-pay

## 2023-04-04 ENCOUNTER — Other Ambulatory Visit: Payer: Self-pay | Admitting: Nurse Practitioner

## 2023-04-04 DIAGNOSIS — G893 Neoplasm related pain (acute) (chronic): Secondary | ICD-10-CM

## 2023-04-04 DIAGNOSIS — Z515 Encounter for palliative care: Secondary | ICD-10-CM

## 2023-04-04 DIAGNOSIS — C880 Waldenstrom macroglobulinemia: Secondary | ICD-10-CM

## 2023-04-04 MED ORDER — OXYCODONE HCL 10 MG PO TABS
10.0000 mg | ORAL_TABLET | ORAL | 0 refills | Status: DC | PRN
Start: 2023-04-07 — End: 2023-04-16
  Filled 2023-04-07: qty 90, 15d supply, fill #0
  Filled ????-??-??: fill #0

## 2023-04-06 ENCOUNTER — Other Ambulatory Visit (HOSPITAL_COMMUNITY): Payer: Self-pay

## 2023-04-06 ENCOUNTER — Telehealth: Payer: Self-pay

## 2023-04-06 NOTE — Telephone Encounter (Signed)
Pt called and LVM asking how early he could pick up his medication since he is going out of town. RN called pharmacy and confirmed he can pick up 6/27 at 730am, RN called back, no answer, LVM

## 2023-04-07 ENCOUNTER — Other Ambulatory Visit (HOSPITAL_COMMUNITY): Payer: Self-pay

## 2023-04-07 ENCOUNTER — Other Ambulatory Visit: Payer: Self-pay

## 2023-04-11 DIAGNOSIS — Z419 Encounter for procedure for purposes other than remedying health state, unspecified: Secondary | ICD-10-CM | POA: Diagnosis not present

## 2023-04-16 ENCOUNTER — Other Ambulatory Visit: Payer: Self-pay | Admitting: Nurse Practitioner

## 2023-04-16 DIAGNOSIS — C880 Waldenstrom macroglobulinemia: Secondary | ICD-10-CM

## 2023-04-16 DIAGNOSIS — Z515 Encounter for palliative care: Secondary | ICD-10-CM

## 2023-04-16 DIAGNOSIS — G4700 Insomnia, unspecified: Secondary | ICD-10-CM

## 2023-04-16 DIAGNOSIS — G893 Neoplasm related pain (acute) (chronic): Secondary | ICD-10-CM

## 2023-04-18 ENCOUNTER — Other Ambulatory Visit (HOSPITAL_COMMUNITY): Payer: Self-pay

## 2023-04-18 MED ORDER — OXYCODONE HCL 10 MG PO TABS
10.0000 mg | ORAL_TABLET | ORAL | 0 refills | Status: DC | PRN
Start: 2023-04-21 — End: 2023-04-20
  Filled ????-??-??: fill #0

## 2023-04-18 MED ORDER — DIAZEPAM 5 MG PO TABS
5.0000 mg | ORAL_TABLET | Freq: Two times a day (BID) | ORAL | 0 refills | Status: DC | PRN
Start: 2023-04-21 — End: 2023-05-19
  Filled 2023-04-18: qty 60, 30d supply, fill #0

## 2023-04-19 ENCOUNTER — Other Ambulatory Visit: Payer: Self-pay | Admitting: Physician Assistant

## 2023-04-19 DIAGNOSIS — C880 Waldenstrom macroglobulinemia: Secondary | ICD-10-CM

## 2023-04-19 NOTE — Progress Notes (Signed)
Palliative Medicine [facility]  [phone] [phone]   Name: Connor Solomon Date: 04/19/2023 MRN: [id number]  DOB: 1960-11-24  Patient Care Team: [name], DO as PCP - General Pickenpack-Cousar, [name], NP as Nurse Practitioner (Nurse Practitioner)   REASON FOR CONSULTATION: KHAMAR STANDARD is a 62 y.o. 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 Palliative ask to see for symptom management.  SOCIAL HISTORY:     reports that he quit smoking about 2 years ago. His smoking use included cigars. He has never used smokeless tobacco. He reports that he does not drink alcohol and does not use drugs.  ADVANCE DIRECTIVES:    CODE STATUS:   PAST MEDICAL HISTORY: Past Medical History:  Diagnosis Date   Acute heart failure (HCC) 04/08/2021   Acute HFrEF (heart failure with reduced ejection fraction) (HCC) 04/07/2021   Arthritis    hands, elbows bilaterally   Cancer (HCC)    Full dentures    Hypertension    Pt states he doen not have HTN and has never been treated for HTN   Kidney stone on left side last stone 4-5 yrs ago   recurrent (3 episodes)   Panic disorder    pt states has resolved   Syncope    resolved- was related to panic attacks    PAST SURGICAL HISTORY:  Past Surgical History:  Procedure Laterality Date   CARPAL METACARPAL FUSION WITH DISTAL RADIAL BONE GRAFT Left 05/30/2013   Procedure: LEFT THUMB METACARPAL JOINT FUSION;  Surgeon: [name], MD;  Location: [facility];  Service: Orthopedics;  Laterality: Left;   ESOPHAGOGASTRODUODENOSCOPY N/A 02/12/2014   Procedure: ESOPHAGOGASTRODUODENOSCOPY (EGD);  Surgeon: [name], MD;  Location: [facility] ENDOSCOPY;  Service: Endoscopy;  Laterality: N/A;   FOREIGN BODY REMOVAL N/A 02/12/2014   Procedure: FOREIGN BODY REMOVAL;  Surgeon: [name], MD;  Location: [facility]  ENDOSCOPY;  Service: Endoscopy;  Laterality: N/A;   OPEN REDUCTION INTERNAL FIXATION (ORIF) DISTAL RADIAL FRACTURE Left 11/29/2012   Procedure: OPEN REDUCTION INTERNAL FIXATION (ORIF) DISTAL RADIAL FRACTURE;  Surgeon: [name], MD;  Location: [facility];  Service: Orthopedics;  Laterality: Left;  LEFT DISTAL RADIUS OSTEOTOMY WITH BONE GRAFT   RIGHT/LEFT HEART CATH AND CORONARY ANGIOGRAPHY N/A 04/09/2021   Procedure: RIGHT/LEFT HEART CATH AND CORONARY ANGIOGRAPHY;  Surgeon: [name], MD;  Location: [facility] INVASIVE CV [facility];  Service: Cardiovascular;  Laterality: N/A;   TENDON REPAIR Left 02/07/2013   Procedure: LEFT EXTENSOR INDICIS PROPRIUS  TO EXTENSOR POLLICIS LONGUS TENDON TRANSFER;  Surgeon: [name], MD;  Location: [facility];  Service: Orthopedics;  Laterality: Left;   WISDOM TOOTH EXTRACTION     WRIST SURGERY     fx only    HEMATOLOGY/ONCOLOGY HISTORY:  Oncology History  Waldenstrom macroglobulinemia (HCC)  05/24/2021 Initial Diagnosis   Waldenstrom macroglobulinemia (HCC)   06/05/2021 - 12/30/2021 Chemotherapy   Patient is on Treatment Plan : NON-HODGKINS LYMPHOMA Rituximab q7d       ALLERGIES:  is allergic to rituxan [rituximab] and hydrocodone.  MEDICATIONS:  Current Outpatient Medications  Medication Sig Dispense Refill   acetaminophen (TYLENOL) 500 MG tablet Take 500 mg by mouth every 6 (six) hours as needed. (Patient not taking: Reported on 01/10/2023)     albuterol (PROVENTIL HFA) 108 (90 Base) MCG/ACT inhaler Inhale 2 puffs into the lungs every 6 (six) hours  as needed for wheezing or shortness of breath 6.7 g 2   carvedilol (COREG) 3.125 MG tablet Take 1 tablet by mouth 2 times daily. 60 tablet 6   clobetasol cream (TEMOVATE) 0.05 % Apply 1 Application topically 2 (two) times daily. Apply topically to the arms and hands twice daily for up to two weeks 30 g 2   dapagliflozin propanediol (FARXIGA) 10 MG TABS tablet Take 1  tablet (10 mg total) by mouth daily before breakfast. 30 tablet 11   [START ON 04/21/2023] diazepam (VALIUM) 5 MG tablet Take 1 tablet (5 mg) by mouth every 12 hours as needed for anxiety. 60 tablet 0   dronabinol (MARINOL) 5 MG capsule Take 1 capsule (5 mg total) by mouth 2 (two) times daily before a meal. 60 capsule 0   eplerenone (INSPRA) 25 MG tablet Take 0.5 tablets (12.5 mg total) by mouth daily. 15 tablet 3   ferrous sulfate (FEROSUL) 325 (65 FE) MG tablet Take 1 tablet by mouth daily with breakfast. Please take with a source of Vitamin C 90 tablet 3   ibrutinib (IMBRUVICA) 420 MG tablet Take 1 tablet (420 mg total) by mouth daily. 28 tablet 2   methadone (DOLOPHINE) 10 MG tablet Take 1 tablet (10 mg) by mouth every 8 hours. 90 tablet 0   naloxone (NARCAN) nasal spray 4 mg/0.1 mL Take for overdose 1 each 0   naloxone (NARCAN) nasal spray 4 mg/0.1 mL Take for overdose as directed (Patient not taking: Reported on 01/10/2023) 2 each 0   ondansetron (ZOFRAN) 8 MG tablet Take 1 tablet (8 mg) by mouth every 8 hours as needed. 30 tablet 0   [START ON 04/21/2023] Oxycodone HCl 10 MG TABS Take 1 tablet (10 mg) by mouth every 4 hours as needed. 90 tablet 0   potassium chloride SA (KLOR-CON M) 20 MEQ tablet Take 1 tablet (20 mEq total) by mouth daily. 30 tablet 1   sacubitril-valsartan (ENTRESTO) 24-26 MG Take 1 tablet by mouth 2 (two) times daily. 180 tablet 3   No current facility-administered medications for this visit.    VITAL SIGNS: There were no vitals taken for this visit. There were no vitals filed for this visit.     Estimated body mass index is 19.8 kg/m as calculated from the following:   Height as of 09/01/22: 5\' 8"  (1.727 m).   Weight as of 03/23/23: 130 lb 3.2 oz (59.1 kg).   PERFORMANCE STATUS (ECOG) : 1 - Symptomatic but completely ambulatory  Assessment NAD RRR Normal breathing pattern  AAO X4  IMPRESSION:  [name] presented to clinic for ongoing symptom management.  No acute distress. Denies nausea, vomiting, constipation, or diarrhea. Endorses ongoing fatigue. Is trying to remain as active as possible. Recent fall in the home. Has assistive device however was not using during the time of fall.   Neoplasm related pain Kahmani reports overall pain is well controlled. Some days are better than others.   We discussed regimen:Methadone 10mg  three times daily. Oxycodone 10mg  as needed for breakthrough pain. Taking as prescribed. Reill request within appropriate timing.   We will continue close monitoring and adjust as needed.  No changes to current regimen at this time.  Constipation Controlled with diet and daily MiraLAX.  Appetite/decreased appetite.  Much improved. Weight  132lbs up from 130lbs on 6/12, 129lbs on 5/17.  4. Anxiety/Anxiousness  Wife has expressed concerns regarding some component of depression. Mr. Bellew shares that he may have some depression however feels  that working outside in the warm temperatures and just overall fatigue from his cancer is playing a large roll in how he feels.   He was previously referred to social work and Orthoptist for spiritual support in the past however was never able to connect due to missed visits or not feeling well. Encouraged to reconnect.   We also dicussed previous use of Lexapro however he declines to restart.   Anxiety controlled with Valium.   5. Goals of care   I created space and opportunity for Mr. Tramell to share his thoughts and feelings regarding his current health and condition. He is realistic speaking to his understanding of Christian faith. He is taking things one day at a time preparing himself for when his time comes. Until then he wishes to continue treating the treatable allowing himself every opportunity to thrive. He knows at anytime he can discuss with his medical team his wishes and focus on his comfort.   (5/16) Mr. Jaroszewski shares his realistic understanding of his condition. He and his  wife are making sure all "affairs" are in order as his biggest worry is leaving his wife in a financial constraint. Reports communicating with his life insurance policy etc. Emotional support provided.    He is emotional expressing racing thoughts during idol time or interruptions in his sleep as his mind wonders thinking about worst case scenario. Is requesting something to assist with sleeping and anxiety. Acknowledged his request. Education provided on limited use in collaboration with other medications including zyprexa. He verbalized understanding and aware  we will continue to support and monitor for needs.      PLAN: Methadone 10 mg every 8 hours. Close monitoring. Tolerating well. Pain controlled. Oxycodone 10 mg as needed for breakthrough pain.  Miralax daily for constipation Valium 5mg  at twice daily. Zofran as needed for nausea as prescribed. Zofran dose while in office. Marinol for appetite has been on Scientist, clinical (histocompatibility and immunogenetics). Unable to get at this time.  I will plan to see patient back in 4-6 weeks in collaboration to other oncology appointments.    Patient expressed understanding and was in agreement with this plan. He also understands that He can call the clinic at any time with any questions, concerns, or complaints.    Any controlled substances utilized were prescribed in the context of palliative care. PDMP has been reviewed.    Visit consisted of counseling and education dealing with the complex and emotionally intense issues of symptom management and palliative care in the setting of serious and potentially life-threatening illness.Greater than 50%  of this time was spent counseling and coordinating care related to the above assessment and plan.  Willette Alma, AGPCNP-BC  Palliative Medicine Team/[facility]  *Please note that this is a verbal dictation therefore any spelling or grammatical errors are due to the "[employer]" system  interpretation.

## 2023-04-20 ENCOUNTER — Inpatient Hospital Stay: Payer: Medicaid Other | Attending: Hematology and Oncology

## 2023-04-20 ENCOUNTER — Other Ambulatory Visit (HOSPITAL_COMMUNITY): Payer: Self-pay

## 2023-04-20 ENCOUNTER — Other Ambulatory Visit: Payer: Self-pay

## 2023-04-20 ENCOUNTER — Encounter: Payer: Self-pay | Admitting: Nurse Practitioner

## 2023-04-20 ENCOUNTER — Inpatient Hospital Stay (HOSPITAL_BASED_OUTPATIENT_CLINIC_OR_DEPARTMENT_OTHER): Payer: Medicaid Other | Admitting: Physician Assistant

## 2023-04-20 ENCOUNTER — Inpatient Hospital Stay (HOSPITAL_BASED_OUTPATIENT_CLINIC_OR_DEPARTMENT_OTHER): Payer: Medicaid Other | Admitting: Nurse Practitioner

## 2023-04-20 VITALS — BP 131/82 | HR 80 | Temp 98.1°F | Resp 18 | Wt 132.0 lb

## 2023-04-20 DIAGNOSIS — D649 Anemia, unspecified: Secondary | ICD-10-CM | POA: Insufficient documentation

## 2023-04-20 DIAGNOSIS — Z5986 Financial insecurity: Secondary | ICD-10-CM | POA: Insufficient documentation

## 2023-04-20 DIAGNOSIS — D472 Monoclonal gammopathy: Secondary | ICD-10-CM | POA: Diagnosis not present

## 2023-04-20 DIAGNOSIS — E876 Hypokalemia: Secondary | ICD-10-CM | POA: Diagnosis not present

## 2023-04-20 DIAGNOSIS — Z885 Allergy status to narcotic agent status: Secondary | ICD-10-CM | POA: Diagnosis not present

## 2023-04-20 DIAGNOSIS — R112 Nausea with vomiting, unspecified: Secondary | ICD-10-CM | POA: Diagnosis not present

## 2023-04-20 DIAGNOSIS — F419 Anxiety disorder, unspecified: Secondary | ICD-10-CM | POA: Insufficient documentation

## 2023-04-20 DIAGNOSIS — C880 Waldenstrom macroglobulinemia: Secondary | ICD-10-CM | POA: Diagnosis not present

## 2023-04-20 DIAGNOSIS — G893 Neoplasm related pain (acute) (chronic): Secondary | ICD-10-CM | POA: Diagnosis not present

## 2023-04-20 DIAGNOSIS — R590 Localized enlarged lymph nodes: Secondary | ICD-10-CM | POA: Insufficient documentation

## 2023-04-20 DIAGNOSIS — R21 Rash and other nonspecific skin eruption: Secondary | ICD-10-CM | POA: Diagnosis not present

## 2023-04-20 DIAGNOSIS — F32A Depression, unspecified: Secondary | ICD-10-CM | POA: Insufficient documentation

## 2023-04-20 DIAGNOSIS — Z8249 Family history of ischemic heart disease and other diseases of the circulatory system: Secondary | ICD-10-CM | POA: Diagnosis not present

## 2023-04-20 DIAGNOSIS — R5383 Other fatigue: Secondary | ICD-10-CM | POA: Insufficient documentation

## 2023-04-20 DIAGNOSIS — Z79899 Other long term (current) drug therapy: Secondary | ICD-10-CM | POA: Insufficient documentation

## 2023-04-20 DIAGNOSIS — R530 Neoplastic (malignant) related fatigue: Secondary | ICD-10-CM

## 2023-04-20 DIAGNOSIS — Z87891 Personal history of nicotine dependence: Secondary | ICD-10-CM | POA: Diagnosis not present

## 2023-04-20 DIAGNOSIS — Z7189 Other specified counseling: Secondary | ICD-10-CM | POA: Diagnosis not present

## 2023-04-20 DIAGNOSIS — Z515 Encounter for palliative care: Secondary | ICD-10-CM | POA: Diagnosis not present

## 2023-04-20 LAB — CMP (CANCER CENTER ONLY)
ALT: 7 U/L (ref 0–44)
AST: 16 U/L (ref 15–41)
Albumin: 3.9 g/dL (ref 3.5–5.0)
Alkaline Phosphatase: 68 U/L (ref 38–126)
Anion gap: 6 (ref 5–15)
BUN: 15 mg/dL (ref 8–23)
CO2: 29 mmol/L (ref 22–32)
Calcium: 9.1 mg/dL (ref 8.9–10.3)
Chloride: 103 mmol/L (ref 98–111)
Creatinine: 1.04 mg/dL (ref 0.61–1.24)
GFR, Estimated: >60 mL/min (ref >60)
Glucose, Bld: 127 mg/dL — ABNORMAL HIGH (ref 70–99)
Potassium: 3.8 mmol/L (ref 3.5–5.1)
Sodium: 138 mmol/L (ref 135–145)
Total Bilirubin: 0.6 mg/dL (ref 0.3–1.2)
Total Protein: 6.4 g/dL — ABNORMAL LOW (ref 6.5–8.1)

## 2023-04-20 LAB — CBC WITH DIFFERENTIAL (CANCER CENTER ONLY)
Abs Immature Granulocytes: 0.02 10*3/uL (ref 0.00–0.07)
Basophils Absolute: 0.1 10*3/uL (ref 0.0–0.1)
Basophils Relative: 1 %
Eosinophils Absolute: 0.5 10*3/uL (ref 0.0–0.5)
Eosinophils Relative: 10 %
HCT: 38.5 % — ABNORMAL LOW (ref 39.0–52.0)
Hemoglobin: 12.8 g/dL — ABNORMAL LOW (ref 13.0–17.0)
Immature Granulocytes: 0 %
Lymphocytes Relative: 26 %
Lymphs Abs: 1.4 10*3/uL (ref 0.7–4.0)
MCH: 28.6 pg (ref 26.0–34.0)
MCHC: 33.2 g/dL (ref 30.0–36.0)
MCV: 86.1 fL (ref 80.0–100.0)
Monocytes Absolute: 0.6 10*3/uL (ref 0.1–1.0)
Monocytes Relative: 11 %
Neutro Abs: 2.9 10*3/uL (ref 1.7–7.7)
Neutrophils Relative %: 52 %
Platelet Count: 233 10*3/uL (ref 150–400)
RBC: 4.47 MIL/uL (ref 4.22–5.81)
RDW: 14.3 % (ref 11.5–15.5)
WBC Count: 5.5 10*3/uL (ref 4.0–10.5)
nRBC: 0 % (ref 0.0–0.2)

## 2023-04-20 LAB — LACTATE DEHYDROGENASE: LDH: 159 U/L (ref 98–192)

## 2023-04-20 MED ORDER — OXYCODONE HCL 10 MG PO TABS
10.0000 mg | ORAL_TABLET | ORAL | 0 refills | Status: DC | PRN
Start: 2023-04-20 — End: 2023-05-09
  Filled 2023-04-20: qty 90, 15d supply, fill #0

## 2023-04-20 MED ORDER — OXYCODONE HCL 10 MG PO TABS
10.0000 mg | ORAL_TABLET | ORAL | 0 refills | Status: DC | PRN
Start: 2023-04-21 — End: 2023-04-20
  Filled ????-??-??: fill #0

## 2023-04-20 NOTE — Progress Notes (Signed)
[facility] [phone]   [phone]  PROGRESS NOTE  Patient Care Team: [name], DO as PCP - General Pickenpack-Cousar, [name], NP as Nurse Practitioner (Nurse Practitioner)  Hematological/Oncological History # Waldenstrom's Macroglobulinemia # IgM Monoclonal Gammopathy 04/07/2021: CT A/p showed mildly enlarged retroperitoneal lymph nodes and bilateral inguinal lymph nodes.  04/08/2021: IgM Kappa M protein 1.2 04/09/2021: WBC 11.1, Hgb 9.5, MCV 75.4, Plt 525 04/20/2021: Kappa 977, Lambda 7.8, K/L ratio 125.28. Serum viscosity 1.8 05/06/2021: Establish care with Dr. 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 Infusion reaction with ritux, held halfway through.  06/12/2021: Cycle 2 Day 1 of Ibrutinib + Rituximab 06/19/2021: Cycle 3 Day 1 of Ibrutinib + Rituximab 06/26/2021: Cycle 4 Day 1 of rituximab. Continued on daily ibrutinib 12/02/2021: Cycle 5 Day 1 of Ibrutinib + Rituximab 12/09/2021: Cycle 6 Day 1 of Ibrutinib + Rituximab 12/16/2021: Cycle 7 Day 1 of Ibrutinib + Rituximab 12/23/2021: Held Cycle 8 of Ritxumab due to nausea/vomiting, elevated creatinine, finger infection  Interval History:  Connor Solomon 62 y.o. male with medical history significant for Waldenstrom's macroglobulinemia who presents for a follow up visit. The patient's last visit was on 03/23/23. In the interim since the last visit he has continued on Ibrutinib therapy.   On exam today Connor Solomon reports he is having more fatigued but suspects it is related to his depression. He adds that his depression is getting worse. He feels the Valium has improved his anxiety and he is able to calm down at bedtime. He is trying to eat consistently to maintain his weight. He denies nausea, vomiting or abdominal  pain. His bowel habits are unchanged without recurrent episodes of diarrhea or constipation. He reports that he feels more unsteady and his knees give out resulting in falls. He has both a cane and walker at home but admits his walker gives him more stability.   Overall he is willing and able to proceed with ibrutinib therapy. He is tolerating ibrutinib therapy at this time.  He denies fevers, chills, night sweats, shortness of breath, chest pain or cough. He has no other complaints. Full 10 point ROS is listed below.   MEDICAL HISTORY:  Past Medical History:  Diagnosis Date   Acute heart failure (HCC) 04/08/2021   Acute HFrEF (heart failure with reduced ejection fraction) (HCC) 04/07/2021   Arthritis    hands, elbows bilaterally   Cancer (HCC)    Full dentures    Hypertension    Pt states he doen not have HTN and has never been treated for HTN   Kidney stone on left side last stone 4-5 yrs ago   recurrent (3 episodes)   Panic disorder    pt states has resolved   Syncope    resolved- was related to panic attacks    SURGICAL HISTORY: Past Surgical History:  Procedure Laterality Date   CARPAL METACARPAL FUSION WITH DISTAL RADIAL BONE GRAFT Left 05/30/2013   Procedure: LEFT THUMB METACARPAL JOINT FUSION;  Surgeon: [name], MD;  Location: [facility];  Service: Orthopedics;  Laterality: Left;   ESOPHAGOGASTRODUODENOSCOPY N/A 02/12/2014   Procedure: ESOPHAGOGASTRODUODENOSCOPY (EGD);  Surgeon: [name], MD;  Location: [facility];  Service: Endoscopy;  Laterality: N/A;   FOREIGN BODY REMOVAL N/A 02/12/2014   Procedure: FOREIGN BODY REMOVAL;  Surgeon: [name], MD;  Location: [facility];  Service: Endoscopy;  Laterality: N/A;   OPEN REDUCTION INTERNAL FIXATION (ORIF) DISTAL RADIAL FRACTURE Left 11/29/2012   Procedure: OPEN REDUCTION INTERNAL FIXATION (ORIF) DISTAL RADIAL FRACTURE;  Surgeon: [name], MD;  Location: [facility];  Service:  Orthopedics;  Laterality: Left;  LEFT DISTAL RADIUS OSTEOTOMY WITH BONE GRAFT   RIGHT/LEFT HEART CATH AND CORONARY ANGIOGRAPHY N/A 04/09/2021   Procedure: RIGHT/LEFT HEART CATH AND CORONARY ANGIOGRAPHY;  Surgeon: [name], MD;  Location: [facility];  Service: Cardiovascular;  Laterality: N/A;   TENDON REPAIR Left 02/07/2013   Procedure: LEFT EXTENSOR INDICIS PROPRIUS  TO EXTENSOR POLLICIS LONGUS TENDON TRANSFER;  Surgeon: [name], MD;  Location: [facility];  Service: Orthopedics;  Laterality: Left;   WISDOM TOOTH EXTRACTION     WRIST SURGERY     fx only    SOCIAL HISTORY: Social History   Socioeconomic History   Marital status: Single    Spouse name: Not on file   Number of children: Not on file   Years of education: Not on file   Highest education level: Not on file  Occupational History   Not on file  Tobacco Use   Smoking status: Former    Types: Cigars    Quit date: 10/11/2020    Years since quitting: 2.5   Smokeless tobacco: Never   Tobacco comments:    smokes 1 cigar a week  Vaping Use   Vaping Use: Never used  Substance and Sexual Activity   Alcohol use: No   Drug use: No   Sexual activity: Not on file  Other Topics Concern   Not on file  Social History Narrative   Not on file   Social Determinants of Health   Financial Resource Strain: High Risk (07/17/2021)   Overall Financial Resource Strain (CARDIA)    Difficulty of Paying Living Expenses: Very hard  Food Insecurity: Food Insecurity Present (04/10/2021)   Hunger Vital Sign    Worried About Running Out of Food in the Last Year: Sometimes true    Ran Out of Food in the Last Year: Sometimes true  Transportation Needs: No Transportation Needs (04/10/2021)   PRAPARE - Administrator, Civil Service (Medical): No    Lack of Transportation (Non-Medical): No  Physical Activity: Not on file  Stress: Stress Concern Present (09/01/2021)   [employer] of  Occupational Health - Occupational Stress Questionnaire    Feeling of Stress : Very much  Social Connections: Not on file  Intimate Partner Violence: Not on file    FAMILY HISTORY: Family History  Problem Relation Age of Onset   Heart attack Father    Sudden Cardiac Death Father 79   Diabetes Neg Hx    Hyperlipidemia Neg Hx    Hypertension Neg Hx     ALLERGIES:  is allergic to rituxan [rituximab] and hydrocodone.  MEDICATIONS:  Current Outpatient Medications  Medication Sig Dispense Refill   albuterol (PROVENTIL HFA) 108 (90 Base) MCG/ACT inhaler Inhale 2 puffs into the lungs every 6 (six) hours as needed for wheezing or shortness of breath 6.7 g 2   carvedilol (COREG) 3.125 MG tablet Take 1 tablet by mouth 2 times daily. 60 tablet 6   clobetasol cream (TEMOVATE) 0.05 % Apply 1 Application topically 2 (two) times daily. Apply topically to the arms and hands twice daily for up to two weeks 30 g 2   dapagliflozin propanediol (FARXIGA) 10 MG TABS  tablet Take 1 tablet (10 mg total) by mouth daily before breakfast. 30 tablet 11   [START ON 04/21/2023] diazepam (VALIUM) 5 MG tablet Take 1 tablet (5 mg) by mouth every 12 hours as needed for anxiety. 60 tablet 0   dronabinol (MARINOL) 5 MG capsule Take 1 capsule (5 mg total) by mouth 2 (two) times daily before a meal. 60 capsule 0   eplerenone (INSPRA) 25 MG tablet Take 0.5 tablets (12.5 mg total) by mouth daily. 15 tablet 3   ferrous sulfate (FEROSUL) 325 (65 FE) MG tablet Take 1 tablet by mouth daily with breakfast. Please take with a source of Vitamin C 90 tablet 3   ibrutinib (IMBRUVICA) 420 MG tablet Take 1 tablet (420 mg total) by mouth daily. 28 tablet 2   methadone (DOLOPHINE) 10 MG tablet Take 1 tablet (10 mg) by mouth every 8 hours. 90 tablet 0   naloxone (NARCAN) nasal spray 4 mg/0.1 mL Take for overdose 1 each 0   ondansetron (ZOFRAN) 8 MG tablet Take 1 tablet (8 mg) by mouth every 8 hours as needed. 30 tablet 0   [START ON  04/21/2023] Oxycodone HCl 10 MG TABS Take 1 tablet (10 mg) by mouth every 4 hours as needed. 90 tablet 0   potassium chloride SA (KLOR-CON M) 20 MEQ tablet Take 1 tablet (20 mEq total) by mouth daily. 30 tablet 1   sacubitril-valsartan (ENTRESTO) 24-26 MG Take 1 tablet by mouth 2 (two) times daily. 180 tablet 3   acetaminophen (TYLENOL) 500 MG tablet Take 500 mg by mouth every 6 (six) hours as needed. (Patient not taking: Reported on 01/10/2023)     naloxone [facility]) nasal spray 4 mg/0.1 mL Take for overdose as directed (Patient not taking: Reported on 01/10/2023) 2 each 0   No current facility-administered medications for this visit.    REVIEW OF SYSTEMS:   Constitutional: ( - ) fevers, ( - )  chills , ( - ) night sweats Eyes: ( - ) blurriness of vision, ( - ) double vision, ( - ) watery eyes Ears, nose, mouth, throat, and face: ( - ) mucositis, ( - ) sore throat Respiratory: ( - ) cough, ( - ) dyspnea, ( - ) wheezes Cardiovascular: ( - ) palpitation, ( - ) chest discomfort, ( - ) lower extremity swelling Gastrointestinal:  (- ) nausea, ( - ) heartburn, ( - ) change in bowel habits Skin: ( - ) abnormal skin rashes Lymphatics: ( - ) new lymphadenopathy, ( - ) easy bruising Neurological: ( - ) numbness, ( - ) tingling, ( - ) new weaknesses Behavioral/Psych: ( - ) mood change, ( - ) new changes  All other systems were reviewed with the patient and are negative.  PHYSICAL EXAMINATION: ECOG PERFORMANCE STATUS: 2 - Symptomatic, <50% confined to bed  Vitals:   04/20/23 1035  BP: 131/82  Pulse: 80  Resp: 18  Temp: 98.1 F (36.7 C)  SpO2: 100%     Filed Weights   04/20/23 1035  Weight: 132 lb (59.9 kg)     GENERAL: Chronically ill appearing middle-aged Caucasian male, alert, no distress and comfortable SKIN: Dry cracking rash of hands bilaterally.  No issues on the chest, face, or lower extremities.  Skin color, texture, turgor are normal, no rashes or significant lesions.  EYES:  conjunctiva are pink and non-injected, sclera clear LUNGS: clear to auscultation and percussion with normal breathing effort HEART: regular rate & rhythm and no murmurs and no lower  extremity edema  MSK: Bilateral edema of hands.  PSYCH: alert & oriented x 3, fluent speech NEURO: no focal motor/sensory deficits   LABORATORY DATA:  I have reviewed the data as listed    Latest Ref Rng & Units 04/20/2023   10:26 AM 03/23/2023    8:35 AM 02/25/2023    9:37 AM  CBC  WBC 4.0 - 10.5 K/uL 5.5  5.4  5.5   Hemoglobin 13.0 - 17.0 g/dL 01.0  27.2  53.6   Hematocrit 39.0 - 52.0 % 38.5  35.4  35.3   Platelets 150 - 400 K/uL 233  197  267        Latest Ref Rng & Units 03/23/2023    8:35 AM 02/25/2023    9:37 AM 01/10/2023   11:39 AM  CMP  Glucose 70 - 99 mg/dL 77  98  644   BUN 8 - 23 mg/dL 12  10  13    Creatinine 0.61 - 1.24 mg/dL 0.34  7.42  5.95   Sodium 135 - 145 mmol/L 139  138  138   Potassium 3.5 - 5.1 mmol/L 3.8  3.6  3.4   Chloride 98 - 111 mmol/L 105  107  106   CO2 22 - 32 mmol/L 28  25  24    Calcium 8.9 - 10.3 mg/dL 9.0  8.5  8.9   Total Protein 6.5 - 8.1 g/dL 6.2  5.8  5.9   Total Bilirubin 0.3 - 1.2 mg/dL 0.5  0.3  0.4   Alkaline Phos 38 - 126 U/L 55  76  63   AST 15 - 41 U/L 16  13  13    ALT 0 - 44 U/L 9  7  8      Lab Results  Component Value Date   MPROTEIN 0.1 (H) 03/23/2023   MPROTEIN 0.1 (H) 02/25/2023   MPROTEIN 0.2 (H) 01/10/2023   Lab Results  Component Value Date   KPAFRELGTCHN 27.0 (H) 03/23/2023   KPAFRELGTCHN 32.0 (H) 02/25/2023   KPAFRELGTCHN 33.9 (H) 01/10/2023   LAMBDASER 6.7 03/23/2023   LAMBDASER 7.8 02/25/2023   LAMBDASER 5.9 01/10/2023   KAPLAMBRATIO 4.03 (H) 03/23/2023   KAPLAMBRATIO 4.10 (H) 02/25/2023   KAPLAMBRATIO 5.75 (H) 01/10/2023    RADIOGRAPHIC STUDIES: No results found.  ASSESSMENT & PLAN Connor Solomon 62 y.o. male with medical history significant for Waldenstrom's macroglobulinemia who presents for a follow up visit.   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  There is no clear evidence of hyperviscosity syndrome at this time.  At this time we will plan to proceed with ibrutinib and rituximab therapy.  One could also consider Bendamustine and rituximab therapy but given the patient's degree of anemia I would prefer pursuing ibrutinib and rituximab at this time.  Additionally ibrutinib/rituximab are category 1 recommendations per the NCCN guidelines.  The treatment will consist of ibrutinib 420 mg p.o. daily with rituximab on weeks 1 through 4 as well as weeks 27 through 30.  Previously we discussed the risks and benefits of therapy including (but not limited to) risk of bleeding, fatigue, atypical infections, and cardiac arrhythmias.  The patient voices understanding of this plan moving forward.   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  This is confirmed with an IgM monoclonal gammopathy and bone marrow biopsy  results showing a lymphoplasmacytic lymphoma --No clear signs of hyperviscosity syndrome at this time. --Started second round of weekly rituximab on 12/02/2021. Continued weekly x 4 weeks.   --last rituximab dose held due to missed visits/illness.  Plan: --Labs today were reviewed without any intervention needed.  Labs show white cell count 5.5, hemoglobin 12.8, MCV 86.1, and platelets of 233.  Pending SPEP and sFLC levels but prior levels appear stable. --Currently on ibrutinib 420 mg PO daily.  --RTC in 4 weeks with labs   #Rash-improved markedly -- Affecting hands bilaterally, upper extremities bilaterally, and back.  Not affecting face, chest, or lower extremities. -- Patient has dryness and cracking as well as erythema on the upper  extremities. -- Etiology is unclear.  Patient is using hydrating creams but is not effective.  Made referral to dermatology for evaluation, he is established with Dr. Langston Reusing.  # Hypokalemia -- Potassium 3.8, continue to monitor   #Pain Management --Pain was poorly controlled on hydrocodone therapy previously.  --Patient was previously admitted for for opioid overdose with presumed fentanyl --Currently pain regimen includes methadone 10 mg TID and oxycodone 10 mg PRN for breakthrough pain per palliative care.  -- Appreciate recommendations of palliative care.  #Normocytic Anemia  --Hgb 12.8 --previously there was a component of iron deficiency anemia as well.  Currently on PO ferrous sulfate. --Continue to monitor.  #Nausea and vomiting--improved -- Concern that the nausea and vomiting may not be related to the ibrutinib therapy and potentially tied to poor gastric motility due to opioid prescription.  --Unable to tolerate olanzopine so discontinued --Not taking metoclopramide 10 mg every 8 hours  --continue zofran PRN.   #Fatigue #Anxiety/Depression: --Fatigue and depression have worsened recently --Anxiety has improved with Valium  --Will request further evaluation by palliative care team, scheduled for next visit today  #Supportive Care -- chemotherapy education complete -- port placement not required   No orders of the defined types were placed in this encounter.  All questions were answered. The patient knows to call the clinic with any problems, questions or concerns.  I have spent a total of 30 minutes minutes of face-to-face and non-face-to-face time, preparing to see the patient,  performing a medically appropriate examination, counseling and educating the patient, ordering medications/tests, referring and communicating with other health care professionals, documenting clinical information in the electronic health record, and care coordination.   Georga Kaufmann  PA-C Dept of Hematology and Oncology [facility] Phone: [phone]     04/20/2023 10:51 AM  Dimopoulos MA, Lennette Bihari, Joaquin Courts, Mahe B, Herbaux C, Tam C, Orsucci L, Palomba ML, Matous JV, McCracken C, Kastritis E, [place], Li J, Salman Z, Graef T, Buske C; iNNOVATE Study Group and the [employer]. Phase 3 Trial of Ibrutinib plus Rituximab in Waldenstrm's Macroglobulinemia. [place]acy Mis J Med. 2018 Jun 21;378(25):2399-2410.   --At 30 months, the progression-free survival rate was 82% with ibrutinib-rituximab versus 28% with placebo-rituximab (hazard ratio for progression or death, 0.20; P<0.001).

## 2023-04-20 NOTE — Addendum Note (Signed)
Addended by: Glee Arvin on: 04/20/2023 03:23 PM   Modules accepted: Orders

## 2023-04-21 LAB — KAPPA/LAMBDA LIGHT CHAINS
Kappa free light chain: 28 mg/L — ABNORMAL HIGH (ref 3.3–19.4)
Kappa, lambda light chain ratio: 5.38 — ABNORMAL HIGH (ref 0.26–1.65)
Lambda free light chains: 5.2 mg/L — ABNORMAL LOW (ref 5.7–26.3)

## 2023-04-25 ENCOUNTER — Other Ambulatory Visit (HOSPITAL_COMMUNITY): Payer: Self-pay

## 2023-04-25 LAB — MULTIPLE MYELOMA PANEL, SERUM
Albumin SerPl Elph-Mcnc: 3.6 g/dL (ref 2.9–4.4)
Albumin/Glob SerPl: 1.8 — ABNORMAL HIGH (ref 0.7–1.7)
Alpha 1: 0.2 g/dL (ref 0.0–0.4)
Alpha2 Glob SerPl Elph-Mcnc: 0.6 g/dL (ref 0.4–1.0)
B-Globulin SerPl Elph-Mcnc: 0.8 g/dL (ref 0.7–1.3)
Gamma Glob SerPl Elph-Mcnc: 0.5 g/dL (ref 0.4–1.8)
Globulin, Total: 2.1 g/dL — ABNORMAL LOW (ref 2.2–3.9)
IgA: 32 mg/dL — ABNORMAL LOW (ref 61–437)
IgG (Immunoglobin G), Serum: 537 mg/dL — ABNORMAL LOW (ref 603–1613)
IgM (Immunoglobulin M), Srm: 118 mg/dL (ref 20–172)
M Protein SerPl Elph-Mcnc: 0.1 g/dL — ABNORMAL HIGH
Total Protein ELP: 5.7 g/dL — ABNORMAL LOW (ref 6.0–8.5)

## 2023-04-27 ENCOUNTER — Other Ambulatory Visit: Payer: Self-pay

## 2023-04-27 ENCOUNTER — Other Ambulatory Visit (HOSPITAL_COMMUNITY): Payer: Self-pay

## 2023-04-27 DIAGNOSIS — Z515 Encounter for palliative care: Secondary | ICD-10-CM

## 2023-04-27 DIAGNOSIS — G893 Neoplasm related pain (acute) (chronic): Secondary | ICD-10-CM

## 2023-04-27 DIAGNOSIS — C880 Waldenstrom macroglobulinemia: Secondary | ICD-10-CM

## 2023-04-27 MED ORDER — METHADONE HCL 10 MG PO TABS
10.0000 mg | ORAL_TABLET | Freq: Three times a day (TID) | ORAL | 0 refills | Status: DC
Start: 2023-04-27 — End: 2023-05-28
  Filled 2023-04-27: qty 90, 30d supply, fill #0

## 2023-05-03 ENCOUNTER — Other Ambulatory Visit (HOSPITAL_COMMUNITY): Payer: Self-pay

## 2023-05-04 ENCOUNTER — Encounter: Payer: Self-pay | Admitting: Student

## 2023-05-09 ENCOUNTER — Other Ambulatory Visit: Payer: Self-pay | Admitting: Nurse Practitioner

## 2023-05-09 ENCOUNTER — Other Ambulatory Visit (HOSPITAL_COMMUNITY): Payer: Self-pay

## 2023-05-09 ENCOUNTER — Telehealth: Payer: Self-pay

## 2023-05-09 DIAGNOSIS — C880 Waldenstrom macroglobulinemia: Secondary | ICD-10-CM

## 2023-05-09 DIAGNOSIS — G893 Neoplasm related pain (acute) (chronic): Secondary | ICD-10-CM

## 2023-05-09 DIAGNOSIS — Z515 Encounter for palliative care: Secondary | ICD-10-CM

## 2023-05-09 MED ORDER — OXYCODONE HCL 10 MG PO TABS
10.0000 mg | ORAL_TABLET | ORAL | 0 refills | Status: DC | PRN
Start: 2023-05-09 — End: 2023-05-22
  Filled 2023-05-09: qty 90, 15d supply, fill #0

## 2023-05-09 NOTE — Telephone Encounter (Signed)
Pt called for refill, refill sent in. Pt also educated on the need to call 2-3 days before he needs a refill and pt verbalized understanding.

## 2023-05-11 ENCOUNTER — Other Ambulatory Visit (HOSPITAL_COMMUNITY): Payer: Self-pay

## 2023-05-12 ENCOUNTER — Other Ambulatory Visit (HOSPITAL_COMMUNITY): Payer: Self-pay

## 2023-05-12 DIAGNOSIS — Z419 Encounter for procedure for purposes other than remedying health state, unspecified: Secondary | ICD-10-CM | POA: Diagnosis not present

## 2023-05-17 ENCOUNTER — Other Ambulatory Visit (HOSPITAL_COMMUNITY): Payer: Self-pay

## 2023-05-17 NOTE — Progress Notes (Unsigned)
[facility] [phone]   [phone]  PROGRESS NOTE  Patient Care Team: [name], DO as PCP - General Pickenpack-Cousar, [name], NP as Nurse Practitioner (Nurse Practitioner)  Hematological/Oncological History # Waldenstrom's Macroglobulinemia # IgM Monoclonal Gammopathy 04/07/2021: CT A/p showed mildly enlarged retroperitoneal lymph nodes and bilateral inguinal lymph nodes.  04/08/2021: IgM Kappa M protein 1.2 04/09/2021: WBC 11.1, Hgb 9.5, MCV 75.4, Plt 525 04/20/2021: Kappa 977, Lambda 7.8, K/L ratio 125.28. Serum viscosity 1.8 05/06/2021: Establish care with Dr. 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 Infusion reaction with ritux, held halfway through.  06/12/2021: Cycle 2 Day 1 of Ibrutinib + Rituximab 06/19/2021: Cycle 3 Day 1 of Ibrutinib + Rituximab 06/26/2021: Cycle 4 Day 1 of rituximab. Continued on daily ibrutinib 12/02/2021: Cycle 5 Day 1 of Ibrutinib + Rituximab 12/09/2021: Cycle 6 Day 1 of Ibrutinib + Rituximab 12/16/2021: Cycle 7 Day 1 of Ibrutinib + Rituximab 12/23/2021: Held Cycle 8 of Ritxumab due to nausea/vomiting, elevated creatinine, finger infection  Interval History:  Connor Solomon 62 y.o. male with medical history significant for Waldenstrom's macroglobulinemia who presents for a follow up visit. The patient's last visit was on 04/20/23. In the interim since the last visit he has continued on Ibrutinib therapy.   On exam today [name] reports about 10 days ago he developed a painful lump on the back of his head.  He reports is quite tender and itchy.  He notes it has drained some and has caused some crusting in the area.  He reports he is not having any systemic symptoms such as fevers, chills, sweats.  He is not having any other lesions like  this elsewhere on his body.  He reports he can be so painful sometimes he has difficulty sleeping.  Otherwise his pain elsewhere in his body is under good control.  He is faithfully taking his ibrutinib therapy without any major side effects.  His nausea and vomiting is under good control at this time overall he is willing and able to proceed with ibrutinib therapy. He is tolerating ibrutinib therapy at this time.  He denies fevers, chills, night sweats, shortness of breath, chest pain or cough. He has no other complaints. Full 10 point ROS is listed below.   MEDICAL HISTORY:  Past Medical History:  Diagnosis Date   Acute heart failure (HCC) 04/08/2021   Acute HFrEF (heart failure with reduced ejection fraction) (HCC) 04/07/2021   Arthritis    hands, elbows bilaterally   Cancer (HCC)    Full dentures    Hypertension    Pt states he doen not have HTN and has never been treated for HTN   Kidney stone on left side last stone 4-5 yrs ago   recurrent (3 episodes)   Panic disorder    pt states has resolved   Syncope    resolved- was related to panic attacks    SURGICAL HISTORY: Past Surgical History:  Procedure Laterality Date   CARPAL METACARPAL FUSION WITH DISTAL RADIAL BONE GRAFT Left 05/30/2013   Procedure: LEFT THUMB METACARPAL JOINT FUSION;  Surgeon: [name], MD;  Location: [facility];  Service: Orthopedics;  Laterality: Left;   ESOPHAGOGASTRODUODENOSCOPY N/A 02/12/2014   Procedure: ESOPHAGOGASTRODUODENOSCOPY (EGD);  Surgeon: [name], MD;  Location: [facility] ENDOSCOPY;  Service: Endoscopy;  Laterality: N/A;   FOREIGN BODY  REMOVAL N/A 02/12/2014   Procedure: FOREIGN BODY REMOVAL;  Surgeon: [name], MD;  Location: [facility] ENDOSCOPY;  Service: Endoscopy;  Laterality: N/A;   OPEN REDUCTION INTERNAL FIXATION (ORIF) DISTAL RADIAL FRACTURE Left 11/29/2012   Procedure: OPEN REDUCTION INTERNAL FIXATION (ORIF) DISTAL RADIAL FRACTURE;  Surgeon: [name], MD;   Location: [facility];  Service: Orthopedics;  Laterality: Left;  LEFT DISTAL RADIUS OSTEOTOMY WITH BONE GRAFT   RIGHT/LEFT HEART CATH AND CORONARY ANGIOGRAPHY N/A 04/09/2021   Procedure: RIGHT/LEFT HEART CATH AND CORONARY ANGIOGRAPHY;  Surgeon: [name], MD;  Location: [facility];  Service: Cardiovascular;  Laterality: N/A;   TENDON REPAIR Left 02/07/2013   Procedure: LEFT EXTENSOR INDICIS PROPRIUS  TO EXTENSOR POLLICIS LONGUS TENDON TRANSFER;  Surgeon: [name], MD;  Location: [facility];  Service: Orthopedics;  Laterality: Left;   WISDOM TOOTH EXTRACTION     WRIST SURGERY     fx only    SOCIAL HISTORY: Social History   Socioeconomic History   Marital status: Single    Spouse name: Not on file   Number of children: Not on file   Years of education: Not on file   Highest education level: Not on file  Occupational History   Not on file  Tobacco Use   Smoking status: Former    Types: Cigars    Quit date: 10/11/2020    Years since quitting: 2.6   Smokeless tobacco: Never   Tobacco comments:    smokes 1 cigar a week  Vaping Use   Vaping status: Never Used  Substance and Sexual Activity   Alcohol use: No   Drug use: No   Sexual activity: Not on file  Other Topics Concern   Not on file  Social History Narrative   Not on file   Social Determinants of Health   Financial Resource Strain: High Risk (07/17/2021)   Overall Financial Resource Strain (CARDIA)    Difficulty of Paying Living Expenses: Very hard  Food Insecurity: Food Insecurity Present (04/10/2021)   Hunger Vital Sign    Worried About Running Out of Food in the Last Year: Sometimes true    Ran Out of Food in the Last Year: Sometimes true  Transportation Needs: No Transportation Needs (04/10/2021)   PRAPARE - Administrator, Civil Service (Medical): No    Lack of Transportation (Non-Medical): No  Physical Activity: Not on file  Stress: Stress Concern  Present (09/01/2021)   [employer] of Occupational Health - Occupational Stress Questionnaire    Feeling of Stress : Very much  Social Connections: Not on file  Intimate Partner Violence: Not on file    FAMILY HISTORY: Family History  Problem Relation Age of Onset   Heart attack Father    Sudden Cardiac Death Father 35   Diabetes Neg Hx    Hyperlipidemia Neg Hx    Hypertension Neg Hx     ALLERGIES:  is allergic to rituxan [rituximab] and hydrocodone.  MEDICATIONS:  Current Outpatient Medications  Medication Sig Dispense Refill   acetaminophen (TYLENOL) 500 MG tablet Take 500 mg by mouth every 6 (six) hours as needed. (Patient not taking: Reported on 01/10/2023)     albuterol (PROVENTIL HFA) 108 (90 Base) MCG/ACT inhaler Inhale 2 puffs into the lungs every 6 (six) hours as needed for wheezing or shortness of breath 6.7 g 2   carvedilol (COREG) 3.125 MG tablet Take 1 tablet by mouth 2 times daily. 60  tablet 6   clobetasol cream (TEMOVATE) 0.05 % Apply 1 Application topically 2 (two) times daily. Apply topically to the arms and hands twice daily for up to two weeks 30 g 2   dapagliflozin propanediol (FARXIGA) 10 MG TABS tablet Take 1 tablet (10 mg total) by mouth daily before breakfast. 30 tablet 11   diazepam (VALIUM) 5 MG tablet Take 1 tablet (5 mg) by mouth every 12 hours as needed for anxiety. 60 tablet 0   dronabinol (MARINOL) 5 MG capsule Take 1 capsule (5 mg) by mouth 2 times daily before a meal. 60 capsule 0   eplerenone (INSPRA) 25 MG tablet Take 0.5 tablets (12.5 mg total) by mouth daily. 15 tablet 3   ferrous sulfate (FEROSUL) 325 (65 FE) MG tablet Take 1 tablet by mouth daily with breakfast. Please take with a source of Vitamin C 90 tablet 3   ibrutinib (IMBRUVICA) 420 MG tablet Take 1 tablet (420 mg total) by mouth daily. 28 tablet 2   methadone (DOLOPHINE) 10 MG tablet Take 1 tablet (10 mg) by mouth every 8 hours. 90 tablet 0   naloxone (NARCAN) nasal spray 4 mg/0.1 mL  Take for overdose 1 each 0   naloxone (NARCAN) nasal spray 4 mg/0.1 mL Take for overdose as directed (Patient not taking: Reported on 01/10/2023) 2 each 0   ondansetron (ZOFRAN) 8 MG tablet Take 1 tablet (8 mg) by mouth every 8 hours as needed. 30 tablet 0   Oxycodone HCl 10 MG TABS Take 1 tablet (10 mg) by mouth every 4 hours as needed. 90 tablet 0   potassium chloride SA (KLOR-CON M) 20 MEQ tablet Take 1 tablet (20 mEq total) by mouth daily. 30 tablet 1   sacubitril-valsartan (ENTRESTO) 24-26 MG Take 1 tablet by mouth 2 (two) times daily. 180 tablet 3   No current facility-administered medications for this visit.    REVIEW OF SYSTEMS:   Constitutional: ( - ) fevers, ( - )  chills , ( - ) night sweats Eyes: ( - ) blurriness of vision, ( - ) double vision, ( - ) watery eyes Ears, nose, mouth, throat, and face: ( - ) mucositis, ( - ) sore throat Respiratory: ( - ) cough, ( - ) dyspnea, ( - ) wheezes Cardiovascular: ( - ) palpitation, ( - ) chest discomfort, ( - ) lower extremity swelling Gastrointestinal:  (- ) nausea, ( - ) heartburn, ( - ) change in bowel habits Skin: ( - ) abnormal skin rashes Lymphatics: ( - ) new lymphadenopathy, ( - ) easy bruising Neurological: ( - ) numbness, ( - ) tingling, ( - ) new weaknesses Behavioral/Psych: ( - ) mood change, ( - ) new changes  All other systems were reviewed with the patient and are negative.  PHYSICAL EXAMINATION: ECOG PERFORMANCE STATUS: 2 - Symptomatic, <50% confined to bed  Vitals:   05/18/23 1117  BP: (!) 135/91  Pulse: 61  Resp: 14  Temp: (!) 97.5 F (36.4 C)  SpO2: 99%   Filed Weights   05/18/23 1117  Weight: 137 lb 3.2 oz (62.2 kg)    GENERAL: Chronically ill appearing middle-aged Caucasian male, alert, no distress and comfortable SKIN: Dry cracking rash of hands bilaterally.  No issues on the chest, face, or lower extremities.  Skin color, texture, turgor are normal, no rashes or significant lesions.  EYES: conjunctiva  are pink and non-injected, sclera clear LUNGS: clear to auscultation and percussion with normal breathing effort HEART: regular  rate & rhythm and no murmurs and no lower extremity edema  MSK: Bilateral edema of hands.  PSYCH: alert & oriented x 3, fluent speech NEURO: no focal motor/sensory deficits   LABORATORY DATA:  I have reviewed the data as listed    Latest Ref Rng & Units 05/18/2023   10:59 AM 04/20/2023   10:26 AM 03/23/2023    8:35 AM  CBC  WBC 4.0 - 10.5 K/uL 7.3  5.5  5.4   Hemoglobin 13.0 - 17.0 g/dL 16.1  09.6  04.5   Hematocrit 39.0 - 52.0 % 33.5  38.5  35.4   Platelets 150 - 400 K/uL 157  233  197        Latest Ref Rng & Units 05/18/2023   10:59 AM 04/20/2023   10:26 AM 03/23/2023    8:35 AM  CMP  Glucose 70 - 99 mg/dL 84  409  77   BUN 8 - 23 mg/dL 12  15  12    Creatinine 0.61 - 1.24 mg/dL 8.11  9.14  7.82   Sodium 135 - 145 mmol/L 137  138  139   Potassium 3.5 - 5.1 mmol/L 3.9  3.8  3.8   Chloride 98 - 111 mmol/L 103  103  105   CO2 22 - 32 mmol/L 30  29  28    Calcium 8.9 - 10.3 mg/dL 8.5  9.1  9.0   Total Protein 6.5 - 8.1 g/dL 6.0  6.4  6.2   Total Bilirubin 0.3 - 1.2 mg/dL 0.5  0.6  0.5   Alkaline Phos 38 - 126 U/L 63  68  55   AST 15 - 41 U/L 12  16  16    ALT 0 - 44 U/L 6  7  9      Lab Results  Component Value Date   MPROTEIN 0.1 (H) 04/20/2023   MPROTEIN 0.1 (H) 03/23/2023   MPROTEIN 0.1 (H) 02/25/2023   Lab Results  Component Value Date   KPAFRELGTCHN 28.0 (H) 04/20/2023   KPAFRELGTCHN 27.0 (H) 03/23/2023   KPAFRELGTCHN 32.0 (H) 02/25/2023   LAMBDASER 5.2 (L) 04/20/2023   LAMBDASER 6.7 03/23/2023   LAMBDASER 7.8 02/25/2023   KAPLAMBRATIO 5.38 (H) 04/20/2023   KAPLAMBRATIO 4.03 (H) 03/23/2023   KAPLAMBRATIO 4.10 (H) 02/25/2023    RADIOGRAPHIC STUDIES: No results found.  ASSESSMENT & PLAN RAM Connor Solomon 62 y.o. male with medical history significant for Waldenstrom's macroglobulinemia who presents for a follow up visit.   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  There is no clear evidence of hyperviscosity syndrome at this time.  At this time we will plan to proceed with ibrutinib and rituximab therapy.  One could also consider Bendamustine and rituximab therapy but given the patient's degree of anemia I would prefer pursuing ibrutinib and rituximab at this time.  Additionally ibrutinib/rituximab are category 1 recommendations per the NCCN guidelines.  The treatment will consist of ibrutinib 420 mg p.o. daily with rituximab on weeks 1 through 4 as well as weeks 27 through 30.  Previously we discussed the risks and benefits of therapy including (but not limited to) risk of bleeding, fatigue, atypical infections, and cardiac arrhythmias.  The patient voices understanding of this plan moving forward.   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  This is confirmed with  an IgM monoclonal gammopathy and bone marrow biopsy results showing a lymphoplasmacytic lymphoma --No clear signs of hyperviscosity syndrome at this time. --Started second round of weekly rituximab on 12/02/2021. Continued weekly x 4 weeks.   --last rituximab dose held due to missed visits/illness.  Plan: --Labs today were reviewed without any intervention needed.  Labs show white cell count 7.3, Hgb 11.3, MCV 83.8, Plt 157.  Pending SPEP and sFLC levels but prior levels appear stable. --Currently on ibrutinib 420 mg PO daily.  --RTC in 4 weeks with labs   # Lesion on Back Of Head -- Patient has a ping-pong ball sized lesion on the back of his head.  It appears to be draining and has some dried blood and crusting. -- My concern is for an infectious process.  Reach out to patient's dermatologist Dr. Onalee Hua  but she was not able to see him today.  Encourage patient to go to [facility] for evaluation there. -- Patient is not having any systemic symptoms concerning for sepsis or widespread infection -- Lesion will likely require lancing and antibiotic therapy.  Strongly encourage patient to go to the emergency department to have this evaluated further.  # Rash-improved markedly -- Affecting hands bilaterally, upper extremities bilaterally, and back.  Not affecting face, chest, or lower extremities. -- Patient has dryness and cracking as well as erythema on the upper extremities. -- Etiology is unclear.  Patient is using hydrating creams but is not effective.  Made referral to dermatology for evaluation, he is established with Dr. Langston Reusing.  # Hypokalemia -- Potassium 3.9 continue to monitor   #Pain Management --Pain was poorly controlled on hydrocodone therapy previously.  --Patient was previously admitted for for opioid overdose with presumed fentanyl --Currently pain regimen includes methadone 10 mg TID and oxycodone 10 mg PRN for breakthrough pain per palliative care.  -- Appreciate recommendations of palliative care.  #Normocytic Anemia  --Hgb 11.3 --previously there was a component of iron deficiency anemia as well.  Currently on PO ferrous sulfate. --Continue to monitor.  #Nausea and vomiting--improved -- Concern that the nausea and vomiting may not be related to the ibrutinib therapy and potentially tied to poor gastric motility due to opioid prescription.  --Unable to tolerate olanzopine so discontinued --Not taking metoclopramide 10 mg every 8 hours  --continue zofran PRN.   #Fatigue #Anxiety/Depression: --Fatigue and depression have worsened recently --Anxiety has improved with Valium  --Will request further evaluation by palliative care team, scheduled for next visit today  #Supportive Care -- chemotherapy education complete -- port placement not  required   No orders of the defined types were placed in this encounter.  All questions were answered. The patient knows to call the clinic with any problems, questions or concerns.  I have spent a total of 30 minutes minutes of face-to-face and non-face-to-face time, preparing to see the patient,  performing a medically appropriate examination, counseling and educating the patient, ordering medications/tests, referring and communicating with other health care professionals, documenting clinical information in the electronic health record, and care coordination.   Ulysees Barns, MD Department of Hematology/Oncology [facility] Phone: [phone] Pager: [phone] Email: [email or web address]  05/18/2023 5:42 PM  Dimopoulos MA, Lennette Bihari, Trotman Joycelyn Rua, Mahe B, Herbaux C, Tam C, Orsucci L, Palomba ML, Matous JV, Ludell C, Kastritis E, Bethel, Li J, Salman Z, Graef T, Buske C; iNNOVATE Study Group and the [employer] for  Waldenstrm's Macroglobulinemia. Phase 3 Trial of Ibrutinib plus Rituximab in Waldenstrm's Macroglobulinemia. [facility] Mis J Med. 2018 Jun 21;378(25):2399-2410.   --At 30 months, the progression-free survival rate was 82% with ibrutinib-rituximab versus 28% with placebo-rituximab (hazard ratio for progression or death, 0.20; P<0.001).

## 2023-05-18 ENCOUNTER — Inpatient Hospital Stay (HOSPITAL_BASED_OUTPATIENT_CLINIC_OR_DEPARTMENT_OTHER): Payer: Medicaid Other | Admitting: Hematology and Oncology

## 2023-05-18 ENCOUNTER — Other Ambulatory Visit: Payer: Self-pay

## 2023-05-18 ENCOUNTER — Inpatient Hospital Stay: Payer: Medicaid Other | Attending: Hematology and Oncology

## 2023-05-18 VITALS — BP 135/91 | HR 61 | Temp 97.5°F | Resp 14 | Wt 137.2 lb

## 2023-05-18 DIAGNOSIS — C880 Waldenstrom macroglobulinemia: Secondary | ICD-10-CM | POA: Diagnosis not present

## 2023-05-18 DIAGNOSIS — Z515 Encounter for palliative care: Secondary | ICD-10-CM

## 2023-05-18 DIAGNOSIS — Z79899 Other long term (current) drug therapy: Secondary | ICD-10-CM | POA: Insufficient documentation

## 2023-05-18 DIAGNOSIS — G893 Neoplasm related pain (acute) (chronic): Secondary | ICD-10-CM

## 2023-05-18 DIAGNOSIS — I110 Hypertensive heart disease with heart failure: Secondary | ICD-10-CM | POA: Insufficient documentation

## 2023-05-18 DIAGNOSIS — G479 Sleep disorder, unspecified: Secondary | ICD-10-CM | POA: Insufficient documentation

## 2023-05-18 DIAGNOSIS — R590 Localized enlarged lymph nodes: Secondary | ICD-10-CM | POA: Insufficient documentation

## 2023-05-18 DIAGNOSIS — E876 Hypokalemia: Secondary | ICD-10-CM | POA: Diagnosis not present

## 2023-05-18 DIAGNOSIS — D509 Iron deficiency anemia, unspecified: Secondary | ICD-10-CM | POA: Diagnosis not present

## 2023-05-18 DIAGNOSIS — R220 Localized swelling, mass and lump, head: Secondary | ICD-10-CM | POA: Diagnosis not present

## 2023-05-18 DIAGNOSIS — Z5986 Financial insecurity: Secondary | ICD-10-CM | POA: Insufficient documentation

## 2023-05-18 DIAGNOSIS — R222 Localized swelling, mass and lump, trunk: Secondary | ICD-10-CM | POA: Insufficient documentation

## 2023-05-18 DIAGNOSIS — D472 Monoclonal gammopathy: Secondary | ICD-10-CM | POA: Insufficient documentation

## 2023-05-18 DIAGNOSIS — Z87891 Personal history of nicotine dependence: Secondary | ICD-10-CM | POA: Diagnosis not present

## 2023-05-18 DIAGNOSIS — Z79620 Long term (current) use of immunosuppressive biologic: Secondary | ICD-10-CM | POA: Insufficient documentation

## 2023-05-18 DIAGNOSIS — Z8249 Family history of ischemic heart disease and other diseases of the circulatory system: Secondary | ICD-10-CM | POA: Diagnosis not present

## 2023-05-18 DIAGNOSIS — Z885 Allergy status to narcotic agent status: Secondary | ICD-10-CM | POA: Insufficient documentation

## 2023-05-18 LAB — CMP (CANCER CENTER ONLY)
ALT: 6 U/L (ref 0–44)
AST: 12 U/L — ABNORMAL LOW (ref 15–41)
Albumin: 3.9 g/dL (ref 3.5–5.0)
Alkaline Phosphatase: 63 U/L (ref 38–126)
Anion gap: 4 — ABNORMAL LOW (ref 5–15)
BUN: 12 mg/dL (ref 8–23)
CO2: 30 mmol/L (ref 22–32)
Calcium: 8.5 mg/dL — ABNORMAL LOW (ref 8.9–10.3)
Chloride: 103 mmol/L (ref 98–111)
Creatinine: 0.74 mg/dL (ref 0.61–1.24)
GFR, Estimated: >60 mL/min (ref >60)
Glucose, Bld: 84 mg/dL (ref 70–99)
Potassium: 3.9 mmol/L (ref 3.5–5.1)
Sodium: 137 mmol/L (ref 135–145)
Total Bilirubin: 0.5 mg/dL (ref 0.3–1.2)
Total Protein: 6 g/dL — ABNORMAL LOW (ref 6.5–8.1)

## 2023-05-18 LAB — CBC WITH DIFFERENTIAL (CANCER CENTER ONLY)
Abs Immature Granulocytes: 0.02 10*3/uL (ref 0.00–0.07)
Basophils Absolute: 0.1 10*3/uL (ref 0.0–0.1)
Basophils Relative: 1 %
Eosinophils Absolute: 0.5 10*3/uL (ref 0.0–0.5)
Eosinophils Relative: 7 %
HCT: 33.5 % — ABNORMAL LOW (ref 39.0–52.0)
Hemoglobin: 11.3 g/dL — ABNORMAL LOW (ref 13.0–17.0)
Immature Granulocytes: 0 %
Lymphocytes Relative: 21 %
Lymphs Abs: 1.5 10*3/uL (ref 0.7–4.0)
MCH: 28.3 pg (ref 26.0–34.0)
MCHC: 33.7 g/dL (ref 30.0–36.0)
MCV: 83.8 fL (ref 80.0–100.0)
Monocytes Absolute: 0.6 10*3/uL (ref 0.1–1.0)
Monocytes Relative: 8 %
Neutro Abs: 4.7 10*3/uL (ref 1.7–7.7)
Neutrophils Relative %: 63 %
Platelet Count: 157 10*3/uL (ref 150–400)
RBC: 4 MIL/uL — ABNORMAL LOW (ref 4.22–5.81)
RDW: 13.6 % (ref 11.5–15.5)
WBC Count: 7.3 10*3/uL (ref 4.0–10.5)
nRBC: 0 % (ref 0.0–0.2)

## 2023-05-18 LAB — LACTATE DEHYDROGENASE: LDH: 163 U/L (ref 98–192)

## 2023-05-19 ENCOUNTER — Ambulatory Visit (HOSPITAL_COMMUNITY)
Admission: RE | Admit: 2023-05-19 | Discharge: 2023-05-19 | Disposition: A | Discharge status: Home / Self Care | Payer: Medicaid Other | Source: Ambulatory Visit | Attending: Family Medicine | Admitting: Family Medicine

## 2023-05-19 ENCOUNTER — Encounter (HOSPITAL_COMMUNITY): Payer: Self-pay

## 2023-05-19 ENCOUNTER — Other Ambulatory Visit (HOSPITAL_COMMUNITY): Payer: Self-pay

## 2023-05-19 ENCOUNTER — Other Ambulatory Visit: Payer: Self-pay | Admitting: Internal Medicine

## 2023-05-19 ENCOUNTER — Other Ambulatory Visit: Payer: Self-pay | Admitting: Nurse Practitioner

## 2023-05-19 VITALS — BP 156/72 | HR 59 | Temp 97.6°F | Resp 18 | Ht 68.0 in | Wt 137.0 lb

## 2023-05-19 DIAGNOSIS — G4700 Insomnia, unspecified: Secondary | ICD-10-CM

## 2023-05-19 DIAGNOSIS — I10 Essential (primary) hypertension: Secondary | ICD-10-CM | POA: Diagnosis not present

## 2023-05-19 DIAGNOSIS — G893 Neoplasm related pain (acute) (chronic): Secondary | ICD-10-CM

## 2023-05-19 DIAGNOSIS — L03811 Cellulitis of head [any part, except face]: Secondary | ICD-10-CM | POA: Diagnosis not present

## 2023-05-19 DIAGNOSIS — Z515 Encounter for palliative care: Secondary | ICD-10-CM

## 2023-05-19 DIAGNOSIS — C880 Waldenstrom macroglobulinemia: Secondary | ICD-10-CM

## 2023-05-19 MED ORDER — ALBUTEROL SULFATE HFA 108 (90 BASE) MCG/ACT IN AERS
2.0000 | INHALATION_SPRAY | Freq: Four times a day (QID) | RESPIRATORY_TRACT | 2 refills | Status: DC | PRN
  Filled 2023-05-19: qty 18, 25d supply, fill #0

## 2023-05-19 MED ORDER — LIDOCAINE-EPINEPHRINE 1 %-1:100000 IJ SOLN
INTRAMUSCULAR | Status: AC
  Filled 2023-05-19: qty 1

## 2023-05-19 MED ORDER — SULFAMETHOXAZOLE-TRIMETHOPRIM 800-160 MG PO TABS
1.0000 | ORAL_TABLET | Freq: Two times a day (BID) | ORAL | 0 refills | Status: DC
  Filled 2023-05-19: qty 20, 10d supply, fill #0

## 2023-05-19 MED ORDER — LIDOCAINE-EPINEPHRINE-TETRACAINE (LET) TOPICAL GEL
3.0000 mL | Freq: Once | TOPICAL | Status: AC
  Administered 2023-05-19: 3 mL via TOPICAL

## 2023-05-19 MED ORDER — DIAZEPAM 5 MG PO TABS
5.0000 mg | ORAL_TABLET | Freq: Two times a day (BID) | ORAL | 0 refills | Status: DC | PRN
  Filled 2023-05-19: qty 60, 30d supply, fill #0

## 2023-05-19 MED ORDER — LIDOCAINE-EPINEPHRINE-TETRACAINE (LET) TOPICAL GEL
TOPICAL | Status: AC
  Filled 2023-05-19: qty 3

## 2023-05-19 NOTE — ED Triage Notes (Signed)
"  In grow hair - Entered by patient"  Patient has a mass on the back of the head onset  2 weeks ago. States the area has grown in the last 4 days and thinks it is an ingrown hair. Started to drain yesterday night.   Patient has history of cancer.

## 2023-05-19 NOTE — Discharge Instructions (Addendum)
Start antibiotics today, Bactrim twice daily for 10 days. Try and keep abscess covered and do not remove wound packing. Return here in 2 days for wound recheck

## 2023-05-19 NOTE — Telephone Encounter (Signed)
LOV 07/2021. Pt was called to schedule an appt ; Junious Dresser, his girlfriend, answered the telephone. Stated she will make an appt for him; call transferred to the front office. Appt schedule with Dr August Saucer on 8/21.

## 2023-05-19 NOTE — ED Provider Notes (Signed)
[facility]    CSN: [id number] Arrival date & time: 05/19/23  1043      History   Chief Complaint Chief Complaint  Patient presents with   Abscess   Appointment    HPI Connor Solomon is a 62 y.o. male.   HPI Patient presents today for evaluation of a large abscess is draining mid lower scalp.  Patient reports the abscess has been there about 10 days.  He report is currently painful and draining a puslike substance.  Patient is currently under the care of of oncology who had advised him to come in and get the abscess evaluated. Past Medical History:  Diagnosis Date   Acute heart failure (HCC) 04/08/2021   Acute HFrEF (heart failure with reduced ejection fraction) (HCC) 04/07/2021   Arthritis    hands, elbows bilaterally   Cancer (HCC)    Full dentures    Hypertension    Pt states he doen not have HTN and has never been treated for HTN   Kidney stone on left side last stone 4-5 yrs ago   recurrent (3 episodes)   Panic disorder    pt states has resolved   Syncope    resolved- was related to panic attacks    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 06/29/2021   Dyspnea 06/11/2021   Gynecomastia 06/04/2021   Osteoarthritis (arthritis due to wear and tear of joints) 06/04/2021   Screening for depression 06/04/2021   Waldenstrom macroglobulinemia (HCC) 05/24/2021   Rash in adult 05/18/2021   Tinnitus aurium, 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   HFrEF (heart failure with reduced ejection fraction) (HCC) 04/20/2021   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   Pleuritic pain 04/20/2021   Chronic systolic heart failure (HCC) 04/17/2021   Weight loss, unintentional 04/08/2021   Microcytic anemia 04/08/2021   Mitral regurgitation 04/08/2021   Benign essential hypertension  09/22/2009    Past Surgical History:  Procedure Laterality Date   CARPAL METACARPAL FUSION WITH DISTAL RADIAL BONE GRAFT Left 05/30/2013   Procedure: LEFT THUMB METACARPAL JOINT FUSION;  Surgeon: [name], MD;  Location: [facility];  Service: Orthopedics;  Laterality: Left;   ESOPHAGOGASTRODUODENOSCOPY N/A 02/12/2014   Procedure: ESOPHAGOGASTRODUODENOSCOPY (EGD);  Surgeon: [name], MD;  Location: [facility] END[facility]COPY;  Service: Endoscopy;  Laterality: N/A;   FOREIGN BODY REMOVAL N/A 02/12/2014   Procedure: FOREIGN BODY REMOVAL;  Surgeon: [name], MD;  Location: [facility] ENDOSCOPY;  Service: Endoscopy;  Laterality: N/A;   OPEN REDUCTION INTERNAL FIXATION (ORIF) DISTAL RADIAL FRACTURE Left 11/29/2012   Procedure: OPEN REDUCTION INTERNAL FIXATION (ORIF) DISTAL RADIAL FRACTURE;  Surgeon: [name], MD;  Location: [facility];  Service: Orthopedics;  Laterality: Left;  LEFT DISTAL RADIUS OSTEOTOMY WITH BONE GRAFT   RIGHT/LEFT HEART CATH AND CORONARY ANGIOGRAPHY N/A 04/09/2021   Procedure: RIGHT/LEFT HEART CATH AND CORONARY ANGIOGRAPHY;  Surgeon: [name], MD;  [facility];  Service: Cardiovascular;  Laterality: N/A;   TENDON REPAIR Left 02/07/2013   Procedure: LEFT EXTENSOR INDICIS PROPRIUS  TO EXTENSOR POLLICIS LONGUS TENDON TRANSFER;  Surgeon: [name], MD;  Location: [facility];  Service: Orthopedics;  Laterality: Left;   WISDOM TOOTH EXTRACTION     WRIST SURGERY     fx  only       Home Medications    Prior to Admission medications   Medication Sig Start Date End Date Taking? Authorizing Provider  albuterol (PROVENTIL HFA) 108 (90 Base) MCG/ACT inhaler Inhale 2 puffs into the lungs every 6 (six) hours as needed for wheezing or shortness of breath 11/12/22  Yes Adron Bene, MD  carvedilol (COREG) 3.125 MG tablet Take 1 tablet by mouth 2 times daily. 01/13/23  Yes Milford, Anderson Malta, FNP  clobetasol  cream (TEMOVATE) 0.05 % Apply 1 Application topically 2 (two) times daily. Apply topically to the arms and hands twice daily for up to two weeks 01/05/23  Yes Terri Piedra, DO  dapagliflozin propanediol (FARXIGA) 10 MG TABS tablet Take 1 tablet (10 mg total) by mouth daily before breakfast. 07/02/22  Yes Bensimhon, Bevelyn Buckles, MD  diazepam (VALIUM) 5 MG tablet Take 1 tablet (5 mg) by mouth every 12 hours as needed for anxiety. 05/19/23  Yes Pickenpack-Cousar, [name], NP  dronabinol (MARINOL) 5 MG capsule Take 1 capsule (5 mg) by mouth 2 times daily before a meal. 02/21/23  Yes Pickenpack-Cousar, Athena N, NP  eplerenone (INSPRA) 25 MG tablet Take 0.5 tablets (12.5 mg total) by mouth daily. 01/03/23  Yes Milford, Anderson Malta, FNP  ferrous sulfate (FEROSUL) 325 (65 FE) MG tablet Take 1 tablet by mouth daily with breakfast. Please take with a source of Vitamin C 03/14/23  Yes Jaci Standard, MD  ibrutinib (IMBRUVICA) 420 MG tablet Take 1 tablet (420 mg total) by mouth daily. 10/25/22  Yes Jaci Standard, MD  methadone (DOLOPHINE) 10 MG tablet Take 1 tablet (10 mg) by mouth every 8 hours. 04/27/23  Yes Pickenpack-Cousar, [name], NP  Oxycodone HCl 10 MG TABS Take 1 tablet (10 mg) by mouth every 4 hours as needed. 05/09/23  Yes Pickenpack-Cousar, [name], NP  potassium chloride SA (KLOR-CON M) 20 MEQ tablet Take 1 tablet (20 mEq total) by mouth daily. 10/01/22  Yes Jaci Standard, MD  sacubitril-valsartan (ENTRESTO) 24-26 MG Take 1 tablet by mouth 2 (two) times daily. 06/07/22  Yes Bensimhon, Bevelyn Buckles, MD  sulfamethoxazole-trimethoprim (BACTRIM DS) 800-160 MG tablet Take 1 tablet by mouth 2 (two) times daily. 05/19/23  Yes Bing Neighbors, NP  acetaminophen (TYLENOL) 500 MG tablet Take 500 mg by mouth every 6 (six) hours as needed. Patient not taking: Reported on 01/10/2023    [provider]  naloxone [facility]) nasal spray 4 mg/0.1 mL Take for overdose 01/23/22   Ernie Avena, MD  naloxone  [facility]) nasal spray 4 mg/0.1 mL Take for overdose as directed 01/23/22   Ernie Avena, MD  ondansetron (ZOFRAN) 8 MG tablet Take 1 tablet (8 mg) by mouth every 8 hours as needed. 03/23/23   Pickenpack-Cousar, [name], NP    Family History Family History  Problem Relation Age of Onset   Heart attack Father    Sudden Cardiac Death Father 59   Diabetes Neg Hx    Hyperlipidemia Neg Hx    Hypertension Neg Hx     Social History Social History   Tobacco Use   Smoking status: Former    Types: Cigars    Quit date: 10/11/2020    Years since quitting: 2.6   Smokeless tobacco: Never   Tobacco comments:    smokes 1 cigar a week  Vaping Use   Vaping status: Never Used  Substance Use Topics   Alcohol use: No   Drug use:  No     Allergies   Rituxan [rituximab] and Hydrocodone   Review of Systems Review of Systems Pertinent negatives listed in HPI  Physical Exam Triage Vital Signs ED Triage Vitals  Encounter Vitals Group     BP 05/19/23 1053 (!) 156/72     Systolic BP Percentile --      Diastolic BP Percentile --      Pulse Rate 05/19/23 1053 (!) 59     Resp 05/19/23 1053 18     Temp 05/19/23 1053 97.6 F (36.4 C)     Temp src --      SpO2 05/19/23 1053 96 %     Weight 05/19/23 1053 137 lb (62.1 kg)     Height 05/19/23 1053 5\' 8"  (1.727 m)     Head Circumference --      Peak Flow --      Pain Score 05/19/23 1051 7     Pain Loc --      Pain Education --      Exclude from Growth Chart --    No data found.  Updated Vital Signs BP (!) 156/72 (BP Location: Left Arm)   Pulse (!) 59   Temp 97.6 F (36.4 C)   Resp 18   Ht 5\' 8"  (1.727 m)   Wt 137 lb (62.1 kg)   SpO2 96%   BMI 20.83 kg/m   Visual Acuity Right Eye Distance:   Left Eye Distance:   Bilateral Distance:    Right Eye Near:   Left Eye Near:    Bilateral Near:     Physical Exam Constitutional:      Appearance: He is underweight.    HENT:     Head: Normocephalic and atraumatic.  Eyes:      Extraocular Movements: Extraocular movements intact.     Pupils: Pupils are equal, round, and reactive to light.  Cardiovascular:     Rate and Rhythm: Regular rhythm. Tachycardia present.  Pulmonary:     Breath sounds: Normal breath sounds.  Neurological:     Mental Status: He is alert.  Psychiatric:        Behavior: Behavior is cooperative.      UC Treatments / Results  Labs (all labs ordered are listed, but only abnormal results are displayed) Labs Reviewed - No data to display  EKG   Radiology No results found.  Procedures Incision and Drainage  Date/Time: 05/19/2023 12:30 PM  Performed by: Bing Neighbors, NP Authorized by: Bing Neighbors, NP   Consent:    Consent obtained:  Verbal   Consent given by:  Patient   Risks discussed:  Bleeding, infection and incomplete drainage   Alternatives discussed:  No treatment Universal protocol:    Patient identity confirmed:  Verbally with patient Location:    Type:  Abscess   Size:  5 mm   Location:  Head   Head location:  Scalp Pre-procedure details:    Skin preparation:  Povidone-iodine Sedation:    Sedation type:  None Anesthesia:    Anesthesia method:  Topical application and local infiltration   Topical anesthetic:  LET   Local anesthetic:  Lidocaine 1% WITH epi Procedure type:    Complexity:  Complex Procedure details:    Incision types:  Elliptical and stab incision   Incision depth:  Dermal   Wound management:  Irrigated with saline   Drainage:  Purulent and bloody   Drainage amount:  Copious   Packing materials:  Wick placed  and 1/2 in iodoform gauze Post-procedure details:    Procedure completion:  Tolerated  (including critical care time)  Medications Ordered in UC Medications  lidocaine-EPINEPHrine-tetracaine (LET) topical gel (3 mLs Topical Given 05/19/23 1151)    Initial Impression / Assessment and Plan / UC Course  I have reviewed the triage vital signs and the nursing  notes.  Pertinent labs & imaging results that were available during my care of the patient were reviewed by me and considered in my medical decision making (see chart for details).    Abscess of scalp due to cellulitis Treatment with Bactrim double strength twice daily x  10 days. Return in 2 days for wound check and to have wound packing removed and possibly repacked if needed.Patient verbalized understanding and agreement with plan. Final Clinical Impressions(s) / UC Diagnoses   Final diagnoses:  Abscess or cellulitis of scalp     Discharge Instructions      Start antibiotics today, Bactrim twice daily for 10 days. Try and keep abscess covered and do not remove wound packing. Return here in 2 days for wound recheck     ED Prescriptions     Medication Sig Dispense Auth. Provider   sulfamethoxazole-trimethoprim (BACTRIM DS) 800-160 MG tablet Take 1 tablet by mouth 2 (two) times daily. 20 tablet Bing Neighbors, NP      PDMP not reviewed this encounter.   Bing Neighbors, NP 05/19/23 [phone]

## 2023-05-21 ENCOUNTER — Encounter (HOSPITAL_COMMUNITY): Payer: Self-pay

## 2023-05-21 ENCOUNTER — Ambulatory Visit (HOSPITAL_COMMUNITY)
Admission: EM | Admit: 2023-05-21 | Discharge: 2023-05-21 | Disposition: A | Discharge status: Home / Self Care | Payer: Medicaid Other | Source: Home / Self Care | Attending: Family Medicine | Admitting: Family Medicine

## 2023-05-21 VITALS — BP 112/66 | HR 78 | Temp 98.0°F | Resp 16

## 2023-05-21 DIAGNOSIS — L0211 Cutaneous abscess of neck: Secondary | ICD-10-CM

## 2023-05-21 NOTE — ED Triage Notes (Signed)
Patient is here for follow-up on abscess on the back on his head. Pt states the abscess is not healing well.

## 2023-05-21 NOTE — Discharge Instructions (Signed)
I have removed the packing from your recent incision and drainage. I am not going to repack this area.  I have placed a referral to the head/neck surgeon for further evaluation.

## 2023-05-22 ENCOUNTER — Other Ambulatory Visit: Payer: Self-pay | Admitting: Nurse Practitioner

## 2023-05-22 DIAGNOSIS — C880 Waldenstrom macroglobulinemia: Secondary | ICD-10-CM

## 2023-05-22 DIAGNOSIS — G893 Neoplasm related pain (acute) (chronic): Secondary | ICD-10-CM

## 2023-05-22 DIAGNOSIS — Z515 Encounter for palliative care: Secondary | ICD-10-CM

## 2023-05-23 ENCOUNTER — Telehealth (INDEPENDENT_AMBULATORY_CARE_PROVIDER_SITE_OTHER): Payer: Self-pay | Admitting: Otolaryngology

## 2023-05-23 ENCOUNTER — Other Ambulatory Visit (HOSPITAL_COMMUNITY): Payer: Self-pay

## 2023-05-23 MED ORDER — OXYCODONE HCL 10 MG PO TABS
10.0000 mg | ORAL_TABLET | ORAL | 0 refills | Status: DC | PRN
Start: 2023-05-23 — End: 2023-06-06
  Filled 2023-05-23: qty 90, 15d supply, fill #0

## 2023-05-23 NOTE — Telephone Encounter (Signed)
It looks like he was seen at urgent care and they recommended seeing a head and neck surgeon given its appearance. They will need to go to [facility] for that. Thanks

## 2023-05-23 NOTE — Telephone Encounter (Signed)
Ill update the referral note, Thank you

## 2023-05-23 NOTE — ED Provider Notes (Signed)
[facility]   [id number] 05/21/23 Arrival Time: 1035  ASSESSMENT & PLAN:  1. Abscess of neck    Packing removed from posterior neck/scalp wound and festering mass. Some purulent debris. Continue and finish Bactrim. Discussed that I think a head and neck surgeon needs to look at this given the size and appearance. If a cyst may need wide excision. He asks for me to explore deeper intot he wound today; declined.  Orders Placed This Encounter  Procedures   Ambulatory referral to ENT    Referral Priority:   Urgent    Referral Type:   Consultation    Referral Reason:   Specialty Services Required    Requested Specialty:   Otolaryngology    Number of Visits Requested:   1   Apply dressing    To neck/scalp abscess as best we can. Thank you.    Standing Status:   Standing    Number of Occurrences:   1   Has pain medications at home. Reviewed expectations re: course of current medical issues. Questions answered. Outlined signs and symptoms indicating need for more acute intervention. Patient verbalized understanding. After Visit Summary given.   SUBJECTIVE:  Connor Solomon is a 62 y.o. male who presents for wound check and packing removal. Last visit reviewed. Area is still painful. Denies bleeding/drainage. Denies fever. He thinks this has been present about two weeks.   OBJECTIVE:  Vitals:   05/21/23 1108  BP: 112/66  Pulse: 78  Resp: 16  Temp: 98 F (36.7 C)  TempSrc: Oral  SpO2: 97%     General appearance: alert; no distress Neck: approx 1.5 cm diameter erythematous festering induration of his post neck near/occipital scalp; tender to touch; no active drainage; mild bleeding; very TTP Psychological: alert and cooperative; normal mood and affect  Allergies  Allergen Reactions   Rituxan [Rituximab] Other (See Comments) and Cough    Mild chest discomfort & rigors   Hydrocodone Itching and Nausea And Vomiting    Past Medical History:  Diagnosis Date    Acute heart failure (HCC) 04/08/2021   Acute HFrEF (heart failure with reduced ejection fraction) (HCC) 04/07/2021   Arthritis    hands, elbows bilaterally   Cancer (HCC)    Full dentures    Hypertension    Pt states he doen not have HTN and has never been treated for HTN   Kidney stone on left side last stone 4-5 yrs ago   recurrent (3 episodes)   Panic disorder    pt states has resolved   Syncope    resolved- was related to panic attacks   Social History   Socioeconomic History   Marital status: Single    Spouse name: Not on file   Number of children: Not on file   Years of education: Not on file   Highest education level: Not on file  Occupational History   Not on file  Tobacco Use   Smoking status: Former    Types: Cigars    Quit date: 10/11/2020    Years since quitting: 2.6   Smokeless tobacco: Never   Tobacco comments:    smokes 1 cigar a week  Vaping Use   Vaping status: Never Used  Substance and Sexual Activity   Alcohol use: No   Drug use: No   Sexual activity: Not on file  Other Topics Concern   Not on file  Social History Narrative   Not on file   Social Determinants of Health  Financial Resource Strain: High Risk (07/17/2021)   Overall Financial Resource Strain (CARDIA)    Difficulty of Paying Living Expenses: Very hard  Food Insecurity: Food Insecurity Present (04/10/2021)   Hunger Vital Sign    Worried About Running Out of Food in the Last Year: Sometimes true    Ran Out of Food in the Last Year: Sometimes true  Transportation Needs: No Transportation Needs (04/10/2021)   PRAPARE - Administrator, Civil Service (Medical): No    Lack of Transportation (Non-Medical): No  Physical Activity: Not on file  Stress: Stress Concern Present (09/01/2021)   [employer] of Occupational Health - Occupational Stress Questionnaire    Feeling of Stress : Very much  Social Connections: Not on file   Family History  Problem Relation Age of  Onset   Heart attack Father    Sudden Cardiac Death Father 5   Diabetes Neg Hx    Hyperlipidemia Neg Hx    Hypertension Neg Hx    Past Surgical History:  Procedure Laterality Date   CARPAL METACARPAL FUSION WITH DISTAL RADIAL BONE GRAFT Left 05/30/2013   Procedure: LEFT THUMB METACARPAL JOINT FUSION;  Surgeon: [name], MD;  Location: [facility];  Service: Orthopedics;  Laterality: Left;   ESOPHAGOGASTRODUODENOSCOPY N/A 02/12/2014   Procedure: ESOPHAGOGASTRODUODENOSCOPY (EGD);  Surgeon: [name], MD;  Location: [facility] END[facility];  Service: Endoscopy;  Laterality: N/A;   FOREIGN BODY REMOVAL N/A 02/12/2014   Procedure: FOREIGN BODY REMOVAL;  Surgeon: [name], MD;  Location: [facility] ENDOSCOPY;  Service: Endoscopy;  Laterality: N/A;   OPEN REDUCTION INTERNAL FIXATION (ORIF) DISTAL RADIAL FRACTURE Left 11/29/2012   Procedure: OPEN REDUCTION INTERNAL FIXATION (ORIF) DISTAL RADIAL FRACTURE;  Surgeon: [name], MD;  Location: [facility];  Service: Orthopedics;  Laterality: Left;  LEFT DISTAL RADIUS OSTEOTOMY WITH BONE GRAFT   RIGHT/LEFT HEART CATH AND CORONARY ANGIOGRAPHY N/A 04/09/2021   Procedure: RIGHT/LEFT HEART CATH AND CORONARY ANGIOGRAPHY;  Surgeon: [name], MD;  [facility];  Service: Cardiovascular;  Laterality: N/A;   TENDON REPAIR Left 02/07/2013   Procedure: LEFT EXTENSOR INDICIS PROPRIUS  TO EXTENSOR POLLICIS LONGUS TENDON TRANSFER;  Surgeon: [name], MD;  Location: [facility];  Service: Orthopedics;  Laterality: Left;   WISDOM TOOTH EXTRACTION     WRIST SURGERY     fx only            Mardella Layman, MD 05/23/23 1021

## 2023-05-23 NOTE — Telephone Encounter (Signed)
Patient had ENT referral sent to [place] for patient to be seen. Per ED note: Packing removed from posterior neck/scalp wound and festering mass. Can you look at the notes and let me know if this is something yall treat.

## 2023-05-23 NOTE — Progress Notes (Deleted)
Palliative Medicine [facility]  [phone] [phone]   Name: Connor Solomon Date: 05/23/2023 MRN: [id number]  DOB: [birth year]  Patient Care Team: [name], DO as PCP - General Pickenpack-Cousar, [name], NP as Nurse Practitioner (Nurse Practitioner)   I connected with Sharol Given on 05/23/23 at  1:30 PM EDT by phone and verified that I am speaking with the correct person using two identifiers.   I discussed the limitations, risks, security and privacy concerns of performing an evaluation and management service by telemedicine and the availability of in-person appointments. I also discussed with the patient that there may be a patient responsible charge related to this service. The patient expressed understanding and agreed to proceed.   Other persons participating in the visit and their role in the encounter: n/a   Patient's location: home  Provider's location: [facility]   Chief Complaint: f/u of symptom management    REASON FOR CONSULTATION: Connor Solomon is a 62 y.o. 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 Palliative ask to see for symptom management.  SOCIAL HISTORY:     reports that he quit smoking about 2 years ago. His smoking use included cigars. He has never used smokeless tobacco. He reports that he does not drink alcohol and does not use drugs.  ADVANCE DIRECTIVES:    CODE STATUS:   PAST MEDICAL HISTORY: Past Medical History:  Diagnosis Date   Acute heart failure (HCC) 04/08/2021   Acute HFrEF (heart failure with reduced ejection fraction) (HCC) 04/07/2021   Arthritis    hands, elbows bilaterally   Cancer (HCC)    Full dentures    Hypertension    Pt states he doen not have HTN and has never been treated for HTN   Kidney stone on left side last stone 4-5 yrs ago   recurrent (3 episodes)   Panic disorder    pt states  has resolved   Syncope    resolved- was related to panic attacks    PAST SURGICAL HISTORY:  Past Surgical History:  Procedure Laterality Date   CARPAL METACARPAL FUSION WITH DISTAL RADIAL BONE GRAFT Left 05/30/2013   Procedure: LEFT THUMB METACARPAL JOINT FUSION;  Surgeon: [name], MD;  Location: [facility];  Service: Orthopedics;  Laterality: Left;   ESOPHAGOGASTRODUODENOSCOPY N/A 02/12/2014   Procedure: ESOPHAGOGASTRODUODENOSCOPY (EGD);  Surgeon: [name], MD;  Location: [facility] ENDOSCOPY;  Service: Endoscopy;  Laterality: N/A;   FOREIGN BODY REMOVAL N/A 02/12/2014   Procedure: FOREIGN BODY REMOVAL;  Surgeon: [name], MD;  Location: [facility] ENDOSCOPY;  Service: Endoscopy;  Laterality: N/A;   OPEN REDUCTION INTERNAL FIXATION (ORIF) DISTAL RADIAL FRACTURE Left 11/29/2012   Procedure: OPEN REDUCTION INTERNAL FIXATION (ORIF) DISTAL RADIAL FRACTURE;  Surgeon: [name], MD;  Location: [facility];  Service: Orthopedics;  Laterality: Left;  LEFT DISTAL RADIUS OSTEOTOMY WITH BONE GRAFT   RIGHT/LEFT HEART CATH AND CORONARY ANGIOGRAPHY N/A 04/09/2021   Procedure: RIGHT/LEFT HEART CATH AND CORONARY ANGIOGRAPHY;  Surgeon: [name], MD;  [facility];  Service: Cardiovascular;  Laterality: N/A;   TENDON REPAIR Left 02/07/2013   Procedure: LEFT EXTENSOR INDICIS PROPRIUS  TO EXTENSOR POLLICIS LONGUS TENDON TRANSFER;  Surgeon: [name], MD;  Location: [facility];  Service: Orthopedics;  Laterality: Left;   WISDOM TOOTH EXTRACTION     WRIST SURGERY  fx only    HEMATOLOGY/ONCOLOGY HISTORY:  Oncology History  Waldenstrom macroglobulinemia (HCC)  05/24/2021 Initial Diagnosis   Waldenstrom macroglobulinemia (HCC)   06/05/2021 - 12/30/2021 Chemotherapy   Patient is on Treatment Plan : NON-HODGKINS LYMPHOMA Rituximab q7d       ALLERGIES:  is allergic to rituxan [rituximab] and hydrocodone.  MEDICATIONS:   Current Outpatient Medications  Medication Sig Dispense Refill   acetaminophen (TYLENOL) 500 MG tablet Take 500 mg by mouth every 6 (six) hours as needed. (Patient not taking: Reported on 01/10/2023)     albuterol (PROVENTIL HFA) 108 (90 Base) MCG/ACT inhaler Inhale 2 puffs into the lungs every 6 hours as needed for wheezing or shortness of breath 6.7 g 2   carvedilol (COREG) 3.125 MG tablet Take 1 tablet by mouth 2 times daily. 60 tablet 6   clobetasol cream (TEMOVATE) 0.05 % Apply 1 Application topically 2 (two) times daily. Apply topically to the arms and hands twice daily for up to two weeks 30 g 2   dapagliflozin propanediol (FARXIGA) 10 MG TABS tablet Take 1 tablet (10 mg total) by mouth daily before breakfast. 30 tablet 11   diazepam (VALIUM) 5 MG tablet Take 1 tablet (5 mg) by mouth every 12 hours as needed for anxiety. 60 tablet 0   dronabinol (MARINOL) 5 MG capsule Take 1 capsule (5 mg) by mouth 2 times daily before a meal. 60 capsule 0   eplerenone (INSPRA) 25 MG tablet Take 0.5 tablets (12.5 mg total) by mouth daily. 15 tablet 3   ferrous sulfate (FEROSUL) 325 (65 FE) MG tablet Take 1 tablet by mouth daily with breakfast. Please take with a source of Vitamin C 90 tablet 3   ibrutinib (IMBRUVICA) 420 MG tablet Take 1 tablet (420 mg total) by mouth daily. 28 tablet 2   methadone (DOLOPHINE) 10 MG tablet Take 1 tablet (10 mg) by mouth every 8 hours. 90 tablet 0   naloxone (NARCAN) nasal spray 4 mg/0.1 mL Take for overdose 1 each 0   naloxone (NARCAN) nasal spray 4 mg/0.1 mL Take for overdose as directed 2 each 0   ondansetron (ZOFRAN) 8 MG tablet Take 1 tablet (8 mg) by mouth every 8 hours as needed. 30 tablet 0   Oxycodone HCl 10 MG TABS Take 1 tablet (10 mg) by mouth every 4 hours as needed. 90 tablet 0   potassium chloride SA (KLOR-CON M) 20 MEQ tablet Take 1 tablet (20 mEq total) by mouth daily. 30 tablet 1   sacubitril-valsartan (ENTRESTO) 24-26 MG Take 1 tablet by mouth 2 (two) times  daily. 180 tablet 3   sulfamethoxazole-trimethoprim (BACTRIM DS) 800-160 MG tablet Take 1 tablet by mouth 2 (two) times daily. 20 tablet 0   No current facility-administered medications for this visit.    VITAL SIGNS: There were no vitals taken for this visit. There were no vitals filed for this visit.     Estimated body mass index is 20.83 kg/m as calculated from the following:   Height as of 05/19/23: 5\' 8"  (1.727 m).   Weight as of 05/19/23: 137 lb (62.1 kg).   PERFORMANCE STATUS (ECOG) : 1 - Symptomatic but completely ambulatory  Assessment NAD RRR Normal breathing pattern  AAO X4  IMPRESSION:    Neoplasm related pain Andrewjoseph reports overall pain is well controlled. Some days are better than others.   We discussed regimen:Methadone 10mg  three times daily. Oxycodone 10mg  as needed for breakthrough pain. Taking as prescribed. Reill request within appropriate  timing.   We will continue close monitoring and adjust as needed.  No changes to current regimen at this time.  Constipation Controlled with diet and daily MiraLAX.  Appetite/decreased appetite.  Much improved. Weight  132lbs up from 130lbs on 6/12, 129lbs on 5/17.  4. Anxiety/Anxiousness  Wife has expressed concerns regarding some component of depression. Mr. Delira shares that he may have some depression however feels that working outside in the warm temperatures and just overall fatigue from his cancer is playing a large roll in how he feels.   He was previously referred to social work and Orthoptist for spiritual support in the past however was never able to connect due to missed visits or not feeling well. Encouraged to reconnect.   We also dicussed previous use of Lexapro however he declines to restart.   Anxiety controlled with Valium.   5. Goals of care   I created space and opportunity for Mr. Hammers to share his thoughts and feelings regarding his current health and condition. He is realistic speaking to  his understanding of Christian faith. He is taking things one day at a time preparing himself for when his time comes. Until then he wishes to continue treating the treatable allowing himself every opportunity to thrive. He knows at anytime he can discuss with his medical team his wishes and focus on his comfort.   (5/16) Mr. Scharpf shares his realistic understanding of his condition. He and his wife are making sure all "affairs" are in order as his biggest worry is leaving his wife in a financial constraint. Reports communicating with his life insurance policy etc. Emotional support provided.    He is emotional expressing racing thoughts during idol time or interruptions in his sleep as his mind wonders thinking about worst case scenario. Is requesting something to assist with sleeping and anxiety. Acknowledged his request. Education provided on limited use in collaboration with other medications including zyprexa. He verbalized understanding and aware  we will continue to support and monitor for needs.      PLAN: Methadone 10 mg every 8 hours. Close monitoring. Tolerating well. Pain controlled. Oxycodone 10 mg as needed for breakthrough pain.  Miralax daily for constipation Valium 5mg  at twice daily. Zofran as needed for nausea as prescribed. Zofran dose while in office. Marinol for appetite has been on Scientist, clinical (histocompatibility and immunogenetics). Unable to get at this time.  I will plan to see patient back in 4-6 weeks in collaboration to other oncology appointments.    Patient expressed understanding and was in agreement with this plan. He also understands that He can call the clinic at any time with any questions, concerns, or complaints.    Any controlled substances utilized were prescribed in the context of palliative care. PDMP has been reviewed.    Visit consisted of counseling and education dealing with the complex and emotionally intense issues of symptom management and palliative care in the setting of  serious and potentially life-threatening illness.Greater than 50%  of this time was spent counseling and coordinating care related to the above assessment and plan.  Willette Alma, AGPCNP-BC  Palliative Medicine Team/[facility]  *Please note that this is a verbal dictation therefore any spelling or grammatical errors are due to the "Dragon Medical One" system interpretation.

## 2023-05-24 ENCOUNTER — Other Ambulatory Visit (HOSPITAL_COMMUNITY): Payer: Self-pay

## 2023-05-24 ENCOUNTER — Encounter (HOSPITAL_COMMUNITY): Payer: Self-pay

## 2023-05-24 ENCOUNTER — Ambulatory Visit (HOSPITAL_COMMUNITY)
Admission: EM | Admit: 2023-05-24 | Discharge: 2023-05-24 | Disposition: A | Discharge status: Home / Self Care | Payer: Medicaid Other | Attending: Emergency Medicine | Admitting: Emergency Medicine

## 2023-05-24 DIAGNOSIS — L02811 Cutaneous abscess of head [any part, except face]: Secondary | ICD-10-CM | POA: Diagnosis not present

## 2023-05-24 LAB — AEROBIC CULTURE W GRAM STAIN (SUPERFICIAL SPECIMEN): Gram Stain: NONE SEEN

## 2023-05-24 MED ORDER — AMOXICILLIN-POT CLAVULANATE 875-125 MG PO TABS
1.0000 | ORAL_TABLET | Freq: Two times a day (BID) | ORAL | 0 refills | Status: DC
  Filled 2023-05-24: qty 14, 7d supply, fill #0

## 2023-05-24 MED ORDER — CHLORHEXIDINE GLUCONATE 4 % EX SOLN
Freq: Every day | CUTANEOUS | 0 refills | Status: DC | PRN
Start: 2023-05-24 — End: 2024-03-22
  Filled 2023-05-24: qty 237, 30d supply, fill #0

## 2023-05-24 NOTE — Discharge Instructions (Signed)
I did not appreciate any packing today.  Keep this clean with chlorhexidine soap and water.  Change the dressing once a day.  I have started you on Augmentin in addition to the Bactrim to broaden your antibiotic coverage.  I have sent a culture off to make sure that we have you on the correct antibiotic.  I have also put an urgent referral to the wound center on [facility] if you cannot be seen by your dermatologist.  Go to the ER for fevers above 100.4, if this gets worse, or for other concerns.

## 2023-05-24 NOTE — ED Triage Notes (Addendum)
Pt presents for a follow up on a wound. States he wants someone to change the packing. Reports he has hx of cancer.

## 2023-05-24 NOTE — ED Provider Notes (Signed)
HPI  SUBJECTIVE:  Connor Solomon is a 62 y.o. male who presents with for a wound check and packing removal.  Patient was seen here on 8/8 for a large abscess on his mid lower scalp starting 2 weeks ago.  He states that his oncologist tried to get him in with dermatology, but he was unable to be seen, so he presented to the urgent care.  He had an I&D, and states that it drained thick, cottage cheeselike material initially.  It was packed.  He was sent home with Bactrim DS twice daily for 10 days.  He returned here on 8/10, where it measured about 1.5 cm diameter, packing was removed.  ENT referral put in, and he was advised to see a head and neck surgeon given its appearance. referral note states that he needs to go to [facility] for this.  Patient reports decreased pain, no change in his purulent bloody drainage.  He states that it has gotten bigger.  He states the Bactrim is helping.  Symptoms are worse with palpation.  He has a past medical history of heart failure, Waldenstrm macro globinemia on oral chemotherapy, no history of MRSA.  PCP: [facility].  Dermatology at [facility].  Past Medical History:  Diagnosis Date   Acute heart failure (HCC) 04/08/2021   Acute HFrEF (heart failure with reduced ejection fraction) (HCC) 04/07/2021   Arthritis    hands, elbows bilaterally   Cancer (HCC)    Full dentures    Hypertension    Pt states he doen not have HTN and has never been treated for HTN   Kidney stone on left side last stone 4-5 yrs ago   recurrent (3 episodes)   Panic disorder    pt states has resolved   Syncope    resolved- was related to panic attacks    Past Surgical History:  Procedure Laterality Date   CARPAL METACARPAL FUSION WITH DISTAL RADIAL BONE GRAFT Left 05/30/2013   Procedure: LEFT THUMB METACARPAL JOINT FUSION;  Surgeon: [name], MD;  Location: [facility];  Service: Orthopedics;  Laterality: Left;   ESOPHAGOGASTRODUODENOSCOPY  N/A 02/12/2014   Procedure: ESOPHAGOGASTRODUODENOSCOPY (EGD);  Surgeon: [name], MD;  Location: [facility] ENDOSCOPY;  Service: Endoscopy;  Laterality: N/A;   FOREIGN BODY REMOVAL N/A 02/12/2014   Procedure: FOREIGN BODY REMOVAL;  Surgeon: [name], MD;  Location: [facility] ENDOSCOPY;  Service: Endoscopy;  Laterality: N/A;   OPEN REDUCTION INTERNAL FIXATION (ORIF) DISTAL RADIAL FRACTURE Left 11/29/2012   Procedure: OPEN REDUCTION INTERNAL FIXATION (ORIF) DISTAL RADIAL FRACTURE;  Surgeon: [name], MD;  Location: [facility];  Service: Orthopedics;  Laterality: Left;  LEFT DISTAL RADIUS OSTEOTOMY WITH BONE GRAFT   RIGHT/LEFT HEART CATH AND CORONARY ANGIOGRAPHY N/A 04/09/2021   Procedure: RIGHT/LEFT HEART CATH AND CORONARY ANGIOGRAPHY;  Surgeon: [name], MD;  [facility];  Service: Cardiovascular;  Laterality: N/A;   TENDON REPAIR Left 02/07/2013   Procedure: LEFT EXTENSOR INDICIS PROPRIUS  TO EXTENSOR POLLICIS LONGUS TENDON TRANSFER;  Surgeon: [name], MD;  Location: [facility];  Service: Orthopedics;  Laterality: Left;   WISDOM TOOTH EXTRACTION     WRIST SURGERY     fx only    Family History  Problem Relation Age of Onset   Heart attack Father    Sudden Cardiac Death Father 19   Diabetes Neg Hx    Hyperlipidemia Neg Hx    Hypertension  Neg Hx     Social History   Tobacco Use   Smoking status: Former    Types: Cigars    Quit date: 10/11/2020    Years since quitting: 2.6   Smokeless tobacco: Never   Tobacco comments:    smokes 1 cigar a week  Vaping Use   Vaping status: Never Used  Substance Use Topics   Alcohol use: No   Drug use: No    No current facility-administered medications for this encounter.  Current Outpatient Medications:    amoxicillin-clavulanate (AUGMENTIN) 875-125 MG tablet, Take 1 tablet by mouth every 12 (twelve) hours., Disp: 14 tablet, Rfl: 0   chlorhexidine (HIBICLENS) 4 % external liquid,  Apply topically daily as needed. Dilute with water and clean wound with this.  Rinse thoroughly afterwards, Disp: 120 mL, Rfl: 0   acetaminophen (TYLENOL) 500 MG tablet, Take 500 mg by mouth every 6 (six) hours as needed. (Patient not taking: Reported on 01/10/2023), Disp: , Rfl:    albuterol (PROVENTIL HFA) 108 (90 Base) MCG/ACT inhaler, Inhale 2 puffs into the lungs every 6 hours as needed for wheezing or shortness of breath, Disp: 6.7 g, Rfl: 2   carvedilol (COREG) 3.125 MG tablet, Take 1 tablet by mouth 2 times daily., Disp: 60 tablet, Rfl: 6   clobetasol cream (TEMOVATE) 0.05 %, Apply 1 Application topically 2 (two) times daily. Apply topically to the arms and hands twice daily for up to two weeks, Disp: 30 g, Rfl: 2   dapagliflozin propanediol (FARXIGA) 10 MG TABS tablet, Take 1 tablet (10 mg total) by mouth daily before breakfast., Disp: 30 tablet, Rfl: 11   diazepam (VALIUM) 5 MG tablet, Take 1 tablet (5 mg) by mouth every 12 hours as needed for anxiety., Disp: 60 tablet, Rfl: 0   dronabinol (MARINOL) 5 MG capsule, Take 1 capsule (5 mg) by mouth 2 times daily before a meal., Disp: 60 capsule, Rfl: 0   eplerenone (INSPRA) 25 MG tablet, Take 0.5 tablets (12.5 mg total) by mouth daily., Disp: 15 tablet, Rfl: 3   ferrous sulfate (FEROSUL) 325 (65 FE) MG tablet, Take 1 tablet by mouth daily with breakfast. Please take with a source of Vitamin C, Disp: 90 tablet, Rfl: 3   ibrutinib (IMBRUVICA) 420 MG tablet, Take 1 tablet (420 mg total) by mouth daily., Disp: 28 tablet, Rfl: 2   methadone (DOLOPHINE) 10 MG tablet, Take 1 tablet (10 mg) by mouth every 8 hours., Disp: 90 tablet, Rfl: 0   naloxone (NARCAN) nasal spray 4 mg/0.1 mL, Take for overdose, Disp: 1 each, Rfl: 0   naloxone (NARCAN) nasal spray 4 mg/0.1 mL, Take for overdose as directed, Disp: 2 each, Rfl: 0   ondansetron (ZOFRAN) 8 MG tablet, Take 1 tablet (8 mg) by mouth every 8 hours as needed., Disp: 30 tablet, Rfl: 0   Oxycodone HCl 10 MG  TABS, Take 1 tablet (10 mg) by mouth every 4 hours as needed., Disp: 90 tablet, Rfl: 0   potassium chloride SA (KLOR-CON M) 20 MEQ tablet, Take 1 tablet (20 mEq total) by mouth daily., Disp: 30 tablet, Rfl: 1   sacubitril-valsartan (ENTRESTO) 24-26 MG, Take 1 tablet by mouth 2 (two) times daily., Disp: 180 tablet, Rfl: 3   sulfamethoxazole-trimethoprim (BACTRIM DS) 800-160 MG tablet, Take 1 tablet by mouth 2 (two) times daily., Disp: 20 tablet, Rfl: 0  Allergies  Allergen Reactions   Rituxan [Rituximab] Other (See Comments) and Cough    Mild chest discomfort & rigors  Hydrocodone Itching and Nausea And Vomiting     ROS  As noted in HPI.   Physical Exam  BP 132/77   Pulse 68   Temp 98.3 F (36.8 C) (Oral)   Resp 18   SpO2 98%   Constitutional: Well developed, well nourished, no acute distress Eyes:  EOMI, conjunctiva normal bilaterally HENT: Normocephalic, atraumatic,mucus membranes moist Respiratory: Normal inspiratory effort Cardiovascular: Normal rate GI: nondistended skin:  3 x 3 cm tender cyst like mass with extensive purulent drainage on posterior scalp, no appreciable packing in place     Picture from patient's phone on the day it was I&D'd       Musculoskeletal: no deformities Neurologic: Alert & oriented x 3, no focal neuro deficits Psychiatric: Speech and behavior appropriate   ED Course   Medications - No data to display  Orders Placed This Encounter  Procedures   Aerobic Culture w Gram Stain (superficial specimen)    Standing Status:   Standing    Number of Occurrences:   1    Order Specific Question:   Patient immune status    Answer:   Immunocompromised   AMB referral to wound care center    Referral Priority:   Urgent    Referral Type:   Consultation    Referral Reason:   Specialty Services Required    Number of Visits Requested:   1   Apply dressing    Standing Status:   Standing    Number of Occurrences:   1    No results found.  However, due to the size of the patient record, not all encounters were searched. Please check Results Review for a complete set of results. No results found.  ED Clinical Impression  1. Abscess, scalp      ED Assessment/Plan     Previous records reviewed.  As noted in HPI.  Will send wound culture off as it still appears to be quite infected and larger.  I was unable to find any packing in the wound.  I assume that it has come out.  I will start him on Augmentin p.o. twice daily for 7 days to broaden coverage in addition to having him continue the Bactrim.  Will refer to wound center as he is immunocompromised and I anticipate this may take some time to heal.  Alternatively, he can follow-up with his dermatologist or with a head and neck surgeon at [facility] a chat and inbox note to his dermatologist requesting follow-up if appropriate.  Discussed MDM, treatment plan, and plan for follow-up with patient. Discussed sn/sx that should prompt return to the ED. patient agrees with plan.   Meds ordered this encounter  Medications   amoxicillin-clavulanate (AUGMENTIN) 875-125 MG tablet    Sig: Take 1 tablet by mouth every 12 (twelve) hours.    Dispense:  14 tablet    Refill:  0   chlorhexidine (HIBICLENS) 4 % external liquid    Sig: Apply topically daily as needed. Dilute with water and clean wound with this.  Rinse thoroughly afterwards    Dispense:  120 mL    Refill:  0      *This clinic note was created using Scientist, clinical (histocompatibility and immunogenetics). Therefore, there may be occasional mistakes despite careful proofreading.  ?    Domenick Gong, MD 05/24/23 [phone]

## 2023-05-25 ENCOUNTER — Inpatient Hospital Stay: Payer: Medicaid Other | Admitting: Nurse Practitioner

## 2023-05-25 ENCOUNTER — Other Ambulatory Visit (HOSPITAL_COMMUNITY): Payer: Self-pay

## 2023-05-26 ENCOUNTER — Ambulatory Visit: Payer: Medicaid Other | Admitting: Dermatology

## 2023-05-27 ENCOUNTER — Telehealth: Payer: Self-pay

## 2023-05-27 NOTE — Telephone Encounter (Signed)
Pharmacy Patient Advocate Encounter   Received notification from CoverMyMeds that prior authorization for ALBUTEROL INHALER (PROVENTIL) is required/requested.   Insurance verification completed.   The patient is insured through [facility] [place] .   Per test claim:  PROAIR, VENTOLIN, & XOPENEX is preferred by the ins.  If suggested medication is appropriate, Please send in a new RX and discontinue this one. If not, please advise as to why it's not appropriate so that we may request a Prior Authorization.

## 2023-05-28 ENCOUNTER — Other Ambulatory Visit: Payer: Self-pay | Admitting: Nurse Practitioner

## 2023-05-28 ENCOUNTER — Telehealth (HOSPITAL_COMMUNITY): Payer: Self-pay | Admitting: Emergency Medicine

## 2023-05-28 ENCOUNTER — Other Ambulatory Visit (HOSPITAL_COMMUNITY): Payer: Self-pay

## 2023-05-28 DIAGNOSIS — G893 Neoplasm related pain (acute) (chronic): Secondary | ICD-10-CM

## 2023-05-28 DIAGNOSIS — Z515 Encounter for palliative care: Secondary | ICD-10-CM

## 2023-05-28 DIAGNOSIS — C880 Waldenstrom macroglobulinemia: Secondary | ICD-10-CM

## 2023-05-28 MED ORDER — DOXYCYCLINE HYCLATE 100 MG PO CAPS: 100.0000 mg | ORAL_CAPSULE | Freq: Two times a day (BID) | ORAL | 0 refills | Status: AC

## 2023-05-28 NOTE — Telephone Encounter (Signed)
Results for orders placed or performed during the hospital encounter of 05/24/23  Aerobic Culture w Gram Stain (superficial specimen)   Specimen: SCALP; Wound  Result Value Ref Range   Specimen Description SCALP    Special Requests Immunocompromised    Gram Stain      NO WBC SEEN RARE GRAM POSITIVE COCCI IN PAIRS Performed at [facility], [address], [place], [place] [address]    Culture      MODERATE METHICILLIN RESISTANT STAPHYLOCOCCUS AUREUS   Report Status 05/27/2023 FINAL    Organism ID, Bacteria METHICILLIN RESISTANT STAPHYLOCOCCUS AUREUS       Susceptibility   Methicillin resistant staphylococcus aureus - MIC*    CIPROFLOXACIN >=8 RESISTANT Resistant     ERYTHROMYCIN >=8 RESISTANT Resistant     GENTAMICIN <=0.5 SENSITIVE Sensitive     OXACILLIN >=4 RESISTANT Resistant     TETRACYCLINE <=1 SENSITIVE Sensitive     VANCOMYCIN 1 SENSITIVE Sensitive     TRIMETH/SULFA >=320 RESISTANT Resistant     CLINDAMYCIN <=0.25 SENSITIVE Sensitive     RIFAMPIN <=0.5 SENSITIVE Sensitive     Inducible Clindamycin NEGATIVE Sensitive     LINEZOLID 2 SENSITIVE Sensitive     * MODERATE METHICILLIN RESISTANT STAPHYLOCOCCUS AUREUS   *Note: Due to a large number of results and/or encounters for the requested time period, some results have not been displayed. A complete set of results can be found in Results Review.   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  He was placed on Bactrim and Augmentin.  Discontinuing both and e-prescribing doxycycline 100 mg p.o. twice daily to 7 days to [facility] [address]   Sent patient MyChart note notifying him of result and of prescription waiting [facility] at the [facility] at [address]

## 2023-05-30 ENCOUNTER — Other Ambulatory Visit (HOSPITAL_COMMUNITY): Payer: Self-pay

## 2023-05-30 ENCOUNTER — Other Ambulatory Visit: Payer: Self-pay

## 2023-05-30 ENCOUNTER — Other Ambulatory Visit: Payer: Self-pay | Admitting: Student

## 2023-05-30 MED ORDER — METHADONE HCL 10 MG PO TABS
10.0000 mg | ORAL_TABLET | Freq: Three times a day (TID) | ORAL | 0 refills | Status: DC
Start: 2023-05-30 — End: 2023-06-26
  Filled 2023-05-30 – 2023-05-31 (×2): qty 90, 30d supply, fill #0

## 2023-05-30 MED ORDER — ALBUTEROL SULFATE HFA 108 (90 BASE) MCG/ACT IN AERS: 2.0000 | INHALATION_SPRAY | Freq: Four times a day (QID) | RESPIRATORY_TRACT | 2 refills | Status: DC | PRN

## 2023-05-31 ENCOUNTER — Other Ambulatory Visit (HOSPITAL_COMMUNITY): Payer: Self-pay

## 2023-06-01 ENCOUNTER — Telehealth (HOSPITAL_COMMUNITY): Payer: Self-pay

## 2023-06-01 ENCOUNTER — Other Ambulatory Visit (HOSPITAL_COMMUNITY): Payer: Self-pay

## 2023-06-01 ENCOUNTER — Encounter: Payer: Medicaid Other | Admitting: Internal Medicine

## 2023-06-01 NOTE — Telephone Encounter (Signed)
Advanced Heart Failure Patient Advocate Encounter  Prior authorization for Marcelline Deist has been submitted and approved. Test billing returns $4 for 30 day supply.  Key: [id number] Effective: 05/18/2023 to 05/31/2024  Burnell Blanks, CPhT Rx Patient Advocate Phone: [phone]

## 2023-06-02 ENCOUNTER — Ambulatory Visit (INDEPENDENT_AMBULATORY_CARE_PROVIDER_SITE_OTHER): Payer: Medicaid Other | Admitting: Dermatology

## 2023-06-02 DIAGNOSIS — L98499 Non-pressure chronic ulcer of skin of other sites with unspecified severity: Secondary | ICD-10-CM | POA: Diagnosis not present

## 2023-06-02 DIAGNOSIS — D485 Neoplasm of uncertain behavior of skin: Secondary | ICD-10-CM

## 2023-06-02 DIAGNOSIS — L0889 Other specified local infections of the skin and subcutaneous tissue: Secondary | ICD-10-CM | POA: Diagnosis not present

## 2023-06-02 DIAGNOSIS — L089 Local infection of the skin and subcutaneous tissue, unspecified: Secondary | ICD-10-CM

## 2023-06-02 DIAGNOSIS — C880 Waldenstrom macroglobulinemia: Secondary | ICD-10-CM

## 2023-06-02 DIAGNOSIS — B9562 Methicillin resistant Staphylococcus aureus infection as the cause of diseases classified elsewhere: Secondary | ICD-10-CM

## 2023-06-02 NOTE — Progress Notes (Signed)
Follow-Up Visit   Subjective  Connor Solomon is a 62 y.o. male who presents for the following: Abscess  Patient present today for Emergent add-on visit for Abscess. Patient was seen in the ED initially on 05/19/23.Size originally measured 5 mm. I & D completed and patient was prescribed Bactrim DS to take 2 times daily for 10 days. Patient states his last dose was yesterday. Patient returned for reassessment at ED on 05/21/23. At that visit packing was removed and the Abscess measured 1.5 cm & pt instructed to continue the abx and they planned to refer to ENT for evaluation d/t appearance. While in the ED Abscess it measured about 1.5 cm diameter, packing was removed.  ENT referral put in, and he was advised to see a head and neck surgeon given its appearance. referral note states that he needs to go to [facility] for this.Patient returned to UC on 05/24/23 d/t worsening sxs. The wound was cultured and determined it is MRSA. He was then prescribed Doxycycline. Patient reports sxs are not at goal. Patient reports no medication changes. Patient rates his pain today 7 out 10. Patient denies drainage from the site and his last dressing was Monday.   The following portions of the chart were reviewed this encounter and updated as appropriate: medications, allergies, medical history  Review of Systems:  No other skin or systemic complaints except as noted in HPI or Assessment and Plan.  Objective  Well appearing patient in no apparent distress; mood and affect are within normal limits.  A focused examination was performed of the following areas: Posterior Lower Neck  Relevant exam findings are noted in the Assessment and Plan.  Scalp 3 cm ulceration with rolled border                Assessment & Plan   1. Suspected Basal Cell Carcinoma vs. Pyoderma Gangrenosum    - Assessment: A 2.5 cm ulceration with a rolled border observed at the base of the lesion. Biopsy performed and awaiting lab results,  expected in approximately one week.    - Plan: If confirmed as skin cancer, refer the patient to a surgeon for further management.  2. Infection (MRSA)    - Assessment: Patient has been on doxycycline for three days (two pills a day) following the failure of the first antibiotic to kill MRSA.    - Plan: Continue doxycycline for a total of one week.  3. Wound Care    - Assessment: Advised use of hydrogen peroxide only if a crusty scab forms. Prescription for mupirocin ointment sent; apply a thick layer daily after showering. Advised to clean the wound with regular soap and water, as Hibiclens may be irritating.    - Plan: Continue current wound care regimen.  4. Waldenstrom's Macroglobulinemia    - Assessment: Patient is currently on chemotherapy pills for this condition.    - Plan: No changes to the treatment plan were made during this visit.  Follow-up: - The patient will be contacted with biopsy results. A follow-up appointment will be scheduled based on the results: if skin cancer is confirmed, the patient will be referred to a colleague for the procedure; if not, the patient will return for wound care management.     Neoplasm of uncertain behavior of skin Scalp  Skin / nail biopsy Type of biopsy: tangential   Informed consent: discussed and consent obtained   Timeout: patient name, date of birth, surgical site, and procedure verified   Procedure  prep:  Patient was prepped and draped in usual sterile fashion Prep type:  Isopropyl alcohol Anesthesia: the lesion was anesthetized in a standard fashion   Anesthetic:  1% lidocaine w/ epinephrine 1-100,000 buffered w/ 8.4% NaHCO3 Instrument used: DermaBlade   Hemostasis achieved with: aluminum chloride   Outcome: patient tolerated procedure well   Post-procedure details: sterile dressing applied and wound care instructions given   Dressing type: petrolatum gauze and bandage      No follow-ups on file.    Documentation: I have  reviewed the above documentation for accuracy and completeness, and I agree with the above.  Stasia Cavalier, am acting as scribe for Langston Reusing, DO.  Langston Reusing, DO

## 2023-06-02 NOTE — Patient Instructions (Addendum)

## 2023-06-03 ENCOUNTER — Other Ambulatory Visit (HOSPITAL_COMMUNITY): Payer: Self-pay

## 2023-06-06 ENCOUNTER — Other Ambulatory Visit: Payer: Self-pay | Admitting: Nurse Practitioner

## 2023-06-06 ENCOUNTER — Telehealth: Payer: Self-pay

## 2023-06-06 ENCOUNTER — Ambulatory Visit (HOSPITAL_BASED_OUTPATIENT_CLINIC_OR_DEPARTMENT_OTHER): Payer: Medicaid Other | Admitting: Internal Medicine

## 2023-06-06 ENCOUNTER — Other Ambulatory Visit: Payer: Self-pay | Admitting: Dermatology

## 2023-06-06 ENCOUNTER — Other Ambulatory Visit (HOSPITAL_COMMUNITY): Payer: Self-pay

## 2023-06-06 ENCOUNTER — Other Ambulatory Visit: Payer: Self-pay

## 2023-06-06 ENCOUNTER — Other Ambulatory Visit: Payer: Self-pay | Admitting: Hematology and Oncology

## 2023-06-06 DIAGNOSIS — C880 Waldenstrom macroglobulinemia: Secondary | ICD-10-CM

## 2023-06-06 DIAGNOSIS — G893 Neoplasm related pain (acute) (chronic): Secondary | ICD-10-CM

## 2023-06-06 DIAGNOSIS — R110 Nausea: Secondary | ICD-10-CM

## 2023-06-06 DIAGNOSIS — L309 Dermatitis, unspecified: Secondary | ICD-10-CM

## 2023-06-06 DIAGNOSIS — Z515 Encounter for palliative care: Secondary | ICD-10-CM

## 2023-06-06 MED ORDER — ONDANSETRON HCL 8 MG PO TABS
8.0000 mg | ORAL_TABLET | Freq: Three times a day (TID) | ORAL | 0 refills | Status: DC | PRN
Start: 2023-06-06 — End: 2024-03-22
  Filled 2023-06-06: qty 30, 10d supply, fill #0

## 2023-06-06 MED ORDER — OXYCODONE HCL 10 MG PO TABS
10.0000 mg | ORAL_TABLET | ORAL | 0 refills | Status: DC | PRN
Start: 2023-06-06 — End: 2023-06-17
  Filled 2023-06-06: qty 90, 15d supply, fill #0

## 2023-06-06 NOTE — Telephone Encounter (Signed)
Patient needs an appointment

## 2023-06-06 NOTE — Telephone Encounter (Signed)
Pt called asking if he had an appt sched. I l/m for him that he does not have anything sched but can call back to set something up if needed. I let him know dr. Onalee Hua is out of the office this week but he could send [place] a mychart message with any questions

## 2023-06-07 ENCOUNTER — Other Ambulatory Visit (HOSPITAL_COMMUNITY): Payer: Self-pay

## 2023-06-07 MED ORDER — POTASSIUM CHLORIDE CRYS ER 20 MEQ PO TBCR
20.0000 meq | EXTENDED_RELEASE_TABLET | Freq: Every day | ORAL | 1 refills | Status: DC
  Filled 2023-06-07: qty 30, 30d supply, fill #0
  Filled 2023-08-18: qty 30, 30d supply, fill #1

## 2023-06-09 ENCOUNTER — Telehealth: Payer: Self-pay

## 2023-06-09 NOTE — Telephone Encounter (Signed)
Pt called wanting his bx results. I called back and l/m that dr. Onalee Hua is out of the office but once they are reviewed, we will contact him with the results and any follow up that is needed

## 2023-06-12 DIAGNOSIS — Z419 Encounter for procedure for purposes other than remedying health state, unspecified: Secondary | ICD-10-CM | POA: Diagnosis not present

## 2023-06-14 ENCOUNTER — Other Ambulatory Visit (HOSPITAL_COMMUNITY): Payer: Self-pay

## 2023-06-14 ENCOUNTER — Other Ambulatory Visit: Payer: Self-pay | Admitting: Nurse Practitioner

## 2023-06-14 ENCOUNTER — Other Ambulatory Visit: Payer: Self-pay | Admitting: *Deleted

## 2023-06-14 DIAGNOSIS — G4700 Insomnia, unspecified: Secondary | ICD-10-CM

## 2023-06-14 DIAGNOSIS — Z515 Encounter for palliative care: Secondary | ICD-10-CM

## 2023-06-14 DIAGNOSIS — G893 Neoplasm related pain (acute) (chronic): Secondary | ICD-10-CM

## 2023-06-14 DIAGNOSIS — C8800 Waldenstrom macroglobulinemia not having achieved remission: Secondary | ICD-10-CM

## 2023-06-14 MED ORDER — DIAZEPAM 5 MG PO TABS
5.0000 mg | ORAL_TABLET | Freq: Two times a day (BID) | ORAL | 0 refills | Status: DC | PRN
  Filled 2023-06-14 – 2023-06-16 (×3): qty 60, 30d supply, fill #0

## 2023-06-14 NOTE — Progress Notes (Deleted)
Palliative Medicine [facility]  [phone] [phone]   Name: Connor Solomon Date: 06/14/2023 MRN: [id number]  DOB: November 12, 1960  Patient Care Team: [name], DO as PCP - General Pickenpack-Cousar, [name], NP as Nurse Practitioner (Nurse Practitioner)   REASON FOR CONSULTATION: Connor Solomon is a 62 y.o. 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 Palliative ask to see for symptom management.  SOCIAL HISTORY:     reports that he quit smoking about 2 years ago. His smoking use included cigars. He has never used smokeless tobacco. He reports that he does not drink alcohol and does not use drugs.  ADVANCE DIRECTIVES:    CODE STATUS:   PAST MEDICAL HISTORY: Past Medical History:  Diagnosis Date   Acute heart failure (HCC) 04/08/2021   Acute HFrEF (heart failure with reduced ejection fraction) (HCC) 04/07/2021   Arthritis    hands, elbows bilaterally   Cancer (HCC)    Full dentures    Hypertension    Pt states he doen not have HTN and has never been treated for HTN   Kidney stone on left side last stone 4-5 yrs ago   recurrent (3 episodes)   Panic disorder    pt states has resolved   Syncope    resolved- was related to panic attacks    PAST SURGICAL HISTORY:  Past Surgical History:  Procedure Laterality Date   CARPAL METACARPAL FUSION WITH DISTAL RADIAL BONE GRAFT Left 05/30/2013   Procedure: LEFT THUMB METACARPAL JOINT FUSION;  Surgeon: [name], MD;  Location: [facility];  Service: Orthopedics;  Laterality: Left;   ESOPHAGOGASTRODUODENOSCOPY N/A 02/12/2014   Procedure: ESOPHAGOGASTRODUODENOSCOPY (EGD);  Surgeon: [name], MD;  Location: [facility] ENDOSCOPY;  Service: Endoscopy;  Laterality: N/A;   FOREIGN BODY REMOVAL N/A 02/12/2014   Procedure: FOREIGN BODY REMOVAL;  Surgeon: [name], MD;  Location: [facility]  ENDOSCOPY;  Service: Endoscopy;  Laterality: N/A;   OPEN REDUCTION INTERNAL FIXATION (ORIF) DISTAL RADIAL FRACTURE Left 11/29/2012   Procedure: OPEN REDUCTION INTERNAL FIXATION (ORIF) DISTAL RADIAL FRACTURE;  Surgeon: [name], MD;  Location: [facility];  Service: Orthopedics;  Laterality: Left;  LEFT DISTAL RADIUS OSTEOTOMY WITH BONE GRAFT   RIGHT/LEFT HEART CATH AND CORONARY ANGIOGRAPHY N/A 04/09/2021   Procedure: RIGHT/LEFT HEART CATH AND CORONARY ANGIOGRAPHY;  Surgeon: [name], MD;  Location: [facility];  Service: Cardiovascular;  Laterality: N/A;   TENDON REPAIR Left 02/07/2013   Procedure: LEFT EXTENSOR INDICIS PROPRIUS  TO EXTENSOR POLLICIS LONGUS TENDON TRANSFER;  Surgeon: [name], MD;  Location: [facility];  Service: Orthopedics;  Laterality: Left;   WISDOM TOOTH EXTRACTION     WRIST SURGERY     fx only    HEMATOLOGY/ONCOLOGY HISTORY:  Oncology History  Waldenstrom macroglobulinemia (HCC)  05/24/2021 Initial Diagnosis   Waldenstrom macroglobulinemia (HCC)   06/05/2021 - 12/30/2021 Chemotherapy   Patient is on Treatment Plan : NON-HODGKINS LYMPHOMA Rituximab q7d       ALLERGIES:  is allergic to rituxan [rituximab] and hydrocodone.  MEDICATIONS:  Current Outpatient Medications  Medication Sig Dispense Refill   acetaminophen (TYLENOL) 500 MG tablet Take 500 mg by mouth every 6 (six) hours as needed. (Patient not taking: Reported on 01/10/2023)     albuterol (PROAIR HFA) 108 (90 Base) MCG/ACT inhaler Inhale 2 puffs into the lungs every 6 (six) hours  as needed for wheezing or shortness of breath. 6.7 g 2   carvedilol (COREG) 3.125 MG tablet Take 1 tablet by mouth 2 times daily. 60 tablet 6   chlorhexidine (HIBICLENS) 4 % external liquid Apply topically daily as needed. Dilute with water and clean wound with this.  Rinse thoroughly afterwards 237 mL 0   clobetasol cream (TEMOVATE) 0.05 % Apply 1 Application topically 2  (two) times daily. Apply topically to the arms and hands twice daily for up to two weeks 30 g 2   dapagliflozin propanediol (FARXIGA) 10 MG TABS tablet Take 1 tablet (10 mg total) by mouth daily before breakfast. 30 tablet 11   diazepam (VALIUM) 5 MG tablet Take 1 tablet (5 mg) by mouth every 12 hours as needed for anxiety. 60 tablet 0   dronabinol (MARINOL) 5 MG capsule Take 1 capsule (5 mg) by mouth 2 times daily before a meal. 60 capsule 0   eplerenone (INSPRA) 25 MG tablet Take 0.5 tablets (12.5 mg total) by mouth daily. 15 tablet 3   ferrous sulfate (FEROSUL) 325 (65 FE) MG tablet Take 1 tablet by mouth daily with breakfast. Please take with a source of Vitamin C 90 tablet 3   ibrutinib (IMBRUVICA) 420 MG tablet Take 1 tablet (420 mg total) by mouth daily. 28 tablet 2   methadone (DOLOPHINE) 10 MG tablet Take 1 tablet (10 mg) by mouth every 8 hours. 90 tablet 0   naloxone (NARCAN) nasal spray 4 mg/0.1 mL Take for overdose 1 each 0   naloxone (NARCAN) nasal spray 4 mg/0.1 mL Take for overdose as directed 2 each 0   ondansetron (ZOFRAN) 8 MG tablet Take 1 tablet (8 mg) by mouth every 8 hours as needed. 30 tablet 0   Oxycodone HCl 10 MG TABS Take 1 tablet (10 mg) by mouth every 4 hours as needed. 90 tablet 0   potassium chloride SA (KLOR-CON M) 20 MEQ tablet Take 1 tablet (20 mEq total) by mouth daily. 30 tablet 1   sacubitril-valsartan (ENTRESTO) 24-26 MG Take 1 tablet by mouth 2 (two) times daily. 180 tablet 3   No current facility-administered medications for this visit.    VITAL SIGNS: There were no vitals taken for this visit. There were no vitals filed for this visit.     Estimated body mass index is 20.83 kg/m as calculated from the following:   Height as of 05/19/23: 5\' 8"  (1.727 m).   Weight as of 05/19/23: 137 lb (62.1 kg).   PERFORMANCE STATUS (ECOG) : 1 - Symptomatic but completely ambulatory  Assessment NAD RRR Normal breathing pattern  AAO  X4  IMPRESSION:    Neoplasm related pain Connor Solomon reports overall pain is well controlled. Some days are better than others.   We discussed regimen:Methadone 10mg  three times daily. Oxycodone 10mg  as needed for breakthrough pain. Taking as prescribed. Reill request within appropriate timing.   We will continue close monitoring and adjust as needed.  No changes to current regimen at this time.  Constipation Controlled with diet and daily MiraLAX.  Appetite/decreased appetite.  Much improved. Weight  132lbs up from 130lbs on 6/12, 129lbs on 5/17.  4. Anxiety/Anxiousness  Wife has expressed concerns regarding some component of depression. Connor Solomon shares that he may have some depression however feels that working outside in the warm temperatures and just overall fatigue from his cancer is playing a large roll in how he feels.   He was previously referred to social work and [employer]  for spiritual support in the past however was never able to connect due to missed visits or not feeling well. Encouraged to reconnect.   We also dicussed previous use of Lexapro however he declines to restart.   Anxiety controlled with Valium.   5. Goals of care   I created space and opportunity for Connor Solomon to share his thoughts and feelings regarding his current health and condition. He is realistic speaking to his understanding of Christian faith. He is taking things one day at a time preparing himself for when his time comes. Until then he wishes to continue treating the treatable allowing himself every opportunity to thrive. He knows at anytime he can discuss with his medical team his wishes and focus on his comfort.   (5/16) Connor Solomon shares his realistic understanding of his condition. He and his wife are making sure all "affairs" are in order as his biggest worry is leaving his wife in a financial constraint. Reports communicating with his life insurance policy etc. Emotional support provided.    He  is emotional expressing racing thoughts during idol time or interruptions in his sleep as his mind wonders thinking about worst case scenario. Is requesting something to assist with sleeping and anxiety. Acknowledged his request. Education provided on limited use in collaboration with other medications including zyprexa. He verbalized understanding and aware  we will continue to support and monitor for needs.      PLAN: Methadone 10 mg every 8 hours. Close monitoring. Tolerating well. Pain controlled. Oxycodone 10 mg as needed for breakthrough pain.  Miralax daily for constipation Valium 5mg  at twice daily. Zofran as needed for nausea as prescribed. Zofran dose while in office. Marinol for appetite has been on Scientist, clinical (histocompatibility and immunogenetics). Unable to get at this time.  I will plan to see patient back in 4-6 weeks in collaboration to other oncology appointments.    Patient expressed understanding and was in agreement with this plan. He also understands that He can call the clinic at any time with any questions, concerns, or complaints.    Any controlled substances utilized were prescribed in the context of palliative care. PDMP has been reviewed.    Visit consisted of counseling and education dealing with the complex and emotionally intense issues of symptom management and palliative care in the setting of serious and potentially life-threatening illness.Greater than 50%  of this time was spent counseling and coordinating care related to the above assessment and plan.  Willette Alma, AGPCNP-BC  Palliative Medicine Team/[facility]  *Please note that this is a verbal dictation therefore any spelling or grammatical errors are due to the "Dragon Medical One" system interpretation.

## 2023-06-15 ENCOUNTER — Inpatient Hospital Stay: Payer: Medicaid Other | Admitting: Physician Assistant

## 2023-06-15 ENCOUNTER — Inpatient Hospital Stay: Payer: Medicaid Other

## 2023-06-15 ENCOUNTER — Inpatient Hospital Stay: Payer: Medicaid Other | Admitting: Nurse Practitioner

## 2023-06-15 ENCOUNTER — Other Ambulatory Visit: Payer: Self-pay | Admitting: Physician Assistant

## 2023-06-15 ENCOUNTER — Telehealth: Payer: Self-pay | Admitting: Hematology and Oncology

## 2023-06-15 ENCOUNTER — Other Ambulatory Visit (HOSPITAL_COMMUNITY): Payer: Self-pay

## 2023-06-15 DIAGNOSIS — C880 Waldenstrom macroglobulinemia: Secondary | ICD-10-CM

## 2023-06-15 NOTE — Progress Notes (Signed)
Hi Connor Solomon  Dr. Onalee Hua reviewed your biopsy results and they showed the spot removed was not cancerous.  Please keep the area clean with gentle soap and water and keep applying a thick layer of the mupirocin ointment every day until it's healed. The detailed report is available to view in MyChart.  Have a great day!  Please make sure you have a follow up appointment with our office for 3 weeks from now so we can check the healing status.   Kind Regards,  Dr. Kermit Balo Care Team

## 2023-06-15 NOTE — Progress Notes (Signed)
Palliative Medicine [facility]  [phone]) [phone] [phone]) [phone]   Name: Connor Solomon Date: 06/15/2023 MRN: [id number]  DOB: 1960/10/17  Patient Care Team: [name], DO as PCP - General Pickenpack-Cousar, [name], NP as Nurse Practitioner (Nurse Practitioner)   REASON FOR CONSULTATION: LEUL LUTY is a 62 y.o. 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 Palliative ask to see for symptom management.  SOCIAL HISTORY:     reports that he quit smoking about 2 years ago. His smoking use included cigars. He has never used smokeless tobacco. He reports that he does not drink alcohol and does not use drugs.  ADVANCE DIRECTIVES:    CODE STATUS:   PAST MEDICAL HISTORY: Past Medical History:  Diagnosis Date   Acute heart failure (HCC) 04/08/2021   Acute HFrEF (heart failure with reduced ejection fraction) (HCC) 04/07/2021   Arthritis    hands, elbows bilaterally   Cancer (HCC)    Full dentures    Hypertension    Pt states he doen not have HTN and has never been treated for HTN   Kidney stone on left side last stone 4-5 yrs ago   recurrent (3 episodes)   Panic disorder    pt states has resolved   Syncope    resolved- was related to panic attacks    PAST SURGICAL HISTORY:  Past Surgical History:  Procedure Laterality Date   CARPAL METACARPAL FUSION WITH DISTAL RADIAL BONE GRAFT Left 05/30/2013   Procedure: LEFT THUMB METACARPAL JOINT FUSION;  Surgeon: [name], MD;  Location: [facility];  Service: Orthopedics;  Laterality: Left;   ESOPHAGOGASTRODUODENOSCOPY N/A 02/12/2014   Procedure: ESOPHAGOGASTRODUODENOSCOPY (EGD);  Surgeon: [name], MD;  Location: [facility] ENDOSCOPY;  Service: Endoscopy;  Laterality: N/A;   FOREIGN BODY REMOVAL N/A 02/12/2014   Procedure: FOREIGN BODY REMOVAL;  Surgeon: [name], MD;  Location: [facility]  ENDOSCOPY;  Service: Endoscopy;  Laterality: N/A;   OPEN REDUCTION INTERNAL FIXATION (ORIF) DISTAL RADIAL FRACTURE Left 11/29/2012   Procedure: OPEN REDUCTION INTERNAL FIXATION (ORIF) DISTAL RADIAL FRACTURE;  Surgeon: [name], MD;  Location: [facility];  Service: Orthopedics;  Laterality: Left;  LEFT DISTAL RADIUS OSTEOTOMY WITH BONE GRAFT   RIGHT/LEFT HEART CATH AND CORONARY ANGIOGRAPHY N/A 04/09/2021   Procedure: RIGHT/LEFT HEART CATH AND CORONARY ANGIOGRAPHY;  Surgeon: [name], MD;  Location: [facility];  Service: Cardiovascular;  Laterality: N/A;   TENDON REPAIR Left 02/07/2013   Procedure: LEFT EXTENSOR INDICIS PROPRIUS  TO EXTENSOR POLLICIS LONGUS TENDON TRANSFER;  Surgeon: [name], MD;  Location: [facility];  Service: Orthopedics;  Laterality: Left;   WISDOM TOOTH EXTRACTION     WRIST SURGERY     fx only    HEMATOLOGY/ONCOLOGY HISTORY:  Oncology History  Waldenstrom macroglobulinemia (HCC)  05/24/2021 Initial Diagnosis   Waldenstrom macroglobulinemia (HCC)   06/05/2021 - 12/30/2021 Chemotherapy   Patient is on Treatment Plan : NON-HODGKINS LYMPHOMA Rituximab q7d       ALLERGIES:  is allergic to rituxan [rituximab] and hydrocodone.  MEDICATIONS:  Current Outpatient Medications  Medication Sig Dispense Refill   acetaminophen (TYLENOL) 500 MG tablet Take 500 mg by mouth every 6 (six) hours as needed. (Patient not taking: Reported on 01/10/2023)     albuterol (PROAIR HFA) 108 (90 Base) MCG/ACT inhaler Inhale 2 puffs into the lungs every 6 (six) hours  as needed for wheezing or shortness of breath. 6.7 g 2   carvedilol (COREG) 3.125 MG tablet Take 1 tablet by mouth 2 times daily. 60 tablet 6   chlorhexidine (HIBICLENS) 4 % external liquid Apply topically daily as needed. Dilute with water and clean wound with this.  Rinse thoroughly afterwards 237 mL 0   clobetasol cream (TEMOVATE) 0.05 % Apply 1 Application topically 2  (two) times daily. Apply topically to the arms and hands twice daily for up to two weeks 30 g 2   dapagliflozin propanediol (FARXIGA) 10 MG TABS tablet Take 1 tablet (10 mg total) by mouth daily before breakfast. 30 tablet 11   [START ON 06/18/2023] diazepam (VALIUM) 5 MG tablet Take 1 tablet (5 mg total) by mouth every 12 (twelve) hours as needed for anxiety. 60 tablet 0   dronabinol (MARINOL) 5 MG capsule Take 1 capsule (5 mg) by mouth 2 times daily before a meal. 60 capsule 0   eplerenone (INSPRA) 25 MG tablet Take 0.5 tablets (12.5 mg total) by mouth daily. 15 tablet 3   ferrous sulfate (FEROSUL) 325 (65 FE) MG tablet Take 1 tablet by mouth daily with breakfast. Please take with a source of Vitamin C 90 tablet 3   ibrutinib (IMBRUVICA) 420 MG tablet Take 1 tablet (420 mg total) by mouth daily. 28 tablet 2   methadone (DOLOPHINE) 10 MG tablet Take 1 tablet (10 mg) by mouth every 8 hours. 90 tablet 0   naloxone (NARCAN) nasal spray 4 mg/0.1 mL Take for overdose 1 each 0   naloxone (NARCAN) nasal spray 4 mg/0.1 mL Take for overdose as directed 2 each 0   ondansetron (ZOFRAN) 8 MG tablet Take 1 tablet (8 mg) by mouth every 8 hours as needed. 30 tablet 0   Oxycodone HCl 10 MG TABS Take 1 tablet (10 mg) by mouth every 4 hours as needed. 90 tablet 0   potassium chloride SA (KLOR-CON M) 20 MEQ tablet Take 1 tablet (20 mEq total) by mouth daily. 30 tablet 1   sacubitril-valsartan (ENTRESTO) 24-26 MG Take 1 tablet by mouth 2 (two) times daily. 180 tablet 3   No current facility-administered medications for this visit.    VITAL SIGNS: There were no vitals taken for this visit. There were no vitals filed for this visit.     Estimated body mass index is 20.83 kg/m as calculated from the following:   Height as of 05/19/23: 5\' 8"  (1.727 m).   Weight as of 05/19/23: 137 lb (62.1 kg).   PERFORMANCE STATUS (ECOG) : 1 - Symptomatic but completely ambulatory  Assessment NAD RRR Normal breathing pattern   AAO X4  IMPRESSION: Mr. Capobianco presents to clinic today for follow-up. No acute distress. Continues to remain as active as possible. Denies nausea, vomiting, constipation, or diarrhea.   Neoplasm related pain Ryon reports overall pain is well controlled. Some days are better than others.   We discussed regimen:Methadone 10mg  three times daily. Oxycodone 10mg  as needed for breakthrough pain. Taking as prescribed. Reill request within appropriate timing.   We will continue close monitoring and adjust as needed.  No changes to current regimen at this time.  Constipation Controlled with diet and daily MiraLAX.  Appetite/decreased appetite.  Much improved. Weight  132lbs up from 130lbs on 6/12, 129lbs on 5/17.  4. Anxiety/Anxiousness  Wife has expressed concerns regarding some component of depression. Mr. Kries shares that he may have some depression however feels that working outside in the warm  temperatures and just overall fatigue from his cancer is playing a large roll in how he feels.   He was previously referred to social work and Orthoptist for spiritual support in the past however was never able to connect due to missed visits or not feeling well. Encouraged to reconnect.   We also dicussed previous use of Lexapro however he declines to restart.   Anxiety controlled with Valium.   5. Goals of care   7/10: I created space and opportunity for Mr. Sedivy to share his thoughts and feelings regarding his current health and condition. He is realistic speaking to his understanding of Christian faith. He is taking things one day at a time preparing himself for when his time comes. Until then he wishes to continue treating the treatable allowing himself every opportunity to thrive. He knows at anytime he can discuss with his medical team his wishes and focus on his comfort.   (5/16) Mr. Colavito shares his realistic understanding of his condition. He and his wife are making sure all "affairs"  are in order as his biggest worry is leaving his wife in a financial constraint. Reports communicating with his life insurance policy etc. Emotional support provided.    He is emotional expressing racing thoughts during idol time or interruptions in his sleep as his mind wonders thinking about worst case scenario. Is requesting something to assist with sleeping and anxiety. Acknowledged his request. Education provided on limited use in collaboration with other medications including zyprexa. He verbalized understanding and aware  we will continue to support and monitor for needs.      PLAN: Methadone 10 mg every 8 hours. Close monitoring. Tolerating well. Pain controlled. Oxycodone 10 mg as needed for breakthrough pain.  Miralax daily for constipation Valium 5mg  at twice daily. Zofran as needed for nausea as prescribed. Zofran dose while in office. Marinol for appetite has been on Scientist, clinical (histocompatibility and immunogenetics). Unable to get at this time.  I will plan to see patient back in 4-6 weeks in collaboration to other oncology appointments.    Patient expressed understanding and was in agreement with this plan. He also understands that He can call the clinic at any time with any questions, concerns, or complaints.    Any controlled substances utilized were prescribed in the context of palliative care. PDMP has been reviewed.    Visit consisted of counseling and education dealing with the complex and emotionally intense issues of symptom management and palliative care in the setting of serious and potentially life-threatening illness.Greater than 50%  of this time was spent counseling and coordinating care related to the above assessment and plan.  Willette Alma, AGPCNP-BC  Palliative Medicine Team/[facility]  *Please note that this is a verbal dictation therefore any spelling or grammatical errors are due to the "[employer]" system  interpretation.

## 2023-06-16 ENCOUNTER — Other Ambulatory Visit: Payer: Self-pay

## 2023-06-16 ENCOUNTER — Other Ambulatory Visit (HOSPITAL_COMMUNITY): Payer: Self-pay

## 2023-06-17 ENCOUNTER — Other Ambulatory Visit: Payer: Self-pay | Admitting: Nurse Practitioner

## 2023-06-17 ENCOUNTER — Other Ambulatory Visit (HOSPITAL_COMMUNITY): Payer: Self-pay

## 2023-06-17 DIAGNOSIS — Z515 Encounter for palliative care: Secondary | ICD-10-CM

## 2023-06-17 DIAGNOSIS — G893 Neoplasm related pain (acute) (chronic): Secondary | ICD-10-CM

## 2023-06-17 DIAGNOSIS — C880 Waldenstrom macroglobulinemia: Secondary | ICD-10-CM

## 2023-06-17 MED ORDER — OXYCODONE HCL 10 MG PO TABS
10.0000 mg | ORAL_TABLET | ORAL | 0 refills | Status: DC | PRN
Start: 2023-06-20 — End: 2023-06-28
  Filled 2023-06-20: qty 90, 15d supply, fill #0

## 2023-06-18 ENCOUNTER — Other Ambulatory Visit (HOSPITAL_COMMUNITY): Payer: Self-pay

## 2023-06-20 ENCOUNTER — Other Ambulatory Visit (HOSPITAL_COMMUNITY): Payer: Self-pay

## 2023-06-21 ENCOUNTER — Encounter: Payer: Self-pay | Admitting: Dermatology

## 2023-06-22 ENCOUNTER — Ambulatory Visit: Payer: Medicaid Other

## 2023-06-22 ENCOUNTER — Inpatient Hospital Stay (HOSPITAL_BASED_OUTPATIENT_CLINIC_OR_DEPARTMENT_OTHER): Payer: Medicaid Other | Admitting: Hematology and Oncology

## 2023-06-22 ENCOUNTER — Other Ambulatory Visit (HOSPITAL_COMMUNITY): Payer: Self-pay | Admitting: Emergency Medicine

## 2023-06-22 ENCOUNTER — Encounter: Payer: Medicaid Other | Admitting: Student

## 2023-06-22 ENCOUNTER — Inpatient Hospital Stay: Payer: Medicaid Other | Attending: Hematology and Oncology | Admitting: Nurse Practitioner

## 2023-06-22 ENCOUNTER — Inpatient Hospital Stay: Payer: Medicaid Other

## 2023-06-22 ENCOUNTER — Encounter: Payer: Self-pay | Admitting: Nurse Practitioner

## 2023-06-22 VITALS — BP 125/81 | HR 81 | Temp 97.4°F | Resp 18 | Ht 68.0 in | Wt 132.0 lb

## 2023-06-22 DIAGNOSIS — C880 Waldenstrom macroglobulinemia: Secondary | ICD-10-CM | POA: Insufficient documentation

## 2023-06-22 DIAGNOSIS — D472 Monoclonal gammopathy: Secondary | ICD-10-CM | POA: Diagnosis not present

## 2023-06-22 DIAGNOSIS — Z515 Encounter for palliative care: Secondary | ICD-10-CM | POA: Diagnosis not present

## 2023-06-22 DIAGNOSIS — M25551 Pain in right hip: Secondary | ICD-10-CM | POA: Diagnosis not present

## 2023-06-22 DIAGNOSIS — E876 Hypokalemia: Secondary | ICD-10-CM | POA: Insufficient documentation

## 2023-06-22 DIAGNOSIS — F32A Depression, unspecified: Secondary | ICD-10-CM | POA: Diagnosis not present

## 2023-06-22 DIAGNOSIS — Z885 Allergy status to narcotic agent status: Secondary | ICD-10-CM | POA: Insufficient documentation

## 2023-06-22 DIAGNOSIS — Z87891 Personal history of nicotine dependence: Secondary | ICD-10-CM | POA: Insufficient documentation

## 2023-06-22 DIAGNOSIS — I110 Hypertensive heart disease with heart failure: Secondary | ICD-10-CM | POA: Diagnosis not present

## 2023-06-22 DIAGNOSIS — R112 Nausea with vomiting, unspecified: Secondary | ICD-10-CM | POA: Diagnosis not present

## 2023-06-22 DIAGNOSIS — I5022 Chronic systolic (congestive) heart failure: Secondary | ICD-10-CM | POA: Insufficient documentation

## 2023-06-22 DIAGNOSIS — Z79620 Long term (current) use of immunosuppressive biologic: Secondary | ICD-10-CM | POA: Diagnosis not present

## 2023-06-22 DIAGNOSIS — K5900 Constipation, unspecified: Secondary | ICD-10-CM | POA: Diagnosis not present

## 2023-06-22 DIAGNOSIS — Z8249 Family history of ischemic heart disease and other diseases of the circulatory system: Secondary | ICD-10-CM | POA: Insufficient documentation

## 2023-06-22 DIAGNOSIS — Z79899 Other long term (current) drug therapy: Secondary | ICD-10-CM | POA: Insufficient documentation

## 2023-06-22 DIAGNOSIS — R530 Neoplastic (malignant) related fatigue: Secondary | ICD-10-CM

## 2023-06-22 DIAGNOSIS — M25552 Pain in left hip: Secondary | ICD-10-CM | POA: Diagnosis not present

## 2023-06-22 DIAGNOSIS — Z5986 Financial insecurity: Secondary | ICD-10-CM | POA: Diagnosis not present

## 2023-06-22 DIAGNOSIS — D509 Iron deficiency anemia, unspecified: Secondary | ICD-10-CM | POA: Insufficient documentation

## 2023-06-22 DIAGNOSIS — G893 Neoplasm related pain (acute) (chronic): Secondary | ICD-10-CM

## 2023-06-22 DIAGNOSIS — F419 Anxiety disorder, unspecified: Secondary | ICD-10-CM | POA: Diagnosis not present

## 2023-06-22 LAB — CMP (CANCER CENTER ONLY)
ALT: 11 U/L (ref 0–44)
AST: 17 U/L (ref 15–41)
Albumin: 4.3 g/dL (ref 3.5–5.0)
Alkaline Phosphatase: 61 U/L (ref 38–126)
Anion gap: 6 (ref 5–15)
BUN: 11 mg/dL (ref 8–23)
CO2: 27 mmol/L (ref 22–32)
Calcium: 9.4 mg/dL (ref 8.9–10.3)
Chloride: 106 mmol/L (ref 98–111)
Creatinine: 1.08 mg/dL (ref 0.61–1.24)
GFR, Estimated: >60 mL/min (ref >60)
Glucose, Bld: 102 mg/dL — ABNORMAL HIGH (ref 70–99)
Potassium: 4.3 mmol/L (ref 3.5–5.1)
Sodium: 139 mmol/L (ref 135–145)
Total Bilirubin: 0.9 mg/dL (ref 0.3–1.2)
Total Protein: 6.5 g/dL (ref 6.5–8.1)

## 2023-06-22 LAB — CBC WITH DIFFERENTIAL (CANCER CENTER ONLY)
Abs Immature Granulocytes: 0.01 10*3/uL (ref 0.00–0.07)
Basophils Absolute: 0 10*3/uL (ref 0.0–0.1)
Basophils Relative: 1 %
Eosinophils Absolute: 0.6 10*3/uL — ABNORMAL HIGH (ref 0.0–0.5)
Eosinophils Relative: 12 %
HCT: 39.7 % (ref 39.0–52.0)
Hemoglobin: 13.4 g/dL (ref 13.0–17.0)
Immature Granulocytes: 0 %
Lymphocytes Relative: 20 %
Lymphs Abs: 1.1 10*3/uL (ref 0.7–4.0)
MCH: 28.5 pg (ref 26.0–34.0)
MCHC: 33.8 g/dL (ref 30.0–36.0)
MCV: 84.5 fL (ref 80.0–100.0)
Monocytes Absolute: 0.5 10*3/uL (ref 0.1–1.0)
Monocytes Relative: 9 %
Neutro Abs: 3 10*3/uL (ref 1.7–7.7)
Neutrophils Relative %: 58 %
Platelet Count: 191 10*3/uL (ref 150–400)
RBC: 4.7 MIL/uL (ref 4.22–5.81)
RDW: 13.9 % (ref 11.5–15.5)
WBC Count: 5.2 10*3/uL (ref 4.0–10.5)
nRBC: 0 % (ref 0.0–0.2)

## 2023-06-22 LAB — LACTATE DEHYDROGENASE: LDH: 162 U/L (ref 98–192)

## 2023-06-22 NOTE — Progress Notes (Signed)
[facility] [phone]   [phone]  PROGRESS NOTE  Patient Care Team: [name], DO as PCP - General Pickenpack-Cousar, [name], NP as Nurse Practitioner (Nurse Practitioner)  Hematological/Oncological History # Waldenstrom's Macroglobulinemia # IgM Monoclonal Gammopathy 04/07/2021: CT A/p showed mildly enlarged retroperitoneal lymph nodes and bilateral inguinal lymph nodes.  04/08/2021: IgM Kappa M protein 1.2 04/09/2021: WBC 11.1, Hgb 9.5, MCV 75.4, Plt 525 04/20/2021: Kappa 977, Lambda 7.8, K/L ratio 125.28. Serum viscosity 1.8 05/06/2021: Establish care with Dr. 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 Infusion reaction with ritux, held halfway through.  06/12/2021: Cycle 2 Day 1 of Ibrutinib + Rituximab 06/19/2021: Cycle 3 Day 1 of Ibrutinib + Rituximab 06/26/2021: Cycle 4 Day 1 of rituximab. Continued on daily ibrutinib 12/02/2021: Cycle 5 Day 1 of Ibrutinib + Rituximab 12/09/2021: Cycle 6 Day 1 of Ibrutinib + Rituximab 12/16/2021: Cycle 7 Day 1 of Ibrutinib + Rituximab 12/23/2021: Held Cycle 8 of Ritxumab due to nausea/vomiting, elevated creatinine, finger infection  Interval History:  Connor Solomon 62 y.o. male with medical history significant for Waldenstrom's macroglobulinemia who presents for a follow up visit. The patient's last visit was on 05/18/23. In the interim since the last visit he has continued on Ibrutinib therapy.   On exam today Connor Solomon reports a lesion on his head got progressively worse but fortunately was biopsied evaluated by his dermatologist.  It was found not to be cancer with a biopsy on 06/02/2023.  He notes it is no longer causing pain or draining at this time and is improving.  He notes he does have a lot of pain in his joints bones,  and hips.  He reports that it did flare at 1 point but is gone back down to baseline.  He continues to follow with [facility] and palliative care.  He notes that he is tolerating his chemo well and does continue to have some nausea but is under good control at this time.  He started eating snack pack puddings when he gets nauseous and he feels like that helps with his symptoms.  His nausea and vomiting is under good control at this time overall he is willing and able to proceed with ibrutinib therapy. He is tolerating ibrutinib therapy at this time.  He denies fevers, chills, night sweats, shortness of breath, chest pain or cough. He has no other complaints. Full 10 point ROS is listed below.   MEDICAL HISTORY:  Past Medical History:  Diagnosis Date   Acute heart failure (HCC) 04/08/2021   Acute HFrEF (heart failure with reduced ejection fraction) (HCC) 04/07/2021   Arthritis    hands, elbows bilaterally   Cancer (HCC)    Full dentures    Hypertension    Pt states he doen not have HTN and has never been treated for HTN   Kidney stone on left side last stone 4-5 yrs ago   recurrent (3 episodes)   Panic disorder    pt states has resolved   Syncope    resolved- was related to panic attacks    SURGICAL HISTORY: Past Surgical History:  Procedure Laterality Date   CARPAL METACARPAL FUSION WITH DISTAL RADIAL BONE GRAFT Left 05/30/2013   Procedure: LEFT THUMB METACARPAL JOINT FUSION;  Surgeon: [name], MD;  Location: [facility];  Service: Orthopedics;  Laterality: Left;  ESOPHAGOGASTRODUODENOSCOPY N/A 02/12/2014   Procedure: ESOPHAGOGASTRODUODENOSCOPY (EGD);  Surgeon: [name], MD;  Location: [facility] ENDOSCOPY;  Service: Endoscopy;  Laterality: N/A;   FOREIGN BODY REMOVAL N/A 02/12/2014   Procedure: FOREIGN BODY REMOVAL;  Surgeon: [name], MD;  Location: [facility] ENDOSCOPY;  Service: Endoscopy;  Laterality: N/A;   OPEN REDUCTION INTERNAL FIXATION (ORIF) DISTAL RADIAL FRACTURE  Left 11/29/2012   Procedure: OPEN REDUCTION INTERNAL FIXATION (ORIF) DISTAL RADIAL FRACTURE;  Surgeon: [name], MD;  Location: [facility];  Service: Orthopedics;  Laterality: Left;  LEFT DISTAL RADIUS OSTEOTOMY WITH BONE GRAFT   RIGHT/LEFT HEART CATH AND CORONARY ANGIOGRAPHY N/A 04/09/2021   Procedure: RIGHT/LEFT HEART CATH AND CORONARY ANGIOGRAPHY;  Surgeon: [name], MD;  Location: [facility] INVASIVE CV [facility];  Service: Cardiovascular;  Laterality: N/A;   TENDON REPAIR Left 02/07/2013   Procedure: LEFT EXTENSOR INDICIS PROPRIUS  TO EXTENSOR POLLICIS LONGUS TENDON TRANSFER;  Surgeon: [name], MD;  Location: [facility];  Service: Orthopedics;  Laterality: Left;   WISDOM TOOTH EXTRACTION     WRIST SURGERY     fx only    SOCIAL HISTORY: Social History   Socioeconomic History   Marital status: Single    Spouse name: Not on file   Number of children: Not on file   Years of education: Not on file   Highest education level: Not on file  Occupational History   Not on file  Tobacco Use   Smoking status: Former    Types: Cigars    Quit date: 10/11/2020    Years since quitting: 2.6   Smokeless tobacco: Never   Tobacco comments:    smokes 1 cigar a week  Vaping Use   Vaping status: Never Used  Substance and Sexual Activity   Alcohol use: No   Drug use: No   Sexual activity: Not on file  Other Topics Concern   Not on file  Social History Narrative   Not on file   Social Determinants of Health   Financial Resource Strain: High Risk (07/17/2021)   Overall Financial Resource Strain (CARDIA)    Difficulty of Paying Living Expenses: Very hard  Food Insecurity: Food Insecurity Present (04/10/2021)   Hunger Vital Sign    Worried About Running Out of Food in the Last Year: Sometimes true    Ran Out of Food in the Last Year: Sometimes true  Transportation Needs: No Transportation Needs (04/10/2021)   PRAPARE - Scientist, research (physical sciences) (Medical): No    Lack of Transportation (Non-Medical): No  Physical Activity: Not on file  Stress: Stress Concern Present (09/01/2021)   [employer] of Occupational Health - Occupational Stress Questionnaire    Feeling of Stress : Very much  Social Connections: Not on file  Intimate Partner Violence: Not on file    FAMILY HISTORY: Family History  Problem Relation Age of Onset   Heart attack Father    Sudden Cardiac Death Father 11   Diabetes Neg Hx    Hyperlipidemia Neg Hx    Hypertension Neg Hx     ALLERGIES:  is allergic to rituxan [rituximab] and hydrocodone.  MEDICATIONS:  Current Outpatient Medications  Medication Sig Dispense Refill   acetaminophen (TYLENOL) 500 MG tablet Take 500 mg by mouth every 6 (six) hours as needed. (Patient not taking: Reported on 01/10/2023)     albuterol (PROAIR HFA) 108 (90 Base) MCG/ACT inhaler Inhale 2 puffs into the lungs every 6 (six)  hours as needed for wheezing or shortness of breath. 6.7 g 2   carvedilol (COREG) 3.125 MG tablet Take 1 tablet by mouth 2 times daily. 60 tablet 6   chlorhexidine (HIBICLENS) 4 % external liquid Apply topically daily as needed. Dilute with water and clean wound with this.  Rinse thoroughly afterwards 237 mL 0   clobetasol cream (TEMOVATE) 0.05 % Apply 1 Application topically 2 (two) times daily. Apply topically to the arms and hands twice daily for up to two weeks 30 g 2   dapagliflozin propanediol (FARXIGA) 10 MG TABS tablet Take 1 tablet (10 mg total) by mouth daily before breakfast. 30 tablet 11   diazepam (VALIUM) 5 MG tablet Take 1 tablet (5 mg total) by mouth every 12 (twelve) hours as needed for anxiety. 60 tablet 0   dronabinol (MARINOL) 5 MG capsule Take 1 capsule (5 mg) by mouth 2 times daily before a meal. 60 capsule 0   eplerenone (INSPRA) 25 MG tablet Take 0.5 tablets (12.5 mg total) by mouth daily. 15 tablet 3   ferrous sulfate (FEROSUL) 325 (65 FE) MG tablet Take 1 tablet by mouth  daily with breakfast. Please take with a source of Vitamin C 90 tablet 3   ibrutinib (IMBRUVICA) 420 MG tablet Take 1 tablet (420 mg total) by mouth daily. 28 tablet 2   methadone (DOLOPHINE) 10 MG tablet Take 1 tablet (10 mg) by mouth every 8 hours. 90 tablet 0   naloxone (NARCAN) nasal spray 4 mg/0.1 mL Take for overdose 1 each 0   naloxone (NARCAN) nasal spray 4 mg/0.1 mL Take for overdose as directed 2 each 0   ondansetron (ZOFRAN) 8 MG tablet Take 1 tablet (8 mg) by mouth every 8 hours as needed. 30 tablet 0   Oxycodone HCl 10 MG TABS Take 1 tablet (10 mg) by mouth every 4 hours as needed. 90 tablet 0   potassium chloride SA (KLOR-CON M) 20 MEQ tablet Take 1 tablet (20 mEq total) by mouth daily. 30 tablet 1   sacubitril-valsartan (ENTRESTO) 24-26 MG Take 1 tablet by mouth 2 (two) times daily. 180 tablet 3   No current facility-administered medications for this visit.    REVIEW OF SYSTEMS:   Constitutional: ( - ) fevers, ( - )  chills , ( - ) night sweats Eyes: ( - ) blurriness of vision, ( - ) double vision, ( - ) watery eyes Ears, nose, mouth, throat, and face: ( - ) mucositis, ( - ) sore throat Respiratory: ( - ) cough, ( - ) dyspnea, ( - ) wheezes Cardiovascular: ( - ) palpitation, ( - ) chest discomfort, ( - ) lower extremity swelling Gastrointestinal:  (- ) nausea, ( - ) heartburn, ( - ) change in bowel habits Skin: ( - ) abnormal skin rashes Lymphatics: ( - ) new lymphadenopathy, ( - ) easy bruising Neurological: ( - ) numbness, ( - ) tingling, ( - ) new weaknesses Behavioral/Psych: ( - ) mood change, ( - ) new changes  All other systems were reviewed with the patient and are negative.  PHYSICAL EXAMINATION: ECOG PERFORMANCE STATUS: 2 - Symptomatic, <50% confined to bed  There were no vitals filed for this visit.  There were no vitals filed for this visit.   GENERAL: Chronically ill appearing middle-aged Caucasian male, alert, no distress and comfortable SKIN: Dry  cracking rash of hands bilaterally.  No issues on the chest, face, or lower extremities.  Skin color, texture, turgor are  normal, no rashes or significant lesions.  EYES: conjunctiva are pink and non-injected, sclera clear LUNGS: clear to auscultation and percussion with normal breathing effort HEART: regular rate & rhythm and no murmurs and no lower extremity edema  MSK: Bilateral edema of hands.  PSYCH: alert & oriented x 3, fluent speech NEURO: no focal motor/sensory deficits   LABORATORY DATA:  I have reviewed the data as listed    Latest Ref Rng & Units 06/22/2023   10:06 AM 05/18/2023   10:59 AM 04/20/2023   10:26 AM  CBC  WBC 4.0 - 10.5 K/uL 5.2  7.3  5.5   Hemoglobin 13.0 - 17.0 g/dL 16.1  09.6  04.5   Hematocrit 39.0 - 52.0 % 39.7  33.5  38.5   Platelets 150 - 400 K/uL 191  157  233        Latest Ref Rng & Units 06/22/2023   10:06 AM 05/18/2023   10:59 AM 04/20/2023   10:26 AM  CMP  Glucose 70 - 99 mg/dL 409  84  811   BUN 8 - 23 mg/dL 11  12  15    Creatinine 0.61 - 1.24 mg/dL 9.14  7.82  9.56   Sodium 135 - 145 mmol/L 139  137  138   Potassium 3.5 - 5.1 mmol/L 4.3  3.9  3.8   Chloride 98 - 111 mmol/L 106  103  103   CO2 22 - 32 mmol/L 27  30  29    Calcium 8.9 - 10.3 mg/dL 9.4  8.5  9.1   Total Protein 6.5 - 8.1 g/dL 6.5  6.0  6.4   Total Bilirubin 0.3 - 1.2 mg/dL 0.9  0.5  0.6   Alkaline Phos 38 - 126 U/L 61  63  68   AST 15 - 41 U/L 17  12  16    ALT 0 - 44 U/L 11  6  7      Lab Results  Component Value Date   MPROTEIN 0.1 (H) 05/18/2023   MPROTEIN 0.1 (H) 04/20/2023   MPROTEIN 0.1 (H) 03/23/2023   Lab Results  Component Value Date   KPAFRELGTCHN 20.3 (H) 05/18/2023   KPAFRELGTCHN 28.0 (H) 04/20/2023   KPAFRELGTCHN 27.0 (H) 03/23/2023   LAMBDASER 5.8 05/18/2023   LAMBDASER 5.2 (L) 04/20/2023   LAMBDASER 6.7 03/23/2023   KAPLAMBRATIO 3.50 (H) 05/18/2023   KAPLAMBRATIO 5.38 (H) 04/20/2023   KAPLAMBRATIO 4.03 (H) 03/23/2023    RADIOGRAPHIC STUDIES: No  results found.  ASSESSMENT & PLAN Connor Solomon 62 y.o. male with medical history significant for Waldenstrom's macroglobulinemia who presents for a follow up visit.   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  There is no clear evidence of hyperviscosity syndrome at this time.  At this time we will plan to proceed with ibrutinib and rituximab therapy.  One could also consider Bendamustine and rituximab therapy but given the patient's degree of anemia I would prefer pursuing ibrutinib and rituximab at this time.  Additionally ibrutinib/rituximab are category 1 recommendations per the NCCN guidelines.  The treatment will consist of ibrutinib 420 mg p.o. daily with rituximab on weeks 1 through 4 as well as weeks 27 through 30.  Previously we discussed the risks and benefits of therapy including (but not limited to) risk of bleeding, fatigue, atypical infections, and cardiac arrhythmias.  The patient voices understanding of this plan moving forward.   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 --Started second round of weekly rituximab on 12/02/2021. Continued weekly x 4 weeks.   --last rituximab dose held due to missed visits/illness.  Plan: --Labs today were reviewed without any intervention needed.  Labs show white cell count 5.2, hemoglobin 13.4, MCV 84.5, and platelets of 191.  Pending SPEP and sFLC levels but prior levels appear stable. --Currently on ibrutinib 420 mg PO daily.  --RTC in 4 weeks with labs   # Lesion on Back Of Head- resolved  --  Patient has a ping-pong ball sized lesion on the back of his head.  It appeared to be draining and has some dried blood and crusting. -- Patient evaluated by Dr. Onalee Hua in dermatology.  Lesion was biopsied and found not to be cancerous. -- Appears to be healing well and improving greatly.  Continue per Dr. Kermit Balo recommendations.  # Rash-improved markedly -- Affecting hands bilaterally, upper extremities bilaterally, and back.  Not affecting face, chest, or lower extremities. -- Patient has dryness and cracking as well as erythema on the upper extremities. -- Etiology is unclear.  Patient is using hydrating creams but is not effective.  Made referral to dermatology for evaluation, he is established with Dr. Langston Reusing.  # Hypokalemia -- Potassium 4.3 continue to monitor   #Pain Management --Pain was poorly controlled on hydrocodone therapy previously.  --Patient was previously admitted for for opioid overdose with presumed fentanyl --Currently pain regimen includes methadone 10 mg TID and oxycodone 10 mg PRN for breakthrough pain per palliative care.  -- Appreciate recommendations of palliative care.  #Normocytic Anemia  --Hgb 13.4 --previously there was a component of iron deficiency anemia as well.  Currently on PO ferrous sulfate. --Continue to monitor.  #Nausea and vomiting--improved -- Concern that the nausea and vomiting may not be related to the ibrutinib therapy and potentially tied to poor gastric motility due to opioid prescription.  --Unable to tolerate olanzopine so discontinued --Not taking metoclopramide 10 mg every 8 hours  --continue zofran PRN.   #Fatigue #Anxiety/Depression: --Fatigue and depression have worsened recently --Anxiety has improved with Valium  --Will request further evaluation by palliative care team, scheduled for next visit today  #Supportive Care -- chemotherapy education complete -- port placement not required   Orders Placed This  Encounter  Procedures   CBC with Differential (Cancer Center Only)    Standing Status:   Future    Number of Occurrences:   1    Standing Expiration Date:   06/21/2024   CMP (Cancer Center only)    Standing Status:   Future    Number of Occurrences:   1    Standing Expiration Date:   06/21/2024   Lactate dehydrogenase (LDH)    Standing Status:   Future    Number of Occurrences:   1    Standing Expiration Date:   06/21/2024   Multiple Myeloma Panel (SPEP&IFE w/QIG)    Standing Status:   Future    Number of Occurrences:   1    Standing Expiration Date:   06/21/2024   Kappa/lambda light chains    Standing Status:   Future    Number of Occurrences:   1    Standing Expiration Date:   06/21/2024  All questions were answered. The patient knows to call the clinic with any problems, questions or concerns.  I have spent a total of 30 minutes minutes of face-to-face and non-face-to-face time, preparing to see the patient,  performing a medically appropriate examination, counseling and educating the patient, ordering medications/tests, referring and communicating with other health care professionals, documenting clinical information in the electronic health record, and care coordination.   Ulysees Barns, MD Department of Hematology/Oncology [facility] Phone: [phone] Pager: [phone] Email: [email or web address]  06/22/2023 2:38 PM  Dimopoulos MA, Lennette Bihari, T[place]man Joycelyn Rua, Mahe B, Herbaux C, Tam C, Orsucci L, [place] ML, Matous JV, Rison C, Kastritis E, [place], Li J, Salman Z, Graef T, Buske C; iNNOVATE Study Group and the [employer]. Phase 3 Trial of Ibrutinib plus Rituximab in Waldenstrm's Macroglobulinemia. [place] Mis J Med. 2018 Jun 21;378(25):2399-2410.   --At 30 months, the progression-free survival rate was 82% with ibrutinib-rituximab versus 28% with  placebo-rituximab (hazard ratio for progression or death, 0.20; P<0.001).

## 2023-06-23 ENCOUNTER — Inpatient Hospital Stay: Payer: Medicaid Other | Admitting: Hematology and Oncology

## 2023-06-23 ENCOUNTER — Inpatient Hospital Stay: Payer: Medicaid Other

## 2023-06-23 LAB — KAPPA/LAMBDA LIGHT CHAINS
Kappa free light chain: 26 mg/L — ABNORMAL HIGH (ref 3.3–19.4)
Kappa, lambda light chain ratio: 3.88 — ABNORMAL HIGH (ref 0.26–1.65)
Lambda free light chains: 6.7 mg/L (ref 5.7–26.3)

## 2023-06-25 ENCOUNTER — Other Ambulatory Visit (HOSPITAL_COMMUNITY): Payer: Self-pay

## 2023-06-26 ENCOUNTER — Other Ambulatory Visit: Payer: Self-pay | Admitting: Nurse Practitioner

## 2023-06-26 DIAGNOSIS — Z515 Encounter for palliative care: Secondary | ICD-10-CM

## 2023-06-26 DIAGNOSIS — G893 Neoplasm related pain (acute) (chronic): Secondary | ICD-10-CM

## 2023-06-26 DIAGNOSIS — C880 Waldenstrom macroglobulinemia: Secondary | ICD-10-CM

## 2023-06-26 LAB — MULTIPLE MYELOMA PANEL, SERUM
Albumin SerPl Elph-Mcnc: 3.9 g/dL (ref 2.9–4.4)
Albumin/Glob SerPl: 1.8 — ABNORMAL HIGH (ref 0.7–1.7)
Alpha 1: 0.3 g/dL (ref 0.0–0.4)
Alpha2 Glob SerPl Elph-Mcnc: 0.7 g/dL (ref 0.4–1.0)
B-Globulin SerPl Elph-Mcnc: 0.8 g/dL (ref 0.7–1.3)
Gamma Glob SerPl Elph-Mcnc: 0.5 g/dL (ref 0.4–1.8)
Globulin, Total: 2.2 g/dL (ref 2.2–3.9)
IgA: 29 mg/dL — ABNORMAL LOW (ref 61–437)
IgG (Immunoglobin G), Serum: 498 mg/dL — ABNORMAL LOW (ref 603–1613)
IgM (Immunoglobulin M), Srm: 114 mg/dL (ref 20–172)
M Protein SerPl Elph-Mcnc: 0.1 g/dL — ABNORMAL HIGH
Total Protein ELP: 6.1 g/dL (ref 6.0–8.5)

## 2023-06-27 ENCOUNTER — Other Ambulatory Visit: Payer: Self-pay

## 2023-06-27 ENCOUNTER — Other Ambulatory Visit (HOSPITAL_COMMUNITY): Payer: Self-pay

## 2023-06-27 MED ORDER — METHADONE HCL 10 MG PO TABS
10.0000 mg | ORAL_TABLET | Freq: Three times a day (TID) | ORAL | 0 refills | Status: DC
Start: 2023-06-29 — End: 2023-07-25
  Filled 2023-06-29: qty 90, 30d supply, fill #0

## 2023-06-28 ENCOUNTER — Other Ambulatory Visit: Payer: Self-pay | Admitting: Nurse Practitioner

## 2023-06-28 DIAGNOSIS — C880 Waldenstrom macroglobulinemia: Secondary | ICD-10-CM

## 2023-06-28 DIAGNOSIS — G893 Neoplasm related pain (acute) (chronic): Secondary | ICD-10-CM

## 2023-06-28 DIAGNOSIS — Z515 Encounter for palliative care: Secondary | ICD-10-CM

## 2023-06-29 ENCOUNTER — Other Ambulatory Visit (HOSPITAL_COMMUNITY): Payer: Self-pay

## 2023-06-29 ENCOUNTER — Other Ambulatory Visit: Payer: Self-pay

## 2023-06-29 MED ORDER — OXYCODONE HCL 10 MG PO TABS
10.0000 mg | ORAL_TABLET | ORAL | 0 refills | Status: AC | PRN
Start: 2023-07-02 — End: ?
  Filled 2023-07-02: qty 18, 3d supply, fill #0
  Filled 2023-07-02: qty 72, 12d supply, fill #0
  Filled 2023-07-02: qty 90, 15d supply, fill #0

## 2023-06-30 ENCOUNTER — Other Ambulatory Visit: Payer: Self-pay

## 2023-06-30 ENCOUNTER — Other Ambulatory Visit (HOSPITAL_COMMUNITY): Payer: Self-pay

## 2023-07-01 ENCOUNTER — Other Ambulatory Visit: Payer: Self-pay

## 2023-07-02 ENCOUNTER — Other Ambulatory Visit (HOSPITAL_COMMUNITY): Payer: Self-pay

## 2023-07-04 ENCOUNTER — Other Ambulatory Visit: Payer: Self-pay

## 2023-07-05 ENCOUNTER — Other Ambulatory Visit (HOSPITAL_COMMUNITY): Payer: Self-pay

## 2023-07-06 ENCOUNTER — Other Ambulatory Visit (HOSPITAL_COMMUNITY): Payer: Self-pay

## 2023-07-11 ENCOUNTER — Other Ambulatory Visit (HOSPITAL_COMMUNITY): Payer: Self-pay | Admitting: Family Medicine

## 2023-07-11 ENCOUNTER — Other Ambulatory Visit: Payer: Self-pay | Admitting: Nurse Practitioner

## 2023-07-11 DIAGNOSIS — G4700 Insomnia, unspecified: Secondary | ICD-10-CM

## 2023-07-11 DIAGNOSIS — Z515 Encounter for palliative care: Secondary | ICD-10-CM

## 2023-07-11 DIAGNOSIS — G893 Neoplasm related pain (acute) (chronic): Secondary | ICD-10-CM

## 2023-07-11 DIAGNOSIS — C8800 Waldenstrom macroglobulinemia not having achieved remission: Secondary | ICD-10-CM

## 2023-07-12 ENCOUNTER — Other Ambulatory Visit (HOSPITAL_COMMUNITY): Payer: Self-pay

## 2023-07-12 DIAGNOSIS — Z419 Encounter for procedure for purposes other than remedying health state, unspecified: Secondary | ICD-10-CM | POA: Diagnosis not present

## 2023-07-12 MED ORDER — DIAZEPAM 5 MG PO TABS
5.0000 mg | ORAL_TABLET | Freq: Two times a day (BID) | ORAL | 0 refills | Status: DC | PRN
Start: 2023-07-13 — End: 2023-08-12
  Filled 2023-07-12: qty 60, 30d supply, fill #0

## 2023-07-12 MED ORDER — OXYCODONE HCL 10 MG PO TABS
10.0000 mg | ORAL_TABLET | ORAL | 0 refills | Status: DC | PRN
Start: 2023-07-13 — End: 2023-07-25
  Filled 2023-07-13: qty 16, 3d supply, fill #0
  Filled 2023-07-13: qty 74, 12d supply, fill #0

## 2023-07-12 MED ORDER — EPLERENONE 25 MG PO TABS
12.5000 mg | ORAL_TABLET | Freq: Every day | ORAL | 3 refills | Status: DC
  Filled 2023-07-12: qty 15, 30d supply, fill #0
  Filled 2023-10-06: qty 15, 30d supply, fill #1

## 2023-07-13 ENCOUNTER — Other Ambulatory Visit (HOSPITAL_COMMUNITY): Payer: Self-pay

## 2023-07-13 ENCOUNTER — Inpatient Hospital Stay: Payer: Medicaid Other

## 2023-07-13 ENCOUNTER — Inpatient Hospital Stay: Payer: Medicaid Other | Admitting: Hematology and Oncology

## 2023-07-15 NOTE — Progress Notes (Deleted)
Palliative Medicine [facility]  [phone]) [phone] Fax:([phone]) [phone]   Name: Connor Solomon Date: 07/15/2023 MRN: [id number]  DOB: 1960/11/23  Patient Care Team: [name], DO as PCP - General Pickenpack-Cousar, [name], NP as Nurse Practitioner (Nurse Practitioner)   REASON FOR CONSULTATION: Connor Solomon is a 62 y.o. 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 Palliative ask to see for symptom management.  SOCIAL HISTORY:     reports that he quit smoking about 2 years ago. His smoking use included cigars. He has never used smokeless tobacco. He reports that he does not drink alcohol and does not use drugs.  ADVANCE DIRECTIVES:    CODE STATUS:   PAST MEDICAL HISTORY: Past Medical History:  Diagnosis Date   Acute heart failure (HCC) 04/08/2021   Acute HFrEF (heart failure with reduced ejection fraction) (HCC) 04/07/2021   Arthritis    hands, elbows bilaterally   Cancer (HCC)    Full dentures    Hypertension    Pt states he doen not have HTN and has never been treated for HTN   Kidney stone on left side last stone 4-5 yrs ago   recurrent (3 episodes)   Panic disorder    pt states has resolved   Syncope    resolved- was related to panic attacks    PAST SURGICAL HISTORY:  Past Surgical History:  Procedure Laterality Date   CARPAL METACARPAL FUSION WITH DISTAL RADIAL BONE GRAFT Left 05/30/2013   Procedure: LEFT THUMB METACARPAL JOINT FUSION;  Surgeon: [name], MD;  Location: [facility];  Service: Orthopedics;  Laterality: Left;   ESOPHAGOGASTRODUODENOSCOPY N/A 02/12/2014   Procedure: ESOPHAGOGASTRODUODENOSCOPY (EGD);  Surgeon: [name], MD;  Location: [facility] ENDOSCOPY;  Service: Endoscopy;  Laterality: N/A;   FOREIGN BODY REMOVAL N/A 02/12/2014   Procedure: FOREIGN BODY REMOVAL;  Surgeon: [name], MD;  Location: [facility]  ENDOSCOPY;  Service: Endoscopy;  Laterality: N/A;   OPEN REDUCTION INTERNAL FIXATION (ORIF) DISTAL RADIAL FRACTURE Left 11/29/2012   Procedure: OPEN REDUCTION INTERNAL FIXATION (ORIF) DISTAL RADIAL FRACTURE;  Surgeon: [name], MD;  Location: [facility];  Service: Orthopedics;  Laterality: Left;  LEFT DISTAL RADIUS OSTEOTOMY WITH BONE GRAFT   RIGHT/LEFT HEART CATH AND CORONARY ANGIOGRAPHY N/A 04/09/2021   Procedure: RIGHT/LEFT HEART CATH AND CORONARY ANGIOGRAPHY;  Surgeon: [name], MD;  Location: [facility];  Service: Cardiovascular;  Laterality: N/A;   TENDON REPAIR Left 02/07/2013   Procedure: LEFT EXTENSOR INDICIS PROPRIUS  TO EXTENSOR POLLICIS LONGUS TENDON TRANSFER;  Surgeon: [name], MD;  Location:  SURGERY CENTER;  Service: Orthopedics;  Laterality: Left;   WISDOM TOOTH EXTRACTION     WRIST SURGERY     fx only    HEMATOLOGY/ONCOLOGY HISTORY:  Oncology History  Waldenstrom macroglobulinemia  05/24/2021 Initial Diagnosis   Waldenstrom macroglobulinemia (HCC)   06/05/2021 - 12/30/2021 Chemotherapy   Patient is on Treatment Plan : NON-HODGKINS LYMPHOMA Rituximab q7d       ALLERGIES:  is allergic to rituxan [rituximab] and hydrocodone.  MEDICATIONS:  Current Outpatient Medications  Medication Sig Dispense Refill   acetaminophen (TYLENOL) 500 MG tablet Take 500 mg by mouth every 6 (six) hours as needed. (Patient not taking: Reported on 01/10/2023)     albuterol (PROAIR HFA) 108 (90 Base) MCG/ACT inhaler Inhale 2 puffs into the lungs every 6 (six) hours as  needed for wheezing or shortness of breath. 6.7 g 2   carvedilol (COREG) 3.125 MG tablet Take 1 tablet by mouth 2 times daily. 60 tablet 6   chlorhexidine (HIBICLENS) 4 % external liquid Apply topically daily as needed. Dilute with water and clean wound with this.  Rinse thoroughly afterwards 237 mL 0   clobetasol cream (TEMOVATE) 0.05 % Apply 1 Application topically 2 (two)  times daily. Apply topically to the arms and hands twice daily for up to two weeks 30 g 2   dapagliflozin propanediol (FARXIGA) 10 MG TABS tablet Take 1 tablet (10 mg total) by mouth daily before breakfast. 30 tablet 11   diazepam (VALIUM) 5 MG tablet Take 1 tablet (5 mg total) by mouth every 12 (twelve) hours as needed for anxiety. 60 tablet 0   dronabinol (MARINOL) 5 MG capsule Take 1 capsule (5 mg) by mouth 2 times daily before a meal. 60 capsule 0   eplerenone (INSPRA) 25 MG tablet Take 0.5 tablets (12.5 mg total) by mouth daily. NEEDS FOLLOW UP APPOINTMENT FOR MORE REFILLS 15 tablet 3   ferrous sulfate (FEROSUL) 325 (65 FE) MG tablet Take 1 tablet by mouth daily with breakfast. Please take with a source of Vitamin C 90 tablet 3   ibrutinib (IMBRUVICA) 420 MG tablet Take 1 tablet (420 mg total) by mouth daily. 28 tablet 2   methadone (DOLOPHINE) 10 MG tablet Take 1 tablet (10 mg total) by mouth every 8 (eight) hours. 90 tablet 0   naloxone (NARCAN) nasal spray 4 mg/0.1 mL Take for overdose 1 each 0   naloxone (NARCAN) nasal spray 4 mg/0.1 mL Take for overdose as directed 2 each 0   ondansetron (ZOFRAN) 8 MG tablet Take 1 tablet (8 mg) by mouth every 8 hours as needed. 30 tablet 0   Oxycodone HCl 10 MG TABS Take 1 tablet (10 mg total) by mouth every 4 (four) hours as needed. 90 tablet 0   potassium chloride SA (KLOR-CON M) 20 MEQ tablet Take 1 tablet (20 mEq total) by mouth daily. 30 tablet 1   sacubitril-valsartan (ENTRESTO) 24-26 MG Take 1 tablet by mouth 2 (two) times daily. 180 tablet 3   No current facility-administered medications for this visit.    VITAL SIGNS: There were no vitals taken for this visit. There were no vitals filed for this visit.     Estimated body mass index is 20.07 kg/m as calculated from the following:   Height as of 06/22/23: 5\' 8"  (1.727 m).   Weight as of 06/22/23: 132 lb (59.9 kg).   PERFORMANCE STATUS (ECOG) : 1 - Symptomatic but completely  ambulatory  Assessment NAD RRR Normal breathing pattern  AAO X4  IMPRESSION:   Neoplasm related pain Demonte reports overall pain is well controlled. Some days are better than others.   We discussed regimen:Methadone 10mg  three times daily. Oxycodone 10mg  as needed for breakthrough pain. Taking as prescribed. Reill request within appropriate timing.   We will continue close monitoring and adjust as needed.  No changes to current regimen at this time.  Constipation Controlled with diet and daily MiraLAX.  Appetite/decreased appetite.  Much improved. Weight  132lbs up from 130lbs on 6/12, 129lbs on 5/17.  4. Anxiety/Anxiousness   5. Goals of care   7/10: I created space and opportunity for Mr. Byram to share his thoughts and feelings regarding his current health and condition. He is realistic speaking to his understanding of Christian faith. He is taking things one  day at a time preparing himself for when his time comes. Until then he wishes to continue treating the treatable allowing himself every opportunity to thrive. He knows at anytime he can discuss with his medical team his wishes and focus on his comfort.   (5/16) Mr. Gieser shares his realistic understanding of his condition. He and his wife are making sure all "affairs" are in order as his biggest worry is leaving his wife in a financial constraint. Reports communicating with his life insurance policy etc. Emotional support provided.    He is emotional expressing racing thoughts during idol time or interruptions in his sleep as his mind wonders thinking about worst case scenario. Is requesting something to assist with sleeping and anxiety. Acknowledged his request. Education provided on limited use in collaboration with other medications including zyprexa. He verbalized understanding and aware  we will continue to support and monitor for needs.      PLAN: Methadone 10 mg every 8 hours. Close monitoring. Tolerating well.  Pain controlled. Oxycodone 10 mg as needed for breakthrough pain.  Miralax daily for constipation Valium 5mg  at twice daily. Zofran as needed for nausea as prescribed. Zofran dose while in office. Marinol for appetite has been on Scientist, clinical (histocompatibility and immunogenetics). Unable to get at this time.  I will plan to see patient back in 4-6 weeks in collaboration to other oncology appointments.    Patient expressed understanding and was in agreement with this plan. He also understands that He can call the clinic at any time with any questions, concerns, or complaints.    Any controlled substances utilized were prescribed in the context of palliative care. PDMP has been reviewed.    Visit consisted of counseling and education dealing with the complex and emotionally intense issues of symptom management and palliative care in the setting of serious and potentially life-threatening illness.Greater than 50%  of this time was spent counseling and coordinating care related to the above assessment and plan.  Willette Alma, AGPCNP-BC  Palliative Medicine Team/[facility]  *Please note that this is a verbal dictation therefore any spelling or grammatical errors are due to the "[employer] One" system interpretation.

## 2023-07-18 ENCOUNTER — Inpatient Hospital Stay (HOSPITAL_BASED_OUTPATIENT_CLINIC_OR_DEPARTMENT_OTHER): Payer: Medicaid Other | Admitting: Hematology and Oncology

## 2023-07-18 ENCOUNTER — Inpatient Hospital Stay: Payer: Medicaid Other

## 2023-07-18 ENCOUNTER — Inpatient Hospital Stay: Payer: Medicaid Other | Attending: Hematology and Oncology

## 2023-07-18 VITALS — BP 102/83 | HR 74 | Temp 97.8°F | Resp 16 | Ht 68.0 in | Wt 133.0 lb

## 2023-07-18 DIAGNOSIS — G893 Neoplasm related pain (acute) (chronic): Secondary | ICD-10-CM | POA: Diagnosis not present

## 2023-07-18 DIAGNOSIS — E876 Hypokalemia: Secondary | ICD-10-CM | POA: Insufficient documentation

## 2023-07-18 DIAGNOSIS — D472 Monoclonal gammopathy: Secondary | ICD-10-CM | POA: Insufficient documentation

## 2023-07-18 DIAGNOSIS — I5022 Chronic systolic (congestive) heart failure: Secondary | ICD-10-CM | POA: Insufficient documentation

## 2023-07-18 DIAGNOSIS — R590 Localized enlarged lymph nodes: Secondary | ICD-10-CM | POA: Diagnosis not present

## 2023-07-18 DIAGNOSIS — Z885 Allergy status to narcotic agent status: Secondary | ICD-10-CM | POA: Diagnosis not present

## 2023-07-18 DIAGNOSIS — R059 Cough, unspecified: Secondary | ICD-10-CM | POA: Diagnosis not present

## 2023-07-18 DIAGNOSIS — F32A Depression, unspecified: Secondary | ICD-10-CM | POA: Insufficient documentation

## 2023-07-18 DIAGNOSIS — Z87891 Personal history of nicotine dependence: Secondary | ICD-10-CM | POA: Insufficient documentation

## 2023-07-18 DIAGNOSIS — I110 Hypertensive heart disease with heart failure: Secondary | ICD-10-CM | POA: Insufficient documentation

## 2023-07-18 DIAGNOSIS — R21 Rash and other nonspecific skin eruption: Secondary | ICD-10-CM | POA: Diagnosis not present

## 2023-07-18 DIAGNOSIS — Z9889 Other specified postprocedural states: Secondary | ICD-10-CM | POA: Diagnosis not present

## 2023-07-18 DIAGNOSIS — Z5986 Financial insecurity: Secondary | ICD-10-CM | POA: Insufficient documentation

## 2023-07-18 DIAGNOSIS — F419 Anxiety disorder, unspecified: Secondary | ICD-10-CM | POA: Diagnosis not present

## 2023-07-18 DIAGNOSIS — R0989 Other specified symptoms and signs involving the circulatory and respiratory systems: Secondary | ICD-10-CM | POA: Diagnosis not present

## 2023-07-18 DIAGNOSIS — Z8249 Family history of ischemic heart disease and other diseases of the circulatory system: Secondary | ICD-10-CM | POA: Diagnosis not present

## 2023-07-18 DIAGNOSIS — C8800 Waldenstrom macroglobulinemia not having achieved remission: Secondary | ICD-10-CM | POA: Insufficient documentation

## 2023-07-18 DIAGNOSIS — Z79899 Other long term (current) drug therapy: Secondary | ICD-10-CM | POA: Insufficient documentation

## 2023-07-18 DIAGNOSIS — Z79620 Long term (current) use of immunosuppressive biologic: Secondary | ICD-10-CM | POA: Insufficient documentation

## 2023-07-18 DIAGNOSIS — R112 Nausea with vomiting, unspecified: Secondary | ICD-10-CM | POA: Insufficient documentation

## 2023-07-18 DIAGNOSIS — D509 Iron deficiency anemia, unspecified: Secondary | ICD-10-CM | POA: Diagnosis not present

## 2023-07-18 DIAGNOSIS — R5383 Other fatigue: Secondary | ICD-10-CM | POA: Insufficient documentation

## 2023-07-18 LAB — CMP (CANCER CENTER ONLY)
ALT: 8 U/L (ref 0–44)
AST: 13 U/L — ABNORMAL LOW (ref 15–41)
Albumin: 3.7 g/dL (ref 3.5–5.0)
Alkaline Phosphatase: 56 U/L (ref 38–126)
Anion gap: 5 (ref 5–15)
BUN: 13 mg/dL (ref 8–23)
CO2: 24 mmol/L (ref 22–32)
Calcium: 8.8 mg/dL — ABNORMAL LOW (ref 8.9–10.3)
Chloride: 109 mmol/L (ref 98–111)
Creatinine: 0.97 mg/dL (ref 0.61–1.24)
GFR, Estimated: >60 mL/min (ref >60)
Glucose, Bld: 89 mg/dL (ref 70–99)
Potassium: 4.1 mmol/L (ref 3.5–5.1)
Sodium: 138 mmol/L (ref 135–145)
Total Bilirubin: 0.5 mg/dL (ref 0.3–1.2)
Total Protein: 5.6 g/dL — ABNORMAL LOW (ref 6.5–8.1)

## 2023-07-18 LAB — CBC WITH DIFFERENTIAL (CANCER CENTER ONLY)
Abs Immature Granulocytes: 0 10*3/uL (ref 0.00–0.07)
Basophils Absolute: 0.1 10*3/uL (ref 0.0–0.1)
Basophils Relative: 2 %
Eosinophils Absolute: 0.3 10*3/uL (ref 0.0–0.5)
Eosinophils Relative: 14 %
HCT: 37 % — ABNORMAL LOW (ref 39.0–52.0)
Hemoglobin: 12.2 g/dL — ABNORMAL LOW (ref 13.0–17.0)
Immature Granulocytes: 0 %
Lymphocytes Relative: 25 %
Lymphs Abs: 0.6 10*3/uL — ABNORMAL LOW (ref 0.7–4.0)
MCH: 28.1 pg (ref 26.0–34.0)
MCHC: 33 g/dL (ref 30.0–36.0)
MCV: 85.3 fL (ref 80.0–100.0)
Monocytes Absolute: 0.3 10*3/uL (ref 0.1–1.0)
Monocytes Relative: 14 %
Neutro Abs: 1 10*3/uL — ABNORMAL LOW (ref 1.7–7.7)
Neutrophils Relative %: 45 %
Platelet Count: 197 10*3/uL (ref 150–400)
RBC: 4.34 MIL/uL (ref 4.22–5.81)
RDW: 13.6 % (ref 11.5–15.5)
WBC Count: 2.3 10*3/uL — ABNORMAL LOW (ref 4.0–10.5)
nRBC: 0 % (ref 0.0–0.2)

## 2023-07-18 LAB — LACTATE DEHYDROGENASE: LDH: 120 U/L (ref 98–192)

## 2023-07-18 NOTE — Progress Notes (Signed)
[facility] [phone]   [phone]  PROGRESS NOTE  Patient Care Team: [name], DO as PCP - General Pickenpack-Cousar, [name], NP as Nurse Practitioner (Nurse Practitioner)  Hematological/Oncological History # Waldenstrom's Macroglobulinemia # IgM Monoclonal Gammopathy 04/07/2021: CT A/p showed mildly enlarged retroperitoneal lymph nodes and bilateral inguinal lymph nodes.  04/08/2021: IgM Kappa M protein 1.2 04/09/2021: WBC 11.1, Hgb 9.5, MCV 75.4, Plt 525 04/20/2021: Kappa 977, Lambda 7.8, K/L ratio 125.28. Serum viscosity 1.8 05/06/2021: Establish care with Dr. 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 Infusion reaction with ritux, held halfway through.  06/12/2021: Cycle 2 Day 1 of Ibrutinib + Rituximab 06/19/2021: Cycle 3 Day 1 of Ibrutinib + Rituximab 06/26/2021: Cycle 4 Day 1 of rituximab. Continued on daily ibrutinib 12/02/2021: Cycle 5 Day 1 of Ibrutinib + Rituximab 12/09/2021: Cycle 6 Day 1 of Ibrutinib + Rituximab 12/16/2021: Cycle 7 Day 1 of Ibrutinib + Rituximab 12/23/2021: Held Cycle 8 of Ritxumab due to nausea/vomiting, elevated creatinine, finger infection  Interval History:  Connor Solomon 62 y.o. male with medical history significant for Waldenstrom's macroglobulinemia who presents for a follow up visit. The patient's last visit was on 06/22/23. In the interim since the last visit he has continued on Ibrutinib therapy.   On exam today Connor Solomon reports he has been well overall in the interim since her last visit.  He reports the knot on the back of his head has nearly completely resolved.  He reports only a scab his left.  He does have a cold right now with some runny nose, cough, and congestion.  He notes that it has markedly improved since it started  and he is just on the tail end of his symptoms.  He notes that his appetite has been good and he is gained 1 pound in the interim since her last visit.  He reports that he is taking his ibrutinib therapy.  He notes he has the same side effects with nausea that "comes and goes".  He reports that he does have vomiting episodes approximately twice per month.  He continues to take his iron pills without difficulty.  He is also having no issues with rash on his hands though he does bruise easily.  His nausea and vomiting are under good control at this time overall he is willing and able to proceed with ibrutinib therapy. He is tolerating ibrutinib therapy at this time.  He denies fevers, chills, night sweats, shortness of breath, chest pain or cough. He has no other complaints. Full 10 point ROS is listed below.   MEDICAL HISTORY:  Past Medical History:  Diagnosis Date   Acute heart failure (HCC) 04/08/2021   Acute HFrEF (heart failure with reduced ejection fraction) (HCC) 04/07/2021   Arthritis    hands, elbows bilaterally   Cancer (HCC)    Full dentures    Hypertension    Pt states he doen not have HTN and has never been treated for HTN   Kidney stone on left side last stone 4-5 yrs ago   recurrent (3 episodes)   Panic disorder    pt states has resolved   Syncope    resolved- was related to panic attacks    SURGICAL HISTORY: Past Surgical History:  Procedure Laterality Date   CARPAL METACARPAL FUSION WITH DISTAL RADIAL BONE GRAFT Left 05/30/2013  Procedure: LEFT THUMB METACARPAL JOINT FUSION;  Surgeon: [name], MD;  Location: [facility];  Service: Orthopedics;  Laterality: Left;   ESOPHAGOGASTRODUODENOSCOPY N/A 02/12/2014   Procedure: ESOPHAGOGASTRODUODENOSCOPY (EGD);  Surgeon: [name], MD;  Location: [facility] ENDOSCOPY;  Service: Endoscopy;  Laterality: N/A;   FOREIGN BODY REMOVAL N/A 02/12/2014   Procedure: FOREIGN BODY REMOVAL;  Surgeon: [name], MD;  Location:  [facility] ENDOSCOPY;  Service: Endoscopy;  Laterality: N/A;   OPEN REDUCTION INTERNAL FIXATION (ORIF) DISTAL RADIAL FRACTURE Left 11/29/2012   Procedure: OPEN REDUCTION INTERNAL FIXATION (ORIF) DISTAL RADIAL FRACTURE;  Surgeon: [name], MD;  Location:  SURGERY CENTER;  Service: Orthopedics;  Laterality: Left;  LEFT DISTAL RADIUS OSTEOTOMY WITH BONE GRAFT   RIGHT/LEFT HEART CATH AND CORONARY ANGIOGRAPHY N/A 04/09/2021   Procedure: RIGHT/LEFT HEART CATH AND CORONARY ANGIOGRAPHY;  Surgeon: [name], MD;  Location: [facility];  Service: Cardiovascular;  Laterality: N/A;   TENDON REPAIR Left 02/07/2013   Procedure: LEFT EXTENSOR INDICIS PROPRIUS  TO EXTENSOR POLLICIS LONGUS TENDON TRANSFER;  Surgeon: [name], MD;  Location:  SURGERY CENTER;  Service: Orthopedics;  Laterality: Left;   WISDOM TOOTH EXTRACTION     WRIST SURGERY     fx only    SOCIAL HISTORY: Social History   Socioeconomic History   Marital status: Single    Spouse name: Not on file   Number of children: Not on file   Years of education: Not on file   Highest education level: Not on file  Occupational History   Not on file  Tobacco Use   Smoking status: Former    Types: Cigars    Quit date: 10/11/2020    Years since quitting: 2.7   Smokeless tobacco: Never   Tobacco comments:    smokes 1 cigar a week  Vaping Use   Vaping status: Never Used  Substance and Sexual Activity   Alcohol use: No   Drug use: No   Sexual activity: Not on file  Other Topics Concern   Not on file  Social History Narrative   Not on file   Social Determinants of Health   Financial Resource Strain: High Risk (07/17/2021)   Overall Financial Resource Strain (CARDIA)    Difficulty of Paying Living Expenses: Very hard  Food Insecurity: Food Insecurity Present (04/10/2021)   Hunger Vital Sign    Worried About Running Out of Food in the Last Year: Sometimes true    Ran Out of Food in the Last Year:  Sometimes true  Transportation Needs: No Transportation Needs (04/10/2021)   PRAPARE - Administrator, Civil Service (Medical): No    Lack of Transportation (Non-Medical): No  Physical Activity: Not on file  Stress: Stress Concern Present (09/01/2021)   [employer] of Occupational Health - Occupational Stress Questionnaire    Feeling of Stress : Very much  Social Connections: Not on file  Intimate Partner Violence: Not on file    FAMILY HISTORY: Family History  Problem Relation Age of Onset   Heart attack Father    Sudden Cardiac Death Father 55   Diabetes Neg Hx    Hyperlipidemia Neg Hx    Hypertension Neg Hx     ALLERGIES:  is allergic to rituxan [rituximab] and hydrocodone.  MEDICATIONS:  Current Outpatient Medications  Medication Sig Dispense Refill   acetaminophen (TYLENOL) 500 MG tablet Take 500 mg by mouth every 6 (six) hours as needed. (Patient  not taking: Reported on 01/10/2023)     albuterol (PROAIR HFA) 108 (90 Base) MCG/ACT inhaler Inhale 2 puffs into the lungs every 6 (six) hours as needed for wheezing or shortness of breath. 6.7 g 2   carvedilol (COREG) 3.125 MG tablet Take 1 tablet by mouth 2 times daily. 60 tablet 6   chlorhexidine (HIBICLENS) 4 % external liquid Apply topically daily as needed. Dilute with water and clean wound with this.  Rinse thoroughly afterwards 237 mL 0   clobetasol cream (TEMOVATE) 0.05 % Apply 1 Application topically 2 (two) times daily. Apply topically to the arms and hands twice daily for up to two weeks 30 g 2   dapagliflozin propanediol (FARXIGA) 10 MG TABS tablet Take 1 tablet (10 mg total) by mouth daily before breakfast. 30 tablet 11   diazepam (VALIUM) 5 MG tablet Take 1 tablet (5 mg total) by mouth every 12 (twelve) hours as needed for anxiety. 60 tablet 0   dronabinol (MARINOL) 5 MG capsule Take 1 capsule (5 mg) by mouth 2 times daily before a meal. 60 capsule 0   eplerenone (INSPRA) 25 MG tablet Take 0.5 tablets  (12.5 mg total) by mouth daily. NEEDS FOLLOW UP APPOINTMENT FOR MORE REFILLS 15 tablet 3   ferrous sulfate (FEROSUL) 325 (65 FE) MG tablet Take 1 tablet by mouth daily with breakfast. Please take with a source of Vitamin C 90 tablet 3   ibrutinib (IMBRUVICA) 420 MG tablet Take 1 tablet (420 mg total) by mouth daily. 28 tablet 2   methadone (DOLOPHINE) 10 MG tablet Take 1 tablet (10 mg total) by mouth every 8 (eight) hours. 90 tablet 0   naloxone (NARCAN) nasal spray 4 mg/0.1 mL Take for overdose 1 each 0   naloxone (NARCAN) nasal spray 4 mg/0.1 mL Take for overdose as directed 2 each 0   ondansetron (ZOFRAN) 8 MG tablet Take 1 tablet (8 mg) by mouth every 8 hours as needed. 30 tablet 0   Oxycodone HCl 10 MG TABS Take 1 tablet (10 mg total) by mouth every 4 (four) hours as needed. 90 tablet 0   potassium chloride SA (KLOR-CON M) 20 MEQ tablet Take 1 tablet (20 mEq total) by mouth daily. 30 tablet 1   sacubitril-valsartan (ENTRESTO) 24-26 MG Take 1 tablet by mouth 2 (two) times daily. 180 tablet 3   No current facility-administered medications for this visit.    REVIEW OF SYSTEMS:   Constitutional: ( - ) fevers, ( - )  chills , ( - ) night sweats Eyes: ( - ) blurriness of vision, ( - ) double vision, ( - ) watery eyes Ears, nose, mouth, throat, and face: ( - ) mucositis, ( - ) sore throat Respiratory: ( - ) cough, ( - ) dyspnea, ( - ) wheezes Cardiovascular: ( - ) palpitation, ( - ) chest discomfort, ( - ) lower extremity swelling Gastrointestinal:  (- ) nausea, ( - ) heartburn, ( - ) change in bowel habits Skin: ( - ) abnormal skin rashes Lymphatics: ( - ) new lymphadenopathy, ( - ) easy bruising Neurological: ( - ) numbness, ( - ) tingling, ( - ) new weaknesses Behavioral/Psych: ( - ) mood change, ( - ) new changes  All other systems were reviewed with the patient and are negative.  PHYSICAL EXAMINATION: ECOG PERFORMANCE STATUS: 2 - Symptomatic, <50% confined to bed  Vitals:   07/18/23  1438  BP: 102/83  Pulse: 74  Resp: 16  Temp: 97.8  F (36.6 C)  SpO2: 100%    Filed Weights   07/18/23 1438  Weight: 133 lb (60.3 kg)     GENERAL: Chronically ill appearing middle-aged Caucasian male, alert, no distress and comfortable SKIN: Dry cracking rash of hands bilaterally.  No issues on the chest, face, or lower extremities.  Skin color, texture, turgor are normal, no rashes or significant lesions.  EYES: conjunctiva are pink and non-injected, sclera clear LUNGS: clear to auscultation and percussion with normal breathing effort HEART: regular rate & rhythm and no murmurs and no lower extremity edema  MSK: Bilateral edema of hands.  PSYCH: alert & oriented x 3, fluent speech NEURO: no focal motor/sensory deficits   LABORATORY DATA:  I have reviewed the data as listed    Latest Ref Rng & Units 07/18/2023    2:13 PM 06/22/2023   10:06 AM 05/18/2023   10:59 AM  CBC  WBC 4.0 - 10.5 K/uL 2.3  5.2  7.3   Hemoglobin 13.0 - 17.0 g/dL 16.1  09.6  04.5   Hematocrit 39.0 - 52.0 % 37.0  39.7  33.5   Platelets 150 - 400 K/uL 197  191  157        Latest Ref Rng & Units 07/18/2023    2:13 PM 06/22/2023   10:06 AM 05/18/2023   10:59 AM  CMP  Glucose 70 - 99 mg/dL 89  409  84   BUN 8 - 23 mg/dL 13  11  12    Creatinine 0.61 - 1.24 mg/dL 8.11  9.14  7.82   Sodium 135 - 145 mmol/L 138  139  137   Potassium 3.5 - 5.1 mmol/L 4.1  4.3  3.9   Chloride 98 - 111 mmol/L 109  106  103   CO2 22 - 32 mmol/L 24  27  30    Calcium 8.9 - 10.3 mg/dL 8.8  9.4  8.5   Total Protein 6.5 - 8.1 g/dL 5.6  6.5  6.0   Total Bilirubin 0.3 - 1.2 mg/dL 0.5  0.9  0.5   Alkaline Phos 38 - 126 U/L 56  61  63   AST 15 - 41 U/L 13  17  12    ALT 0 - 44 U/L 8  11  6      Lab Results  Component Value Date   MPROTEIN 0.1 (H) 06/22/2023   MPROTEIN 0.1 (H) 05/18/2023   MPROTEIN 0.1 (H) 04/20/2023   Lab Results  Component Value Date   KPAFRELGTCHN 26.0 (H) 06/22/2023   KPAFRELGTCHN 20.3 (H) 05/18/2023    KPAFRELGTCHN 28.0 (H) 04/20/2023   LAMBDASER 6.7 06/22/2023   LAMBDASER 5.8 05/18/2023   LAMBDASER 5.2 (L) 04/20/2023   KAPLAMBRATIO 3.88 (H) 06/22/2023   KAPLAMBRATIO 3.50 (H) 05/18/2023   KAPLAMBRATIO 5.38 (H) 04/20/2023    RADIOGRAPHIC STUDIES: No results found.  ASSESSMENT & PLAN Connor Solomon 62 y.o. male with medical history significant for Waldenstrom's macroglobulinemia who presents for a follow up visit.   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  There is no clear evidence of hyperviscosity syndrome at this time.  At this time we will plan to proceed with ibrutinib and rituximab therapy.  One could also consider Bendamustine and rituximab therapy but given the patient's degree of anemia I would prefer pursuing ibrutinib and rituximab at this time.  Additionally ibrutinib/rituximab are category 1 recommendations per the  NCCN guidelines.  The treatment will consist of ibrutinib 420 mg p.o. daily with rituximab on weeks 1 through 4 as well as weeks 27 through 30.  Previously we discussed the risks and benefits of therapy including (but not limited to) risk of bleeding, fatigue, atypical infections, and cardiac arrhythmias.  The patient voices understanding of this plan moving forward.   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 --Started second round of weekly rituximab on 12/02/2021. Continued weekly x 4 weeks.   --last rituximab dose held due to missed visits/illness.  Plan: --Labs today were reviewed without any intervention needed.  Labs  show white cell count 2.3, Hgb 12.2, MCV 85.3, Plt 197  Pending SPEP and sFLC levels but prior levels appear stable. --Currently on ibrutinib 420 mg PO daily.  --RTC in 4 weeks with labs   # Lesion on Back Of Head- resolved  -- Patient has a ping-pong ball sized lesion on the back of his head.  It appeared to be draining and has some dried blood and crusting. -- Patient evaluated by Dr. Onalee Hua in dermatology.  Lesion was biopsied and found not to be cancerous. -- Appears to be healing well and improving greatly.  Continue per Dr. Kermit Balo recommendations.  # Rash-improved markedly -- Affecting hands bilaterally, upper extremities bilaterally, and back.  Not affecting face, chest, or lower extremities. -- Patient has dryness and cracking as well as erythema on the upper extremities. -- Etiology is unclear.  Patient is using hydrating creams but is not effective.  Made referral to dermatology for evaluation, he is established with Dr. Langston Reusing.  # Hypokalemia -- Potassium 4.1 continue to monitor   #Pain Management --Pain was poorly controlled on hydrocodone therapy previously.  --Patient was previously admitted for for opioid overdose with presumed fentanyl --Currently pain regimen includes methadone 10 mg TID and oxycodone 10 mg PRN for breakthrough pain per palliative care.  -- Appreciate recommendations of palliative care.  #Normocytic Anemia  --Hgb 12.2 --previously there was a component of iron deficiency anemia as well.  Currently on PO ferrous sulfate. --Continue to monitor.  #Nausea and vomiting--improved -- Concern that the nausea and vomiting may not be related to the ibrutinib therapy and potentially tied to poor gastric motility due to opioid prescription.  --Unable to tolerate olanzopine so discontinued --Not taking metoclopramide 10 mg every 8 hours  --continue zofran PRN.   #Fatigue #Anxiety/Depression: --Fatigue and depression have worsened recently --Anxiety  has improved with Valium  --Will request further evaluation by palliative care team, scheduled for next visit today  #Supportive Care -- chemotherapy education complete -- port placement not required   No orders of the defined types were placed in this encounter.  All questions were answered. The patient knows to call the clinic with any problems, questions or concerns.  I have spent a total of 30 minutes minutes of face-to-face and non-face-to-face time, preparing to see the patient,  performing a medically appropriate examination, counseling and educating the patient, ordering medications/tests, referring and communicating with other health care professionals, documenting clinical information in the electronic health record, and care coordination.   Ulysees Barns, MD Department of Hematology/Oncology [facility] Phone: [phone] Pager: [phone] Email: [email or web address]  07/18/2023 5:05 PM  Dimopoulos MA, Tedeschi A, Trotman J, Garca-Sanz R, Macdonald D, Leblond V, Mahe B, Herbaux C, Tam C, Orsucci L, Palomba ML, Matous JV, Shustik C, Kastritis E, [place], Li J, Salman Z, Graef T, Buske C; iNNOVATE Study Group and the [employer]. Phase 3 Trial of Ibrutinib plus Rituximab in Waldenstrm's Macroglobulinemia. [facility] Mis J Med. 2018 Jun 21;378(25):2399-2410.   --At 30 months, the progression-free survival rate was 82% with ibrutinib-rituximab versus 28% with placebo-rituximab (hazard ratio for progression or death, 0.20; P<0.001).

## 2023-07-19 ENCOUNTER — Other Ambulatory Visit: Payer: Self-pay

## 2023-07-19 LAB — KAPPA/LAMBDA LIGHT CHAINS
Kappa free light chain: 27.5 mg/L — ABNORMAL HIGH (ref 3.3–19.4)
Kappa, lambda light chain ratio: 3.72 — ABNORMAL HIGH (ref 0.26–1.65)
Lambda free light chains: 7.4 mg/L (ref 5.7–26.3)

## 2023-07-20 LAB — MULTIPLE MYELOMA PANEL, SERUM
Albumin SerPl Elph-Mcnc: 3.5 g/dL (ref 2.9–4.4)
Albumin/Glob SerPl: 2 — ABNORMAL HIGH (ref 0.7–1.7)
Alpha 1: 0.2 g/dL (ref 0.0–0.4)
Alpha2 Glob SerPl Elph-Mcnc: 0.5 g/dL (ref 0.4–1.0)
B-Globulin SerPl Elph-Mcnc: 0.7 g/dL (ref 0.7–1.3)
Gamma Glob SerPl Elph-Mcnc: 0.4 g/dL (ref 0.4–1.8)
Globulin, Total: 1.8 g/dL — ABNORMAL LOW (ref 2.2–3.9)
IgA: 25 mg/dL — ABNORMAL LOW (ref 61–437)
IgG (Immunoglobin G), Serum: 405 mg/dL — ABNORMAL LOW (ref 603–1613)
IgM (Immunoglobulin M), Srm: 109 mg/dL (ref 20–172)
M Protein SerPl Elph-Mcnc: 0.1 g/dL — ABNORMAL HIGH
Total Protein ELP: 5.3 g/dL — ABNORMAL LOW (ref 6.0–8.5)

## 2023-07-25 ENCOUNTER — Other Ambulatory Visit (HOSPITAL_COMMUNITY): Payer: Self-pay

## 2023-07-25 ENCOUNTER — Other Ambulatory Visit: Payer: Self-pay | Admitting: Nurse Practitioner

## 2023-07-25 DIAGNOSIS — Z515 Encounter for palliative care: Secondary | ICD-10-CM

## 2023-07-25 DIAGNOSIS — C8800 Waldenstrom macroglobulinemia not having achieved remission: Secondary | ICD-10-CM

## 2023-07-25 DIAGNOSIS — G893 Neoplasm related pain (acute) (chronic): Secondary | ICD-10-CM

## 2023-07-25 MED ORDER — OXYCODONE HCL 10 MG PO TABS
10.0000 mg | ORAL_TABLET | ORAL | 0 refills | Status: DC | PRN
Start: 2023-07-27 — End: 2023-08-08
  Filled 2023-07-27: qty 90, 15d supply, fill #0

## 2023-07-25 MED ORDER — METHADONE HCL 10 MG PO TABS
10.0000 mg | ORAL_TABLET | Freq: Three times a day (TID) | ORAL | 0 refills | Status: DC
Start: 2023-07-28 — End: 2023-08-22
  Filled 2023-07-28: qty 90, 30d supply, fill #0

## 2023-07-26 ENCOUNTER — Other Ambulatory Visit (HOSPITAL_COMMUNITY): Payer: Self-pay

## 2023-07-27 ENCOUNTER — Other Ambulatory Visit (HOSPITAL_COMMUNITY): Payer: Self-pay

## 2023-07-28 ENCOUNTER — Other Ambulatory Visit: Payer: Self-pay

## 2023-07-28 ENCOUNTER — Other Ambulatory Visit (HOSPITAL_COMMUNITY): Payer: Self-pay

## 2023-08-01 ENCOUNTER — Other Ambulatory Visit (HOSPITAL_COMMUNITY): Payer: Self-pay

## 2023-08-01 ENCOUNTER — Other Ambulatory Visit: Payer: Self-pay

## 2023-08-02 ENCOUNTER — Other Ambulatory Visit: Payer: Self-pay

## 2023-08-08 ENCOUNTER — Other Ambulatory Visit: Payer: Self-pay | Admitting: Nurse Practitioner

## 2023-08-08 ENCOUNTER — Other Ambulatory Visit: Payer: Self-pay

## 2023-08-08 ENCOUNTER — Other Ambulatory Visit (HOSPITAL_COMMUNITY): Payer: Self-pay

## 2023-08-08 ENCOUNTER — Other Ambulatory Visit (HOSPITAL_COMMUNITY): Payer: Self-pay | Admitting: Emergency Medicine

## 2023-08-08 DIAGNOSIS — G893 Neoplasm related pain (acute) (chronic): Secondary | ICD-10-CM

## 2023-08-08 DIAGNOSIS — Z515 Encounter for palliative care: Secondary | ICD-10-CM

## 2023-08-08 DIAGNOSIS — C8800 Waldenstrom macroglobulinemia not having achieved remission: Secondary | ICD-10-CM

## 2023-08-08 MED ORDER — OXYCODONE HCL 10 MG PO TABS
10.0000 mg | ORAL_TABLET | ORAL | 0 refills | Status: DC | PRN
  Filled 2023-08-08: qty 90, 15d supply, fill #0

## 2023-08-09 ENCOUNTER — Other Ambulatory Visit (HOSPITAL_COMMUNITY): Payer: Self-pay

## 2023-08-10 ENCOUNTER — Other Ambulatory Visit (HOSPITAL_COMMUNITY): Payer: Self-pay

## 2023-08-10 ENCOUNTER — Other Ambulatory Visit: Payer: Self-pay

## 2023-08-12 ENCOUNTER — Other Ambulatory Visit (HOSPITAL_COMMUNITY): Payer: Self-pay

## 2023-08-12 ENCOUNTER — Other Ambulatory Visit (HOSPITAL_COMMUNITY): Payer: Self-pay | Admitting: Internal Medicine

## 2023-08-12 ENCOUNTER — Other Ambulatory Visit: Payer: Self-pay | Admitting: Nurse Practitioner

## 2023-08-12 ENCOUNTER — Other Ambulatory Visit: Payer: Self-pay

## 2023-08-12 DIAGNOSIS — C8800 Waldenstrom macroglobulinemia not having achieved remission: Secondary | ICD-10-CM

## 2023-08-12 DIAGNOSIS — Z515 Encounter for palliative care: Secondary | ICD-10-CM

## 2023-08-12 DIAGNOSIS — G893 Neoplasm related pain (acute) (chronic): Secondary | ICD-10-CM

## 2023-08-12 DIAGNOSIS — G4700 Insomnia, unspecified: Secondary | ICD-10-CM

## 2023-08-12 DIAGNOSIS — Z419 Encounter for procedure for purposes other than remedying health state, unspecified: Secondary | ICD-10-CM | POA: Diagnosis not present

## 2023-08-12 MED ORDER — DIAZEPAM 5 MG PO TABS
5.0000 mg | ORAL_TABLET | Freq: Two times a day (BID) | ORAL | 0 refills | Status: DC | PRN
  Filled 2023-08-12 (×2): qty 60, 30d supply, fill #0

## 2023-08-15 ENCOUNTER — Other Ambulatory Visit: Payer: Self-pay | Admitting: Hematology and Oncology

## 2023-08-15 ENCOUNTER — Encounter: Payer: Self-pay | Admitting: Nurse Practitioner

## 2023-08-15 ENCOUNTER — Inpatient Hospital Stay: Payer: Medicaid Other | Attending: Hematology and Oncology

## 2023-08-15 ENCOUNTER — Inpatient Hospital Stay (HOSPITAL_BASED_OUTPATIENT_CLINIC_OR_DEPARTMENT_OTHER): Payer: Medicaid Other | Admitting: Hematology and Oncology

## 2023-08-15 ENCOUNTER — Inpatient Hospital Stay (HOSPITAL_BASED_OUTPATIENT_CLINIC_OR_DEPARTMENT_OTHER): Payer: Medicaid Other | Admitting: Nurse Practitioner

## 2023-08-15 ENCOUNTER — Other Ambulatory Visit (HOSPITAL_COMMUNITY): Payer: Self-pay

## 2023-08-15 VITALS — BP 139/89 | HR 75 | Temp 97.7°F | Resp 16 | Wt 132.4 lb

## 2023-08-15 DIAGNOSIS — I110 Hypertensive heart disease with heart failure: Secondary | ICD-10-CM | POA: Insufficient documentation

## 2023-08-15 DIAGNOSIS — K5900 Constipation, unspecified: Secondary | ICD-10-CM | POA: Diagnosis not present

## 2023-08-15 DIAGNOSIS — C8800 Waldenstrom macroglobulinemia not having achieved remission: Secondary | ICD-10-CM | POA: Insufficient documentation

## 2023-08-15 DIAGNOSIS — G893 Neoplasm related pain (acute) (chronic): Secondary | ICD-10-CM | POA: Diagnosis not present

## 2023-08-15 DIAGNOSIS — D472 Monoclonal gammopathy: Secondary | ICD-10-CM | POA: Diagnosis not present

## 2023-08-15 DIAGNOSIS — Z8249 Family history of ischemic heart disease and other diseases of the circulatory system: Secondary | ICD-10-CM | POA: Diagnosis not present

## 2023-08-15 DIAGNOSIS — I5022 Chronic systolic (congestive) heart failure: Secondary | ICD-10-CM | POA: Diagnosis not present

## 2023-08-15 DIAGNOSIS — Z79899 Other long term (current) drug therapy: Secondary | ICD-10-CM | POA: Diagnosis not present

## 2023-08-15 DIAGNOSIS — D509 Iron deficiency anemia, unspecified: Secondary | ICD-10-CM | POA: Diagnosis not present

## 2023-08-15 DIAGNOSIS — Z515 Encounter for palliative care: Secondary | ICD-10-CM

## 2023-08-15 DIAGNOSIS — F32A Depression, unspecified: Secondary | ICD-10-CM | POA: Insufficient documentation

## 2023-08-15 DIAGNOSIS — F419 Anxiety disorder, unspecified: Secondary | ICD-10-CM | POA: Diagnosis not present

## 2023-08-15 DIAGNOSIS — Z885 Allergy status to narcotic agent status: Secondary | ICD-10-CM | POA: Insufficient documentation

## 2023-08-15 DIAGNOSIS — Z5986 Financial insecurity: Secondary | ICD-10-CM | POA: Diagnosis not present

## 2023-08-15 DIAGNOSIS — Z87891 Personal history of nicotine dependence: Secondary | ICD-10-CM | POA: Diagnosis not present

## 2023-08-15 DIAGNOSIS — Z79620 Long term (current) use of immunosuppressive biologic: Secondary | ICD-10-CM | POA: Diagnosis not present

## 2023-08-15 LAB — CBC WITH DIFFERENTIAL (CANCER CENTER ONLY)
Abs Immature Granulocytes: 0.01 10*3/uL (ref 0.00–0.07)
Basophils Absolute: 0 10*3/uL (ref 0.0–0.1)
Basophils Relative: 1 %
Eosinophils Absolute: 0.5 10*3/uL (ref 0.0–0.5)
Eosinophils Relative: 11 %
HCT: 42.8 % (ref 39.0–52.0)
Hemoglobin: 14.6 g/dL (ref 13.0–17.0)
Immature Granulocytes: 0 %
Lymphocytes Relative: 19 %
Lymphs Abs: 0.9 10*3/uL (ref 0.7–4.0)
MCH: 28.5 pg (ref 26.0–34.0)
MCHC: 34.1 g/dL (ref 30.0–36.0)
MCV: 83.4 fL (ref 80.0–100.0)
Monocytes Absolute: 0.3 10*3/uL (ref 0.1–1.0)
Monocytes Relative: 6 %
Neutro Abs: 3 10*3/uL (ref 1.7–7.7)
Neutrophils Relative %: 63 %
Platelet Count: 168 10*3/uL (ref 150–400)
RBC: 5.13 MIL/uL (ref 4.22–5.81)
RDW: 13.8 % (ref 11.5–15.5)
WBC Count: 4.7 10*3/uL (ref 4.0–10.5)
nRBC: 0 % (ref 0.0–0.2)

## 2023-08-15 LAB — CMP (CANCER CENTER ONLY)
ALT: 9 U/L (ref 0–44)
AST: 14 U/L — ABNORMAL LOW (ref 15–41)
Albumin: 4.1 g/dL (ref 3.5–5.0)
Alkaline Phosphatase: 62 U/L (ref 38–126)
Anion gap: 3 — ABNORMAL LOW (ref 5–15)
BUN: 10 mg/dL (ref 8–23)
CO2: 28 mmol/L (ref 22–32)
Calcium: 9.2 mg/dL (ref 8.9–10.3)
Chloride: 105 mmol/L (ref 98–111)
Creatinine: 0.93 mg/dL (ref 0.61–1.24)
GFR, Estimated: >60 mL/min (ref >60)
Glucose, Bld: 86 mg/dL (ref 70–99)
Potassium: 4.3 mmol/L (ref 3.5–5.1)
Sodium: 136 mmol/L (ref 135–145)
Total Bilirubin: 0.6 mg/dL (ref <1.2)
Total Protein: 6.2 g/dL — ABNORMAL LOW (ref 6.5–8.1)

## 2023-08-15 LAB — LACTATE DEHYDROGENASE: LDH: 144 U/L (ref 98–192)

## 2023-08-15 MED ORDER — DAPAGLIFLOZIN PROPANEDIOL 10 MG PO TABS
10.0000 mg | ORAL_TABLET | Freq: Every day | ORAL | 3 refills | Status: DC
  Filled 2023-08-15 (×2): qty 30, 30d supply, fill #0
  Filled 2023-09-27 (×2): qty 30, 30d supply, fill #1
  Filled 2023-11-04 – 2023-11-19 (×2): qty 30, 30d supply, fill #2
  Filled 2024-01-02: qty 30, 30d supply, fill #3

## 2023-08-15 NOTE — Progress Notes (Unsigned)
[facility] [phone]   [phone]  PROGRESS NOTE  Patient Care Team: [name], DO as PCP - General Pickenpack-Cousar, [name], NP as Nurse Practitioner (Nurse Practitioner)  Hematological/Oncological History # Waldenstrom's Macroglobulinemia # IgM Monoclonal Gammopathy 04/07/2021: CT A/p showed mildly enlarged retroperitoneal lymph nodes and bilateral inguinal lymph nodes.  04/08/2021: IgM Kappa M protein 1.2 04/09/2021: WBC 11.1, Hgb 9.5, MCV 75.4, Plt 525 04/20/2021: Kappa 977, Lambda 7.8, K/L ratio 125.28. Serum viscosity 1.8 05/06/2021: Establish care with Dr. 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 Infusion reaction with ritux, held halfway through.  06/12/2021: Cycle 2 Day 1 of Ibrutinib + Rituximab 06/19/2021: Cycle 3 Day 1 of Ibrutinib + Rituximab 06/26/2021: Cycle 4 Day 1 of rituximab. Continued on daily ibrutinib 12/02/2021: Cycle 5 Day 1 of Ibrutinib + Rituximab 12/09/2021: Cycle 6 Day 1 of Ibrutinib + Rituximab 12/16/2021: Cycle 7 Day 1 of Ibrutinib + Rituximab 12/23/2021: Held Cycle 8 of Ritxumab due to nausea/vomiting, elevated creatinine, finger infection  Interval History:  Connor Solomon 62 y.o. male with medical history significant for Waldenstrom's macroglobulinemia who presents for a follow up visit. The patient's last visit was on 07/18/23. In the interim since the last visit he has continued on Ibrutinib therapy.   On exam today Mr. Marszalek reports he has been hurting some more in his hips, knees, shins.  He reports this is atypical for his pain.  He reports it is mostly in his joints.  He reports that he is not having any skin issues in his hands are at baseline.  The lesion on his head continues to shrink though there is still a small lump.  He  reports his energy is "not good".  He currently reports is about a 5 out of 10.  He reports his appetite has been okay.  He has not been eating particularly well.  He notes he has not had any infectious symptoms such as runny nose, sore throat, or cough.  He notes he has been taking his Zofran regularly and has a good supply.  He has not noticed any bleeding, bruising, or dark stools.  He notes his pain is about a 6 or 7 out of 10 today.  His health has been steady since her last visit. Overall he is willing and able to proceed with ibrutinib therapy. He is tolerating ibrutinib therapy at this time.  He denies fevers, chills, night sweats, shortness of breath, chest pain or cough. He has no other complaints. Full 10 point ROS is listed below.   MEDICAL HISTORY:  Past Medical History:  Diagnosis Date   Acute heart failure (HCC) 04/08/2021   Acute HFrEF (heart failure with reduced ejection fraction) (HCC) 04/07/2021   Arthritis    hands, elbows bilaterally   Cancer (HCC)    Full dentures    Hypertension    Pt states he doen not have HTN and has never been treated for HTN   Kidney stone on left side last stone 4-5 yrs ago   recurrent (3 episodes)   Panic disorder    pt states has resolved   Syncope    resolved- was related to panic attacks    SURGICAL HISTORY: Past Surgical History:  Procedure Laterality Date   CARPAL METACARPAL FUSION WITH DISTAL RADIAL BONE GRAFT Left 05/30/2013   Procedure: LEFT THUMB METACARPAL JOINT  FUSION;  Surgeon: [name], MD;  Location: [facility];  Service: Orthopedics;  Laterality: Left;   ESOPHAGOGASTRODUODENOSCOPY N/A 02/12/2014   Procedure: ESOPHAGOGASTRODUODENOSCOPY (EGD);  Surgeon: [name], MD;  Location: [facility] ENDOSCOPY;  Service: Endoscopy;  Laterality: N/A;   FOREIGN BODY REMOVAL N/A 02/12/2014   Procedure: FOREIGN BODY REMOVAL;  Surgeon: [name], MD;  Location: [facility] ENDOSCOPY;  Service: Endoscopy;  Laterality: N/A;   OPEN  REDUCTION INTERNAL FIXATION (ORIF) DISTAL RADIAL FRACTURE Left 11/29/2012   Procedure: OPEN REDUCTION INTERNAL FIXATION (ORIF) DISTAL RADIAL FRACTURE;  Surgeon: [name], MD;  Location: [facility];  Service: Orthopedics;  Laterality: Left;  LEFT DISTAL RADIUS OSTEOTOMY WITH BONE GRAFT   RIGHT/LEFT HEART CATH AND CORONARY ANGIOGRAPHY N/A 04/09/2021   Procedure: RIGHT/LEFT HEART CATH AND CORONARY ANGIOGRAPHY;  Surgeon: [name], MD;  Location: [facility];  Service: Cardiovascular;  Laterality: N/A;   TENDON REPAIR Left 02/07/2013   Procedure: LEFT EXTENSOR INDICIS PROPRIUS  TO EXTENSOR POLLICIS LONGUS TENDON TRANSFER;  Surgeon: [name], MD;  Location: [facility];  Service: Orthopedics;  Laterality: Left;   WISDOM TOOTH EXTRACTION     WRIST SURGERY     fx only    SOCIAL HISTORY: Social History   Socioeconomic History   Marital status: Single    Spouse name: Not on file   Number of children: Not on file   Years of education: Not on file   Highest education level: Not on file  Occupational History   Not on file  Tobacco Use   Smoking status: Former    Types: Cigars    Quit date: 10/11/2020    Years since quitting: 2.8   Smokeless tobacco: Never   Tobacco comments:    smokes 1 cigar a week  Vaping Use   Vaping status: Never Used  Substance and Sexual Activity   Alcohol use: No   Drug use: No   Sexual activity: Not on file  Other Topics Concern   Not on file  Social History Narrative   Not on file   Social Determinants of Health   Financial Resource Strain: High Risk (07/17/2021)   Overall Financial Resource Strain (CARDIA)    Difficulty of Paying Living Expenses: Very hard  Food Insecurity: Food Insecurity Present (04/10/2021)   Hunger Vital Sign    Worried About Running Out of Food in the Last Year: Sometimes true    Ran Out of Food in the Last Year: Sometimes true  Transportation Needs: No Transportation Needs  (04/10/2021)   PRAPARE - Administrator, Civil Service (Medical): No    Lack of Transportation (Non-Medical): No  Physical Activity: Not on file  Stress: Stress Concern Present (09/01/2021)   [employer] of Occupational Health - Occupational Stress Questionnaire    Feeling of Stress : Very much  Social Connections: Not on file  Intimate Partner Violence: Not on file    FAMILY HISTORY: Family History  Problem Relation Age of Onset   Heart attack Father    Sudden Cardiac Death Father 54   Diabetes Neg Hx    Hyperlipidemia Neg Hx    Hypertension Neg Hx     ALLERGIES:  is allergic to rituxan [rituximab] and hydrocodone.  MEDICATIONS:  Current Outpatient Medications  Medication Sig Dispense Refill   acetaminophen (TYLENOL) 500 MG tablet Take 500 mg by mouth every 6 (six) hours as needed. (Patient not taking: Reported on 01/10/2023)  albuterol (PROAIR HFA) 108 (90 Base) MCG/ACT inhaler Inhale 2 puffs into the lungs every 6 (six) hours as needed for wheezing or shortness of breath. 6.7 g 2   carvedilol (COREG) 3.125 MG tablet Take 1 tablet by mouth 2 times daily. 60 tablet 6   chlorhexidine (HIBICLENS) 4 % external liquid Apply topically daily as needed. Dilute with water and clean wound with this.  Rinse thoroughly afterwards 237 mL 0   clobetasol cream (TEMOVATE) 0.05 % Apply 1 Application topically 2 (two) times daily. Apply topically to the arms and hands twice daily for up to two weeks 30 g 2   dapagliflozin propanediol (FARXIGA) 10 MG TABS tablet Take 1 tablet (10 mg total) by mouth daily before breakfast. NEEDS FOLLOW UP APPOINTMENT FOR ANYMORE REFILLS 30 tablet 3   diazepam (VALIUM) 5 MG tablet Take 1 tablet (5 mg total) by mouth every 12 (twelve) hours as needed for anxiety. 60 tablet 0   dronabinol (MARINOL) 5 MG capsule Take 1 capsule (5 mg) by mouth 2 times daily before a meal. 60 capsule 0   eplerenone (INSPRA) 25 MG tablet Take 0.5 tablets (12.5 mg total) by  mouth daily. NEEDS FOLLOW UP APPOINTMENT FOR MORE REFILLS 15 tablet 3   ferrous sulfate (FEROSUL) 325 (65 FE) MG tablet Take 1 tablet by mouth daily with breakfast. Please take with a source of Vitamin C 90 tablet 3   ibrutinib (IMBRUVICA) 420 MG tablet Take 1 tablet (420 mg total) by mouth daily. 28 tablet 2   methadone (DOLOPHINE) 10 MG tablet Take 1 tablet (10 mg total) by mouth every 8 (eight) hours. 90 tablet 0   naloxone (NARCAN) nasal spray 4 mg/0.1 mL Take for overdose 1 each 0   naloxone (NARCAN) nasal spray 4 mg/0.1 mL Take for overdose as directed 2 each 0   ondansetron (ZOFRAN) 8 MG tablet Take 1 tablet (8 mg) by mouth every 8 hours as needed. 30 tablet 0   Oxycodone HCl 10 MG TABS Take 1 tablet (10 mg total) by mouth every 4 (four) hours as needed. 90 tablet 0   potassium chloride SA (KLOR-CON M) 20 MEQ tablet Take 1 tablet (20 mEq total) by mouth daily. 30 tablet 1   sacubitril-valsartan (ENTRESTO) 24-26 MG Take 1 tablet by mouth 2 (two) times daily. 180 tablet 3   No current facility-administered medications for this visit.    REVIEW OF SYSTEMS:   Constitutional: ( - ) fevers, ( - )  chills , ( - ) night sweats Eyes: ( - ) blurriness of vision, ( - ) double vision, ( - ) watery eyes Ears, nose, mouth, throat, and face: ( - ) mucositis, ( - ) sore throat Respiratory: ( - ) cough, ( - ) dyspnea, ( - ) wheezes Cardiovascular: ( - ) palpitation, ( - ) chest discomfort, ( - ) lower extremity swelling Gastrointestinal:  (- ) nausea, ( - ) heartburn, ( - ) change in bowel habits Skin: ( - ) abnormal skin rashes Lymphatics: ( - ) new lymphadenopathy, ( - ) easy bruising Neurological: ( - ) numbness, ( - ) tingling, ( - ) new weaknesses Behavioral/Psych: ( - ) mood change, ( - ) new changes  All other systems were reviewed with the patient and are negative.  PHYSICAL EXAMINATION: ECOG PERFORMANCE STATUS: 2 - Symptomatic, <50% confined to bed  Vitals:   08/15/23 1215  BP: 139/89   Pulse: 75  Resp: 16  Temp: 97.7 F (36.5  C)  SpO2: 100%     Filed Weights   08/15/23 1215  Weight: 132 lb 6.4 oz (60.1 kg)      GENERAL: Chronically ill appearing middle-aged Caucasian male, alert, no distress and comfortable SKIN: Dry cracking rash of hands bilaterally.  No issues on the chest, face, or lower extremities.  Skin color, texture, turgor are normal, no rashes or significant lesions.  EYES: conjunctiva are pink and non-injected, sclera clear LUNGS: clear to auscultation and percussion with normal breathing effort HEART: regular rate & rhythm and no murmurs and no lower extremity edema  MSK: Bilateral edema of hands.  PSYCH: alert & oriented x 3, fluent speech NEURO: no focal motor/sensory deficits   LABORATORY DATA:  I have reviewed the data as listed    Latest Ref Rng & Units 08/15/2023   11:37 AM 07/18/2023    2:13 PM 06/22/2023   10:06 AM  CBC  WBC 4.0 - 10.5 K/uL 4.7  2.3  5.2   Hemoglobin 13.0 - 17.0 g/dL 40.9  81.1  91.4   Hematocrit 39.0 - 52.0 % 42.8  37.0  39.7   Platelets 150 - 400 K/uL 168  197  191        Latest Ref Rng & Units 08/15/2023   11:37 AM 07/18/2023    2:13 PM 06/22/2023   10:06 AM  CMP  Glucose 70 - 99 mg/dL 86  89  782   BUN 8 - 23 mg/dL 10  13  11    Creatinine 0.61 - 1.24 mg/dL 9.56  2.13  0.86   Sodium 135 - 145 mmol/L 136  138  139   Potassium 3.5 - 5.1 mmol/L 4.3  4.1  4.3   Chloride 98 - 111 mmol/L 105  109  106   CO2 22 - 32 mmol/L 28  24  27    Calcium 8.9 - 10.3 mg/dL 9.2  8.8  9.4   Total Protein 6.5 - 8.1 g/dL 6.2  5.6  6.5   Total Bilirubin <1.2 mg/dL 0.6  0.5  0.9   Alkaline Phos 38 - 126 U/L 62  56  61   AST 15 - 41 U/L 14  13  17    ALT 0 - 44 U/L 9  8  11      Lab Results  Component Value Date   MPROTEIN 0.1 (H) 07/18/2023   MPROTEIN 0.1 (H) 06/22/2023   MPROTEIN 0.1 (H) 05/18/2023   Lab Results  Component Value Date   KPAFRELGTCHN 27.5 (H) 07/18/2023   KPAFRELGTCHN 26.0 (H) 06/22/2023   KPAFRELGTCHN  20.3 (H) 05/18/2023   LAMBDASER 7.4 07/18/2023   LAMBDASER 6.7 06/22/2023   LAMBDASER 5.8 05/18/2023   KAPLAMBRATIO 3.72 (H) 07/18/2023   KAPLAMBRATIO 3.88 (H) 06/22/2023   KAPLAMBRATIO 3.50 (H) 05/18/2023    RADIOGRAPHIC STUDIES: No results found.  ASSESSMENT & PLAN MORDCHE HEDGLIN 62 y.o. male with medical history significant for Waldenstrom's macroglobulinemia who presents for a follow up visit.   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  There is no clear evidence of hyperviscosity syndrome at this time.  At this time we will plan to proceed with ibrutinib and rituximab therapy.  One could also consider Bendamustine and rituximab therapy but given the patient's degree of anemia I would prefer pursuing ibrutinib and rituximab at this time.  Additionally ibrutinib/rituximab are category 1 recommendations per the NCCN  guidelines.  The treatment will consist of ibrutinib 420 mg p.o. daily with rituximab on weeks 1 through 4 as well as weeks 27 through 30.  Previously we discussed the risks and benefits of therapy including (but not limited to) risk of bleeding, fatigue, atypical infections, and cardiac arrhythmias.  The patient voices understanding of this plan moving forward.   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 --Started second round of weekly rituximab on 12/02/2021. Continued weekly x 4 weeks.   --last rituximab dose held due to missed visits/illness.  Plan: --Labs today were reviewed without any intervention needed.  Labs show white cell  count 4.7, hemoglobin 14.6, MCV 83.4, platelets 168. Pending SPEP and sFLC levels but prior levels appear stable. --Currently on ibrutinib 420 mg PO daily.  --RTC in 4 weeks with labs   # Lesion on Back Of Head- resolved  -- Patient had a ping-pong ball sized lesion on the back of his head.  It appeared to be draining and has some dried blood and crusting. -- Patient evaluated by Dr. Onalee Hua in dermatology.  Lesion was biopsied and found not to be cancerous. -- Appears to be healing well and improving greatly.  Continue per Dr. Kermit Balo recommendations.  # Rash-improved markedly -- Affecting hands bilaterally, upper extremities bilaterally, and back.  Not affecting face, chest, or lower extremities. -- Patient has dryness and cracking as well as erythema on the upper extremities. -- Etiology is unclear.  Patient is using hydrating creams but is not effective.  Made referral to dermatology for evaluation, he is established with Dr. Langston Reusing.  # Hypokalemia-resolved -- Potassium 4.3 continue to monitor   #Pain Management --Pain was poorly controlled on hydrocodone therapy previously.  --Patient was previously admitted for for opioid overdose with presumed fentanyl --Currently pain regimen includes methadone 10 mg TID and oxycodone 10 mg PRN for breakthrough pain per palliative care.  -- Appreciate recommendations of palliative care.  #Normocytic Anemia  --Hgb 14.6 --previously there was a component of iron deficiency anemia as well.  Currently on PO ferrous sulfate. --Continue to monitor.  #Nausea and vomiting--improved -- Concern that the nausea and vomiting may not be related to the ibrutinib therapy and potentially tied to poor gastric motility due to opioid prescription.  --Unable to tolerate olanzopine so discontinued --Not taking metoclopramide 10 mg every 8 hours  --continue zofran PRN.   #Fatigue #Anxiety/Depression: --Fatigue and depression have worsened  recently --Anxiety has improved with Valium  --Will request further evaluation by palliative care team, scheduled for next visit today  #Supportive Care -- chemotherapy education complete -- port placement not required   No orders of the defined types were placed in this encounter.  All questions were answered. The patient knows to call the clinic with any problems, questions or concerns.  I have spent a total of 30 minutes minutes of face-to-face and non-face-to-face time, preparing to see the patient,  performing a medically appropriate examination, counseling and educating the patient, ordering medications/tests, referring and communicating with other health care professionals, documenting clinical information in the electronic health record, and care coordination.   Ulysees Barns, MD Department of Hematology/Oncology [facility] at [facility] Phone: [phone] Pager: [phone] Email: [email or web address]  08/16/2023 9:00 AM  Dimopoulos  MA, Lennette Bihari, Trotman J, Garca-Sanz R, Macdonald D, Leblond V, Mahe B, Herbaux C, Tam C, Orsucci L, Palomba ML, Matous JV, Shustik C, Kastritis E, Advance, Li J, Salman Z, Graef T, Buske C; iNNOVATE Study Group and the [employer]. Phase 3 Trial of Ibrutinib plus Rituximab in Waldenstrm's Macroglobulinemia. [facility] Mis J Med. 2018 Jun 21;378(25):2399-2410.   --At 30 months, the progression-free survival rate was 82% with ibrutinib-rituximab versus 28% with placebo-rituximab (hazard ratio for progression or death, 0.20; P<0.001).

## 2023-08-15 NOTE — Progress Notes (Signed)
Palliative Medicine [facility]  [phone] Fax:(336) [phone]   Name: Connor Solomon Date: 08/15/2023 MRN: [id number]  DOB: [birth year]  Patient Care Team: [name], DO as PCP - General Pickenpack-Cousar, [name], NP as Nurse Practitioner (Nurse Practitioner)   REASON FOR CONSULTATION: Connor Solomon is a 62 y.o. 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 Palliative ask to see for symptom management.  SOCIAL HISTORY:     reports that he quit smoking about 2 years ago. His smoking use included cigars. He has never used smokeless tobacco. He reports that he does not drink alcohol and does not use drugs.  ADVANCE DIRECTIVES:    CODE STATUS:   PAST MEDICAL HISTORY: Past Medical History:  Diagnosis Date   Acute heart failure (HCC) 04/08/2021   Acute HFrEF (heart failure with reduced ejection fraction) (HCC) 04/07/2021   Arthritis    hands, elbows bilaterally   Cancer (HCC)    Full dentures    Hypertension    Pt states he doen not have HTN and has never been treated for HTN   Kidney stone on left side last stone 4-5 yrs ago   recurrent (3 episodes)   Panic disorder    pt states has resolved   Syncope    resolved- was related to panic attacks    PAST SURGICAL HISTORY:  Past Surgical History:  Procedure Laterality Date   CARPAL METACARPAL FUSION WITH DISTAL RADIAL BONE GRAFT Left 05/30/2013   Procedure: LEFT THUMB METACARPAL JOINT FUSION;  Surgeon: [name], MD;  Location: [facility];  Service: Orthopedics;  Laterality: Left;   ESOPHAGOGASTRODUODENOSCOPY N/A 02/12/2014   Procedure: ESOPHAGOGASTRODUODENOSCOPY (EGD);  Surgeon: [name], MD;  Location: [facility] ENDOSCOPY;  Service: Endoscopy;  Laterality: N/A;   FOREIGN BODY REMOVAL N/A 02/12/2014   Procedure: FOREIGN BODY REMOVAL;  Surgeon: [name], MD;  Location: [facility]  ENDOSCOPY;  Service: Endoscopy;  Laterality: N/A;   OPEN REDUCTION INTERNAL FIXATION (ORIF) DISTAL RADIAL FRACTURE Left 11/29/2012   Procedure: OPEN REDUCTION INTERNAL FIXATION (ORIF) DISTAL RADIAL FRACTURE;  Surgeon: [name], MD;  Location: [facility];  Service: Orthopedics;  Laterality: Left;  LEFT DISTAL RADIUS OSTEOTOMY WITH BONE GRAFT   RIGHT/LEFT HEART CATH AND CORONARY ANGIOGRAPHY N/A 04/09/2021   Procedure: RIGHT/LEFT HEART CATH AND CORONARY ANGIOGRAPHY;  Surgeon: [name], MD;  Location: [facility];  Service: Cardiovascular;  Laterality: N/A;   TENDON REPAIR Left 02/07/2013   Procedure: LEFT EXTENSOR INDICIS PROPRIUS  TO EXTENSOR POLLICIS LONGUS TENDON TRANSFER;  Surgeon: [name], MD;  Location: [facility];  Service: Orthopedics;  Laterality: Left;   WISDOM TOOTH EXTRACTION     WRIST SURGERY     fx only    HEMATOLOGY/ONCOLOGY HISTORY:  Oncology History  Waldenstrom macroglobulinemia  05/24/2021 Initial Diagnosis   Waldenstrom macroglobulinemia (HCC)   06/05/2021 - 12/30/2021 Chemotherapy   Patient is on Treatment Plan : NON-HODGKINS LYMPHOMA Rituximab q7d       ALLERGIES:  is allergic to rituxan [rituximab] and hydrocodone.  MEDICATIONS:  Current Outpatient Medications  Medication Sig Dispense Refill   acetaminophen (TYLENOL) 500 MG tablet Take 500 mg by mouth every 6 (six) hours as needed. (Patient not taking: Reported on 01/10/2023)     albuterol (PROAIR HFA) 108 (90 Base) MCG/ACT inhaler Inhale 2 puffs into the lungs every 6 (six) hours as  needed for wheezing or shortness of breath. 6.7 g 2   carvedilol (COREG) 3.125 MG tablet Take 1 tablet by mouth 2 times daily. 60 tablet 6   chlorhexidine (HIBICLENS) 4 % external liquid Apply topically daily as needed. Dilute with water and clean wound with this.  Rinse thoroughly afterwards 237 mL 0   clobetasol cream (TEMOVATE) 0.05 % Apply 1 Application topically 2 (two)  times daily. Apply topically to the arms and hands twice daily for up to two weeks 30 g 2   dapagliflozin propanediol (FARXIGA) 10 MG TABS tablet Take 1 tablet (10 mg total) by mouth daily before breakfast. 30 tablet 11   diazepam (VALIUM) 5 MG tablet Take 1 tablet (5 mg total) by mouth every 12 (twelve) hours as needed for anxiety. 60 tablet 0   dronabinol (MARINOL) 5 MG capsule Take 1 capsule (5 mg) by mouth 2 times daily before a meal. 60 capsule 0   eplerenone (INSPRA) 25 MG tablet Take 0.5 tablets (12.5 mg total) by mouth daily. NEEDS FOLLOW UP APPOINTMENT FOR MORE REFILLS 15 tablet 3   ferrous sulfate (FEROSUL) 325 (65 FE) MG tablet Take 1 tablet by mouth daily with breakfast. Please take with a source of Vitamin C 90 tablet 3   ibrutinib (IMBRUVICA) 420 MG tablet Take 1 tablet (420 mg total) by mouth daily. 28 tablet 2   methadone (DOLOPHINE) 10 MG tablet Take 1 tablet (10 mg total) by mouth every 8 (eight) hours. 90 tablet 0   naloxone (NARCAN) nasal spray 4 mg/0.1 mL Take for overdose 1 each 0   naloxone (NARCAN) nasal spray 4 mg/0.1 mL Take for overdose as directed 2 each 0   ondansetron (ZOFRAN) 8 MG tablet Take 1 tablet (8 mg) by mouth every 8 hours as needed. 30 tablet 0   Oxycodone HCl 10 MG TABS Take 1 tablet (10 mg total) by mouth every 4 (four) hours as needed. 90 tablet 0   potassium chloride SA (KLOR-CON M) 20 MEQ tablet Take 1 tablet (20 mEq total) by mouth daily. 30 tablet 1   sacubitril-valsartan (ENTRESTO) 24-26 MG Take 1 tablet by mouth 2 (two) times daily. 180 tablet 3   No current facility-administered medications for this visit.    VITAL SIGNS: There were no vitals taken for this visit. There were no vitals filed for this visit.     Estimated body mass index is 20.22 kg/m as calculated from the following:   Height as of 07/18/23: 5\' 8"  (1.727 m).   Weight as of 07/18/23: 133 lb (60.3 kg).   PERFORMANCE STATUS (ECOG) : 1 - Symptomatic but completely  ambulatory  Assessment NAD RRR Normal breathing pattern  AAO X4  IMPRESSION: Mr. Covelli present to clinic for symptom management follow-up. No acute distress. Denies nausea, vomiting, constipation, or diarrhea. Is trying to remain as active as possible. Tolerating all medications.   Neoplasm related pain Sarah reports overall pain is well controlled. Some days are better than others.   We discussed regimen:Methadone 10mg  three times daily. Oxycodone 10mg  as needed for breakthrough pain. Taking as prescribed. Reill request within appropriate timing.   We will continue close monitoring and adjust as needed.  No changes to current regimen at this time.  Constipation Controlled with diet and daily MiraLAX.  Appetite/decreased appetite.  Much improved. Weight  132lbs up from 130lbs on 6/12, 129lbs on 5/17.  4. Anxiety/Anxiousness   5. Goals of care   7/10: I created space and opportunity for  Mr. Meter to share his thoughts and feelings regarding his current health and condition. He is realistic speaking to his understanding of Christian faith. He is taking things one day at a time preparing himself for when his time comes. Until then he wishes to continue treating the treatable allowing himself every opportunity to thrive. He knows at anytime he can discuss with his medical team his wishes and focus on his comfort.   (5/16) Mr. Mancinas shares his realistic understanding of his condition. He and his wife are making sure all "affairs" are in order as his biggest worry is leaving his wife in a financial constraint. Reports communicating with his life insurance policy etc. Emotional support provided.    He is emotional expressing racing thoughts during idol time or interruptions in his sleep as his mind wonders thinking about worst case scenario. Is requesting something to assist with sleeping and anxiety. Acknowledged his request. Education provided on limited use in collaboration with other  medications including zyprexa. He verbalized understanding and aware  we will continue to support and monitor for needs.      PLAN: Methadone 10 mg every 8 hours. Close monitoring. Tolerating well. Pain controlled. Oxycodone 10 mg as needed for breakthrough pain.  Miralax daily for constipation Valium 5mg  at twice daily. Zofran as needed for nausea as prescribed.  I will plan to see patient back in 6-8 weeks in collaboration to other oncology appointments.   Patient expressed understanding and was in agreement with this plan. He also understands that He can call the clinic at any time with any questions, concerns, or complaints.    Any controlled substances utilized were prescribed in the context of palliative care. PDMP has been reviewed.    Visit consisted of counseling and education dealing with the complex and emotionally intense issues of symptom management and palliative care in the setting of serious and potentially life-threatening illness.Greater than 50%  of this time was spent counseling and coordinating care related to the above assessment and plan.  Willette Alma, AGPCNP-BC  Palliative Medicine Team/[facility]  *Please note that this is a verbal dictation therefore any spelling or grammatical errors are due to the "Dragon Medical One" system interpretation.

## 2023-08-16 ENCOUNTER — Other Ambulatory Visit (HOSPITAL_COMMUNITY): Payer: Self-pay

## 2023-08-16 LAB — KAPPA/LAMBDA LIGHT CHAINS
Kappa free light chain: 22.2 mg/L — ABNORMAL HIGH (ref 3.3–19.4)
Kappa, lambda light chain ratio: 4.72 — ABNORMAL HIGH (ref 0.26–1.65)
Lambda free light chains: 4.7 mg/L — ABNORMAL LOW (ref 5.7–26.3)

## 2023-08-18 ENCOUNTER — Other Ambulatory Visit (HOSPITAL_COMMUNITY): Payer: Self-pay | Admitting: Emergency Medicine

## 2023-08-18 ENCOUNTER — Other Ambulatory Visit: Payer: Self-pay | Admitting: Nurse Practitioner

## 2023-08-18 DIAGNOSIS — Z515 Encounter for palliative care: Secondary | ICD-10-CM

## 2023-08-18 DIAGNOSIS — G893 Neoplasm related pain (acute) (chronic): Secondary | ICD-10-CM

## 2023-08-18 DIAGNOSIS — C8800 Waldenstrom macroglobulinemia not having achieved remission: Secondary | ICD-10-CM

## 2023-08-18 LAB — MULTIPLE MYELOMA PANEL, SERUM
Albumin SerPl Elph-Mcnc: 3.9 g/dL (ref 2.9–4.4)
Albumin/Glob SerPl: 2.2 — ABNORMAL HIGH (ref 0.7–1.7)
Alpha 1: 0.2 g/dL (ref 0.0–0.4)
Alpha2 Glob SerPl Elph-Mcnc: 0.6 g/dL (ref 0.4–1.0)
B-Globulin SerPl Elph-Mcnc: 0.7 g/dL (ref 0.7–1.3)
Gamma Glob SerPl Elph-Mcnc: 0.4 g/dL (ref 0.4–1.8)
Globulin, Total: 1.8 g/dL — ABNORMAL LOW (ref 2.2–3.9)
IgA: 31 mg/dL — ABNORMAL LOW (ref 61–437)
IgG (Immunoglobin G), Serum: 392 mg/dL — ABNORMAL LOW (ref 603–1613)
IgM (Immunoglobulin M), Srm: 110 mg/dL (ref 20–172)
M Protein SerPl Elph-Mcnc: 0.1 g/dL — ABNORMAL HIGH
Total Protein ELP: 5.7 g/dL — ABNORMAL LOW (ref 6.0–8.5)

## 2023-08-19 ENCOUNTER — Other Ambulatory Visit (HOSPITAL_COMMUNITY): Payer: Self-pay

## 2023-08-19 MED ORDER — OXYCODONE HCL 10 MG PO TABS
10.0000 mg | ORAL_TABLET | ORAL | 0 refills | Status: DC | PRN
  Filled ????-??-??: fill #0

## 2023-08-22 ENCOUNTER — Other Ambulatory Visit (HOSPITAL_COMMUNITY): Payer: Self-pay

## 2023-08-22 ENCOUNTER — Other Ambulatory Visit: Payer: Self-pay | Admitting: Nurse Practitioner

## 2023-08-22 ENCOUNTER — Telehealth: Payer: Self-pay

## 2023-08-22 DIAGNOSIS — Z515 Encounter for palliative care: Secondary | ICD-10-CM

## 2023-08-22 DIAGNOSIS — C8800 Waldenstrom macroglobulinemia not having achieved remission: Secondary | ICD-10-CM

## 2023-08-22 DIAGNOSIS — G893 Neoplasm related pain (acute) (chronic): Secondary | ICD-10-CM

## 2023-08-22 MED ORDER — METHADONE HCL 10 MG PO TABS
10.0000 mg | ORAL_TABLET | Freq: Three times a day (TID) | ORAL | 0 refills | Status: DC
  Filled 2023-08-26: qty 90, 30d supply, fill #0

## 2023-08-22 MED ORDER — OXYCODONE HCL 10 MG PO TABS
10.0000 mg | ORAL_TABLET | ORAL | 0 refills | Status: DC | PRN
  Filled 2023-08-22: qty 90, 15d supply, fill #0

## 2023-08-22 NOTE — Telephone Encounter (Signed)
Pt called requesting a refill of oxycodone, see associated orders. Pt also mentioned a "sore on his back" pt reports that it is on the left side of his back, it is red and irritated and has a few smaller sores around it. Pt did not wish to come in to be seen for it. Pt instructed to stop using the peroxide that he was previously using and to wash the area with mild soap and water and to use OTC hydrocortisone cream. Pt verbalized understanding, no further needs at this time.

## 2023-08-24 ENCOUNTER — Other Ambulatory Visit: Payer: Self-pay

## 2023-08-24 ENCOUNTER — Other Ambulatory Visit (HOSPITAL_COMMUNITY): Payer: Self-pay

## 2023-08-26 ENCOUNTER — Other Ambulatory Visit (HOSPITAL_COMMUNITY): Payer: Self-pay

## 2023-08-30 ENCOUNTER — Other Ambulatory Visit (HOSPITAL_COMMUNITY): Payer: Self-pay

## 2023-09-01 ENCOUNTER — Other Ambulatory Visit (HOSPITAL_COMMUNITY): Payer: Self-pay

## 2023-09-01 ENCOUNTER — Other Ambulatory Visit: Payer: Self-pay

## 2023-09-01 ENCOUNTER — Other Ambulatory Visit: Payer: Self-pay | Admitting: Nurse Practitioner

## 2023-09-01 DIAGNOSIS — R634 Abnormal weight loss: Secondary | ICD-10-CM

## 2023-09-01 DIAGNOSIS — R630 Anorexia: Secondary | ICD-10-CM

## 2023-09-01 DIAGNOSIS — Z515 Encounter for palliative care: Secondary | ICD-10-CM

## 2023-09-01 DIAGNOSIS — C8800 Waldenstrom macroglobulinemia not having achieved remission: Secondary | ICD-10-CM

## 2023-09-01 MED ORDER — DRONABINOL 5 MG PO CAPS
5.0000 mg | ORAL_CAPSULE | Freq: Two times a day (BID) | ORAL | 0 refills | Status: DC
  Filled 2023-09-01: qty 60, 30d supply, fill #0

## 2023-09-02 ENCOUNTER — Other Ambulatory Visit (HOSPITAL_COMMUNITY): Payer: Self-pay

## 2023-09-02 ENCOUNTER — Other Ambulatory Visit: Payer: Self-pay

## 2023-09-02 ENCOUNTER — Other Ambulatory Visit: Payer: Self-pay | Admitting: Nurse Practitioner

## 2023-09-02 DIAGNOSIS — C8800 Waldenstrom macroglobulinemia not having achieved remission: Secondary | ICD-10-CM

## 2023-09-02 DIAGNOSIS — Z515 Encounter for palliative care: Secondary | ICD-10-CM

## 2023-09-02 DIAGNOSIS — G893 Neoplasm related pain (acute) (chronic): Secondary | ICD-10-CM

## 2023-09-02 MED ORDER — OXYCODONE HCL 10 MG PO TABS
10.0000 mg | ORAL_TABLET | ORAL | 0 refills | Status: DC | PRN
  Filled 2023-09-03: qty 90, 15d supply, fill #0

## 2023-09-03 ENCOUNTER — Other Ambulatory Visit (HOSPITAL_COMMUNITY): Payer: Self-pay

## 2023-09-05 ENCOUNTER — Other Ambulatory Visit: Payer: Self-pay

## 2023-09-05 ENCOUNTER — Other Ambulatory Visit (HOSPITAL_COMMUNITY): Payer: Self-pay

## 2023-09-09 ENCOUNTER — Other Ambulatory Visit: Payer: Self-pay | Admitting: *Deleted

## 2023-09-09 DIAGNOSIS — C8800 Waldenstrom macroglobulinemia not having achieved remission: Secondary | ICD-10-CM

## 2023-09-09 NOTE — Progress Notes (Unsigned)
Palliative Medicine [facility]  Telephone:(336) [phone] Fax:(336) [phone]   Name: Connor Solomon Date: 09/09/2023 MRN: [id number]  DOB: October 25, 1960  Patient Care Team: [name], DO as PCP - General Pickenpack-Cousar, [name], NP as Nurse Practitioner (Nurse Practitioner)   REASON FOR CONSULTATION: Connor Solomon is a 62 y.o. 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 Palliative ask to see for symptom management.  SOCIAL HISTORY:     reports that he quit smoking about 2 years ago. His smoking use included cigars. He has never used smokeless tobacco. He reports that he does not drink alcohol and does not use drugs.  ADVANCE DIRECTIVES:    CODE STATUS:   PAST MEDICAL HISTORY: Past Medical History:  Diagnosis Date   Acute heart failure (HCC) 04/08/2021   Acute HFrEF (heart failure with reduced ejection fraction) (HCC) 04/07/2021   Arthritis    hands, elbows bilaterally   Cancer (HCC)    Full dentures    Hypertension    Pt states he doen not have HTN and has never been treated for HTN   Kidney stone on left side last stone 4-5 yrs ago   recurrent (3 episodes)   Panic disorder    pt states has resolved   Syncope    resolved- was related to panic attacks    PAST SURGICAL HISTORY:  Past Surgical History:  Procedure Laterality Date   CARPAL METACARPAL FUSION WITH DISTAL RADIAL BONE GRAFT Left 05/30/2013   Procedure: LEFT THUMB METACARPAL JOINT FUSION;  Surgeon: [name], MD;  Location: [facility];  Service: Orthopedics;  Laterality: Left;   ESOPHAGOGASTRODUODENOSCOPY N/A 02/12/2014   Procedure: ESOPHAGOGASTRODUODENOSCOPY (EGD);  Surgeon: [name], MD;  Location: [facility] ENDOSCOPY;  Service: Endoscopy;  Laterality: N/A;   FOREIGN BODY REMOVAL N/A 02/12/2014   Procedure: FOREIGN BODY REMOVAL;  Surgeon: [name], MD;  Location: [facility]  ENDOSCOPY;  Service: Endoscopy;  Laterality: N/A;   OPEN REDUCTION INTERNAL FIXATION (ORIF) DISTAL RADIAL FRACTURE Left 11/29/2012   Procedure: OPEN REDUCTION INTERNAL FIXATION (ORIF) DISTAL RADIAL FRACTURE;  Surgeon: [name], MD;  Location: [facility];  Service: Orthopedics;  Laterality: Left;  LEFT DISTAL RADIUS OSTEOTOMY WITH BONE GRAFT   RIGHT/LEFT HEART CATH AND CORONARY ANGIOGRAPHY N/A 04/09/2021   Procedure: RIGHT/LEFT HEART CATH AND CORONARY ANGIOGRAPHY;  Surgeon: [name], MD;  Location: [facility];  Service: Cardiovascular;  Laterality: N/A;   TENDON REPAIR Left 02/07/2013   Procedure: LEFT EXTENSOR INDICIS PROPRIUS  TO EXTENSOR POLLICIS LONGUS TENDON TRANSFER;  Surgeon: [name], MD;  Location: [facility];  Service: Orthopedics;  Laterality: Left;   WISDOM TOOTH EXTRACTION     WRIST SURGERY     fx only    HEMATOLOGY/ONCOLOGY HISTORY:  Oncology History  Waldenstrom macroglobulinemia  05/24/2021 Initial Diagnosis   Waldenstrom macroglobulinemia (HCC)   06/05/2021 - 12/30/2021 Chemotherapy   Patient is on Treatment Plan : NON-HODGKINS LYMPHOMA Rituximab q7d       ALLERGIES:  is allergic to rituxan [rituximab] and hydrocodone.  MEDICATIONS:  Current Outpatient Medications  Medication Sig Dispense Refill   acetaminophen (TYLENOL) 500 MG tablet Take 500 mg by mouth every 6 (six) hours as needed. (Patient not taking: Reported on 01/10/2023)     albuterol (PROAIR HFA) 108 (90 Base) MCG/ACT inhaler Inhale 2 puffs into the lungs every 6 (six) hours as  needed for wheezing or shortness of breath. 6.7 g 2   carvedilol (COREG) 3.125 MG tablet Take 1 tablet by mouth 2 times daily. 60 tablet 6   chlorhexidine (HIBICLENS) 4 % external liquid Apply topically daily as needed. Dilute with water and clean wound with this.  Rinse thoroughly afterwards 237 mL 0   clobetasol cream (TEMOVATE) 0.05 % Apply 1 Application topically 2 (two)  times daily. Apply topically to the arms and hands twice daily for up to two weeks 30 g 2   dapagliflozin propanediol (FARXIGA) 10 MG TABS tablet Take 1 tablet (10 mg total) by mouth daily before breakfast. NEEDS FOLLOW UP APPOINTMENT FOR ANYMORE REFILLS 30 tablet 3   diazepam (VALIUM) 5 MG tablet Take 1 tablet (5 mg total) by mouth every 12 (twelve) hours as needed for anxiety. 60 tablet 0   dronabinol (MARINOL) 5 MG capsule Take 1 capsule (5 mg) by mouth 2 times daily before a meal. 60 capsule 0   eplerenone (INSPRA) 25 MG tablet Take 0.5 tablets (12.5 mg total) by mouth daily. NEEDS FOLLOW UP APPOINTMENT FOR MORE REFILLS 15 tablet 3   ferrous sulfate (FEROSUL) 325 (65 FE) MG tablet Take 1 tablet by mouth daily with breakfast. Please take with a source of Vitamin C 90 tablet 3   ibrutinib (IMBRUVICA) 420 MG tablet Take 1 tablet (420 mg total) by mouth daily. 28 tablet 2   methadone (DOLOPHINE) 10 MG tablet Take 1 tablet (10 mg total) by mouth every 8 (eight) hours. 90 tablet 0   naloxone (NARCAN) nasal spray 4 mg/0.1 mL Take for overdose 1 each 0   naloxone (NARCAN) nasal spray 4 mg/0.1 mL Take for overdose as directed 2 each 0   ondansetron (ZOFRAN) 8 MG tablet Take 1 tablet (8 mg) by mouth every 8 hours as needed. 30 tablet 0   Oxycodone HCl 10 MG TABS Take 1 tablet (10 mg total) by mouth every 4 (four) hours as needed. 90 tablet 0   potassium chloride SA (KLOR-CON M) 20 MEQ tablet Take 1 tablet (20 mEq total) by mouth daily. 30 tablet 1   sacubitril-valsartan (ENTRESTO) 24-26 MG Take 1 tablet by mouth 2 (two) times daily. 180 tablet 3   No current facility-administered medications for this visit.    VITAL SIGNS: There were no vitals taken for this visit. There were no vitals filed for this visit.     Estimated body mass index is 20.13 kg/m as calculated from the following:   Height as of 07/18/23: 5\' 8"  (1.727 m).   Weight as of 08/15/23: 132 lb 6.4 oz (60.1 kg).   PERFORMANCE STATUS  (ECOG) : 1 - Symptomatic but completely ambulatory  Assessment NAD RRR Normal breathing pattern  AAO X4  IMPRESSION: Connor Solomon present to clinic for symptom management follow-up. No acute distress. Denies nausea, vomiting, constipation, or diarrhea. Is trying to remain as active as possible. Tolerating all medications.   Neoplasm related pain Jerediah reports overall pain is well controlled. Some days are better than others.   We discussed regimen:Methadone 10mg  three times daily. Oxycodone 10mg  as needed for breakthrough pain. Taking as prescribed. Reill request within appropriate timing.   We will continue close monitoring and adjust as needed.  No changes to current regimen at this time.  Constipation Controlled with diet and daily MiraLAX.  Appetite/decreased appetite.  Much improved. Weight  132lbs up from 130lbs on 6/12, 129lbs on 5/17.  4. Anxiety/Anxiousness   5. Goals of care  7/10: I created space and opportunity for Mr. Tashjian to share his thoughts and feelings regarding his current health and condition. He is realistic speaking to his understanding of Christian faith. He is taking things one day at a time preparing himself for when his time comes. Until then he wishes to continue treating the treatable allowing himself every opportunity to thrive. He knows at anytime he can discuss with his medical team his wishes and focus on his comfort.   (5/16) Mr. Ivanova shares his realistic understanding of his condition. He and his wife are making sure all "affairs" are in order as his biggest worry is leaving his wife in a financial constraint. Reports communicating with his life insurance policy etc. Emotional support provided.    He is emotional expressing racing thoughts during idol time or interruptions in his sleep as his mind wonders thinking about worst case scenario. Is requesting something to assist with sleeping and anxiety. Acknowledged his request. Education provided on  limited use in collaboration with other medications including zyprexa. He verbalized understanding and aware  we will continue to support and monitor for needs.      PLAN: Methadone 10 mg every 8 hours. Close monitoring. Tolerating well. Pain controlled. Oxycodone 10 mg as needed for breakthrough pain.  Miralax daily for constipation Valium 5mg  at twice daily. Zofran as needed for nausea as prescribed.  I will plan to see patient back in 6-8 weeks in collaboration to other oncology appointments.   Patient expressed understanding and was in agreement with this plan. He also understands that He can call the clinic at any time with any questions, concerns, or complaints.    Any controlled substances utilized were prescribed in the context of palliative care. PDMP has been reviewed.    Visit consisted of counseling and education dealing with the complex and emotionally intense issues of symptom management and palliative care in the setting of serious and potentially life-threatening illness.Greater than 50%  of this time was spent counseling and coordinating care related to the above assessment and plan.  Willette Alma, AGPCNP-BC  Palliative Medicine Team/[facility]  *Please note that this is a verbal dictation therefore any spelling or grammatical errors are due to the "Dragon Medical One" system interpretation.

## 2023-09-11 ENCOUNTER — Other Ambulatory Visit: Payer: Self-pay | Admitting: Nurse Practitioner

## 2023-09-11 DIAGNOSIS — C8800 Waldenstrom macroglobulinemia not having achieved remission: Secondary | ICD-10-CM

## 2023-09-11 DIAGNOSIS — Z419 Encounter for procedure for purposes other than remedying health state, unspecified: Secondary | ICD-10-CM | POA: Diagnosis not present

## 2023-09-11 DIAGNOSIS — Z515 Encounter for palliative care: Secondary | ICD-10-CM

## 2023-09-11 DIAGNOSIS — G4700 Insomnia, unspecified: Secondary | ICD-10-CM

## 2023-09-11 DIAGNOSIS — G893 Neoplasm related pain (acute) (chronic): Secondary | ICD-10-CM

## 2023-09-11 NOTE — Progress Notes (Unsigned)
[facility] [phone]   [phone]  PROGRESS NOTE  Patient Care Team: [name], DO as PCP - General Pickenpack-Cousar, [name], NP as Nurse Practitioner (Nurse Practitioner)  Hematological/Oncological History # Waldenstrom's Macroglobulinemia # IgM Monoclonal Gammopathy 04/07/2021: CT A/p showed mildly enlarged retroperitoneal lymph nodes and bilateral inguinal lymph nodes.  04/08/2021: IgM Kappa M protein 1.2 04/09/2021: WBC 11.1, Hgb 9.5, MCV 75.4, Plt 525 04/20/2021: Kappa 977, Lambda 7.8, K/L ratio 125.28. Serum viscosity 1.8 05/06/2021: Establish care with Dr. 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 Infusion reaction with ritux, held halfway through.  06/12/2021: Cycle 2 Day 1 of Ibrutinib + Rituximab 06/19/2021: Cycle 3 Day 1 of Ibrutinib + Rituximab 06/26/2021: Cycle 4 Day 1 of rituximab. Continued on daily ibrutinib 12/02/2021: Cycle 5 Day 1 of Ibrutinib + Rituximab 12/09/2021: Cycle 6 Day 1 of Ibrutinib + Rituximab 12/16/2021: Cycle 7 Day 1 of Ibrutinib + Rituximab 12/23/2021: Held Cycle 8 of Ritxumab due to nausea/vomiting, elevated creatinine, finger infection  Interval History:  Connor Solomon 62 y.o. male with medical history significant for Waldenstrom's macroglobulinemia who presents for a follow up visit. The patient's last visit was on 08/15/23. In the interim since the last visit he has continued on Ibrutinib therapy.   On exam today Mr. Welsch reports is been doing well overall with interim since her last visit.  He reports that he is taking the dronabinol pills and gaining considerable amounts of weight.  He reports that he has been taking the pills for about 1 month and has noticed a big improvement in his appetite.  He reports that he is snacking  more often eating "chips and stuff like that".  He notes that he is having some increasing rash and redness of the skin as well as some lesions on his arms and back.  He reports that he was scheduled for an appoint with Dr. Onalee Hua but has not yet called to schedule a date.  He will do that today.  He reports that he does have his usual nausea with some occasional episodes of vomiting but reports that it is generally under control unless there is a smell that triggers his nausea.  He also reports that his pain is under good control right now.  His pain is about a 3 out of 10 at rest right now with the pain medication is provided by our palliative care service.  He has been applying lotion to his rash but reports it is still quite itchy and on occasion painful.  Also today he notes that he has been having some difficulty maintaining an erection.  He reports he is able to get an erection but he cannot keep it.  He is requesting consideration of Viagra medication.  Overall he is willing and able to proceed with ibrutinib therapy. He is tolerating ibrutinib therapy at this time.  He denies fevers, chills, night sweats, shortness of breath, chest pain or cough. He has no other complaints. Full 10 point ROS is listed below.   MEDICAL HISTORY:  Past Medical History:  Diagnosis Date   Acute heart failure (HCC) 04/08/2021   Acute HFrEF (heart failure with reduced ejection fraction) (HCC) 04/07/2021   Arthritis    hands, elbows bilaterally   Cancer (HCC)    Full dentures    Hypertension    Pt states he  doen not have HTN and has never been treated for HTN   Kidney stone on left side last stone 4-5 yrs ago   recurrent (3 episodes)   Panic disorder    pt states has resolved   Syncope    resolved- was related to panic attacks    SURGICAL HISTORY: Past Surgical History:  Procedure Laterality Date   CARPAL METACARPAL FUSION WITH DISTAL RADIAL BONE GRAFT Left 05/30/2013   Procedure: LEFT THUMB METACARPAL JOINT  FUSION;  Surgeon: [name], MD;  Location: [facility];  Service: Orthopedics;  Laterality: Left;   ESOPHAGOGASTRODUODENOSCOPY N/A 02/12/2014   Procedure: ESOPHAGOGASTRODUODENOSCOPY (EGD);  Surgeon: [name], MD;  Location: [facility] ENDOSCOPY;  Service: Endoscopy;  Laterality: N/A;   FOREIGN BODY REMOVAL N/A 02/12/2014   Procedure: FOREIGN BODY REMOVAL;  Surgeon: [name], MD;  Location: [facility] ENDOSCOPY;  Service: Endoscopy;  Laterality: N/A;   OPEN REDUCTION INTERNAL FIXATION (ORIF) DISTAL RADIAL FRACTURE Left 11/29/2012   Procedure: OPEN REDUCTION INTERNAL FIXATION (ORIF) DISTAL RADIAL FRACTURE;  Surgeon: [name], MD;  Location: [facility];  Service: Orthopedics;  Laterality: Left;  LEFT DISTAL RADIUS OSTEOTOMY WITH BONE GRAFT   RIGHT/LEFT HEART CATH AND CORONARY ANGIOGRAPHY N/A 04/09/2021   Procedure: RIGHT/LEFT HEART CATH AND CORONARY ANGIOGRAPHY;  Surgeon: [name], MD;  Location: [facility];  Service: Cardiovascular;  Laterality: N/A;   TENDON REPAIR Left 02/07/2013   Procedure: LEFT EXTENSOR INDICIS PROPRIUS  TO EXTENSOR POLLICIS LONGUS TENDON TRANSFER;  Surgeon: [name], MD;  Location: [facility];  Service: Orthopedics;  Laterality: Left;   WISDOM TOOTH EXTRACTION     WRIST SURGERY     fx only    SOCIAL HISTORY: Social History   Socioeconomic History   Marital status: Single    Spouse name: Not on file   Number of children: Not on file   Years of education: Not on file   Highest education level: Not on file  Occupational History   Not on file  Tobacco Use   Smoking status: Former    Types: Cigars    Quit date: 10/11/2020    Years since quitting: 2.9   Smokeless tobacco: Never   Tobacco comments:    smokes 1 cigar a week  Vaping Use   Vaping status: Never Used  Substance and Sexual Activity   Alcohol use: No   Drug use: No   Sexual activity: Not on file  Other Topics Concern   Not on  file  Social History Narrative   Not on file   Social Determinants of Health   Financial Resource Strain: High Risk (07/17/2021)   Overall Financial Resource Strain (CARDIA)    Difficulty of Paying Living Expenses: Very hard  Food Insecurity: Food Insecurity Present (04/10/2021)   Hunger Vital Sign    Worried About Running Out of Food in the Last Year: Sometimes true    Ran Out of Food in the Last Year: Sometimes true  Transportation Needs: No Transportation Needs (04/10/2021)   PRAPARE - Administrator, Civil Service (Medical): No    Lack of Transportation (Non-Medical): No  Physical Activity: Not on file  Stress: Stress Concern Present (09/01/2021)   [employer] of Occupational Health - Occupational Stress Questionnaire    Feeling of Stress : Very much  Social Connections: Not on file  Intimate Partner Violence: Not on file    FAMILY HISTORY: Family History  Problem  Relation Age of Onset   Heart attack Father    Sudden Cardiac Death Father 60   Diabetes Neg Hx    Hyperlipidemia Neg Hx    Hypertension Neg Hx     ALLERGIES:  is allergic to rituxan [rituximab] and hydrocodone.  MEDICATIONS:  Current Outpatient Medications  Medication Sig Dispense Refill   sildenafil (VIAGRA) 50 MG tablet Take 1 tablet (50 mg total) by mouth daily as needed for erectile dysfunction. 20 tablet 0   acetaminophen (TYLENOL) 500 MG tablet Take 500 mg by mouth every 6 (six) hours as needed. (Patient not taking: Reported on 01/10/2023)     albuterol (PROAIR HFA) 108 (90 Base) MCG/ACT inhaler Inhale 2 puffs into the lungs every 6 (six) hours as needed for wheezing or shortness of breath. 6.7 g 2   carvedilol (COREG) 3.125 MG tablet Take 1 tablet by mouth 2 times daily. 60 tablet 6   chlorhexidine (HIBICLENS) 4 % external liquid Apply topically daily as needed. Dilute with water and clean wound with this.  Rinse thoroughly afterwards 237 mL 0   clobetasol cream (TEMOVATE) 0.05 % Apply 1  Application topically 2 (two) times daily. Apply topically to the arms and hands twice daily for up to two weeks 30 g 2   dapagliflozin propanediol (FARXIGA) 10 MG TABS tablet Take 1 tablet (10 mg total) by mouth daily before breakfast. NEEDS FOLLOW UP APPOINTMENT FOR ANYMORE REFILLS 30 tablet 3   diazepam (VALIUM) 5 MG tablet Take 1 tablet (5 mg total) by mouth every 12 (twelve) hours as needed for anxiety. 60 tablet 0   [START ON 09/29/2023] dronabinol (MARINOL) 5 MG capsule Take 1 capsule (5 mg total) by mouth 2 (two) times daily before a meal. 60 capsule 0   eplerenone (INSPRA) 25 MG tablet Take 0.5 tablets (12.5 mg total) by mouth daily. NEEDS FOLLOW UP APPOINTMENT FOR MORE REFILLS 15 tablet 3   ferrous sulfate (FEROSUL) 325 (65 FE) MG tablet Take 1 tablet by mouth daily with breakfast. Please take with a source of Vitamin C 90 tablet 3   ibrutinib (IMBRUVICA) 420 MG tablet Take 1 tablet (420 mg total) by mouth daily. 28 tablet 2   [START ON 09/24/2023] methadone (DOLOPHINE) 10 MG tablet Take 1 tablet (10 mg total) by mouth every 8 (eight) hours. 90 tablet 0   naloxone (NARCAN) nasal spray 4 mg/0.1 mL Take for overdose 1 each 0   naloxone (NARCAN) nasal spray 4 mg/0.1 mL Take for overdose as directed 2 each 0   ondansetron (ZOFRAN) 8 MG tablet Take 1 tablet (8 mg) by mouth every 8 hours as needed. 30 tablet 0   [START ON 09/17/2023] Oxycodone HCl 10 MG TABS Take 1 tablet (10 mg total) by mouth every 4 (four) hours as needed. 90 tablet 0   potassium chloride SA (KLOR-CON M) 20 MEQ tablet Take 1 tablet (20 mEq total) by mouth daily. 30 tablet 1   sacubitril-valsartan (ENTRESTO) 24-26 MG Take 1 tablet by mouth 2 (two) times daily. 180 tablet 3   No current facility-administered medications for this visit.    REVIEW OF SYSTEMS:   Constitutional: ( - ) fevers, ( - )  chills , ( - ) night sweats Eyes: ( - ) blurriness of vision, ( - ) double vision, ( - ) watery eyes Ears, nose, mouth, throat, and  face: ( - ) mucositis, ( - ) sore throat Respiratory: ( - ) cough, ( - ) dyspnea, ( - )  wheezes Cardiovascular: ( - ) palpitation, ( - ) chest discomfort, ( - ) lower extremity swelling Gastrointestinal:  (- ) nausea, ( - ) heartburn, ( - ) change in bowel habits Skin: ( - ) abnormal skin rashes Lymphatics: ( - ) new lymphadenopathy, ( - ) easy bruising Neurological: ( - ) numbness, ( - ) tingling, ( - ) new weaknesses Behavioral/Psych: ( - ) mood change, ( - ) new changes  All other systems were reviewed with the patient and are negative.  PHYSICAL EXAMINATION: ECOG PERFORMANCE STATUS: 2 - Symptomatic, <50% confined to bed  Vitals:   09/12/23 1132  BP: 112/88  Pulse: 67  Resp: 14  Temp: 98.1 F (36.7 C)  SpO2: 100%   Filed Weights   09/12/23 1132  Weight: 142 lb 14.4 oz (64.8 kg)    GENERAL: Chronically ill appearing middle-aged Caucasian male, alert, no distress and comfortable SKIN: Dry cracking rash of hands bilaterally.  No issues on the chest, face, or lower extremities.  Skin color, texture, turgor are normal, no rashes or significant lesions.  EYES: conjunctiva are pink and non-injected, sclera clear LUNGS: clear to auscultation and percussion with normal breathing effort HEART: regular rate & rhythm and no murmurs and no lower extremity edema  MSK: Bilateral edema of hands.  PSYCH: alert & oriented x 3, fluent speech NEURO: no focal motor/sensory deficits   LABORATORY DATA:  I have reviewed the data as listed    Latest Ref Rng & Units 09/12/2023   10:58 AM 08/15/2023   11:37 AM 07/18/2023    2:13 PM  CBC  WBC 4.0 - 10.5 K/uL 2.7  4.7  2.3   Hemoglobin 13.0 - 17.0 g/dL 96.2  95.2  84.1   Hematocrit 39.0 - 52.0 % 36.7  42.8  37.0   Platelets 150 - 400 K/uL 208  168  197        Latest Ref Rng & Units 09/12/2023   10:58 AM 08/15/2023   11:37 AM 07/18/2023    2:13 PM  CMP  Glucose 70 - 99 mg/dL 324  86  89   BUN 8 - 23 mg/dL 11  10  13    Creatinine 0.61 - 1.24  mg/dL 4.01  0.27  2.53   Sodium 135 - 145 mmol/L 138  136  138   Potassium 3.5 - 5.1 mmol/L 3.5  4.3  4.1   Chloride 98 - 111 mmol/L 102  105  109   CO2 22 - 32 mmol/L 29  28  24    Calcium 8.9 - 10.3 mg/dL 8.8  9.2  8.8   Total Protein 6.5 - 8.1 g/dL 5.7  6.2  5.6   Total Bilirubin <1.2 mg/dL 0.6  0.6  0.5   Alkaline Phos 38 - 126 U/L 65  62  56   AST 15 - 41 U/L 18  14  13    ALT 0 - 44 U/L 12  9  8      Lab Results  Component Value Date   MPROTEIN 0.1 (H) 08/15/2023   MPROTEIN 0.1 (H) 07/18/2023   MPROTEIN 0.1 (H) 06/22/2023   Lab Results  Component Value Date   KPAFRELGTCHN 22.2 (H) 08/15/2023   KPAFRELGTCHN 27.5 (H) 07/18/2023   KPAFRELGTCHN 26.0 (H) 06/22/2023   LAMBDASER 4.7 (L) 08/15/2023   LAMBDASER 7.4 07/18/2023   LAMBDASER 6.7 06/22/2023   KAPLAMBRATIO 4.72 (H) 08/15/2023   KAPLAMBRATIO 3.72 (H) 07/18/2023   KAPLAMBRATIO 3.88 (H) 06/22/2023  RADIOGRAPHIC STUDIES: No results found.  ASSESSMENT & PLAN Connor Solomon 62 y.o. male with medical history significant for Waldenstrom's macroglobulinemia who presents for a follow up visit.   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  There is no clear evidence of hyperviscosity syndrome at this time.  At this time we will plan to proceed with ibrutinib and rituximab therapy.  One could also consider Bendamustine and rituximab therapy but given the patient's degree of anemia I would prefer pursuing ibrutinib and rituximab at this time.  Additionally ibrutinib/rituximab are category 1 recommendations per the NCCN guidelines.  The treatment will consist of ibrutinib 420 mg p.o. daily with rituximab on weeks 1 through 4 as well as weeks 27 through 30.  Previously we discussed the risks and benefits of therapy including (but not limited to) risk of bleeding, fatigue, atypical infections, and  cardiac arrhythmias.  The patient voices understanding of this plan moving forward.   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 --Started second round of weekly rituximab on 12/02/2021. Continued weekly x 4 weeks.   --last rituximab dose held due to missed visits/illness.  Plan: --Labs today were reviewed without any intervention needed.  Labs show white cell count 2.7, Hgb 12.2, MCV 85.7, Plt 208. Pending SPEP and sFLC levels but prior levels appear stable. --Currently on ibrutinib 420 mg PO daily.  --RTC in 4 weeks with labs   # Lesion on Back Of Head- resolved  -- Patient had a ping-pong ball sized lesion on the back of his head.  It appeared to be draining and has some dried blood and crusting. -- Patient evaluated by Dr. Onalee Hua in dermatology.  Lesion was biopsied and found not to be cancerous. -- Appears to be healing well and improving greatly.  Continue per Dr. Kermit Balo recommendations.  # Rash-improved markedly -- Affecting hands bilaterally, upper extremities bilaterally, and back.  Not affecting face, chest, or lower extremities. -- Patient has dryness and cracking as well as erythema on the upper extremities. -- Etiology is unclear.  Patient is using hydrating creams but is not effective.  Made referral to dermatology for evaluation, he is established with Dr. Langston Reusing.  # Hypokalemia-resolved -- Potassium 3.5 continue to monitor   #Pain Management --Pain was poorly controlled on hydrocodone therapy previously.  --Patient was previously admitted for for opioid overdose with presumed fentanyl --Currently pain regimen includes methadone 10 mg TID and oxycodone 10 mg PRN  for breakthrough pain per palliative care.  -- Appreciate recommendations of palliative care.  #Normocytic Anemia  --Hgb 12.2 --previously there was a component of iron deficiency anemia as well.  Currently on PO ferrous sulfate. --Continue to monitor.  #Nausea and vomiting--improved -- Concern that the nausea and vomiting may not be related to the ibrutinib therapy and potentially tied to poor gastric motility due to opioid prescription.  --Unable to tolerate olanzopine so discontinued --Not taking metoclopramide 10 mg every 8 hours  --continue zofran PRN.   #Fatigue #Anxiety/Depression: --Fatigue and depression have worsened recently --Anxiety has improved with Valium  --Will request further evaluation by palliative care team, scheduled for next visit today  #Supportive Care -- chemotherapy education  complete -- port placement not required   No orders of the defined types were placed in this encounter.  All questions were answered. The patient knows to call the clinic with any problems, questions or concerns.  I have spent a total of 30 minutes minutes of face-to-face and non-face-to-face time, preparing to see the patient,  performing a medically appropriate examination, counseling and educating the patient, ordering medications/tests, referring and communicating with other health care professionals, documenting clinical information in the electronic health record, and care coordination.   Ulysees Barns, MD Department of Hematology/Oncology [facility] Phone: [phone] Pager: [phone] Email: [email or web address]  09/12/2023 4:11 PM  Dimopoulos MA, Lennette Bihari, [place] Joycelyn Rua, Mahe B, Herbaux C, Tam C, Orsucci L, [place] ML, Matous JV, [place] C, Kastritis E, [place], Li J, Salman Z, Graef T, Buske C; iNNOVATE Study Group and the [employer]. Phase  3 Trial of Ibrutinib plus Rituximab in Waldenstrm's Macroglobulinemia. Macy Mis J Med. 2018 Jun 21;378(25):2399-2410.   --At 30 months, the progression-free survival rate was 82% with ibrutinib-rituximab versus 28% with placebo-rituximab (hazard ratio for progression or death, 0.20; P<0.001).

## 2023-09-12 ENCOUNTER — Inpatient Hospital Stay (HOSPITAL_BASED_OUTPATIENT_CLINIC_OR_DEPARTMENT_OTHER): Payer: Medicaid Other | Admitting: Hematology and Oncology

## 2023-09-12 ENCOUNTER — Inpatient Hospital Stay: Payer: Medicaid Other | Attending: Hematology and Oncology

## 2023-09-12 ENCOUNTER — Other Ambulatory Visit (HOSPITAL_COMMUNITY): Payer: Self-pay

## 2023-09-12 ENCOUNTER — Encounter: Payer: Self-pay | Admitting: Nurse Practitioner

## 2023-09-12 ENCOUNTER — Inpatient Hospital Stay (HOSPITAL_BASED_OUTPATIENT_CLINIC_OR_DEPARTMENT_OTHER): Payer: Medicaid Other | Admitting: Nurse Practitioner

## 2023-09-12 VITALS — BP 112/88 | HR 67 | Temp 98.1°F | Resp 14 | Wt 142.9 lb

## 2023-09-12 DIAGNOSIS — Z79620 Long term (current) use of immunosuppressive biologic: Secondary | ICD-10-CM | POA: Insufficient documentation

## 2023-09-12 DIAGNOSIS — D509 Iron deficiency anemia, unspecified: Secondary | ICD-10-CM | POA: Insufficient documentation

## 2023-09-12 DIAGNOSIS — Z8249 Family history of ischemic heart disease and other diseases of the circulatory system: Secondary | ICD-10-CM | POA: Diagnosis not present

## 2023-09-12 DIAGNOSIS — I110 Hypertensive heart disease with heart failure: Secondary | ICD-10-CM | POA: Diagnosis not present

## 2023-09-12 DIAGNOSIS — C8800 Waldenstrom macroglobulinemia not having achieved remission: Secondary | ICD-10-CM | POA: Insufficient documentation

## 2023-09-12 DIAGNOSIS — Z885 Allergy status to narcotic agent status: Secondary | ICD-10-CM | POA: Diagnosis not present

## 2023-09-12 DIAGNOSIS — K5900 Constipation, unspecified: Secondary | ICD-10-CM | POA: Insufficient documentation

## 2023-09-12 DIAGNOSIS — Z9221 Personal history of antineoplastic chemotherapy: Secondary | ICD-10-CM | POA: Diagnosis not present

## 2023-09-12 DIAGNOSIS — F17290 Nicotine dependence, other tobacco product, uncomplicated: Secondary | ICD-10-CM | POA: Insufficient documentation

## 2023-09-12 DIAGNOSIS — D472 Monoclonal gammopathy: Secondary | ICD-10-CM | POA: Diagnosis present

## 2023-09-12 DIAGNOSIS — R5383 Other fatigue: Secondary | ICD-10-CM | POA: Diagnosis not present

## 2023-09-12 DIAGNOSIS — Z8639 Personal history of other endocrine, nutritional and metabolic disease: Secondary | ICD-10-CM | POA: Diagnosis not present

## 2023-09-12 DIAGNOSIS — R634 Abnormal weight loss: Secondary | ICD-10-CM | POA: Diagnosis not present

## 2023-09-12 DIAGNOSIS — R21 Rash and other nonspecific skin eruption: Secondary | ICD-10-CM | POA: Insufficient documentation

## 2023-09-12 DIAGNOSIS — I5022 Chronic systolic (congestive) heart failure: Secondary | ICD-10-CM | POA: Diagnosis not present

## 2023-09-12 DIAGNOSIS — Z515 Encounter for palliative care: Secondary | ICD-10-CM | POA: Diagnosis not present

## 2023-09-12 DIAGNOSIS — Z87891 Personal history of nicotine dependence: Secondary | ICD-10-CM | POA: Diagnosis not present

## 2023-09-12 DIAGNOSIS — R059 Cough, unspecified: Secondary | ICD-10-CM | POA: Insufficient documentation

## 2023-09-12 DIAGNOSIS — R112 Nausea with vomiting, unspecified: Secondary | ICD-10-CM | POA: Diagnosis not present

## 2023-09-12 DIAGNOSIS — G893 Neoplasm related pain (acute) (chronic): Secondary | ICD-10-CM

## 2023-09-12 DIAGNOSIS — D649 Anemia, unspecified: Secondary | ICD-10-CM | POA: Diagnosis not present

## 2023-09-12 DIAGNOSIS — R630 Anorexia: Secondary | ICD-10-CM

## 2023-09-12 DIAGNOSIS — R590 Localized enlarged lymph nodes: Secondary | ICD-10-CM | POA: Diagnosis not present

## 2023-09-12 DIAGNOSIS — L539 Erythematous condition, unspecified: Secondary | ICD-10-CM | POA: Insufficient documentation

## 2023-09-12 DIAGNOSIS — Z79899 Other long term (current) drug therapy: Secondary | ICD-10-CM | POA: Diagnosis not present

## 2023-09-12 DIAGNOSIS — F32A Depression, unspecified: Secondary | ICD-10-CM | POA: Insufficient documentation

## 2023-09-12 DIAGNOSIS — Z5986 Financial insecurity: Secondary | ICD-10-CM | POA: Diagnosis not present

## 2023-09-12 LAB — CBC WITH DIFFERENTIAL (CANCER CENTER ONLY)
Abs Immature Granulocytes: 0 10*3/uL (ref 0.00–0.07)
Basophils Absolute: 0 10*3/uL (ref 0.0–0.1)
Basophils Relative: 1 %
Eosinophils Absolute: 0.4 10*3/uL (ref 0.0–0.5)
Eosinophils Relative: 15 %
HCT: 36.7 % — ABNORMAL LOW (ref 39.0–52.0)
Hemoglobin: 12.2 g/dL — ABNORMAL LOW (ref 13.0–17.0)
Immature Granulocytes: 0 %
Lymphocytes Relative: 24 %
Lymphs Abs: 0.6 10*3/uL — ABNORMAL LOW (ref 0.7–4.0)
MCH: 28.5 pg (ref 26.0–34.0)
MCHC: 33.2 g/dL (ref 30.0–36.0)
MCV: 85.7 fL (ref 80.0–100.0)
Monocytes Absolute: 0.6 10*3/uL (ref 0.1–1.0)
Monocytes Relative: 23 %
Neutro Abs: 1 10*3/uL — ABNORMAL LOW (ref 1.7–7.7)
Neutrophils Relative %: 37 %
Platelet Count: 208 10*3/uL (ref 150–400)
RBC: 4.28 MIL/uL (ref 4.22–5.81)
RDW: 13.6 % (ref 11.5–15.5)
WBC Count: 2.7 10*3/uL — ABNORMAL LOW (ref 4.0–10.5)
nRBC: 0 % (ref 0.0–0.2)

## 2023-09-12 LAB — CMP (CANCER CENTER ONLY)
ALT: 12 U/L (ref 0–44)
AST: 18 U/L (ref 15–41)
Albumin: 3.9 g/dL (ref 3.5–5.0)
Alkaline Phosphatase: 65 U/L (ref 38–126)
Anion gap: 7 (ref 5–15)
BUN: 11 mg/dL (ref 8–23)
CO2: 29 mmol/L (ref 22–32)
Calcium: 8.8 mg/dL — ABNORMAL LOW (ref 8.9–10.3)
Chloride: 102 mmol/L (ref 98–111)
Creatinine: 0.87 mg/dL (ref 0.61–1.24)
GFR, Estimated: >60 mL/min (ref >60)
Glucose, Bld: 103 mg/dL — ABNORMAL HIGH (ref 70–99)
Potassium: 3.5 mmol/L (ref 3.5–5.1)
Sodium: 138 mmol/L (ref 135–145)
Total Bilirubin: 0.6 mg/dL (ref <1.2)
Total Protein: 5.7 g/dL — ABNORMAL LOW (ref 6.5–8.1)

## 2023-09-12 LAB — LACTATE DEHYDROGENASE: LDH: 170 U/L (ref 98–192)

## 2023-09-12 MED ORDER — DRONABINOL 5 MG PO CAPS
5.0000 mg | ORAL_CAPSULE | Freq: Two times a day (BID) | ORAL | 0 refills | Status: DC
  Filled 2023-09-12: qty 60, 30d supply, fill #0

## 2023-09-12 MED ORDER — DIAZEPAM 5 MG PO TABS
5.0000 mg | ORAL_TABLET | Freq: Two times a day (BID) | ORAL | 0 refills | Status: DC | PRN
  Filled 2023-09-12: qty 60, 30d supply, fill #0

## 2023-09-12 MED ORDER — METHADONE HCL 10 MG PO TABS
10.0000 mg | ORAL_TABLET | Freq: Three times a day (TID) | ORAL | 0 refills | Status: DC
  Filled 2023-09-24: qty 90, 30d supply, fill #0

## 2023-09-12 MED ORDER — OXYCODONE HCL 10 MG PO TABS
10.0000 mg | ORAL_TABLET | ORAL | 0 refills | Status: DC | PRN
  Filled 2023-09-17 (×2): qty 90, 15d supply, fill #0

## 2023-09-12 MED ORDER — SILDENAFIL CITRATE 50 MG PO TABS
50.0000 mg | ORAL_TABLET | Freq: Every day | ORAL | 0 refills | Status: DC | PRN
Start: 2023-09-12 — End: 2023-11-04
  Filled 2023-09-12: qty 20, 20d supply, fill #0

## 2023-09-13 ENCOUNTER — Other Ambulatory Visit (HOSPITAL_COMMUNITY): Payer: Self-pay

## 2023-09-13 LAB — KAPPA/LAMBDA LIGHT CHAINS
Kappa free light chain: 29.3 mg/L — ABNORMAL HIGH (ref 3.3–19.4)
Kappa, lambda light chain ratio: 4.13 — ABNORMAL HIGH (ref 0.26–1.65)
Lambda free light chains: 7.1 mg/L (ref 5.7–26.3)

## 2023-09-15 LAB — MULTIPLE MYELOMA PANEL, SERUM
Albumin SerPl Elph-Mcnc: 3.4 g/dL (ref 2.9–4.4)
Albumin/Glob SerPl: 1.8 — ABNORMAL HIGH (ref 0.7–1.7)
Alpha 1: 0.3 g/dL (ref 0.0–0.4)
Alpha2 Glob SerPl Elph-Mcnc: 0.6 g/dL (ref 0.4–1.0)
B-Globulin SerPl Elph-Mcnc: 0.8 g/dL (ref 0.7–1.3)
Gamma Glob SerPl Elph-Mcnc: 0.4 g/dL (ref 0.4–1.8)
Globulin, Total: 2 g/dL — ABNORMAL LOW (ref 2.2–3.9)
IgA: 30 mg/dL — ABNORMAL LOW (ref 61–437)
IgG (Immunoglobin G), Serum: 353 mg/dL — ABNORMAL LOW (ref 603–1613)
IgM (Immunoglobulin M), Srm: 99 mg/dL (ref 20–172)
M Protein SerPl Elph-Mcnc: 0.1 g/dL — ABNORMAL HIGH
Total Protein ELP: 5.4 g/dL — ABNORMAL LOW (ref 6.0–8.5)

## 2023-09-16 ENCOUNTER — Telehealth: Payer: Self-pay

## 2023-09-16 ENCOUNTER — Other Ambulatory Visit (HOSPITAL_COMMUNITY): Payer: Self-pay

## 2023-09-16 NOTE — Telephone Encounter (Signed)
Pt called asking is he could have his medications filled early. RN educated pt on use of opioids and prescriptions, pt verbalized understanding. Pt reported sore on his legs/back/arms/feet/hands/ RN recommended pt go to urgent care to be further evaluated, pt verbalized understanding and agreement with plan.

## 2023-09-17 ENCOUNTER — Other Ambulatory Visit (HOSPITAL_COMMUNITY): Payer: Self-pay

## 2023-09-23 ENCOUNTER — Other Ambulatory Visit: Payer: Self-pay | Admitting: Nurse Practitioner

## 2023-09-23 ENCOUNTER — Other Ambulatory Visit (HOSPITAL_COMMUNITY): Payer: Self-pay

## 2023-09-23 DIAGNOSIS — Z515 Encounter for palliative care: Secondary | ICD-10-CM

## 2023-09-23 DIAGNOSIS — C8800 Waldenstrom macroglobulinemia not having achieved remission: Secondary | ICD-10-CM

## 2023-09-23 DIAGNOSIS — G893 Neoplasm related pain (acute) (chronic): Secondary | ICD-10-CM

## 2023-09-23 MED ORDER — OXYCODONE HCL 10 MG PO TABS
10.0000 mg | ORAL_TABLET | ORAL | 0 refills | Status: DC | PRN
  Filled 2023-09-27 (×2): qty 90, 15d supply, fill #0

## 2023-09-24 ENCOUNTER — Other Ambulatory Visit (HOSPITAL_COMMUNITY): Payer: Self-pay

## 2023-09-27 ENCOUNTER — Other Ambulatory Visit (HOSPITAL_COMMUNITY): Payer: Self-pay

## 2023-09-27 ENCOUNTER — Other Ambulatory Visit: Payer: Self-pay

## 2023-09-28 ENCOUNTER — Telehealth: Payer: Self-pay | Admitting: *Deleted

## 2023-09-28 NOTE — Telephone Encounter (Signed)
Received vm message from pt regarding his on going rash concerns. [name] patient and spoke with him. Per Dr. Ginny Forth needs to follow up with his dermatologist. Pt states he did call and has an appt in January 2025.  Pt has no other concerns at this time. His aware of his f/u visit here on 09/30/23

## 2023-10-06 ENCOUNTER — Other Ambulatory Visit: Payer: Self-pay | Admitting: Nurse Practitioner

## 2023-10-06 ENCOUNTER — Other Ambulatory Visit (HOSPITAL_COMMUNITY): Payer: Self-pay

## 2023-10-06 DIAGNOSIS — C8800 Waldenstrom macroglobulinemia not having achieved remission: Secondary | ICD-10-CM

## 2023-10-06 DIAGNOSIS — Z515 Encounter for palliative care: Secondary | ICD-10-CM

## 2023-10-06 DIAGNOSIS — G893 Neoplasm related pain (acute) (chronic): Secondary | ICD-10-CM

## 2023-10-06 MED ORDER — OXYCODONE HCL 10 MG PO TABS
10.0000 mg | ORAL_TABLET | ORAL | 0 refills | Status: DC | PRN
  Filled 2023-10-10: qty 90, 15d supply, fill #0

## 2023-10-06 NOTE — Progress Notes (Signed)
Palliative Medicine [facility]  [phone] [phone]) [phone]   Name: Connor Solomon Date: 10/06/2023 MRN: [id number]  DOB: 03-15-1961  Patient Care Team: [name], DO as PCP - General Pickenpack-Cousar, [name], NP as Nurse Practitioner (Nurse Practitioner)   REASON FOR CONSULTATION: Connor Solomon is a 62 y.o. 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 Palliative ask to see for symptom management.  SOCIAL HISTORY:     reports that he quit smoking about 2 years ago. His smoking use included cigars. He has never used smokeless tobacco. He reports that he does not drink alcohol and does not use drugs.  ADVANCE DIRECTIVES:    CODE STATUS:   PAST MEDICAL HISTORY: Past Medical History:  Diagnosis Date   Acute heart failure (HCC) 04/08/2021   Acute HFrEF (heart failure with reduced ejection fraction) (HCC) 04/07/2021   Arthritis    hands, elbows bilaterally   Cancer (HCC)    Full dentures    Hypertension    Pt states he doen not have HTN and has never been treated for HTN   Kidney stone on left side last stone 4-5 yrs ago   recurrent (3 episodes)   Panic disorder    pt states has resolved   Syncope    resolved- was related to panic attacks    PAST SURGICAL HISTORY:  Past Surgical History:  Procedure Laterality Date   CARPAL METACARPAL FUSION WITH DISTAL RADIAL BONE GRAFT Left 05/30/2013   Procedure: LEFT THUMB METACARPAL JOINT FUSION;  Surgeon: [name], MD;  Location: [facility];  Service: Orthopedics;  Laterality: Left;   ESOPHAGOGASTRODUODENOSCOPY N/A 02/12/2014   Procedure: ESOPHAGOGASTRODUODENOSCOPY (EGD);  Surgeon: [name], MD;  Location: [facility];  Service: Endoscopy;  Laterality: N/A;   FOREIGN BODY REMOVAL N/A 02/12/2014   Procedure: FOREIGN BODY REMOVAL;  Surgeon: [name], MD;  Location: [facility]  ENDOSCOPY;  Service: Endoscopy;  Laterality: N/A;   OPEN REDUCTION INTERNAL FIXATION (ORIF) DISTAL RADIAL FRACTURE Left 11/29/2012   Procedure: OPEN REDUCTION INTERNAL FIXATION (ORIF) DISTAL RADIAL FRACTURE;  Surgeon: [name], MD;  Location: [facility];  Service: Orthopedics;  Laterality: Left;  LEFT DISTAL RADIUS OSTEOTOMY WITH BONE GRAFT   RIGHT/LEFT HEART CATH AND CORONARY ANGIOGRAPHY N/A 04/09/2021   Procedure: RIGHT/LEFT HEART CATH AND CORONARY ANGIOGRAPHY;  Surgeon: [name], MD;  Location: [facility];  Service: Cardiovascular;  Laterality: N/A;   TENDON REPAIR Left 02/07/2013   Procedure: LEFT EXTENSOR INDICIS PROPRIUS  TO EXTENSOR POLLICIS LONGUS TENDON TRANSFER;  Surgeon: [name], MD;  Location: [facility];  Service: Orthopedics;  Laterality: Left;   WISDOM TOOTH EXTRACTION     WRIST SURGERY     fx only    HEMATOLOGY/ONCOLOGY HISTORY:  Oncology History  Waldenstrom macroglobulinemia  05/24/2021 Initial Diagnosis   Waldenstrom macroglobulinemia (HCC)   06/05/2021 - 12/30/2021 Chemotherapy   Patient is on Treatment Plan : NON-HODGKINS LYMPHOMA Rituximab q7d       ALLERGIES:  is allergic to rituxan [rituximab] and hydrocodone.  MEDICATIONS:  Current Outpatient Medications  Medication Sig Dispense Refill   acetaminophen (TYLENOL) 500 MG tablet Take 500 mg by mouth every 6 (six) hours as needed. (Patient not taking: Reported on 01/10/2023)     albuterol (PROAIR HFA) 108 (90 Base) MCG/ACT inhaler Inhale 2 puffs into the lungs every 6 (six) hours as  needed for wheezing or shortness of breath. 6.7 g 2   carvedilol (COREG) 3.125 MG tablet Take 1 tablet by mouth 2 times daily. 60 tablet 6   chlorhexidine (HIBICLENS) 4 % external liquid Apply topically daily as needed. Dilute with water and clean wound with this.  Rinse thoroughly afterwards 237 mL 0   clobetasol cream (TEMOVATE) 0.05 % Apply 1 Application topically 2 (two)  times daily. Apply topically to the arms and hands twice daily for up to two weeks 30 g 2   dapagliflozin propanediol (FARXIGA) 10 MG TABS tablet Take 1 tablet (10 mg total) by mouth daily before breakfast. NEEDS FOLLOW UP APPOINTMENT FOR ANYMORE REFILLS 30 tablet 3   diazepam (VALIUM) 5 MG tablet Take 1 tablet (5 mg total) by mouth every 12 (twelve) hours as needed for anxiety. 60 tablet 0   dronabinol (MARINOL) 5 MG capsule Take 1 capsule (5 mg total) by mouth 2 (two) times daily before a meal. 60 capsule 0   eplerenone (INSPRA) 25 MG tablet Take 0.5 tablets (12.5 mg total) by mouth daily. NEEDS FOLLOW UP APPOINTMENT FOR MORE REFILLS 15 tablet 3   ferrous sulfate (FEROSUL) 325 (65 FE) MG tablet Take 1 tablet by mouth daily with breakfast. Please take with a source of Vitamin C 90 tablet 3   ibrutinib (IMBRUVICA) 420 MG tablet Take 1 tablet (420 mg total) by mouth daily. 28 tablet 2   methadone (DOLOPHINE) 10 MG tablet Take 1 tablet (10 mg total) by mouth every 8 (eight) hours. 90 tablet 0   naloxone (NARCAN) nasal spray 4 mg/0.1 mL Take for overdose 1 each 0   naloxone (NARCAN) nasal spray 4 mg/0.1 mL Take for overdose as directed 2 each 0   ondansetron (ZOFRAN) 8 MG tablet Take 1 tablet (8 mg) by mouth every 8 hours as needed. 30 tablet 0   Oxycodone HCl 10 MG TABS Take 1 tablet (10 mg total) by mouth every 4 (four) hours as needed. 90 tablet 0   potassium chloride SA (KLOR-CON M) 20 MEQ tablet Take 1 tablet (20 mEq total) by mouth daily. 30 tablet 1   sacubitril-valsartan (ENTRESTO) 24-26 MG Take 1 tablet by mouth 2 (two) times daily. 180 tablet 3   sildenafil (VIAGRA) 50 MG tablet Take 1 tablet (50 mg total) by mouth daily as needed for erectile dysfunction. 20 tablet 0   No current facility-administered medications for this visit.    VITAL SIGNS: There were no vitals taken for this visit. There were no vitals filed for this visit.     Estimated body mass index is 21.73 kg/m as  calculated from the following:   Height as of 07/18/23: 5\' 8"  (1.727 m).   Weight as of 09/12/23: 142 lb 14.4 oz (64.8 kg).   PERFORMANCE STATUS (ECOG) : 1 - Symptomatic but completely ambulatory  Assessment NAD RRR Normal breathing pattern  Skin rash/lesions noted to abdomen/chest wall. Some scabbed  AAO X4  IMPRESSION:  Connor Solomon presented to clinic for follow-up. No acute distress. Denies nausea, vomiting, constipation, or diarrhea. Continues to take things one day at a time. He shares recently recovering from a cold after caring for his grandchildren. Connor Solomon continues to express challenges including discomfort to  areas noted to have pustules. The patient has been experiencing skin lesions that start small but grow and split open, causing significant discomfort. These lesions are primarily located on the patient's stomach, sides, and front of the legs, with some on the  ankles and feet. This condition has been present for approximately a month. Confirms he has an upcoming Dermatology appointment to hopefully address his concerns.   The patient reports overall his pain is well controlled on regimen. He did have to use a few extra pills due to discomfort related to his ongoing dermatological issues. We discussed importance of adhering to regimen again with hopes of some relief in acute symptoms after upcoming Dermatology visit. Education provided on criteria for medication adjustments specific to cancer related pain and use of long-acting versus short-acting regimen. He verbalized understanding. No changes to current regimen at this time.    We will continue to closely monitor.   Goals of care   7/10: I created space and opportunity for Connor Solomon to share his thoughts and feelings regarding his current health and condition. He is realistic speaking to his understanding of Christian faith. He is taking things one day at a time preparing himself for when his time comes. Until then he wishes  to continue treating the treatable allowing himself every opportunity to thrive. He knows at anytime he can discuss with his medical team his wishes and focus on his comfort.   (5/16) Connor Solomon shares his realistic understanding of his condition. He and his wife are making sure all "affairs" are in order as his biggest worry is leaving his wife in a financial constraint. Reports communicating with his life insurance policy etc. Emotional support provided.    He is emotional expressing racing thoughts during idol time or interruptions in his sleep as his mind wonders thinking about worst case scenario. Is requesting something to assist with sleeping and anxiety. Acknowledged his request. Education provided on limited use in collaboration with other medications including zyprexa. He verbalized understanding and aware  we will continue to support and monitor for needs.      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 Patient reports needing to take several additional doses of Oxycodone due to increased pain related to dermatological issues. Discussed the need to adjust long-acting Methadone before increasing short-acting Oxycodone per guidelines. -Continue current regimen of Methadone 10 mg every 8 hours -Oxycodone 10mg  every 4 hours as needed for breakthrough pain.  -Consider increasing Methadone dose if pain persists as it relates to cancer.    Skin Condition Patient reports new skin lesions on stomach, sides, and legs. Lesions are painful and split open. Patient has an upcoming appointment with Dermatologist Connor Solomon on 10/27/2023. -Continue current skin care regimen. -Follow up with Dermatologist Connor Solomon on 10/27/2023. -Consider Dermatologist's recommendations for treatment after appointment.   I will plan to follow-up with patient in 6-8 weeks. He knows to contact office sooner if needed.Methadone 10 mg every 8 hours. Close  monitoring. Tolerating well. Pain controlled.  Patient expressed understanding and was in agreement with this plan. He also understands that He can call the clinic at any time with any questions, concerns, or complaints.    Any controlled substances utilized were prescribed in the context of palliative care. PDMP has been reviewed.    Visit consisted of counseling and education dealing with the complex and emotionally intense issues of symptom management and palliative care in the setting of serious and potentially life-threatening illness.  Willette Alma, AGPCNP-BC  Palliative Medicine Team/[facility]

## 2023-10-09 NOTE — Progress Notes (Unsigned)
[facility] [phone]   [phone]  PROGRESS NOTE  Patient Care Team: Connor Rogue, DO as PCP - General Connor Solomon, Connor Baumgartner, NP as Nurse Practitioner (Nurse Practitioner)  Hematological/Oncological History # Waldenstrom's Macroglobulinemia # IgM Monoclonal Gammopathy 04/07/2021: CT A/p showed mildly enlarged retroperitoneal lymph nodes and bilateral inguinal lymph nodes.  04/08/2021: IgM Kappa M protein 1.2 04/09/2021: WBC 11.1, Hgb 9.5, MCV 75.4, Plt 525 04/20/2021: Kappa 977, Lambda 7.8, K/L ratio 125.28. Serum viscosity 1.8 05/06/2021: Establish care with Dr. Leonides Solomon 05/19/2021: Bone marrow biopsy performed showed hypercellular bone marrow involved by non-Hodgkin B-cell lymphoma, findings most consistent with Waldenstrom's macroglobulinemia 06/05/2021: Cycle 1 Day 1 of Ibrutinib + Rituximab. Infusion reaction with ritux, held halfway through.  06/12/2021: Cycle 2 Day 1 of Ibrutinib + Rituximab 06/19/2021: Cycle 3 Day 1 of Ibrutinib + Rituximab 06/26/2021: Cycle 4 Day 1 of rituximab. Continued on daily ibrutinib 12/02/2021: Cycle 5 Day 1 of Ibrutinib + Rituximab 12/09/2021: Cycle 6 Day 1 of Ibrutinib + Rituximab 12/16/2021: Cycle 7 Day 1 of Ibrutinib + Rituximab 12/23/2021: Held Cycle 8 of Ritxumab due to nausea/vomiting, elevated creatinine, finger infection  Interval History:  Connor Solomon 62 y.o. male with medical history significant for Waldenstrom's macroglobulinemia who presents for a follow up visit. The patient's last visit was on 09/12/2023. In the interim since the last visit he has continued on Ibrutinib therapy.   On exam today Connor Solomon reports ***  Overall he is willing and able to proceed with ibrutinib therapy. He is tolerating ibrutinib therapy at this time.  He denies fevers, chills, night sweats, shortness of breath, chest pain or cough. He has no other complaints. Full 10 point ROS is listed below.   MEDICAL HISTORY:  Past Medical  History:  Diagnosis Date   Acute heart failure (HCC) 04/08/2021   Acute HFrEF (heart failure with reduced ejection fraction) (HCC) 04/07/2021   Arthritis    hands, elbows bilaterally   Cancer (HCC)    Full dentures    Hypertension    Pt states he doen not have HTN and has never been treated for HTN   Kidney stone on left side last stone 4-5 yrs ago   recurrent (3 episodes)   Panic disorder    pt states has resolved   Syncope    resolved- was related to panic attacks    SURGICAL HISTORY: Past Surgical History:  Procedure Laterality Date   CARPAL METACARPAL FUSION WITH DISTAL RADIAL BONE GRAFT Left 05/30/2013   Procedure: LEFT THUMB METACARPAL JOINT FUSION;  Surgeon: Connor Shores, MD;  Location: [facility];  Service: Orthopedics;  Laterality: Left;   ESOPHAGOGASTRODUODENOSCOPY N/A 02/12/2014   Procedure: ESOPHAGOGASTRODUODENOSCOPY (EGD);  Surgeon: Connor Fiedler, MD;  Location: [facility] END[facility]COPY;  Service: Endoscopy;  Laterality: N/A;   FOREIGN BODY REMOVAL N/A 02/12/2014   Procedure: FOREIGN BODY REMOVAL;  Surgeon: Connor Fiedler, MD;  Location: [facility] END[facility]COPY;  Service: Endoscopy;  Laterality: N/A;   OPEN REDUCTION INTERNAL FIXATION (ORIF) DISTAL RADIAL FRACTURE Left 11/29/2012   Procedure: OPEN REDUCTION INTERNAL FIXATION (ORIF) DISTAL RADIAL FRACTURE;  Surgeon: Connor Shores, MD;  Location: [facility];  Service: Orthopedics;  Laterality: Left;  LEFT DISTAL RADIUS OSTEOTOMY WITH BONE GRAFT   RIGHT/LEFT HEART CATH AND CORONARY ANGIOGRAPHY N/A 04/09/2021   Procedure: RIGHT/LEFT HEART CATH AND CORONARY ANGIOGRAPHY;  Surgeon: Connor Patty, MD;  Location: [facility];  Service: Cardiovascular;  Laterality: N/A;   TENDON REPAIR  Left 02/07/2013   Procedure: LEFT EXTENSOR INDICIS PROPRIUS  TO EXTENSOR POLLICIS LONGUS TENDON TRANSFER;  Surgeon: Connor Shores, MD;  Location: [facility];  Service: Orthopedics;  Laterality: Left;   WISDOM  TOOTH EXTRACTION     WRIST SURGERY     fx only    SOCIAL HISTORY: Social History   Socioeconomic History   Marital status: Single    Spouse name: Not on file   Number of children: Not on file   Years of education: Not on file   Highest education level: Not on file  Occupational History   Not on file  Tobacco Use   Smoking status: Former    Types: Cigars    Quit date: 10/11/2020    Years since quitting: 2.9   Smokeless tobacco: Never   Tobacco comments:    smokes 1 cigar a week  Vaping Use   Vaping status: Never Used  Substance and Sexual Activity   Alcohol use: No   Drug use: No   Sexual activity: Not on file  Other Topics Concern   Not on file  Social History Narrative   Not on file   Social Drivers of Health   Financial Resource Strain: High Risk (07/17/2021)   Overall Financial Resource Strain (CARDIA)    Difficulty of Paying Living Expenses: Very hard  Food Insecurity: Food Insecurity Present (04/10/2021)   Hunger Vital Sign    Worried About Running Out of Food in the Last Year: Sometimes true    Ran Out of Food in the Last Year: Sometimes true  Transportation Needs: No Transportation Needs (04/10/2021)   PRAPARE - Administrator, Civil Service (Medical): No    Lack of Transportation (Non-Medical): No  Physical Activity: Not on file  Stress: Stress Concern Present (09/01/2021)   [employer] of Occupational Health - Occupational Stress Questionnaire    Feeling of Stress : Very much  Social Connections: Not on file  Intimate Partner Violence: Not on file    FAMILY HISTORY: Family History  Problem Relation Age of Onset   Heart attack Father    Sudden Cardiac Death Father 89   Diabetes Neg Hx    Hyperlipidemia Neg Hx    Hypertension Neg Hx     ALLERGIES:  is allergic to rituxan [rituximab] and hydrocodone.  MEDICATIONS:  Current Outpatient Medications  Medication Sig Dispense Refill   acetaminophen (TYLENOL) 500 MG tablet Take 500 mg by  mouth every 6 (six) hours as needed. (Patient not taking: Reported on 01/10/2023)     albuterol (PROAIR HFA) 108 (90 Base) MCG/ACT inhaler Inhale 2 puffs into the lungs every 6 (six) hours as needed for wheezing or shortness of breath. 6.7 g 2   carvedilol (COREG) 3.125 MG tablet Take 1 tablet by mouth 2 times daily. 60 tablet 6   chlorhexidine (HIBICLENS) 4 % external liquid Apply topically daily as needed. Dilute with water and clean wound with this.  Rinse thoroughly afterwards 237 mL 0   clobetasol cream (TEMOVATE) 0.05 % Apply 1 Application topically 2 (two) times daily. Apply topically to the arms and hands twice daily for up to two weeks 30 g 2   dapagliflozin propanediol (FARXIGA) 10 MG TABS tablet Take 1 tablet (10 mg total) by mouth daily before breakfast. NEEDS FOLLOW UP APPOINTMENT FOR ANYMORE REFILLS 30 tablet 3   diazepam (VALIUM) 5 MG tablet Take 1 tablet (5 mg total) by mouth every 12 (twelve) hours as needed for anxiety.  60 tablet 0   dronabinol (MARINOL) 5 MG capsule Take 1 capsule (5 mg total) by mouth 2 (two) times daily before a meal. 60 capsule 0   eplerenone (INSPRA) 25 MG tablet Take 0.5 tablets (12.5 mg total) by mouth daily. NEEDS FOLLOW UP APPOINTMENT FOR MORE REFILLS 15 tablet 3   ferrous sulfate (FEROSUL) 325 (65 FE) MG tablet Take 1 tablet by mouth daily with breakfast. Please take with a source of Vitamin C 90 tablet 3   ibrutinib (IMBRUVICA) 420 MG tablet Take 1 tablet (420 mg total) by mouth daily. 28 tablet 2   methadone (DOLOPHINE) 10 MG tablet Take 1 tablet (10 mg total) by mouth every 8 (eight) hours. 90 tablet 0   naloxone (NARCAN) nasal spray 4 mg/0.1 mL Take for overdose 1 each 0   naloxone (NARCAN) nasal spray 4 mg/0.1 mL Take for overdose as directed 2 each 0   ondansetron (ZOFRAN) 8 MG tablet Take 1 tablet (8 mg) by mouth every 8 hours as needed. 30 tablet 0   [START ON 10/10/2023] Oxycodone HCl 10 MG TABS Take 1 tablet (10 mg total) by mouth every 4 (four)  hours as needed. 90 tablet 0   potassium chloride SA (KLOR-CON M) 20 MEQ tablet Take 1 tablet (20 mEq total) by mouth daily. 30 tablet 1   sacubitril-valsartan (ENTRESTO) 24-26 MG Take 1 tablet by mouth 2 (two) times daily. 180 tablet 3   sildenafil (VIAGRA) 50 MG tablet Take 1 tablet (50 mg total) by mouth daily as needed for erectile dysfunction. 20 tablet 0   No current facility-administered medications for this visit.    REVIEW OF SYSTEMS:   Constitutional: ( - ) fevers, ( - )  chills , ( - ) night sweats Eyes: ( - ) blurriness of vision, ( - ) double vision, ( - ) watery eyes Ears, nose, mouth, throat, and face: ( - ) mucositis, ( - ) sore throat Respiratory: ( - ) cough, ( - ) dyspnea, ( - ) wheezes Cardiovascular: ( - ) palpitation, ( - ) chest discomfort, ( - ) lower extremity swelling Gastrointestinal:  (- ) nausea, ( - ) heartburn, ( - ) change in bowel habits Skin: ( - ) abnormal skin rashes Lymphatics: ( - ) new lymphadenopathy, ( - ) easy bruising Neurological: ( - ) numbness, ( - ) tingling, ( - ) new weaknesses Behavioral/Psych: ( - ) mood change, ( - ) new changes  All other systems were reviewed with the patient and are negative.  PHYSICAL EXAMINATION: ECOG PERFORMANCE STATUS: 2 - Symptomatic, <50% confined to bed  There were no vitals filed for this visit.  There were no vitals filed for this visit.   GENERAL: Chronically ill appearing middle-aged Caucasian male, alert, no distress and comfortable SKIN: Dry cracking rash of hands bilaterally.  No issues on the chest, face, or lower extremities.  Skin color, texture, turgor are normal, no rashes or significant lesions.  EYES: conjunctiva are pink and non-injected, sclera clear LUNGS: clear to auscultation and percussion with normal breathing effort HEART: regular rate & rhythm and no murmurs and no lower extremity edema  MSK: Bilateral edema of hands.  PSYCH: alert & oriented x 3, fluent speech NEURO: no focal  motor/sensory deficits   LABORATORY DATA:  I have reviewed the data as listed    Latest Ref Rng & Units 09/12/2023   10:58 AM 08/15/2023   11:37 AM 07/18/2023    2:13 PM  CBC  WBC 4.0 - 10.5 K/uL 2.7  4.7  2.3   Hemoglobin 13.0 - 17.0 g/dL 86.5  78.4  69.6   Hematocrit 39.0 - 52.0 % 36.7  42.8  37.0   Platelets 150 - 400 K/uL 208  168  197        Latest Ref Rng & Units 09/12/2023   10:58 AM 08/15/2023   11:37 AM 07/18/2023    2:13 PM  CMP  Glucose 70 - 99 mg/dL 295  86  89   BUN 8 - 23 mg/dL 11  10  13    Creatinine 0.61 - 1.24 mg/dL 2.84  1.32  4.40   Sodium 135 - 145 mmol/L 138  136  138   Potassium 3.5 - 5.1 mmol/L 3.5  4.3  4.1   Chloride 98 - 111 mmol/L 102  105  109   CO2 22 - 32 mmol/L 29  28  24    Calcium 8.9 - 10.3 mg/dL 8.8  9.2  8.8   Total Protein 6.5 - 8.1 g/dL 5.7  6.2  5.6   Total Bilirubin <1.2 mg/dL 0.6  0.6  0.5   Alkaline Phos 38 - 126 U/L 65  62  56   AST 15 - 41 U/L 18  14  13    ALT 0 - 44 U/L 12  9  8      Lab Results  Component Value Date   MPROTEIN 0.1 (H) 09/12/2023   MPROTEIN 0.1 (H) 08/15/2023   MPROTEIN 0.1 (H) 07/18/2023   Lab Results  Component Value Date   KPAFRELGTCHN 29.3 (H) 09/12/2023   KPAFRELGTCHN 22.2 (H) 08/15/2023   KPAFRELGTCHN 27.5 (H) 07/18/2023   LAMBDASER 7.1 09/12/2023   LAMBDASER 4.7 (L) 08/15/2023   LAMBDASER 7.4 07/18/2023   KAPLAMBRATIO 4.13 (H) 09/12/2023   KAPLAMBRATIO 4.72 (H) 08/15/2023   KAPLAMBRATIO 3.72 (H) 07/18/2023    RADIOGRAPHIC STUDIES: No results found.  ASSESSMENT & PLAN REIDEN TULK 62 y.o. male with medical history significant for Waldenstrom's macroglobulinemia who presents for a follow up visit.   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  There is no clear evidence of hyperviscosity syndrome at this time.  At this time we will plan to proceed with  ibrutinib and rituximab therapy.  One could also consider Bendamustine and rituximab therapy but given the patient's degree of anemia I would prefer pursuing ibrutinib and rituximab at this time.  Additionally ibrutinib/rituximab are category 1 recommendations per the NCCN guidelines.  The treatment will consist of ibrutinib 420 mg p.o. daily with rituximab on weeks 1 through 4 as well as weeks 27 through 30.  Previously we discussed the risks and benefits of therapy including (but not limited to) risk of bleeding, fatigue, atypical infections, and cardiac arrhythmias.  The patient voices understanding of this plan moving forward.   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 --Started second round of weekly rituximab on 12/02/2021. Continued weekly x 4 weeks.   --last rituximab dose held due to missed visits/illness.  Plan: --Labs today were reviewed without any intervention needed.  Labs show white cell count ***. Pending SPEP and sFLC levels but prior levels appear stable. --  Currently on ibrutinib 420 mg PO daily.  --RTC in 4 weeks with labs   # Lesion on Back Of Head- resolved  -- Patient had a ping-pong ball sized lesion on the back of his head.  It appeared to be draining and has some dried blood and crusting. -- Patient evaluated by Dr. Onalee Hua in dermatology.  Lesion was biopsied and found not to be cancerous. -- Appears to be healing well and improving greatly.  Continue per Dr. Kermit Balo recommendations.  # Rash-improved markedly -- Affecting hands bilaterally, upper extremities bilaterally, and back.  Not affecting face, chest, or lower extremities. -- Patient has dryness and cracking as  well as erythema on the upper extremities. -- Etiology is unclear.  Patient is using hydrating creams but is not effective.  Made referral to dermatology for evaluation, he is established with Dr. Langston Reusing.  # Hypokalemia-resolved -- Potassium 3.5 continue to monitor   #Pain Management --Pain was poorly controlled on hydrocodone therapy previously.  --Patient was previously admitted for for opioid overdose with presumed fentanyl --Currently pain regimen includes methadone 10 mg TID and oxycodone 10 mg PRN for breakthrough pain per palliative care.  -- Appreciate recommendations of palliative care.  #Normocytic Anemia  --Hgb 12.2 --previously there was a component of iron deficiency anemia as well.  Currently on PO ferrous sulfate. --Continue to monitor.  #Nausea and vomiting--improved -- Concern that the nausea and vomiting may not be related to the ibrutinib therapy and potentially tied to poor gastric motility due to opioid prescription.  --Unable to tolerate olanzopine so discontinued --Not taking metoclopramide 10 mg every 8 hours  --continue zofran PRN.   #Fatigue #Anxiety/Depression: --Fatigue and depression have worsened recently --Anxiety has improved with Valium  --Will request further evaluation by palliative care team, scheduled for next visit today  #Supportive Care -- chemotherapy education complete -- port placement not required   No orders of the defined types were placed in this encounter.  All questions were answered. The patient knows to call the clinic with any problems, questions or concerns.  I have spent a total of 30 minutes minutes of face-to-face and non-face-to-face time, preparing to see the patient,  performing a medically appropriate examination, counseling and educating the patient, ordering medications/tests, referring and communicating with other health care professionals, documenting clinical information in the electronic health record, and  care coordination.   Ulysees Barns, MD Department of Hematology/Oncology [facility] Phone: [phone] Pager: [phone] Email: [email or web address]  10/09/2023 6:57 PM  Dimopoulos MA, Lennette Bihari, Trotman Joycelyn Rua, Mahe B, Herbaux C, Tam C, Orsucci L, Palomba ML, Matous JV, Rainier C, Kastritis E, [place], Li J, Salman Z, Graef T, Buske C; iNN[employer] Study Group and the [employer] for [employer]. Phase 3 Trial of Ibrutinib plus Rituximab in Waldenstrm's Macroglobulinemia. Macy Mis J Med. 2018 Jun 21;378(25):2399-2410.   --At 30 months, the progression-free survival rate was 82% with ibrutinib-rituximab versus 28% with placebo-rituximab (hazard ratio for progression or death, 0.20; P<0.001).

## 2023-10-10 ENCOUNTER — Encounter: Payer: Self-pay | Admitting: Nurse Practitioner

## 2023-10-10 ENCOUNTER — Inpatient Hospital Stay (HOSPITAL_BASED_OUTPATIENT_CLINIC_OR_DEPARTMENT_OTHER): Payer: Medicaid Other | Admitting: Nurse Practitioner

## 2023-10-10 ENCOUNTER — Other Ambulatory Visit (HOSPITAL_COMMUNITY): Payer: Self-pay

## 2023-10-10 ENCOUNTER — Other Ambulatory Visit (HOSPITAL_BASED_OUTPATIENT_CLINIC_OR_DEPARTMENT_OTHER): Payer: Self-pay

## 2023-10-10 ENCOUNTER — Inpatient Hospital Stay: Payer: Medicaid Other

## 2023-10-10 ENCOUNTER — Inpatient Hospital Stay (HOSPITAL_BASED_OUTPATIENT_CLINIC_OR_DEPARTMENT_OTHER): Payer: Medicaid Other | Admitting: Hematology and Oncology

## 2023-10-10 ENCOUNTER — Other Ambulatory Visit: Payer: Self-pay

## 2023-10-10 VITALS — BP 128/79 | HR 68 | Temp 97.7°F | Resp 13 | Wt 134.1 lb

## 2023-10-10 DIAGNOSIS — R634 Abnormal weight loss: Secondary | ICD-10-CM

## 2023-10-10 DIAGNOSIS — R630 Anorexia: Secondary | ICD-10-CM | POA: Diagnosis not present

## 2023-10-10 DIAGNOSIS — C8800 Waldenstrom macroglobulinemia not having achieved remission: Secondary | ICD-10-CM

## 2023-10-10 DIAGNOSIS — G893 Neoplasm related pain (acute) (chronic): Secondary | ICD-10-CM

## 2023-10-10 DIAGNOSIS — Z515 Encounter for palliative care: Secondary | ICD-10-CM

## 2023-10-10 LAB — CBC WITH DIFFERENTIAL (CANCER CENTER ONLY)
Abs Immature Granulocytes: 0.06 10*3/uL (ref 0.00–0.07)
Basophils Absolute: 0 10*3/uL (ref 0.0–0.1)
Basophils Relative: 0 %
Eosinophils Absolute: 0.3 10*3/uL (ref 0.0–0.5)
Eosinophils Relative: 2 %
HCT: 35.3 % — ABNORMAL LOW (ref 39.0–52.0)
Hemoglobin: 11.6 g/dL — ABNORMAL LOW (ref 13.0–17.0)
Immature Granulocytes: 1 %
Lymphocytes Relative: 10 %
Lymphs Abs: 1.2 10*3/uL (ref 0.7–4.0)
MCH: 27.3 pg (ref 26.0–34.0)
MCHC: 32.9 g/dL (ref 30.0–36.0)
MCV: 83.1 fL (ref 80.0–100.0)
Monocytes Absolute: 0.7 10*3/uL (ref 0.1–1.0)
Monocytes Relative: 6 %
Neutro Abs: 9.9 10*3/uL — ABNORMAL HIGH (ref 1.7–7.7)
Neutrophils Relative %: 81 %
Platelet Count: 361 10*3/uL (ref 150–400)
RBC: 4.25 MIL/uL (ref 4.22–5.81)
RDW: 14.3 % (ref 11.5–15.5)
WBC Count: 12.2 10*3/uL — ABNORMAL HIGH (ref 4.0–10.5)
nRBC: 0 % (ref 0.0–0.2)

## 2023-10-10 LAB — CMP (CANCER CENTER ONLY)
ALT: 6 U/L (ref 0–44)
AST: 11 U/L — ABNORMAL LOW (ref 15–41)
Albumin: 3.1 g/dL — ABNORMAL LOW (ref 3.5–5.0)
Alkaline Phosphatase: 77 U/L (ref 38–126)
Anion gap: 7 (ref 5–15)
BUN: 16 mg/dL (ref 8–23)
CO2: 27 mmol/L (ref 22–32)
Calcium: 8.2 mg/dL — ABNORMAL LOW (ref 8.9–10.3)
Chloride: 104 mmol/L (ref 98–111)
Creatinine: 0.83 mg/dL (ref 0.61–1.24)
GFR, Estimated: >60 mL/min (ref >60)
Glucose, Bld: 102 mg/dL — ABNORMAL HIGH (ref 70–99)
Potassium: 4.2 mmol/L (ref 3.5–5.1)
Sodium: 138 mmol/L (ref 135–145)
Total Bilirubin: 0.4 mg/dL (ref 0.0–1.2)
Total Protein: 5.6 g/dL — ABNORMAL LOW (ref 6.5–8.1)

## 2023-10-10 MED ORDER — IBRUTINIB 420 MG PO TABS: 420.0000 mg | ORAL_TABLET | Freq: Every day | ORAL | 2 refills | Status: DC

## 2023-10-10 MED ORDER — DRONABINOL 5 MG PO CAPS
5.0000 mg | ORAL_CAPSULE | Freq: Two times a day (BID) | ORAL | 0 refills | Status: DC
  Filled 2023-10-10: qty 60, 30d supply, fill #0

## 2023-10-10 MED ORDER — AZITHROMYCIN 250 MG PO TABS
ORAL_TABLET | ORAL | 0 refills | Status: DC
  Filled 2023-10-10: qty 6, 5d supply, fill #0

## 2023-10-11 ENCOUNTER — Other Ambulatory Visit (HOSPITAL_COMMUNITY): Payer: Self-pay

## 2023-10-11 ENCOUNTER — Other Ambulatory Visit: Payer: Self-pay | Admitting: Nurse Practitioner

## 2023-10-11 DIAGNOSIS — G893 Neoplasm related pain (acute) (chronic): Secondary | ICD-10-CM

## 2023-10-11 DIAGNOSIS — G4700 Insomnia, unspecified: Secondary | ICD-10-CM

## 2023-10-11 DIAGNOSIS — C8800 Waldenstrom macroglobulinemia not having achieved remission: Secondary | ICD-10-CM

## 2023-10-11 DIAGNOSIS — Z515 Encounter for palliative care: Secondary | ICD-10-CM

## 2023-10-11 MED ORDER — DIAZEPAM 5 MG PO TABS
5.0000 mg | ORAL_TABLET | Freq: Two times a day (BID) | ORAL | 0 refills | Status: DC | PRN
  Filled 2023-10-11: qty 60, 30d supply, fill #0

## 2023-10-14 ENCOUNTER — Other Ambulatory Visit: Payer: Self-pay | Admitting: *Deleted

## 2023-10-14 MED ORDER — IBRUTINIB 420 MG PO TABS: 420.0000 mg | ORAL_TABLET | Freq: Every day | ORAL | 2 refills | Status: DC

## 2023-10-20 ENCOUNTER — Other Ambulatory Visit (HOSPITAL_COMMUNITY): Payer: Self-pay

## 2023-10-20 ENCOUNTER — Other Ambulatory Visit: Payer: Self-pay | Admitting: Nurse Practitioner

## 2023-10-20 DIAGNOSIS — G893 Neoplasm related pain (acute) (chronic): Secondary | ICD-10-CM

## 2023-10-20 DIAGNOSIS — C8800 Waldenstrom macroglobulinemia not having achieved remission: Secondary | ICD-10-CM

## 2023-10-20 DIAGNOSIS — Z515 Encounter for palliative care: Secondary | ICD-10-CM

## 2023-10-20 MED ORDER — OXYCODONE HCL 10 MG PO TABS
10.0000 mg | ORAL_TABLET | ORAL | 0 refills | Status: DC | PRN
  Filled 2023-10-21: qty 90, 15d supply, fill #0

## 2023-10-21 ENCOUNTER — Other Ambulatory Visit: Payer: Self-pay | Admitting: Nurse Practitioner

## 2023-10-21 ENCOUNTER — Other Ambulatory Visit: Payer: Self-pay

## 2023-10-21 ENCOUNTER — Other Ambulatory Visit (HOSPITAL_COMMUNITY): Payer: Self-pay

## 2023-10-24 ENCOUNTER — Other Ambulatory Visit: Payer: Self-pay | Admitting: Nurse Practitioner

## 2023-10-24 ENCOUNTER — Other Ambulatory Visit (HOSPITAL_COMMUNITY): Payer: Self-pay

## 2023-10-24 DIAGNOSIS — Z515 Encounter for palliative care: Secondary | ICD-10-CM

## 2023-10-24 DIAGNOSIS — C8800 Waldenstrom macroglobulinemia not having achieved remission: Secondary | ICD-10-CM

## 2023-10-24 DIAGNOSIS — G893 Neoplasm related pain (acute) (chronic): Secondary | ICD-10-CM

## 2023-10-24 MED ORDER — METHADONE HCL 10 MG PO TABS
10.0000 mg | ORAL_TABLET | Freq: Three times a day (TID) | ORAL | 0 refills | Status: DC
  Filled 2023-10-24: qty 90, 30d supply, fill #0

## 2023-10-25 ENCOUNTER — Encounter: Payer: Medicare Other | Admitting: Student

## 2023-10-25 NOTE — Progress Notes (Deleted)
   Established Patient Office Visit  Subjective   Patient ID: DONTREY SNELLGROVE, male    DOB: 1961-06-07  Age: 63 y.o. MRN: [id number]  No chief complaint on file.   LAAKEA PEREIRA is a 63 year old male who presents to the clinic for ***. Please see problem based assessment and plan for additional details.   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 06/29/2021   Dyspnea 06/11/2021   Gynecomastia 06/04/2021   Osteoarthritis (arthritis due to wear and tear of joints) 06/04/2021   Screening for depression 06/04/2021   Waldenstrom macroglobulinemia 05/24/2021   Rash in adult 05/18/2021   Tinnitus aurium, 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   HFrEF (heart failure with reduced ejection fraction) (HCC) 04/20/2021   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   Pleuritic pain 04/20/2021   Chronic systolic heart failure (HCC) 04/17/2021   Weight loss, unintentional 04/08/2021   Microcytic anemia 04/08/2021   Mitral regurgitation 04/08/2021   Benign essential hypertension 09/22/2009      ROS    Objective:     There were no vitals taken for this visit. BP Readings from Last 3 Encounters:  10/10/23 128/79  09/12/23 112/88  08/15/23 139/89   Wt Readings from Last 3 Encounters:  10/10/23 134 lb 1.6 oz (60.8 kg)  09/12/23 142 lb 14.4 oz (64.8 kg)  08/15/23 132 lb 6.4 oz (60.1 kg)      Physical Exam   No results found for any visits on 10/25/23.  {Labs (Optional):23779}  The ASCVD Risk score (Arnett DK, et al., 2019) failed to calculate for the following reasons:   The valid total cholesterol range is 130 to 320 mg/dL    Assessment & Plan:   Problem List Items Addressed This Visit   None   No follow-ups on file.    Damien Lease, DO

## 2023-10-27 ENCOUNTER — Other Ambulatory Visit: Payer: Self-pay | Admitting: Nurse Practitioner

## 2023-10-27 ENCOUNTER — Other Ambulatory Visit (HOSPITAL_COMMUNITY): Payer: Self-pay

## 2023-10-27 ENCOUNTER — Ambulatory Visit: Payer: Medicaid Other | Admitting: Dermatology

## 2023-10-27 DIAGNOSIS — G4700 Insomnia, unspecified: Secondary | ICD-10-CM

## 2023-10-27 DIAGNOSIS — G893 Neoplasm related pain (acute) (chronic): Secondary | ICD-10-CM

## 2023-10-27 DIAGNOSIS — Z515 Encounter for palliative care: Secondary | ICD-10-CM

## 2023-10-27 DIAGNOSIS — C8800 Waldenstrom macroglobulinemia not having achieved remission: Secondary | ICD-10-CM

## 2023-10-28 ENCOUNTER — Other Ambulatory Visit (HOSPITAL_COMMUNITY): Payer: Self-pay

## 2023-10-28 ENCOUNTER — Other Ambulatory Visit: Payer: Self-pay | Admitting: Nurse Practitioner

## 2023-10-28 MED ORDER — DIAZEPAM 5 MG PO TABS
5.0000 mg | ORAL_TABLET | Freq: Two times a day (BID) | ORAL | 0 refills | Status: DC | PRN
  Filled 2023-10-28 – 2023-11-09 (×2): qty 60, 30d supply, fill #0

## 2023-11-02 ENCOUNTER — Other Ambulatory Visit: Payer: Self-pay | Admitting: Nurse Practitioner

## 2023-11-02 ENCOUNTER — Other Ambulatory Visit (HOSPITAL_COMMUNITY): Payer: Self-pay

## 2023-11-02 DIAGNOSIS — Z515 Encounter for palliative care: Secondary | ICD-10-CM

## 2023-11-02 DIAGNOSIS — G893 Neoplasm related pain (acute) (chronic): Secondary | ICD-10-CM

## 2023-11-02 DIAGNOSIS — C8800 Waldenstrom macroglobulinemia not having achieved remission: Secondary | ICD-10-CM

## 2023-11-02 MED ORDER — OXYCODONE HCL 10 MG PO TABS
10.0000 mg | ORAL_TABLET | ORAL | 0 refills | Status: DC | PRN
  Filled 2023-11-04: qty 90, 15d supply, fill #0

## 2023-11-04 ENCOUNTER — Other Ambulatory Visit (HOSPITAL_COMMUNITY): Payer: Self-pay

## 2023-11-04 ENCOUNTER — Other Ambulatory Visit: Payer: Self-pay | Admitting: Hematology and Oncology

## 2023-11-04 MED ORDER — SILDENAFIL CITRATE 50 MG PO TABS
50.0000 mg | ORAL_TABLET | Freq: Every day | ORAL | 0 refills | Status: DC | PRN
  Filled 2023-11-04 – 2024-02-22 (×2): qty 20, 20d supply, fill #0

## 2023-11-07 ENCOUNTER — Other Ambulatory Visit: Payer: Self-pay | Admitting: Physician Assistant

## 2023-11-07 DIAGNOSIS — C8800 Waldenstrom macroglobulinemia not having achieved remission: Secondary | ICD-10-CM

## 2023-11-08 ENCOUNTER — Inpatient Hospital Stay: Payer: Medicare (Managed Care) | Admitting: Physician Assistant

## 2023-11-08 ENCOUNTER — Inpatient Hospital Stay: Payer: Medicare (Managed Care) | Attending: Hematology and Oncology

## 2023-11-08 DIAGNOSIS — Z79620 Long term (current) use of immunosuppressive biologic: Secondary | ICD-10-CM | POA: Insufficient documentation

## 2023-11-08 DIAGNOSIS — G893 Neoplasm related pain (acute) (chronic): Secondary | ICD-10-CM | POA: Insufficient documentation

## 2023-11-08 DIAGNOSIS — M25551 Pain in right hip: Secondary | ICD-10-CM | POA: Insufficient documentation

## 2023-11-08 DIAGNOSIS — F32A Depression, unspecified: Secondary | ICD-10-CM | POA: Insufficient documentation

## 2023-11-08 DIAGNOSIS — Z8249 Family history of ischemic heart disease and other diseases of the circulatory system: Secondary | ICD-10-CM | POA: Insufficient documentation

## 2023-11-08 DIAGNOSIS — F419 Anxiety disorder, unspecified: Secondary | ICD-10-CM | POA: Insufficient documentation

## 2023-11-08 DIAGNOSIS — M25552 Pain in left hip: Secondary | ICD-10-CM | POA: Insufficient documentation

## 2023-11-08 DIAGNOSIS — I110 Hypertensive heart disease with heart failure: Secondary | ICD-10-CM | POA: Insufficient documentation

## 2023-11-08 DIAGNOSIS — C8800 Waldenstrom macroglobulinemia not having achieved remission: Secondary | ICD-10-CM | POA: Insufficient documentation

## 2023-11-08 DIAGNOSIS — Z5986 Financial insecurity: Secondary | ICD-10-CM | POA: Insufficient documentation

## 2023-11-08 DIAGNOSIS — F17290 Nicotine dependence, other tobacco product, uncomplicated: Secondary | ICD-10-CM | POA: Insufficient documentation

## 2023-11-08 DIAGNOSIS — I5022 Chronic systolic (congestive) heart failure: Secondary | ICD-10-CM | POA: Insufficient documentation

## 2023-11-08 DIAGNOSIS — Z885 Allergy status to narcotic agent status: Secondary | ICD-10-CM | POA: Insufficient documentation

## 2023-11-08 DIAGNOSIS — Z79899 Other long term (current) drug therapy: Secondary | ICD-10-CM | POA: Insufficient documentation

## 2023-11-08 DIAGNOSIS — R590 Localized enlarged lymph nodes: Secondary | ICD-10-CM | POA: Insufficient documentation

## 2023-11-08 DIAGNOSIS — R5383 Other fatigue: Secondary | ICD-10-CM | POA: Insufficient documentation

## 2023-11-08 DIAGNOSIS — D472 Monoclonal gammopathy: Secondary | ICD-10-CM | POA: Insufficient documentation

## 2023-11-08 DIAGNOSIS — R112 Nausea with vomiting, unspecified: Secondary | ICD-10-CM | POA: Insufficient documentation

## 2023-11-08 DIAGNOSIS — M7989 Other specified soft tissue disorders: Secondary | ICD-10-CM | POA: Insufficient documentation

## 2023-11-09 ENCOUNTER — Other Ambulatory Visit (HOSPITAL_COMMUNITY): Payer: Self-pay

## 2023-11-10 ENCOUNTER — Telehealth: Payer: Self-pay | Admitting: Physician Assistant

## 2023-11-11 ENCOUNTER — Inpatient Hospital Stay: Payer: Medicare (Managed Care)

## 2023-11-11 ENCOUNTER — Inpatient Hospital Stay (HOSPITAL_BASED_OUTPATIENT_CLINIC_OR_DEPARTMENT_OTHER): Payer: Medicare (Managed Care) | Admitting: Physician Assistant

## 2023-11-11 ENCOUNTER — Inpatient Hospital Stay (HOSPITAL_BASED_OUTPATIENT_CLINIC_OR_DEPARTMENT_OTHER): Payer: Medicare (Managed Care) | Admitting: Nurse Practitioner

## 2023-11-11 ENCOUNTER — Other Ambulatory Visit (HOSPITAL_COMMUNITY): Payer: Self-pay

## 2023-11-11 ENCOUNTER — Encounter: Payer: Self-pay | Admitting: Nurse Practitioner

## 2023-11-11 VITALS — BP 122/76 | HR 75 | Temp 97.7°F | Resp 16 | Ht 68.0 in | Wt 130.3 lb

## 2023-11-11 DIAGNOSIS — R112 Nausea with vomiting, unspecified: Secondary | ICD-10-CM | POA: Diagnosis not present

## 2023-11-11 DIAGNOSIS — Z79899 Other long term (current) drug therapy: Secondary | ICD-10-CM | POA: Diagnosis not present

## 2023-11-11 DIAGNOSIS — F17290 Nicotine dependence, other tobacco product, uncomplicated: Secondary | ICD-10-CM | POA: Diagnosis not present

## 2023-11-11 DIAGNOSIS — G893 Neoplasm related pain (acute) (chronic): Secondary | ICD-10-CM

## 2023-11-11 DIAGNOSIS — M25551 Pain in right hip: Secondary | ICD-10-CM | POA: Diagnosis not present

## 2023-11-11 DIAGNOSIS — Z8249 Family history of ischemic heart disease and other diseases of the circulatory system: Secondary | ICD-10-CM | POA: Diagnosis not present

## 2023-11-11 DIAGNOSIS — C8800 Waldenstrom macroglobulinemia not having achieved remission: Secondary | ICD-10-CM

## 2023-11-11 DIAGNOSIS — Z515 Encounter for palliative care: Secondary | ICD-10-CM

## 2023-11-11 DIAGNOSIS — I5022 Chronic systolic (congestive) heart failure: Secondary | ICD-10-CM | POA: Diagnosis not present

## 2023-11-11 DIAGNOSIS — M7989 Other specified soft tissue disorders: Secondary | ICD-10-CM | POA: Diagnosis not present

## 2023-11-11 DIAGNOSIS — R590 Localized enlarged lymph nodes: Secondary | ICD-10-CM | POA: Diagnosis not present

## 2023-11-11 DIAGNOSIS — D472 Monoclonal gammopathy: Secondary | ICD-10-CM | POA: Diagnosis present

## 2023-11-11 DIAGNOSIS — R5383 Other fatigue: Secondary | ICD-10-CM | POA: Diagnosis not present

## 2023-11-11 DIAGNOSIS — M799 Soft tissue disorder, unspecified: Secondary | ICD-10-CM | POA: Diagnosis not present

## 2023-11-11 DIAGNOSIS — Z885 Allergy status to narcotic agent status: Secondary | ICD-10-CM | POA: Diagnosis not present

## 2023-11-11 DIAGNOSIS — Z5986 Financial insecurity: Secondary | ICD-10-CM | POA: Diagnosis not present

## 2023-11-11 DIAGNOSIS — Z79620 Long term (current) use of immunosuppressive biologic: Secondary | ICD-10-CM | POA: Diagnosis not present

## 2023-11-11 DIAGNOSIS — I110 Hypertensive heart disease with heart failure: Secondary | ICD-10-CM | POA: Diagnosis not present

## 2023-11-11 DIAGNOSIS — F419 Anxiety disorder, unspecified: Secondary | ICD-10-CM | POA: Diagnosis not present

## 2023-11-11 DIAGNOSIS — M25552 Pain in left hip: Secondary | ICD-10-CM | POA: Diagnosis not present

## 2023-11-11 DIAGNOSIS — F32A Depression, unspecified: Secondary | ICD-10-CM | POA: Diagnosis not present

## 2023-11-11 LAB — CBC WITH DIFFERENTIAL (CANCER CENTER ONLY)
Abs Immature Granulocytes: 0.02 10*3/uL (ref 0.00–0.07)
Basophils Absolute: 0 10*3/uL (ref 0.0–0.1)
Basophils Relative: 1 %
Eosinophils Absolute: 0.7 10*3/uL — ABNORMAL HIGH (ref 0.0–0.5)
Eosinophils Relative: 11 %
HCT: 40.4 % (ref 39.0–52.0)
Hemoglobin: 12.8 g/dL — ABNORMAL LOW (ref 13.0–17.0)
Immature Granulocytes: 0 %
Lymphocytes Relative: 19 %
Lymphs Abs: 1.3 10*3/uL (ref 0.7–4.0)
MCH: 26.3 pg (ref 26.0–34.0)
MCHC: 31.7 g/dL (ref 30.0–36.0)
MCV: 83.1 fL (ref 80.0–100.0)
Monocytes Absolute: 0.5 10*3/uL (ref 0.1–1.0)
Monocytes Relative: 8 %
Neutro Abs: 3.9 10*3/uL (ref 1.7–7.7)
Neutrophils Relative %: 61 %
Platelet Count: 187 10*3/uL (ref 150–400)
RBC: 4.86 MIL/uL (ref 4.22–5.81)
RDW: 15.2 % (ref 11.5–15.5)
WBC Count: 6.5 10*3/uL (ref 4.0–10.5)
nRBC: 0 % (ref 0.0–0.2)

## 2023-11-11 LAB — CMP (CANCER CENTER ONLY)
ALT: 6 U/L (ref 0–44)
AST: 14 U/L — ABNORMAL LOW (ref 15–41)
Albumin: 3.8 g/dL (ref 3.5–5.0)
Alkaline Phosphatase: 63 U/L (ref 38–126)
Anion gap: 7 (ref 5–15)
BUN: 17 mg/dL (ref 8–23)
CO2: 26 mmol/L (ref 22–32)
Calcium: 8.9 mg/dL (ref 8.9–10.3)
Chloride: 109 mmol/L (ref 98–111)
Creatinine: 0.98 mg/dL (ref 0.61–1.24)
GFR, Estimated: >60 mL/min (ref >60)
Glucose, Bld: 87 mg/dL (ref 70–99)
Potassium: 3.5 mmol/L (ref 3.5–5.1)
Sodium: 142 mmol/L (ref 135–145)
Total Bilirubin: 0.5 mg/dL (ref 0.0–1.2)
Total Protein: 5.7 g/dL — ABNORMAL LOW (ref 6.5–8.1)

## 2023-11-11 MED ORDER — METHADONE HCL 10 MG PO TABS
10.0000 mg | ORAL_TABLET | Freq: Three times a day (TID) | ORAL | 0 refills | Status: DC
  Filled 2023-11-24: qty 90, 30d supply, fill #0

## 2023-11-11 MED ORDER — OXYCODONE HCL 10 MG PO TABS
10.0000 mg | ORAL_TABLET | ORAL | 0 refills | Status: DC | PRN
  Filled 2023-11-18 (×2): qty 90, 15d supply, fill #0

## 2023-11-11 MED ORDER — DULOXETINE HCL 30 MG PO CPEP
30.0000 mg | ORAL_CAPSULE | Freq: Every day | ORAL | 3 refills | Status: DC
  Filled 2023-11-11: qty 30, 30d supply, fill #0
  Filled 2024-01-02: qty 30, 30d supply, fill #1

## 2023-11-11 NOTE — Progress Notes (Signed)
Palliative Medicine [facility]  [phone] [phone]   Name: Connor Solomon Date: 11/11/2023 MRN: [id number]  DOB: 1961/01/14  Patient Care Team: [name], DO as PCP - General Pickenpack-Cousar, [name], NP as Nurse Practitioner (Nurse Practitioner)   REASON FOR CONSULTATION: Connor Solomon is a 64 y.o. 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 Palliative ask to see for symptom management.  SOCIAL HISTORY:     reports that he quit smoking about 3 years ago. His smoking use included cigars. He has never used smokeless tobacco. He reports that he does not drink alcohol and does not use drugs.  ADVANCE DIRECTIVES:    CODE STATUS:   PAST MEDICAL HISTORY: Past Medical History:  Diagnosis Date   Acute heart failure (HCC) 04/08/2021   Acute HFrEF (heart failure with reduced ejection fraction) (HCC) 04/07/2021   Arthritis    hands, elbows bilaterally   Cancer (HCC)    Full dentures    Hypertension    Pt states he doen not have HTN and has never been treated for HTN   Kidney stone on left side last stone 4-5 yrs ago   recurrent (3 episodes)   Panic disorder    pt states has resolved   Syncope    resolved- was related to panic attacks    PAST SURGICAL HISTORY:  Past Surgical History:  Procedure Laterality Date   CARPAL METACARPAL FUSION WITH DISTAL RADIAL BONE GRAFT Left 05/30/2013   Procedure: LEFT THUMB METACARPAL JOINT FUSION;  Surgeon: [name], MD;  Location: [facility];  Service: Orthopedics;  Laterality: Left;   ESOPHAGOGASTRODUODENOSCOPY N/A 02/12/2014   Procedure: ESOPHAGOGASTRODUODENOSCOPY (EGD);  Surgeon: [name], MD;  Location: [facility] ENDOSCOPY;  Service: Endoscopy;  Laterality: N/A;   FOREIGN BODY REMOVAL N/A 02/12/2014   Procedure: FOREIGN BODY REMOVAL;  Surgeon: [name], MD;  Location: [facility]  ENDOSCOPY;  Service: Endoscopy;  Laterality: N/A;   OPEN REDUCTION INTERNAL FIXATION (ORIF) DISTAL RADIAL FRACTURE Left 11/29/2012   Procedure: OPEN REDUCTION INTERNAL FIXATION (ORIF) DISTAL RADIAL FRACTURE;  Surgeon: [name], MD;  Location: [facility];  Service: Orthopedics;  Laterality: Left;  LEFT DISTAL RADIUS OSTEOTOMY WITH BONE GRAFT   RIGHT/LEFT HEART CATH AND CORONARY ANGIOGRAPHY N/A 04/09/2021   Procedure: RIGHT/LEFT HEART CATH AND CORONARY ANGIOGRAPHY;  Surgeon: [name], MD;  Location: [facility];  Service: Cardiovascular;  Laterality: N/A;   TENDON REPAIR Left 02/07/2013   Procedure: LEFT EXTENSOR INDICIS PROPRIUS  TO EXTENSOR POLLICIS LONGUS TENDON TRANSFER;  Surgeon: [name], MD;  Location: [facility];  Service: Orthopedics;  Laterality: Left;   WISDOM TOOTH EXTRACTION     WRIST SURGERY     fx only    HEMATOLOGY/ONCOLOGY HISTORY:  Oncology History  Waldenstrom macroglobulinemia  05/24/2021 Initial Diagnosis   Waldenstrom macroglobulinemia (HCC)   06/05/2021 - 12/30/2021 Chemotherapy   Patient is on Treatment Plan : NON-HODGKINS LYMPHOMA Rituximab q7d       ALLERGIES:  is allergic to rituxan [rituximab] and hydrocodone.  MEDICATIONS:  Current Outpatient Medications  Medication Sig Dispense Refill   acetaminophen (TYLENOL) 500 MG tablet Take 500 mg by mouth every 6 (six) hours as needed. (Patient not taking: Reported on 11/11/2023)     albuterol (PROAIR HFA) 108 (90 Base) MCG/ACT inhaler Inhale 2 puffs into the lungs every 6 (six) hours as  needed for wheezing or shortness of breath. 6.7 g 2   azithromycin (ZITHROMAX Z-PAK) 250 MG tablet Take 2 tablets by mouth on day 1, then 1 tablet daily on days 2 to 5 6 each 0   carvedilol (COREG) 3.125 MG tablet Take 1 tablet by mouth 2 times daily. 60 tablet 6   chlorhexidine (HIBICLENS) 4 % external liquid Apply topically daily as needed. Dilute with water and clean  wound with this.  Rinse thoroughly afterwards 237 mL 0   clobetasol cream (TEMOVATE) 0.05 % Apply 1 Application topically 2 (two) times daily. Apply topically to the arms and hands twice daily for up to two weeks 30 g 2   dapagliflozin propanediol (FARXIGA) 10 MG TABS tablet Take 1 tablet (10 mg total) by mouth daily before breakfast. NEEDS FOLLOW UP APPOINTMENT FOR ANYMORE REFILLS 30 tablet 3   diazepam (VALIUM) 5 MG tablet Take 1 tablet (5 mg total) by mouth every 12 (twelve) hours as needed for anxiety. 60 tablet 0   dronabinol (MARINOL) 5 MG capsule Take 1 capsule (5 mg total) by mouth 2 (two) times daily before a meal. 60 capsule 0   eplerenone (INSPRA) 25 MG tablet Take 0.5 tablets (12.5 mg total) by mouth daily. NEEDS FOLLOW UP APPOINTMENT FOR MORE REFILLS 15 tablet 3   ferrous sulfate (FEROSUL) 325 (65 FE) MG tablet Take 1 tablet by mouth daily with breakfast. Please take with a source of Vitamin C 90 tablet 3   ibrutinib (IMBRUVICA) 420 MG tablet Take 1 tablet (420 mg total) by mouth daily. 28 tablet 2   methadone (DOLOPHINE) 10 MG tablet Take 1 tablet (10 mg total) by mouth every 8 (eight) hours. 90 tablet 0   naloxone (NARCAN) nasal spray 4 mg/0.1 mL Take for overdose 1 each 0   naloxone (NARCAN) nasal spray 4 mg/0.1 mL Take for overdose as directed 2 each 0   ondansetron (ZOFRAN) 8 MG tablet Take 1 tablet (8 mg) by mouth every 8 hours as needed. 30 tablet 0   Oxycodone HCl 10 MG TABS Take 1 tablet (10 mg total) by mouth every 4 (four) hours as needed. 90 tablet 0   potassium chloride SA (KLOR-CON M) 20 MEQ tablet Take 1 tablet (20 mEq total) by mouth daily. 30 tablet 1   sacubitril-valsartan (ENTRESTO) 24-26 MG Take 1 tablet by mouth 2 (two) times daily. 180 tablet 3   sildenafil (VIAGRA) 50 MG tablet Take 1 tablet (50 mg total) by mouth daily as needed for erectile dysfunction. 20 tablet 0   No current facility-administered medications for this visit.    VITAL SIGNS: There were no  vitals taken for this visit. There were no vitals filed for this visit.     Estimated body mass index is 19.81 kg/m as calculated from the following:   Height as of an earlier encounter on 11/11/23: 5\' 8"  (1.727 m).   Weight as of an earlier encounter on 11/11/23: 130 lb 4.8 oz (59.1 kg).   PERFORMANCE STATUS (ECOG) : 1 - Symptomatic but completely ambulatory  Assessment NAD RRR Normal breathing pattern  AAO X4  IMPRESSION:  Mr. Goyer presents to clinic for with concerns of worsening joint pain. Denies nausea, vomiting, constipation, or diarrhea.   He experiences worsening joint and bone pain, particularly in the hips, spine, shin bone, and knees. The pain is described as a 'different kind of pain' and has been progressively worsening over time. He is currently on methadone and oxycodone for pain  management but feels these medications are becoming less effective. He also takes Tylenol and is allergic to hydrocodone, which causes itching. Despite taking methadone as prescribed, he notices no significant relief and is concerned about the effectiveness of his pain management regimen.  He is on Imbruvica for Waldenstrom's macroglobulinemia, which is stable. He is concerned about the pain being related to his condition but notes that recent lab results indicate stability. Mr. Warmuth discusses significant stressors, including his wife's severe illness, financial stress due to car repairs, and issues with Medicare and Medicaid affecting his income. He is worried about the impact of these stressors on his health and pain levels.   We discussed Ramirez's increased pain and possible etiology including arthritic. He states he is taking Tylenol several times a day with minimal relief. Education provided on use of current medication including methadone and oxycodone. He is aware no changes will be made to this regimen. Given use of Imbruvica will stay away from NSAIDS at this time. Education provided on  addition of Cymbalta to assist with pain management including use, administration, efficacy, and possible side effects. Patient verbalized understanding.   All questions answered and support provided.   Goals of care   7/10: I created space and opportunity for Mr. Marinello to share his thoughts and feelings regarding his current health and condition. He is realistic speaking to his understanding of Christian faith. He is taking things one day at a time preparing himself for when his time comes. Until then he wishes to continue treating the treatable allowing himself every opportunity to thrive. He knows at anytime he can discuss with his medical team his wishes and focus on his comfort.   (5/16) Mr. Farias shares his realistic understanding of his condition. He and his wife are making sure all "affairs" are in order as his biggest worry is leaving his wife in a financial constraint. Reports communicating with his life insurance policy etc. Emotional support provided.    He is emotional expressing racing thoughts during idol time or interruptions in his sleep as his mind wonders thinking about worst case scenario. Is requesting something to assist with sleeping and anxiety. Acknowledged his request. Education provided on limited use in collaboration with other medications including zyprexa. He verbalized understanding and aware  we will continue to support and monitor for needs.   Assessment and Plan  Chronic Cancer Related Pain Increased joint and bone pain despite stable Waldenstrom's. Currently on Methadone and Oxycodone with limited relief. Patient reports possible tolerance to medications. Education provided on tolerance and taking medications as prescribed. He is aware no adjustments will be made to current regimen including his oxycodone or methadone.  -Start Cymbalta 30 mg once daily for additional pain control, monitor for side effects.  -Continue Methadone and Oxycodone, next refill due on  February 8th and 12th respectively. -Tylenol ES every 8 hours as needed.  -Plan to call patient next week for follow-up on pain control.  Waldenstrom's Macroglobulinemia Stable condition, currently on Imbruvica. -Continue Imbruvica as prescribed per Oncology.   General Health Maintenance -Plan for regular follow-up appointments as previously scheduled. -I will plan to see patient back in clinic in 4-6 weeks.  Patient expressed understanding and was in agreement with this plan. He also understands that He can call the clinic at any time with any questions, concerns, or complaints.    Any controlled substances utilized were prescribed in the context of palliative care. PDMP has been reviewed.    Visit consisted of  counseling and education dealing with the complex and emotionally intense issues of symptom management and palliative care in the setting of serious and potentially life-threatening illness.  Willette Alma, AGPCNP-BC  Palliative Medicine Team/[facility]

## 2023-11-11 NOTE — Progress Notes (Signed)
[facility] [phone]   [phone]  PROGRESS NOTE  Patient Care Team: [name], DO as PCP - General Pickenpack-Cousar, [name], NP as Nurse Practitioner (Nurse Practitioner)  Hematological/Oncological History # Waldenstrom's Macroglobulinemia # IgM Monoclonal Gammopathy 04/07/2021: CT A/p showed mildly enlarged retroperitoneal lymph nodes and bilateral inguinal lymph nodes.  04/08/2021: IgM Kappa M protein 1.2 04/09/2021: WBC 11.1, Hgb 9.5, MCV 75.4, Plt 525 04/20/2021: Kappa 977, Lambda 7.8, K/L ratio 125.28. Serum viscosity 1.8 05/06/2021: Establish care with Dr. 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 Infusion reaction with ritux, held halfway through.  06/12/2021: Cycle 2 Day 1 of Ibrutinib + Rituximab 06/19/2021: Cycle 3 Day 1 of Ibrutinib + Rituximab 06/26/2021: Cycle 4 Day 1 of rituximab. Continued on daily ibrutinib 12/02/2021: Cycle 5 Day 1 of Ibrutinib + Rituximab 12/09/2021: Cycle 6 Day 1 of Ibrutinib + Rituximab 12/16/2021: Cycle 7 Day 1 of Ibrutinib + Rituximab 12/23/2021: Held Cycle 8 of Ritxumab due to nausea/vomiting, elevated creatinine, finger infection  Interval History:  Connor Solomon 63 y.o. male with medical history significant for Waldenstrom's macroglobulinemia who presents for a follow up visit. The patient's last visit was on 10/10/2023. In the interim since the last visit he has continued on Ibrutinib therapy.   On exam today Connor Solomon reports that his fatigue is worsening partly due to uncontrolled pain. He has worsening pain involving his joints, hips and lower extremities. He is compliant with taking his pain medication which includes methadone and short acting oxycodone. He admits that he has taken double dose of oxycodone occasionally.  He reports his nausea and vomiting episodes are intermittent and improve some with zofran as needed. He reports his bowel habits are unchanged. He reports his swelling in his hands and legs are overall improved since prior.  He is willing and able to proceed with ibrutinib therapy. He denies fevers, chills, night sweats, shortness of breath, chest pain or cough. He has no other complaints. Full 10 point ROS is listed below.   MEDICAL HISTORY:  Past Medical History:  Diagnosis Date   Acute heart failure (HCC) 04/08/2021   Acute HFrEF (heart failure with reduced ejection fraction) (HCC) 04/07/2021   Arthritis    hands, elbows bilaterally   Cancer (HCC)    Full dentures    Hypertension    Pt states he doen not have HTN and has never been treated for HTN   Kidney stone on left side last stone 4-5 yrs ago   recurrent (3 episodes)   Panic disorder    pt states has resolved   Syncope    resolved- was related to panic attacks    SURGICAL HISTORY: Past Surgical History:  Procedure Laterality Date   CARPAL METACARPAL FUSION WITH DISTAL RADIAL BONE GRAFT Left 05/30/2013   Procedure: LEFT THUMB METACARPAL JOINT FUSION;  Surgeon: [name], MD;  Location: [facility];  Service: Orthopedics;  Laterality: Left;   ESOPHAGOGASTRODUODENOSCOPY N/A 02/12/2014   Procedure: ESOPHAGOGASTRODUODENOSCOPY (EGD);  Surgeon: [name], MD;  Location: [facility] ENDOSCOPY;  Service: Endoscopy;  Laterality: N/A;   FOREIGN BODY REMOVAL N/A 02/12/2014   Procedure: FOREIGN BODY REMOVAL;  Surgeon: [name], MD;  Location: [facility] ENDOSCOPY;  Service: Endoscopy;  Laterality: N/A;   OPEN REDUCTION INTERNAL FIXATION (ORIF) DISTAL RADIAL FRACTURE Left 11/29/2012   Procedure: OPEN REDUCTION  INTERNAL FIXATION (ORIF) DISTAL RADIAL FRACTURE;  Surgeon: [name], MD;  Location: [facility];  Service: Orthopedics;  Laterality: Left;  LEFT DISTAL RADIUS OSTEOTOMY WITH BONE GRAFT   RIGHT/LEFT  HEART CATH AND CORONARY ANGIOGRAPHY N/A 04/09/2021   Procedure: RIGHT/LEFT HEART CATH AND CORONARY ANGIOGRAPHY;  Surgeon: [name], MD;  Location: [facility];  Service: Cardiovascular;  Laterality: N/A;   TENDON REPAIR Left 02/07/2013   Procedure: LEFT EXTENSOR INDICIS PROPRIUS  TO EXTENSOR POLLICIS LONGUS TENDON TRANSFER;  Surgeon: [name], MD;  Location: [facility];  Service: Orthopedics;  Laterality: Left;   WISDOM TOOTH EXTRACTION     WRIST SURGERY     fx only    SOCIAL HISTORY: Social History   Socioeconomic History   Marital status: Single    Spouse name: Not on file   Number of children: Not on file   Years of education: Not on file   Highest education level: Not on file  Occupational History   Not on file  Tobacco Use   Smoking status: Former    Types: Cigars    Quit date: 10/11/2020    Years since quitting: 3.0   Smokeless tobacco: Never   Tobacco comments:    smokes 1 cigar a week  Vaping Use   Vaping status: Never Used  Substance and Sexual Activity   Alcohol use: No   Drug use: No   Sexual activity: Not on file  Other Topics Concern   Not on file  Social History Narrative   Not on file   Social Drivers of Health   Financial Resource Strain: High Risk (07/17/2021)   Overall Financial Resource Strain (CARDIA)    Difficulty of Paying Living Expenses: Very hard  Food Insecurity: Food Insecurity Present (04/10/2021)   Hunger Vital Sign    Worried About Running Out of Food in the Last Year: Sometimes true    Ran Out of Food in the Last Year: Sometimes true  Transportation Needs: No Transportation Needs (04/10/2021)   PRAPARE - Administrator, Civil Service (Medical): No    Lack of Transportation (Non-Medical): No  Physical Activity: Not on file  Stress: Stress Concern Present (09/01/2021)   [employer] of Occupational Health - Occupational Stress Questionnaire    Feeling of Stress : Very much  Social  Connections: Not on file  Intimate Partner Violence: Not on file    FAMILY HISTORY: Family History  Problem Relation Age of Onset   Heart attack Father    Sudden Cardiac Death Father 10   Diabetes Neg Hx    Hyperlipidemia Neg Hx    Hypertension Neg Hx     ALLERGIES:  is allergic to rituxan [rituximab] and hydrocodone.  MEDICATIONS:  Current Outpatient Medications  Medication Sig Dispense Refill   albuterol (PROAIR HFA) 108 (90 Base) MCG/ACT inhaler Inhale 2 puffs into the lungs every 6 (six) hours as needed for wheezing or shortness of breath. 6.7 g 2   azithromycin (ZITHROMAX Z-PAK) 250 MG tablet Take 2 tablets by mouth on day 1, then 1 tablet daily on days 2 to 5 6 each 0   carvedilol (COREG) 3.125 MG tablet Take 1 tablet by mouth 2 times daily. 60 tablet 6   chlorhexidine (HIBICLENS) 4 % external liquid Apply topically daily as needed. Dilute with water and clean wound with this.  Rinse thoroughly afterwards 237 mL 0   clobetasol cream (TEMOVATE) 0.05 % Apply 1 Application  topically 2 (two) times daily. Apply topically to the arms and hands twice daily for up to two weeks 30 g 2   dapagliflozin propanediol (FARXIGA) 10 MG TABS tablet Take 1 tablet (10 mg total) by mouth daily before breakfast. NEEDS FOLLOW UP APPOINTMENT FOR ANYMORE REFILLS 30 tablet 3   diazepam (VALIUM) 5 MG tablet Take 1 tablet (5 mg total) by mouth every 12 (twelve) hours as needed for anxiety. 60 tablet 0   dronabinol (MARINOL) 5 MG capsule Take 1 capsule (5 mg total) by mouth 2 (two) times daily before a meal. 60 capsule 0   eplerenone (INSPRA) 25 MG tablet Take 0.5 tablets (12.5 mg total) by mouth daily. NEEDS FOLLOW UP APPOINTMENT FOR MORE REFILLS 15 tablet 3   ferrous sulfate (FEROSUL) 325 (65 FE) MG tablet Take 1 tablet by mouth daily with breakfast. Please take with a source of Vitamin C 90 tablet 3   ibrutinib (IMBRUVICA) 420 MG tablet Take 1 tablet (420 mg total) by mouth daily. 28 tablet 2   methadone  (DOLOPHINE) 10 MG tablet Take 1 tablet (10 mg total) by mouth every 8 (eight) hours. 90 tablet 0   naloxone (NARCAN) nasal spray 4 mg/0.1 mL Take for overdose 1 each 0   naloxone (NARCAN) nasal spray 4 mg/0.1 mL Take for overdose as directed 2 each 0   ondansetron (ZOFRAN) 8 MG tablet Take 1 tablet (8 mg) by mouth every 8 hours as needed. 30 tablet 0   Oxycodone HCl 10 MG TABS Take 1 tablet (10 mg total) by mouth every 4 (four) hours as needed. 90 tablet 0   potassium chloride SA (KLOR-CON M) 20 MEQ tablet Take 1 tablet (20 mEq total) by mouth daily. 30 tablet 1   sacubitril-valsartan (ENTRESTO) 24-26 MG Take 1 tablet by mouth 2 (two) times daily. 180 tablet 3   sildenafil (VIAGRA) 50 MG tablet Take 1 tablet (50 mg total) by mouth daily as needed for erectile dysfunction. 20 tablet 0   acetaminophen (TYLENOL) 500 MG tablet Take 500 mg by mouth every 6 (six) hours as needed. (Patient not taking: Reported on 11/11/2023)     No current facility-administered medications for this visit.    REVIEW OF SYSTEMS:   Constitutional: ( - ) fevers, ( - )  chills , ( - ) night sweats Eyes: ( - ) blurriness of vision, ( - ) double vision, ( - ) watery eyes Ears, nose, mouth, throat, and face: ( - ) mucositis, ( - ) sore throat Respiratory: ( - ) cough, ( - ) dyspnea, ( - ) wheezes Cardiovascular: ( - ) palpitation, ( - ) chest discomfort, ( - ) lower extremity swelling Gastrointestinal:  (- ) nausea, ( - ) heartburn, ( - ) change in bowel habits Skin: ( - ) abnormal skin rashes Lymphatics: ( - ) new lymphadenopathy, ( - ) easy bruising Neurological: ( - ) numbness, ( - ) tingling, ( - ) new weaknesses Behavioral/Psych: ( - ) mood change, ( - ) new changes  All other systems were reviewed with the patient and are negative.  PHYSICAL EXAMINATION: ECOG PERFORMANCE STATUS: 2 - Symptomatic, <50% confined to bed  Vitals:   11/11/23 0840  BP: 122/76  Pulse: 75  Resp: 16  Temp: 97.7 F (36.5 C)  SpO2:  100%    Filed Weights   11/11/23 0840  Weight: 130 lb 4.8 oz (59.1 kg)     GENERAL: Chronically ill appearing middle-aged Caucasian male, alert,  no distress and comfortable SKIN: Dry cracking rash of hands bilaterally.  No issues on the chest, face, or lower extremities.  Skin color, texture, turgor are normal, no rashes or significant lesions.  EYES: conjunctiva are pink and non-injected, sclera clear LUNGS: clear to auscultation and percussion with normal breathing effort HEART: regular rate & rhythm and no murmurs and no lower extremity edema  MSK: Bilateral edema of hands.  PSYCH: alert & oriented x 3, fluent speech NEURO: no focal motor/sensory deficits   LABORATORY DATA:  I have reviewed the data as listed    Latest Ref Rng & Units 11/11/2023    8:22 AM 10/10/2023   10:49 AM 09/12/2023   10:58 AM  CBC  WBC 4.0 - 10.5 K/uL 6.5  12.2  2.7   Hemoglobin 13.0 - 17.0 g/dL 40.9  81.1  91.4   Hematocrit 39.0 - 52.0 % 40.4  35.3  36.7   Platelets 150 - 400 K/uL 187  361  208        Latest Ref Rng & Units 10/10/2023   10:49 AM 09/12/2023   10:58 AM 08/15/2023   11:37 AM  CMP  Glucose 70 - 99 mg/dL 782  956  86   BUN 8 - 23 mg/dL 16  11  10    Creatinine 0.61 - 1.24 mg/dL 2.13  0.86  5.78   Sodium 135 - 145 mmol/L 138  138  136   Potassium 3.5 - 5.1 mmol/L 4.2  3.5  4.3   Chloride 98 - 111 mmol/L 104  102  105   CO2 22 - 32 mmol/L 27  29  28    Calcium 8.9 - 10.3 mg/dL 8.2  8.8  9.2   Total Protein 6.5 - 8.1 g/dL 5.6  5.7  6.2   Total Bilirubin 0.0 - 1.2 mg/dL 0.4  0.6  0.6   Alkaline Phos 38 - 126 U/L 77  65  62   AST 15 - 41 U/L 11  18  14    ALT 0 - 44 U/L 6  12  9      Lab Results  Component Value Date   MPROTEIN 0.1 (H) 09/12/2023   MPROTEIN 0.1 (H) 08/15/2023   MPROTEIN 0.1 (H) 07/18/2023   Lab Results  Component Value Date   KPAFRELGTCHN 29.3 (H) 09/12/2023   KPAFRELGTCHN 22.2 (H) 08/15/2023   KPAFRELGTCHN 27.5 (H) 07/18/2023   LAMBDASER 7.1 09/12/2023    LAMBDASER 4.7 (L) 08/15/2023   LAMBDASER 7.4 07/18/2023   KAPLAMBRATIO 4.13 (H) 09/12/2023   KAPLAMBRATIO 4.72 (H) 08/15/2023   KAPLAMBRATIO 3.72 (H) 07/18/2023    RADIOGRAPHIC STUDIES: No results found.  ASSESSMENT & PLAN Connor Solomon 63 y.o. male with medical history significant for Waldenstrom's macroglobulinemia who presents for a follow up visit.   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  There is no clear evidence of hyperviscosity syndrome at this time.  At this time we will plan to proceed with ibrutinib and rituximab therapy.  One could also consider Bendamustine and rituximab therapy but given the patient's degree of anemia I would prefer pursuing ibrutinib and rituximab at this time.  Additionally ibrutinib/rituximab are category 1 recommendations per the NCCN guidelines.  The treatment will consist of ibrutinib 420 mg p.o. daily with rituximab on weeks 1 through 4 as well as weeks 27 through 30.  Previously we discussed the risks and  benefits of therapy including (but not limited to) risk of bleeding, fatigue, atypical infections, and cardiac arrhythmias.  The patient voices understanding of this plan moving forward.   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 --Started second round of weekly rituximab on 12/02/2021. Continued weekly x 4 weeks.   --last rituximab dose held due to missed visits/illness.  Plan: --Labs today were reviewed without any intervention needed.  Labs show white cell count 6.5, hemoglobin 12.8, MCV 83.1, platelets  187. Pending SPEP and sFLC levels but prior levels appear stable. --Currently on ibrutinib 420 mg PO daily.  --RTC in 4 weeks with labs   # Lesion on Back Of Head- resolved  -- Patient had a ping-pong ball sized lesion on the back of his head.  It appeared to be draining and has some dried blood and crusting. -- Patient evaluated by Dr. Onalee Hua in dermatology.  Lesion was biopsied and found not to be cancerous. -- Appears to be healing well and improving greatly.  Continue per Dr. Kermit Balo recommendations.  # Rash-improved markedly -- Affecting hands bilaterally, upper extremities bilaterally, and back.  Not affecting face, chest, or lower extremities. -- Patient has dryness and cracking as well as erythema on the upper extremities. -- Etiology is unclear.  Patient is using hydrating creams but is not effective.  Made referral to dermatology for evaluation, he is established with Dr. Langston Reusing.  #Pain Management --Pain was poorly controlled on hydrocodone therapy previously.  --Patient was previously admitted for for opioid overdose with presumed fentanyl --Currently pain regimen includes methadone 10 mg TID and oxycodone 10 mg PRN for breakthrough pain per palliative care. Patient reports pain is worsening and admits to taking double dose of oxycodone occasionally. Rates pain as 7/10. -- Appreciate recommendations of palliative care.  #Normocytic Anemia  --Hgb 12.8, improved --previously there was a component of iron deficiency anemia as well.  Currently on PO ferrous sulfate. --Continue to monitor.  #Nausea and vomiting--stable -- Concern that the nausea and vomiting may not be related to the ibrutinib therapy and potentially tied to poor gastric motility due to opioid prescription.  --Unable to tolerate olanzopine so discontinued --Not taking metoclopramide 10 mg every 8 hours  --continue zofran PRN.   #Fatigue #Anxiety/Depression: --Fatigue and depression have worsened  recently --Anxiety has improved with Valium  --Will request further evaluation by palliative care team  #Supportive Care -- chemotherapy education complete -- port placement not required   No orders of the defined types were placed in this encounter.  All questions were answered. The patient knows to call the clinic with any problems, questions or concerns.  I have spent a total of 30 minutes minutes of face-to-face and non-face-to-face time, preparing to see the patient,  performing a medically appropriate examination, counseling and educating the patient, ordering medications/tests, referring and communicating with other health care professionals, documenting clinical information in the electronic health record, and care coordination.   Georga Kaufmann PA-C Dept of Hematology and Oncology [facility] at [facility] Phone: [phone]   11/11/2023 9:02 AM  Dimopoulos MA, Lennette Bihari, Joaquin Courts, Mahe B, Herbaux C, Tam C, Orsucci L, Palomba ML, Matous JV, Shustik C, Kastritis E, Sholes, Li J, Salman  Z, Graef T, Buske C; iNNOVATE Study Group and the [employer]. Phase 3 Trial of Ibrutinib plus Rituximab in Waldenstrm's Macroglobulinemia. Macy Mis J Med. 2018 Jun 21;378(25):2399-2410.   --At 30 months, the progression-free survival rate was 82% with ibrutinib-rituximab versus 28% with placebo-rituximab (hazard ratio for progression or death, 0.20; P<0.001).

## 2023-11-13 ENCOUNTER — Other Ambulatory Visit: Payer: Self-pay | Admitting: Hematology and Oncology

## 2023-11-13 DIAGNOSIS — R630 Anorexia: Secondary | ICD-10-CM

## 2023-11-13 DIAGNOSIS — Z515 Encounter for palliative care: Secondary | ICD-10-CM

## 2023-11-13 DIAGNOSIS — R634 Abnormal weight loss: Secondary | ICD-10-CM

## 2023-11-13 DIAGNOSIS — C8800 Waldenstrom macroglobulinemia not having achieved remission: Secondary | ICD-10-CM

## 2023-11-13 LAB — KAPPA/LAMBDA LIGHT CHAINS
Kappa free light chain: 26.1 mg/L — ABNORMAL HIGH (ref 3.3–19.4)
Kappa, lambda light chain ratio: 2.93 — ABNORMAL HIGH (ref 0.26–1.65)
Lambda free light chains: 8.9 mg/L (ref 5.7–26.3)

## 2023-11-14 ENCOUNTER — Other Ambulatory Visit: Payer: Self-pay

## 2023-11-14 ENCOUNTER — Other Ambulatory Visit (HOSPITAL_COMMUNITY): Payer: Self-pay

## 2023-11-14 LAB — MULTIPLE MYELOMA PANEL, SERUM
Albumin SerPl Elph-Mcnc: 3.5 g/dL (ref 2.9–4.4)
Albumin/Glob SerPl: 1.8 — ABNORMAL HIGH (ref 0.7–1.7)
Alpha 1: 0.2 g/dL (ref 0.0–0.4)
Alpha2 Glob SerPl Elph-Mcnc: 0.6 g/dL (ref 0.4–1.0)
B-Globulin SerPl Elph-Mcnc: 0.7 g/dL (ref 0.7–1.3)
Gamma Glob SerPl Elph-Mcnc: 0.4 g/dL (ref 0.4–1.8)
Globulin, Total: 2 g/dL — ABNORMAL LOW (ref 2.2–3.9)
IgA: 49 mg/dL — ABNORMAL LOW (ref 61–437)
IgG (Immunoglobin G), Serum: 397 mg/dL — ABNORMAL LOW (ref 603–1613)
IgM (Immunoglobulin M), Srm: 101 mg/dL (ref 20–172)
M Protein SerPl Elph-Mcnc: 0.1 g/dL — ABNORMAL HIGH
Total Protein ELP: 5.5 g/dL — ABNORMAL LOW (ref 6.0–8.5)

## 2023-11-14 MED ORDER — DRONABINOL 5 MG PO CAPS
5.0000 mg | ORAL_CAPSULE | Freq: Two times a day (BID) | ORAL | 0 refills | Status: DC
  Filled 2023-11-14: qty 60, 30d supply, fill #0

## 2023-11-16 ENCOUNTER — Other Ambulatory Visit (HOSPITAL_COMMUNITY): Payer: Self-pay

## 2023-11-17 ENCOUNTER — Telehealth: Payer: Self-pay

## 2023-11-17 ENCOUNTER — Other Ambulatory Visit: Payer: Self-pay | Admitting: Nurse Practitioner

## 2023-11-17 ENCOUNTER — Other Ambulatory Visit (HOSPITAL_COMMUNITY): Payer: Self-pay

## 2023-11-17 NOTE — Telephone Encounter (Signed)
 Pt called and LVM asking if he could pick up his pain medications earlier, this RN confirmed with Landa Pine, NP and the PDMP that pt refill is due tomorrow, 11/18/23. Attempted to call pt to inform him, no answer, LVM and callback number.

## 2023-11-18 ENCOUNTER — Other Ambulatory Visit: Payer: Self-pay

## 2023-11-18 ENCOUNTER — Other Ambulatory Visit (HOSPITAL_COMMUNITY): Payer: Self-pay

## 2023-11-19 ENCOUNTER — Other Ambulatory Visit (HOSPITAL_COMMUNITY): Payer: Self-pay

## 2023-11-21 ENCOUNTER — Encounter: Payer: Self-pay | Admitting: General Practice

## 2023-11-21 ENCOUNTER — Other Ambulatory Visit (HOSPITAL_COMMUNITY): Payer: Self-pay

## 2023-11-21 NOTE — Progress Notes (Signed)
 [facility] Spiritual Care Note  Referred by Palliative Care to reconnect with Mr Ryken for emotional/spiritual support. Left voicemail with direct number, encouraging return call.   [address] Dorice Gardner, [place], [facility] Pager [phone] Voicemail [phone]

## 2023-11-23 ENCOUNTER — Other Ambulatory Visit (HOSPITAL_COMMUNITY): Payer: Self-pay

## 2023-11-23 ENCOUNTER — Other Ambulatory Visit: Payer: Self-pay | Admitting: Nurse Practitioner

## 2023-11-23 DIAGNOSIS — G893 Neoplasm related pain (acute) (chronic): Secondary | ICD-10-CM

## 2023-11-23 DIAGNOSIS — G4700 Insomnia, unspecified: Secondary | ICD-10-CM

## 2023-11-23 DIAGNOSIS — C8800 Waldenstrom macroglobulinemia not having achieved remission: Secondary | ICD-10-CM

## 2023-11-23 DIAGNOSIS — Z515 Encounter for palliative care: Secondary | ICD-10-CM

## 2023-11-23 MED ORDER — DIAZEPAM 5 MG PO TABS
5.0000 mg | ORAL_TABLET | Freq: Two times a day (BID) | ORAL | 0 refills | Status: DC | PRN
Start: 2023-12-08 — End: 2024-01-02
  Filled 2023-11-23 – 2023-12-08 (×2): qty 60, 30d supply, fill #0

## 2023-11-23 MED ORDER — OXYCODONE HCL 10 MG PO TABS
10.0000 mg | ORAL_TABLET | ORAL | 0 refills | Status: DC | PRN
Start: 2023-12-01 — End: 2023-12-13
  Filled 2023-12-01: qty 90, 15d supply, fill #0

## 2023-11-24 ENCOUNTER — Other Ambulatory Visit (HOSPITAL_COMMUNITY): Payer: Self-pay

## 2023-12-01 ENCOUNTER — Other Ambulatory Visit: Payer: Self-pay

## 2023-12-01 ENCOUNTER — Other Ambulatory Visit (HOSPITAL_COMMUNITY): Payer: Self-pay

## 2023-12-05 ENCOUNTER — Other Ambulatory Visit: Payer: Self-pay | Admitting: Physician Assistant

## 2023-12-05 DIAGNOSIS — C8800 Waldenstrom macroglobulinemia not having achieved remission: Secondary | ICD-10-CM

## 2023-12-05 NOTE — Progress Notes (Deleted)
 Palliative Medicine [facility]  [phone] [phone]) [phone]   Name: Connor Solomon Date: 12/05/2023 MRN: [id number]  DOB: January 20, 1961  Patient Care Team: [name], DO as PCP - General Pickenpack-Cousar, [name], NP as Nurse Practitioner (Nurse Practitioner)   REASON FOR CONSULTATION: Connor Solomon is a 63 y.o. 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 Palliative ask to see for symptom management.  SOCIAL HISTORY:     reports that he quit smoking about 3 years ago. His smoking use included cigars. He has never used smokeless tobacco. He reports that he does not drink alcohol and does not use drugs.  ADVANCE DIRECTIVES:    CODE STATUS:   PAST MEDICAL HISTORY: Past Medical History:  Diagnosis Date   Acute heart failure (HCC) 04/08/2021   Acute HFrEF (heart failure with reduced ejection fraction) (HCC) 04/07/2021   Arthritis    hands, elbows bilaterally   Cancer (HCC)    Full dentures    Hypertension    Pt states he doen not have HTN and has never been treated for HTN   Kidney stone on left side last stone 4-5 yrs ago   recurrent (3 episodes)   Panic disorder    pt states has resolved   Syncope    resolved- was related to panic attacks    PAST SURGICAL HISTORY:  Past Surgical History:  Procedure Laterality Date   CARPAL METACARPAL FUSION WITH DISTAL RADIAL BONE GRAFT Left 05/30/2013   Procedure: LEFT THUMB METACARPAL JOINT FUSION;  Surgeon: [name], MD;  Location: [facility];  Service: Orthopedics;  Laterality: Left;   ESOPHAGOGASTRODUODENOSCOPY N/A 02/12/2014   Procedure: ESOPHAGOGASTRODUODENOSCOPY (EGD);  Surgeon: [name], MD;  Location: [facility] ENDOSCOPY;  Service: Endoscopy;  Laterality: N/A;   FOREIGN BODY REMOVAL N/A 02/12/2014   Procedure: FOREIGN BODY REMOVAL;  Surgeon: [name], MD;  Location: [facility]  ENDOSCOPY;  Service: Endoscopy;  Laterality: N/A;   OPEN REDUCTION INTERNAL FIXATION (ORIF) DISTAL RADIAL FRACTURE Left 11/29/2012   Procedure: OPEN REDUCTION INTERNAL FIXATION (ORIF) DISTAL RADIAL FRACTURE;  Surgeon: [name], MD;  Location: [facility];  Service: Orthopedics;  Laterality: Left;  LEFT DISTAL RADIUS OSTEOTOMY WITH BONE GRAFT   RIGHT/LEFT HEART CATH AND CORONARY ANGIOGRAPHY N/A 04/09/2021   Procedure: RIGHT/LEFT HEART CATH AND CORONARY ANGIOGRAPHY;  Surgeon: [name], MD;  Location: [facility] IN[facility]AS[facility];  Service: Cardiovascular;  Laterality: N/A;   TENDON REPAIR Left 02/07/2013   Procedure: LEFT EXTENSOR INDICIS PROPRIUS  TO EXTENSOR POLLICIS LONGUS TENDON TRANSFER;  Surgeon: [name], MD;  Location:  SURGERY CENTER;  Service: Orthopedics;  Laterality: Left;   WISDOM TOOTH EXTRACTION     WRIST SURGERY     fx only    HEMATOLOGY/ONCOLOGY HISTORY:  Oncology History  Waldenstrom macroglobulinemia  05/24/2021 Initial Diagnosis   Waldenstrom macroglobulinemia (HCC)   06/05/2021 - 12/30/2021 Chemotherapy   Patient is on Treatment Plan : NON-HODGKINS LYMPHOMA Rituximab q7d       ALLERGIES:  is allergic to rituxan [rituximab] and hydrocodone.  MEDICATIONS:  Current Outpatient Medications  Medication Sig Dispense Refill   acetaminophen (TYLENOL) 500 MG tablet Take 500 mg by mouth every 6 (six) hours as needed. (Patient not taking: Reported on 11/11/2023)     albuterol (PROAIR HFA) 108 (90 Base) MCG/ACT inhaler Inhale 2 puffs into the lungs every 6 (six) hours as  needed for wheezing or shortness of breath. 6.7 g 2   azithromycin (ZITHROMAX Z-PAK) 250 MG tablet Take 2 tablets by mouth on day 1, then 1 tablet daily on days 2 to 5 6 each 0   carvedilol (COREG) 3.125 MG tablet Take 1 tablet by mouth 2 times daily. 60 tablet 6   chlorhexidine (HIBICLENS) 4 % external liquid Apply topically daily as needed. Dilute with water and clean  wound with this.  Rinse thoroughly afterwards 237 mL 0   clobetasol cream (TEMOVATE) 0.05 % Apply 1 Application topically 2 (two) times daily. Apply topically to the arms and hands twice daily for up to two weeks 30 g 2   dapagliflozin propanediol (FARXIGA) 10 MG TABS tablet Take 1 tablet (10 mg total) by mouth daily before breakfast. NEEDS FOLLOW UP APPOINTMENT FOR ANYMORE REFILLS 30 tablet 3   [START ON 12/08/2023] diazepam (VALIUM) 5 MG tablet Take 1 tablet (5 mg total) by mouth every 12 (twelve) hours as needed for anxiety. 60 tablet 0   dronabinol (MARINOL) 5 MG capsule Take 1 capsule (5 mg total) by mouth 2 (two) times daily before a meal. 60 capsule 0   DULoxetine (CYMBALTA) 30 MG capsule Take 1 capsule (30 mg total) by mouth daily. 30 capsule 3   eplerenone (INSPRA) 25 MG tablet Take 0.5 tablets (12.5 mg total) by mouth daily. NEEDS FOLLOW UP APPOINTMENT FOR MORE REFILLS 15 tablet 3   ferrous sulfate (FEROSUL) 325 (65 FE) MG tablet Take 1 tablet by mouth daily with breakfast. Please take with a source of Vitamin C 90 tablet 3   ibrutinib (IMBRUVICA) 420 MG tablet Take 1 tablet (420 mg total) by mouth daily. 28 tablet 2   methadone (DOLOPHINE) 10 MG tablet Take 1 tablet (10 mg total) by mouth every 8 (eight) hours. 90 tablet 0   naloxone (NARCAN) nasal spray 4 mg/0.1 mL Take for overdose 1 each 0   naloxone (NARCAN) nasal spray 4 mg/0.1 mL Take for overdose as directed 2 each 0   ondansetron (ZOFRAN) 8 MG tablet Take 1 tablet (8 mg) by mouth every 8 hours as needed. 30 tablet 0   Oxycodone HCl 10 MG TABS Take 1 tablet (10 mg total) by mouth every 4 (four) hours as needed. 90 tablet 0   potassium chloride SA (KLOR-CON M) 20 MEQ tablet Take 1 tablet (20 mEq total) by mouth daily. 30 tablet 1   sacubitril-valsartan (ENTRESTO) 24-26 MG Take 1 tablet by mouth 2 (two) times daily. 180 tablet 3   sildenafil (VIAGRA) 50 MG tablet Take 1 tablet (50 mg total) by mouth daily as needed for erectile  dysfunction. 20 tablet 0   No current facility-administered medications for this visit.    VITAL SIGNS: There were no vitals taken for this visit. There were no vitals filed for this visit.     Estimated body mass index is 19.81 kg/m as calculated from the following:   Height as of 11/11/23: 5\' 8"  (1.727 m).   Weight as of 11/11/23: 130 lb 4.8 oz (59.1 kg).   PERFORMANCE STATUS (ECOG) : 1 - Symptomatic but completely ambulatory  Assessment NAD RRR Normal breathing pattern  AAO X4  IMPRESSION:  Mr. Juhasz presents to clinic for with concerns of worsening joint pain. Denies nausea, vomiting, constipation, or diarrhea.   He experiences worsening joint and bone pain, particularly in the hips, spine, shin bone, and knees. The pain is described as a 'different kind of pain' and has  been progressively worsening over time. He is currently on methadone and oxycodone for pain management but feels these medications are becoming less effective. He also takes Tylenol and is allergic to hydrocodone, which causes itching. Despite taking methadone as prescribed, he notices no significant relief and is concerned about the effectiveness of his pain management regimen.  He is on Imbruvica for Waldenstrom's macroglobulinemia, which is stable. He is concerned about the pain being related to his condition but notes that recent lab results indicate stability. Mr. Redner discusses significant stressors, including his wife's severe illness, financial stress due to car repairs, and issues with Medicare and Medicaid affecting his income. He is worried about the impact of these stressors on his health and pain levels.   We discussed Amandeep's increased pain and possible etiology including arthritic. He states he is taking Tylenol several times a day with minimal relief. Education provided on use of current medication including methadone and oxycodone. He is aware no changes will be made to this regimen. Given use of  Imbruvica will stay away from NSAIDS at this time. Education provided on addition of Cymbalta to assist with pain management including use, administration, efficacy, and possible side effects. Patient verbalized understanding.   All questions answered and support provided.   Goals of care   7/10: I created space and opportunity for Mr. Tavenner to share his thoughts and feelings regarding his current health and condition. He is realistic speaking to his understanding of Christian faith. He is taking things one day at a time preparing himself for when his time comes. Until then he wishes to continue treating the treatable allowing himself every opportunity to thrive. He knows at anytime he can discuss with his medical team his wishes and focus on his comfort.   (5/16) Mr. Crapps shares his realistic understanding of his condition. He and his wife are making sure all "affairs" are in order as his biggest worry is leaving his wife in a financial constraint. Reports communicating with his life insurance policy etc. Emotional support provided.    He is emotional expressing racing thoughts during idol time or interruptions in his sleep as his mind wonders thinking about worst case scenario. Is requesting something to assist with sleeping and anxiety. Acknowledged his request. Education provided on limited use in collaboration with other medications including zyprexa. He verbalized understanding and aware  we will continue to support and monitor for needs.   Assessment and Plan  Chronic Cancer Related Pain Increased joint and bone pain despite stable Waldenstrom's. Currently on Methadone and Oxycodone with limited relief. Patient reports possible tolerance to medications. Education provided on tolerance and taking medications as prescribed. He is aware no adjustments will be made to current regimen including his oxycodone or methadone.  -Start Cymbalta 30 mg once daily for additional pain control, monitor for  side effects.  -Continue Methadone and Oxycodone, next refill due on February 8th and 12th respectively. -Tylenol ES every 8 hours as needed.  -Plan to call patient next week for follow-up on pain control.  Waldenstrom's Macroglobulinemia Stable condition, currently on Imbruvica. -Continue Imbruvica as prescribed per Oncology.   General Health Maintenance -Plan for regular follow-up appointments as previously scheduled. -I will plan to see patient back in clinic in 4-6 weeks.  Patient expressed understanding and was in agreement with this plan. He also understands that He can call the clinic at any time with any questions, concerns, or complaints.    Any controlled substances utilized were prescribed in the  context of palliative care. PDMP has been reviewed.    Visit consisted of counseling and education dealing with the complex and emotionally intense issues of symptom management and palliative care in the setting of serious and potentially life-threatening illness.  Willette Alma, AGPCNP-BC  Palliative Medicine Team/[facility]

## 2023-12-06 ENCOUNTER — Inpatient Hospital Stay: Payer: Medicare (Managed Care) | Admitting: Physician Assistant

## 2023-12-06 ENCOUNTER — Inpatient Hospital Stay: Payer: Medicare (Managed Care) | Attending: Hematology and Oncology | Admitting: Nurse Practitioner

## 2023-12-06 ENCOUNTER — Inpatient Hospital Stay: Payer: Medicare (Managed Care)

## 2023-12-06 ENCOUNTER — Telehealth: Payer: Self-pay | Admitting: Hematology and Oncology

## 2023-12-06 DIAGNOSIS — R262 Difficulty in walking, not elsewhere classified: Secondary | ICD-10-CM | POA: Insufficient documentation

## 2023-12-06 DIAGNOSIS — C8800 Waldenstrom macroglobulinemia not having achieved remission: Secondary | ICD-10-CM | POA: Insufficient documentation

## 2023-12-06 DIAGNOSIS — Z79620 Long term (current) use of immunosuppressive biologic: Secondary | ICD-10-CM | POA: Insufficient documentation

## 2023-12-06 DIAGNOSIS — I110 Hypertensive heart disease with heart failure: Secondary | ICD-10-CM | POA: Insufficient documentation

## 2023-12-06 DIAGNOSIS — D472 Monoclonal gammopathy: Secondary | ICD-10-CM | POA: Insufficient documentation

## 2023-12-06 DIAGNOSIS — I5022 Chronic systolic (congestive) heart failure: Secondary | ICD-10-CM | POA: Insufficient documentation

## 2023-12-06 DIAGNOSIS — R0602 Shortness of breath: Secondary | ICD-10-CM | POA: Insufficient documentation

## 2023-12-06 DIAGNOSIS — R519 Headache, unspecified: Secondary | ICD-10-CM | POA: Insufficient documentation

## 2023-12-06 DIAGNOSIS — R059 Cough, unspecified: Secondary | ICD-10-CM | POA: Insufficient documentation

## 2023-12-06 DIAGNOSIS — Z8249 Family history of ischemic heart disease and other diseases of the circulatory system: Secondary | ICD-10-CM | POA: Insufficient documentation

## 2023-12-06 DIAGNOSIS — R112 Nausea with vomiting, unspecified: Secondary | ICD-10-CM | POA: Insufficient documentation

## 2023-12-06 DIAGNOSIS — D509 Iron deficiency anemia, unspecified: Secondary | ICD-10-CM | POA: Insufficient documentation

## 2023-12-06 DIAGNOSIS — Z79899 Other long term (current) drug therapy: Secondary | ICD-10-CM | POA: Insufficient documentation

## 2023-12-06 DIAGNOSIS — Z885 Allergy status to narcotic agent status: Secondary | ICD-10-CM | POA: Insufficient documentation

## 2023-12-06 DIAGNOSIS — Z87891 Personal history of nicotine dependence: Secondary | ICD-10-CM | POA: Insufficient documentation

## 2023-12-06 DIAGNOSIS — Z5986 Financial insecurity: Secondary | ICD-10-CM | POA: Insufficient documentation

## 2023-12-07 ENCOUNTER — Telehealth: Payer: Self-pay | Admitting: Hematology and Oncology

## 2023-12-08 ENCOUNTER — Other Ambulatory Visit (HOSPITAL_COMMUNITY): Payer: Self-pay

## 2023-12-08 ENCOUNTER — Other Ambulatory Visit (HOSPITAL_COMMUNITY): Payer: Self-pay | Admitting: Family Medicine

## 2023-12-09 ENCOUNTER — Other Ambulatory Visit (HOSPITAL_COMMUNITY): Payer: Self-pay

## 2023-12-09 ENCOUNTER — Inpatient Hospital Stay (HOSPITAL_BASED_OUTPATIENT_CLINIC_OR_DEPARTMENT_OTHER): Payer: Medicare (Managed Care) | Admitting: Hematology and Oncology

## 2023-12-09 ENCOUNTER — Inpatient Hospital Stay: Payer: Medicare (Managed Care)

## 2023-12-09 VITALS — BP 130/85 | HR 72 | Temp 98.1°F | Resp 14 | Wt 130.4 lb

## 2023-12-09 DIAGNOSIS — D472 Monoclonal gammopathy: Secondary | ICD-10-CM | POA: Diagnosis present

## 2023-12-09 DIAGNOSIS — R519 Headache, unspecified: Secondary | ICD-10-CM | POA: Diagnosis not present

## 2023-12-09 DIAGNOSIS — Z87891 Personal history of nicotine dependence: Secondary | ICD-10-CM | POA: Diagnosis not present

## 2023-12-09 DIAGNOSIS — D509 Iron deficiency anemia, unspecified: Secondary | ICD-10-CM | POA: Diagnosis not present

## 2023-12-09 DIAGNOSIS — Z5986 Financial insecurity: Secondary | ICD-10-CM | POA: Diagnosis not present

## 2023-12-09 DIAGNOSIS — G893 Neoplasm related pain (acute) (chronic): Secondary | ICD-10-CM

## 2023-12-09 DIAGNOSIS — R059 Cough, unspecified: Secondary | ICD-10-CM | POA: Diagnosis not present

## 2023-12-09 DIAGNOSIS — C8800 Waldenstrom macroglobulinemia not having achieved remission: Secondary | ICD-10-CM

## 2023-12-09 DIAGNOSIS — Z8249 Family history of ischemic heart disease and other diseases of the circulatory system: Secondary | ICD-10-CM | POA: Diagnosis not present

## 2023-12-09 DIAGNOSIS — R0602 Shortness of breath: Secondary | ICD-10-CM | POA: Diagnosis not present

## 2023-12-09 DIAGNOSIS — R262 Difficulty in walking, not elsewhere classified: Secondary | ICD-10-CM | POA: Diagnosis not present

## 2023-12-09 DIAGNOSIS — Z79899 Other long term (current) drug therapy: Secondary | ICD-10-CM | POA: Diagnosis not present

## 2023-12-09 DIAGNOSIS — R112 Nausea with vomiting, unspecified: Secondary | ICD-10-CM | POA: Diagnosis not present

## 2023-12-09 DIAGNOSIS — Z885 Allergy status to narcotic agent status: Secondary | ICD-10-CM | POA: Diagnosis not present

## 2023-12-09 DIAGNOSIS — I110 Hypertensive heart disease with heart failure: Secondary | ICD-10-CM | POA: Diagnosis not present

## 2023-12-09 DIAGNOSIS — I5022 Chronic systolic (congestive) heart failure: Secondary | ICD-10-CM | POA: Diagnosis not present

## 2023-12-09 DIAGNOSIS — Z79620 Long term (current) use of immunosuppressive biologic: Secondary | ICD-10-CM | POA: Diagnosis not present

## 2023-12-09 LAB — CMP (CANCER CENTER ONLY)
ALT: 7 U/L (ref 0–44)
AST: 16 U/L (ref 15–41)
Albumin: 3.9 g/dL (ref 3.5–5.0)
Alkaline Phosphatase: 61 U/L (ref 38–126)
Anion gap: 6 (ref 5–15)
BUN: 21 mg/dL (ref 8–23)
CO2: 26 mmol/L (ref 22–32)
Calcium: 8.7 mg/dL — ABNORMAL LOW (ref 8.9–10.3)
Chloride: 105 mmol/L (ref 98–111)
Creatinine: 0.96 mg/dL (ref 0.61–1.24)
GFR, Estimated: >60 mL/min (ref >60)
Glucose, Bld: 79 mg/dL (ref 70–99)
Potassium: 3.7 mmol/L (ref 3.5–5.1)
Sodium: 137 mmol/L (ref 135–145)
Total Bilirubin: 0.5 mg/dL (ref 0.0–1.2)
Total Protein: 6 g/dL — ABNORMAL LOW (ref 6.5–8.1)

## 2023-12-09 LAB — CBC WITH DIFFERENTIAL (CANCER CENTER ONLY)
Abs Immature Granulocytes: 0.01 10*3/uL (ref 0.00–0.07)
Basophils Absolute: 0.1 10*3/uL (ref 0.0–0.1)
Basophils Relative: 1 %
Eosinophils Absolute: 0.9 10*3/uL — ABNORMAL HIGH (ref 0.0–0.5)
Eosinophils Relative: 13 %
HCT: 39.5 % (ref 39.0–52.0)
Hemoglobin: 12.5 g/dL — ABNORMAL LOW (ref 13.0–17.0)
Immature Granulocytes: 0 %
Lymphocytes Relative: 21 %
Lymphs Abs: 1.4 10*3/uL (ref 0.7–4.0)
MCH: 25.9 pg — ABNORMAL LOW (ref 26.0–34.0)
MCHC: 31.6 g/dL (ref 30.0–36.0)
MCV: 81.8 fL (ref 80.0–100.0)
Monocytes Absolute: 0.5 10*3/uL (ref 0.1–1.0)
Monocytes Relative: 8 %
Neutro Abs: 3.8 10*3/uL (ref 1.7–7.7)
Neutrophils Relative %: 57 %
Platelet Count: 176 10*3/uL (ref 150–400)
RBC: 4.83 MIL/uL (ref 4.22–5.81)
RDW: 15 % (ref 11.5–15.5)
WBC Count: 6.7 10*3/uL (ref 4.0–10.5)
nRBC: 0 % (ref 0.0–0.2)

## 2023-12-09 MED ORDER — CARVEDILOL 3.125 MG PO TABS
3.1250 mg | ORAL_TABLET | Freq: Two times a day (BID) | ORAL | 0 refills | Status: DC
  Filled 2023-12-09: qty 60, 30d supply, fill #0

## 2023-12-09 NOTE — Progress Notes (Signed)
 [facility] [phone]   [phone]  PROGRESS NOTE  Patient Care Team: Connor Rogue, DO as PCP - General [facility], Connor Baumgartner, NP as Nurse Practitioner (Nurse Practitioner)  Hematological/Oncological History # Waldenstrom's Macroglobulinemia # IgM Monoclonal Gammopathy 04/07/2021: CT A/p showed mildly enlarged retroperitoneal lymph nodes and bilateral inguinal lymph nodes.  04/08/2021: IgM Kappa M protein 1.2 04/09/2021: WBC 11.1, Hgb 9.5, MCV 75.4, Plt 525 04/20/2021: Kappa 977, Lambda 7.8, K/L ratio 125.28. Serum viscosity 1.8 05/06/2021: Establish care with Dr. 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 Infusion reaction with ritux, held halfway through.  06/12/2021: Cycle 2 Day 1 of Ibrutinib + Rituximab 06/19/2021: Cycle 3 Day 1 of Ibrutinib + Rituximab 06/26/2021: Cycle 4 Day 1 of rituximab. Continued on daily ibrutinib 12/02/2021: Cycle 5 Day 1 of Ibrutinib + Rituximab 12/09/2021: Cycle 6 Day 1 of Ibrutinib + Rituximab 12/16/2021: Cycle 7 Day 1 of Ibrutinib + Rituximab 12/23/2021: Held Cycle 8 of Ritxumab due to nausea/vomiting, elevated creatinine, finger infection  Interval History:  [facility] 63 y.o. male with medical history significant for Waldenstrom's macroglobulinemia who presents for a follow up visit. The patient's last visit was on 10/10/2023. In the interim since the last visit he has continued on Ibrutinib therapy.   On exam today [facility] reports he has unfortunately developed a head cold.  He is having coughing, increasing phlegm, and headache.  He reports that he is however getting over it.  He reports his pain is under good control and currently at a 5 out of 10.  He reports his energy levels have been "not good".  He reports that he does  have some occasional trouble walking longer distances because becomes short of breath.  He reports recently had an episode became very short of breath and was having palpitations.  He was quite frightened because of this but it did pass.  He reports he has not had any trouble with bleeding, bruising, or dark stools.  His appetite remains good and he is doing his best to stay hydrated.  Unfortunately his wife was in the hospital for about 5 weeks and is now back at home, attempting to regain her strength..   He is willing and able to proceed with ibrutinib therapy. He denies fevers, chills, night sweats, shortness of breath, chest pain or cough. He has no other complaints. Full 10 point ROS is listed below.   MEDICAL HISTORY:  Past Medical History:  Diagnosis Date   Acute heart failure (HCC) 04/08/2021   Acute HFrEF (heart failure with reduced ejection fraction) (HCC) 04/07/2021   Arthritis    hands, elbows bilaterally   Cancer (HCC)    Full dentures    Hypertension    Pt states he doen not have HTN and has never been treated for HTN   Kidney stone on left side last stone 4-5 yrs ago   recurrent (3 episodes)   Panic disorder    pt states has resolved   Syncope    resolved- was related to panic attacks    SURGICAL HISTORY: Past Surgical History:  Procedure Laterality Date   CARPAL METACARPAL FUSION WITH DISTAL RADIAL BONE GRAFT Left 05/30/2013   Procedure: LEFT THUMB METACARPAL JOINT FUSION;  Surgeon: [name], MD;  Location: [facility];  Service: Orthopedics;  Laterality: Left;   ESOPHAGOGASTRODUODENOSCOPY N/A  02/12/2014   Procedure: ESOPHAGOGASTRODUODENOSCOPY (EGD);  Surgeon: [name], MD;  Location: [facility];  Service: Endoscopy;  Laterality: N/A;   FOREIGN BODY REMOVAL N/A 02/12/2014   Procedure: FOREIGN BODY REMOVAL;  Surgeon: [name], MD;  Location[facility] END[facility]COPY;  Service: Endoscopy;  Laterality: N/A;   OPEN REDUCTION INTERNAL FIXATION (ORIF)  DISTAL RADIAL FRACTURE Left 11/29/2012   Procedure: OPEN REDUCTION INTERNAL FIXATION (ORIF) DISTAL RADIAL FRACTURE;  Surgeon: [name], MD;  Location: [facility];  Service: Orthopedics;  Laterality: Left;  LEFT DISTAL RADIUS OSTEOTOMY WITH BONE GRAFT   RIGHT/LEFT HEART CATH AND CORONARY ANGIOGRAPHY N/A 04/09/2021   Procedure: RIGHT/LEFT HEART CATH AND CORONARY ANGIOGRAPHY;  Surgeon: [name], MD;  Location: [facility];  Service: Cardiovascular;  Laterality: N/A;   TENDON REPAIR Left 02/07/2013   Procedure: LEFT EXTENSOR INDICIS PROPRIUS  TO EXTENSOR POLLICIS LONGUS TENDON TRANSFER;  Surgeon: [name], MD;  Location: [facility];  Service: Orthopedics;  Laterality: Left;   WISDOM TOOTH EXTRACTION     WRIST SURGERY     fx only    SOCIAL HISTORY: Social History   Socioeconomic History   Marital status: Single    Spouse name: Not on file   Number of children: Not on file   Years of education: Not on file   Highest education level: Not on file  Occupational History   Not on file  Tobacco Use   Smoking status: Former    Types: Cigars    Quit date: 10/11/2020    Years since quitting: 3.1   Smokeless tobacco: Never   Tobacco comments:    smokes 1 cigar a week  Vaping Use   Vaping status: Never Used  Substance and Sexual Activity   Alcohol use: No   Drug use: No   Sexual activity: Not on file  Other Topics Concern   Not on file  Social History Narrative   Not on file   Social Drivers of Health   Financial Resource Strain: High Risk (07/17/2021)   Overall Financial Resource Strain (CARDIA)    Difficulty of Paying Living Expenses: Very hard  Food Insecurity: Food Insecurity Present (04/10/2021)   Hunger Vital Sign    Worried About Running Out of Food in the Last Year: Sometimes true    Ran Out of Food in the Last Year: Sometimes true  Transportation Needs: No Transportation Needs (04/10/2021)   PRAPARE - Therapist, art (Medical): No    Lack of Transportation (Non-Medical): No  Physical Activity: Not on file  Stress: Stress Concern Present (09/01/2021)   [employer] of Occupational Health - Occupational Stress Questionnaire    Feeling of Stress : Very much  Social Connections: Not on file  Intimate Partner Violence: Not on file    FAMILY HISTORY: Family History  Problem Relation Age of Onset   Heart attack Father    Sudden Cardiac Death Father 75   Diabetes Neg Hx    Hyperlipidemia Neg Hx    Hypertension Neg Hx     ALLERGIES:  is allergic to rituxan [rituximab] and hydrocodone.  MEDICATIONS:  Current Outpatient Medications  Medication Sig Dispense Refill   acetaminophen (TYLENOL) 500 MG tablet Take 500 mg by mouth every 6 (six) hours as needed. (Patient not taking: Reported on 11/11/2023)     albuterol (PROAIR HFA) 108 (90 Base) MCG/ACT inhaler Inhale 2 puffs into the lungs every 6 (six) hours as  needed for wheezing or shortness of breath. 6.7 g 2   azithromycin (ZITHROMAX Z-PAK) 250 MG tablet Take 2 tablets by mouth on day 1, then 1 tablet daily on days 2 to 5 6 each 0   carvedilol (COREG) 3.125 MG tablet Take 1 tablet (3.125 mg total) by mouth 2 (two) times daily. NEEDS FOLLOW UP APPOINTMENT FOR MORE REFILLS 60 tablet 0   chlorhexidine (HIBICLENS) 4 % external liquid Apply topically daily as needed. Dilute with water and clean wound with this.  Rinse thoroughly afterwards 237 mL 0   clobetasol cream (TEMOVATE) 0.05 % Apply 1 Application topically 2 (two) times daily. Apply topically to the arms and hands twice daily for up to two weeks 30 g 2   dapagliflozin propanediol (FARXIGA) 10 MG TABS tablet Take 1 tablet (10 mg total) by mouth daily before breakfast. NEEDS FOLLOW UP APPOINTMENT FOR ANYMORE REFILLS 30 tablet 3   diazepam (VALIUM) 5 MG tablet Take 1 tablet (5 mg total) by mouth every 12 (twelve) hours as needed for anxiety. 60 tablet 0   dronabinol (MARINOL) 5  MG capsule Take 1 capsule (5 mg total) by mouth 2 (two) times daily before a meal. 60 capsule 0   DULoxetine (CYMBALTA) 30 MG capsule Take 1 capsule (30 mg total) by mouth daily. 30 capsule 3   eplerenone (INSPRA) 25 MG tablet Take 0.5 tablets (12.5 mg total) by mouth daily. NEEDS FOLLOW UP APPOINTMENT FOR MORE REFILLS 15 tablet 3   ferrous sulfate (FEROSUL) 325 (65 FE) MG tablet Take 1 tablet by mouth daily with breakfast. Please take with a source of Vitamin C 90 tablet 3   ibrutinib (IMBRUVICA) 420 MG tablet Take 1 tablet (420 mg total) by mouth daily. 28 tablet 2   methadone (DOLOPHINE) 10 MG tablet Take 1 tablet (10 mg total) by mouth every 8 (eight) hours. 90 tablet 0   naloxone (NARCAN) nasal spray 4 mg/0.1 mL Take for overdose 1 each 0   naloxone (NARCAN) nasal spray 4 mg/0.1 mL Take for overdose as directed 2 each 0   ondansetron (ZOFRAN) 8 MG tablet Take 1 tablet (8 mg) by mouth every 8 hours as needed. 30 tablet 0   Oxycodone HCl 10 MG TABS Take 1 tablet (10 mg total) by mouth every 4 (four) hours as needed. 90 tablet 0   potassium chloride SA (KLOR-CON M) 20 MEQ tablet Take 1 tablet (20 mEq total) by mouth daily. 30 tablet 1   sacubitril-valsartan (ENTRESTO) 24-26 MG Take 1 tablet by mouth 2 (two) times daily. 180 tablet 3   sildenafil (VIAGRA) 50 MG tablet Take 1 tablet (50 mg total) by mouth daily as needed for erectile dysfunction. 20 tablet 0   No current facility-administered medications for this visit.    REVIEW OF SYSTEMS:   Constitutional: ( - ) fevers, ( - )  chills , ( - ) night sweats Eyes: ( - ) blurriness of vision, ( - ) double vision, ( - ) watery eyes Ears, nose, mouth, throat, and face: ( - ) mucositis, ( - ) sore throat Respiratory: ( - ) cough, ( - ) dyspnea, ( - ) wheezes Cardiovascular: ( - ) palpitation, ( - ) chest discomfort, ( - ) lower extremity swelling Gastrointestinal:  (- ) nausea, ( - ) heartburn, ( - ) change in bowel habits Skin: ( - ) abnormal  skin rashes Lymphatics: ( - ) new lymphadenopathy, ( - ) easy bruising Neurological: ( - ) numbness, ( - )  tingling, ( - ) new weaknesses Behavioral/Psych: ( - ) mood change, ( - ) new changes  All other systems were reviewed with the patient and are negative.  PHYSICAL EXAMINATION: ECOG PERFORMANCE STATUS: 2 - Symptomatic, <50% confined to bed  Vitals:   12/09/23 1144  BP: 130/85  Pulse: 72  Resp: 14  Temp: 98.1 F (36.7 C)  SpO2: 100%     Filed Weights   12/09/23 1144  Weight: 130 lb 6.4 oz (59.1 kg)      GENERAL: Chronically ill appearing middle-aged Caucasian male, alert, no distress and comfortable SKIN: Dry cracking rash of hands bilaterally.  No issues on the chest, face, or lower extremities.  Skin color, texture, turgor are normal, no rashes or significant lesions.  EYES: conjunctiva are pink and non-injected, sclera clear LUNGS: clear to auscultation and percussion with normal breathing effort HEART: regular rate & rhythm and no murmurs and no lower extremity edema  MSK: Bilateral edema of hands.  PSYCH: alert & oriented x 3, fluent speech NEURO: no focal motor/sensory deficits   LABORATORY DATA:  I have reviewed the data as listed    Latest Ref Rng & Units 12/09/2023   11:03 AM 11/11/2023    8:22 AM 10/10/2023   10:49 AM  CBC  WBC 4.0 - 10.5 K/uL 6.7  6.5  12.2   Hemoglobin 13.0 - 17.0 g/dL 16.1  09.6  04.5   Hematocrit 39.0 - 52.0 % 39.5  40.4  35.3   Platelets 150 - 400 K/uL 176  187  361        Latest Ref Rng & Units 12/09/2023   11:03 AM 11/11/2023    8:22 AM 10/10/2023   10:49 AM  CMP  Glucose 70 - 99 mg/dL 79  87  409   BUN 8 - 23 mg/dL 21  17  16    Creatinine 0.61 - 1.24 mg/dL 8.11  9.14  7.82   Sodium 135 - 145 mmol/L 137  142  138   Potassium 3.5 - 5.1 mmol/L 3.7  3.5  4.2   Chloride 98 - 111 mmol/L 105  109  104   CO2 22 - 32 mmol/L 26  26  27    Calcium 8.9 - 10.3 mg/dL 8.7  8.9  8.2   Total Protein 6.5 - 8.1 g/dL 6.0  5.7  5.6    Total Bilirubin 0.0 - 1.2 mg/dL 0.5  0.5  0.4   Alkaline Phos 38 - 126 U/L 61  63  77   AST 15 - 41 U/L 16  14  11    ALT 0 - 44 U/L 7  6  6      Lab Results  Component Value Date   MPROTEIN 0.1 (H) 11/11/2023   MPROTEIN 0.1 (H) 09/12/2023   MPROTEIN 0.1 (H) 08/15/2023   Lab Results  Component Value Date   KPAFRELGTCHN 26.1 (H) 11/11/2023   KPAFRELGTCHN 29.3 (H) 09/12/2023   KPAFRELGTCHN 22.2 (H) 08/15/2023   LAMBDASER 8.9 11/11/2023   LAMBDASER 7.1 09/12/2023   LAMBDASER 4.7 (L) 08/15/2023   KAPLAMBRATIO 2.93 (H) 11/11/2023   KAPLAMBRATIO 4.13 (H) 09/12/2023   KAPLAMBRATIO 4.72 (H) 08/15/2023    RADIOGRAPHIC STUDIES: No results found.  ASSESSMENT & PLAN [facility] 63 y.o. male with medical history significant for Waldenstrom's macroglobulinemia who presents for a follow up visit.   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  There is no clear evidence of hyperviscosity syndrome at this time.  At this time we will plan to proceed with ibrutinib and rituximab therapy.  One could also consider Bendamustine and rituximab therapy but given the patient's degree of anemia I would prefer pursuing ibrutinib and rituximab at this time.  Additionally ibrutinib/rituximab are category 1 recommendations per the NCCN guidelines.  The treatment will consist of ibrutinib 420 mg p.o. daily with rituximab on weeks 1 through 4 as well as weeks 27 through 30.  Previously we discussed the risks and benefits of therapy including (but not limited to) risk of bleeding, fatigue, atypical infections, and cardiac arrhythmias.  The patient voices understanding of this plan moving forward.   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 --Started second round of weekly rituximab on 12/02/2021. Continued weekly x 4 weeks.   --last rituximab dose held due to missed visits/illness.  Plan: --Labs today were reviewed without any intervention needed.  Labs show white cell count 6.7, hemoglobin 12.5, MCV 81.8, platelets 176. Pending SPEP and sFLC levels but prior levels appear stable. --Currently on ibrutinib 420 mg PO daily.  --RTC in 4 weeks with labs   # Lesion on Back Of Head- resolved  -- Patient had a ping-pong ball sized lesion on the back of his head.  It appeared to be draining and has some dried blood and crusting. -- Patient evaluated by [facility] in dermatology.  Lesion was biopsied and found not to be cancerous. -- Appears to be healing well and improving greatly.  Continue per [facility] recommendations.  # Rash-improved markedly -- Affecting hands bilaterally, upper extremities bilaterally, and back.  Not affecting face, chest, or lower extremities. -- Patient has dryness and cracking as well as erythema on the upper extremities. -- Etiology is unclear.  Patient is using hydrating creams but is not effective.  Made referral to dermatology for evaluation, he is established with [facility].  #Pain Management --Pain was poorly controlled on hydrocodone therapy previously.  --Patient was previously admitted for for opioid overdose with presumed fentanyl --Currently pain regimen includes methadone 10 mg TID and oxycodone 10 mg PRN for breakthrough pain per palliative care. Patient reports pain is worsening and admits to taking double dose of oxycodone occasionally. Rates pain as 7/10. -- Appreciate recommendations of palliative care.  #Normocytic Anemia  --Hgb 12.5, improved --previously there was a component of iron deficiency anemia as  well.  Currently on PO ferrous sulfate. --Continue to monitor.  #Nausea and vomiting--stable -- Concern that the nausea and vomiting may not be related to the ibrutinib therapy and potentially tied to poor gastric motility due to opioid prescription.  --Unable to tolerate olanzopine so discontinued --Not taking metoclopramide 10 mg every 8 hours  --continue zofran PRN.   #Fatigue #Anxiety/Depression: --Fatigue and depression have worsened recently --Anxiety has improved with Valium  --Will request further evaluation by palliative care team  #Supportive Care -- chemotherapy education complete -- port placement not required   No orders of the defined types were placed in this encounter.  All questions were answered. The patient knows to call the clinic with any problems, questions or concerns.  I have spent a total of 30 minutes minutes of face-to-face and  non-face-to-face time, preparing to see the patient,  performing a medically appropriate examination, counseling and educating the patient, ordering medications/tests, referring and communicating with other health care professionals, documenting clinical information in the electronic health record, and care coordination.   Connor Barns, MD Department of Hematology/Oncology [facility] Phone: [phone] Pager: [phone] Email: [email or web address]    12/10/2023 6:42 PM  Dimopoulos MA, Lennette Bihari, Trotman Joycelyn Rua, Mahe B, Herbaux C, Tam C, Orsucci L, Palomba ML, Matous JV, Chackbay C, Kastritis E, Jersey, Li J, Salman Z, Graef T, Buske C; iNNOVATE Study Group and the [employer]. Phase 3 Trial of Ibrutinib plus Rituximab in Waldenstrm's Macroglobulinemia. [facility] Mis J Med. 2018 Jun 21;378(25):2399-2410.   --At 30 months, the progression-free survival rate was 82% with ibrutinib-rituximab versus 28% with  placebo-rituximab (hazard ratio for progression or death, 0.20; P<0.001).

## 2023-12-11 ENCOUNTER — Telehealth: Payer: Self-pay | Admitting: Cardiology

## 2023-12-11 NOTE — Telephone Encounter (Signed)
 Received a call from a paramedic who was at the home of [name].  Patient has been having issues with chest pain and shortness of breath.  His wife is concerned about his symptoms so she decided to call EMS.  When EMS arrived, found his heart rate to be in the 150s.  EMS has attempted to start IV fluids to see if heart rate would improve, heart rate has not yet improved.  EMS recommended that he go to the emergency department for further care but patient has been refusing.  Patient reportedly told EMS that he would not go to the hospital without approval from his cardiology practice.  Patient has a known history of heart failure with EF 25% on his last echo.  I informed patient that having heart rate that is sustaining in the 150s is very dangerous.  I told patient that with his history of heart failure, his elevated heart rate, his symptoms of chest pain and shortness of breath, I am concerned that he could very well pass away if he does not seek immediate care.  Patient is refusing to go to the ED because "he does not want to".  He asked me if he could just stay still at home and not move around much.  He has already been sitting still for the past 30 minutes and his heart rate has not improved.  Again, I emphasized that in this situation it is important to seek care in the emergency department as he is at very high risk of decompensating, losing consciousness, or even death.  Patient continues to refuse to go to the ED.  Paramedics remained on the scene and continue to encourage him to go to the ED.  Patient's family is in support of him going to the emergency department as well.  Jonita Albee, PA-C 12/11/2023 2:45 PM

## 2023-12-12 LAB — KAPPA/LAMBDA LIGHT CHAINS
Kappa free light chain: 24.5 mg/L — ABNORMAL HIGH (ref 3.3–19.4)
Kappa, lambda light chain ratio: 3.02 — ABNORMAL HIGH (ref 0.26–1.65)
Lambda free light chains: 8.1 mg/L (ref 5.7–26.3)

## 2023-12-13 ENCOUNTER — Other Ambulatory Visit (HOSPITAL_COMMUNITY): Payer: Self-pay

## 2023-12-13 ENCOUNTER — Other Ambulatory Visit: Payer: Self-pay | Admitting: Nurse Practitioner

## 2023-12-13 DIAGNOSIS — C8800 Waldenstrom macroglobulinemia not having achieved remission: Secondary | ICD-10-CM

## 2023-12-13 DIAGNOSIS — G893 Neoplasm related pain (acute) (chronic): Secondary | ICD-10-CM

## 2023-12-13 DIAGNOSIS — Z515 Encounter for palliative care: Secondary | ICD-10-CM

## 2023-12-13 LAB — MULTIPLE MYELOMA PANEL, SERUM
Albumin SerPl Elph-Mcnc: 3.5 g/dL (ref 2.9–4.4)
Albumin/Glob SerPl: 1.8 — ABNORMAL HIGH (ref 0.7–1.7)
Alpha 1: 0.2 g/dL (ref 0.0–0.4)
Alpha2 Glob SerPl Elph-Mcnc: 0.6 g/dL (ref 0.4–1.0)
B-Globulin SerPl Elph-Mcnc: 0.7 g/dL (ref 0.7–1.3)
Gamma Glob SerPl Elph-Mcnc: 0.4 g/dL (ref 0.4–1.8)
Globulin, Total: 2 g/dL — ABNORMAL LOW (ref 2.2–3.9)
IgA: 45 mg/dL — ABNORMAL LOW (ref 61–437)
IgG (Immunoglobin G), Serum: 390 mg/dL — ABNORMAL LOW (ref 603–1613)
IgM (Immunoglobulin M), Srm: 98 mg/dL (ref 20–172)
M Protein SerPl Elph-Mcnc: 0.1 g/dL — ABNORMAL HIGH
Total Protein ELP: 5.5 g/dL — ABNORMAL LOW (ref 6.0–8.5)

## 2023-12-13 MED ORDER — OXYCODONE HCL 10 MG PO TABS
10.0000 mg | ORAL_TABLET | ORAL | 0 refills | Status: DC | PRN
  Filled 2023-12-14: qty 90, 15d supply, fill #0

## 2023-12-13 NOTE — Progress Notes (Deleted)
 Palliative Medicine [facility]  [phone] [phone]   Name: Connor Solomon Date: 12/13/2023 MRN: [id number]  DOB: [birth year]  Patient Care Team: [name], DO as PCP - General Pickenpack-Cousar, [name], NP as Nurse Practitioner (Nurse Practitioner)   I connected with Connor Solomon on 12/13/23 at 11:30 AM EST by phone and verified that I am speaking with the correct person using two identifiers.   I discussed the limitations, risks, security and privacy concerns of performing an evaluation and management service by telemedicine and the availability of in-person appointments. I also discussed with the patient that there may be a patient responsible charge related to this service. The patient expressed understanding and agreed to proceed.   Other persons participating in the visit and their role in the encounter: n/a   Patient's location: home  Provider's location: [facility]   Chief Complaint: f/u of symptom management  REASON FOR CONSULTATION: Connor Solomon is a 63 y.o. 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 Palliative ask to see for symptom management.  SOCIAL HISTORY:     reports that he quit smoking about 3 years ago. His smoking use included cigars. He has never used smokeless tobacco. He reports that he does not drink alcohol and does not use drugs.  ADVANCE DIRECTIVES:    CODE STATUS:   PAST MEDICAL HISTORY: Past Medical History:  Diagnosis Date   Acute heart failure (HCC) 04/08/2021   Acute HFrEF (heart failure with reduced ejection fraction) (HCC) 04/07/2021   Arthritis    hands, elbows bilaterally   Cancer (HCC)    Full dentures    Hypertension    Pt states he doen not have HTN and has never been treated for HTN   Kidney stone on left side last stone 4-5 yrs ago   recurrent (3 episodes)   Panic disorder    pt states has  resolved   Syncope    resolved- was related to panic attacks    PAST SURGICAL HISTORY:  Past Surgical History:  Procedure Laterality Date   CARPAL METACARPAL FUSION WITH DISTAL RADIAL BONE GRAFT Left 05/30/2013   Procedure: LEFT THUMB METACARPAL JOINT FUSION;  Surgeon: [name], MD;  Location: [facility];  Service: Orthopedics;  Laterality: Left;   ESOPHAGOGASTRODUODENOSCOPY N/A 02/12/2014   Procedure: ESOPHAGOGASTRODUODENOSCOPY (EGD);  Surgeon: [name], MD;  Location: [facility] ENDOSCOPY;  Service: Endoscopy;  Laterality: N/A;   FOREIGN BODY REMOVAL N/A 02/12/2014   Procedure: FOREIGN BODY REMOVAL;  Surgeon: [name], MD;  Location: [facility] ENDOSCOPY;  Service: Endoscopy;  Laterality: N/A;   OPEN REDUCTION INTERNAL FIXATION (ORIF) DISTAL RADIAL FRACTURE Left 11/29/2012   Procedure: OPEN REDUCTION INTERNAL FIXATION (ORIF) DISTAL RADIAL FRACTURE;  Surgeon: [name], MD;  Location: [facility];  Service: Orthopedics;  Laterality: Left;  LEFT DISTAL RADIUS OSTEOTOMY WITH BONE GRAFT   RIGHT/LEFT HEART CATH AND CORONARY ANGIOGRAPHY N/A 04/09/2021   Procedure: RIGHT/LEFT HEART CATH AND CORONARY ANGIOGRAPHY;  Surgeon: [name], MD;  Location: [facility] INVASIVE CV LAB;  Service: Cardiovascular;  Laterality: N/A;   TENDON REPAIR Left 02/07/2013   Procedure: LEFT EXTENSOR INDICIS PROPRIUS  TO EXTENSOR POLLICIS LONGUS TENDON TRANSFER;  Surgeon: [name], MD;  Location: [facility];  Service: Orthopedics;  Laterality: Left;   WISDOM TOOTH EXTRACTION     WRIST SURGERY     fx  only    HEMATOLOGY/ONCOLOGY HISTORY:  Oncology History  Waldenstrom macroglobulinemia  05/24/2021 Initial Diagnosis   Waldenstrom macroglobulinemia (HCC)   06/05/2021 - 12/30/2021 Chemotherapy   Patient is on Treatment Plan : NON-HODGKINS LYMPHOMA Rituximab q7d       ALLERGIES:  is allergic to rituxan [rituximab] and hydrocodone.  MEDICATIONS:  Current  Outpatient Medications  Medication Sig Dispense Refill   acetaminophen (TYLENOL) 500 MG tablet Take 500 mg by mouth every 6 (six) hours as needed. (Patient not taking: Reported on 11/11/2023)     albuterol (PROAIR HFA) 108 (90 Base) MCG/ACT inhaler Inhale 2 puffs into the lungs every 6 (six) hours as needed for wheezing or shortness of breath. 6.7 g 2   azithromycin (ZITHROMAX Z-PAK) 250 MG tablet Take 2 tablets by mouth on day 1, then 1 tablet daily on days 2 to 5 6 each 0   carvedilol (COREG) 3.125 MG tablet Take 1 tablet (3.125 mg total) by mouth 2 (two) times daily. NEEDS FOLLOW UP APPOINTMENT FOR MORE REFILLS 60 tablet 0   chlorhexidine (HIBICLENS) 4 % external liquid Apply topically daily as needed. Dilute with water and clean wound with this.  Rinse thoroughly afterwards 237 mL 0   clobetasol cream (TEMOVATE) 0.05 % Apply 1 Application topically 2 (two) times daily. Apply topically to the arms and hands twice daily for up to two weeks 30 g 2   dapagliflozin propanediol (FARXIGA) 10 MG TABS tablet Take 1 tablet (10 mg total) by mouth daily before breakfast. NEEDS FOLLOW UP APPOINTMENT FOR ANYMORE REFILLS 30 tablet 3   diazepam (VALIUM) 5 MG tablet Take 1 tablet (5 mg total) by mouth every 12 (twelve) hours as needed for anxiety. 60 tablet 0   dronabinol (MARINOL) 5 MG capsule Take 1 capsule (5 mg total) by mouth 2 (two) times daily before a meal. 60 capsule 0   DULoxetine (CYMBALTA) 30 MG capsule Take 1 capsule (30 mg total) by mouth daily. 30 capsule 3   eplerenone (INSPRA) 25 MG tablet Take 0.5 tablets (12.5 mg total) by mouth daily. NEEDS FOLLOW UP APPOINTMENT FOR MORE REFILLS 15 tablet 3   ferrous sulfate (FEROSUL) 325 (65 FE) MG tablet Take 1 tablet by mouth daily with breakfast. Please take with a source of Vitamin C 90 tablet 3   ibrutinib (IMBRUVICA) 420 MG tablet Take 1 tablet (420 mg total) by mouth daily. 28 tablet 2   methadone (DOLOPHINE) 10 MG tablet Take 1 tablet (10 mg total) by  mouth every 8 (eight) hours. 90 tablet 0   naloxone (NARCAN) nasal spray 4 mg/0.1 mL Take for overdose 1 each 0   naloxone (NARCAN) nasal spray 4 mg/0.1 mL Take for overdose as directed 2 each 0   ondansetron (ZOFRAN) 8 MG tablet Take 1 tablet (8 mg) by mouth every 8 hours as needed. 30 tablet 0   [START ON 12/14/2023] Oxycodone HCl 10 MG TABS Take 1 tablet (10 mg total) by mouth every 4 (four) hours as needed. 90 tablet 0   potassium chloride SA (KLOR-CON M) 20 MEQ tablet Take 1 tablet (20 mEq total) by mouth daily. 30 tablet 1   sacubitril-valsartan (ENTRESTO) 24-26 MG Take 1 tablet by mouth 2 (two) times daily. 180 tablet 3   sildenafil (VIAGRA) 50 MG tablet Take 1 tablet (50 mg total) by mouth daily as needed for erectile dysfunction. 20 tablet 0   No current facility-administered medications for this visit.    VITAL SIGNS: There were no vitals  taken for this visit. There were no vitals filed for this visit.     Estimated body mass index is 19.83 kg/m as calculated from the following:   Height as of 11/11/23: 5\' 8"  (1.727 m).   Weight as of 12/09/23: 130 lb 6.4 oz (59.1 kg).   PERFORMANCE STATUS (ECOG) : 1 - Symptomatic but completely ambulatory  Assessment NAD RRR Normal breathing pattern  AAO X4  IMPRESSION:  Connor Solomon presents to clinic for with concerns of worsening joint pain. Denies nausea, vomiting, constipation, or diarrhea.   He experiences worsening joint and bone pain, particularly in the hips, spine, shin bone, and knees. The pain is described as a 'different kind of pain' and has been progressively worsening over time. He is currently on methadone and oxycodone for pain management but feels these medications are becoming less effective. He also takes Tylenol and is allergic to hydrocodone, which causes itching. Despite taking methadone as prescribed, he notices no significant relief and is concerned about the effectiveness of his pain management regimen.  He is on  Imbruvica for Waldenstrom's macroglobulinemia, which is stable. He is concerned about the pain being related to his condition but notes that recent lab results indicate stability. Connor Solomon discusses significant stressors, including his wife's severe illness, financial stress due to car repairs, and issues with Medicare and Medicaid affecting his income. He is worried about the impact of these stressors on his health and pain levels.   We discussed Blakely's increased pain and possible etiology including arthritic. He states he is taking Tylenol several times a day with minimal relief. Education provided on use of current medication including methadone and oxycodone. He is aware no changes will be made to this regimen. Solomon use of Imbruvica will stay away from NSAIDS at this time. Education provided on addition of Cymbalta to assist with pain management including use, administration, efficacy, and possible side effects. Patient verbalized understanding.   All questions answered and support provided.   Goals of care   7/10: I created space and opportunity for Connor Solomon to share his thoughts and feelings regarding his current health and condition. He is realistic speaking to his understanding of Christian faith. He is taking things one day at a time preparing himself for when his time comes. Until then he wishes to continue treating the treatable allowing himself every opportunity to thrive. He knows at anytime he can discuss with his medical team his wishes and focus on his comfort.   (5/16) Connor Solomon shares his realistic understanding of his condition. He and his wife are making sure all "affairs" are in order as his biggest worry is leaving his wife in a financial constraint. Reports communicating with his life insurance policy etc. Emotional support provided.    He is emotional expressing racing thoughts during idol time or interruptions in his sleep as his mind wonders thinking about worst case  scenario. Is requesting something to assist with sleeping and anxiety. Acknowledged his request. Education provided on limited use in collaboration with other medications including zyprexa. He verbalized understanding and aware  we will continue to support and monitor for needs.   Assessment and Plan  Chronic Cancer Related Pain Increased joint and bone pain despite stable Waldenstrom's. Currently on Methadone and Oxycodone with limited relief. Patient reports possible tolerance to medications. Education provided on tolerance and taking medications as prescribed. He is aware no adjustments will be made to current regimen including his oxycodone or methadone.  -Start Cymbalta 30  mg once daily for additional pain control, monitor for side effects.  -Continue Methadone and Oxycodone, next refill due on February 8th and 12th respectively. -Tylenol ES every 8 hours as needed.  -Plan to call patient next week for follow-up on pain control.  Waldenstrom's Macroglobulinemia Stable condition, currently on Imbruvica. -Continue Imbruvica as prescribed per Oncology.   General Health Maintenance -Plan for regular follow-up appointments as previously scheduled. -I will plan to see patient back in clinic in 4-6 weeks.  Patient expressed understanding and was in agreement with this plan. He also understands that He can call the clinic at any time with any questions, concerns, or complaints.    Any controlled substances utilized were prescribed in the context of palliative care. PDMP has been reviewed.    Visit consisted of counseling and education dealing with the complex and emotionally intense issues of symptom management and palliative care in the setting of serious and potentially life-threatening illness.  Willette Alma, AGPCNP-BC  Palliative Medicine Team/[facility]

## 2023-12-14 ENCOUNTER — Other Ambulatory Visit (HOSPITAL_COMMUNITY): Payer: Self-pay

## 2023-12-14 ENCOUNTER — Encounter: Payer: Self-pay | Admitting: Nurse Practitioner

## 2023-12-14 ENCOUNTER — Inpatient Hospital Stay: Payer: Medicare (Managed Care) | Admitting: Nurse Practitioner

## 2023-12-20 ENCOUNTER — Other Ambulatory Visit: Payer: Self-pay | Admitting: Nurse Practitioner

## 2023-12-20 ENCOUNTER — Other Ambulatory Visit: Payer: Self-pay | Admitting: Hematology and Oncology

## 2023-12-20 DIAGNOSIS — G893 Neoplasm related pain (acute) (chronic): Secondary | ICD-10-CM

## 2023-12-20 DIAGNOSIS — Z515 Encounter for palliative care: Secondary | ICD-10-CM

## 2023-12-20 DIAGNOSIS — C8800 Waldenstrom macroglobulinemia not having achieved remission: Secondary | ICD-10-CM

## 2023-12-20 DIAGNOSIS — R634 Abnormal weight loss: Secondary | ICD-10-CM

## 2023-12-20 DIAGNOSIS — R630 Anorexia: Secondary | ICD-10-CM

## 2023-12-21 ENCOUNTER — Other Ambulatory Visit: Payer: Self-pay

## 2023-12-21 ENCOUNTER — Other Ambulatory Visit (HOSPITAL_COMMUNITY): Payer: Self-pay

## 2023-12-21 MED ORDER — METHADONE HCL 10 MG PO TABS
10.0000 mg | ORAL_TABLET | Freq: Three times a day (TID) | ORAL | 0 refills | Status: DC
  Filled 2023-12-21 – 2023-12-22 (×2): qty 90, 30d supply, fill #0

## 2023-12-21 MED ORDER — DRONABINOL 5 MG PO CAPS
5.0000 mg | ORAL_CAPSULE | Freq: Two times a day (BID) | ORAL | 0 refills | Status: DC
  Filled 2023-12-21: qty 60, 30d supply, fill #0

## 2023-12-22 ENCOUNTER — Other Ambulatory Visit (HOSPITAL_COMMUNITY): Payer: Self-pay

## 2023-12-22 ENCOUNTER — Other Ambulatory Visit: Payer: Self-pay

## 2023-12-24 ENCOUNTER — Other Ambulatory Visit: Payer: Self-pay | Admitting: Nurse Practitioner

## 2023-12-24 DIAGNOSIS — C8800 Waldenstrom macroglobulinemia not having achieved remission: Secondary | ICD-10-CM

## 2023-12-24 DIAGNOSIS — Z515 Encounter for palliative care: Secondary | ICD-10-CM

## 2023-12-24 DIAGNOSIS — G893 Neoplasm related pain (acute) (chronic): Secondary | ICD-10-CM

## 2023-12-26 ENCOUNTER — Other Ambulatory Visit (HOSPITAL_COMMUNITY): Payer: Self-pay

## 2023-12-26 MED ORDER — OXYCODONE HCL 10 MG PO TABS
10.0000 mg | ORAL_TABLET | ORAL | 0 refills | Status: DC | PRN
Start: 2023-12-27 — End: 2024-01-10
  Filled 2023-12-27: qty 90, 15d supply, fill #0

## 2023-12-27 ENCOUNTER — Other Ambulatory Visit (HOSPITAL_COMMUNITY): Payer: Self-pay

## 2023-12-27 ENCOUNTER — Other Ambulatory Visit (HOSPITAL_BASED_OUTPATIENT_CLINIC_OR_DEPARTMENT_OTHER): Payer: Self-pay

## 2024-01-02 ENCOUNTER — Other Ambulatory Visit: Payer: Self-pay | Admitting: Nurse Practitioner

## 2024-01-02 ENCOUNTER — Other Ambulatory Visit (HOSPITAL_COMMUNITY): Payer: Self-pay

## 2024-01-02 DIAGNOSIS — Z515 Encounter for palliative care: Secondary | ICD-10-CM

## 2024-01-02 DIAGNOSIS — G4700 Insomnia, unspecified: Secondary | ICD-10-CM

## 2024-01-02 DIAGNOSIS — G893 Neoplasm related pain (acute) (chronic): Secondary | ICD-10-CM

## 2024-01-02 DIAGNOSIS — C8800 Waldenstrom macroglobulinemia not having achieved remission: Secondary | ICD-10-CM

## 2024-01-02 MED ORDER — DIAZEPAM 5 MG PO TABS
5.0000 mg | ORAL_TABLET | Freq: Two times a day (BID) | ORAL | 0 refills | Status: DC | PRN
  Filled 2024-01-02 – 2024-01-30 (×2): qty 60, 30d supply, fill #0

## 2024-01-03 ENCOUNTER — Telehealth: Payer: Self-pay | Admitting: *Deleted

## 2024-01-03 ENCOUNTER — Inpatient Hospital Stay: Payer: Medicare (Managed Care) | Admitting: Nurse Practitioner

## 2024-01-03 ENCOUNTER — Inpatient Hospital Stay: Payer: Medicare (Managed Care) | Attending: Hematology and Oncology

## 2024-01-03 ENCOUNTER — Inpatient Hospital Stay: Payer: Medicare (Managed Care) | Admitting: Hematology and Oncology

## 2024-01-03 NOTE — Telephone Encounter (Signed)
 Returned PC to patient, he states he was very late for his appointment so he decided not to come.  Informed him our scheduling department will contact him to reschedule.  He verbalizes understanding.  Scheduling message sent.

## 2024-01-03 NOTE — Progress Notes (Signed)
 Hospitalized, to be rescheduled

## 2024-01-04 ENCOUNTER — Encounter (HOSPITAL_COMMUNITY): Payer: Self-pay

## 2024-01-04 ENCOUNTER — Inpatient Hospital Stay (HOSPITAL_COMMUNITY)
Admission: EM | Admit: 2024-01-04 | Discharge: 2024-01-10 | DRG: 291 | Disposition: A | Discharge status: Home / Self Care | Payer: Medicare (Managed Care) | Attending: Internal Medicine | Admitting: Internal Medicine

## 2024-01-04 ENCOUNTER — Observation Stay (HOSPITAL_COMMUNITY): Payer: Medicare (Managed Care)

## 2024-01-04 ENCOUNTER — Observation Stay (HOSPITAL_BASED_OUTPATIENT_CLINIC_OR_DEPARTMENT_OTHER): Payer: Medicare (Managed Care)

## 2024-01-04 ENCOUNTER — Other Ambulatory Visit: Payer: Self-pay

## 2024-01-04 ENCOUNTER — Emergency Department (HOSPITAL_COMMUNITY): Payer: Medicare (Managed Care)

## 2024-01-04 DIAGNOSIS — I429 Cardiomyopathy, unspecified: Secondary | ICD-10-CM | POA: Diagnosis not present

## 2024-01-04 DIAGNOSIS — R079 Chest pain, unspecified: Secondary | ICD-10-CM | POA: Diagnosis not present

## 2024-01-04 DIAGNOSIS — I5023 Acute on chronic systolic (congestive) heart failure: Secondary | ICD-10-CM | POA: Insufficient documentation

## 2024-01-04 DIAGNOSIS — R0902 Hypoxemia: Secondary | ICD-10-CM | POA: Diagnosis not present

## 2024-01-04 DIAGNOSIS — R0602 Shortness of breath: Secondary | ICD-10-CM | POA: Diagnosis not present

## 2024-01-04 DIAGNOSIS — R609 Edema, unspecified: Secondary | ICD-10-CM | POA: Diagnosis not present

## 2024-01-04 DIAGNOSIS — C8800 Waldenstrom macroglobulinemia not having achieved remission: Secondary | ICD-10-CM | POA: Diagnosis present

## 2024-01-04 DIAGNOSIS — Z885 Allergy status to narcotic agent status: Secondary | ICD-10-CM

## 2024-01-04 DIAGNOSIS — I471 Supraventricular tachycardia, unspecified: Secondary | ICD-10-CM | POA: Diagnosis not present

## 2024-01-04 DIAGNOSIS — E872 Acidosis, unspecified: Secondary | ICD-10-CM | POA: Diagnosis present

## 2024-01-04 DIAGNOSIS — R7989 Other specified abnormal findings of blood chemistry: Secondary | ICD-10-CM | POA: Insufficient documentation

## 2024-01-04 DIAGNOSIS — R531 Weakness: Secondary | ICD-10-CM | POA: Diagnosis not present

## 2024-01-04 DIAGNOSIS — I499 Cardiac arrhythmia, unspecified: Secondary | ICD-10-CM | POA: Diagnosis not present

## 2024-01-04 DIAGNOSIS — I110 Hypertensive heart disease with heart failure: Principal | ICD-10-CM | POA: Diagnosis present

## 2024-01-04 DIAGNOSIS — Z8249 Family history of ischemic heart disease and other diseases of the circulatory system: Secondary | ICD-10-CM | POA: Diagnosis not present

## 2024-01-04 DIAGNOSIS — Z79891 Long term (current) use of opiate analgesic: Secondary | ICD-10-CM | POA: Diagnosis not present

## 2024-01-04 DIAGNOSIS — E871 Hypo-osmolality and hyponatremia: Secondary | ICD-10-CM | POA: Diagnosis present

## 2024-01-04 DIAGNOSIS — Z1152 Encounter for screening for COVID-19: Secondary | ICD-10-CM

## 2024-01-04 DIAGNOSIS — E876 Hypokalemia: Secondary | ICD-10-CM | POA: Diagnosis not present

## 2024-01-04 DIAGNOSIS — I959 Hypotension, unspecified: Secondary | ICD-10-CM | POA: Diagnosis not present

## 2024-01-04 DIAGNOSIS — I428 Other cardiomyopathies: Secondary | ICD-10-CM | POA: Diagnosis present

## 2024-01-04 DIAGNOSIS — I5043 Acute on chronic combined systolic (congestive) and diastolic (congestive) heart failure: Secondary | ICD-10-CM | POA: Insufficient documentation

## 2024-01-04 DIAGNOSIS — J810 Acute pulmonary edema: Principal | ICD-10-CM

## 2024-01-04 DIAGNOSIS — I5021 Acute systolic (congestive) heart failure: Secondary | ICD-10-CM | POA: Diagnosis not present

## 2024-01-04 DIAGNOSIS — M799 Soft tissue disorder, unspecified: Secondary | ICD-10-CM

## 2024-01-04 DIAGNOSIS — Z888 Allergy status to other drugs, medicaments and biological substances status: Secondary | ICD-10-CM

## 2024-01-04 DIAGNOSIS — N179 Acute kidney failure, unspecified: Secondary | ICD-10-CM | POA: Insufficient documentation

## 2024-01-04 DIAGNOSIS — F17290 Nicotine dependence, other tobacco product, uncomplicated: Secondary | ICD-10-CM | POA: Diagnosis present

## 2024-01-04 DIAGNOSIS — Z5986 Financial insecurity: Secondary | ICD-10-CM

## 2024-01-04 DIAGNOSIS — I301 Infective pericarditis: Secondary | ICD-10-CM

## 2024-01-04 DIAGNOSIS — Z79899 Other long term (current) drug therapy: Secondary | ICD-10-CM | POA: Diagnosis not present

## 2024-01-04 DIAGNOSIS — I509 Heart failure, unspecified: Secondary | ICD-10-CM

## 2024-01-04 DIAGNOSIS — F419 Anxiety disorder, unspecified: Secondary | ICD-10-CM | POA: Diagnosis present

## 2024-01-04 DIAGNOSIS — R601 Generalized edema: Secondary | ICD-10-CM

## 2024-01-04 DIAGNOSIS — I10 Essential (primary) hypertension: Secondary | ICD-10-CM | POA: Diagnosis present

## 2024-01-04 DIAGNOSIS — R0781 Pleurodynia: Secondary | ICD-10-CM | POA: Diagnosis not present

## 2024-01-04 DIAGNOSIS — E875 Hyperkalemia: Secondary | ICD-10-CM | POA: Diagnosis present

## 2024-01-04 DIAGNOSIS — G8929 Other chronic pain: Secondary | ICD-10-CM | POA: Diagnosis present

## 2024-01-04 DIAGNOSIS — Z515 Encounter for palliative care: Secondary | ICD-10-CM

## 2024-01-04 DIAGNOSIS — F32A Depression, unspecified: Secondary | ICD-10-CM | POA: Diagnosis present

## 2024-01-04 DIAGNOSIS — I5020 Unspecified systolic (congestive) heart failure: Secondary | ICD-10-CM | POA: Diagnosis not present

## 2024-01-04 DIAGNOSIS — I3139 Other pericardial effusion (noninflammatory): Secondary | ICD-10-CM | POA: Diagnosis not present

## 2024-01-04 LAB — CBC WITH DIFFERENTIAL/PLATELET
Abs Immature Granulocytes: 0.05 10*3/uL (ref 0.00–0.07)
Abs Immature Granulocytes: 0.04 10*3/uL (ref 0.00–0.07)
Basophils Absolute: 0 10*3/uL (ref 0.0–0.1)
Basophils Absolute: 0 10*3/uL (ref 0.0–0.1)
Basophils Relative: 0 %
Basophils Relative: 0 %
Eosinophils Absolute: 0.1 10*3/uL (ref 0.0–0.5)
Eosinophils Absolute: 0 10*3/uL (ref 0.0–0.5)
Eosinophils Relative: 1 %
Eosinophils Relative: 0 %
HCT: 44.3 % (ref 39.0–52.0)
HCT: 45.2 % (ref 39.0–52.0)
Hemoglobin: 13.5 g/dL (ref 13.0–17.0)
Hemoglobin: 13.5 g/dL (ref 13.0–17.0)
Immature Granulocytes: 1 %
Immature Granulocytes: 0 %
Lymphocytes Relative: 10 %
Lymphocytes Relative: 10 %
Lymphs Abs: 1 10*3/uL (ref 0.7–4.0)
Lymphs Abs: 0.9 10*3/uL (ref 0.7–4.0)
MCH: 26 pg (ref 26.0–34.0)
MCH: 25.9 pg — ABNORMAL LOW (ref 26.0–34.0)
MCHC: 30.5 g/dL (ref 30.0–36.0)
MCHC: 29.9 g/dL — ABNORMAL LOW (ref 30.0–36.0)
MCV: 85.2 fL (ref 80.0–100.0)
MCV: 86.6 fL (ref 80.0–100.0)
Monocytes Absolute: 0.6 10*3/uL (ref 0.1–1.0)
Monocytes Absolute: 0.4 10*3/uL (ref 0.1–1.0)
Monocytes Relative: 5 %
Monocytes Relative: 4 %
Neutro Abs: 8.9 10*3/uL — ABNORMAL HIGH (ref 1.7–7.7)
Neutro Abs: 7.8 10*3/uL — ABNORMAL HIGH (ref 1.7–7.7)
Neutrophils Relative %: 83 %
Neutrophils Relative %: 86 %
Platelets: 401 10*3/uL — ABNORMAL HIGH (ref 150–400)
Platelets: 368 10*3/uL (ref 150–400)
RBC: 5.2 MIL/uL (ref 4.22–5.81)
RBC: 5.22 MIL/uL (ref 4.22–5.81)
RDW: 16.8 % — ABNORMAL HIGH (ref 11.5–15.5)
RDW: 16.7 % — ABNORMAL HIGH (ref 11.5–15.5)
WBC: 10.7 10*3/uL — ABNORMAL HIGH (ref 4.0–10.5)
WBC: 9.1 10*3/uL (ref 4.0–10.5)
nRBC: 0 % (ref 0.0–0.2)
nRBC: 0 % (ref 0.0–0.2)

## 2024-01-04 LAB — URINALYSIS, ROUTINE W REFLEX MICROSCOPIC
Bacteria, UA: NONE SEEN
Bilirubin Urine: NEGATIVE
Glucose, UA: NEGATIVE mg/dL
Ketones, ur: NEGATIVE mg/dL
Leukocytes,Ua: NEGATIVE
Nitrite: NEGATIVE
Protein, ur: NEGATIVE mg/dL
Specific Gravity, Urine: 1.004 — ABNORMAL LOW (ref 1.005–1.030)
pH: 5 (ref 5.0–8.0)

## 2024-01-04 LAB — ECHOCARDIOGRAM COMPLETE
AR max vel: 2.11 cm2
AV Area VTI: 1.84 cm2
AV Area mean vel: 1.88 cm2
AV Mean grad: 3 mmHg
AV Peak grad: 5.2 mmHg
Ao pk vel: 1.14 m/s
Area-P 1/2: 3.99 cm2
Calc EF: 18.7 %
Est EF: <20
Height: 68 in
MV M vel: 4.17 m/s
MV Peak grad: 69.4 mmHg
S' Lateral: 6.1 cm
Single Plane A2C EF: 20.9 %
Single Plane A4C EF: 13.9 %
Weight: 2144 [oz_av]

## 2024-01-04 LAB — BASIC METABOLIC PANEL
Anion gap: 10 (ref 5–15)
BUN: 18 mg/dL (ref 8–23)
CO2: 19 mmol/L — ABNORMAL LOW (ref 22–32)
Calcium: 6.5 mg/dL — ABNORMAL LOW (ref 8.9–10.3)
Chloride: 112 mmol/L — ABNORMAL HIGH (ref 98–111)
Creatinine, Ser: 0.97 mg/dL (ref 0.61–1.24)
GFR, Estimated: >60 mL/min (ref >60)
Glucose, Bld: 73 mg/dL (ref 70–99)
Potassium: 3.2 mmol/L — ABNORMAL LOW (ref 3.5–5.1)
Sodium: 141 mmol/L (ref 135–145)

## 2024-01-04 LAB — RESP PANEL BY RT-PCR (RSV, FLU A&B, COVID)  RVPGX2
Influenza A by PCR: NEGATIVE
Influenza B by PCR: NEGATIVE
Resp Syncytial Virus by PCR: NEGATIVE
SARS Coronavirus 2 by RT PCR: NEGATIVE

## 2024-01-04 LAB — COMPREHENSIVE METABOLIC PANEL
ALT: 47 U/L — ABNORMAL HIGH (ref 0–44)
AST: 25 U/L (ref 15–41)
Albumin: 3 g/dL — ABNORMAL LOW (ref 3.5–5.0)
Alkaline Phosphatase: 101 U/L (ref 38–126)
Anion gap: 8 (ref 5–15)
BUN: 21 mg/dL (ref 8–23)
CO2: 23 mmol/L (ref 22–32)
Calcium: 8.6 mg/dL — ABNORMAL LOW (ref 8.9–10.3)
Chloride: 104 mmol/L (ref 98–111)
Creatinine, Ser: 1.33 mg/dL — ABNORMAL HIGH (ref 0.61–1.24)
GFR, Estimated: >60 mL/min (ref >60)
Glucose, Bld: 117 mg/dL — ABNORMAL HIGH (ref 70–99)
Potassium: 4.7 mmol/L (ref 3.5–5.1)
Sodium: 135 mmol/L (ref 135–145)
Total Bilirubin: 1.1 mg/dL (ref 0.0–1.2)
Total Protein: 6 g/dL — ABNORMAL LOW (ref 6.5–8.1)

## 2024-01-04 LAB — BRAIN NATRIURETIC PEPTIDE: B Natriuretic Peptide: 1647.7 pg/mL — ABNORMAL HIGH (ref 0.0–100.0)

## 2024-01-04 LAB — TROPONIN I (HIGH SENSITIVITY)
Troponin I (High Sensitivity): 50 ng/L — ABNORMAL HIGH (ref <18)
Troponin I (High Sensitivity): 21 ng/L — ABNORMAL HIGH (ref <18)

## 2024-01-04 LAB — MAGNESIUM: Magnesium: 1.6 mg/dL — ABNORMAL LOW (ref 1.7–2.4)

## 2024-01-04 LAB — D-DIMER, QUANTITATIVE: D-Dimer, Quant: 4.91 ug{FEU}/mL — ABNORMAL HIGH (ref 0.00–0.50)

## 2024-01-04 LAB — TSH: TSH: 1.545 u[IU]/mL (ref 0.350–4.500)

## 2024-01-04 LAB — PROCALCITONIN: Procalcitonin: <0.1 ng/mL

## 2024-01-04 MED ORDER — FUROSEMIDE 10 MG/ML IJ SOLN: 40.0000 mg | Freq: Two times a day (BID) | INTRAMUSCULAR | Status: DC

## 2024-01-04 MED ORDER — DIAZEPAM 5 MG PO TABS
5.0000 mg | ORAL_TABLET | Freq: Two times a day (BID) | ORAL | Status: DC | PRN
  Filled 2024-01-04: qty 1

## 2024-01-04 MED ORDER — POTASSIUM CHLORIDE CRYS ER 20 MEQ PO TBCR
40.0000 meq | EXTENDED_RELEASE_TABLET | Freq: Every day | ORAL | Status: DC
  Administered 2024-01-04 – 2024-01-05 (×2): 40 meq via ORAL
  Filled 2024-01-04 (×2): qty 2

## 2024-01-04 MED ORDER — CARVEDILOL 3.125 MG PO TABS
3.1250 mg | ORAL_TABLET | Freq: Two times a day (BID) | ORAL | Status: DC
  Administered 2024-01-04 – 2024-01-10 (×9): 3.125 mg via ORAL
  Filled 2024-01-04 (×12): qty 1

## 2024-01-04 MED ORDER — DAPAGLIFLOZIN PROPANEDIOL 10 MG PO TABS: 10.0000 mg | ORAL_TABLET | Freq: Every day | ORAL | Status: DC

## 2024-01-04 MED ORDER — FUROSEMIDE 10 MG/ML IJ SOLN
40.0000 mg | Freq: Once | INTRAMUSCULAR | Status: AC
  Administered 2024-01-04: 40 mg via INTRAVENOUS
  Filled 2024-01-04: qty 4

## 2024-01-04 MED ORDER — ENOXAPARIN SODIUM 40 MG/0.4ML IJ SOSY
40.0000 mg | PREFILLED_SYRINGE | INTRAMUSCULAR | Status: DC
  Administered 2024-01-04 – 2024-01-10 (×7): 40 mg via SUBCUTANEOUS
  Filled 2024-01-04 (×7): qty 0.4

## 2024-01-04 MED ORDER — PERFLUTREN LIPID MICROSPHERE
1.0000 mL | INTRAVENOUS | Status: AC | PRN
  Administered 2024-01-04: 3 mL via INTRAVENOUS

## 2024-01-04 MED ORDER — FERROUS SULFATE 325 (65 FE) MG PO TABS
325.0000 mg | ORAL_TABLET | Freq: Every day | ORAL | Status: DC
  Administered 2024-01-04 – 2024-01-07 (×4): 325 mg via ORAL
  Filled 2024-01-04 (×4): qty 1

## 2024-01-04 MED ORDER — POTASSIUM CHLORIDE CRYS ER 20 MEQ PO TBCR: 20.0000 meq | EXTENDED_RELEASE_TABLET | Freq: Every day | ORAL | Status: DC

## 2024-01-04 MED ORDER — SODIUM CHLORIDE 0.9 % IV SOLN
100.0000 mg | Freq: Two times a day (BID) | INTRAVENOUS | Status: DC
  Administered 2024-01-04 (×2): 100 mg via INTRAVENOUS
  Filled 2024-01-04 (×3): qty 100

## 2024-01-04 MED ORDER — METHADONE HCL 10 MG PO TABS
10.0000 mg | ORAL_TABLET | Freq: Three times a day (TID) | ORAL | Status: DC
  Administered 2024-01-04 – 2024-01-10 (×18): 10 mg via ORAL
  Filled 2024-01-04 (×11): qty 2
  Filled 2024-01-04: qty 1
  Filled 2024-01-04 (×5): qty 2
  Filled 2024-01-04: qty 1
  Filled 2024-01-04: qty 2

## 2024-01-04 MED ORDER — IBRUTINIB 420 MG PO TABS: 420.0000 mg | ORAL_TABLET | Freq: Every day | ORAL | Status: DC

## 2024-01-04 MED ORDER — OXYCODONE HCL 5 MG PO TABS
10.0000 mg | ORAL_TABLET | ORAL | Status: DC | PRN
  Administered 2024-01-05 – 2024-01-10 (×4): 10 mg via ORAL
  Filled 2024-01-04 (×4): qty 2

## 2024-01-04 MED ORDER — FUROSEMIDE 10 MG/ML IJ SOLN
80.0000 mg | Freq: Two times a day (BID) | INTRAMUSCULAR | Status: DC
  Administered 2024-01-04 – 2024-01-05 (×4): 80 mg via INTRAVENOUS
  Filled 2024-01-04 (×4): qty 8

## 2024-01-04 MED ORDER — SODIUM CHLORIDE 0.9 % IV SOLN
1.0000 g | INTRAVENOUS | Status: DC
  Administered 2024-01-04 – 2024-01-05 (×2): 1 g via INTRAVENOUS
  Filled 2024-01-04 (×2): qty 10

## 2024-01-04 MED ORDER — IOHEXOL 350 MG/ML SOLN
75.0000 mL | Freq: Once | INTRAVENOUS | Status: AC | PRN
  Administered 2024-01-04: 75 mL via INTRAVENOUS

## 2024-01-04 NOTE — ED Provider Notes (Signed)
 [place] EMERGENCY DEPARTMENT AT [facility] Provider Note   CSN: [id number] Arrival date & time: 01/04/24  0330     History  Chief Complaint  Patient presents with   Shortness of Breath    Connor Solomon is a 63 y.o. male.  The history is provided by the EMS personnel and the patient. The history is limited by the condition of the patient.  Shortness of Breath Severity:  Severe Onset quality:  Gradual Timing:  Constant Progression:  Worsening Chronicity:  Recurrent Context: not URI   Relieved by:  Nothing Worsened by:  Nothing Ineffective treatments:  None tried Associated symptoms: no chest pain and no fever   Associated symptoms comment:  Edema  Risk factors: no recent surgery   Patient with a h/o CHF and Waldenstrom's macroglobulinemia presents with SOB and edema.  He is currently on chemotherapy but off his lasix.      Past Medical History:  Diagnosis Date   Acute heart failure (HCC) 04/08/2021   Acute HFrEF (heart failure with reduced ejection fraction) (HCC) 04/07/2021   Arthritis    hands, elbows bilaterally   Cancer (HCC)    Full dentures    Hypertension    Pt states he doen not have HTN and has never been treated for HTN   Kidney stone on left side last stone 4-5 yrs ago   recurrent (3 episodes)   Panic disorder    pt states has resolved   Syncope    resolved- was related to panic attacks     Home Medications Prior to Admission medications   Medication Sig Start Date End Date Taking? Authorizing Provider  acetaminophen (TYLENOL) 500 MG tablet Take 500 mg by mouth every 6 (six) hours as needed. Patient not taking: Reported on 11/11/2023    [provider]  albuterol (PROAIR HFA) 108 (90 Base) MCG/ACT inhaler Inhale 2 puffs into the lungs every 6 (six) hours as needed for wheezing or shortness of breath. 05/30/23   [name], DO  azithromycin (ZITHROMAX Z-PAK) 250 MG tablet Take 2 tablets by mouth on day 1, then 1 tablet daily  on days 2 to 5 10/10/23   Jaci Standard, MD  carvedilol (COREG) 3.125 MG tablet Take 1 tablet (3.125 mg total) by mouth 2 (two) times daily. NEEDS FOLLOW UP APPOINTMENT FOR MORE REFILLS 12/09/23   Jacklynn Ganong, FNP  chlorhexidine (HIBICLENS) 4 % external liquid Apply topically daily as needed. Dilute with water and clean wound with this.  Rinse thoroughly afterwards 05/24/23   Domenick Gong, MD  clobetasol cream (TEMOVATE) 0.05 % Apply 1 Application topically 2 (two) times daily. Apply topically to the arms and hands twice daily for up to two weeks 01/05/23   Terri Piedra, DO  dapagliflozin propanediol (FARXIGA) 10 MG TABS tablet Take 1 tablet (10 mg total) by mouth daily before breakfast. NEEDS FOLLOW UP APPOINTMENT FOR ANYMORE REFILLS 08/15/23   Bensimhon, Bevelyn Buckles, MD  diazepam (VALIUM) 5 MG tablet Take 1 tablet (5 mg total) by mouth every 12 (twelve) hours as needed for anxiety. 01/04/24   Pickenpack-Cousar, [name], NP  dronabinol (MARINOL) 5 MG capsule Take 1 capsule (5 mg total) by mouth 2 (two) times daily before a meal. 12/21/23   Pickenpack-Cousar, [name], NP  DULoxetine (CYMBALTA) 30 MG capsule Take 1 capsule (30 mg total) by mouth daily. 11/11/23   Pickenpack-Cousar, [name], NP  eplerenone (INSPRA) 25 MG tablet Take 0.5 tablets (12.5 mg  total) by mouth daily. NEEDS FOLLOW UP APPOINTMENT FOR MORE REFILLS 07/12/23   Jacklynn Ganong, FNP  ferrous sulfate (FEROSUL) 325 (65 FE) MG tablet Take 1 tablet by mouth daily with breakfast. Please take with a source of Vitamin C 03/14/23   Jaci Standard, MD  ibrutinib (IMBRUVICA) 420 MG tablet Take 1 tablet (420 mg total) by mouth daily. 10/14/23   Jaci Standard, MD  methadone (DOLOPHINE) 10 MG tablet Take 1 tablet (10 mg total) by mouth every 8 (eight) hours. 12/21/23   Pickenpack-Cousar, [name], NP  naloxone [facility]) nasal spray 4 mg/0.1 mL Take for overdose 01/23/22   Ernie Avena, MD  naloxone [facility]) nasal spray 4 mg/0.1 mL  Take for overdose as directed 01/23/22   Ernie Avena, MD  ondansetron (ZOFRAN) 8 MG tablet Take 1 tablet (8 mg) by mouth every 8 hours as needed. 06/06/23   Pickenpack-Cousar, [name], NP  Oxycodone HCl 10 MG TABS Take 1 tablet (10 mg total) by mouth every 4 (four) hours as needed. 12/27/23   Pickenpack-Cousar, [name], NP  potassium chloride SA (KLOR-CON M) 20 MEQ tablet Take 1 tablet (20 mEq total) by mouth daily. 06/07/23   Jaci Standard, MD  sacubitril-valsartan (ENTRESTO) 24-26 MG Take 1 tablet by mouth 2 (two) times daily. 06/07/22   Bensimhon, Bevelyn Buckles, MD  sildenafil (VIAGRA) 50 MG tablet Take 1 tablet (50 mg total) by mouth daily as needed for erectile dysfunction. 11/04/23   Jaci Standard, MD      Allergies    Rituxan [rituximab] and Hydrocodone    Review of Systems   Review of Systems  Constitutional:  Negative for fever.  HENT:  Negative for congestion.   Respiratory:  Positive for shortness of breath. Negative for chest tightness.   Cardiovascular:  Negative for chest pain.  All other systems reviewed and are negative.   Physical Exam Updated Vital Signs BP (!) 119/96   Pulse 69   Temp (!) 96.9 F (36.1 C) (Axillary)   Resp 20   Ht 5\' 8"  (1.727 m)   Wt 60.8 kg   SpO2 100%   BMI 20.37 kg/m  Physical Exam Vitals and nursing note reviewed.  Constitutional:      General: He is not in acute distress.    Appearance: He is well-developed. He is not diaphoretic.  HENT:     Head: Normocephalic and atraumatic.     Nose: Nose normal.  Eyes:     Conjunctiva/sclera: Conjunctivae normal.     Pupils: Pupils are equal, round, and reactive to light.  Cardiovascular:     Rate and Rhythm: Normal rate and regular rhythm.     Pulses: Normal pulses.     Heart sounds: Normal heart sounds.  Pulmonary:     Effort: Pulmonary effort is normal.     Breath sounds: Rales present. No wheezing.  Abdominal:     General: Bowel sounds are normal.     Palpations: Abdomen is soft.      Tenderness: There is no abdominal tenderness. There is no guarding or rebound.  Musculoskeletal:        General: Normal range of motion.     Cervical back: Normal range of motion and neck supple.     Right lower leg: Edema present.     Left lower leg: Edema present.  Skin:    General: Skin is warm and dry.     Capillary Refill: Capillary refill takes  less than 2 seconds.  Neurological:     General: No focal deficit present.     Mental Status: He is alert and oriented to person, place, and time.     Deep Tendon Reflexes: Reflexes normal.  Psychiatric:        Mood and Affect: Mood normal.     ED Results / Procedures / Treatments   Labs (all labs ordered are listed, but only abnormal results are displayed) Results for orders placed or performed during the hospital encounter of 01/04/24  Resp panel by RT-PCR (RSV, Flu A&B, Covid) Anterior Nasal Swab   Collection Time: 01/04/24  4:25 AM   Specimen: Anterior Nasal Swab  Result Value Ref Range   SARS Coronavirus 2 by RT PCR NEGATIVE NEGATIVE   Influenza A by PCR NEGATIVE NEGATIVE   Influenza B by PCR NEGATIVE NEGATIVE   Resp Syncytial Virus by PCR NEGATIVE NEGATIVE  CBC with Differential   Collection Time: 01/04/24  4:25 AM  Result Value Ref Range   WBC 10.7 (H) 4.0 - 10.5 K/uL   RBC 5.20 4.22 - 5.81 MIL/uL   Hemoglobin 13.5 13.0 - 17.0 g/dL   HCT 16.1 09.6 - 04.5 %   MCV 85.2 80.0 - 100.0 fL   MCH 26.0 26.0 - 34.0 pg   MCHC 30.5 30.0 - 36.0 g/dL   RDW 40.9 (H) 81.1 - 91.4 %   Platelets 401 (H) 150 - 400 K/uL   nRBC 0.0 0.0 - 0.2 %   Neutrophils Relative % 83 %   Neutro Abs 8.9 (H) 1.7 - 7.7 K/uL   Lymphocytes Relative 10 %   Lymphs Abs 1.0 0.7 - 4.0 K/uL   Monocytes Relative 5 %   Monocytes Absolute 0.6 0.1 - 1.0 K/uL   Eosinophils Relative 1 %   Eosinophils Absolute 0.1 0.0 - 0.5 K/uL   Basophils Relative 0 %   Basophils Absolute 0.0 0.0 - 0.1 K/uL   Immature Granulocytes 1 %   Abs Immature Granulocytes 0.05 0.00  - 0.07 K/uL  Comprehensive metabolic panel   Collection Time: 01/04/24  4:25 AM  Result Value Ref Range   Sodium 135 135 - 145 mmol/L   Potassium 4.7 3.5 - 5.1 mmol/L   Chloride 104 98 - 111 mmol/L   CO2 23 22 - 32 mmol/L   Glucose, Bld 117 (H) 70 - 99 mg/dL   BUN 21 8 - 23 mg/dL   Creatinine, Ser 7.82 (H) 0.61 - 1.24 mg/dL   Calcium 8.6 (L) 8.9 - 10.3 mg/dL   Total Protein 6.0 (L) 6.5 - 8.1 g/dL   Albumin 3.0 (L) 3.5 - 5.0 g/dL   AST 25 15 - 41 U/L   ALT 47 (H) 0 - 44 U/L   Alkaline Phosphatase 101 38 - 126 U/L   Total Bilirubin 1.1 0.0 - 1.2 mg/dL   GFR, Estimated >95 >62 mL/min   Anion gap 8 5 - 15  Brain natriuretic peptide   Collection Time: 01/04/24  4:25 AM  Result Value Ref Range   B Natriuretic Peptide 1,647.7 (H) 0.0 - 100.0 pg/mL  Troponin I (High Sensitivity)   Collection Time: 01/04/24  4:25 AM  Result Value Ref Range   Troponin I (High Sensitivity) 50 (H) <18 ng/L   *Note: Due to a large number of results and/or encounters for the requested time period, some results have not been displayed. A complete set of results can be found in Results Review.   DG Chest Portable  1 View Result Date: 01/04/2024 CLINICAL DATA:  Shortness of breath. EXAM: PORTABLE CHEST 1 VIEW COMPARISON:  07/23/2022 FINDINGS: The heart size appears enlarged which may be exacerbated by portable technique. There are small bilateral pleural effusions left greater than right. Increase interstitial markings are noted bilaterally. Right upper and bilateral lower lobe airspace opacities noted. Visualized osseous structures appear grossly intact. IMPRESSION: 1. Cardiomegaly, small bilateral pleural effusions and increased interstitial markings compatible with congestive heart failure. 2. Right upper and bilateral lower lobe airspace opacities may reflect edema or pneumonia. Electronically Signed   By: Signa Kell M.D.   On: 01/04/2024 05:44    EKG  EKG Interpretation Date/Time:  Wednesday January 04 2024  05:36:55 EDT Ventricular Rate:  86 PR Interval:  150 QRS Duration:  96 QT Interval:  417 QTC Calculation: 499 R Axis:   60  Text Interpretation: Sinus arrhythmia Left ventricular hypertrophy Borderline T abnormalities, lateral leads Confirmed by Nicanor Alcon, Jaquitta Dupriest ([id number]) on 01/04/2024 5:47:12 AM         Radiology No results found.  Procedures Procedures    Medications Ordered in ED Medications  furosemide (LASIX) injection 40 mg (has no administration in time range)    ED Course/ Medical Decision Making/ A&P                                 Medical Decision Making Patient with a h/o CHF called ems for SOB and edema   Amount and/or Complexity of Data Reviewed Independent Historian: EMS    Details: See above  External Data Reviewed: notes.    Details: Previous notes reviewed  Labs: ordered.    Details: Elevated troponin 50, negative covid and flu. Slight elevation of white count 10.7, normal hemoglobin 13.5, normal platelets.  Normal sodium 135, normal potassium 3.7, normal creatinine  Radiology: ordered and independent interpretation performed.    Details: Edema by me on CXR ECG/medicine tests: ordered and independent interpretation performed.  Risk Prescription drug management. Decision regarding hospitalization.    Final Clinical Impression(s) / ED Diagnoses Final diagnoses:  Acute pulmonary edema (HCC)  Anasarca   The patient appears reasonably stabilized for admission considering the current resources, flow, and capabilities available in the ED at this time, and I doubt any other [facility] requiring further screening and/or treatment in the ED prior to admission.  Rx / DC Orders ED Discharge Orders     None         Braelynn Lupton, MD 01/04/24 [phone]

## 2024-01-04 NOTE — Progress Notes (Signed)
 Heart Failure Navigator Progress Note  Assessed for Heart & Vascular TOC clinic readiness.  Patient does not meet criteria due to Advanced Heart Failure Team patient of Dr. Gala Romney. .   Navigator will sign off at this time.   Rhae Hammock, BSN, Scientist, clinical (histocompatibility and immunogenetics) Only

## 2024-01-04 NOTE — Consult Note (Addendum)
 Cardiology Consultation   Patient ID: Connor Solomon MRN: [id number]; DOB: 1961/05/11  Admit date: 01/04/2024 Date of Consult: 01/04/2024  PCP:  [name], DO   [facility] Providers Cardiologist:  Arvilla Meres, MD   {  Patient Profile:   Connor Solomon is a 63 y.o. male with a hx of nonischemic cardiomyopathy with reduced EF, Waldenstrm's macroglobulinemia, IgM monoclonal gammopathy, HTN who is being seen 01/04/2024 for the evaluation of CHF exacerbation at the request of Dr. Gwendolyn Grant.  History of Present Illness:   Mr. Couey has documented history of nonischemic cardiomyopathy since June 2022.  EF has been around 25%. Underwent right left heart catheterization demonstrating normal coronary anatomy, low filling pressures and preserved cardiac output.  Cardiac MRI EF 25% with basal septum minimal LGE in a nonischemic pattern.  He was last seen outpatient on March 2023 overall had been doing okay with some side effects from his chemotherapy.  He was on as needed Lasix at that time.  Tolerating GDMT well, but still struggle with some soft pressures.  He called our outpatient service line earlier in March complaining of CHF exacerbation heart rates in the 150s and some chest tightness and was encouraged to go to the emergency room however patient refused. He also is followed by oncology for his Wahlstrom's macroglobulinemia and getting Ibrutinib + Rituxima.    Currently patient being evaluated for CHF exacerbation.  He says his CHF has been stable for several months now however within the past 8 days he started noticing orthopnea, shortness of breath with activity, peripheral edema, some chest pain worse when lying flat.  Compliant with all of his medications.  Reports that he has not taken Lasix for over 8 months now, reported some provider to stop it.  He was started on IV Lasix 40 mg and reports mild improvement in symptoms however has not had much urinary output and I's and O's  have not been accurately documented.  He also thinks that he may have pneumonia, denies any significant cough or fevers.  Started on antibiotics prophylactically.  Reports he smoking over 1 ppd   No drugs or alcohol.  Chest x-ray with cardiomegaly and small bilateral pleural effusions.  Has right upper and bilateral lower lobe airspace opacities concerning for edema or pneumonia.  BNP 1600+ negative respiratory panel.  Troponins 50-21.  Potassium 4.7.  Creatinine 1.33.  Albumin 3.0.  Total protein 6.0.  WBC 10.7.  Hemoglobin 13.5.   Past Medical History:  Diagnosis Date   Acute heart failure (HCC) 04/08/2021   Acute HFrEF (heart failure with reduced ejection fraction) (HCC) 04/07/2021   Arthritis    hands, elbows bilaterally   Cancer (HCC)    Full dentures    Hypertension    Pt states he doen not have HTN and has never been treated for HTN   Kidney stone on left side last stone 4-5 yrs ago   recurrent (3 episodes)   Panic disorder    pt states has resolved   Syncope    resolved- was related to panic attacks    Past Surgical History:  Procedure Laterality Date   CARPAL METACARPAL FUSION WITH DISTAL RADIAL BONE GRAFT Left 05/30/2013   Procedure: LEFT THUMB METACARPAL JOINT FUSION;  Surgeon: [name], MD;  Location: [facility];  Service: Orthopedics;  Laterality: Left;   ESOPHAGOGASTRODUODENOSCOPY N/A 02/12/2014   Procedure: ESOPHAGOGASTRODUODENOSCOPY (EGD);  Surgeon: [name], MD;  Location: [facility] ENDOSCOPY;  Service: Endoscopy;  Laterality: N/A;   FOREIGN BODY REMOVAL N/A 02/12/2014   Procedure: FOREIGN BODY REMOVAL;  Surgeon: [name], MD;  Location: [facility] ENDOSCOPY;  Service: Endoscopy;  Laterality: N/A;   OPEN REDUCTION INTERNAL FIXATION (ORIF) DISTAL RADIAL FRACTURE Left 11/29/2012   Procedure: OPEN REDUCTION INTERNAL FIXATION (ORIF) DISTAL RADIAL FRACTURE;  Surgeon: [name], MD;  Location: [facility];  Service: Orthopedics;   Laterality: Left;  LEFT DISTAL RADIUS OSTEOTOMY WITH BONE GRAFT   RIGHT/LEFT HEART CATH AND CORONARY ANGIOGRAPHY N/A 04/09/2021   Procedure: RIGHT/LEFT HEART CATH AND CORONARY ANGIOGRAPHY;  Surgeon: [name], MD;  Location: [facility];  Service: Cardiovascular;  Laterality: N/A;   TENDON REPAIR Left 02/07/2013   Procedure: LEFT EXTENSOR INDICIS PROPRIUS  TO EXTENSOR POLLICIS LONGUS TENDON TRANSFER;  Surgeon: [name], MD;  Location: [facility];  Service: Orthopedics;  Laterality: Left;   WISDOM TOOTH EXTRACTION     WRIST SURGERY     fx only      Inpatient Medications: Scheduled Meds:  carvedilol  3.125 mg Oral BID   enoxaparin (LOVENOX) injection  40 mg Subcutaneous Q24H   ferrous sulfate  325 mg Oral Q breakfast   furosemide  40 mg Intravenous Q12H   ibrutinib  420 mg Oral Daily   methadone  10 mg Oral Q8H   potassium chloride SA  20 mEq Oral Daily   Continuous Infusions:  cefTRIAXone (ROCEPHIN)  IV     doxycycline (VIBRAMYCIN) IV     PRN Meds: diazepam, oxyCODONE  Allergies:    Allergies  Allergen Reactions   Rituxan [Rituximab] Other (See Comments) and Cough    Mild chest discomfort & rigors   Hydrocodone Itching and Nausea And Vomiting    Social History:   Social History   Socioeconomic History   Marital status: Single    Spouse name: Not on file   Number of children: Not on file   Years of education: Not on file   Highest education level: Not on file  Occupational History   Not on file  Tobacco Use   Smoking status: Former    Types: Cigars    Quit date: 10/11/2020    Years since quitting: 3.2   Smokeless tobacco: Never   Tobacco comments:    smokes 1 cigar a week  Vaping Use   Vaping status: Never Used  Substance and Sexual Activity   Alcohol use: No   Drug use: No   Sexual activity: Not on file  Other Topics Concern   Not on file  Social History Narrative   Not on file   Social Drivers of Health    Financial Resource Strain: High Risk (07/17/2021)   Overall Financial Resource Strain (CARDIA)    Difficulty of Paying Living Expenses: Very hard  Food Insecurity: Food Insecurity Present (04/10/2021)   Hunger Vital Sign    Worried About Running Out of Food in the Last Year: Sometimes true    Ran Out of Food in the Last Year: Sometimes true  Transportation Needs: No Transportation Needs (04/10/2021)   PRAPARE - Administrator, Civil Service (Medical): No    Lack of Transportation (Non-Medical): No  Physical Activity: Not on file  Stress: Stress Concern Present (09/01/2021)   [employer] of Occupational Health - Occupational Stress Questionnaire    Feeling of Stress : Very much  Social Connections: Not on file  Intimate Partner Violence: Not on file    Family  History:   Family History  Problem Relation Age of Onset   Heart attack Father    Sudden Cardiac Death Father 26   Diabetes Neg Hx    Hyperlipidemia Neg Hx    Hypertension Neg Hx      ROS:  Please see the history of present illness. All other ROS reviewed and negative.     Physical Exam/Data:   Vitals:   01/04/24 0400 01/04/24 0408 01/04/24 0700 01/04/24 0730  BP: (!) 119/96   122/88  Pulse: 69   80  Resp: 20   (!) 22  Temp: (!) 96.9 F (36.1 C)  (!) 97.2 F (36.2 C)   TempSrc: Axillary  Axillary   SpO2: 100%   100%  Weight:  60.8 kg    Height:  5\' 8"  (1.727 m)     No intake or output data in the 24 hours ending 01/04/24 0803    01/04/2024    4:08 AM 12/09/2023   11:44 AM 11/11/2023    8:40 AM  Last 3 Weights  Weight (lbs) 134 lb 130 lb 6.4 oz 130 lb 4.8 oz  Weight (kg) 60.782 kg 59.149 kg 59.104 kg     Body mass index is 20.37 kg/m.  General:  Well nourished, well developed, in no acute distress HEENT: normal Neck: mild JVD Vascular: No carotid bruits; Distal pulses 2+ bilaterally Cardiac:  normal S1, S2; RRR; no murmur  Lungs: Diminished breath sounds on the L side  Abd: soft,  nontender, no hepatomegaly  Ext: 1-2+ edema Musculoskeletal:  No deformities, BUE and BLE strength normal and equal Skin: warm and dry  Neuro:  CNs 2-12 intact, no focal abnormalities noted Psych:  Normal affect   EKG:  The EKG was personally reviewed and demonstrates: Sinus arrhythmia heart rate 86.  No acute ST-T wave changes.  Probable LVH. Telemetry:  Telemetry was personally reviewed and demonstrates: Sinus rhythm heart rates in the 80s  Relevant CV Studies: Echocardiogram 09/14/2021 1. Left ventricular ejection fraction, by estimation, is 25%. The left  ventricle has normal function. The left ventricle demonstrates global  hypokinesis. The left ventricular internal cavity size was moderately  dilated. Left ventricular diastolic  parameters are consistent with Grade I diastolic dysfunction (impaired  relaxation).   2. Right ventricular systolic function is normal. The right ventricular  size is normal.   3. Left atrial size was moderately dilated.   4. Right atrial size was mildly dilated.   5. The mitral valve is normal in structure. Mild mitral valve  regurgitation. No evidence of mitral stenosis.   6. The aortic valve is tricuspid. Aortic valve regurgitation is mild. No  aortic stenosis is present.   7. Aortic dilatation noted. There is borderline dilatation of the  ascending aorta, measuring 38 mm.   8. The inferior vena cava is normal in size with greater than 50%  respiratory variability, suggesting right atrial pressure of 3 mmHg.   Cardiac MRI 04/10/2021 1.  Mild LV dilatation with severe systolic dysfunction (EF 25%)   2.  Normal RV size and systolic function (EF 49%)   3. Basal septal midwall late gadolinium enhancement, which is a nonspecific scar pattern seen in nonischemic cardiomyopathies and associated with a worse prognosis   4.  Mild mitral regurgitation (regurgitant fraction 18%)  Right left heart catheterization 04/09/2021 Ao =98/68 (82) LV = 81/3 RA =  1 RV = 27/2 PA = 25/8 (16) PCW = 10 Fick cardiac output/index = 5.8/3.2 PVR = 1.0  WU Ao sat = 100% PA sat = 66%, 70%   Assessment: 1. Normal coronary arteries 2. 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 Low filling pressures with normal cardiac output   Plan/Discussion:   Volume status low. Will give back some fluid. Stop lasix. Plan cMRI to further evaluate cardiomyopathy. Titrate GDMT as tolerated.       Laboratory Data:  High Sensitivity Troponin:   Recent Labs  Lab 01/04/24 0425 01/04/24 0628  TROPONINIHS 50* 21*     Chemistry Recent Labs  Lab 01/04/24 0425  NA 135  K 4.7  CL 104  CO2 23  GLUCOSE 117*  BUN 21  CREATININE 1.33*  CALCIUM 8.6*  GFRNONAA >60  ANIONGAP 8    Recent Labs  Lab 01/04/24 0425  PROT 6.0*  ALBUMIN 3.0*  AST 25  ALT 47*  ALKPHOS 101  BILITOT 1.1   Lipids No results for input(s): "CHOL", "TRIG", "HDL", "LABVLDL", "LDLCALC", "CHOLHDL" in the last 168 hours.  Hematology Recent Labs  Lab 01/04/24 0425  WBC 10.7*  RBC 5.20  HGB 13.5  HCT 44.3  MCV 85.2  MCH 26.0  MCHC 30.5  RDW 16.8*  PLT 401*   Thyroid No results for input(s): "TSH", "FREET4" in the last 168 hours.  BNP Recent Labs  Lab 01/04/24 0425  BNP 1,647.7*    DDimer No results for input(s): "DDIMER" in the last 168 hours.   Radiology/Studies:  US RENAL Result Date: 01/04/2024 CLINICAL DATA:  Acute renal failure EXAM: RENAL / URINARY TRACT ULTRASOUND COMPLETE COMPARISON:  Abdominal CT 04/07/2021 FINDINGS: Right Kidney: Renal measurements: 10 x 5.4 x 5.8 cm = volume: 160 mL. Mildly echogenic cortex with corticomedullary prominence. No mass or hydronephrosis visualized. Left Kidney: Renal measurements: 9 x 5 x 5 cm = volume: 120 mL. Mildly echogenic cortex with corticomedullary prominence. 5 mm stone with twinkle artifact, nonobstructive. Mild hydronephrosis. Bladder: Full urinary bladder with some internal echoes attributed to debris. Jets were not seen on either side. Other:  Bilateral pleural effusion which is simple where covered. IMPRESSION: 1. Mild left hydronephrosis.Left nephrolithiasis. 2. Medical renal disease with mild atrophy on the left. 3. Moderate distension of the urinary bladder which contains some debris, correlate with urinalysis. Electronically Signed   By: Tiburcio Pea M.D.   On: 01/04/2024 07:59   DG Chest Portable 1 View Result Date: 01/04/2024 CLINICAL DATA:  Shortness of breath. EXAM: PORTABLE CHEST 1 VIEW COMPARISON:  07/23/2022 FINDINGS: The heart size appears enlarged which may be exacerbated by portable technique. There are small bilateral pleural effusions left greater than right. Increase interstitial markings are noted bilaterally. Right upper and bilateral lower lobe airspace opacities noted. Visualized osseous structures appear grossly intact. IMPRESSION: 1. Cardiomegaly, small bilateral pleural effusions and increased interstitial markings compatible with congestive heart failure. 2. Right upper and bilateral lower lobe airspace opacities may reflect edema or pneumonia. Electronically Signed   By: Signa Kell M.D.   On: 01/04/2024 05:44     Assessment and Plan:   Nonischemic cardiomyopathy with reduced EF 25% -03/2021 R/LHC no CAD, low filling pressure/preserved cardiac output -04/2021 cardiac MRI EF 25%, normal RV, LGE with nonischemic scar pattern -09/2021 echo 25%, normal RV.  Mild MR  Generally does not have diuretic need, 8 days has had worsening DOE, peripheral edema.  Question of compliancy with medications.  Started on IV Lasix 40 mg with poor urinary output, still volume up. Increase from IV Lasix 40 mg to 80 mg twice daily PTA GDMT: Carvedilol  3.125 mg twice daily, Farxiga 10 mg, eplerenone 12.5 mg (had gynecomastia with spiro), Entresto 24-26 mg. Inpatient GDMT: Continue with carvedilol 3.125 mg twice daily (okay for now) Echo pending At DC needs to follow-up with advanced heart failure  Chest pain EKG with no acute  ST-T wave changes.  Troponins not indicative of ACS chronically elevated 50-21.  Likely related to congestion.  Waldenstrm's macroglobulinemia Chronic pain Followed by oncology. On Ibrutinib.   Noncompliance? He had reported to me compliancy with all of his medications but med reconciliation reports he was not taking numerous medications.    Risk Assessment/Risk Scores:     New York Heart Association (NYHA) Functional Class NYHA Class III        For questions or updates, please contact [facility] Please consult [email or web address] for contact info under    Signed, Abagail Kitchens, PA-C  01/04/2024 8:03 AM   Pt seen and examined   I agree with findings as noted above by Thamas Jaegers Pt is a 63 yo with NICM    Over past week hs had increased dyspnea, increased LE edema   Notes no change in diet   Taking meds Currently breathing a little better  On exam JVP increased Lungs  Mild rhonchi Cardiac Exam:   RRR  No S3 Abd   Supple Ext with 1+ edema    As noted above would continue diuresis   Increase to 80 mg bid     On carvedilol now   FOllow   Add back mes as BP tolerates  Will continue to follow  Dietrich Pates MD

## 2024-01-04 NOTE — H&P (Addendum)
 History and Physical    Connor Solomon DGL:[id number] DOB: Aug 24, 1961 DOA: 01/04/2024  Patient coming from: Home.  Chief Complaint: Shortness of breath.  HPI: Connor Solomon is a 63 y.o. male with history of Waldenstrm's macroglobulinemia, nonischemic cardiomyopathy last EF measured was 25% with grade 1 diastolic dysfunction on 12/22, hypertension, anxiety, chronic pain presents to the ER because of worsening shortness of breath.  Patient is being treated for Waldenstrm's macroglobulinemia by Dr. Leonides Schanz oncologist and is on ibrutinib/rituximab.  Last 4 to 5 days patient states he has been getting exertionally short of breath that he is not able to walk back from the kitchen because of the shortness of breath and also has been having some orthopnea and increasing lower extremity edema.  Patient states he used to be on Lasix which was discontinued few months ago.  Has some chest tightness and also had recent upper respiratory infection and a month ago also had some palpitations.  ED Course: In the ER patient's labs show BNP of 1600 and troponin of 50.  EKG shows sinus arrhythmia.  Creatinine 1.3 WBC 10.7 chest x-ray shows small bilateral pleural effusion and signs of CHF.  There is also bilateral airspace opacities concerning for pneumonia versus CHF.  Patient was given Lasix 40 mg IV and admitted for further management of CHF.  Review of Systems: As per HPI, rest all negative.   Past Medical History:  Diagnosis Date   Acute heart failure (HCC) 04/08/2021   Acute HFrEF (heart failure with reduced ejection fraction) (HCC) 04/07/2021   Arthritis    hands, elbows bilaterally   Cancer (HCC)    Full dentures    Hypertension    Pt states he doen not have HTN and has never been treated for HTN   Kidney stone on left side last stone 4-5 yrs ago   recurrent (3 episodes)   Panic disorder    pt states has resolved   Syncope    resolved- was related to panic attacks    Past Surgical History:   Procedure Laterality Date   CARPAL METACARPAL FUSION WITH DISTAL RADIAL BONE GRAFT Left 05/30/2013   Procedure: LEFT THUMB METACARPAL JOINT FUSION;  Surgeon: [name], MD;  Location: [facility];  Service: Orthopedics;  Laterality: Left;   ESOPHAGOGASTRODUODENOSCOPY N/A 02/12/2014   Procedure: ESOPHAGOGASTRODUODENOSCOPY (EGD);  Surgeon: [name], MD;  Location: [facility] END[facility]COPY;  Service: Endoscopy;  Laterality: N/A;   FOREIGN BODY REMOVAL N/A 02/12/2014   Procedure: FOREIGN BODY REMOVAL;  Surgeon: [name], MD;  Location: [facility] ENDOSCOPY;  Service: Endoscopy;  Laterality: N/A;   OPEN REDUCTION INTERNAL FIXATION (ORIF) DISTAL RADIAL FRACTURE Left 11/29/2012   Procedure: OPEN REDUCTION INTERNAL FIXATION (ORIF) DISTAL RADIAL FRACTURE;  Surgeon: [name], MD;  Location: [facility];  Service: Orthopedics;  Laterality: Left;  LEFT DISTAL RADIUS OSTEOTOMY WITH BONE GRAFT   RIGHT/LEFT HEART CATH AND CORONARY ANGIOGRAPHY N/A 04/09/2021   Procedure: RIGHT/LEFT HEART CATH AND CORONARY ANGIOGRAPHY;  Surgeon: [name], MD;  Location: [facility];  Service: Cardiovascular;  Laterality: N/A;   TENDON REPAIR Left 02/07/2013   Procedure: LEFT EXTENSOR INDICIS PROPRIUS  TO EXTENSOR POLLICIS LONGUS TENDON TRANSFER;  Surgeon: [name], MD;  Location: [facility];  Service: Orthopedics;  Laterality: Left;   WISDOM TOOTH EXTRACTION     WRIST SURGERY     fx only     reports that he quit smoking about 3  years ago. His smoking use included cigars. He has never used smokeless tobacco. He reports that he does not drink alcohol and does not use drugs.  Allergies  Allergen Reactions   Rituxan [Rituximab] Other (See Comments) and Cough    Mild chest discomfort & rigors   Hydrocodone Itching and Nausea And Vomiting    Family History  Problem Relation Age of Onset   Heart attack Father    Sudden Cardiac Death Father 53   Diabetes  Neg Hx    Hyperlipidemia Neg Hx    Hypertension Neg Hx     Prior to Admission medications   Medication Sig Start Date End Date Taking? Authorizing Provider  acetaminophen (TYLENOL) 500 MG tablet Take 500 mg by mouth every 6 (six) hours as needed.   Yes [provider]  albuterol (PROAIR HFA) 108 (90 Base) MCG/ACT inhaler Inhale 2 puffs into the lungs every 6 (six) hours as needed for wheezing or shortness of breath. 05/30/23  Yes Bender, Irving Burton, DO  carvedilol (COREG) 3.125 MG tablet Take 1 tablet (3.125 mg total) by mouth 2 (two) times daily. NEEDS FOLLOW UP APPOINTMENT FOR MORE REFILLS 12/09/23  Yes Minor Hill, Anderson Malta, FNP  dapagliflozin propanediol (FARXIGA) 10 MG TABS tablet Take 1 tablet (10 mg total) by mouth daily before breakfast. NEEDS FOLLOW UP APPOINTMENT FOR ANYMORE REFILLS 08/15/23  Yes Bensimhon, Bevelyn Buckles, MD  diazepam (VALIUM) 5 MG tablet Take 1 tablet (5 mg total) by mouth every 12 (twelve) hours as needed for anxiety. 01/04/24  Yes Pickenpack-Cousar, [name], NP  ferrous sulfate (FEROSUL) 325 (65 FE) MG tablet Take 1 tablet by mouth daily with breakfast. Please take with a source of Vitamin C 03/14/23  Yes Jaci Standard, MD  ibrutinib (IMBRUVICA) 420 MG tablet Take 1 tablet (420 mg total) by mouth daily. 10/14/23  Yes Jaci Standard, MD  methadone (DOLOPHINE) 10 MG tablet Take 1 tablet (10 mg total) by mouth every 8 (eight) hours. 12/21/23  Yes Pickenpack-Cousar, [name], NP  naloxone [facility]) nasal spray 4 mg/0.1 mL Take for overdose 01/23/22  Yes Ernie Avena, MD  naloxone [facility]) nasal spray 4 mg/0.1 mL Take for overdose as directed 01/23/22  Yes Ernie Avena, MD  ondansetron (ZOFRAN) 8 MG tablet Take 1 tablet (8 mg) by mouth every 8 hours as needed. 06/06/23  Yes Pickenpack-Cousar, [name], NP  Oxycodone HCl 10 MG TABS Take 1 tablet (10 mg total) by mouth every 4 (four) hours as needed. 12/27/23  Yes Pickenpack-Cousar, [name], NP  potassium chloride SA (KLOR-CON M)  20 MEQ tablet Take 1 tablet (20 mEq total) by mouth daily. 06/07/23  Yes Jaci Standard, MD  sildenafil (VIAGRA) 50 MG tablet Take 1 tablet (50 mg total) by mouth daily as needed for erectile dysfunction. 11/04/23  Yes Jaci Standard, MD  azithromycin (ZITHROMAX Z-PAK) 250 MG tablet Take 2 tablets by mouth on day 1, then 1 tablet daily on days 2 to 5 Patient not taking: Reported on 01/04/2024 10/10/23   Jaci Standard, MD  chlorhexidine (HIBICLENS) 4 % external liquid Apply topically daily as needed. Dilute with water and clean wound with this.  Rinse thoroughly afterwards Patient not taking: Reported on 01/04/2024 05/24/23   Domenick Gong, MD  clobetasol cream (TEMOVATE) 0.05 % Apply 1 Application topically 2 (two) times daily. Apply topically to the arms and hands twice daily for up to two weeks 01/05/23   Terri Piedra, DO  dronabinol (MARINOL) 5 MG capsule Take 1 capsule (5 mg total) by mouth 2 (two) times daily before a meal. Patient not taking: Reported on 01/04/2024 12/21/23   Pickenpack-Cousar, [name], NP  DULoxetine (CYMBALTA) 30 MG capsule Take 1 capsule (30 mg total) by mouth daily. Patient not taking: Reported on 01/04/2024 11/11/23   Pickenpack-Cousar, [name], NP  eplerenone (INSPRA) 25 MG tablet Take 0.5 tablets (12.5 mg total) by mouth daily. NEEDS FOLLOW UP APPOINTMENT FOR MORE REFILLS Patient not taking: Reported on 01/04/2024 07/12/23   Jacklynn Ganong, FNP  sacubitril-valsartan (ENTRESTO) 24-26 MG Take 1 tablet by mouth 2 (two) times daily. Patient not taking: Reported on 01/04/2024 06/07/22   Bensimhon, Bevelyn Buckles, MD    Physical Exam: Constitutional: Moderately built and nourished. Vitals:   01/04/24 0400 01/04/24 0408  BP: (!) 119/96   Pulse: 69   Resp: 20   Temp: (!) 96.9 F (36.1 C)   TempSrc: Axillary   SpO2: 100%   Weight:  60.8 kg  Height:  5\' 8"  (1.727 m)   Eyes: Anicteric no pallor. ENMT: No discharge from the ears eyes nose or mouth. Neck: JVD  elevated no mass felt. Respiratory: No rhonchi or crepitations. Cardiovascular: S1-S2 heard. Abdomen: Soft nontender bowel sound present. Musculoskeletal: Bilateral lower extremity edema present. Skin: No rash. Neurologic: Alert awake oriented to time place and person.  Moves all extremities. Psychiatric: Appears normal.  Normal affect.   Labs on Admission: I have personally reviewed following labs and imaging studies  CBC: Recent Labs  Lab 01/04/24 0425  WBC 10.7*  NEUTROABS 8.9*  HGB 13.5  HCT 44.3  MCV 85.2  PLT 401*   Basic Metabolic Panel: Recent Labs  Lab 01/04/24 0425  NA 135  K 4.7  CL 104  CO2 23  GLUCOSE 117*  BUN 21  CREATININE 1.33*  CALCIUM 8.6*   GFR: Estimated Creatinine Clearance: 49.5 mL/min (A) (by C-G formula based on SCr of 1.33 mg/dL (H)). Liver Function Tests: Recent Labs  Lab 01/04/24 0425  AST 25  ALT 47*  ALKPHOS 101  BILITOT 1.1  PROT 6.0*  ALBUMIN 3.0*   No results for input(s): "LIPASE", "AMYLASE" in the last 168 hours. No results for input(s): "AMMONIA" in the last 168 hours. Coagulation Profile: No results for input(s): "INR", "PROTIME" in the last 168 hours. Cardiac Enzymes: No results for input(s): "CKTOTAL", "CKMB", "CKMBINDEX", "TROPONINI" in the last 168 hours. BNP (last 3 results) No results for input(s): "PROBNP" in the last 8760 hours. HbA1C: No results for input(s): "HGBA1C" in the last 72 hours. CBG: No results for input(s): "GLUCAP" in the last 168 hours. Lipid Profile: No results for input(s): "CHOL", "HDL", "LDLCALC", "TRIG", "CHOLHDL", "LDLDIRECT" in the last 72 hours. Thyroid Function Tests: No results for input(s): "TSH", "T4TOTAL", "FREET4", "T3FREE", "THYROIDAB" in the last 72 hours. Anemia Panel: No results for input(s): "VITAMINB12", "FOLATE", "FERRITIN", "TIBC", "IRON", "RETICCTPCT" in the last 72 hours. Urine analysis:    Component Value Date/Time   COLORURINE YELLOW 07/23/2022 1127    APPEARANCEUR CLEAR 07/23/2022 1127   LABSPEC 1.020 07/23/2022 1127   PHURINE 5.0 07/23/2022 1127   GLUCOSEU >=500 (A) 07/23/2022 1127   HGBUR NEGATIVE 07/23/2022 1127   HGBUR negative 01/05/2010 1319   BILIRUBINUR NEGATIVE 07/23/2022 1127   KETONESUR NEGATIVE 07/23/2022 1127   PROTEINUR NEGATIVE 07/23/2022 1127   UROBILINOGEN 0.2 01/05/2010 1319   NITRITE NEGATIVE 07/23/2022 1127   LEUKOCYTESUR NEGATIVE 07/23/2022 1127   Sepsis Labs: @LABRCNTIP (procalcitonin:4,lacticidven:4) ) Recent  Results (from the past 240 hours)  Resp panel by RT-PCR (RSV, Flu A&B, Covid) Anterior Nasal Swab     Status: None   Collection Time: 01/04/24  4:25 AM   Specimen: Anterior Nasal Swab  Result Value Ref Range Status   SARS Coronavirus 2 by RT PCR NEGATIVE NEGATIVE Final    Comment: (NOTE) SARS-CoV-2 target nucleic acids are NOT DETECTED.  The SARS-CoV-2 RNA is generally detectable in upper respiratory specimens during the acute phase of infection. The lowest concentration of SARS-CoV-2 viral copies this assay can detect is 138 copies/mL. A negative result does not preclude SARS-Cov-2 infection and should not be used as the sole basis for treatment or other patient management decisions. A negative result may occur with  improper specimen collection/handling, submission of specimen other than nasopharyngeal swab, presence of viral mutation(s) within the areas targeted by this assay, and inadequate number of viral copies(<138 copies/mL). A negative result must be combined with clinical observations, patient history, and epidemiological information. The expected result is Negative.  Fact Sheet for Patients:  BloggerCourse.com  Fact Sheet for Healthcare Providers:  SeriousBroker.it  This test is no t yet approved or cleared by the [place] FDA and  has been authorized for detection and/or diagnosis of SARS-CoV-2 by FDA under an Emergency Use  Authorization (EUA). This EUA will remain  in effect (meaning this test can be used) for the duration of the COVID-19 declaration under Section 564(b)(1) of the Act, 21 U.S.C.section 360bbb-3(b)(1), unless the authorization is terminated  or revoked sooner.       Influenza A by PCR NEGATIVE NEGATIVE Final   Influenza B by PCR NEGATIVE NEGATIVE Final    Comment: (NOTE) The Xpert Xpress SARS-CoV-2/FLU/RSV plus assay is intended as an aid in the diagnosis of influenza from Nasopharyngeal swab specimens and should not be used as a sole basis for treatment. Nasal washings and aspirates are unacceptable for Xpert Xpress SARS-CoV-2/FLU/RSV testing.  Fact Sheet for Patients: BloggerCourse.com  Fact Sheet for Healthcare Providers: SeriousBroker.it  This test is not yet approved or cleared by the [place] FDA and has been authorized for detection and/or diagnosis of SARS-CoV-2 by FDA under an Emergency Use Authorization (EUA). This EUA will remain in effect (meaning this test can be used) for the duration of the COVID-19 declaration under Section 564(b)(1) of the Act, 21 U.S.C. section 360bbb-3(b)(1), unless the authorization is terminated or revoked.     Resp Syncytial Virus by PCR NEGATIVE NEGATIVE Final    Comment: (NOTE) Fact Sheet for Patients: BloggerCourse.com  Fact Sheet for Healthcare Providers: SeriousBroker.it  This test is not yet approved or cleared by the [place] FDA and has been authorized for detection and/or diagnosis of SARS-CoV-2 by FDA under an Emergency Use Authorization (EUA). This EUA will remain in effect (meaning this test can be used) for the duration of the COVID-19 declaration under Section 564(b)(1) of the Act, 21 U.S.C. section 360bbb-3(b)(1), unless the authorization is terminated or revoked.  Performed at [facility],  [address] [place], [place] [address]      Radiological Exams on Admission: DG Chest Portable 1 View Result Date: 01/04/2024 CLINICAL DATA:  Shortness of breath. EXAM: PORTABLE CHEST 1 VIEW COMPARISON:  07/23/2022 FINDINGS: The heart size appears enlarged which may be exacerbated by portable technique. There are small bilateral pleural effusions left greater than right. Increase interstitial markings are noted bilaterally. Right upper and bilateral lower lobe airspace opacities noted. Visualized osseous structures appear grossly  intact. IMPRESSION: 1. Cardiomegaly, small bilateral pleural effusions and increased interstitial markings compatible with congestive heart failure. 2. Right upper and bilateral lower lobe airspace opacities may reflect edema or pneumonia. Electronically Signed   By: Signa Kell M.D.   On: 01/04/2024 05:44    EKG: Independently reviewed.  Sinus arrhythmia.  Assessment/Plan Principal Problem:   Acute CHF (congestive heart failure) (HCC) Active Problems:   Benign essential hypertension   Waldenstrom macroglobulinemia   Acute on chronic HFrEF (heart failure with reduced ejection fraction) (HCC)   ARF (acute renal failure) (HCC)   Elevated LFTs    Acute on chronic HFrEF last EF measured was 25% with grade 1 diastolic dysfunction on 12/22.  Patient's symptoms are consistent with CHF exacerbation with elevated JVD and lower extremity edema and exertional shortness of breath with chest x-ray showing pleural effusion.  Patient did receive 40 mg IV Lasix in the ER.  I have ordered 2 more doses of 40 mg IV Lasix will need to closely monitor renal function.  Given the mildly elevated creatinine from baseline I am holding off patient's Farxiga and eplerenone for now.  Patient states he has not been taking Entresto.  Since there is also concern for airspace opacity and patient had recent upper respiratory infection I am placing patient on empiric antibiotics for now and  check procalcitonin and discontinue if procalcitonin is negative.  Continue Coreg.  Check 2D echo.  I did consult cardiology.  Will also check D-dimer and Dopplers of the lower extremity. Acute renal failure with creatinine increased from 0.9 on December 09, 2023 it is around 1.3.  Could be due to cardiorenal syndrome.  Did receive Lasix.  Checking UA and renal ultrasound.  Given the Waldenstrm's macroglobulinemia will need to closely monitor renal function. Elevated troponin likely from CHF.  He did have some chest tightness earlier.  Will trend cardiac markers check 2D echo.  Consult cardiology.  Aspirin. Waldenstrm's macroglobulinemia being followed by oncologist.  On ibrutinib and rituximab. Chronic pain on methadone and oxycodone.  Medications confirmed on PDMP website. Mildly elevated LFTs could be from CHF.  Monitor.  Since patient has acute CHF exacerbation with renal failure will need close monitoring and more than 2 midnight stay.   DVT prophylaxis: Lovenox. Code Status: Full code confirmed with patient. Family Communication: Discussed with patient. Disposition Plan: Telemetry. Consults called: Cardiology.  (Cardiology consult request sent through email). Admission status: Observation.

## 2024-01-04 NOTE — Progress Notes (Signed)
 Lower extremity venous duplex completed. Please see CV Procedures for preliminary results.  Shona Simpson, RVT 01/04/24 1:21 PM

## 2024-01-04 NOTE — Progress Notes (Signed)
 Prior-To-Admission Oral Chemotherapy for Treatment of Oncologic Disease   Order noted from Dr. Toniann Fail to continue prior-to-admission oral chemotherapy regimen of ibrutinib.  Procedure Per Pharmacy & Therapeutics Committee Policy: Orders for continuation of home oral chemotherapy for treatment of an oncologic disease will be held unless approved by an oncologist during current admission.    For patients receiving oncology care at [facility], inpatient pharmacist contacts patient's oncologist during regular office hours to review. If earlier review is medically necessary, attending physician consults [facility] on-call oncologist   For patients receiving oncology care outside of [facility], attending physician consults patient's oncologist to review. If this oncologist or their coverage cannot be reached, attending physician consults [facility] on-call oncologist   Oral chemotherapy continuation order is on hold pending oncologist review, [facility] oncologist Leonides Schanz will be notified by inpatient pharmacy during office hours    Update: Dr Leonides Schanz reviewed patient profile and stated to HOLD ibrutinib for now.   Junita Push PharmD 01/04/2024, 2:39 PM

## 2024-01-04 NOTE — Progress Notes (Signed)
 Echocardiogram 2D Echocardiogram has been performed.  Connor Solomon 01/04/2024, 1:09 PM

## 2024-01-04 NOTE — Progress Notes (Signed)
 Progress Note   Patient: Connor Solomon BJY:[id number] DOB: [birth year] DOA: 01/04/2024     0 DOS: the patient was seen and examined on 01/04/2024   Brief hospital course: Connor Solomon is a 63 y.o. male with history of Waldenstrm's macroglobulinemia, nonischemic cardiomyopathy last EF measured was 25% with grade 1 diastolic dysfunction on 12/22, hypertension, anxiety, chronic pain presents to the ER because of worsening shortness of breath.  Patient is being treated for Waldenstrm's macroglobulinemia by Dr. Leonides Schanz oncologist and is on ibrutinib/rituximab.  Last 4 to 5 days patient states he has been getting exertionally short of breath that he is not able to walk back from the kitchen because of the shortness of breath and also has been having some orthopnea and increasing lower extremity edema.  Patient states he used to be on Lasix which was discontinued few months ago.  Has some chest tightness and also had recent upper respiratory infection and a month ago also had some palpitations.  Came to ER for same symptoms and was admitted for presumed CHF exacerbation.  Assessment and Plan: Acute on chronic HFrEF  - last EF measured was 25% with grade 1 diastolic dysfunction on 12/22.   - Patient's symptoms are consistent with CHF exacerbation with elevated JVD and lower extremity edema and exertional shortness of breath with chest x-ray showing pleural effusion.  Patient did receive 40 mg IV Lasix in the ER.   -Agree with continuing to hold Farxiga and eplerenone -Cardiology has been consulted.  Appreciate input.  2D echo performed and read pending -BNP >1600  Shortness of breath: -CHF seems most likely diagnosis. -Procalcitonin is pending.  He was started on empiric antibiotics on admission. -D-dimer obtained and this is positive.  Lower extremity Dopplers were negative.  In light of patient's active cancer I think prudent course would be to check CTA to ensure no pulmonary embolism, even though we  have more likely diagnosis of CHF as cause of dyspnea. -Notes that his lungs did sound Pretty good on my exam without crackles Thus another reason to obtain CTA  Acute renal failure  - with creatinine increased from 0.9 on December 09, 2023 it is around 1.3.  Could be due to cardiorenal syndrome.  Did receive Lasix.  Checking UA and renal ultrasound.  Given the Waldenstrm's macroglobulinemia will need to closely monitor renal function.  Elevated troponin -Likely strain from CHF.  No acute ischemic changes on EKG. -Of note his troponins do appear to be chronically elevated. -2D echo pending. -CTA as above. -Appreciate cardiology input.  Waldenstrm's macroglobulinemia  - being followed by oncologist.  On ibrutinib and rituximab.  Chronic pain on methadone and oxycodone.  Medications confirmed on PDMP website.  Mildly elevated LFTs could be from CHF.  Monitor.  Hypokalemia: -With hypomagnesemia.  Replaced. -Will trend.  Metabolic acidosis: -None anion gap. -Patient actually pretty well on exam. -Will trend as well.       Subjective: Patient reports that his shortness of breath overall has improved although he did have 2 episodes of dyspnea at rest since being in the ER.  Also dyspneic if he gets up to walk around.  No nausea or vomiting.  No current chest pain.  Physical Exam: Vitals:   01/04/24 1300 01/04/24 1401 01/04/24 1430 01/04/24 1530  BP: 119/88  122/82 122/82  Pulse: 82  76 74  Resp: 14  15 14   Temp:  97.8 F (36.6 C)    TempSrc:  Oral    SpO2:  98%  99% 100%  Weight:      Height:       Eyes: Anicteric no pallor. ENMT: No discharge from the ears eyes nose or mouth. Neck: JVD elevated no mass felt. Lungs: No crackles Cardiovascular: S1-S2 heard. Abdomen: Soft nontender bowel sound present. Musculoskeletal: Bilateral lower extremity edema present. Skin: No rash. Neurologic: Alert awake oriented to time place and person.  Moves all  extremities. Psychiatric: Appears normal.  Normal affect.   Disposition: Status is: Observation The patient remains OBS appropriate and will d/c before 2 midnights.  Planned Discharge Destination: Home    Time spent: 35 minutes  Author: Tobey Grim, MD 01/04/2024 4:48 PM  For on call review [email or web address].

## 2024-01-04 NOTE — ED Triage Notes (Signed)
 Pt is from home brought in via EMS with SOB, off lasix now LE edema. Hx cancer on chemo O2 88% RA; 95% 2L [place]; HR 60, BP 118/70 RR 22 CBG 116

## 2024-01-05 DIAGNOSIS — I429 Cardiomyopathy, unspecified: Secondary | ICD-10-CM | POA: Diagnosis not present

## 2024-01-05 DIAGNOSIS — I301 Infective pericarditis: Secondary | ICD-10-CM | POA: Diagnosis present

## 2024-01-05 DIAGNOSIS — F419 Anxiety disorder, unspecified: Secondary | ICD-10-CM | POA: Diagnosis present

## 2024-01-05 DIAGNOSIS — C8800 Waldenstrom macroglobulinemia not having achieved remission: Secondary | ICD-10-CM | POA: Diagnosis present

## 2024-01-05 DIAGNOSIS — R0781 Pleurodynia: Secondary | ICD-10-CM | POA: Diagnosis not present

## 2024-01-05 DIAGNOSIS — E876 Hypokalemia: Secondary | ICD-10-CM | POA: Diagnosis not present

## 2024-01-05 DIAGNOSIS — I5021 Acute systolic (congestive) heart failure: Secondary | ICD-10-CM | POA: Diagnosis not present

## 2024-01-05 DIAGNOSIS — E872 Acidosis, unspecified: Secondary | ICD-10-CM | POA: Diagnosis present

## 2024-01-05 DIAGNOSIS — I3139 Other pericardial effusion (noninflammatory): Secondary | ICD-10-CM | POA: Diagnosis not present

## 2024-01-05 DIAGNOSIS — I428 Other cardiomyopathies: Secondary | ICD-10-CM | POA: Diagnosis present

## 2024-01-05 DIAGNOSIS — I5020 Unspecified systolic (congestive) heart failure: Secondary | ICD-10-CM | POA: Diagnosis not present

## 2024-01-05 DIAGNOSIS — F32A Depression, unspecified: Secondary | ICD-10-CM | POA: Diagnosis present

## 2024-01-05 DIAGNOSIS — Z888 Allergy status to other drugs, medicaments and biological substances status: Secondary | ICD-10-CM | POA: Diagnosis not present

## 2024-01-05 DIAGNOSIS — I471 Supraventricular tachycardia, unspecified: Secondary | ICD-10-CM | POA: Diagnosis not present

## 2024-01-05 DIAGNOSIS — E871 Hypo-osmolality and hyponatremia: Secondary | ICD-10-CM | POA: Diagnosis present

## 2024-01-05 DIAGNOSIS — I110 Hypertensive heart disease with heart failure: Secondary | ICD-10-CM | POA: Diagnosis present

## 2024-01-05 DIAGNOSIS — G8929 Other chronic pain: Secondary | ICD-10-CM | POA: Diagnosis present

## 2024-01-05 DIAGNOSIS — I5043 Acute on chronic combined systolic (congestive) and diastolic (congestive) heart failure: Secondary | ICD-10-CM | POA: Diagnosis present

## 2024-01-05 DIAGNOSIS — Z79899 Other long term (current) drug therapy: Secondary | ICD-10-CM | POA: Diagnosis not present

## 2024-01-05 DIAGNOSIS — Z885 Allergy status to narcotic agent status: Secondary | ICD-10-CM | POA: Diagnosis not present

## 2024-01-05 DIAGNOSIS — Z79891 Long term (current) use of opiate analgesic: Secondary | ICD-10-CM | POA: Diagnosis not present

## 2024-01-05 DIAGNOSIS — Z8249 Family history of ischemic heart disease and other diseases of the circulatory system: Secondary | ICD-10-CM | POA: Diagnosis not present

## 2024-01-05 DIAGNOSIS — R601 Generalized edema: Secondary | ICD-10-CM | POA: Diagnosis present

## 2024-01-05 DIAGNOSIS — R079 Chest pain, unspecified: Secondary | ICD-10-CM | POA: Diagnosis not present

## 2024-01-05 DIAGNOSIS — N179 Acute kidney failure, unspecified: Secondary | ICD-10-CM | POA: Diagnosis present

## 2024-01-05 DIAGNOSIS — E875 Hyperkalemia: Secondary | ICD-10-CM | POA: Diagnosis present

## 2024-01-05 DIAGNOSIS — I959 Hypotension, unspecified: Secondary | ICD-10-CM | POA: Diagnosis not present

## 2024-01-05 DIAGNOSIS — F17290 Nicotine dependence, other tobacco product, uncomplicated: Secondary | ICD-10-CM | POA: Diagnosis present

## 2024-01-05 DIAGNOSIS — Z5986 Financial insecurity: Secondary | ICD-10-CM | POA: Diagnosis not present

## 2024-01-05 DIAGNOSIS — Z1152 Encounter for screening for COVID-19: Secondary | ICD-10-CM | POA: Diagnosis not present

## 2024-01-05 LAB — BASIC METABOLIC PANEL WITH GFR
Anion gap: 11 (ref 5–15)
BUN: 23 mg/dL (ref 8–23)
CO2: 29 mmol/L (ref 22–32)
Calcium: 8.3 mg/dL — ABNORMAL LOW (ref 8.9–10.3)
Chloride: 98 mmol/L (ref 98–111)
Creatinine, Ser: 1.2 mg/dL (ref 0.61–1.24)
GFR, Estimated: >60 mL/min (ref >60)
Glucose, Bld: 92 mg/dL (ref 70–99)
Potassium: 3 mmol/L — ABNORMAL LOW (ref 3.5–5.1)
Sodium: 138 mmol/L (ref 135–145)

## 2024-01-05 LAB — CBC
HCT: 38.7 % — ABNORMAL LOW (ref 39.0–52.0)
Hemoglobin: 12.1 g/dL — ABNORMAL LOW (ref 13.0–17.0)
MCH: 26.3 pg (ref 26.0–34.0)
MCHC: 31.3 g/dL (ref 30.0–36.0)
MCV: 84.1 fL (ref 80.0–100.0)
Platelets: 280 10*3/uL (ref 150–400)
RBC: 4.6 MIL/uL (ref 4.22–5.81)
RDW: 16.6 % — ABNORMAL HIGH (ref 11.5–15.5)
WBC: 9.9 10*3/uL (ref 4.0–10.5)
nRBC: 0 % (ref 0.0–0.2)

## 2024-01-05 LAB — MRSA NEXT GEN BY PCR, NASAL: MRSA by PCR Next Gen: DETECTED — AB

## 2024-01-05 LAB — HIV ANTIBODY (ROUTINE TESTING W REFLEX): HIV Screen 4th Generation wRfx: NONREACTIVE

## 2024-01-05 MED ORDER — POTASSIUM CHLORIDE CRYS ER 20 MEQ PO TBCR
40.0000 meq | EXTENDED_RELEASE_TABLET | Freq: Once | ORAL | Status: AC
  Administered 2024-01-05: 40 meq via ORAL
  Filled 2024-01-05: qty 2

## 2024-01-05 MED ORDER — DOXYCYCLINE HYCLATE 100 MG PO TABS
100.0000 mg | ORAL_TABLET | Freq: Two times a day (BID) | ORAL | Status: DC
  Administered 2024-01-05: 100 mg via ORAL
  Filled 2024-01-05: qty 1

## 2024-01-05 NOTE — Progress Notes (Signed)
 Rounding Note    Patient Name: Connor Solomon Date of Encounter: 01/05/2024  [facility] Cardiologist: Arvilla Meres, MD   Subjective   Pt is breathing some better    No CP Ankles improved   Inpatient Medications    Scheduled Meds:  carvedilol  3.125 mg Oral BID   enoxaparin (LOVENOX) injection  40 mg Subcutaneous Q24H   ferrous sulfate  325 mg Oral Q breakfast   furosemide  80 mg Intravenous BID   methadone  10 mg Oral Q8H   potassium chloride SA  40 mEq Oral Daily   Continuous Infusions:  cefTRIAXone (ROCEPHIN)  IV Stopped (01/04/24 0901)   doxycycline (VIBRAMYCIN) IV 100 mg (01/04/24 2140)   PRN Meds: diazepam, oxyCODONE   Vital Signs    Vitals:   01/04/24 2045 01/04/24 2140 01/05/24 0152 01/05/24 0556  BP: 101/66 118/71 110/76 97/71  Pulse: 66 86 63 85  Resp: 17 18 18 18   Temp:  98.4 F (36.9 C) 97.6 F (36.4 C) 97.6 F (36.4 C)  TempSrc:   Oral   SpO2: 98% 100% 100% 100%  Weight:      Height:        Intake/Output Summary (Last 24 hours) at 01/05/2024 0711 Last data filed at 01/05/2024 0555 Gross per 24 hour  Intake 1346.22 ml  Output 1100 ml  Net 246.22 ml      01/04/2024    4:08 AM 12/09/2023   11:44 AM 11/11/2023    8:40 AM  Last 3 Weights  Weight (lbs) 134 lb 130 lb 6.4 oz 130 lb 4.8 oz  Weight (kg) 60.782 kg 59.149 kg 59.104 kg      Telemetry    SR with PVC, PAC   - Personally Reviewed  ECG     - Personally Reviewed  Physical Exam   GEN: Thin 63 yo in no acute distress.   Neck: JVP is increased  Cardiac: RRR, no murmurs  Respiratory: Mild rales at based  GI: Soft, nontender, non-distended  MS: 1+ Le  edema;   Labs    High Sensitivity Troponin:   Recent Labs  Lab 01/04/24 0425 01/04/24 0628  TROPONINIHS 50* 21*     Chemistry Recent Labs  Lab 01/04/24 0425 01/04/24 0755 01/05/24 0357  NA 135 141 138  K 4.7 3.2* 3.0*  CL 104 112* 98  CO2 23 19* 29  GLUCOSE 117* 73 92  BUN 21 18 23   CREATININE 1.33*  0.97 1.20  CALCIUM 8.6* 6.5* 8.3*  MG  --  1.6*  --   PROT 6.0*  --   --   ALBUMIN 3.0*  --   --   AST 25  --   --   ALT 47*  --   --   ALKPHOS 101  --   --   BILITOT 1.1  --   --   GFRNONAA >60 >60 >60  ANIONGAP 8 10 11     Lipids No results for input(s): "CHOL", "TRIG", "HDL", "LABVLDL", "LDLCALC", "CHOLHDL" in the last 168 hours.  Hematology Recent Labs  Lab 01/04/24 0425 01/04/24 0755 01/05/24 0357  WBC 10.7* 9.1 9.9  RBC 5.20 5.22 4.60  HGB 13.5 13.5 12.1*  HCT 44.3 45.2 38.7*  MCV 85.2 86.6 84.1  MCH 26.0 25.9* 26.3  MCHC 30.5 29.9* 31.3  RDW 16.8* 16.7* 16.6*  PLT 401* 368 280   Thyroid  Recent Labs  Lab 01/04/24 0755  TSH 1.545    BNP Recent Labs  Lab 01/04/24 0425  BNP 1,647.7*    DDimer  Recent Labs  Lab 01/04/24 0755  DDIMER 4.91*     Radiology    VAS [place] LOWER EXTREMITY VENOUS (DVT) Result Date: 01/04/2024  Lower Venous DVT Study Patient Name:  JEMAR PAULSEN  Date of Exam:   01/04/2024 Medical Rec #: [id number]      Accession #:    [id number] Date of Birth: Feb 16, 1961      Patient Gender: M Patient Age:   63 years Exam Location:  [facility] Procedure:      VAS [place] LOWER EXTREMITY VENOUS (DVT) Referring Phys: Midge Minium --------------------------------------------------------------------------------  Indications: Edema.  Risk Factors: DVT Hx. Anticoagulation: Lovenox. Comparison Study: None. Performing Technologist: Shona Simpson  Examination Guidelines: A complete evaluation includes B-mode imaging, spectral Doppler, color Doppler, and power Doppler as needed of all accessible portions of each vessel. Bilateral testing is considered an integral part of a complete examination. Limited examinations for reoccurring indications may be performed as noted. The reflux portion of the exam is performed with the patient in reverse Trendelenburg.  +---------+---------------+---------+-----------+----------+--------------+ RIGHT     CompressibilityPhasicitySpontaneityPropertiesThrombus Aging +---------+---------------+---------+-----------+----------+--------------+ CFV      Full           Yes      Yes                                 +---------+---------------+---------+-----------+----------+--------------+ SFJ      Full                                                        +---------+---------------+---------+-----------+----------+--------------+ FV Prox  Full                                                        +---------+---------------+---------+-----------+----------+--------------+ FV Mid   Full                                                        +---------+---------------+---------+-----------+----------+--------------+ FV DistalFull                                                        +---------+---------------+---------+-----------+----------+--------------+ PFV      Full                                                        +---------+---------------+---------+-----------+----------+--------------+ POP      Full           Yes      Yes                                 +---------+---------------+---------+-----------+----------+--------------+  PTV      Full                                                        +---------+---------------+---------+-----------+----------+--------------+ PERO     Full                                                        +---------+---------------+---------+-----------+----------+--------------+   +---------+---------------+---------+-----------+----------+--------------+ LEFT     CompressibilityPhasicitySpontaneityPropertiesThrombus Aging +---------+---------------+---------+-----------+----------+--------------+ CFV      Full           Yes      Yes                                 +---------+---------------+---------+-----------+----------+--------------+ SFJ      Full                                                         +---------+---------------+---------+-----------+----------+--------------+ FV Prox  Full                                                        +---------+---------------+---------+-----------+----------+--------------+ FV Mid   Full                                                        +---------+---------------+---------+-----------+----------+--------------+ FV DistalFull                                                        +---------+---------------+---------+-----------+----------+--------------+ PFV      Full                                                        +---------+---------------+---------+-----------+----------+--------------+ POP      Full           Yes      Yes                                 +---------+---------------+---------+-----------+----------+--------------+ PTV      Full                                                        +---------+---------------+---------+-----------+----------+--------------+  PERO     Full                                                        +---------+---------------+---------+-----------+----------+--------------+     Summary: BILATERAL: - No evidence of deep vein thrombosis seen in the lower extremities, bilaterally. -No evidence of popliteal cyst, bilaterally.   *See table(s) above for measurements and observations. Electronically signed by Coral Else MD on 01/04/2024 at 10:04:37 PM.    Final    CT Angio Chest Pulmonary Embolism (PE) W or WO Contrast Result Date: 01/04/2024 CLINICAL DATA:  Dyspnea with elevated D-dimer EXAM: CT ANGIOGRAPHY CHEST WITH CONTRAST TECHNIQUE: Multidetector CT imaging of the chest was performed using the standard protocol during bolus administration of intravenous contrast. Multiplanar CT image reconstructions and MIPs were obtained to evaluate the vascular anatomy. RADIATION DOSE REDUCTION: This exam was performed according to the departmental  dose-optimization program which includes automated exposure control, adjustment of the mA and/or kV according to patient size and/or use of iterative reconstruction technique. CONTRAST:  75mL OMNIPAQUE IOHEXOL 350 MG/ML SOLN COMPARISON:  05/12/2021 FINDINGS: Cardiovascular: Atherosclerotic calcifications of the thoracic aorta are noted. Mild dilatation of the ascending aorta to 4 cm is noted. The pulmonary artery shows a normal branching pattern bilaterally. No intraluminal filling defect to suggest pulmonary embolism is noted. Mild coronary calcifications are seen. The heart is enlarged in size. Minimal pericardial effusion is noted. Mediastinum/Nodes: Thoracic inlet is within normal limits. No hilar or mediastinal adenopathy is noted. The esophagus as visualized is within normal limits. Lungs/Pleura: Lungs are well aerated bilaterally. Bilateral moderate to large pleural effusions are noted with some compressive atelectasis in the bases. Scattered ground-glass opacities are noted in the upper lobes on the left on image number 32 of series 13 and on the right on image number 64 of series 13. These were not present on the prior exam enter likely inflammatory in nature. The right-sided one measures up to 8 mm. Upper Abdomen: Visualized upper abdomen is within normal limits. Musculoskeletal: Degenerative changes of the thoracic spine are noted. No acute bony abnormality is noted. Review of the MIP images confirms the above findings. IMPRESSION: No evidence of pulmonary emboli. 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 Recommend annual imaging followup by CTA or MRA. This recommendation follows 2010 ACCF/AHA/AATS/ACR/ASA/SCA/SCAI/SIR/STS/SVM Guidelines for the Diagnosis and Management of Patients with Thoracic Aortic Disease. Circulation. 2010; 121: W098-J191. Aortic aneurysm NOS (ICD10-I71.9) Patchy ground-glass opacity in the upper lobes bilaterally as described. These are likely postinflammatory in nature. Initial  follow-up with CT at 6 months is recommended to confirm persistence. If persistent, repeat CT is recommended every 2 years until 5 years of stability has been established. This recommendation follows the consensus statement: Guidelines for Management of Incidental Pulmonary Nodules Detected on CT Images: From the Fleischner Society 2017; Radiology 2017; 284:228-243. Aortic Atherosclerosis (ICD10-I70.0). Electronically Signed   By: Alcide Clever M.D.   On: 01/04/2024 20:03   ECHOCARDIOGRAM COMPLETE Result Date: 01/04/2024    ECHOCARDIOGRAM REPORT   Patient Name:   DERRAL COLUCCI Date of Exam: 01/04/2024 Medical Rec #:  [id number]     Height:       68.0 in Accession #:    [id number]    Weight:       134.0 lb Date of Birth:  1961/08/02     BSA:          1.724 m Patient Age:    62 years      BP:           118/72 mmHg Patient Gender: M             HR:           66 bpm. Exam Location:  Inpatient Procedure: 2D Echo, 3D Echo, Cardiac Doppler, Color Doppler, Strain Analysis and            Intracardiac Opacification Agent (Both Spectral and Color Flow            Doppler were utilized during procedure). Indications:    Congestive Heart Failure  History:        Patient has prior history of Echocardiogram examinations, most                 recent 09/14/2021. Risk Factors:Hypertension and Former Smoker.  Sonographer:    Karma Ganja Referring Phys: [id number] Eduard Clos  Sonographer Comments: Global longitudinal strain was attempted. IMPRESSIONS  1. Left ventricular ejection fraction, by estimation, is <20%. Left ventricular ejection fraction by 3D volume is 26 %. The left ventricle has severely decreased function. The left ventricle has no regional wall motion abnormalities. The left ventricular internal cavity size was moderately dilated. Left ventricular diastolic parameters are consistent with Grade III diastolic dysfunction (restrictive). The average left ventricular global longitudinal strain is -3.9 %. The global  longitudinal strain is abnormal.  2. Right ventricular systolic function is normal. The right ventricular size is normal.  3. Left atrial size was severely dilated.  4. Right atrial size was mildly dilated.  5. The mitral valve is normal in structure. Mild mitral valve regurgitation. No evidence of mitral stenosis.  6. The aortic valve is normal in structure. Aortic valve regurgitation is trivial. No aortic stenosis is present.  7. The inferior vena cava is normal in size with <50% respiratory variability, suggesting right atrial pressure of 8 mmHg. Comparison(s): Prior images reviewed side by side. FINDINGS  Left Ventricle: Left ventricular ejection fraction, by estimation, is <20%. Left ventricular ejection fraction by 3D volume is 26 %. The left ventricle has severely decreased function. The left ventricle has no regional wall motion abnormalities. Definity contrast agent was given IV to delineate the left ventricular endocardial borders. The average left ventricular global longitudinal strain is -3.9 %. Strain was performed and the global longitudinal strain is abnormal. 3D ejection fraction reviewed and evaluated as part of the interpretation. Alternate measurement of EF is felt to be most reflective of LV function. The left ventricular internal cavity size was moderately dilated. There is no left ventricular hypertrophy. Left ventricular diastolic parameters are consistent with Grade III diastolic dysfunction (restrictive). Right Ventricle: The right ventricular size is normal. No increase in right ventricular wall thickness. Right ventricular systolic function is normal. Left Atrium: Left atrial size was severely dilated. Right Atrium: Right atrial size was mildly dilated. Pericardium: There is no evidence of pericardial effusion. Mitral Valve: The mitral valve is normal in structure. Mild mitral valve regurgitation. No evidence of mitral valve stenosis. Tricuspid Valve: The tricuspid valve is normal in  structure. Tricuspid valve regurgitation is not demonstrated. No evidence of tricuspid stenosis. Aortic Valve: The aortic valve is normal in structure. Aortic valve regurgitation is trivial. No aortic stenosis is present. Aortic valve mean gradient measures 3.0 mmHg. Aortic valve peak gradient measures 5.2 mmHg. Aortic  valve area, by VTI measures 1.84 cm. Pulmonic Valve: The pulmonic valve was normal in structure. Pulmonic valve regurgitation is not visualized. No evidence of pulmonic stenosis. Aorta: The aortic root is normal in size and structure. Venous: The inferior vena cava is normal in size with less than 50% respiratory variability, suggesting right atrial pressure of 8 mmHg. IAS/Shunts: No atrial level shunt detected by color flow Doppler. Additional Comments: 3D was performed not requiring image post processing on an independent workstation and was abnormal.  LEFT VENTRICLE PLAX 2D LVIDd:         6.30 cm         Diastology LVIDs:         6.10 cm         LV e' medial:    3.81 cm/s LV PW:         0.90 cm         LV E/e' medial:  13.3 LV IVS:        0.90 cm         LV e' lateral:   7.07 cm/s LVOT diam:     2.10 cm         LV E/e' lateral: 7.2 LV SV:         31 LV SV Index:   18              2D Longitudinal LVOT Area:     3.46 cm        Strain                                2D Strain GLS   -3.9 %                                Avg: LV Volumes (MOD) LV vol d, MOD    244.0 ml      3D Volume EF A2C:                           LV 3D EF:    Left LV vol d, MOD    252.0 ml                   ventricul A4C:                                        ar LV vol s, MOD    193.0 ml                   ejection A2C:                                        fraction LV vol s, MOD    217.0 ml                   by 3D A4C:                                        volume is LV SV MOD A2C:   51.0 ml  26 %. LV SV MOD A4C:   252.0 ml LV SV MOD BP:    48.3 ml                                3D Volume EF:                                 3D EF:        26 %                                LV EDV:       261 ml                                LV ESV:       194 ml                                LV SV:        67 ml RIGHT VENTRICLE            IVC RV Basal diam:  3.50 cm    IVC diam: 1.90 cm RV S prime:     7.72 cm/s TAPSE (M-mode): 1.5 cm LEFT ATRIUM              Index        RIGHT ATRIUM           Index LA diam:        5.40 cm  3.13 cm/m   RA Area:     21.40 cm LA Vol (A2C):   119.0 ml 69.03 ml/m  RA Volume:   64.80 ml  37.59 ml/m LA Vol (A4C):   92.8 ml  53.83 ml/m LA Biplane Vol: 108.0 ml 62.65 ml/m  AORTIC VALVE AV Area (Vmax):    2.11 cm AV Area (Vmean):   1.88 cm AV Area (VTI):     1.84 cm AV Vmax:           114.00 cm/s AV Vmean:          77.400 cm/s AV VTI:            0.167 m AV Peak Grad:      5.2 mmHg AV Mean Grad:      3.0 mmHg LVOT Vmax:         69.40 cm/s LVOT Vmean:        42.000 cm/s LVOT VTI:          0.089 m LVOT/AV VTI ratio: 0.53  AORTA Ao Root diam: 3.50 cm MITRAL VALVE MV Area (PHT): 3.99 cm    SHUNTS MV Decel Time: 190 msec    Systemic VTI:  0.09 m MR Peak grad: 69.4 mmHg    Systemic Diam: 2.10 cm MR Mean grad: 39.0 mmHg MR Vmax:      416.50 cm/s MR Vmean:     287.0 cm/s MV E velocity: 50.60 cm/s MV A velocity: 73.30 cm/s MV E/A ratio:  0.69 Donato Schultz MD Electronically signed by Donato Schultz MD Signature Date/Time: 01/04/2024/3:16:04 PM    Final    US RENAL Result Date: 01/04/2024 CLINICAL DATA:  Acute renal failure EXAM: RENAL / URINARY  TRACT ULTRASOUND COMPLETE COMPARISON:  Abdominal CT 04/07/2021 FINDINGS: Right Kidney: Renal measurements: 10 x 5.4 x 5.8 cm = volume: 160 mL. Mildly echogenic cortex with corticomedullary prominence. No mass or hydronephrosis visualized. Left Kidney: Renal measurements: 9 x 5 x 5 cm = volume: 120 mL. Mildly echogenic cortex with corticomedullary prominence. 5 mm stone with twinkle artifact, nonobstructive. Mild hydronephrosis. Bladder: Full urinary bladder with some internal  echoes attributed to debris. Jets were not seen on either side. Other: Bilateral pleural effusion which is simple where covered. IMPRESSION: 1. Mild left hydronephrosis.Left nephrolithiasis. 2. Medical renal disease with mild atrophy on the left. 3. Moderate distension of the urinary bladder which contains some debris, correlate with urinalysis. Electronically Signed   By: Tiburcio Pea M.D.   On: 01/04/2024 07:59   DG Chest Portable 1 View Result Date: 01/04/2024 CLINICAL DATA:  Shortness of breath. EXAM: PORTABLE CHEST 1 VIEW COMPARISON:  07/23/2022 FINDINGS: The heart size appears enlarged which may be exacerbated by portable technique. There are small bilateral pleural effusions left greater than right. Increase interstitial markings are noted bilaterally. Right upper and bilateral lower lobe airspace opacities noted. Visualized osseous structures appear grossly intact. IMPRESSION: 1. Cardiomegaly, small bilateral pleural effusions and increased interstitial markings compatible with congestive heart failure. 2. Right upper and bilateral lower lobe airspace opacities may reflect edema or pneumonia. Electronically Signed   By: Signa Kell M.D.   On: 01/04/2024 05:44    Cardiac Studies    Echo   January 04, 2024   1. Left ventricular ejection fraction, by estimation, is <20%. Left  ventricular ejection fraction by 3D volume is 26 %. The left ventricle has  severely decreased function. The left ventricle has no regional wall  motion abnormalities. The left  ventricular internal cavity size was moderately dilated. Left ventricular  diastolic parameters are consistent with Grade III diastolic dysfunction  (restrictive). The average left ventricular global longitudinal strain is  -3.9 %. The global longitudinal  strain is abnormal.   2. Right ventricular systolic function is normal. The right ventricular  size is normal.   3. Left atrial size was severely dilated.   4. Right atrial size was  mildly dilated.   5. The mitral valve is normal in structure. Mild mitral valve  regurgitation. No evidence of mitral stenosis.   6. The aortic valve is normal in structure. Aortic valve regurgitation is  trivial. No aortic stenosis is present.   7. The inferior vena cava is normal in size with <50% respiratory  variability, suggesting right atrial pressure of 8 mmHg.   Comparison(s): Prior images reviewed side by side.  Echocardiogram 09/14/2021 1. Left ventricular ejection fraction, by estimation, is 25%. The left  ventricle has normal function. The left ventricle demonstrates global  hypokinesis. The left ventricular internal cavity size was moderately  dilated. Left ventricular diastolic  parameters are consistent with Grade I diastolic dysfunction (impaired  relaxation).   2. Right ventricular systolic function is normal. The right ventricular  size is normal.   3. Left atrial size was moderately dilated.   4. Right atrial size was mildly dilated.   5. The mitral valve is normal in structure. Mild mitral valve  regurgitation. No evidence of mitral stenosis.   6. The aortic valve is tricuspid. Aortic valve regurgitation is mild. No  aortic stenosis is present.   7. Aortic dilatation noted. There is borderline dilatation of the  ascending aorta, measuring 38 mm.   8. The inferior vena cava  is normal in size with greater than 50%  respiratory variability, suggesting right atrial pressure of 3 mmHg.    Cardiac MRI 04/10/2021 1.  Mild LV dilatation with severe systolic dysfunction (EF 25%)   2.  Normal RV size and systolic function (EF 49%)   3. Basal septal midwall late gadolinium enhancement, which is a nonspecific scar pattern seen in nonischemic cardiomyopathies and associated with a worse prognosis   4.  Mild mitral regurgitation (regurgitant fraction 18%)   Right left heart catheterization 04/09/2021 Ao =98/68 (82) LV = 81/3 RA = 1 RV = 27/2 PA = 25/8 (16) PCW =  10 Fick cardiac output/index = 5.8/3.2 PVR = 1.0 WU Ao sat = 100% PA sat = 66%, 70%   Assessment: 1. Normal coronary arteries 2. 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 Low filling pressures with normal cardiac output    Patient Profile      TYJAY GALINDO is a 63 y.o. male with a hx of nonischemic cardiomyopathy with reduced EF, Waldenstrm's macroglobulinemia, IgM monoclonal gammopathy, HTN who is being seen 01/04/2024 for the evaluation of CHF exacerbation at the request of Dr. Gwendolyn Grant.   Assessment & Plan    1  NICM   Pt presented yesterday with worsening SOB , edema    He says he was told to not take a fluid pill   I cannot see this order He is diuresing now   Feeling some better  Echo done about noon yesterday shows LVEF severely depressed   Probably 20 to 25% I do think it is a little worse than echo from 2022   Note is was more volume overloaded when it was done so this could effect findings   REcomm    Would continue IV diuresis  BP around 100   Not on other GDMT agents    Follow    Add as BP improves   2  Chest pain   No symptoms ot sugg ischemia  3   Waldenstrom's macroglobulinemia    Followed by oncology  On Ibrutinib     For questions or updates, please contact [facility] Please consult [email or web address] for contact info under        Signed, Dietrich Pates, MD  01/05/2024, 7:11 AM

## 2024-01-05 NOTE — Plan of Care (Signed)

## 2024-01-05 NOTE — TOC Initial Note (Signed)
 Transition of Care [facility]) - Initial/Assessment Note   Patient Details  Name: Connor Solomon MRN: [id number] Date of Birth: [birth year]  Transition of Care [facility]) CM/SW Contact:    Ewing Schlein, LCSW Phone Number: 01/05/2024, 11:23 AM  Clinical Narrative: [facility] consulted for CHF screening. However, patient was already referred to the heart failure navigation team and was screened out as patient is already active with Dr. Gala Romney. Consult will be cleared at this time.                Expected Discharge Plan: Home/Self Care Barriers to Discharge: Continued Medical Work up  Patient Goals and CMS Choice Patient states their goals for this hospitalization and ongoing recovery are:: Find out how to get additional benefits through Medicare Choice offered to / list presented to : NA  Expected Discharge Plan and Services In-house Referral: Clinical Social Work Post Acute Care Choice: NA Living arrangements for the past 2 months: Mobile Home             DME Arranged: N/A DME Agency: NA  Prior Living Arrangements/Services Living arrangements for the past 2 months: Mobile Home Patient language and need for interpreter reviewed:: Yes Do you feel safe going back to the place where you live?: Yes      Need for Family Participation in Patient Care: No (Comment) Care giver support system in place?: Yes (comment) Criminal Activity/Legal Involvement Pertinent to Current Situation/Hospitalization: No - Comment as needed  Activities of Daily Living ADL Screening (condition at time of admission) Independently performs ADLs?: Yes (appropriate for developmental age) Is the patient deaf or have difficulty hearing?: No Does the patient have difficulty seeing, even when wearing glasses/contacts?: No Does the patient have difficulty concentrating, remembering, or making decisions?: No  Emotional Assessment Appearance:: Appears stated age Attitude/Demeanor/Rapport: Engaged Affect (typically observed):  Appropriate Orientation: : Oriented to Self, Oriented to Place, Oriented to  Time, Oriented to Situation Alcohol / Substance Use: Not Applicable Psych Involvement: No (comment)  Admission diagnosis:  Anasarca [R60.1] Acute pulmonary edema (HCC) [J81.0] Acute CHF (congestive heart failure) (HCC) [I50.9] Patient Active Problem List   Diagnosis Date Noted   Acute CHF (congestive heart failure) (HCC) 01/04/2024   Acute on chronic HFrEF (heart failure with reduced ejection fraction) (HCC) 01/04/2024   ARF (acute renal failure) (HCC) 01/04/2024   Elevated LFTs 01/04/2024   Finger infection 12/24/2021   Nausea with vomiting 09/17/2021   Pain and swelling of left upper extremity 07/16/2021   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 06/29/2021   Dyspnea 06/11/2021   Gynecomastia 06/04/2021   Osteoarthritis (arthritis due to wear and tear of joints) 06/04/2021   Screening for depression 06/04/2021   Waldenstrom macroglobulinemia 05/24/2021   Rash in adult 05/18/2021   Tinnitus aurium, 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   HFrEF (heart failure with reduced ejection fraction) (HCC) 04/20/2021   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 [address] Phone: [phone] Fax: [phone]  [facility] - [place], [place] - [address] [place] [address] Phone: [phone] Fax: [phone]  Social Drivers of Health (SDOH) Social History: SDOH Screenings   Food  Insecurity: No Food Insecurity (01/04/2024)  Housing: Low Risk  (01/04/2024)  Transportation Needs: No Transportation Needs (01/04/2024)  Utilities: Not At Risk  (01/04/2024)  Depression (PHQ2-9): Low Risk  (07/16/2021)  Recent Concern: Depression (PHQ2-9) - Medium Risk (06/03/2021)  Financial Resource Strain: High Risk (07/17/2021)  Stress: Stress Concern Present (09/01/2021)  Tobacco Use: Medium Risk (01/04/2024)   SDOH Interventions: Housing Interventions: Inpatient TOC, Intervention Not Indicated  Readmission Risk Interventions     No data to display

## 2024-01-05 NOTE — Care Management Obs Status (Signed)
 MEDICARE OBSERVATION STATUS NOTIFICATION   Patient Details  Name: Connor Solomon MRN: [id number] Date of Birth: Sep 19, 1961   Medicare Observation Status Notification Given:  Yes    Ewing Schlein, LCSW 01/05/2024, 10:49 AM

## 2024-01-05 NOTE — Progress Notes (Addendum)
  Progress Note   Patient: Connor Solomon ZOX:[id number] DOB: August 02, 1961 DOA: 01/04/2024     0 DOS: the patient was seen and examined on 01/05/2024   Brief hospital course: Connor Solomon is a 63 y.o. male with history of Waldenstrm's macroglobulinemia, nonischemic cardiomyopathy last EF measured was 25% with grade 1 diastolic dysfunction on 12/22, hypertension, anxiety, chronic pain presents to the ER because of worsening shortness of breath.  Patient is being treated for Waldenstrm's macroglobulinemia by Dr. Leonides Schanz oncologist and is on ibrutinib/rituximab.  4 to 5 days prior to admission reported increasing exertional dyspnea and increasing lower extremity edema.  Patient states he used to be on Lasix which was discontinued few months ago.  Has some chest tightness and also had recent upper respiratory infection and a month ago also had some palpitations.  Came to ER for same symptoms and was admitted for presumed CHF exacerbation.  Assessment and Plan: Acute on chronic HFrEF  -Working etiology is stopping his diuretics outpatient.  Reports he was told by his outpatient physician to stop his Lasix. -Feels better today than he has been. -Echo from yesterday showed last EF reported as <20%.   -Continues to diurese well.  Appreciate cardiology input.  Will continue IV diuresis. -On beta-blocker.  Blood pressure systolic around 100.  Not lightheaded when he gets up to walk around the room.  Shortness of breath: -Secondary to CHF exacerbation secondary to not taking outpatient diuretics. -Procalcitonin negative.  Stopping antibiotics.  CTA from yesterday was negative for PE.  Acute renal failure  -Resolved. -Will follow creatinine in light of ongoing diuresis.  Elevated troponin -No further issues with chest pain. -Likely strain from CHF.  No acute ischemic changes on EKG. -Of note his troponins do appear to be chronically elevated.  Waldenstrm's macroglobulinemia  - being followed by  oncologist.  On ibrutinib and rituximab.  Hypokalemia: -Will schedule potassium while in house and while being monitored in light of ongoing diuretics.  Chronic pain  - on methadone and oxycodone.  Medications confirmed on PDMP website.  Mildly elevated LFTs  -Secondary to CHF exacerbation - recheck in AM  Metabolic acidosis: -Resolved with ongoing diuresis.     Subjective: Shortness of breath much improved.  He can walk to the bathroom back but still becomes dyspneic with very end of his walk.  Good urine output.  No further chest pain.  Occasional cough.  Physical Exam: Vitals:   01/05/24 0556 01/05/24 0710 01/05/24 0839 01/05/24 1309  BP: 97/71  104/64 97/78  Pulse: 85  87 85  Resp: 18  17 19   Temp: 97.6 F (36.4 C)  97.8 F (36.6 C) 97.7 F (36.5 C)  TempSrc:   Oral Oral  SpO2: 100%  95% 99%  Weight:  57.4 kg    Height:       Eyes: Anicteric no pallor. ENMT: No discharge from the ears eyes nose or mouth. Neck: JVD elevated Lungs: Mild crackles at bases Cardiovascular: S1-S2 heard. Abdomen: Soft nontender bowel sound present. Extremities: +2 edema bilateral lower extremities to about mid shins Skin: No rash. Neurologic: Alert awake oriented to time place and person.  Moves all extremities. Psychiatric: Appears normal.  Normal affect.   Disposition: Status is: Inpatient Dispo: Continue IV diuresis  Planned Discharge Destination: Home    Time spent: 35 minutes  Author: Tobey Grim, MD 01/05/2024 2:16 PM  For on call review [email or web address].

## 2024-01-05 NOTE — Progress Notes (Signed)
 Review of rhythm strips at Dr. Tyson Alias request shows recurrent brief bursts of supraventricular tachycardia (narrow QRS).  He is also having a lot of muscle cramps.  Potassium is only 3.0 and he has received 40 mill equivalents of potassium chloride earlier today.  Suspect he is still hypokalemic.  Will give another 40 mill equivalents of potassium supplementation.  Check basic metabolic panel in AM.  No antiarrhythmics are indicated at this time, continue carvedilol.

## 2024-01-06 DIAGNOSIS — I5021 Acute systolic (congestive) heart failure: Secondary | ICD-10-CM | POA: Diagnosis not present

## 2024-01-06 DIAGNOSIS — I5020 Unspecified systolic (congestive) heart failure: Secondary | ICD-10-CM

## 2024-01-06 LAB — BASIC METABOLIC PANEL WITH GFR
Anion gap: 13 (ref 5–15)
BUN: 21 mg/dL (ref 8–23)
CO2: 28 mmol/L (ref 22–32)
Calcium: 8.2 mg/dL — ABNORMAL LOW (ref 8.9–10.3)
Chloride: 94 mmol/L — ABNORMAL LOW (ref 98–111)
Creatinine, Ser: 1.09 mg/dL (ref 0.61–1.24)
GFR, Estimated: >60 mL/min (ref >60)
Glucose, Bld: 84 mg/dL (ref 70–99)
Potassium: 3.6 mmol/L (ref 3.5–5.1)
Sodium: 135 mmol/L (ref 135–145)

## 2024-01-06 LAB — CBC
HCT: 41.4 % (ref 39.0–52.0)
Hemoglobin: 12.7 g/dL — ABNORMAL LOW (ref 13.0–17.0)
MCH: 25.8 pg — ABNORMAL LOW (ref 26.0–34.0)
MCHC: 30.7 g/dL (ref 30.0–36.0)
MCV: 84 fL (ref 80.0–100.0)
Platelets: 287 10*3/uL (ref 150–400)
RBC: 4.93 MIL/uL (ref 4.22–5.81)
RDW: 16.9 % — ABNORMAL HIGH (ref 11.5–15.5)
WBC: 9.4 10*3/uL (ref 4.0–10.5)
nRBC: 0 % (ref 0.0–0.2)

## 2024-01-06 LAB — MAGNESIUM: Magnesium: 1.8 mg/dL (ref 1.7–2.4)

## 2024-01-06 MED ORDER — POTASSIUM CHLORIDE CRYS ER 20 MEQ PO TBCR
40.0000 meq | EXTENDED_RELEASE_TABLET | Freq: Two times a day (BID) | ORAL | Status: DC
  Administered 2024-01-06 – 2024-01-07 (×4): 40 meq via ORAL
  Filled 2024-01-06 (×4): qty 2

## 2024-01-06 MED ORDER — MAGNESIUM SULFATE 2 GM/50ML IV SOLN
2.0000 g | Freq: Once | INTRAVENOUS | Status: AC
  Administered 2024-01-06: 2 g via INTRAVENOUS
  Filled 2024-01-06: qty 50

## 2024-01-06 NOTE — Progress Notes (Addendum)
 Rounding Note    Patient Name: Connor Solomon Date of Encounter: 01/06/2024  [facility] Cardiologist: Arvilla Meres, MD   Subjective   Pt says his breathing is back to bseline    Inpatient Medications    Scheduled Meds:  carvedilol  3.125 mg Oral BID   enoxaparin (LOVENOX) injection  40 mg Subcutaneous Q24H   ferrous sulfate  325 mg Oral Q breakfast   furosemide  80 mg Intravenous BID   methadone  10 mg Oral Q8H   potassium chloride SA  40 mEq Oral BID   Continuous Infusions:  magnesium sulfate bolus IVPB 2 g (01/06/24 0824)   PRN Meds: diazepam, oxyCODONE   Vital Signs    Vitals:   01/05/24 2020 01/05/24 2142 01/06/24 0451 01/06/24 0452  BP: 111/75 116/67 105/82   Pulse: 88 (!) 48 91   Resp: 16  20   Temp: 97.7 F (36.5 C)  98.3 F (36.8 C)   TempSrc: Oral  Oral   SpO2: 97%  100%   Weight:    56.4 kg  Height:        Intake/Output Summary (Last 24 hours) at 01/06/2024 0825 Last data filed at 01/06/2024 0803 Gross per 24 hour  Intake 1680 ml  Output 2600 ml  Net -920 ml      01/06/2024    4:52 AM 01/05/2024    7:10 AM 01/04/2024    4:08 AM  Last 3 Weights  Weight (lbs) 124 lb 5.4 oz 126 lb 8.7 oz 134 lb  Weight (kg) 56.4 kg 57.4 kg 60.782 kg      Telemetry    SR  18 beat SVT  4 beat NSVT    - Personally Reviewed  ECG     - Personally Reviewed  Physical Exam   GEN: Thin 63 yo in no acute distress.   Neck: JVP normal  Cardiac: RRR, no murmurs  Respiratory:  CTA  GI: Soft, nontender, non-distended  MS: No LE  edema;   Labs    High Sensitivity Troponin:   Recent Labs  Lab 01/04/24 0425 01/04/24 0628  TROPONINIHS 50* 21*     Chemistry Recent Labs  Lab 01/04/24 0425 01/04/24 0755 01/05/24 0357 01/06/24 0328  NA 135 141 138 135  K 4.7 3.2* 3.0* 3.6  CL 104 112* 98 94*  CO2 23 19* 29 28  GLUCOSE 117* 73 92 84  BUN 21 18 23 21   CREATININE 1.33* 0.97 1.20 1.09  CALCIUM 8.6* 6.5* 8.3* 8.2*  MG  --  1.6*  --  1.8   PROT 6.0*  --   --   --   ALBUMIN 3.0*  --   --   --   AST 25  --   --   --   ALT 47*  --   --   --   ALKPHOS 101  --   --   --   BILITOT 1.1  --   --   --   GFRNONAA >60 >60 >60 >60  ANIONGAP 8 10 11 13     Lipids No results for input(s): "CHOL", "TRIG", "HDL", "LABVLDL", "LDLCALC", "CHOLHDL" in the last 168 hours.  Hematology Recent Labs  Lab 01/04/24 0755 01/05/24 0357 01/06/24 0328  WBC 9.1 9.9 9.4  RBC 5.22 4.60 4.93  HGB 13.5 12.1* 12.7*  HCT 45.2 38.7* 41.4  MCV 86.6 84.1 84.0  MCH 25.9* 26.3 25.8*  MCHC 29.9* 31.3 30.7  RDW 16.7* 16.6* 16.9*  PLT 368 280 287   Thyroid  Recent Labs  Lab 01/04/24 0755  TSH 1.545    BNP Recent Labs  Lab 01/04/24 0425  BNP 1,647.7*    DDimer  Recent Labs  Lab 01/04/24 0755  DDIMER 4.91*     Radiology    VAS [place] LOWER EXTREMITY VENOUS (DVT) Result Date: 01/04/2024  Lower Venous DVT Study Patient Name:  BRAETON WOLGAMOTT  Date of Exam:   01/04/2024 Medical Rec #: [id number]      Accession #:    [id number] Date of Birth: [birth year]      Patient Gender: M Patient Age:   32 years Exam Location:  [facility] Procedure:      VAS [place] LOWER EXTREMITY VENOUS (DVT) Referring Phys: Midge Minium --------------------------------------------------------------------------------  Indications: Edema.  Risk Factors: DVT Hx. Anticoagulation: Lovenox. Comparison Study: None. Performing Technologist: Shona Simpson  Examination Guidelines: A complete evaluation includes B-mode imaging, spectral Doppler, color Doppler, and power Doppler as needed of all accessible portions of each vessel. Bilateral testing is considered an integral part of a complete examination. Limited examinations for reoccurring indications may be performed as noted. The reflux portion of the exam is performed with the patient in reverse Trendelenburg.  +---------+---------------+---------+-----------+----------+--------------+ RIGHT     CompressibilityPhasicitySpontaneityPropertiesThrombus Aging +---------+---------------+---------+-----------+----------+--------------+ CFV      Full           Yes      Yes                                 +---------+---------------+---------+-----------+----------+--------------+ SFJ      Full                                                        +---------+---------------+---------+-----------+----------+--------------+ FV Prox  Full                                                        +---------+---------------+---------+-----------+----------+--------------+ FV Mid   Full                                                        +---------+---------------+---------+-----------+----------+--------------+ FV DistalFull                                                        +---------+---------------+---------+-----------+----------+--------------+ PFV      Full                                                        +---------+---------------+---------+-----------+----------+--------------+ POP      Full  Yes      Yes                                 +---------+---------------+---------+-----------+----------+--------------+ PTV      Full                                                        +---------+---------------+---------+-----------+----------+--------------+ PERO     Full                                                        +---------+---------------+---------+-----------+----------+--------------+   +---------+---------------+---------+-----------+----------+--------------+ LEFT     CompressibilityPhasicitySpontaneityPropertiesThrombus Aging +---------+---------------+---------+-----------+----------+--------------+ CFV      Full           Yes      Yes                                 +---------+---------------+---------+-----------+----------+--------------+ SFJ      Full                                                         +---------+---------------+---------+-----------+----------+--------------+ FV Prox  Full                                                        +---------+---------------+---------+-----------+----------+--------------+ FV Mid   Full                                                        +---------+---------------+---------+-----------+----------+--------------+ FV DistalFull                                                        +---------+---------------+---------+-----------+----------+--------------+ PFV      Full                                                        +---------+---------------+---------+-----------+----------+--------------+ POP      Full           Yes      Yes                                 +---------+---------------+---------+-----------+----------+--------------+ PTV  Full                                                        +---------+---------------+---------+-----------+----------+--------------+ PERO     Full                                                        +---------+---------------+---------+-----------+----------+--------------+     Summary: BILATERAL: - No evidence of deep vein thrombosis seen in the lower extremities, bilaterally. -No evidence of popliteal cyst, bilaterally.   *See table(s) above for measurements and observations. Electronically signed by Coral Else MD on 01/04/2024 at 10:04:37 PM.    Final    CT Angio Chest Pulmonary Embolism (PE) W or WO Contrast Result Date: 01/04/2024 CLINICAL DATA:  Dyspnea with elevated D-dimer EXAM: CT ANGIOGRAPHY CHEST WITH CONTRAST TECHNIQUE: Multidetector CT imaging of the chest was performed using the standard protocol during bolus administration of intravenous contrast. Multiplanar CT image reconstructions and MIPs were obtained to evaluate the vascular anatomy. RADIATION DOSE REDUCTION: This exam was performed according to the departmental  dose-optimization program which includes automated exposure control, adjustment of the mA and/or kV according to patient size and/or use of iterative reconstruction technique. CONTRAST:  75mL OMNIPAQUE IOHEXOL 350 MG/ML SOLN COMPARISON:  05/12/2021 FINDINGS: Cardiovascular: Atherosclerotic calcifications of the thoracic aorta are noted. Mild dilatation of the ascending aorta to 4 cm is noted. The pulmonary artery shows a normal branching pattern bilaterally. No intraluminal filling defect to suggest pulmonary embolism is noted. Mild coronary calcifications are seen. The heart is enlarged in size. Minimal pericardial effusion is noted. Mediastinum/Nodes: Thoracic inlet is within normal limits. No hilar or mediastinal adenopathy is noted. The esophagus as visualized is within normal limits. Lungs/Pleura: Lungs are well aerated bilaterally. Bilateral moderate to large pleural effusions are noted with some compressive atelectasis in the bases. Scattered ground-glass opacities are noted in the upper lobes on the left on image number 32 of series 13 and on the right on image number 64 of series 13. These were not present on the prior exam enter likely inflammatory in nature. The right-sided one measures up to 8 mm. Upper Abdomen: Visualized upper abdomen is within normal limits. Musculoskeletal: Degenerative changes of the thoracic spine are noted. No acute bony abnormality is noted. Review of the MIP images confirms the above findings. IMPRESSION: No evidence of pulmonary emboli. 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 Recommend annual imaging followup by CTA or MRA. This recommendation follows 2010 ACCF/AHA/AATS/ACR/ASA/SCA/SCAI/SIR/STS/SVM Guidelines for the Diagnosis and Management of Patients with Thoracic Aortic Disease. Circulation. 2010; 121: Z610-R604. Aortic aneurysm NOS (ICD10-I71.9) Patchy ground-glass opacity in the upper lobes bilaterally as described. These are likely postinflammatory in nature. Initial  follow-up with CT at 6 months is recommended to confirm persistence. If persistent, repeat CT is recommended every 2 years until 5 years of stability has been established. This recommendation follows the consensus statement: Guidelines for Management of Incidental Pulmonary Nodules Detected on CT Images: From the Fleischner Society 2017; Radiology 2017; 284:228-243. Aortic Atherosclerosis (ICD10-I70.0). Electronically Signed   By: Alcide Clever M.D.   On: 01/04/2024 20:03   ECHOCARDIOGRAM COMPLETE Result Date: 01/04/2024  ECHOCARDIOGRAM REPORT   Patient Name:   MASIAH WOODY Date of Exam: 01/04/2024 Medical Rec #:  [id number]     Height:       68.0 in Accession #:    [id number]    Weight:       134.0 lb Date of Birth:  December 12, 1960     BSA:          1.724 m Patient Age:    62 years      BP:           118/72 mmHg Patient Gender: M             HR:           66 bpm. Exam Location:  Inpatient Procedure: 2D Echo, 3D Echo, Cardiac Doppler, Color Doppler, Strain Analysis and            Intracardiac Opacification Agent (Both Spectral and Color Flow            Doppler were utilized during procedure). Indications:    Congestive Heart Failure  History:        Patient has prior history of Echocardiogram examinations, most                 recent 09/14/2021. Risk Factors:Hypertension and Former Smoker.  Sonographer:    Karma Ganja Referring Phys: [id number] Eduard Clos  Sonographer Comments: Global longitudinal strain was attempted. IMPRESSIONS  1. Left ventricular ejection fraction, by estimation, is <20%. Left ventricular ejection fraction by 3D volume is 26 %. The left ventricle has severely decreased function. The left ventricle has no regional wall motion abnormalities. The left ventricular internal cavity size was moderately dilated. Left ventricular diastolic parameters are consistent with Grade III diastolic dysfunction (restrictive). The average left ventricular global longitudinal strain is -3.9 %. The global  longitudinal strain is abnormal.  2. Right ventricular systolic function is normal. The right ventricular size is normal.  3. Left atrial size was severely dilated.  4. Right atrial size was mildly dilated.  5. The mitral valve is normal in structure. Mild mitral valve regurgitation. No evidence of mitral stenosis.  6. The aortic valve is normal in structure. Aortic valve regurgitation is trivial. No aortic stenosis is present.  7. The inferior vena cava is normal in size with <50% respiratory variability, suggesting right atrial pressure of 8 mmHg. Comparison(s): Prior images reviewed side by side. FINDINGS  Left Ventricle: Left ventricular ejection fraction, by estimation, is <20%. Left ventricular ejection fraction by 3D volume is 26 %. The left ventricle has severely decreased function. The left ventricle has no regional wall motion abnormalities. Definity contrast agent was given IV to delineate the left ventricular endocardial borders. The average left ventricular global longitudinal strain is -3.9 %. Strain was performed and the global longitudinal strain is abnormal. 3D ejection fraction reviewed and evaluated as part of the interpretation. Alternate measurement of EF is felt to be most reflective of LV function. The left ventricular internal cavity size was moderately dilated. There is no left ventricular hypertrophy. Left ventricular diastolic parameters are consistent with Grade III diastolic dysfunction (restrictive). Right Ventricle: The right ventricular size is normal. No increase in right ventricular wall thickness. Right ventricular systolic function is normal. Left Atrium: Left atrial size was severely dilated. Right Atrium: Right atrial size was mildly dilated. Pericardium: There is no evidence of pericardial effusion. Mitral Valve: The mitral valve is normal in structure. Mild mitral valve regurgitation. No evidence of mitral valve stenosis.  Tricuspid Valve: The tricuspid valve is normal in  structure. Tricuspid valve regurgitation is not demonstrated. No evidence of tricuspid stenosis. Aortic Valve: The aortic valve is normal in structure. Aortic valve regurgitation is trivial. No aortic stenosis is present. Aortic valve mean gradient measures 3.0 mmHg. Aortic valve peak gradient measures 5.2 mmHg. Aortic valve area, by VTI measures 1.84 cm. Pulmonic Valve: The pulmonic valve was normal in structure. Pulmonic valve regurgitation is not visualized. No evidence of pulmonic stenosis. Aorta: The aortic root is normal in size and structure. Venous: The inferior vena cava is normal in size with less than 50% respiratory variability, suggesting right atrial pressure of 8 mmHg. IAS/Shunts: No atrial level shunt detected by color flow Doppler. Additional Comments: 3D was performed not requiring image post processing on an independent workstation and was abnormal.  LEFT VENTRICLE PLAX 2D LVIDd:         6.30 cm         Diastology LVIDs:         6.10 cm         LV e' medial:    3.81 cm/s LV PW:         0.90 cm         LV E/e' medial:  13.3 LV IVS:        0.90 cm         LV e' lateral:   7.07 cm/s LVOT diam:     2.10 cm         LV E/e' lateral: 7.2 LV SV:         31 LV SV Index:   18              2D Longitudinal LVOT Area:     3.46 cm        Strain                                2D Strain GLS   -3.9 %                                Avg: LV Volumes (MOD) LV vol d, MOD    244.0 ml      3D Volume EF A2C:                           LV 3D EF:    Left LV vol d, MOD    252.0 ml                   ventricul A4C:                                        ar LV vol s, MOD    193.0 ml                   ejection A2C:                                        fraction LV vol s, MOD    217.0 ml  by 3D A4C:                                        volume is LV SV MOD A2C:   51.0 ml                    26 %. LV SV MOD A4C:   252.0 ml LV SV MOD BP:    48.3 ml                                3D Volume EF:                                 3D EF:        26 %                                LV EDV:       261 ml                                LV ESV:       194 ml                                LV SV:        67 ml RIGHT VENTRICLE            IVC RV Basal diam:  3.50 cm    IVC diam: 1.90 cm RV S prime:     7.72 cm/s TAPSE (M-mode): 1.5 cm LEFT ATRIUM              Index        RIGHT ATRIUM           Index LA diam:        5.40 cm  3.13 cm/m   RA Area:     21.40 cm LA Vol (A2C):   119.0 ml 69.03 ml/m  RA Volume:   64.80 ml  37.59 ml/m LA Vol (A4C):   92.8 ml  53.83 ml/m LA Biplane Vol: 108.0 ml 62.65 ml/m  AORTIC VALVE AV Area (Vmax):    2.11 cm AV Area (Vmean):   1.88 cm AV Area (VTI):     1.84 cm AV Vmax:           114.00 cm/s AV Vmean:          77.400 cm/s AV VTI:            0.167 m AV Peak Grad:      5.2 mmHg AV Mean Grad:      3.0 mmHg LVOT Vmax:         69.40 cm/s LVOT Vmean:        42.000 cm/s LVOT VTI:          0.089 m LVOT/AV VTI ratio: 0.53  AORTA Ao Root diam: 3.50 cm MITRAL VALVE MV Area (PHT): 3.99 cm    SHUNTS MV Decel Time: 190 msec    Systemic VTI:  0.09 m MR Peak grad: 69.4 mmHg    Systemic Diam: 2.10 cm MR  Mean grad: 39.0 mmHg MR Vmax:      416.50 cm/s MR Vmean:     287.0 cm/s MV E velocity: 50.60 cm/s MV A velocity: 73.30 cm/s MV E/A ratio:  0.69 Donato Schultz MD Electronically signed by Donato Schultz MD Signature Date/Time: 01/04/2024/3:16:04 PM    Final     Cardiac Studies    Echo   January 04, 2024   1. Left ventricular ejection fraction, by estimation, is <20%. Left  ventricular ejection fraction by 3D volume is 26 %. The left ventricle has  severely decreased function. The left ventricle has no regional wall  motion abnormalities. The left  ventricular internal cavity size was moderately dilated. Left ventricular  diastolic parameters are consistent with Grade III diastolic dysfunction  (restrictive). The average left ventricular global longitudinal strain is  -3.9 %. The global longitudinal  strain is  abnormal.   2. Right ventricular systolic function is normal. The right ventricular  size is normal.   3. Left atrial size was severely dilated.   4. Right atrial size was mildly dilated.   5. The mitral valve is normal in structure. Mild mitral valve  regurgitation. No evidence of mitral stenosis.   6. The aortic valve is normal in structure. Aortic valve regurgitation is  trivial. No aortic stenosis is present.   7. The inferior vena cava is normal in size with <50% respiratory  variability, suggesting right atrial pressure of 8 mmHg.   Comparison(s): Prior images reviewed side by side.  Echocardiogram 09/14/2021 1. Left ventricular ejection fraction, by estimation, is 25%. The left  ventricle has normal function. The left ventricle demonstrates global  hypokinesis. The left ventricular internal cavity size was moderately  dilated. Left ventricular diastolic  parameters are consistent with Grade I diastolic dysfunction (impaired  relaxation).   2. Right ventricular systolic function is normal. The right ventricular  size is normal.   3. Left atrial size was moderately dilated.   4. Right atrial size was mildly dilated.   5. The mitral valve is normal in structure. Mild mitral valve  regurgitation. No evidence of mitral stenosis.   6. The aortic valve is tricuspid. Aortic valve regurgitation is mild. No  aortic stenosis is present.   7. Aortic dilatation noted. There is borderline dilatation of the  ascending aorta, measuring 38 mm.   8. The inferior vena cava is normal in size with greater than 50%  respiratory variability, suggesting right atrial pressure of 3 mmHg.    Cardiac MRI 04/10/2021 1.  Mild LV dilatation with severe systolic dysfunction (EF 25%)   2.  Normal RV size and systolic function (EF 49%)   3. Basal septal midwall late gadolinium enhancement, which is a nonspecific scar pattern seen in nonischemic cardiomyopathies and associated with a worse prognosis   4.   Mild mitral regurgitation (regurgitant fraction 18%)   Right left heart catheterization 04/09/2021 Ao =98/68 (82) LV = 81/3 RA = 1 RV = 27/2 PA = 25/8 (16) PCW = 10 Fick cardiac output/index = 5.8/3.2 PVR = 1.0 WU Ao sat = 100% PA sat = 66%, 70%   Assessment: 1. Normal coronary arteries 2. 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 Low filling pressures with normal cardiac output    Patient Profile      ANDRIEL OMALLEY is a 63 y.o. male with a hx of nonischemic cardiomyopathy with reduced EF, Waldenstrm's macroglobulinemia, IgM monoclonal gammopathy, HTN who is being seen 01/04/2024 for the evaluation of CHF exacerbation at the  request of Dr. Gwendolyn Grant.   Assessment & Plan    1  NICM   Pt presented yesterday with worsening SOB , edema    He says he was told to not take a fluid pill   I cannot see this order Echo done about noon yesterday shows LVEF severely depressed   Probably 20 to 25% I do think it is a little worse than echo from 2022   Note is was more volume overloaded when it was done so this could effect findings  Pt has diuresed and is feeling back to baseline   I would switch to torsemide 20 mg   With 20 KCL BP is marginal   I would not add further GDMT  for now   Follow up in clinc to assess BP and renal function     2  Chest pain   No symptoms ot sugg ischemia  3   Waldenstrom's macroglobulinemia    Followed by oncology  On Ibrutinib    Will sign off  Please call with questions  For questions or updates, please contact [facility] Please consult [email or web address] for contact info under        Signed, Dietrich Pates, MD  01/06/2024, 8:25 AM

## 2024-01-06 NOTE — Plan of Care (Signed)

## 2024-01-06 NOTE — Progress Notes (Addendum)
 Progress Note   Patient: Connor Solomon HYQ:[id number] DOB: 03-Oct-1961 DOA: 01/04/2024     1 DOS: the patient was seen and examined on 01/06/2024   Brief hospital course: Connor Solomon is a 63 y.o. male with history of Waldenstrm's macroglobulinemia, nonischemic cardiomyopathy last EF measured was 25% with grade 1 diastolic dysfunction on 12/22, hypertension, anxiety, chronic pain presents to the ER because of worsening shortness of breath.  Patient is being treated for Waldenstrm's macroglobulinemia by Dr. Leonides Schanz oncologist and is on ibrutinib/rituximab.  4 to 5 days prior to admission reported increasing exertional dyspnea and increasing lower extremity edema.  Patient states he used to be on Lasix which was discontinued few months ago.  Has some chest tightness and also had recent upper respiratory infection and a month ago also had some palpitations.  Came to ER for same symptoms and was admitted for presumed CHF exacerbation.  Assessment and Plan: Acute on chronic HFrEF  -Working etiology is stopping his diuretics outpatient.  Reports he was told by his outpatient physician to stop his Lasix. -Continues to feel better each day.   -Much improved today.  LE edema has essentially resolved.  No further JVD -Continues to diurese well.  Appreciate cardiology input.  Switching to torsemide today -On beta-blocker.  Blood pressure systolic around 100.  Not lightheaded when he gets up to walk around the room although blood pressure remains low. -Will await for further GDMT on outpatient basis - Plan will be switch to torsemide, monitor overnight for further diuresis, then likely able to discharge home tomorrow 3/29  Recurrent tachycardia: - appreciate Dr. Erin Hearing review of rhythm strips yesterday evening.  - bursts of tachycardia deemed SVT - Hypokalemic to 3.0 yesterday.  Replaced potassium x 2 - Goal will be K+ > 4 in this cardiac patient with ongoing muscle cramps, diuresis, and  SVT  Shortness of breath: Greatly improved.  On room air.  Acute renal failure  -Resolved. -Will follow creatinine in light of ongoing diuresis.  Elevated troponin -No further issues with chest pain. -Likely strain from CHF.  No acute ischemic changes on EKG. -Of note his troponins do appear to be chronically elevated.  Waldenstrm's macroglobulinemia  - being followed by oncologist.  On ibrutinib and rituximab.  Hypokalemia: -Will schedule potassium BID while in house and while being monitored in light of ongoing diuretics.  Chronic pain  - on methadone and oxycodone.  Medications confirmed on PDMP website.  Mildly elevated LFTs  -Secondary to CHF exacerbation - recheck in AM  Metabolic acidosis: -Resolved with ongoing diuresis.     Subjective: Dyspnea much better.  Able to walk around the room now without shortness of breath.  Leg swelling also improved  Physical Exam: Vitals:   01/06/24 0451 01/06/24 0452 01/06/24 1311 01/06/24 1318  BP: 105/82  116/86 116/86  Pulse: 91  95 95  Resp: 20  16   Temp: 98.3 F (36.8 C)  97.8 F (36.6 C)   TempSrc: Oral  Oral   SpO2: 100%  97%   Weight:  56.4 kg    Height:       Eyes: Anicteric no pallor. ENMT: No discharge from the ears eyes nose or mouth. Neck: JVD normal Lungs: Lungs essentially clear Cardiovascular: S1-S2 heard. Abdomen: Soft nontender bowel sound present. Extremities: Trace edema bilateral extremities. Skin: No rash. Neurologic: Alert awake oriented to time place and person.  Moves all extremities. Psychiatric: Appears normal.  Normal affect.   Disposition: Status is: Inpatient  Dispo: Continue IV diuresis  Planned Discharge Destination: Home    Time spent: 35 minutes  Author: Tobey Grim, MD 01/06/2024 2:16 PM  For on call review [email or web address].

## 2024-01-07 ENCOUNTER — Inpatient Hospital Stay (HOSPITAL_COMMUNITY): Payer: Medicare (Managed Care)

## 2024-01-07 DIAGNOSIS — I5043 Acute on chronic combined systolic (congestive) and diastolic (congestive) heart failure: Secondary | ICD-10-CM

## 2024-01-07 DIAGNOSIS — R079 Chest pain, unspecified: Secondary | ICD-10-CM

## 2024-01-07 LAB — BASIC METABOLIC PANEL WITH GFR
Anion gap: 10 (ref 5–15)
BUN: 19 mg/dL (ref 8–23)
CO2: 25 mmol/L (ref 22–32)
Calcium: 8.7 mg/dL — ABNORMAL LOW (ref 8.9–10.3)
Chloride: 101 mmol/L (ref 98–111)
Creatinine, Ser: 0.81 mg/dL (ref 0.61–1.24)
GFR, Estimated: >60 mL/min (ref >60)
Glucose, Bld: 85 mg/dL (ref 70–99)
Potassium: 4.8 mmol/L (ref 3.5–5.1)
Sodium: 136 mmol/L (ref 135–145)

## 2024-01-07 LAB — BLOOD GAS, ARTERIAL
Acid-Base Excess: 5.5 mmol/L — ABNORMAL HIGH (ref 0.0–2.0)
Bicarbonate: 29.9 mmol/L — ABNORMAL HIGH (ref 20.0–28.0)
Drawn by: 20012
FIO2: 21 %
O2 Saturation: 97.2 %
Patient temperature: 36.7
pCO2 arterial: 41 mmHg (ref 32–48)
pH, Arterial: 7.47 — ABNORMAL HIGH (ref 7.35–7.45)
pO2, Arterial: 71 mmHg — ABNORMAL LOW (ref 83–108)

## 2024-01-07 LAB — SEDIMENTATION RATE: Sed Rate: 18 mm/h — ABNORMAL HIGH (ref 0–16)

## 2024-01-07 LAB — TROPONIN I (HIGH SENSITIVITY)
Troponin I (High Sensitivity): 25 ng/L — ABNORMAL HIGH (ref <18)
Troponin I (High Sensitivity): 26 ng/L — ABNORMAL HIGH (ref <18)
Troponin I (High Sensitivity): 26 ng/L — ABNORMAL HIGH (ref <18)

## 2024-01-07 LAB — C-REACTIVE PROTEIN: CRP: 7.7 mg/dL — ABNORMAL HIGH (ref <1.0)

## 2024-01-07 LAB — MAGNESIUM: Magnesium: 2.3 mg/dL (ref 1.7–2.4)

## 2024-01-07 MED ORDER — ASPIRIN 325 MG PO TABS
325.0000 mg | ORAL_TABLET | Freq: Every day | ORAL | Status: DC
  Administered 2024-01-07 – 2024-01-10 (×4): 325 mg via ORAL
  Filled 2024-01-07 (×4): qty 1

## 2024-01-07 MED ORDER — FUROSEMIDE 10 MG/ML IJ SOLN
80.0000 mg | Freq: Once | INTRAMUSCULAR | Status: DC
  Filled 2024-01-07: qty 8

## 2024-01-07 MED ORDER — NITROGLYCERIN 0.4 MG SL SUBL
0.4000 mg | SUBLINGUAL_TABLET | Freq: Once | SUBLINGUAL | Status: AC
  Administered 2024-01-07: 0.4 mg via SUBLINGUAL
  Filled 2024-01-07: qty 1

## 2024-01-07 NOTE — Plan of Care (Signed)

## 2024-01-07 NOTE — Progress Notes (Signed)
 Pt BP 80's/60's systolic, holding lasix and methadone at this time per MD Walden/cross checked with cardiology MD Jimmey Ralph. Patient having lethary and decreased LOC, falling asleep mid conversation and dropping his drinks in the bed. Placed on 2L [place] r/t oxygen 93-94 on room air. NIH completed and had score of 1 for dec LOC otherwise negative. Will continue to monitor.

## 2024-01-07 NOTE — Progress Notes (Signed)
 Progress Note   Patient: Connor Solomon ZHY:[id number] DOB: [birth year] DOA: 01/04/2024     2 DOS: the patient was seen and examined on 01/07/2024   Brief hospital course: Connor Solomon is a 63 y.o. male with history of Waldenstrm's macroglobulinemia, nonischemic cardiomyopathy last EF measured was 25% with grade 1 diastolic dysfunction on 12/22, hypertension, anxiety, chronic pain presents to the ER because of worsening shortness of breath.  Patient is being treated for Waldenstrm's macroglobulinemia by Dr. Leonides Schanz oncologist and is on ibrutinib/rituximab.  4 to 5 days prior to admission reported increasing exertional dyspnea and increasing lower extremity edema.  Patient states he used to be on Lasix which was discontinued few months ago.  Has some chest tightness and also had recent upper respiratory infection and a month ago also had some palpitations.  Came to ER for same symptoms and was admitted for presumed CHF exacerbation.  Assessment and Plan: Acute on chronic HFrEF  -Working etiology is stopping his diuretics outpatient.  Reports he was told by his outpatient physician to stop his Lasix. - Acute onset CP this AM on my exam.  Had just gone to the bathroom after walking 2 laps around the nurse's station.  Status post ASA 325 and nitroglycerin x 1.  Rest relieves pain.  No further episodes since that time this morning. - Still without LE edema or JVF -EKG showed inverted T waves deeper than previous.  Troponin obtained and decreased from prior -Cardiology consulted.  Appreciate input.  Evidently with normal coronary arteries cast 2022 and nonspecific scar pattern on MRI. -Also possibility of ibrutinib related pericarditis.  ESR/CRP pending. -Diuresis held today secondary to hypotension.  Recurrent tachycardia: - appreciate Dr. Erin Hearing review of rhythm strips 3/27.  - bursts of tachycardia deemed SVT - Goal will be K+ > 4 in this cardiac patient with ongoing muscle cramps, diuresis, and  SVT  Shortness of breath: Greatly improved.  On room air.  Acute renal failure  -Resolved. -Will follow creatinine in light of ongoing diuresis.  Elevated troponin -No further issues with chest pain. -Likely strain from CHF.  No acute ischemic changes on EKG. -Of note his troponins do appear to be chronically elevated.  Waldenstrm's macroglobulinemia  - being followed by oncologist.  On ibrutinib and rituximab.  Hypokalemia: -Will schedule potassium BID while in house and while being monitored in light of ongoing diuretics.  Chronic pain  - on methadone and oxycodone.  Medications confirmed on PDMP website.  Mildly elevated LFTs  -Secondary to CHF exacerbation - recheck in AM  Metabolic acidosis: -Resolved with ongoing diuresis.     Subjective: Dyspnea much better.  Able to walk around the room now without shortness of breath.  Leg swelling also improved  Physical Exam: Vitals:   01/07/24 0500 01/07/24 0504 01/07/24 0851 01/07/24 1004  BP:  113/79 113/67 115/80  Pulse:  86 89 94  Resp:  20 16 (!) 21  Temp:  98.2 F (36.8 C)  97.7 F (36.5 C)  TempSrc:  Oral  Oral  SpO2:  99% 98% 99%  Weight: 57 kg     Height:       Eyes: Anicteric no pallor. ENMT: No discharge from the ears eyes nose or mouth. Neck: JVD normal Lungs: Lungs essentially clear Cardiac:  Regular rate and rhythm Abdomen: Soft nontender bowel sound present. Extremities: Trace edema bilateral extremities. Skin: No rash. Neurologic: Alert awake oriented to time place and person.  Moves all extremities. Psychiatric: Appears normal.  Normal affect.   Disposition: Status is: Inpatient Dispo: Continue IV diuresis  Planned Discharge Destination: Home    Time spent: 35 minutes  Author: Tobey Grim, MD 01/07/2024 10:18 AM  For on call review [email or web address].

## 2024-01-07 NOTE — Progress Notes (Signed)
   Patient Name: Connor Solomon Date of Encounter: 01/07/2024 [facility] Cardiologist: Arvilla Meres, MD   Interval Summary  .    Episode of his typical chest pain today, after walking in the halls and then ambulating in the bathroom. EKG at that time shows SR, PAC, lateral precordial T wave inversions, Trop 25 reduced from initial presentation of 50. Pt diuresed well over this admission, last dose of lasix 01/05/24, none yesterday, torsemide recommended but not yet started.   Repeat EKG with slightly improved T wave inversions but persistent. SR and PACs.  Vital Signs .    Vitals:   01/07/24 0851 01/07/24 1004 01/07/24 1046 01/07/24 1200  BP: 113/67 115/80 109/75 123/81  Pulse: 89 94 92 88  Resp: 16 (!) 21 15 16   Temp:  97.7 F (36.5 C)    TempSrc:  Oral    SpO2: 98% 99% 94%   Weight:      Height:        Intake/Output Summary (Last 24 hours) at 01/07/2024 1209 Last data filed at 01/07/2024 0800 Gross per 24 hour  Intake 1320 ml  Output 700 ml  Net 620 ml      01/07/2024    5:00 AM 01/06/2024    4:52 AM 01/05/2024    7:10 AM  Last 3 Weights  Weight (lbs) 125 lb 10.6 oz 124 lb 5.4 oz 126 lb 8.7 oz  Weight (kg) 57 kg 56.4 kg 57.4 kg      Telemetry/ECG    SR PACs - Personally Reviewed  Physical Exam .   GEN: No acute distress.   Neck: No JVD Cardiac: RRR, no murmurs, rubs, or gallops.  Respiratory: Clear to auscultation bilaterally. GI: Soft, nontender, non-distended  MS: No edema  Assessment & Plan .     #chest pain - I was called to reevaluate patient's chest pain with EKG changes, worsening T wave changes lateral V4-V6. Inverted and not resolved on repeat EKG.  Trop at time of chest pain only 25. Hx of normal coronary arteries on cath 2022 and nonspecific scar pattern on MRI.  - possibly related to continued volume, will give one additional dose of lasix today. Warm on exam, edema improved. UOP about 600 cc today per nuse.  - repeat trops x 2 -  check esr crp - consider ibrutinib related pericarditis. Does look like a trivial to small pericardial effusion anterior/apex on echo.  - will follow. Does not clinically look like any tamponade physiology.    PER IM: Active Problems:   essential hypertension   Waldenstrom macroglobulinemia   ARF (acute renal failure) (HCC)   Elevated LFTs    For questions or updates, please contact [facility] Please consult [email or web address] for contact info under        Signed, Parke Poisson, MD

## 2024-01-08 ENCOUNTER — Other Ambulatory Visit: Payer: Self-pay

## 2024-01-08 DIAGNOSIS — I428 Other cardiomyopathies: Secondary | ICD-10-CM | POA: Diagnosis not present

## 2024-01-08 DIAGNOSIS — R0781 Pleurodynia: Secondary | ICD-10-CM | POA: Diagnosis not present

## 2024-01-08 DIAGNOSIS — I5021 Acute systolic (congestive) heart failure: Secondary | ICD-10-CM | POA: Diagnosis not present

## 2024-01-08 DIAGNOSIS — I5043 Acute on chronic combined systolic (congestive) and diastolic (congestive) heart failure: Secondary | ICD-10-CM | POA: Diagnosis not present

## 2024-01-08 LAB — URINALYSIS, W/ REFLEX TO CULTURE (INFECTION SUSPECTED)
Bacteria, UA: NONE SEEN
Bilirubin Urine: NEGATIVE
Glucose, UA: NEGATIVE mg/dL
Hgb urine dipstick: NEGATIVE
Ketones, ur: NEGATIVE mg/dL
Leukocytes,Ua: NEGATIVE
Nitrite: NEGATIVE
Protein, ur: NEGATIVE mg/dL
Specific Gravity, Urine: 1.021 (ref 1.005–1.030)
pH: 6 (ref 5.0–8.0)

## 2024-01-08 LAB — CBC
HCT: 41.1 % (ref 39.0–52.0)
Hemoglobin: 12.5 g/dL — ABNORMAL LOW (ref 13.0–17.0)
MCH: 26 pg (ref 26.0–34.0)
MCHC: 30.4 g/dL (ref 30.0–36.0)
MCV: 85.6 fL (ref 80.0–100.0)
Platelets: 270 10*3/uL (ref 150–400)
RBC: 4.8 MIL/uL (ref 4.22–5.81)
RDW: 16.9 % — ABNORMAL HIGH (ref 11.5–15.5)
WBC: 18.7 10*3/uL — ABNORMAL HIGH (ref 4.0–10.5)
nRBC: 0 % (ref 0.0–0.2)

## 2024-01-08 LAB — COMPREHENSIVE METABOLIC PANEL WITH GFR
ALT: 19 U/L (ref 0–44)
AST: 11 U/L — ABNORMAL LOW (ref 15–41)
Albumin: 2.8 g/dL — ABNORMAL LOW (ref 3.5–5.0)
Alkaline Phosphatase: 81 U/L (ref 38–126)
Anion gap: 8 (ref 5–15)
BUN: 19 mg/dL (ref 8–23)
CO2: 24 mmol/L (ref 22–32)
Calcium: 8.8 mg/dL — ABNORMAL LOW (ref 8.9–10.3)
Chloride: 101 mmol/L (ref 98–111)
Creatinine, Ser: 0.83 mg/dL (ref 0.61–1.24)
GFR, Estimated: >60 mL/min (ref >60)
Glucose, Bld: 85 mg/dL (ref 70–99)
Potassium: 5.3 mmol/L — ABNORMAL HIGH (ref 3.5–5.1)
Sodium: 133 mmol/L — ABNORMAL LOW (ref 135–145)
Total Bilirubin: 1.2 mg/dL (ref 0.0–1.2)
Total Protein: 6 g/dL — ABNORMAL LOW (ref 6.5–8.1)

## 2024-01-08 MED ORDER — COLCHICINE 0.6 MG PO TABS
0.6000 mg | ORAL_TABLET | Freq: Two times a day (BID) | ORAL | Status: DC
  Administered 2024-01-08 – 2024-01-10 (×5): 0.6 mg via ORAL
  Filled 2024-01-08 (×5): qty 1

## 2024-01-08 MED ORDER — FUROSEMIDE 10 MG/ML IJ SOLN: 40.0000 mg | Freq: Once | INTRAMUSCULAR | Status: DC

## 2024-01-08 MED ORDER — ENSURE ENLIVE PO LIQD
237.0000 mL | Freq: Two times a day (BID) | ORAL | Status: DC
  Administered 2024-01-09 (×2): 237 mL via ORAL

## 2024-01-08 NOTE — Progress Notes (Signed)
   Patient Name: Connor Solomon Date of Encounter: 01/08/2024 [facility] Cardiologist: Arvilla Meres, MD   Interval Summary  .    Continues to have pleuritic chest pain. Trops minimally elevated but less than presentation. ESR CRP elevated, concern for myopericarditis.   Vital Signs .    Vitals:   01/07/24 2050 01/08/24 0430 01/08/24 0500 01/08/24 0850  BP: 119/71 113/77  111/76  Pulse: 87 91    Resp: 12 16  15   Temp: 98.5 F (36.9 C) 98.6 F (37 C)    TempSrc: Oral Oral    SpO2: 100% 98%  95%  Weight:   57.7 kg   Height:        Intake/Output Summary (Last 24 hours) at 01/08/2024 0947 Last data filed at 01/08/2024 0800 Gross per 24 hour  Intake 237 ml  Output 400 ml  Net -163 ml      01/08/2024    5:00 AM 01/07/2024    5:00 AM 01/06/2024    4:52 AM  Last 3 Weights  Weight (lbs) 127 lb 3.3 oz 125 lb 10.6 oz 124 lb 5.4 oz  Weight (kg) 57.7 kg 57 kg 56.4 kg      Telemetry/ECG    SR PACs , brief episode of SVT- Personally Reviewed  Physical Exam .   GEN: No acute distress.   Neck: No JVD Cardiac: RRR, no murmurs, possible faint rub apically Respiratory: Clear to auscultation bilaterally. GI: Soft, nontender, non-distended  MS: No edema  Assessment & Plan .     #chest pain - continues to have pleuritic chest pain.  - did not receive lasix yesterday due to hypotension. - normotensive today, mentating clearly. Warm on exam. Ok to hold lasix today.  - need to eval for ibrutinib related pericarditis given pleuritic chest pain. Pt agreeable to cardiac MRI to eval for myopericarditis related to TKI. Also note from chart that he had a "head cold", cannot exclude viral pericarditis as well.  - will start colchicine 0.6 mg BID today for chest pain, monitor for response. Counseled on GI upset, will reduce dose if that occurs.   #NICM - not requiring diuretic at home but had volume overload on presentation, diuresed and has been held since 3/27.  -03/2021  R/LHC no CAD, low filling pressure/preserved cardiac output -04/2021 cardiac MRI EF 25%, normal RV, LGE with nonischemic scar pattern -09/2021 echo 25%, normal RV.  Mild MR PTA GDMT: Carvedilol 3.125 mg twice daily, Farxiga 10 mg, eplerenone 12.5 mg (had gynecomastia with spiro), Entresto 24-26 mg. Inpatient GDMT: Continue with carvedilol 3.125 mg twice daily (okay for now) At DC needs to follow-up with advanced heart failure  PER IM: Active Problems:   essential hypertension   Waldenstrom macroglobulinemia   ARF (acute renal failure) (HCC)   Elevated LFTs    For questions or updates, please contact [facility] Please consult [email or web address] for contact info under        Signed, Parke Poisson, MD

## 2024-01-08 NOTE — Progress Notes (Signed)
 Progress Note   Patient: Connor Solomon:[id number] DOB: 25-Jul-1961 DOA: 01/04/2024     3 DOS: the patient was seen and examined on 01/08/2024   Brief hospital course: ESTEBAN KOBASHIGAWA is a 63 y.o. male with history of Waldenstrm's macroglobulinemia, nonischemic cardiomyopathy last EF measured was 25% with grade 1 diastolic dysfunction on 12/22, hypertension, anxiety, chronic pain presents to the ER because of worsening shortness of breath.  Patient is being treated for Waldenstrm's macroglobulinemia by Dr. Leonides Schanz oncologist and is on ibrutinib/rituximab.  4 to 5 days prior to admission reported increasing exertional dyspnea and increasing lower extremity edema.  Patient states he used to be on Lasix which was discontinued few months ago.  Has some chest tightness and also had recent upper respiratory infection and a month ago also had some palpitations.  Came to ER for same symptoms and was admitted for presumed CHF exacerbation.  Assessment and Plan: Chest pain -New issue that started yesterday, pleuritic chest pain.  Cardiology reconsulted. -Continued minimal elevation of troponins and ESR/CRP elevated. -Concern now is for myopericarditis -Status post colchicine.  He is tolerating this well. -Does evidently report head cold recently. -Plan will be for cardiac [facility] at [facility] tomorrow to check for ibrutinib related pericarditis/viral myopericarditis.  Acute on chronic HFrEF  -Working etiology is stopping his diuretics outpatient.  Reports he was told by his outpatient physician to stop his Lasix. -He has been doing well from the standpoint.   -Appears euvolemic. -No JVD. -Diuretics held yesterday secondary to hypotension.  Lungs sound good.  Will continue to hold diuretic and recheck tomorrow  Recurrent tachycardia: - appreciate Dr. Erin Hearing review of rhythm strips 3/27.  - bursts of tachycardia deemed SVT - Goal will be K+ > 4 in this cardiac patient with ongoing muscle  cramps, diuresis, and SVT  Shortness of breath: Greatly improved.  On room air, though did drop to about 90% O2 sats with pleuritic chest pain yesterday.  Acute renal failure  -Resolved. -Will follow creatinine in light of ongoing diuresis.  Waldenstrm's macroglobulinemia  - being followed by oncologist.  On ibrutinib and rituximab.  Hypokalemia: -Now actually hyperkalemic today. -Diuretics had been held since 3/27 and he was on scheduled potassium. -Potassium discontinued. -Follow and recheck BMP  Chronic pain  - on methadone and oxycodone.  Medications confirmed on PDMP website.  Mildly elevated LFTs  -Secondary to CHF exacerbation -Resolved on recheck  Metabolic acidosis: -Resolved with ongoing diuresis.     Subjective: Still with some pleuritic chest pain today.  Denies any coughing.  Eating and drinking well.  No nausea or vomiting.  No abdominal pain.  Physical Exam: Vitals:   01/08/24 0850 01/08/24 1101 01/08/24 1308 01/08/24 1500  BP: 111/76 106/73 110/71 113/63  Pulse:  89 80   Resp: 15  20   Temp:   97.6 F (36.4 C)   TempSrc:   Oral   SpO2: 95%  98%   Weight:      Height:       Eyes: Anicteric no pallor. ENMT: No discharge from the ears eyes nose or mouth. Neck: JVD normal Lungs: Lungs clear without adventitious breath sounds Cardiac:  Regular rate and rhythm.  Chest nontender to palpation Abdomen: Soft nontender bowel sound present. Extremities: Trace edema bilateral extremities. Skin: No rash. Neurologic: Alert awake oriented to time place and person.  Moves all extremities. Psychiatric: Appears normal.  Normal affect.   Disposition: Status is: Inpatient Dispo: Continue IV diuresis  Planned Discharge Destination: Home    Time spent: 35 minutes  Author: Tobey Grim, MD 01/08/2024 3:56 PM  For on call review [email or web address].

## 2024-01-08 NOTE — Progress Notes (Signed)
 Report received from an RN. Agreed with nurse assessment of pt and will cont to monitor.

## 2024-01-09 ENCOUNTER — Inpatient Hospital Stay (HOSPITAL_COMMUNITY): Payer: Medicare (Managed Care)

## 2024-01-09 ENCOUNTER — Inpatient Hospital Stay (HOSPITAL_COMMUNITY): Admit: 2024-01-09 | Payer: Medicare (Managed Care)

## 2024-01-09 ENCOUNTER — Other Ambulatory Visit (HOSPITAL_COMMUNITY): Payer: Medicare (Managed Care)

## 2024-01-09 DIAGNOSIS — I3139 Other pericardial effusion (noninflammatory): Secondary | ICD-10-CM

## 2024-01-09 DIAGNOSIS — I5021 Acute systolic (congestive) heart failure: Secondary | ICD-10-CM | POA: Diagnosis not present

## 2024-01-09 DIAGNOSIS — I5043 Acute on chronic combined systolic (congestive) and diastolic (congestive) heart failure: Secondary | ICD-10-CM | POA: Diagnosis not present

## 2024-01-09 LAB — CBC
HCT: 39.8 % (ref 39.0–52.0)
Hemoglobin: 12.3 g/dL — ABNORMAL LOW (ref 13.0–17.0)
MCH: 26.6 pg (ref 26.0–34.0)
MCHC: 30.9 g/dL (ref 30.0–36.0)
MCV: 86 fL (ref 80.0–100.0)
Platelets: 250 10*3/uL (ref 150–400)
RBC: 4.63 MIL/uL (ref 4.22–5.81)
RDW: 17.2 % — ABNORMAL HIGH (ref 11.5–15.5)
WBC: 17.2 10*3/uL — ABNORMAL HIGH (ref 4.0–10.5)
nRBC: 0 % (ref 0.0–0.2)

## 2024-01-09 LAB — ECHOCARDIOGRAM LIMITED
Est EF: <20
Height: 68 in
Weight: 1932.99 [oz_av]

## 2024-01-09 LAB — BASIC METABOLIC PANEL WITH GFR
Anion gap: 9 (ref 5–15)
BUN: 17 mg/dL (ref 8–23)
CO2: 23 mmol/L (ref 22–32)
Calcium: 8.7 mg/dL — ABNORMAL LOW (ref 8.9–10.3)
Chloride: 100 mmol/L (ref 98–111)
Creatinine, Ser: 0.77 mg/dL (ref 0.61–1.24)
GFR, Estimated: >60 mL/min (ref >60)
Glucose, Bld: 97 mg/dL (ref 70–99)
Potassium: 4.5 mmol/L (ref 3.5–5.1)
Sodium: 132 mmol/L — ABNORMAL LOW (ref 135–145)

## 2024-01-09 MED ORDER — MUPIROCIN 2 % EX OINT
1.0000 | TOPICAL_OINTMENT | Freq: Two times a day (BID) | CUTANEOUS | Status: DC
  Administered 2024-01-09 – 2024-01-10 (×2): 1 via NASAL
  Filled 2024-01-09: qty 22

## 2024-01-09 MED ORDER — FUROSEMIDE 40 MG PO TABS
40.0000 mg | ORAL_TABLET | Freq: Every day | ORAL | Status: DC
  Administered 2024-01-09 – 2024-01-10 (×2): 40 mg via ORAL
  Filled 2024-01-09 (×2): qty 1

## 2024-01-09 MED ORDER — CHLORHEXIDINE GLUCONATE CLOTH 2 % EX PADS
6.0000 | MEDICATED_PAD | Freq: Every day | CUTANEOUS | Status: DC
  Administered 2024-01-09: 6 via TOPICAL

## 2024-01-09 NOTE — Progress Notes (Signed)
   01/09/24 1645  Provider Notification  Provider Name/Title Dr. Gwendolyn Grant  Date Provider Notified 01/09/24  Time Provider Notified 1647  Method of Notification Page (secure chat)  Notification Reason Other (Comment) (large bruise post lovenox injection)

## 2024-01-09 NOTE — Progress Notes (Addendum)
   Patient Name: Connor Solomon Date of Encounter: 01/09/2024 [facility] Cardiologist: Arvilla Meres, MD   Interval Summary  .    Seen at [facility] in prep area before CMR. Pleuritic chest pain improving on colchicine but not resolved.  Vital Signs .    Vitals:   01/08/24 2049 01/09/24 0545 01/09/24 0911 01/09/24 0916  BP: 110/67 117/75  103/75  Pulse: 90 (!) 102  92  Resp: 18 20    Temp: 98.3 F (36.8 C) 98.2 F (36.8 C)    TempSrc: Oral Oral    SpO2: 99% 96%    Weight:   54.8 kg   Height:        Intake/Output Summary (Last 24 hours) at 01/09/2024 1046 Last data filed at 01/09/2024 0500 Gross per 24 hour  Intake 480 ml  Output 1325 ml  Net -845 ml      01/09/2024    9:11 AM 01/08/2024    5:00 AM 01/07/2024    5:00 AM  Last 3 Weights  Weight (lbs) 120 lb 13 oz 127 lb 3.3 oz 125 lb 10.6 oz  Weight (kg) 54.8 kg 57.7 kg 57 kg      Telemetry/ECG    SR PACs  Personally Reviewed  Physical Exam .   GEN: No acute distress.   Neck: No JVD Cardiac: RRR, no murmurs, possible faint rub apically Respiratory: Clear to auscultation bilaterally. GI: Soft, nontender, non-distended  MS: No edema  Assessment & Plan .     #chest pain - continues to have pleuritic chest pain.  - did not receive lasix due to hypotension, appears euvolemic today.  - normotensive today, mentating clearly. Warm on exam. Ok to hold lasix today.  - need to eval for ibrutinib related pericarditis given pleuritic chest pain. Pt agreeable to cardiac MRI to eval for myopericarditis related to TKI. Also note from chart that he had a "head cold", cannot exclude viral pericarditis as well.  -colchicine 0.6 mg BID for chest pain, monitor for response. Counseled on GI upset, will reduce dose if that occurs. No gi upset so far. - MRI pending today.   #NICM - not requiring diuretic at home but had volume overload on presentation, diuresed and has been held since 3/27.  -03/2021 R/LHC no CAD, low  filling pressure/preserved cardiac output -04/2021 cardiac MRI EF 25%, normal RV, LGE with nonischemic scar pattern -09/2021 echo 25%, normal RV.  Mild MR PTA GDMT: Carvedilol 3.125 mg twice daily, Farxiga 10 mg, eplerenone 12.5 mg (had gynecomastia with spiro), Entresto 24-26 mg. Inpatient GDMT: Continue with carvedilol 3.125 mg twice daily (okay for now) At DC needs to follow-up with advanced heart failure  PER IM: Active Problems:   essential hypertension   Waldenstrom macroglobulinemia   ARF (acute renal failure) (HCC)   Elevated LFTs  ADDENDUM: Patient did not tolerate MRI due to headache, did not want to reattempt. Will get limited echo to reassess if pericardial effusion is present and will formulate CV dispo from there.    For questions or updates, please contact [facility] Please consult [email or web address] for contact info under        Signed, Parke Poisson, MD

## 2024-01-09 NOTE — Progress Notes (Signed)
 Alerted by nursing to large bruise LLQ abdomen in Mr. Nuzum today s/p lovenox injection.  See progress note/media tab for picture.  Large bruise that apears to be hematoma lower abdomen.  I'm currently at a different hospital, so cannot examine him.    Vitals are good.  Will obtain CBC and add PT/INR/PTT.  Likely hit a superficial vein with injection, which will cause self-limited bleeding into subcutaneous tissues.  If vitals change, obtain stat H/H.

## 2024-01-09 NOTE — Progress Notes (Addendum)
 Progress Note   Patient: Connor Solomon ZOX:[id number] DOB: 07-12-1961 DOA: 01/04/2024     4 DOS: the patient was seen and examined on 01/09/2024   Brief hospital course: Connor Solomon is a 63 y.o. male with history of Waldenstrm's macroglobulinemia, nonischemic cardiomyopathy last EF measured was 25% with grade 1 diastolic dysfunction on 12/22, hypertension, anxiety, chronic pain presents to the ER because of worsening shortness of breath.  Patient is being treated for Waldenstrm's macroglobulinemia by Dr. Leonides Schanz oncologist and is on ibrutinib/rituximab.  4 to 5 days prior to admission reported increasing exertional dyspnea and increasing lower extremity edema.  Patient states he used to be on Lasix which was discontinued few months ago.  Has some chest tightness and also had recent upper respiratory infection and a month ago also had some palpitations.  Came to ER for same symptoms and was admitted for presumed CHF exacerbation.  Diuresed well and was moving towards discharge however had sudden onset of chest pain 3/29.  Workup revealed concern for pericarditis, cardiology reconsulted.  Assessment and Plan: Pleuritic chest pain: -Worrisome for pericarditis in light of symptoms, minimal elevation of troponins, ESR/CRP. -Status post colchicine which has helped with his pain. -No GI upset. -Plan today was for cardiac MRI but patient was unable to tolerate.  Limited echo ordered for today. Appreciate cardiology input. -Had several EKGs in the night.  Unclear reason why.  Patient reports he did not have any change in his chest pain or shortness of breath last night. -Day nurse unsure what happened  Acute on chronic HFrEF  -Working etiology is stopping his diuretics outpatient.  Reports he was told by his outpatient physician to stop his Lasix. -Restarted oral Lasix today.  Still appears euvolemic we will likely need this ongoing.  Recurrent tachycardia: - appreciate Dr. Erin Hearing review of  rhythm strips 3/27.  - bursts of tachycardia deemed SVT - Goal will be K+ > 4 in this cardiac patient with ongoing muscle cramps, diuresis, and SVT  Leukocytosis: - WBC 18 3/30, 17 today 3/31.   - elevated s/p his sudden chest pain on 3/30.   - still with mild, nonproductive cough, unchanged from admit.  No urinary symptom.  Intermittent CP as above.  No abd pain.  Afebrile.   - no on any meds that would obviously raise WBCs - blood/urine cultures still pending.  CXR showed improvement -Likely secondary to pericarditis.  No signs of infection.  Acute renal failure  -Resolved. -Will follow creatinine in light of ongoing diuresis.  Waldenstrm's macroglobulinemia  - being followed by oncologist.  On ibrutinib and rituximab.  Hyponatremia:  -Has returned today. -Likely hypervolemic. -Lasix restarted today. -Trend  Hypokalemia: -Now actually hyperkalemic today. -Diuretics had been held since 3/27 and he was on scheduled potassium. -Potassium discontinued. -Follow and recheck BMP  Chronic pain  - on methadone and oxycodone.  Medications confirmed on PDMP website.  Mildly elevated LFTs  -Secondary to CHF exacerbation -Resolved on recheck  Metabolic acidosis: -Resolved with ongoing diuresis.     Subjective: Much improvement in chest pain today.  Still minimal though nothing compared to yesterday.  Eating and drinking well.  No nausea or vomiting.Marland Kitchen  Physical Exam: Vitals:   01/08/24 1308 01/08/24 1500 01/08/24 2049 01/09/24 0545  BP: 110/71 113/63 110/67 117/75  Pulse: 80  90 (!) 102  Resp: 20  18 20   Temp: 97.6 F (36.4 C)  98.3 F (36.8 C) 98.2 F (36.8 C)  TempSrc: Oral  Oral Oral  SpO2: 98%  99% 96%  Weight:      Height:       Eyes: Anicteric no pallor. ENMT: No discharge from the ears eyes nose or mouth. Neck: JVD normal Lungs: Lungs clear without adventitious breath sounds Cardiac:  Regular rate and rhythm.  Chest nontender to palpation Abdomen: Soft  nontender bowel sound present. Extremities: Trace edema bilateral extremities. Skin: No rash. Neurologic: Alert awake oriented to time place and person.  Moves all extremities. Psychiatric: Appears normal.  Normal affect.   Disposition: Status is: Inpatient Dispo: Continue IV diuresis  Planned Discharge Destination: Home    Time spent: 35 minutes  Author: Tobey Grim, MD 01/09/2024 7:31 AM  For on call review [email or web address].

## 2024-01-09 NOTE — Progress Notes (Signed)
  Echocardiogram 2D Echocardiogram has been performed.  Connor Solomon 01/09/2024, 1:22 PM

## 2024-01-10 ENCOUNTER — Other Ambulatory Visit (HOSPITAL_COMMUNITY): Payer: Self-pay

## 2024-01-10 ENCOUNTER — Other Ambulatory Visit (HOSPITAL_COMMUNITY): Payer: Self-pay | Admitting: Family Medicine

## 2024-01-10 ENCOUNTER — Other Ambulatory Visit: Payer: Self-pay | Admitting: Nurse Practitioner

## 2024-01-10 ENCOUNTER — Telehealth (HOSPITAL_COMMUNITY): Payer: Self-pay | Admitting: Internal Medicine

## 2024-01-10 DIAGNOSIS — I301 Infective pericarditis: Secondary | ICD-10-CM

## 2024-01-10 DIAGNOSIS — R0781 Pleurodynia: Secondary | ICD-10-CM | POA: Diagnosis not present

## 2024-01-10 DIAGNOSIS — C8800 Waldenstrom macroglobulinemia not having achieved remission: Secondary | ICD-10-CM

## 2024-01-10 DIAGNOSIS — G893 Neoplasm related pain (acute) (chronic): Secondary | ICD-10-CM

## 2024-01-10 DIAGNOSIS — Z515 Encounter for palliative care: Secondary | ICD-10-CM

## 2024-01-10 DIAGNOSIS — I5043 Acute on chronic combined systolic (congestive) and diastolic (congestive) heart failure: Secondary | ICD-10-CM | POA: Diagnosis not present

## 2024-01-10 DIAGNOSIS — I5021 Acute systolic (congestive) heart failure: Secondary | ICD-10-CM | POA: Diagnosis not present

## 2024-01-10 DIAGNOSIS — I428 Other cardiomyopathies: Secondary | ICD-10-CM | POA: Diagnosis not present

## 2024-01-10 LAB — CBC
HCT: 36.1 % — ABNORMAL LOW (ref 39.0–52.0)
Hemoglobin: 11 g/dL — ABNORMAL LOW (ref 13.0–17.0)
MCH: 25.9 pg — ABNORMAL LOW (ref 26.0–34.0)
MCHC: 30.5 g/dL (ref 30.0–36.0)
MCV: 85.1 fL (ref 80.0–100.0)
Platelets: 223 10*3/uL (ref 150–400)
RBC: 4.24 MIL/uL (ref 4.22–5.81)
RDW: 17.2 % — ABNORMAL HIGH (ref 11.5–15.5)
WBC: 10.4 10*3/uL (ref 4.0–10.5)
nRBC: 0 % (ref 0.0–0.2)

## 2024-01-10 LAB — BASIC METABOLIC PANEL WITH GFR
Anion gap: 10 (ref 5–15)
BUN: 20 mg/dL (ref 8–23)
CO2: 23 mmol/L (ref 22–32)
Calcium: 8.5 mg/dL — ABNORMAL LOW (ref 8.9–10.3)
Chloride: 99 mmol/L (ref 98–111)
Creatinine, Ser: 0.83 mg/dL (ref 0.61–1.24)
GFR, Estimated: >60 mL/min (ref >60)
Glucose, Bld: 88 mg/dL (ref 70–99)
Potassium: 3.8 mmol/L (ref 3.5–5.1)
Sodium: 132 mmol/L — ABNORMAL LOW (ref 135–145)

## 2024-01-10 LAB — PROTIME-INR
INR: 1.3 — ABNORMAL HIGH (ref 0.8–1.2)
Prothrombin Time: 15.9 s — ABNORMAL HIGH (ref 11.4–15.2)

## 2024-01-10 LAB — APTT: aPTT: 33 s (ref 24–36)

## 2024-01-10 MED ORDER — FAMOTIDINE 20 MG PO TABS
20.0000 mg | ORAL_TABLET | Freq: Every day | ORAL | 0 refills | Status: DC
Start: 2024-01-10 — End: 2024-03-22
  Filled 2024-01-10: qty 90, 90d supply, fill #0

## 2024-01-10 MED ORDER — FUROSEMIDE 40 MG PO TABS
40.0000 mg | ORAL_TABLET | Freq: Every day | ORAL | 0 refills | Status: DC | PRN
  Filled 2024-01-10: qty 30, 30d supply, fill #0

## 2024-01-10 MED ORDER — ENTRESTO 24-26 MG PO TABS
1.0000 | ORAL_TABLET | Freq: Two times a day (BID) | ORAL | 0 refills | Status: DC
  Filled 2024-01-10: qty 60, 30d supply, fill #0

## 2024-01-10 MED ORDER — COLCHICINE 0.6 MG PO TABS
0.6000 mg | ORAL_TABLET | Freq: Two times a day (BID) | ORAL | 0 refills | Status: DC
  Filled 2024-01-10: qty 180, 90d supply, fill #0

## 2024-01-10 MED ORDER — EPLERENONE 25 MG PO TABS
12.5000 mg | ORAL_TABLET | Freq: Every day | ORAL | 0 refills | Status: DC
  Filled 2024-01-10: qty 15, 30d supply, fill #0

## 2024-01-10 MED ORDER — ASPIRIN 325 MG PO TBEC
325.0000 mg | DELAYED_RELEASE_TABLET | Freq: Every day | ORAL | 0 refills | Status: DC
Start: 2024-01-11 — End: 2024-03-22
  Filled 2024-01-10: qty 30, 30d supply, fill #0

## 2024-01-10 MED ORDER — DULOXETINE HCL 30 MG PO CPEP
30.0000 mg | ORAL_CAPSULE | Freq: Every day | ORAL | 0 refills | Status: DC
  Filled 2024-01-10: qty 30, 30d supply, fill #0

## 2024-01-10 NOTE — Progress Notes (Signed)
   Patient Name: Connor Solomon Date of Encounter: 01/10/2024 [facility] Cardiologist: Arvilla Meres, MD   Interval Summary  .    Chest pain modestly improved with colchicine. Per Dr. Leonides Schanz, less likely to be ibrutinib related, will presumptively treat for viral pericarditis with elevated inflammatory markers.   Vital Signs .    Vitals:   01/10/24 0256 01/10/24 0415 01/10/24 0415 01/10/24 0841  BP: (!) 93/59  (!) 96/59 119/72  Pulse: 81  83 82  Resp: 18  19   Temp: 97.8 F (36.6 C)  97.7 F (36.5 C)   TempSrc: Oral  Oral   SpO2: 97%  98%   Weight:  54.1 kg    Height:        Intake/Output Summary (Last 24 hours) at 01/10/2024 1413 Last data filed at 01/10/2024 0500 Gross per 24 hour  Intake 360 ml  Output 750 ml  Net -390 ml      01/10/2024    4:15 AM 01/09/2024    9:11 AM 01/08/2024    5:00 AM  Last 3 Weights  Weight (lbs) 119 lb 4.3 oz 120 lb 13 oz 127 lb 3.3 oz  Weight (kg) 54.1 kg 54.8 kg 57.7 kg      Telemetry/ECG    SR PACs  Personally Reviewed  Physical Exam .   GEN: No acute distress.   Neck: No JVD Cardiac: RRR, no murmurs, possible faint rub apically Respiratory: Clear to auscultation bilaterally. GI: Soft, nontender, non-distended  MS: No edema  Assessment & Plan .     #chest pain #pericarditis. - continues to have pleuritic chest pain.  -  plan for po lasix 40 mg prn swelling.  - likely viral pericarditis. -colchicine 0.6 mg BID for chest pain, continue for at least 3 months or until inflammatory markers normalize, if longer than 3 mo. - pt did not tolerate CMR, may not be able to re-attempt. Echo showed pericardial thickening, no constriction, and no effusion.   #NICM - not requiring diuretic at home but had volume overload on presentation, diuresed and has been held since 3/27.  -03/2021 R/LHC no CAD, low filling pressure/preserved cardiac output -04/2021 cardiac MRI EF 25%, normal RV, LGE with nonischemic scar pattern -09/2021 echo  25%, normal RV.  Mild MR PTA GDMT: Carvedilol 3.125 mg twice daily, Farxiga 10 mg, eplerenone 12.5 mg (had gynecomastia with spiro), Entresto 24-26 mg. Inpatient GDMT: Continue with carvedilol 3.125 mg twice daily (okay for now), continue eplerenone at dc, farxiga. Holding entresto at [employer], can resume if needed at follow up.  At DC needs to follow-up with advanced heart failure  PER IM: Active Problems:   essential hypertension   Waldenstrom macroglobulinemia   ARF (acute renal failure) (HCC)   Elevated LFTs   For questions or updates, please contact [facility] Please consult [email or web address] for contact info under        Signed, Parke Poisson, MD

## 2024-01-10 NOTE — Telephone Encounter (Signed)
 Front office left patient a voice message to schedule a post hosp f/u appt in the AHF clinic. Instructed patient that this is Dr. Prescott Gum office and to call back to schedule.

## 2024-01-10 NOTE — Discharge Summary (Signed)
 Physician Discharge Summary  Connor Solomon UEA:[id number] DOB: 1961/09/28 DOA: 01/04/2024  PCP: [name], DO  Admit date: 01/04/2024 Discharge date: 01/10/2024  Admitted From: Home Disposition: Home  Recommendations for Outpatient Follow-up:  Follow up with PCP in 1-2 weeks Please obtain BMP/CBC in one week your next doctors visit.  GDMT as prescribed and mentioned below.  Will have outpatient CHF team follow-up Aspirin for 30 days, colchicine for 3 months prescribed.  While on full dose aspirin, Pepcid prescribed Outpatient follow-up with hematology/oncology, Dr. Leonides Schanz   Discharge Condition: Stable CODE STATUS: Full code Diet recommendation: Heart healthy  Brief/Interim Summary: Brief Narrative:  63 year old with history of Waldenstrm's macroglobulinemia, nonischemic CM EF 25% grade 1 DD, HTN, anxiety, chronic pain comes to the ER with shortness of breath.  For Waldenstrm's macroglobulinemia follows Dr. Leonides Schanz and is on ibrutinib and rituximab.  Upon admission diagnosed with CHF exacerbation but also developed pleuritic chest pain concerning for pericarditis.  Patient is unable to tolerate cardiac MRI.  Cardiology team has been following.  Limited echocardiogram done which showed some pericardial thickening.  Cardiology recommending discharging him on colchicine with outpatient follow-up.  Medically stable for discharge today.   Assessment & Plan:  Principal Problem:   Acute CHF (congestive heart failure) (HCC) Active Problems:   Benign essential hypertension   Pleuritic chest pain   Waldenstrom macroglobulinemia   Acute on chronic combined systolic and diastolic CHF (congestive heart failure) (HCC)   ARF (acute renal failure) (HCC)   Elevated LFTs   Chest pain of uncertain etiology   NICM (nonischemic cardiomyopathy) (HCC)   Acute chest pain, likely pericarditis/pleuritic - This most likely appears to be pericarditis.  Currently on colchicine which is helping the  patient.  Unable to tolerate MRI therefore echocardiogram obtained showing some pericardial thickening concerning for pericarditis.  Will be discharged on 3 months of colchicine and aspirin.  While on full dose aspirin, have added Pepcid as well.  Outpatient cardiology follow-up will be arranged by their service - Cardiology following  Acute congestive heart failure with reduced EF, EF 25% -Overall approaching euvolemia.  Upon discharge patient will be on Lasix 40 mg as needed. -GDMT-Coreg, eplerenone, Marcelline Deist, Entresto  Recurrent tachycardia, SVT bursts -Heart rate is better controlled  Leukocytosis -Resolved  Acute renal failure -Baseline creatinine 0.8, peaked at 1.33.  Now back to baseline.  Waldenstrm's macroglobulinemia -Follows outpatient oncology Dr. Leonides Schanz on ibrutinib and rituximab.  Discussed with Dr. Leonides Schanz and confirmed that ibrutinib is not known to cause it pericarditis but can cause arrhythmia.  Will continue these and have him discuss outpatient  Chronic pain -Continue methadone  Anxiety/depression -On as needed Valium.  Cymbalta   DVT prophylaxis: enoxaparin (LOVENOX) injection 40 mg Start: 01/04/24 1000    Code Status: Full Code Family Communication:   Discharge today    Subjective:  Feels okay no complaints  Examination:  General exam: Appears calm and comfortable  Respiratory system: Clear to auscultation. Respiratory effort normal. Cardiovascular system: S1 & S2 heard, RRR. No JVD, murmurs, rubs, gallops or clicks. No pedal edema. Gastrointestinal system: Abdomen is nondistended, soft and nontender. No organomegaly or masses felt. Normal bowel sounds heard. Central nervous system: Alert and oriented. No focal neurological deficits. Extremities: Symmetric 5 x 5 power. Skin: No rashes, lesions or ulcers Psychiatry: Judgement and insight appear normal. Mood & affect appropriate.    Discharge Diagnoses:  Principal Problem:   Acute CHF (congestive  heart failure) (HCC) Active Problems:   Benign essential hypertension  Pleuritic chest pain   Waldenstrom macroglobulinemia   Acute on chronic combined systolic and diastolic CHF (congestive heart failure) (HCC)   ARF (acute renal failure) (HCC)   Elevated LFTs   Chest pain of uncertain etiology   NICM (nonischemic cardiomyopathy) (HCC)      Discharge Exam: Vitals:   01/10/24 0415 01/10/24 0841  BP: (!) 96/59 119/72  Pulse: 83 82  Resp: 19   Temp: 97.7 F (36.5 C)   SpO2: 98%    Vitals:   01/10/24 0256 01/10/24 0415 01/10/24 0415 01/10/24 0841  BP: (!) 93/59  (!) 96/59 119/72  Pulse: 81  83 82  Resp: 18  19   Temp: 97.8 F (36.6 C)  97.7 F (36.5 C)   TempSrc: Oral  Oral   SpO2: 97%  98%   Weight:  54.1 kg    Height:          Discharge Instructions  Discharge Instructions     meds to beds pharmacy consult ([facility] ONLY)   Complete by: As directed       Allergies as of 01/10/2024       Reactions   Rituxan [rituximab] Other (See Comments), Cough   Mild chest discomfort & rigors   Hydrocodone Itching, Nausea And Vomiting        Medication List     STOP taking these medications    azithromycin 250 MG tablet Commonly known as: Zithromax Z-Pak   dronabinol 5 MG capsule Commonly known as: MARINOL   potassium chloride SA 20 MEQ tablet Commonly known as: KLOR-CON M       TAKE these medications    acetaminophen 500 MG tablet Commonly known as: TYLENOL Take 500 mg by mouth every 6 (six) hours as needed.   albuterol 108 (90 Base) MCG/ACT inhaler Commonly known as: ProAir HFA Inhale 2 puffs into the lungs every 6 (six) hours as needed for wheezing or shortness of breath.   aspirin 325 MG tablet Take 1 tablet (325 mg total) by mouth daily. Start taking on: January 11, 2024   carvedilol 3.125 MG tablet Commonly known as: COREG Take 1 tablet (3.125 mg total) by mouth 2 (two) times daily. NEEDS FOLLOW UP APPOINTMENT FOR MORE REFILLS    clobetasol cream 0.05 % Commonly known as: TEMOVATE Apply 1 Application topically 2 (two) times daily. Apply topically to the arms and hands twice daily for up to two weeks   colchicine 0.6 MG tablet Take 1 tablet (0.6 mg total) by mouth 2 (two) times daily.   diazepam 5 MG tablet Commonly known as: VALIUM Take 1 tablet (5 mg total) by mouth every 12 (twelve) hours as needed for anxiety.   DULoxetine 30 MG capsule Commonly known as: CYMBALTA Take 1 capsule (30 mg total) by mouth daily.   Entresto 24-26 MG Generic drug: sacubitril-valsartan Take 1 tablet by mouth 2 (two) times daily.   eplerenone 25 MG tablet Commonly known as: INSPRA Take 0.5 tablets (12.5 mg total) by mouth daily. NEEDS FOLLOW UP APPOINTMENT FOR MORE REFILLS   famotidine 20 MG tablet Commonly known as: PEPCID Take 1 tablet (20 mg total) by mouth at bedtime.   Farxiga 10 MG Tabs tablet Generic drug: dapagliflozin propanediol Take 1 tablet (10 mg total) by mouth daily before breakfast. NEEDS FOLLOW UP APPOINTMENT FOR ANYMORE REFILLS   FeroSul 325 (65 Fe) MG tablet Generic drug: ferrous sulfate Take 1 tablet by mouth daily with breakfast. Please take with a source of Vitamin C  FT Antiseptic Skin Cleanser 4 % external liquid Generic drug: chlorhexidine Apply topically daily as needed. Dilute with water and clean wound with this.  Rinse thoroughly afterwards   furosemide 40 MG tablet Commonly known as: LASIX Take 1 tablet (40 mg total) by mouth daily as needed for fluid or edema.   ibrutinib 420 MG tablet Commonly known as: IMBRUVICA Take 1 tablet (420 mg total) by mouth daily.   methadone 10 MG tablet Commonly known as: DOLOPHINE Take 1 tablet (10 mg total) by mouth every 8 (eight) hours.   naloxone 4 MG/0.1ML Liqd nasal spray kit Commonly known as: NARCAN Take for overdose   naloxone 4 MG/0.1ML Liqd nasal spray kit Commonly known as: NARCAN Take for overdose as directed   ondansetron 8 MG  tablet Commonly known as: ZOFRAN Take 1 tablet (8 mg) by mouth every 8 hours as needed.   Oxycodone HCl 10 MG Tabs Take 1 tablet (10 mg total) by mouth every 4 (four) hours as needed.   sildenafil 50 MG tablet Commonly known as: Viagra Take 1 tablet (50 mg total) by mouth daily as needed for erectile dysfunction.        Follow-up Information     [name], DO Follow up in 1 week(s).   Specialty: Internal Medicine Contact information: [address] [place] [address] [phone]                Allergies  Allergen Reactions   Rituxan [Rituximab] Other (See Comments) and Cough    Mild chest discomfort & rigors   Hydrocodone Itching and Nausea And Vomiting    You were cared for by a hospitalist during your hospital stay. If you have any questions about your discharge medications or the care you received while you were in the hospital after you are discharged, you can call the unit and asked to speak with the hospitalist on call if the hospitalist that took care of you is not available. Once you are discharged, your primary care physician will handle any further medical issues. Please note that no refills for any discharge medications will be authorized once you are discharged, as it is imperative that you return to your primary care physician (or establish a relationship with a primary care physician if you do not have one) for your aftercare needs so that they can reassess your need for medications and monitor your lab values.  You were cared for by a hospitalist during your hospital stay. If you have any questions about your discharge medications or the care you received while you were in the hospital after you are discharged, you can call the unit and asked to speak with the hospitalist on call if the hospitalist that took care of you is not available. Once you are discharged, your primary care physician will handle any further medical issues. Please note that NO REFILLS  for any discharge medications will be authorized once you are discharged, as it is imperative that you return to your primary care physician (or establish a relationship with a primary care physician if you do not have one) for your aftercare needs so that they can reassess your need for medications and monitor your lab values.  Please request your Prim.MD to go over all Hospital Tests and Procedure/Radiological results at the follow up, please get all Hospital records sent to your Prim MD by signing hospital release before you go home.  Get CBC, CMP, 2 view Chest X ray checked  by Primary  MD during your next visit or SNF MD in 5-7 days ( we routinely change or add medications that can affect your baseline labs and fluid status, therefore we recommend that you get the mentioned basic workup next visit with your PCP, your PCP may decide not to get them or add new tests based on their clinical decision)  On your next visit with your primary care physician please Get Medicines reviewed and adjusted.  If you experience worsening of your admission symptoms, develop shortness of breath, life threatening emergency, suicidal or homicidal thoughts you must seek medical attention immediately by calling 911 or calling your MD immediately  if symptoms less severe.  You Must read complete instructions/literature along with all the possible adverse reactions/side effects for all the Medicines you take and that have been prescribed to you. Take any new Medicines after you have completely understood and accpet all the possible adverse reactions/side effects.   Do not drive, operate heavy machinery, perform activities at heights, swimming or participation in water activities or provide baby sitting services if your were admitted for syncope or siezures until you have seen by Primary MD or a Neurologist and advised to do so again.  Do not drive when taking Pain medications.   Procedures/Studies: ECHOCARDIOGRAM  LIMITED Result Date: 01/09/2024    ECHOCARDIOGRAM LIMITED REPORT   Patient Name:   Connor Solomon Date of Exam: 01/09/2024 Medical Rec #:  [id number]     Height:       68.0 in Accession #:    [id number]    Weight:       120.8 lb Date of Birth:  29-May-1961     BSA:          1.650 m Patient Age:    62 years      BP:           91/69 mmHg Patient Gender: M             HR:           70 bpm. Exam Location:  Inpatient Procedure: Limited Echo (Both Spectral and Color Flow Doppler were utilized            during procedure). Indications:    I31.3 Pericardial effusion (noninflammatory)  History:        Patient has prior history of Echocardiogram examinations, most                 recent 01/04/2024. CHF and Cardiomyopathy, Mitral Valve Disease,                 Signs/Symptoms:Chest Pain and Dyspnea; Risk                 Factors:Hypertension. Pericardial effusion.  Sonographer:    Sheralyn Boatman RDCS Referring Phys: [id number] Lynda Rainwater A ACHARYA IMPRESSIONS  1. There is no left ventricular thrombus. Left ventricular ejection fraction, by estimation, is <20%. The left ventricle has severely decreased function. The left ventricle demonstrates global hypokinesis. The left ventricular internal cavity size was moderately dilated. Left ventricular diastolic parameters are consistent with Grade III diastolic dysfunction (restrictive).  2. Right ventricular systolic function is normal. The right ventricular size is normal.  3. The pericardium is circumferentially thickened. There is no respiratory flow variation across the mitral or tricuspid valve to support a diagnosis of constrictive pericarditis.  4. The mitral valve is normal in structure. No evidence of mitral valve regurgitation. No evidence of mitral stenosis.  5. The aortic valve is normal  in structure. Aortic valve regurgitation is not visualized. No aortic stenosis is present.  6. The inferior vena cava is dilated in size with <50% respiratory variability, suggesting right atrial pressure  of 15 mmHg. Comparison(s): Prior images reviewed side by side. There was a trivial amount of anterior pericardial effusion on the previous study, which has now resolved. Otherwise no change. FINDINGS  Left Ventricle: There is no left ventricular thrombus. Left ventricular ejection fraction, by estimation, is <20%. The left ventricle has severely decreased function. The left ventricle demonstrates global hypokinesis. The left ventricular internal cavity size was moderately dilated. There is no left ventricular hypertrophy. Left ventricular diastolic parameters are consistent with Grade III diastolic dysfunction (restrictive). Right Ventricle: The right ventricular size is normal. No increase in right ventricular wall thickness. Right ventricular systolic function is normal. Left Atrium: Left atrial size was normal in size. Right Atrium: Right atrial size was normal in size. Pericardium: The pericardium is circumferentially thickened. There is no respiratory flow variation across the mitral or tricuspid valve to support a diagnosis of constrictive pericarditis. There is no evidence of pericardial effusion. Mitral Valve: The mitral valve is normal in structure. No evidence of mitral valve stenosis. Tricuspid Valve: The tricuspid valve is normal in structure. Tricuspid valve regurgitation is not demonstrated. No evidence of tricuspid stenosis. Aortic Valve: The aortic valve is normal in structure. Aortic valve regurgitation is not visualized. No aortic stenosis is present. Pulmonic Valve: The pulmonic valve was normal in structure. Pulmonic valve regurgitation is not visualized. No evidence of pulmonic stenosis. Aorta: The aortic root is normal in size and structure. Venous: The inferior vena cava is dilated in size with less than 50% respiratory variability, suggesting right atrial pressure of 15 mmHg. IAS/Shunts: No atrial level shunt detected by color flow Doppler. IVC IVC diam: 2.20 cm Mihai Croitoru MD  Electronically signed by Thurmon Fair MD Signature Date/Time: 01/09/2024/2:29:37 PM    Final    CT HEAD WO CONTRAST ( ) Result Date: 01/07/2024 CLINICAL DATA:  Mental status change, unknown cause EXAM: CT HEAD WITHOUT CONTRAST TECHNIQUE: Contiguous axial images were obtained from the base of the skull through the vertex without intravenous contrast. RADIATION DOSE REDUCTION: This exam was performed according to the departmental dose-optimization program which includes automated exposure control, adjustment of the mA and/or kV according to patient size and/or use of iterative reconstruction technique. COMPARISON:  CT head 05/31/2020 FINDINGS: Brain: No evidence of acute infarction, hemorrhage, hydrocephalus, extra-axial collection or mass lesion/mass effect. Vascular: Calcific atherosclerosis. Skull: No acute fracture. Sinuses/Orbits: Mostly clear sinuses.  No acute orbital findings. Other: No mastoid effusions. IMPRESSION: No evidence of acute intracranial abnormality. Electronically Signed   By: Feliberto Harts M.D.   On: 01/07/2024 20:15   DG Chest Port 1 View Result Date: 01/07/2024 CLINICAL DATA:  Chest pain. EXAM: PORTABLE CHEST 1 VIEW COMPARISON:  Chest radiographs 01/04/2024 and 07/23/2022. Chest CT 01/04/2024. FINDINGS: 1023 hours. Stable cardiomegaly and aortic atherosclerosis. Previously demonstrated bilateral pleural effusions and bibasilar pulmonary opacities have improved. There is residual mild bibasilar atelectasis. No pneumothorax or definite edema. Skin folds overlie the chest bilaterally. There is an old healed fracture of the right humeral neck. No acute osseous findings are seen. IMPRESSION: Interval improvement in previously demonstrated bilateral pleural effusions and bibasilar pulmonary opacities. No acute cardiopulmonary process identified. Electronically Signed   By: Carey Bullocks M.D.   On: 01/07/2024 10:46   VAS [place] LOWER EXTREMITY VENOUS (DVT) Result Date: 01/04/2024  Lower  Venous DVT Study  Patient Name:  TELLIS SPIVAK  Date of Exam:   01/04/2024 Medical Rec #: [id number]      Accession #:    [id number] Date of Birth: [birth year]      Patient Gender: M Patient Age:   39 years Exam Location:  [facility] Procedure:      VAS [place] LOWER EXTREMITY VENOUS (DVT) Referring Phys: Midge Minium --------------------------------------------------------------------------------  Indications: Edema.  Risk Factors: DVT Hx. Anticoagulation: Lovenox. Comparison Study: None. Performing Technologist: Shona Simpson  Examination Guidelines: A complete evaluation includes B-mode imaging, spectral Doppler, color Doppler, and power Doppler as needed of all accessible portions of each vessel. Bilateral testing is considered an integral part of a complete examination. Limited examinations for reoccurring indications may be performed as noted. The reflux portion of the exam is performed with the patient in reverse Trendelenburg.  +---------+---------------+---------+-----------+----------+--------------+ RIGHT    CompressibilityPhasicitySpontaneityPropertiesThrombus Aging +---------+---------------+---------+-----------+----------+--------------+ CFV      Full           Yes      Yes                                 +---------+---------------+---------+-----------+----------+--------------+ SFJ      Full                                                        +---------+---------------+---------+-----------+----------+--------------+ FV Prox  Full                                                        +---------+---------------+---------+-----------+----------+--------------+ FV Mid   Full                                                        +---------+---------------+---------+-----------+----------+--------------+ FV DistalFull                                                        +---------+---------------+---------+-----------+----------+--------------+ PFV       Full                                                        +---------+---------------+---------+-----------+----------+--------------+ POP      Full           Yes      Yes                                 +---------+---------------+---------+-----------+----------+--------------+ PTV      Full                                                        +---------+---------------+---------+-----------+----------+--------------+  PERO     Full                                                        +---------+---------------+---------+-----------+----------+--------------+   +---------+---------------+---------+-----------+----------+--------------+ LEFT     CompressibilityPhasicitySpontaneityPropertiesThrombus Aging +---------+---------------+---------+-----------+----------+--------------+ CFV      Full           Yes      Yes                                 +---------+---------------+---------+-----------+----------+--------------+ SFJ      Full                                                        +---------+---------------+---------+-----------+----------+--------------+ FV Prox  Full                                                        +---------+---------------+---------+-----------+----------+--------------+ FV Mid   Full                                                        +---------+---------------+---------+-----------+----------+--------------+ FV DistalFull                                                        +---------+---------------+---------+-----------+----------+--------------+ PFV      Full                                                        +---------+---------------+---------+-----------+----------+--------------+ POP      Full           Yes      Yes                                 +---------+---------------+---------+-----------+----------+--------------+ PTV      Full                                                         +---------+---------------+---------+-----------+----------+--------------+ PERO     Full                                                        +---------+---------------+---------+-----------+----------+--------------+  Summary: BILATERAL: - No evidence of deep vein thrombosis seen in the lower extremities, bilaterally. -No evidence of popliteal cyst, bilaterally.   *See table(s) above for measurements and observations. Electronically signed by Coral Else MD on 01/04/2024 at 10:04:37 PM.    Final    CT Angio Chest Pulmonary Embolism (PE) W or WO Contrast Result Date: 01/04/2024 CLINICAL DATA:  Dyspnea with elevated D-dimer EXAM: CT ANGIOGRAPHY CHEST WITH CONTRAST TECHNIQUE: Multidetector CT imaging of the chest was performed using the standard protocol during bolus administration of intravenous contrast. Multiplanar CT image reconstructions and MIPs were obtained to evaluate the vascular anatomy. RADIATION DOSE REDUCTION: This exam was performed according to the departmental dose-optimization program which includes automated exposure control, adjustment of the mA and/or kV according to patient size and/or use of iterative reconstruction technique. CONTRAST:  75mL OMNIPAQUE IOHEXOL 350 MG/ML SOLN COMPARISON:  05/12/2021 FINDINGS: Cardiovascular: Atherosclerotic calcifications of the thoracic aorta are noted. Mild dilatation of the ascending aorta to 4 cm is noted. The pulmonary artery shows a normal branching pattern bilaterally. No intraluminal filling defect to suggest pulmonary embolism is noted. Mild coronary calcifications are seen. The heart is enlarged in size. Minimal pericardial effusion is noted. Mediastinum/Nodes: Thoracic inlet is within normal limits. No hilar or mediastinal adenopathy is noted. The esophagus as visualized is within normal limits. Lungs/Pleura: Lungs are well aerated bilaterally. Bilateral moderate to large pleural effusions are noted with some  compressive atelectasis in the bases. Scattered ground-glass opacities are noted in the upper lobes on the left on image number 32 of series 13 and on the right on image number 64 of series 13. These were not present on the prior exam enter likely inflammatory in nature. The right-sided one measures up to 8 mm. Upper Abdomen: Visualized upper abdomen is within normal limits. Musculoskeletal: Degenerative changes of the thoracic spine are noted. No acute bony abnormality is noted. Review of the MIP images confirms the above findings. IMPRESSION: No evidence of pulmonary emboli. 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 Recommend annual imaging followup by CTA or MRA. This recommendation follows 2010 ACCF/AHA/AATS/ACR/ASA/SCA/SCAI/SIR/STS/SVM Guidelines for the Diagnosis and Management of Patients with Thoracic Aortic Disease. Circulation. 2010; 121: Z610-R604. Aortic aneurysm NOS (ICD10-I71.9) Patchy ground-glass opacity in the upper lobes bilaterally as described. These are likely postinflammatory in nature. Initial follow-up with CT at 6 months is recommended to confirm persistence. If persistent, repeat CT is recommended every 2 years until 5 years of stability has been established. This recommendation follows the consensus statement: Guidelines for Management of Incidental Pulmonary Nodules Detected on CT Images: From the Fleischner Society 2017; Radiology 2017; 284:228-243. Aortic Atherosclerosis (ICD10-I70.0). Electronically Signed   By: Alcide Clever M.D.   On: 01/04/2024 20:03   ECHOCARDIOGRAM COMPLETE Result Date: 01/04/2024    ECHOCARDIOGRAM REPORT   Patient Name:   Connor Solomon Date of Exam: 01/04/2024 Medical Rec #:  [id number]     Height:       68.0 in Accession #:    [id number]    Weight:       134.0 lb Date of Birth:  [birth year]     BSA:          1.724 m Patient Age:    62 years      BP:           118/72 mmHg Patient Gender: M             HR:  66 bpm. Exam Location:  Inpatient Procedure: 2D  Echo, 3D Echo, Cardiac Doppler, Color Doppler, Strain Analysis and            Intracardiac Opacification Agent (Both Spectral and Color Flow            Doppler were utilized during procedure). Indications:    Congestive Heart Failure  History:        Patient has prior history of Echocardiogram examinations, most                 recent 09/14/2021. Risk Factors:Hypertension and Former Smoker.  Sonographer:    Karma Ganja Referring Phys: [id number] Eduard Clos  Sonographer Comments: Global longitudinal strain was attempted. IMPRESSIONS  1. Left ventricular ejection fraction, by estimation, is <20%. Left ventricular ejection fraction by 3D volume is 26 %. The left ventricle has severely decreased function. The left ventricle has no regional wall motion abnormalities. The left ventricular internal cavity size was moderately dilated. Left ventricular diastolic parameters are consistent with Grade III diastolic dysfunction (restrictive). The average left ventricular global longitudinal strain is -3.9 %. The global longitudinal strain is abnormal.  2. Right ventricular systolic function is normal. The right ventricular size is normal.  3. Left atrial size was severely dilated.  4. Right atrial size was mildly dilated.  5. The mitral valve is normal in structure. Mild mitral valve regurgitation. No evidence of mitral stenosis.  6. The aortic valve is normal in structure. Aortic valve regurgitation is trivial. No aortic stenosis is present.  7. The inferior vena cava is normal in size with <50% respiratory variability, suggesting right atrial pressure of 8 mmHg. Comparison(s): Prior images reviewed side by side. FINDINGS  Left Ventricle: Left ventricular ejection fraction, by estimation, is <20%. Left ventricular ejection fraction by 3D volume is 26 %. The left ventricle has severely decreased function. The left ventricle has no regional wall motion abnormalities. Definity contrast agent was given IV to delineate the left  ventricular endocardial borders. The average left ventricular global longitudinal strain is -3.9 %. Strain was performed and the global longitudinal strain is abnormal. 3D ejection fraction reviewed and evaluated as part of the interpretation. Alternate measurement of EF is felt to be most reflective of LV function. The left ventricular internal cavity size was moderately dilated. There is no left ventricular hypertrophy. Left ventricular diastolic parameters are consistent with Grade III diastolic dysfunction (restrictive). Right Ventricle: The right ventricular size is normal. No increase in right ventricular wall thickness. Right ventricular systolic function is normal. Left Atrium: Left atrial size was severely dilated. Right Atrium: Right atrial size was mildly dilated. Pericardium: There is no evidence of pericardial effusion. Mitral Valve: The mitral valve is normal in structure. Mild mitral valve regurgitation. No evidence of mitral valve stenosis. Tricuspid Valve: The tricuspid valve is normal in structure. Tricuspid valve regurgitation is not demonstrated. No evidence of tricuspid stenosis. Aortic Valve: The aortic valve is normal in structure. Aortic valve regurgitation is trivial. No aortic stenosis is present. Aortic valve mean gradient measures 3.0 mmHg. Aortic valve peak gradient measures 5.2 mmHg. Aortic valve area, by VTI measures 1.84 cm. Pulmonic Valve: The pulmonic valve was normal in structure. Pulmonic valve regurgitation is not visualized. No evidence of pulmonic stenosis. Aorta: The aortic root is normal in size and structure. Venous: The inferior vena cava is normal in size with less than 50% respiratory variability, suggesting right atrial pressure of 8 mmHg. IAS/Shunts: No atrial level shunt detected by color flow  Doppler. Additional Comments: 3D was performed not requiring image post processing on an independent workstation and was abnormal.  LEFT VENTRICLE PLAX 2D LVIDd:         6.30 cm          Diastology LVIDs:         6.10 cm         LV e' medial:    3.81 cm/s LV PW:         0.90 cm         LV E/e' medial:  13.3 LV IVS:        0.90 cm         LV e' lateral:   7.07 cm/s LVOT diam:     2.10 cm         LV E/e' lateral: 7.2 LV SV:         31 LV SV Index:   18              2D Longitudinal LVOT Area:     3.46 cm        Strain                                2D Strain GLS   -3.9 %                                Avg: LV Volumes (MOD) LV vol d, MOD    244.0 ml      3D Volume EF A2C:                           LV 3D EF:    Left LV vol d, MOD    252.0 ml                   ventricul A4C:                                        ar LV vol s, MOD    193.0 ml                   ejection A2C:                                        fraction LV vol s, MOD    217.0 ml                   by 3D A4C:                                        volume is LV SV MOD A2C:   51.0 ml                    26 %. LV SV MOD A4C:   252.0 ml LV SV MOD BP:    48.3 ml  3D Volume EF:                                3D EF:        26 %                                LV EDV:       261 ml                                LV ESV:       194 ml                                LV SV:        67 ml RIGHT VENTRICLE            IVC RV Basal diam:  3.50 cm    IVC diam: 1.90 cm RV S prime:     7.72 cm/s TAPSE (M-mode): 1.5 cm LEFT ATRIUM              Index        RIGHT ATRIUM           Index LA diam:        5.40 cm  3.13 cm/m   RA Area:     21.40 cm LA Vol (A2C):   119.0 ml 69.03 ml/m  RA Volume:   64.80 ml  37.59 ml/m LA Vol (A4C):   92.8 ml  53.83 ml/m LA Biplane Vol: 108.0 ml 62.65 ml/m  AORTIC VALVE AV Area (Vmax):    2.11 cm AV Area (Vmean):   1.88 cm AV Area (VTI):     1.84 cm AV Vmax:           114.00 cm/s AV Vmean:          77.400 cm/s AV VTI:            0.167 m AV Peak Grad:      5.2 mmHg AV Mean Grad:      3.0 mmHg LVOT Vmax:         69.40 cm/s LVOT Vmean:        42.000 cm/s LVOT VTI:          0.089 m LVOT/AV VTI  ratio: 0.53  AORTA Ao Root diam: 3.50 cm MITRAL VALVE MV Area (PHT): 3.99 cm    SHUNTS MV Decel Time: 190 msec    Systemic VTI:  0.09 m MR Peak grad: 69.4 mmHg    Systemic Diam: 2.10 cm MR Mean grad: 39.0 mmHg MR Vmax:      416.50 cm/s MR Vmean:     287.0 cm/s MV E velocity: 50.60 cm/s MV A velocity: 73.30 cm/s MV E/A ratio:  0.69 Donato Schultz MD Electronically signed by Donato Schultz MD Signature Date/Time: 01/04/2024/3:16:04 PM    Final    US RENAL Result Date: 01/04/2024 CLINICAL DATA:  Acute renal failure EXAM: RENAL / URINARY TRACT ULTRASOUND COMPLETE COMPARISON:  Abdominal CT 04/07/2021 FINDINGS: Right Kidney: Renal measurements: 10 x 5.4 x 5.8 cm = volume: 160 mL. Mildly echogenic cortex with corticomedullary prominence. No mass or hydronephrosis visualized. Left Kidney: Renal measurements: 9 x 5 x 5 cm = volume: 120 mL. Mildly echogenic  cortex with corticomedullary prominence. 5 mm stone with twinkle artifact, nonobstructive. Mild hydronephrosis. Bladder: Full urinary bladder with some internal echoes attributed to debris. Jets were not seen on either side. Other: Bilateral pleural effusion which is simple where covered. IMPRESSION: 1. Mild left hydronephrosis.Left nephrolithiasis. 2. Medical renal disease with mild atrophy on the left. 3. Moderate distension of the urinary bladder which contains some debris, correlate with urinalysis. Electronically Signed   By: Tiburcio Pea M.D.   On: 01/04/2024 07:59   DG Chest Portable 1 View Result Date: 01/04/2024 CLINICAL DATA:  Shortness of breath. EXAM: PORTABLE CHEST 1 VIEW COMPARISON:  07/23/2022 FINDINGS: The heart size appears enlarged which may be exacerbated by portable technique. There are small bilateral pleural effusions left greater than right. Increase interstitial markings are noted bilaterally. Right upper and bilateral lower lobe airspace opacities noted. Visualized osseous structures appear grossly intact. IMPRESSION: 1. Cardiomegaly, small  bilateral pleural effusions and increased interstitial markings compatible with congestive heart failure. 2. Right upper and bilateral lower lobe airspace opacities may reflect edema or pneumonia. Electronically Signed   By: Signa Kell M.D.   On: 01/04/2024 05:44     The results of significant diagnostics from this hospitalization (including imaging, microbiology, ancillary and laboratory) are listed below for reference.     Microbiology: Recent Results (from the past 240 hours)  Resp panel by RT-PCR (RSV, Flu A&B, Covid) Anterior Nasal Swab     Status: None   Collection Time: 01/04/24  4:25 AM   Specimen: Anterior Nasal Swab  Result Value Ref Range Status   SARS Coronavirus 2 by RT PCR NEGATIVE NEGATIVE Final    Comment: (NOTE) SARS-CoV-2 target nucleic acids are NOT DETECTED.  The SARS-CoV-2 RNA is generally detectable in upper respiratory specimens during the acute phase of infection. The lowest concentration of SARS-CoV-2 viral copies this assay can detect is 138 copies/mL. A negative result does not preclude SARS-Cov-2 infection and should not be used as the sole basis for treatment or other patient management decisions. A negative result may occur with  improper specimen collection/handling, submission of specimen other than nasopharyngeal swab, presence of viral mutation(s) within the areas targeted by this assay, and inadequate number of viral copies(<138 copies/mL). A negative result must be combined with clinical observations, patient history, and epidemiological information. The expected result is Negative.  Fact Sheet for Patients:  BloggerCourse.com  Fact Sheet for Healthcare Providers:  SeriousBroker.it  This test is no t yet approved or cleared by the [place] FDA and  has been authorized for detection and/or diagnosis of SARS-CoV-2 by FDA under an Emergency Use Authorization (EUA). This EUA will remain   in effect (meaning this test can be used) for the duration of the COVID-19 declaration under Section 564(b)(1) of the Act, 21 U.S.C.section 360bbb-3(b)(1), unless the authorization is terminated  or revoked sooner.       Influenza A by PCR NEGATIVE NEGATIVE Final   Influenza B by PCR NEGATIVE NEGATIVE Final    Comment: (NOTE) The Xpert Xpress SARS-CoV-2/FLU/RSV plus assay is intended as an aid in the diagnosis of influenza from Nasopharyngeal swab specimens and should not be used as a sole basis for treatment. Nasal washings and aspirates are unacceptable for Xpert Xpress SARS-CoV-2/FLU/RSV testing.  Fact Sheet for Patients: BloggerCourse.com  Fact Sheet for Healthcare Providers: SeriousBroker.it  This test is not yet approved or cleared by the [place] FDA and has been authorized for detection and/or diagnosis of SARS-CoV-2 by FDA under an  Emergency Use Authorization (EUA). This EUA will remain in effect (meaning this test can be used) for the duration of the COVID-19 declaration under Section 564(b)(1) of the Act, 21 U.S.C. section 360bbb-3(b)(1), unless the authorization is terminated or revoked.     Resp Syncytial Virus by PCR NEGATIVE NEGATIVE Final    Comment: (NOTE) Fact Sheet for Patients: BloggerCourse.com  Fact Sheet for Healthcare Providers: SeriousBroker.it  This test is not yet approved or cleared by the [place] FDA and has been authorized for detection and/or diagnosis of SARS-CoV-2 by FDA under an Emergency Use Authorization (EUA). This EUA will remain in effect (meaning this test can be used) for the duration of the COVID-19 declaration under Section 564(b)(1) of the Act, 21 U.S.C. section 360bbb-3(b)(1), unless the authorization is terminated or revoked.  Performed at [facility], [address] [place], [place]  [address]   MRSA Next Gen by PCR, Nasal     Status: Abnormal   Collection Time: 01/05/24  5:48 PM   Specimen: Nasal Mucosa; Nasal Swab  Result Value Ref Range Status   MRSA by PCR Next Gen DETECTED (A) NOT DETECTED Final    Comment: (NOTE) The GeneXpert MRSA Assay (FDA approved for NASAL specimens only), is one component of a comprehensive MRSA colonization surveillance program. It is not intended to diagnose MRSA infection nor to guide or monitor treatment for MRSA infections. Test performance is not FDA approved in patients less than 36 years old. Performed at [facility], [address] [place], [place] [address]   Culture, blood (Routine X 2) w Reflex to ID Panel     Status: None (Preliminary result)   Collection Time: 01/08/24 11:23 AM   Specimen: BLOOD RIGHT ARM  Result Value Ref Range Status   Specimen Description   Final    BLOOD RIGHT ARM Performed at [facility], [address] [place], [place] [address]    Special Requests   Final    BOTTLES DRAWN AEROBIC AND ANAEROBIC Blood Culture adequate volume Performed at [facility], [address] [place], [place] [address]    Culture   Final    NO GROWTH 2 DAYS Performed at [facility], [address], [place], [place] [address]    Report Status PENDING  Incomplete  Culture, blood (Routine X 2) w Reflex to ID Panel     Status: None (Preliminary result)   Collection Time: 01/08/24 11:29 AM   Specimen: BLOOD RIGHT ARM  Result Value Ref Range Status   Specimen Description   Final    BLOOD RIGHT ARM Performed at [facility], [address], [place], [place] [address]    Special Requests   Final    BOTTLES DRAWN AEROBIC AND ANAEROBIC Blood Culture results may not be optimal due to an inadequate volume of blood received in culture bottles Performed at [facility], [address] [place], [place] [address]    Culture   Final    NO GROWTH 2  DAYS Performed at [facility], [address] [place], [place] [address]    Report Status PENDING  Incomplete     Labs: BNP (last 3 results) Recent Labs    01/04/24 0425  BNP 1,647.7*   Basic Metabolic Panel: Recent Labs  Lab 01/04/24 0755 01/05/24 0357 01/06/24 0328 01/07/24 0422 01/08/24 0340 01/09/24 0319 01/10/24 0402  NA 141   < > 135 136 133* 132*  132*  K 3.2*   < > 3.6 4.8 5.3* 4.5 3.8  CL 112*   < > 94* 101 101 100 99  CO2 19*   < > 28 25 24 23 23   GLUCOSE 73   < > 84 85 85 97 88  BUN 18   < > 21 19 19 17 20   CREATININE 0.97   < > 1.09 0.81 0.83 0.77 0.83  CALCIUM 6.5*   < > 8.2* 8.7* 8.8* 8.7* 8.5*  MG 1.6*  --  1.8 2.3  --   --   --    < > = values in this interval not displayed.   Liver Function Tests: Recent Labs  Lab 01/04/24 0425 01/08/24 0340  AST 25 11*  ALT 47* 19  ALKPHOS 101 81  BILITOT 1.1 1.2  PROT 6.0* 6.0*  ALBUMIN 3.0* 2.8*   No results for input(s): "LIPASE", "AMYLASE" in the last 168 hours. No results for input(s): "AMMONIA" in the last 168 hours. CBC: Recent Labs  Lab 01/04/24 0425 01/04/24 0755 01/05/24 0357 01/06/24 0328 01/08/24 0340 01/09/24 0319 01/10/24 0402  WBC 10.7* 9.1 9.9 9.4 18.7* 17.2* 10.4  NEUTROABS 8.9* 7.8*  --   --   --   --   --   HGB 13.5 13.5 12.1* 12.7* 12.5* 12.3* 11.0*  HCT 44.3 45.2 38.7* 41.4 41.1 39.8 36.1*  MCV 85.2 86.6 84.1 84.0 85.6 86.0 85.1  PLT 401* 368 280 287 270 250 223   Cardiac Enzymes: No results for input(s): "CKTOTAL", "CKMB", "CKMBINDEX", "TROPONINI" in the last 168 hours. BNP: Invalid input(s): "POCBNP" CBG: No results for input(s): "GLUCAP" in the last 168 hours. D-Dimer No results for input(s): "DDIMER" in the last 72 hours. Hgb A1c No results for input(s): "HGBA1C" in the last 72 hours. Lipid Profile No results for input(s): "CHOL", "HDL", "LDLCALC", "TRIG", "CHOLHDL", "LDLDIRECT" in the last 72 hours. Thyroid function studies No results for input(s): "TSH",  "T4TOTAL", "T3FREE", "THYROIDAB" in the last 72 hours.  Invalid input(s): "FREET3" Anemia work up No results for input(s): "VITAMINB12", "FOLATE", "FERRITIN", "TIBC", "IRON", "RETICCTPCT" in the last 72 hours. Urinalysis    Component Value Date/Time   COLORURINE YELLOW 01/08/2024 1236   APPEARANCEUR CLEAR 01/08/2024 1236   LABSPEC 1.021 01/08/2024 1236   PHURINE 6.0 01/08/2024 1236   GLUCOSEU NEGATIVE 01/08/2024 1236   HGBUR NEGATIVE 01/08/2024 1236   HGBUR negative 01/05/2010 1319   BILIRUBINUR NEGATIVE 01/08/2024 1236   KETONESUR NEGATIVE 01/08/2024 1236   PROTEINUR NEGATIVE 01/08/2024 1236   UROBILINOGEN 0.2 01/05/2010 1319   NITRITE NEGATIVE 01/08/2024 1236   LEUKOCYTESUR NEGATIVE 01/08/2024 1236   Sepsis Labs Recent Labs  Lab 01/06/24 0328 01/08/24 0340 01/09/24 0319 01/10/24 0402  WBC 9.4 18.7* 17.2* 10.4   Microbiology Recent Results (from the past 240 hours)  Resp panel by RT-PCR (RSV, Flu A&B, Covid) Anterior Nasal Swab     Status: None   Collection Time: 01/04/24  4:25 AM   Specimen: Anterior Nasal Swab  Result Value Ref Range Status   SARS Coronavirus 2 by RT PCR NEGATIVE NEGATIVE Final    Comment: (NOTE) SARS-CoV-2 target nucleic acids are NOT DETECTED.  The SARS-CoV-2 RNA is generally detectable in upper respiratory specimens during the acute phase of infection. The lowest concentration of SARS-CoV-2 viral copies this assay can detect is 138 copies/mL. A negative result does not preclude SARS-Cov-2 infection and should not be used as the sole basis for treatment or other patient management decisions.  A negative result may occur with  improper specimen collection/handling, submission of specimen other than nasopharyngeal swab, presence of viral mutation(s) within the areas targeted by this assay, and inadequate number of viral copies(<138 copies/mL). A negative result must be combined with clinical observations, patient history, and  epidemiological information. The expected result is Negative.  Fact Sheet for Patients:  BloggerCourse.com  Fact Sheet for Healthcare Providers:  SeriousBroker.it  This test is no t yet approved or cleared by the [place] FDA and  has been authorized for detection and/or diagnosis of SARS-CoV-2 by FDA under an Emergency Use Authorization (EUA). This EUA will remain  in effect (meaning this test can be used) for the duration of the COVID-19 declaration under Section 564(b)(1) of the Act, 21 U.S.C.section 360bbb-3(b)(1), unless the authorization is terminated  or revoked sooner.       Influenza A by PCR NEGATIVE NEGATIVE Final   Influenza B by PCR NEGATIVE NEGATIVE Final    Comment: (NOTE) The Xpert Xpress SARS-CoV-2/FLU/RSV plus assay is intended as an aid in the diagnosis of influenza from Nasopharyngeal swab specimens and should not be used as a sole basis for treatment. Nasal washings and aspirates are unacceptable for Xpert Xpress SARS-CoV-2/FLU/RSV testing.  Fact Sheet for Patients: BloggerCourse.com  Fact Sheet for Healthcare Providers: SeriousBroker.it  This test is not yet approved or cleared by the [place] FDA and has been authorized for detection and/or diagnosis of SARS-CoV-2 by FDA under an Emergency Use Authorization (EUA). This EUA will remain in effect (meaning this test can be used) for the duration of the COVID-19 declaration under Section 564(b)(1) of the Act, 21 U.S.C. section 360bbb-3(b)(1), unless the authorization is terminated or revoked.     Resp Syncytial Virus by PCR NEGATIVE NEGATIVE Final    Comment: (NOTE) Fact Sheet for Patients: BloggerCourse.com  Fact Sheet for Healthcare Providers: SeriousBroker.it  This test is not yet approved or cleared by the [place] FDA and has been  authorized for detection and/or diagnosis of SARS-CoV-2 by FDA under an Emergency Use Authorization (EUA). This EUA will remain in effect (meaning this test can be used) for the duration of the COVID-19 declaration under Section 564(b)(1) of the Act, 21 U.S.C. section 360bbb-3(b)(1), unless the authorization is terminated or revoked.  Performed at [facility], [address], [place], [place] [address]   MRSA Next Gen by PCR, Nasal     Status: Abnormal   Collection Time: 01/05/24  5:48 PM   Specimen: Nasal Mucosa; Nasal Swab  Result Value Ref Range Status   MRSA by PCR Next Gen DETECTED (A) NOT DETECTED Final    Comment: (NOTE) The GeneXpert MRSA Assay (FDA approved for NASAL specimens only), is one component of a comprehensive MRSA colonization surveillance program. It is not intended to diagnose MRSA infection nor to guide or monitor treatment for MRSA infections. Test performance is not FDA approved in patients less than 12 years old. Performed at [facility], [address], [place], [place] [address]   Culture, blood (Routine X 2) w Reflex to ID Panel     Status: None (Preliminary result)   Collection Time: 01/08/24 11:23 AM   Specimen: BLOOD RIGHT ARM  Result Value Ref Range Status   Specimen Description   Final    BLOOD RIGHT ARM Performed at [facility], [address] [place], [place] [address]    Special Requests   Final    BOTTLES DRAWN AEROBIC AND ANAEROBIC  Blood Culture adequate volume Performed at [facility], [address], [place], [place] [address]    Culture   Final    NO GROWTH 2 DAYS Performed at [facility], [address], [place], [place] [address]    Report Status PENDING  Incomplete  Culture, blood (Routine X 2) w Reflex to ID Panel     Status: None (Preliminary result)   Collection Time: 01/08/24 11:29 AM   Specimen: BLOOD RIGHT ARM  Result Value Ref Range Status    Specimen Description   Final    BLOOD RIGHT ARM Performed at [facility], [address], [place], [place] [address]    Special Requests   Final    BOTTLES DRAWN AEROBIC AND ANAEROBIC Blood Culture results may not be optimal due to an inadequate volume of blood received in culture bottles Performed at [facility], [address], [place], [place] [address]    Culture   Final    NO GROWTH 2 DAYS Performed at [facility], [address], [place], [place] [address]    Report Status PENDING  Incomplete     Time coordinating discharge:  I have spent 35 minutes face to face with the patient and on the ward discussing the patients care, assessment, plan and disposition with other care givers. >50% of the time was devoted counseling the patient about the risks and benefits of treatment/Discharge disposition and coordinating care.   SIGNED:   Miguel Rota, MD  [facility] 01/10/2024, 11:52 AM   If 7PM-7AM, please contact night-coverage

## 2024-01-10 NOTE — Progress Notes (Signed)
 AVS reviewed w/ pt who verbalized an understanding, No other questions at this time. Central tele called by this RN to notify them of pt's d/c- pt removed from tele. PIV removed as noted. Pt dressing for d/c to home. Pt to lobby via w/c - home w/ daughter.

## 2024-01-10 NOTE — Progress Notes (Signed)
 TOC meds in a secure bag delivered to pt in room by this RN- Pt was not due for any pain med refills - will follow up at next PCP appt- pt verbalized an understanding

## 2024-01-10 NOTE — Hospital Course (Addendum)
 Brief Narrative:  63 year old with history of Waldenstrm's macroglobulinemia, nonischemic CM EF 25% grade 1 DD, HTN, anxiety, chronic pain comes to the ER with shortness of breath.  For Waldenstrm's macroglobulinemia follows Dr. Leonides Schanz and is on ibrutinib and rituximab.  Upon admission diagnosed with CHF exacerbation but also developed pleuritic chest pain concerning for pericarditis.  Patient is unable to tolerate cardiac MRI.  Cardiology team has been following.  Limited echocardiogram done which showed some pericardial thickening.  Cardiology recommending discharging him on colchicine with outpatient follow-up.  Medically stable for discharge today.   Assessment & Plan:  Principal Problem:   Acute CHF (congestive heart failure) (HCC) Active Problems:   Benign essential hypertension   Pleuritic chest pain   Waldenstrom macroglobulinemia   Acute on chronic combined systolic and diastolic CHF (congestive heart failure) (HCC)   ARF (acute renal failure) (HCC)   Elevated LFTs   Chest pain of uncertain etiology   NICM (nonischemic cardiomyopathy) (HCC)   Acute chest pain, likely pericarditis/pleuritic - This most likely appears to be pericarditis.  Currently on colchicine which is helping the patient.  Unable to tolerate MRI therefore echocardiogram obtained showing some pericardial thickening concerning for pericarditis.  Will be discharged on 3 months of colchicine and aspirin.  While on full dose aspirin, have added Pepcid as well.  Outpatient cardiology follow-up will be arranged by their service - Cardiology following  Acute congestive heart failure with reduced EF, EF 25% -Overall approaching euvolemia.  Upon discharge patient will be on Lasix 40 mg as needed. -GDMT-Coreg, eplerenone, Marcelline Deist, Entresto  Recurrent tachycardia, SVT bursts -Heart rate is better controlled  Leukocytosis -Resolved  Acute renal failure -Baseline creatinine 0.8, peaked at 1.33.  Now back to  baseline.  Waldenstrm's macroglobulinemia -Follows outpatient oncology Dr. Leonides Schanz on ibrutinib and rituximab.  Discussed with Dr. Leonides Schanz and confirmed that ibrutinib is not known to cause it pericarditis but can cause arrhythmia.  Will continue these and have him discuss outpatient  Chronic pain -Continue methadone  Anxiety/depression -On as needed Valium.  Cymbalta   DVT prophylaxis: enoxaparin (LOVENOX) injection 40 mg Start: 01/04/24 1000    Code Status: Full Code Family Communication:   Discharge today    Subjective:  Feels okay no complaints  Examination:  General exam: Appears calm and comfortable  Respiratory system: Clear to auscultation. Respiratory effort normal. Cardiovascular system: S1 & S2 heard, RRR. No JVD, murmurs, rubs, gallops or clicks. No pedal edema. Gastrointestinal system: Abdomen is nondistended, soft and nontender. No organomegaly or masses felt. Normal bowel sounds heard. Central nervous system: Alert and oriented. No focal neurological deficits. Extremities: Symmetric 5 x 5 power. Skin: No rashes, lesions or ulcers Psychiatry: Judgement and insight appear normal. Mood & affect appropriate.

## 2024-01-11 ENCOUNTER — Other Ambulatory Visit (HOSPITAL_COMMUNITY): Payer: Self-pay

## 2024-01-11 ENCOUNTER — Telehealth: Payer: Self-pay

## 2024-01-11 ENCOUNTER — Other Ambulatory Visit: Payer: Self-pay | Admitting: Nurse Practitioner

## 2024-01-11 ENCOUNTER — Other Ambulatory Visit: Payer: Self-pay

## 2024-01-11 DIAGNOSIS — C8800 Waldenstrom macroglobulinemia not having achieved remission: Secondary | ICD-10-CM

## 2024-01-11 DIAGNOSIS — G893 Neoplasm related pain (acute) (chronic): Secondary | ICD-10-CM

## 2024-01-11 DIAGNOSIS — Z515 Encounter for palliative care: Secondary | ICD-10-CM

## 2024-01-11 MED ORDER — OXYCODONE HCL 10 MG PO TABS
10.0000 mg | ORAL_TABLET | ORAL | 0 refills | Status: DC | PRN
Start: 2024-01-16 — End: 2024-01-30
  Filled 2024-01-16: qty 90, 15d supply, fill #0

## 2024-01-11 MED ORDER — OXYCODONE HCL 10 MG PO TABS
10.0000 mg | ORAL_TABLET | ORAL | 0 refills | Status: DC | PRN
  Filled 2024-01-11: qty 90, 15d supply, fill #0

## 2024-01-11 MED ORDER — CARVEDILOL 3.125 MG PO TABS
3.1250 mg | ORAL_TABLET | Freq: Two times a day (BID) | ORAL | 0 refills | Status: DC
  Filled 2024-01-11: qty 60, 30d supply, fill #0

## 2024-01-11 NOTE — Transitions of Care (Post Inpatient/ED Visit) (Signed)
   01/11/2024  Name: KELLER BOUNDS MRN: [id number] DOB: Mar 05, 1961  Today's TOC FU Call Status: Today's TOC FU Call Status:: Unsuccessful Call (1st Attempt) Unsuccessful Call (1st Attempt) Date: 01/11/24  Attempted to reach the patient regarding the most recent Inpatient/ED visit.Patient was called in an Outreach attempt to offer [facility]BC[facility]  30-day TOC program. Pt is eligible for program due to potential risk for readmission and/or high utilization. Unfortunately, I was not able to speak with the patient in regards to recent hospital discharge  Unable to leave a message patient's voice mailbox was full   Follow Up Plan: Additional outreach attempts will be made to reach the patient to complete the Transitions of Care (Post Inpatient/ED visit) call.   Susa Loffler , BSN, RN [facility]   VBC[facility]-Population Health RN Care Manager Direct Dial [phone]  Fax: [phone] Website: [email or web address]

## 2024-01-12 ENCOUNTER — Telehealth: Payer: Self-pay | Admitting: Hematology and Oncology

## 2024-01-12 ENCOUNTER — Telehealth: Payer: Self-pay

## 2024-01-12 NOTE — Transitions of Care (Post Inpatient/ED Visit) (Signed)
   01/12/2024  Name: Connor Solomon MRN: [id number] DOB: 23-Oct-1960  Today's TOC FU Call Status: Today's TOC FU Call Status:: Unsuccessful Call (2nd Attempt) Unsuccessful Call (2nd Attempt) Date: 01/12/24  Attempted to reach the patient regarding the most recent Inpatient/ED visit. Unable to leave a message per DPR.   Per DPR in EPIC: Patient authorizes ONLY [facility] to leave detailed messages on cell phone voice mail, [phone]   Follow Up Plan: Additional outreach attempts will be made to reach the patient to complete the Transitions of Care (Post Inpatient/ED visit) call.    Gabriel Cirri MSN, RN RN [facility] [employer] [facility]  [facility]BCI-Population Health Office Hours Wed/Thur  8:00 am-6:00 pm Direct Dial: [phone] Main Phone [phone]  Fax: [phone] [email or web address]

## 2024-01-13 ENCOUNTER — Telehealth: Payer: Self-pay

## 2024-01-13 LAB — CULTURE, BLOOD (ROUTINE X 2): Special Requests: ADEQUATE

## 2024-01-13 NOTE — Transitions of Care (Post Inpatient/ED Visit) (Signed)
   01/13/2024  Name: PREM COYKENDALL MRN: [id number] DOB: 1961-07-07  Today's TOC FU Call Status: Today's TOC FU Call Status:: Unsuccessful Call (3rd Attempt) Unsuccessful Call (3rd Attempt) Date: 01/13/24  Attempted to reach the patient regarding the most recent Inpatient/ED visit.  Follow Up Plan: No further outreach attempts will be made at this time. We have been unable to contact the patient.  Bary Leriche RN, MSN [facility], [facility] RN Care Manager Direct Dial: [phone]  Fax: [phone] Website: [email or web address]

## 2024-01-16 ENCOUNTER — Telehealth (HOSPITAL_COMMUNITY): Payer: Self-pay

## 2024-01-16 ENCOUNTER — Other Ambulatory Visit (HOSPITAL_COMMUNITY): Payer: Self-pay

## 2024-01-16 NOTE — Telephone Encounter (Signed)
 Called to confirm/remind patient of their appointment at [facility] on 01/17/2024 1:30.   Appointment:   [] Confirmed  [] Left mess   [] No answer/No voice mail  [] Phone not in service  Patient reminded to bring all medications and/or complete list.  Confirmed patient has transportation. Gave directions, instructed to utilize valet parking.

## 2024-01-17 ENCOUNTER — Encounter (HOSPITAL_COMMUNITY): Payer: Medicare (Managed Care)

## 2024-01-19 ENCOUNTER — Inpatient Hospital Stay: Payer: Medicare (Managed Care) | Admitting: Hematology and Oncology

## 2024-01-19 ENCOUNTER — Inpatient Hospital Stay: Payer: Medicare (Managed Care) | Admitting: Nurse Practitioner

## 2024-01-19 ENCOUNTER — Inpatient Hospital Stay: Payer: Medicare (Managed Care) | Attending: Hematology and Oncology

## 2024-01-19 ENCOUNTER — Other Ambulatory Visit: Payer: Self-pay | Admitting: Nurse Practitioner

## 2024-01-19 ENCOUNTER — Other Ambulatory Visit: Payer: Self-pay

## 2024-01-19 DIAGNOSIS — Z5986 Financial insecurity: Secondary | ICD-10-CM | POA: Insufficient documentation

## 2024-01-19 DIAGNOSIS — Z885 Allergy status to narcotic agent status: Secondary | ICD-10-CM | POA: Insufficient documentation

## 2024-01-19 DIAGNOSIS — I110 Hypertensive heart disease with heart failure: Secondary | ICD-10-CM | POA: Insufficient documentation

## 2024-01-19 DIAGNOSIS — Z515 Encounter for palliative care: Secondary | ICD-10-CM | POA: Insufficient documentation

## 2024-01-19 DIAGNOSIS — I5022 Chronic systolic (congestive) heart failure: Secondary | ICD-10-CM | POA: Insufficient documentation

## 2024-01-19 DIAGNOSIS — I2699 Other pulmonary embolism without acute cor pulmonale: Secondary | ICD-10-CM | POA: Insufficient documentation

## 2024-01-19 DIAGNOSIS — M47814 Spondylosis without myelopathy or radiculopathy, thoracic region: Secondary | ICD-10-CM | POA: Insufficient documentation

## 2024-01-19 DIAGNOSIS — G893 Neoplasm related pain (acute) (chronic): Secondary | ICD-10-CM

## 2024-01-19 DIAGNOSIS — Z8249 Family history of ischemic heart disease and other diseases of the circulatory system: Secondary | ICD-10-CM | POA: Insufficient documentation

## 2024-01-19 DIAGNOSIS — D472 Monoclonal gammopathy: Secondary | ICD-10-CM | POA: Insufficient documentation

## 2024-01-19 DIAGNOSIS — Z86718 Personal history of other venous thrombosis and embolism: Secondary | ICD-10-CM | POA: Insufficient documentation

## 2024-01-19 DIAGNOSIS — Z79620 Long term (current) use of immunosuppressive biologic: Secondary | ICD-10-CM | POA: Insufficient documentation

## 2024-01-19 DIAGNOSIS — N3289 Other specified disorders of bladder: Secondary | ICD-10-CM | POA: Insufficient documentation

## 2024-01-19 DIAGNOSIS — Z79899 Other long term (current) drug therapy: Secondary | ICD-10-CM | POA: Insufficient documentation

## 2024-01-19 DIAGNOSIS — C8800 Waldenstrom macroglobulinemia not having achieved remission: Secondary | ICD-10-CM

## 2024-01-19 DIAGNOSIS — N132 Hydronephrosis with renal and ureteral calculous obstruction: Secondary | ICD-10-CM | POA: Insufficient documentation

## 2024-01-19 DIAGNOSIS — N179 Acute kidney failure, unspecified: Secondary | ICD-10-CM | POA: Insufficient documentation

## 2024-01-19 DIAGNOSIS — I3139 Other pericardial effusion (noninflammatory): Secondary | ICD-10-CM | POA: Insufficient documentation

## 2024-01-19 DIAGNOSIS — I080 Rheumatic disorders of both mitral and aortic valves: Secondary | ICD-10-CM | POA: Insufficient documentation

## 2024-01-19 DIAGNOSIS — F17290 Nicotine dependence, other tobacco product, uncomplicated: Secondary | ICD-10-CM | POA: Insufficient documentation

## 2024-01-19 DIAGNOSIS — F32A Depression, unspecified: Secondary | ICD-10-CM | POA: Insufficient documentation

## 2024-01-19 DIAGNOSIS — F419 Anxiety disorder, unspecified: Secondary | ICD-10-CM | POA: Insufficient documentation

## 2024-01-19 DIAGNOSIS — I700 Atherosclerosis of aorta: Secondary | ICD-10-CM | POA: Insufficient documentation

## 2024-01-19 DIAGNOSIS — D509 Iron deficiency anemia, unspecified: Secondary | ICD-10-CM | POA: Insufficient documentation

## 2024-01-20 ENCOUNTER — Telehealth: Payer: Self-pay | Admitting: Hematology and Oncology

## 2024-01-23 ENCOUNTER — Telehealth: Payer: Self-pay | Admitting: Hematology and Oncology

## 2024-01-24 ENCOUNTER — Other Ambulatory Visit: Payer: Self-pay | Admitting: Nurse Practitioner

## 2024-01-24 ENCOUNTER — Other Ambulatory Visit (HOSPITAL_COMMUNITY): Payer: Self-pay

## 2024-01-24 DIAGNOSIS — G893 Neoplasm related pain (acute) (chronic): Secondary | ICD-10-CM

## 2024-01-24 DIAGNOSIS — Z515 Encounter for palliative care: Secondary | ICD-10-CM

## 2024-01-24 DIAGNOSIS — C8800 Waldenstrom macroglobulinemia not having achieved remission: Secondary | ICD-10-CM

## 2024-01-26 ENCOUNTER — Telehealth: Payer: Self-pay | Admitting: Hematology and Oncology

## 2024-01-27 ENCOUNTER — Other Ambulatory Visit: Payer: Self-pay | Admitting: Hematology and Oncology

## 2024-01-27 ENCOUNTER — Other Ambulatory Visit: Payer: Self-pay

## 2024-01-27 ENCOUNTER — Inpatient Hospital Stay: Payer: Medicare (Managed Care) | Admitting: Hematology and Oncology

## 2024-01-27 ENCOUNTER — Telehealth: Payer: Self-pay | Admitting: Hematology and Oncology

## 2024-01-27 ENCOUNTER — Inpatient Hospital Stay: Payer: Medicare (Managed Care)

## 2024-01-27 DIAGNOSIS — C8800 Waldenstrom macroglobulinemia not having achieved remission: Secondary | ICD-10-CM

## 2024-01-27 DIAGNOSIS — Z515 Encounter for palliative care: Secondary | ICD-10-CM

## 2024-01-27 DIAGNOSIS — G893 Neoplasm related pain (acute) (chronic): Secondary | ICD-10-CM

## 2024-01-27 NOTE — Progress Notes (Deleted)
 [facility] [phone]   [phone]  PROGRESS NOTE  Patient Care Team: Aurora Lees, DO as PCP - General Bensimhon, Rheta Celestine, MD as PCP - Cardiology (Cardiology) Pickenpack-Cousar, Giles Labrum, NP as Nurse Practitioner (Nurse Practitioner)  Hematological/Oncological History # Waldenstrom's Macroglobulinemia # IgM Monoclonal Gammopathy 04/07/2021: CT A/p showed mildly enlarged retroperitoneal lymph nodes and bilateral inguinal lymph nodes.  04/08/2021: IgM Kappa M protein 1.2 04/09/2021: WBC 11.1, Hgb 9.5, MCV 75.4, Plt 525 04/20/2021: Kappa 977, Lambda 7.8, K/L ratio 125.28. Serum viscosity 1.8 05/06/2021: Establish care with Dr. 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 Infusion reaction with ritux, held halfway through.  06/12/2021: Cycle 2 Day 1 of Ibrutinib  + Rituximab  06/19/2021: Cycle 3 Day 1 of Ibrutinib  + Rituximab  06/26/2021: Cycle 4 Day 1 of rituximab . Continued on daily ibrutinib  12/02/2021: Cycle 5 Day 1 of Ibrutinib  + Rituximab  12/09/2021: Cycle 6 Day 1 of Ibrutinib  + Rituximab  12/16/2021: Cycle 7 Day 1 of Ibrutinib  + Rituximab  12/23/2021: Held Cycle 8 of Ritxumab due to nausea/vomiting, elevated creatinine, finger infection  Interval History:  Connor Solomon 63 y.o. male with medical history significant for Waldenstrom's macroglobulinemia who presents for a follow up visit. The patient's last visit was on 10/10/2023. In the interim since the last visit he has continued on Ibrutinib  therapy.   On exam today Mr. Homes reports he has unfortunately developed a head cold.  He is having coughing, increasing phlegm, and headache.  He reports that he is however getting over it.  He reports his pain is under good control and currently at a 5 out of 10.  He reports his  energy levels have been "not good".  He reports that he does have some occasional trouble walking longer distances because becomes short of breath.  He reports recently had an episode became very short of breath and was having palpitations.  He was quite frightened because of this but it did pass.  He reports he has not had any trouble with bleeding, bruising, or dark stools.  His appetite remains good and he is doing his best to stay hydrated.  Unfortunately his wife was in the hospital for about 5 weeks and is now back at home, attempting to regain her strength..   He is willing and able to proceed with ibrutinib  therapy. He denies fevers, chills, night sweats, shortness of breath, chest pain or cough. He has no other complaints. Full 10 point ROS is listed below.   MEDICAL HISTORY:  Past Medical History:  Diagnosis Date   Acute heart failure (HCC) 04/08/2021   Acute HFrEF (heart failure with reduced ejection fraction) (HCC) 04/07/2021   Arthritis    hands, elbows bilaterally   Cancer (HCC)    Full dentures    Hypertension    Pt states he doen not have HTN and has never been treated for HTN   Kidney stone on left side last stone 4-5 yrs ago   recurrent (3 episodes)   Panic disorder    pt states has resolved   Syncope    resolved- was related to panic attacks    SURGICAL HISTORY: Past Surgical History:  Procedure Laterality Date   CARPAL METACARPAL FUSION WITH DISTAL RADIAL BONE GRAFT Left 05/30/2013   Procedure: LEFT THUMB METACARPAL JOINT FUSION;  Surgeon: Hedy Living, MD;  Location: [facility];  Service: Orthopedics;  Laterality: Left;   ESOPHAGOGASTRODUODENOSCOPY N/A 02/12/2014   Procedure: ESOPHAGOGASTRODUODENOSCOPY (EGD);  Surgeon: Nannette Babe, MD;  Location: [facility] ENDOSCOPY;  Service: Endoscopy;  Laterality: N/A;   FOREIGN BODY REMOVAL N/A 02/12/2014   Procedure: FOREIGN BODY REMOVAL;  Surgeon: Nannette Babe, MD;  Location: [facility] ENDOSCOPY;  Service: Endoscopy;   Laterality: N/A;   OPEN REDUCTION INTERNAL FIXATION (ORIF) DISTAL RADIAL FRACTURE Left 11/29/2012   Procedure: OPEN REDUCTION INTERNAL FIXATION (ORIF) DISTAL RADIAL FRACTURE;  Surgeon: Hedy Living, MD;  Location: [facility];  Service: Orthopedics;  Laterality: Left;  LEFT DISTAL RADIUS OSTEOTOMY WITH BONE GRAFT   RIGHT/LEFT HEART CATH AND CORONARY ANGIOGRAPHY N/A 04/09/2021   Procedure: RIGHT/LEFT HEART CATH AND CORONARY ANGIOGRAPHY;  Surgeon: Mardell Shade, MD;  Location: [facility];  Service: Cardiovascular;  Laterality: N/A;   TENDON REPAIR Left 02/07/2013   Procedure: LEFT EXTENSOR INDICIS PROPRIUS  TO EXTENSOR POLLICIS LONGUS TENDON TRANSFER;  Surgeon: Hedy Living, MD;  Location: [facility];  Service: Orthopedics;  Laterality: Left;   WISDOM TOOTH EXTRACTION     WRIST SURGERY     fx only    SOCIAL HISTORY: Social History   Socioeconomic History   Marital status: Single    Spouse name: Not on file   Number of children: Not on file   Years of education: Not on file   Highest education level: Not on file  Occupational History   Not on file  Tobacco Use   Smoking status: Former    Types: Cigars    Quit date: 10/11/2020    Years since quitting: 3.2   Smokeless tobacco: Never   Tobacco comments:    smokes 1 cigar a week  Vaping Use   Vaping status: Never Used  Substance and Sexual Activity   Alcohol use: No   Drug use: No   Sexual activity: Not on file  Other Topics Concern   Not on file  Social History Narrative   Not on file   Social Drivers of Health   Financial Resource Strain: High Risk (07/17/2021)   Overall Financial Resource Strain (CARDIA)    Difficulty of Paying Living Expenses: Very hard  Food Insecurity: No Food Insecurity (01/04/2024)   Hunger Vital Sign    Worried About Running Out of Food in the Last Year: Never true    Ran Out of Food in the Last Year: Never true  Transportation Needs: No  Transportation Needs (01/04/2024)   PRAPARE - Administrator, Civil Service (Medical): No    Lack of Transportation (Non-Medical): No  Physical Activity: Not on file  Stress: Stress Concern Present (09/01/2021)   [employer] of Occupational Health - Occupational Stress Questionnaire    Feeling of Stress : Very much  Social Connections: Not on file  Intimate Partner Violence: Not At Risk (01/04/2024)   Humiliation, Afraid, Rape, and Kick questionnaire    Fear of Current or Ex-Partner: No    Emotionally Abused: No    Physically Abused: No    Sexually Abused: No    FAMILY HISTORY: Family History  Problem Relation Age of Onset   Heart attack Father    Sudden Cardiac Death Father 81   Diabetes Neg Hx    Hyperlipidemia Neg Hx    Hypertension Neg Hx     ALLERGIES:  is allergic to rituxan  [rituximab ] and hydrocodone .  MEDICATIONS:  Current Outpatient Medications  Medication Sig Dispense Refill  acetaminophen  (TYLENOL ) 500 MG tablet Take 500 mg by mouth every 6 (six) hours as needed.     albuterol  (PROAIR  HFA) 108 (90 Base) MCG/ACT inhaler Inhale 2 puffs into the lungs every 6 (six) hours as needed for wheezing or shortness of breath. 6.7 g 2   aspirin  EC 325 MG tablet Take 1 tablet (325 mg total) by mouth daily. 30 tablet 0   carvedilol  (COREG ) 3.125 MG tablet Take 1 tablet (3.125 mg total) by mouth 2 (two) times daily. NEEDS FOLLOW UP APPOINTMENT FOR MORE REFILLS 60 tablet 0   chlorhexidine  (HIBICLENS ) 4 % external liquid Apply topically daily as needed. Dilute with water and clean wound with this.  Rinse thoroughly afterwards (Patient not taking: Reported on 01/04/2024) 237 mL 0   clobetasol  cream (TEMOVATE ) 0.05 % Apply 1 Application topically 2 (two) times daily. Apply topically to the arms and hands twice daily for up to two weeks 30 g 2   colchicine  0.6 MG tablet Take 1 tablet (0.6 mg total) by mouth 2 (two) times daily. 180 tablet 0   dapagliflozin  propanediol  (FARXIGA ) 10 MG TABS tablet Take 1 tablet (10 mg total) by mouth daily before breakfast. NEEDS FOLLOW UP APPOINTMENT FOR ANYMORE REFILLS 30 tablet 3   diazepam  (VALIUM ) 5 MG tablet Take 1 tablet (5 mg total) by mouth every 12 (twelve) hours as needed for anxiety. 60 tablet 0   DULoxetine  (CYMBALTA ) 30 MG capsule Take 1 capsule (30 mg total) by mouth daily. 30 capsule 0   eplerenone  (INSPRA ) 25 MG tablet Take 0.5 tablets (12.5 mg total) by mouth daily. NEEDS FOLLOW UP APPOINTMENT FOR MORE REFILLS 15 tablet 0   famotidine  (PEPCID ) 20 MG tablet Take 1 tablet (20 mg total) by mouth at bedtime. 90 tablet 0   ferrous sulfate  (FEROSUL) 325 (65 FE) MG tablet Take 1 tablet by mouth daily with breakfast. Please take with a source of Vitamin C 90 tablet 3   furosemide  (LASIX ) 40 MG tablet Take 1 tablet (40 mg total) by mouth daily as needed for fluid or edema. 30 tablet 0   ibrutinib  (IMBRUVICA ) 420 MG tablet Take 1 tablet (420 mg total) by mouth daily. 28 tablet 2   methadone  (DOLOPHINE ) 10 MG tablet Take 1 tablet (10 mg total) by mouth every 8 (eight) hours. 90 tablet 0   naloxone  (NARCAN ) nasal spray 4 mg/0.1 mL Take for overdose 1 each 0   naloxone  (NARCAN ) nasal spray 4 mg/0.1 mL Take for overdose as directed 2 each 0   ondansetron  (ZOFRAN ) 8 MG tablet Take 1 tablet (8 mg) by mouth every 8 hours as needed. 30 tablet 0   Oxycodone  HCl 10 MG TABS Take 1 tablet (10 mg total) by mouth every 4 (four) hours as needed. 90 tablet 0   sacubitril -valsartan  (ENTRESTO ) 24-26 MG Take 1 tablet by mouth 2 (two) times daily. 60 tablet 0   sildenafil  (VIAGRA ) 50 MG tablet Take 1 tablet (50 mg total) by mouth daily as needed for erectile dysfunction. 20 tablet 0   No current facility-administered medications for this visit.    REVIEW OF SYSTEMS:   Constitutional: ( - ) fevers, ( - )  chills , ( - ) night sweats Eyes: ( - ) blurriness of vision, ( - ) double vision, ( - ) watery eyes Ears, nose, mouth, throat, and  face: ( - ) mucositis, ( - ) sore throat Respiratory: ( - ) cough, ( - ) dyspnea, ( - ) wheezes  Cardiovascular: ( - ) palpitation, ( - ) chest discomfort, ( - ) lower extremity swelling Gastrointestinal:  (- ) nausea, ( - ) heartburn, ( - ) change in bowel habits Skin: ( - ) abnormal skin rashes Lymphatics: ( - ) new lymphadenopathy, ( - ) easy bruising Neurological: ( - ) numbness, ( - ) tingling, ( - ) new weaknesses Behavioral/Psych: ( - ) mood change, ( - ) new changes  All other systems were reviewed with the patient and are negative.  PHYSICAL EXAMINATION: ECOG PERFORMANCE STATUS: 2 - Symptomatic, <50% confined to bed  There were no vitals filed for this visit.    There were no vitals filed for this visit.     GENERAL: Chronically ill appearing middle-aged Caucasian male, alert, no distress and comfortable SKIN: Dry cracking rash of hands bilaterally.  No issues on the chest, face, or lower extremities.  Skin color, texture, turgor are normal, no rashes or significant lesions.  EYES: conjunctiva are pink and non-injected, sclera clear LUNGS: clear to auscultation and percussion with normal breathing effort HEART: regular rate & rhythm and no murmurs and no lower extremity edema  MSK: Bilateral edema of hands.  PSYCH: alert & oriented x 3, fluent speech NEURO: no focal motor/sensory deficits   LABORATORY DATA:  I have reviewed the data as listed    Latest Ref Rng & Units 01/10/2024    4:02 AM 01/09/2024    3:19 AM 01/08/2024    3:40 AM  CBC  WBC 4.0 - 10.5 K/uL 10.4  17.2  18.7   Hemoglobin 13.0 - 17.0 g/dL 65.7  84.6  96.2   Hematocrit 39.0 - 52.0 % 36.1  39.8  41.1   Platelets 150 - 400 K/uL 223  250  270        Latest Ref Rng & Units 01/10/2024    4:02 AM 01/09/2024    3:19 AM 01/08/2024    3:40 AM  CMP  Glucose 70 - 99 mg/dL 88  97  85   BUN 8 - 23 mg/dL 20  17  19    Creatinine 0.61 - 1.24 mg/dL 9.52  8.41  3.24   Sodium 135 - 145 mmol/L 132  132  133    Potassium 3.5 - 5.1 mmol/L 3.8  4.5  5.3   Chloride 98 - 111 mmol/L 99  100  101   CO2 22 - 32 mmol/L 23  23  24    Calcium 8.9 - 10.3 mg/dL 8.5  8.7  8.8   Total Protein 6.5 - 8.1 g/dL   6.0   Total Bilirubin 0.0 - 1.2 mg/dL   1.2   Alkaline Phos 38 - 126 U/L   81   AST 15 - 41 U/L   11   ALT 0 - 44 U/L   19     Lab Results  Component Value Date   MPROTEIN 0.1 (H) 12/09/2023   MPROTEIN 0.1 (H) 11/11/2023   MPROTEIN 0.1 (H) 09/12/2023   Lab Results  Component Value Date   KPAFRELGTCHN 24.5 (H) 12/09/2023   KPAFRELGTCHN 26.1 (H) 11/11/2023   KPAFRELGTCHN 29.3 (H) 09/12/2023   LAMBDASER 8.1 12/09/2023   LAMBDASER 8.9 11/11/2023   LAMBDASER 7.1 09/12/2023   KAPLAMBRATIO 3.02 (H) 12/09/2023   KAPLAMBRATIO 2.93 (H) 11/11/2023   KAPLAMBRATIO 4.13 (H) 09/12/2023    RADIOGRAPHIC STUDIES: ECHOCARDIOGRAM LIMITED Result Date: 01/09/2024    ECHOCARDIOGRAM LIMITED REPORT   Patient Name:   SARON TWEED Date of Exam:  01/09/2024 Medical Rec #:  [id number]     Height:       68.0 in Accession #:    [id number]    Weight:       120.8 lb Date of Birth:  1960/12/19     BSA:          1.650 m Patient Age:    62 years      BP:           91/69 mmHg Patient Gender: M             HR:           70 bpm. Exam Location:  Inpatient Procedure: Limited Echo (Both Spectral and Color Flow Doppler were utilized            during procedure). Indications:    I31.3 Pericardial effusion (noninflammatory)  History:        Patient has prior history of Echocardiogram examinations, most                 recent 01/04/2024. CHF and Cardiomyopathy, Mitral Valve Disease,                 Signs/Symptoms:Chest Pain and Dyspnea; Risk                 Factors:Hypertension. Pericardial effusion.  Sonographer:    Raynelle Callow RDCS Referring Phys: [id number] Ethelle Herb A ACHARYA IMPRESSIONS  1. There is no left ventricular thrombus. Left ventricular ejection fraction, by estimation, is <20%. The left ventricle has severely decreased function. The left  ventricle demonstrates global hypokinesis. The left ventricular internal cavity size was moderately dilated. Left ventricular diastolic parameters are consistent with Grade III diastolic dysfunction (restrictive).  2. Right ventricular systolic function is normal. The right ventricular size is normal.  3. The pericardium is circumferentially thickened. There is no respiratory flow variation across the mitral or tricuspid valve to support a diagnosis of constrictive pericarditis.  4. The mitral valve is normal in structure. No evidence of mitral valve regurgitation. No evidence of mitral stenosis.  5. The aortic valve is normal in structure. Aortic valve regurgitation is not visualized. No aortic stenosis is present.  6. The inferior vena cava is dilated in size with <50% respiratory variability, suggesting right atrial pressure of 15 mmHg. Comparison(s): Prior images reviewed side by side. There was a trivial amount of anterior pericardial effusion on the previous study, which has now resolved. Otherwise no change. FINDINGS  Left Ventricle: There is no left ventricular thrombus. Left ventricular ejection fraction, by estimation, is <20%. The left ventricle has severely decreased function. The left ventricle demonstrates global hypokinesis. The left ventricular internal cavity size was moderately dilated. There is no left ventricular hypertrophy. Left ventricular diastolic parameters are consistent with Grade III diastolic dysfunction (restrictive). Right Ventricle: The right ventricular size is normal. No increase in right ventricular wall thickness. Right ventricular systolic function is normal. Left Atrium: Left atrial size was normal in size. Right Atrium: Right atrial size was normal in size. Pericardium: The pericardium is circumferentially thickened. There is no respiratory flow variation across the mitral or tricuspid valve to support a diagnosis of constrictive pericarditis. There is no evidence of  pericardial effusion. Mitral Valve: The mitral valve is normal in structure. No evidence of mitral valve stenosis. Tricuspid Valve: The tricuspid valve is normal in structure. Tricuspid valve regurgitation is not demonstrated. No evidence of tricuspid stenosis. Aortic Valve: The aortic valve is normal in structure. Aortic valve regurgitation is  not visualized. No aortic stenosis is present. Pulmonic Valve: The pulmonic valve was normal in structure. Pulmonic valve regurgitation is not visualized. No evidence of pulmonic stenosis. Aorta: The aortic root is normal in size and structure. Venous: The inferior vena cava is dilated in size with less than 50% respiratory variability, suggesting right atrial pressure of 15 mmHg. IAS/Shunts: No atrial level shunt detected by color flow Doppler. IVC IVC diam: 2.20 cm Mihai Croitoru MD Electronically signed by Luana Rumple MD Signature Date/Time: 01/09/2024/2:29:37 PM    Final    CT HEAD WO CONTRAST ( ) Result Date: 01/07/2024 CLINICAL DATA:  Mental status change, unknown cause EXAM: CT HEAD WITHOUT CONTRAST TECHNIQUE: Contiguous axial images were obtained from the base of the skull through the vertex without intravenous contrast. RADIATION DOSE REDUCTION: This exam was performed according to the departmental dose-optimization program which includes automated exposure control, adjustment of the mA and/or kV according to patient size and/or use of iterative reconstruction technique. COMPARISON:  CT head 05/31/2020 FINDINGS: Brain: No evidence of acute infarction, hemorrhage, hydrocephalus, extra-axial collection or mass lesion/mass effect. Vascular: Calcific atherosclerosis. Skull: No acute fracture. Sinuses/Orbits: Mostly clear sinuses.  No acute orbital findings. Other: No mastoid effusions. IMPRESSION: No evidence of acute intracranial abnormality. Electronically Signed   By: Stevenson Elbe M.D.   On: 01/07/2024 20:15   DG Chest Port 1 View Result Date:  01/07/2024 CLINICAL DATA:  Chest pain. EXAM: PORTABLE CHEST 1 VIEW COMPARISON:  Chest radiographs 01/04/2024 and 07/23/2022. Chest CT 01/04/2024. FINDINGS: 1023 hours. Stable cardiomegaly and aortic atherosclerosis. Previously demonstrated bilateral pleural effusions and bibasilar pulmonary opacities have improved. There is residual mild bibasilar atelectasis. No pneumothorax or definite edema. Skin folds overlie the chest bilaterally. There is an old healed fracture of the right humeral neck. No acute osseous findings are seen. IMPRESSION: Interval improvement in previously demonstrated bilateral pleural effusions and bibasilar pulmonary opacities. No acute cardiopulmonary process identified. Electronically Signed   By: Elmon Hagedorn M.D.   On: 01/07/2024 10:46   VAS US  LOWER EXTREMITY VENOUS (DVT) Result Date: 01/04/2024  Lower Venous DVT Study Patient Name:  DEAUNTE DENTE  Date of Exam:   01/04/2024 Medical Rec #: [id number]      Accession #:    [id number] Date of Birth: 1961-06-18      Patient Gender: M Patient Age:   68 years Exam Location:  [facility] Procedure:      VAS US  LOWER EXTREMITY VENOUS (DVT) Referring Phys: Melford Spotted --------------------------------------------------------------------------------  Indications: Edema.  Risk Factors: DVT Hx. Anticoagulation: Lovenox . Comparison Study: None. Performing Technologist: Estanislao Heimlich  Examination Guidelines: A complete evaluation includes B-mode imaging, spectral Doppler, color Doppler, and power Doppler as needed of all accessible portions of each vessel. Bilateral testing is considered an integral part of a complete examination. Limited examinations for reoccurring indications may be performed as noted. The reflux portion of the exam is performed with the patient in reverse Trendelenburg.  +---------+---------------+---------+-----------+----------+--------------+ RIGHT    CompressibilityPhasicitySpontaneityPropertiesThrombus  Aging +---------+---------------+---------+-----------+----------+--------------+ CFV      Full           Yes      Yes                                 +---------+---------------+---------+-----------+----------+--------------+ SFJ      Full                                                        +---------+---------------+---------+-----------+----------+--------------+  FV Prox  Full                                                        +---------+---------------+---------+-----------+----------+--------------+ FV Mid   Full                                                        +---------+---------------+---------+-----------+----------+--------------+ FV DistalFull                                                        +---------+---------------+---------+-----------+----------+--------------+ PFV      Full                                                        +---------+---------------+---------+-----------+----------+--------------+ POP      Full           Yes      Yes                                 +---------+---------------+---------+-----------+----------+--------------+ PTV      Full                                                        +---------+---------------+---------+-----------+----------+--------------+ PERO     Full                                                        +---------+---------------+---------+-----------+----------+--------------+   +---------+---------------+---------+-----------+----------+--------------+ LEFT     CompressibilityPhasicitySpontaneityPropertiesThrombus Aging +---------+---------------+---------+-----------+----------+--------------+ CFV      Full           Yes      Yes                                 +---------+---------------+---------+-----------+----------+--------------+ SFJ      Full                                                         +---------+---------------+---------+-----------+----------+--------------+ FV Prox  Full                                                        +---------+---------------+---------+-----------+----------+--------------+  FV Mid   Full                                                        +---------+---------------+---------+-----------+----------+--------------+ FV DistalFull                                                        +---------+---------------+---------+-----------+----------+--------------+ PFV      Full                                                        +---------+---------------+---------+-----------+----------+--------------+ POP      Full           Yes      Yes                                 +---------+---------------+---------+-----------+----------+--------------+ PTV      Full                                                        +---------+---------------+---------+-----------+----------+--------------+ PERO     Full                                                        +---------+---------------+---------+-----------+----------+--------------+     Summary: BILATERAL: - No evidence of deep vein thrombosis seen in the lower extremities, bilaterally. -No evidence of popliteal cyst, bilaterally.   *See table(s) above for measurements and observations. Electronically signed by Genny Kid MD on 01/04/2024 at 10:04:37 PM.    Final    CT Angio Chest Pulmonary Embolism (PE) W or WO Contrast Result Date: 01/04/2024 CLINICAL DATA:  Dyspnea with elevated D-dimer EXAM: CT ANGIOGRAPHY CHEST WITH CONTRAST TECHNIQUE: Multidetector CT imaging of the chest was performed using the standard protocol during bolus administration of intravenous contrast. Multiplanar CT image reconstructions and MIPs were obtained to evaluate the vascular anatomy. RADIATION DOSE REDUCTION: This exam was performed according to the departmental dose-optimization program  which includes automated exposure control, adjustment of the mA and/or kV according to patient size and/or use of iterative reconstruction technique. CONTRAST:  75mL OMNIPAQUE  IOHEXOL  350 MG/ML SOLN COMPARISON:  05/12/2021 FINDINGS: Cardiovascular: Atherosclerotic calcifications of the thoracic aorta are noted. Mild dilatation of the ascending aorta to 4 cm is noted. The pulmonary artery shows a normal branching pattern bilaterally. No intraluminal filling defect to suggest pulmonary embolism is noted. Mild coronary calcifications are seen. The heart is enlarged in size. Minimal pericardial effusion is noted. Mediastinum/Nodes: Thoracic inlet is within normal limits. No hilar or mediastinal adenopathy is noted. The esophagus as visualized is within normal limits. Lungs/Pleura: Lungs  are well aerated bilaterally. Bilateral moderate to large pleural effusions are noted with some compressive atelectasis in the bases. Scattered ground-glass opacities are noted in the upper lobes on the left on image number 32 of series 13 and on the right on image number 64 of series 13. These were not present on the prior exam enter likely inflammatory in nature. The right-sided one measures up to 8 mm. Upper Abdomen: Visualized upper abdomen is within normal limits. Musculoskeletal: Degenerative changes of the thoracic spine are noted. No acute bony abnormality is noted. Review of the MIP images confirms the above findings. IMPRESSION: No evidence of pulmonary emboli. 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 Recommend annual imaging followup by CTA or MRA. This recommendation follows 2010 ACCF/AHA/AATS/ACR/ASA/SCA/SCAI/SIR/STS/SVM Guidelines for the Diagnosis and Management of Patients with Thoracic Aortic Disease. Circulation. 2010; 121: W295-A213. Aortic aneurysm NOS (ICD10-I71.9) Patchy ground-glass opacity in the upper lobes bilaterally as described. These are likely postinflammatory in nature. Initial follow-up with CT at 6  months is recommended to confirm persistence. If persistent, repeat CT is recommended every 2 years until 5 years of stability has been established. This recommendation follows the consensus statement: Guidelines for Management of Incidental Pulmonary Nodules Detected on CT Images: From the Fleischner Society 2017; Radiology 2017; 284:228-243. Aortic Atherosclerosis (ICD10-I70.0). Electronically Signed   By: Violeta Grey M.D.   On: 01/04/2024 20:03   ECHOCARDIOGRAM COMPLETE Result Date: 01/04/2024    ECHOCARDIOGRAM REPORT   Patient Name:   KORBYN CHOPIN Date of Exam: 01/04/2024 Medical Rec #:  [id number]     Height:       68.0 in Accession #:    [id number]    Weight:       134.0 lb Date of Birth:  03-28-1961     BSA:          1.724 m Patient Age:    62 years      BP:           118/72 mmHg Patient Gender: M             HR:           66 bpm. Exam Location:  Inpatient Procedure: 2D Echo, 3D Echo, Cardiac Doppler, Color Doppler, Strain Analysis and            Intracardiac Opacification Agent (Both Spectral and Color Flow            Doppler were utilized during procedure). Indications:    Congestive Heart Failure  History:        Patient has prior history of Echocardiogram examinations, most                 recent 09/14/2021. Risk Factors:Hypertension and Former Smoker.  Sonographer:    Reta Cassis Referring Phys: [id number] Angelene Kelly  Sonographer Comments: Global longitudinal strain was attempted. IMPRESSIONS  1. Left ventricular ejection fraction, by estimation, is <20%. Left ventricular ejection fraction by 3D volume is 26 %. The left ventricle has severely decreased function. The left ventricle has no regional wall motion abnormalities. The left ventricular internal cavity size was moderately dilated. Left ventricular diastolic parameters are consistent with Grade III diastolic dysfunction (restrictive). The average left ventricular global longitudinal strain is -3.9 %. The global longitudinal strain is abnormal.   2. Right ventricular systolic function is normal. The right ventricular size is normal.  3. Left atrial size was severely dilated.  4. Right atrial size was mildly dilated.  5. The  mitral valve is normal in structure. Mild mitral valve regurgitation. No evidence of mitral stenosis.  6. The aortic valve is normal in structure. Aortic valve regurgitation is trivial. No aortic stenosis is present.  7. The inferior vena cava is normal in size with <50% respiratory variability, suggesting right atrial pressure of 8 mmHg. Comparison(s): Prior images reviewed side by side. FINDINGS  Left Ventricle: Left ventricular ejection fraction, by estimation, is <20%. Left ventricular ejection fraction by 3D volume is 26 %. The left ventricle has severely decreased function. The left ventricle has no regional wall motion abnormalities. Definity  contrast agent was given IV to delineate the left ventricular endocardial borders. The average left ventricular global longitudinal strain is -3.9 %. Strain was performed and the global longitudinal strain is abnormal. 3D ejection fraction reviewed and evaluated as part of the interpretation. Alternate measurement of EF is felt to be most reflective of LV function. The left ventricular internal cavity size was moderately dilated. There is no left ventricular hypertrophy. Left ventricular diastolic parameters are consistent with Grade III diastolic dysfunction (restrictive). Right Ventricle: The right ventricular size is normal. No increase in right ventricular wall thickness. Right ventricular systolic function is normal. Left Atrium: Left atrial size was severely dilated. Right Atrium: Right atrial size was mildly dilated. Pericardium: There is no evidence of pericardial effusion. Mitral Valve: The mitral valve is normal in structure. Mild mitral valve regurgitation. No evidence of mitral valve stenosis. Tricuspid Valve: The tricuspid valve is normal in structure. Tricuspid valve  regurgitation is not demonstrated. No evidence of tricuspid stenosis. Aortic Valve: The aortic valve is normal in structure. Aortic valve regurgitation is trivial. No aortic stenosis is present. Aortic valve mean gradient measures 3.0 mmHg. Aortic valve peak gradient measures 5.2 mmHg. Aortic valve area, by VTI measures 1.84 cm. Pulmonic Valve: The pulmonic valve was normal in structure. Pulmonic valve regurgitation is not visualized. No evidence of pulmonic stenosis. Aorta: The aortic root is normal in size and structure. Venous: The inferior vena cava is normal in size with less than 50% respiratory variability, suggesting right atrial pressure of 8 mmHg. IAS/Shunts: No atrial level shunt detected by color flow Doppler. Additional Comments: 3D was performed not requiring image post processing on an independent workstation and was abnormal.  LEFT VENTRICLE PLAX 2D LVIDd:         6.30 cm         Diastology LVIDs:         6.10 cm         LV e' medial:    3.81 cm/s LV PW:         0.90 cm         LV E/e' medial:  13.3 LV IVS:        0.90 cm         LV e' lateral:   7.07 cm/s LVOT diam:     2.10 cm         LV E/e' lateral: 7.2 LV SV:         31 LV SV Index:   18              2D Longitudinal LVOT Area:     3.46 cm        Strain                                2D Strain GLS   -3.9 %  Avg: LV Volumes (MOD) LV vol d, MOD    244.0 ml      3D Volume EF A2C:                           LV 3D EF:    Left LV vol d, MOD    252.0 ml                   ventricul A4C:                                        ar LV vol s, MOD    193.0 ml                   ejection A2C:                                        fraction LV vol s, MOD    217.0 ml                   by 3D A4C:                                        volume is LV SV MOD A2C:   51.0 ml                    26 %. LV SV MOD A4C:   252.0 ml LV SV MOD BP:    48.3 ml                                3D Volume EF:                                3D EF:        26  %                                LV EDV:       261 ml                                LV ESV:       194 ml                                LV SV:        67 ml RIGHT VENTRICLE            IVC RV Basal diam:  3.50 cm    IVC diam: 1.90 cm RV S prime:     7.72 cm/s TAPSE (M-mode): 1.5 cm LEFT ATRIUM              Index        RIGHT ATRIUM           Index LA diam:        5.40 cm  3.13  cm/m   RA Area:     21.40 cm LA Vol (A2C):   119.0 ml 69.03 ml/m  RA Volume:   64.80 ml  37.59 ml/m LA Vol (A4C):   92.8 ml  53.83 ml/m LA Biplane Vol: 108.0 ml 62.65 ml/m  AORTIC VALVE AV Area (Vmax):    2.11 cm AV Area (Vmean):   1.88 cm AV Area (VTI):     1.84 cm AV Vmax:           114.00 cm/s AV Vmean:          77.400 cm/s AV VTI:            0.167 m AV Peak Grad:      5.2 mmHg AV Mean Grad:      3.0 mmHg LVOT Vmax:         69.40 cm/s LVOT Vmean:        42.000 cm/s LVOT VTI:          0.089 m LVOT/AV VTI ratio: 0.53  AORTA Ao Root diam: 3.50 cm MITRAL VALVE MV Area (PHT): 3.99 cm    SHUNTS MV Decel Time: 190 msec    Systemic VTI:  0.09 m MR Peak grad: 69.4 mmHg    Systemic Diam: 2.10 cm MR Mean grad: 39.0 mmHg MR Vmax:      416.50 cm/s MR Vmean:     287.0 cm/s MV E velocity: 50.60 cm/s MV A velocity: 73.30 cm/s MV E/A ratio:  0.69 Dorothye Gathers MD Electronically signed by Dorothye Gathers MD Signature Date/Time: 01/04/2024/3:16:04 PM    Final    US  RENAL Result Date: 01/04/2024 CLINICAL DATA:  Acute renal failure EXAM: RENAL / URINARY TRACT ULTRASOUND COMPLETE COMPARISON:  Abdominal CT 04/07/2021 FINDINGS: Right Kidney: Renal measurements: 10 x 5.4 x 5.8 cm = volume: 160 mL. Mildly echogenic cortex with corticomedullary prominence. No mass or hydronephrosis visualized. Left Kidney: Renal measurements: 9 x 5 x 5 cm = volume: 120 mL. Mildly echogenic cortex with corticomedullary prominence. 5 mm stone with twinkle artifact, nonobstructive. Mild hydronephrosis. Bladder: Full urinary bladder with some internal echoes attributed to debris.  Jets were not seen on either side. Other: Bilateral pleural effusion which is simple where covered. IMPRESSION: 1. Mild left hydronephrosis.Left nephrolithiasis. 2. Medical renal disease with mild atrophy on the left. 3. Moderate distension of the urinary bladder which contains some debris, correlate with urinalysis. Electronically Signed   By: Ronnette Coke M.D.   On: 01/04/2024 07:59   DG Chest Portable 1 View Result Date: 01/04/2024 CLINICAL DATA:  Shortness of breath. EXAM: PORTABLE CHEST 1 VIEW COMPARISON:  07/23/2022 FINDINGS: The heart size appears enlarged which may be exacerbated by portable technique. There are small bilateral pleural effusions left greater than right. Increase interstitial markings are noted bilaterally. Right upper and bilateral lower lobe airspace opacities noted. Visualized osseous structures appear grossly intact. IMPRESSION: 1. Cardiomegaly, small bilateral pleural effusions and increased interstitial markings compatible with congestive heart failure. 2. Right upper and bilateral lower lobe airspace opacities may reflect edema or pneumonia. Electronically Signed   By: Kimberley Penman M.D.   On: 01/04/2024 05:44    ASSESSMENT & PLAN Connor Solomon 63 y.o. male with medical history significant for Waldenstrom's macroglobulinemia who presents for a follow up visit.   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  There is no clear evidence of hyperviscosity syndrome  at this time.  At this time we will plan to proceed with ibrutinib  and rituximab  therapy.  One could also consider Bendamustine and rituximab  therapy but given the patient's degree of anemia I would prefer pursuing ibrutinib  and rituximab  at this time.  Additionally ibrutinib /rituximab  are category 1 recommendations per the NCCN guidelines.  The treatment will consist of  ibrutinib  420 mg p.o. daily with rituximab  on weeks 1 through 4 as well as weeks 27 through 30.  Previously we discussed the risks and benefits of therapy including (but not limited to) risk of bleeding, fatigue, atypical infections, and cardiac arrhythmias.  The patient voices understanding of this plan moving forward.   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 --Started second round of weekly rituximab  on 12/02/2021. Continued weekly x 4 weeks.   --last rituximab  dose held due to missed visits/illness.  Plan: --Labs today were reviewed without any intervention needed.  Labs show white cell count 6.7, hemoglobin 12.5, MCV 81.8, platelets 176. Pending SPEP and sFLC levels but prior levels appear stable. --Currently on ibrutinib  420 mg PO daily.  --RTC in 4 weeks with labs   # Lesion on Back Of Head- resolved  -- Patient had a ping-pong ball sized lesion on the back of his head.  It appeared to be draining and has some dried blood and crusting. -- Patient evaluated by Dr. Myrtie Atkinson in dermatology.  Lesion was biopsied and found not to be cancerous. -- Appears to be healing well and improving greatly.  Continue per Dr. Christiane Cowing recommendations.  # Rash-improved markedly -- Affecting hands bilaterally, upper extremities bilaterally, and back.  Not affecting face, chest, or lower extremities. -- Patient has dryness and cracking as well as erythema on the upper extremities. -- Etiology is unclear.  Patient is using hydrating creams but is not effective.  Made referral to dermatology for evaluation, he is established with Dr. Louana Roup.  #Pain Management --Pain was poorly controlled on  hydrocodone  therapy previously.  --Patient was previously admitted for for opioid overdose with presumed fentanyl  --Currently pain regimen includes methadone  10 mg TID and oxycodone  10 mg PRN for breakthrough pain per palliative care. Patient reports pain is worsening and admits to taking double dose of oxycodone  occasionally. Rates pain as 7/10. -- Appreciate recommendations of palliative care.  #Normocytic Anemia  --Hgb 12.5, improved --previously there was a component of iron  deficiency anemia as well.  Currently on PO ferrous sulfate . --Continue to monitor.  #Nausea and vomiting--stable -- Concern that the nausea and vomiting may not be related to the ibrutinib  therapy and potentially tied to poor gastric motility due to opioid prescription.  --Unable to tolerate olanzopine so discontinued --Not taking metoclopramide  10 mg every 8 hours  --continue zofran  PRN.   #Fatigue #Anxiety/Depression: --Fatigue and depression have worsened recently --Anxiety has improved with Valium   --Will request further evaluation by palliative care team  #Supportive Care -- chemotherapy education complete -- port placement not required   No orders of the defined types were placed in this encounter.  All questions were answered. The patient knows to call the clinic with any problems, questions or concerns.  I have spent a total of 30 minutes minutes of face-to-face and non-face-to-face time, preparing to see the patient,  performing a medically appropriate  examination, counseling and educating the patient, ordering medications/tests, referring and communicating with other health care professionals, documenting clinical information in the electronic health record, and care coordination.   Rogerio Clay, MD Department of Hematology/Oncology [facility] Phone: [phone] Pager: [phone] Email: [email or web address]    01/27/2024 6:57  AM  Dimopoulos MA, Raechel Bulla, Trotman Rapheal Cable, Mahe B, Herbaux C, Tam C, Orsucci L, [place] ML, Matous JV, [place] C, Kastritis E, Treon SP, Li J, Salman Z, Graef T, Buske C; [employer] Study Group and the [employer]. Phase 3 Trial of Ibrutinib  plus Rituximab  in Waldenstrm's Macroglobulinemia. Ole Berkeley J Med. 2018 Jun 21;378(25):2399-2410.   --At 30 months, the progression-free survival rate was 82% with ibrutinib -rituximab  versus 28% with placebo-rituximab  (hazard ratio for progression or death, 0.20; P<0.001).

## 2024-01-30 ENCOUNTER — Inpatient Hospital Stay: Payer: Medicare (Managed Care)

## 2024-01-30 ENCOUNTER — Inpatient Hospital Stay (HOSPITAL_BASED_OUTPATIENT_CLINIC_OR_DEPARTMENT_OTHER): Payer: Medicare (Managed Care) | Admitting: Hematology and Oncology

## 2024-01-30 ENCOUNTER — Encounter (HOSPITAL_COMMUNITY): Payer: Medicare (Managed Care)

## 2024-01-30 ENCOUNTER — Other Ambulatory Visit: Payer: Self-pay | Admitting: Nurse Practitioner

## 2024-01-30 ENCOUNTER — Inpatient Hospital Stay (HOSPITAL_BASED_OUTPATIENT_CLINIC_OR_DEPARTMENT_OTHER): Payer: Medicare (Managed Care) | Admitting: Nurse Practitioner

## 2024-01-30 ENCOUNTER — Other Ambulatory Visit (HOSPITAL_COMMUNITY): Payer: Self-pay

## 2024-01-30 ENCOUNTER — Encounter: Payer: Self-pay | Admitting: Nurse Practitioner

## 2024-01-30 ENCOUNTER — Other Ambulatory Visit: Payer: Self-pay | Admitting: *Deleted

## 2024-01-30 ENCOUNTER — Other Ambulatory Visit: Payer: Self-pay

## 2024-01-30 VITALS — BP 115/88 | HR 85 | Temp 97.2°F | Resp 18 | Wt 119.1 lb

## 2024-01-30 DIAGNOSIS — I5022 Chronic systolic (congestive) heart failure: Secondary | ICD-10-CM | POA: Diagnosis not present

## 2024-01-30 DIAGNOSIS — C8800 Waldenstrom macroglobulinemia not having achieved remission: Secondary | ICD-10-CM

## 2024-01-30 DIAGNOSIS — Z79620 Long term (current) use of immunosuppressive biologic: Secondary | ICD-10-CM | POA: Diagnosis not present

## 2024-01-30 DIAGNOSIS — Z515 Encounter for palliative care: Secondary | ICD-10-CM

## 2024-01-30 DIAGNOSIS — I080 Rheumatic disorders of both mitral and aortic valves: Secondary | ICD-10-CM | POA: Diagnosis not present

## 2024-01-30 DIAGNOSIS — D509 Iron deficiency anemia, unspecified: Secondary | ICD-10-CM | POA: Diagnosis not present

## 2024-01-30 DIAGNOSIS — G893 Neoplasm related pain (acute) (chronic): Secondary | ICD-10-CM

## 2024-01-30 DIAGNOSIS — I3139 Other pericardial effusion (noninflammatory): Secondary | ICD-10-CM | POA: Diagnosis not present

## 2024-01-30 DIAGNOSIS — M799 Soft tissue disorder, unspecified: Secondary | ICD-10-CM | POA: Diagnosis not present

## 2024-01-30 DIAGNOSIS — M47814 Spondylosis without myelopathy or radiculopathy, thoracic region: Secondary | ICD-10-CM | POA: Diagnosis not present

## 2024-01-30 DIAGNOSIS — Z79899 Other long term (current) drug therapy: Secondary | ICD-10-CM | POA: Diagnosis not present

## 2024-01-30 DIAGNOSIS — F17290 Nicotine dependence, other tobacco product, uncomplicated: Secondary | ICD-10-CM | POA: Diagnosis not present

## 2024-01-30 DIAGNOSIS — Z86718 Personal history of other venous thrombosis and embolism: Secondary | ICD-10-CM | POA: Diagnosis not present

## 2024-01-30 DIAGNOSIS — D472 Monoclonal gammopathy: Secondary | ICD-10-CM | POA: Diagnosis present

## 2024-01-30 DIAGNOSIS — I110 Hypertensive heart disease with heart failure: Secondary | ICD-10-CM | POA: Diagnosis not present

## 2024-01-30 DIAGNOSIS — F32A Depression, unspecified: Secondary | ICD-10-CM | POA: Diagnosis not present

## 2024-01-30 DIAGNOSIS — Z8249 Family history of ischemic heart disease and other diseases of the circulatory system: Secondary | ICD-10-CM | POA: Diagnosis not present

## 2024-01-30 DIAGNOSIS — Z885 Allergy status to narcotic agent status: Secondary | ICD-10-CM | POA: Diagnosis not present

## 2024-01-30 DIAGNOSIS — I700 Atherosclerosis of aorta: Secondary | ICD-10-CM | POA: Diagnosis not present

## 2024-01-30 DIAGNOSIS — I2699 Other pulmonary embolism without acute cor pulmonale: Secondary | ICD-10-CM | POA: Diagnosis not present

## 2024-01-30 DIAGNOSIS — Z5986 Financial insecurity: Secondary | ICD-10-CM | POA: Diagnosis not present

## 2024-01-30 DIAGNOSIS — N3289 Other specified disorders of bladder: Secondary | ICD-10-CM | POA: Diagnosis not present

## 2024-01-30 DIAGNOSIS — F419 Anxiety disorder, unspecified: Secondary | ICD-10-CM | POA: Diagnosis not present

## 2024-01-30 DIAGNOSIS — N179 Acute kidney failure, unspecified: Secondary | ICD-10-CM | POA: Diagnosis not present

## 2024-01-30 DIAGNOSIS — N132 Hydronephrosis with renal and ureteral calculous obstruction: Secondary | ICD-10-CM | POA: Diagnosis not present

## 2024-01-30 DIAGNOSIS — R530 Neoplastic (malignant) related fatigue: Secondary | ICD-10-CM | POA: Diagnosis not present

## 2024-01-30 LAB — CBC WITH DIFFERENTIAL (CANCER CENTER ONLY)
Abs Immature Granulocytes: 0.01 10*3/uL (ref 0.00–0.07)
Basophils Absolute: 0 10*3/uL (ref 0.0–0.1)
Basophils Relative: 1 %
Eosinophils Absolute: 0.2 10*3/uL (ref 0.0–0.5)
Eosinophils Relative: 5 %
HCT: 43.9 % (ref 39.0–52.0)
Hemoglobin: 13.9 g/dL (ref 13.0–17.0)
Immature Granulocytes: 0 %
Lymphocytes Relative: 21 %
Lymphs Abs: 0.7 10*3/uL (ref 0.7–4.0)
MCH: 25.8 pg — ABNORMAL LOW (ref 26.0–34.0)
MCHC: 31.7 g/dL (ref 30.0–36.0)
MCV: 81.6 fL (ref 80.0–100.0)
Monocytes Absolute: 0.3 10*3/uL (ref 0.1–1.0)
Monocytes Relative: 9 %
Neutro Abs: 2.3 10*3/uL (ref 1.7–7.7)
Neutrophils Relative %: 64 %
Platelet Count: 184 10*3/uL (ref 150–400)
RBC: 5.38 MIL/uL (ref 4.22–5.81)
RDW: 16.4 % — ABNORMAL HIGH (ref 11.5–15.5)
WBC Count: 3.5 10*3/uL — ABNORMAL LOW (ref 4.0–10.5)
nRBC: 0 % (ref 0.0–0.2)

## 2024-01-30 LAB — CMP (CANCER CENTER ONLY)
ALT: 11 U/L (ref 0–44)
AST: 16 U/L (ref 15–41)
Albumin: 3.7 g/dL (ref 3.5–5.0)
Alkaline Phosphatase: 62 U/L (ref 38–126)
Anion gap: 6 (ref 5–15)
BUN: 8 mg/dL (ref 8–23)
CO2: 28 mmol/L (ref 22–32)
Calcium: 8.9 mg/dL (ref 8.9–10.3)
Chloride: 104 mmol/L (ref 98–111)
Creatinine: 0.9 mg/dL (ref 0.61–1.24)
GFR, Estimated: >60 mL/min (ref >60)
Glucose, Bld: 107 mg/dL — ABNORMAL HIGH (ref 70–99)
Potassium: 4.3 mmol/L (ref 3.5–5.1)
Sodium: 138 mmol/L (ref 135–145)
Total Bilirubin: 0.6 mg/dL (ref 0.0–1.2)
Total Protein: 6.1 g/dL — ABNORMAL LOW (ref 6.5–8.1)

## 2024-01-30 LAB — LACTATE DEHYDROGENASE: LDH: 105 U/L (ref 98–192)

## 2024-01-30 LAB — RAPID URINE DRUG SCREEN, HOSP PERFORMED
Amphetamines: POSITIVE — AB
Barbiturates: NOT DETECTED
Benzodiazepines: POSITIVE — AB
Cocaine: NOT DETECTED
Opiates: NOT DETECTED
Tetrahydrocannabinol: POSITIVE — AB

## 2024-01-30 MED ORDER — IBRUTINIB 420 MG PO TABS: 420.0000 mg | ORAL_TABLET | Freq: Every day | ORAL | 2 refills | Status: DC

## 2024-01-30 NOTE — Progress Notes (Signed)
 [facility] [phone]   [phone]  PROGRESS NOTE  Patient Care Team: Aurora Lees, DO as PCP - General Bensimhon, Rheta Celestine, MD as PCP - Cardiology (Cardiology) Pickenpack-Cousar, Giles Labrum, NP as Nurse Practitioner (Nurse Practitioner)  Hematological/Oncological History # Waldenstrom's Macroglobulinemia # IgM Monoclonal Gammopathy 04/07/2021: CT A/p showed mildly enlarged retroperitoneal lymph nodes and bilateral inguinal lymph nodes.  04/08/2021: IgM Kappa M protein 1.2 04/09/2021: WBC 11.1, Hgb 9.5, MCV 75.4, Plt 525 04/20/2021: Kappa 977, Lambda 7.8, K/L ratio 125.28. Serum viscosity 1.8 05/06/2021: Establish care with Dr. 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 Infusion reaction with ritux, held halfway through.  06/12/2021: Cycle 2 Day 1 of Ibrutinib  + Rituximab  06/19/2021: Cycle 3 Day 1 of Ibrutinib  + Rituximab  06/26/2021: Cycle 4 Day 1 of rituximab . Continued on daily ibrutinib  12/02/2021: Cycle 5 Day 1 of Ibrutinib  + Rituximab  12/09/2021: Cycle 6 Day 1 of Ibrutinib  + Rituximab  12/16/2021: Cycle 7 Day 1 of Ibrutinib  + Rituximab  12/23/2021: Held Cycle 8 of Ritxumab due to nausea/vomiting, elevated creatinine, finger infection  Interval History:  Connor Solomon 63 y.o. male with medical history significant for Waldenstrom's macroglobulinemia who presents for a follow up visit. The patient's last visit was on 12/09/2023. In the interim since the last visit he has continued on Ibrutinib  therapy and was admitted for congestive heart failure exacerbation.  On exam today Connor Solomon reports he has been doing well overall in the absence at last visit.  He is down 11 pounds, presumed to be mostly fluid.  He reports he is taking Lasix  at home.  He is not having any  lightheadedness or dizziness but does endorse shortness of breath, particular on exertion walking.  He reports ibrutinib  is not causing any trouble.  He is not having any bleeding or major bruising.  He has some minor bruising on his arms.  He reports that he is not having any dryness or cracking of his hands at the moment.  His energy is still quite poor and he ranks about a 3 out of 10.  He reports he "just cannot get things done".  He notes he has not had any recent infectious symptoms such as fevers, chills, sweats.  He reports that he does have some occasional palpitations and tightness on the chest but no frank chest pain.  Otherwise he reports nothing of the ordinary.  He has no other complaints. Full 10 point ROS is listed below.   MEDICAL HISTORY:  Past Medical History:  Diagnosis Date   Acute heart failure (HCC) 04/08/2021   Acute HFrEF (heart failure with reduced ejection fraction) (HCC) 04/07/2021   Arthritis    hands, elbows bilaterally   Cancer (HCC)    Full dentures    Hypertension    Pt states he doen not have HTN and has never been treated for HTN   Kidney stone on left side last stone 4-5 yrs ago   recurrent (3 episodes)   Panic disorder    pt states has resolved   Syncope    resolved- was related to panic attacks    SURGICAL HISTORY: Past Surgical History:  Procedure Laterality Date   CARPAL METACARPAL FUSION WITH DISTAL RADIAL BONE GRAFT Left 05/30/2013   Procedure: LEFT THUMB METACARPAL JOINT FUSION;  Surgeon: Hedy Living, MD;  Location: [facility];  Service: Orthopedics;  Laterality: Left;   ESOPHAGOGASTRODUODENOSCOPY N/A 02/12/2014   Procedure: ESOPHAGOGASTRODUODENOSCOPY (EGD);  Surgeon: Nannette Babe, MD;  Location: [facility] ENDOSCOPY;  Service: Endoscopy;  Laterality: N/A;   FOREIGN BODY REMOVAL N/A 02/12/2014   Procedure: FOREIGN BODY REMOVAL;  Surgeon: Nannette Babe, MD;  Location: [facility]COPY;  Service: Endoscopy;  Laterality: N/A;   OPEN REDUCTION  INTERNAL FIXATION (ORIF) DISTAL RADIAL FRACTURE Left 11/29/2012   Procedure: OPEN REDUCTION INTERNAL FIXATION (ORIF) DISTAL RADIAL FRACTURE;  Surgeon: Hedy Living, MD;  Location: [facility];  Service: Orthopedics;  Laterality: Left;  LEFT DISTAL RADIUS OSTEOTOMY WITH BONE GRAFT   RIGHT/LEFT HEART CATH AND CORONARY ANGIOGRAPHY N/A 04/09/2021   Procedure: RIGHT/LEFT HEART CATH AND CORONARY ANGIOGRAPHY;  Surgeon: Mardell Shade, MD;  Location: [facility] INVASIVE CV LAB;  Service: Cardiovascular;  Laterality: N/A;   TENDON REPAIR Left 02/07/2013   Procedure: LEFT EXTENSOR INDICIS PROPRIUS  TO EXTENSOR POLLICIS LONGUS TENDON TRANSFER;  Surgeon: Hedy Living, MD;  Location: [facility];  Service: Orthopedics;  Laterality: Left;   WISDOM TOOTH EXTRACTION     WRIST SURGERY     fx only    SOCIAL HISTORY: Social History   Socioeconomic History   Marital status: Single    Spouse name: Not on file   Number of children: Not on file   Years of education: Not on file   Highest education level: Not on file  Occupational History   Not on file  Tobacco Use   Smoking status: Former    Types: Cigars    Quit date: 10/11/2020    Years since quitting: 3.3   Smokeless tobacco: Never   Tobacco comments:    smokes 1 cigar a week  Vaping Use   Vaping status: Never Used  Substance and Sexual Activity   Alcohol use: No   Drug use: No   Sexual activity: Not on file  Other Topics Concern   Not on file  Social History Narrative   Not on file   Social Drivers of Health   Financial Resource Strain: High Risk (07/17/2021)   Overall Financial Resource Strain (CARDIA)    Difficulty of Paying Living Expenses: Very hard  Food Insecurity: No Food Insecurity (01/04/2024)   Hunger Vital Sign    Worried About Running Out of Food in the Last Year: Never true    Ran Out of Food in the Last Year: Never true  Transportation Needs: No Transportation Needs (01/04/2024)   PRAPARE -  Administrator, Civil Service (Medical): No    Lack of Transportation (Non-Medical): No  Physical Activity: Not on file  Stress: Stress Concern Present (09/01/2021)   [employer] of Occupational Health - Occupational Stress Questionnaire    Feeling of Stress : Very much  Social Connections: Not on file  Intimate Partner Violence: Not At Risk (01/04/2024)   Humiliation, Afraid, Rape, and Kick questionnaire    Fear of Current or Ex-Partner: No    Emotionally Abused: No    Physically Abused: No    Sexually Abused: No    FAMILY HISTORY: Family History  Problem Relation Age of Onset   Heart attack Father    Sudden Cardiac Death Father 32   Diabetes Neg Hx    Hyperlipidemia Neg Hx    Hypertension Neg Hx     ALLERGIES:  is allergic to rituxan  [rituximab ] and hydrocodone .  MEDICATIONS:  Current Outpatient Medications  Medication Sig Dispense Refill   acetaminophen  (TYLENOL ) 500  MG tablet Take 500 mg by mouth every 6 (six) hours as needed.     albuterol  (PROAIR  HFA) 108 (90 Base) MCG/ACT inhaler Inhale 2 puffs into the lungs every 6 (six) hours as needed for wheezing or shortness of breath. 6.7 g 2   aspirin  EC 325 MG tablet Take 1 tablet (325 mg total) by mouth daily. 30 tablet 0   carvedilol  (COREG ) 3.125 MG tablet Take 1 tablet (3.125 mg total) by mouth 2 (two) times daily. NEEDS FOLLOW UP APPOINTMENT FOR MORE REFILLS 60 tablet 0   chlorhexidine  (HIBICLENS ) 4 % external liquid Apply topically daily as needed. Dilute with water and clean wound with this.  Rinse thoroughly afterwards (Patient not taking: Reported on 01/04/2024) 237 mL 0   clobetasol  cream (TEMOVATE ) 0.05 % Apply 1 Application topically 2 (two) times daily. Apply topically to the arms and hands twice daily for up to two weeks 30 g 2   colchicine  0.6 MG tablet Take 1 tablet (0.6 mg total) by mouth 2 (two) times daily. 180 tablet 0   dapagliflozin  propanediol (FARXIGA ) 10 MG TABS tablet Take 1 tablet (10 mg  total) by mouth daily before breakfast. NEEDS FOLLOW UP APPOINTMENT FOR ANYMORE REFILLS 30 tablet 3   diazepam  (VALIUM ) 5 MG tablet Take 1 tablet (5 mg total) by mouth every 12 (twelve) hours as needed for anxiety. 60 tablet 0   DULoxetine  (CYMBALTA ) 30 MG capsule Take 1 capsule (30 mg total) by mouth daily. 30 capsule 0   eplerenone  (INSPRA ) 25 MG tablet Take 0.5 tablets (12.5 mg total) by mouth daily. NEEDS FOLLOW UP APPOINTMENT FOR MORE REFILLS 15 tablet 0   famotidine  (PEPCID ) 20 MG tablet Take 1 tablet (20 mg total) by mouth at bedtime. 90 tablet 0   ferrous sulfate  (FEROSUL) 325 (65 FE) MG tablet Take 1 tablet by mouth daily with breakfast. Please take with a source of Vitamin C 90 tablet 3   furosemide  (LASIX ) 40 MG tablet Take 1 tablet (40 mg total) by mouth daily as needed for fluid or edema. 30 tablet 0   ibrutinib  (IMBRUVICA ) 420 MG tablet Take 1 tablet (420 mg total) by mouth daily. 28 tablet 2   methadone  (DOLOPHINE ) 10 MG tablet Take 1 tablet (10 mg total) by mouth every 8 (eight) hours. 90 tablet 0   naloxone  (NARCAN ) nasal spray 4 mg/0.1 mL Take for overdose 1 each 0   naloxone  (NARCAN ) nasal spray 4 mg/0.1 mL Take for overdose as directed 2 each 0   ondansetron  (ZOFRAN ) 8 MG tablet Take 1 tablet (8 mg) by mouth every 8 hours as needed. 30 tablet 0   Oxycodone  HCl 10 MG TABS Take 1 tablet (10 mg total) by mouth every 4 (four) hours as needed. 90 tablet 0   sacubitril -valsartan  (ENTRESTO ) 24-26 MG Take 1 tablet by mouth 2 (two) times daily. 60 tablet 0   sildenafil  (VIAGRA ) 50 MG tablet Take 1 tablet (50 mg total) by mouth daily as needed for erectile dysfunction. 20 tablet 0   No current facility-administered medications for this visit.    REVIEW OF SYSTEMS:   Constitutional: ( - ) fevers, ( - )  chills , ( - ) night sweats Eyes: ( - ) blurriness of vision, ( - ) double vision, ( - ) watery eyes Ears, nose, mouth, throat, and face: ( - ) mucositis, ( - ) sore  throat Respiratory: ( - ) cough, ( - ) dyspnea, ( - ) wheezes Cardiovascular: ( - )  palpitation, ( - ) chest discomfort, ( - ) lower extremity swelling Gastrointestinal:  (- ) nausea, ( - ) heartburn, ( - ) change in bowel habits Skin: ( - ) abnormal skin rashes Lymphatics: ( - ) new lymphadenopathy, ( - ) easy bruising Neurological: ( - ) numbness, ( - ) tingling, ( - ) new weaknesses Behavioral/Psych: ( - ) mood change, ( - ) new changes  All other systems were reviewed with the patient and are negative.  PHYSICAL EXAMINATION: ECOG PERFORMANCE STATUS: 2 - Symptomatic, <50% confined to bed  There were no vitals filed for this visit.    There were no vitals filed for this visit.     GENERAL: Chronically ill appearing middle-aged Caucasian male, alert, no distress and comfortable SKIN: Dry cracking rash of hands bilaterally.  No issues on the chest, face, or lower extremities.  Skin color, texture, turgor are normal, no rashes or significant lesions.  EYES: conjunctiva are pink and non-injected, sclera clear LUNGS: clear to auscultation and percussion with normal breathing effort HEART: regular rate & rhythm and no murmurs and no lower extremity edema  MSK: Bilateral edema of hands.  PSYCH: alert & oriented x 3, fluent speech NEURO: no focal motor/sensory deficits   LABORATORY DATA:  I have reviewed the data as listed    Latest Ref Rng & Units 01/30/2024    1:38 PM 01/10/2024    4:02 AM 01/09/2024    3:19 AM  CBC  WBC 4.0 - 10.5 K/uL 3.5  10.4  17.2   Hemoglobin 13.0 - 17.0 g/dL 40.9  81.1  91.4   Hematocrit 39.0 - 52.0 % 43.9  36.1  39.8   Platelets 150 - 400 K/uL 184  223  250        Latest Ref Rng & Units 01/30/2024    1:38 PM 01/10/2024    4:02 AM 01/09/2024    3:19 AM  CMP  Glucose 70 - 99 mg/dL 782  88  97   BUN 8 - 23 mg/dL 8  20  17    Creatinine 0.61 - 1.24 mg/dL 9.56  2.13  0.86   Sodium 135 - 145 mmol/L 138  132  132   Potassium 3.5 - 5.1 mmol/L 4.3  3.8  4.5    Chloride 98 - 111 mmol/L 104  99  100   CO2 22 - 32 mmol/L 28  23  23    Calcium 8.9 - 10.3 mg/dL 8.9  8.5  8.7   Total Protein 6.5 - 8.1 g/dL 6.1     Total Bilirubin 0.0 - 1.2 mg/dL 0.6     Alkaline Phos 38 - 126 U/L 62     AST 15 - 41 U/L 16     ALT 0 - 44 U/L 11       Lab Results  Component Value Date   MPROTEIN 0.1 (H) 12/09/2023   MPROTEIN 0.1 (H) 11/11/2023   MPROTEIN 0.1 (H) 09/12/2023   Lab Results  Component Value Date   KPAFRELGTCHN 24.5 (H) 12/09/2023   KPAFRELGTCHN 26.1 (H) 11/11/2023   KPAFRELGTCHN 29.3 (H) 09/12/2023   LAMBDASER 8.1 12/09/2023   LAMBDASER 8.9 11/11/2023   LAMBDASER 7.1 09/12/2023   KAPLAMBRATIO 3.02 (H) 12/09/2023   KAPLAMBRATIO 2.93 (H) 11/11/2023   KAPLAMBRATIO 4.13 (H) 09/12/2023    RADIOGRAPHIC STUDIES: ECHOCARDIOGRAM LIMITED Result Date: 01/09/2024    ECHOCARDIOGRAM LIMITED REPORT   Patient Name:   Connor Solomon Date of Exam: 01/09/2024 Medical Rec #:  [id number]     Height:       68.0 in Accession #:    [id number]    Weight:       120.8 lb Date of Birth:  02-06-1961     BSA:          1.650 m Patient Age:    62 years      BP:           91/69 mmHg Patient Gender: M             HR:           70 bpm. Exam Location:  Inpatient Procedure: Limited Echo (Both Spectral and Color Flow Doppler were utilized            during procedure). Indications:    I31.3 Pericardial effusion (noninflammatory)  History:        Patient has prior history of Echocardiogram examinations, most                 recent 01/04/2024. CHF and Cardiomyopathy, Mitral Valve Disease,                 Signs/Symptoms:Chest Pain and Dyspnea; Risk                 Factors:Hypertension. Pericardial effusion.  Sonographer:    Raynelle Callow RDCS Referring Phys: [id number] Ethelle Herb A ACHARYA IMPRESSIONS  1. There is no left ventricular thrombus. Left ventricular ejection fraction, by estimation, is <20%. The left ventricle has severely decreased function. The left ventricle demonstrates global hypokinesis.  The left ventricular internal cavity size was moderately dilated. Left ventricular diastolic parameters are consistent with Grade III diastolic dysfunction (restrictive).  2. Right ventricular systolic function is normal. The right ventricular size is normal.  3. The pericardium is circumferentially thickened. There is no respiratory flow variation across the mitral or tricuspid valve to support a diagnosis of constrictive pericarditis.  4. The mitral valve is normal in structure. No evidence of mitral valve regurgitation. No evidence of mitral stenosis.  5. The aortic valve is normal in structure. Aortic valve regurgitation is not visualized. No aortic stenosis is present.  6. The inferior vena cava is dilated in size with <50% respiratory variability, suggesting right atrial pressure of 15 mmHg. Comparison(s): Prior images reviewed side by side. There was a trivial amount of anterior pericardial effusion on the previous study, which has now resolved. Otherwise no change. FINDINGS  Left Ventricle: There is no left ventricular thrombus. Left ventricular ejection fraction, by estimation, is <20%. The left ventricle has severely decreased function. The left ventricle demonstrates global hypokinesis. The left ventricular internal cavity size was moderately dilated. There is no left ventricular hypertrophy. Left ventricular diastolic parameters are consistent with Grade III diastolic dysfunction (restrictive). Right Ventricle: The right ventricular size is normal. No increase in right ventricular wall thickness. Right ventricular systolic function is normal. Left Atrium: Left atrial size was normal in size. Right Atrium: Right atrial size was normal in size. Pericardium: The pericardium is circumferentially thickened. There is no respiratory flow variation across the mitral or tricuspid valve to support a diagnosis of constrictive pericarditis. There is no evidence of pericardial effusion. Mitral Valve: The mitral valve  is normal in structure. No evidence of mitral valve stenosis. Tricuspid Valve: The tricuspid valve is normal in structure. Tricuspid valve regurgitation is not demonstrated. No evidence of tricuspid stenosis. Aortic Valve: The aortic valve is normal in structure. Aortic valve regurgitation is not visualized. No aortic stenosis  is present. Pulmonic Valve: The pulmonic valve was normal in structure. Pulmonic valve regurgitation is not visualized. No evidence of pulmonic stenosis. Aorta: The aortic root is normal in size and structure. Venous: The inferior vena cava is dilated in size with less than 50% respiratory variability, suggesting right atrial pressure of 15 mmHg. IAS/Shunts: No atrial level shunt detected by color flow Doppler. IVC IVC diam: 2.20 cm Mihai Croitoru MD Electronically signed by Luana Rumple MD Signature Date/Time: 01/09/2024/2:29:37 PM    Final    CT HEAD WO CONTRAST ( ) Result Date: 01/07/2024 CLINICAL DATA:  Mental status change, unknown cause EXAM: CT HEAD WITHOUT CONTRAST TECHNIQUE: Contiguous axial images were obtained from the base of the skull through the vertex without intravenous contrast. RADIATION DOSE REDUCTION: This exam was performed according to the departmental dose-optimization program which includes automated exposure control, adjustment of the mA and/or kV according to patient size and/or use of iterative reconstruction technique. COMPARISON:  CT head 05/31/2020 FINDINGS: Brain: No evidence of acute infarction, hemorrhage, hydrocephalus, extra-axial collection or mass lesion/mass effect. Vascular: Calcific atherosclerosis. Skull: No acute fracture. Sinuses/Orbits: Mostly clear sinuses.  No acute orbital findings. Other: No mastoid effusions. IMPRESSION: No evidence of acute intracranial abnormality. Electronically Signed   By: Stevenson Elbe M.D.   On: 01/07/2024 20:15   DG Chest Port 1 View Result Date: 01/07/2024 CLINICAL DATA:  Chest pain. EXAM: PORTABLE CHEST 1  VIEW COMPARISON:  Chest radiographs 01/04/2024 and 07/23/2022. Chest CT 01/04/2024. FINDINGS: 1023 hours. Stable cardiomegaly and aortic atherosclerosis. Previously demonstrated bilateral pleural effusions and bibasilar pulmonary opacities have improved. There is residual mild bibasilar atelectasis. No pneumothorax or definite edema. Skin folds overlie the chest bilaterally. There is an old healed fracture of the right humeral neck. No acute osseous findings are seen. IMPRESSION: Interval improvement in previously demonstrated bilateral pleural effusions and bibasilar pulmonary opacities. No acute cardiopulmonary process identified. Electronically Signed   By: Elmon Hagedorn M.D.   On: 01/07/2024 10:46   VAS US  LOWER EXTREMITY VENOUS (DVT) Result Date: 01/04/2024  Lower Venous DVT Study Patient Name:  Connor Solomon  Date of Exam:   01/04/2024 Medical Rec #: [id number]      Accession #:    [id number] Date of Birth: 1960/11/19      Patient Gender: M Patient Age:   42 years Exam Location:  [facility] Procedure:      VAS US  LOWER EXTREMITY VENOUS (DVT) Referring Phys: Melford Spotted --------------------------------------------------------------------------------  Indications: Edema.  Risk Factors: DVT Hx. Anticoagulation: Lovenox . Comparison Study: None. Performing Technologist: Estanislao Heimlich  Examination Guidelines: A complete evaluation includes B-mode imaging, spectral Doppler, color Doppler, and power Doppler as needed of all accessible portions of each vessel. Bilateral testing is considered an integral part of a complete examination. Limited examinations for reoccurring indications may be performed as noted. The reflux portion of the exam is performed with the patient in reverse Trendelenburg.  +---------+---------------+---------+-----------+----------+--------------+ RIGHT    CompressibilityPhasicitySpontaneityPropertiesThrombus Aging  +---------+---------------+---------+-----------+----------+--------------+ CFV      Full           Yes      Yes                                 +---------+---------------+---------+-----------+----------+--------------+ SFJ      Full                                                        +---------+---------------+---------+-----------+----------+--------------+  FV Prox  Full                                                        +---------+---------------+---------+-----------+----------+--------------+ FV Mid   Full                                                        +---------+---------------+---------+-----------+----------+--------------+ FV DistalFull                                                        +---------+---------------+---------+-----------+----------+--------------+ PFV      Full                                                        +---------+---------------+---------+-----------+----------+--------------+ POP      Full           Yes      Yes                                 +---------+---------------+---------+-----------+----------+--------------+ PTV      Full                                                        +---------+---------------+---------+-----------+----------+--------------+ PERO     Full                                                        +---------+---------------+---------+-----------+----------+--------------+   +---------+---------------+---------+-----------+----------+--------------+ LEFT     CompressibilityPhasicitySpontaneityPropertiesThrombus Aging +---------+---------------+---------+-----------+----------+--------------+ CFV      Full           Yes      Yes                                 +---------+---------------+---------+-----------+----------+--------------+ SFJ      Full                                                         +---------+---------------+---------+-----------+----------+--------------+ FV Prox  Full                                                        +---------+---------------+---------+-----------+----------+--------------+  FV Mid   Full                                                        +---------+---------------+---------+-----------+----------+--------------+ FV DistalFull                                                        +---------+---------------+---------+-----------+----------+--------------+ PFV      Full                                                        +---------+---------------+---------+-----------+----------+--------------+ POP      Full           Yes      Yes                                 +---------+---------------+---------+-----------+----------+--------------+ PTV      Full                                                        +---------+---------------+---------+-----------+----------+--------------+ PERO     Full                                                        +---------+---------------+---------+-----------+----------+--------------+     Summary: BILATERAL: - No evidence of deep vein thrombosis seen in the lower extremities, bilaterally. -No evidence of popliteal cyst, bilaterally.   *See table(s) above for measurements and observations. Electronically signed by Genny Kid MD on 01/04/2024 at 10:04:37 PM.    Final    CT Angio Chest Pulmonary Embolism (PE) W or WO Contrast Result Date: 01/04/2024 CLINICAL DATA:  Dyspnea with elevated D-dimer EXAM: CT ANGIOGRAPHY CHEST WITH CONTRAST TECHNIQUE: Multidetector CT imaging of the chest was performed using the standard protocol during bolus administration of intravenous contrast. Multiplanar CT image reconstructions and MIPs were obtained to evaluate the vascular anatomy. RADIATION DOSE REDUCTION: This exam was performed according to the departmental dose-optimization program  which includes automated exposure control, adjustment of the mA and/or kV according to patient size and/or use of iterative reconstruction technique. CONTRAST:  75mL OMNIPAQUE  IOHEXOL  350 MG/ML SOLN COMPARISON:  05/12/2021 FINDINGS: Cardiovascular: Atherosclerotic calcifications of the thoracic aorta are noted. Mild dilatation of the ascending aorta to 4 cm is noted. The pulmonary artery shows a normal branching pattern bilaterally. No intraluminal filling defect to suggest pulmonary embolism is noted. Mild coronary calcifications are seen. The heart is enlarged in size. Minimal pericardial effusion is noted. Mediastinum/Nodes: Thoracic inlet is within normal limits. No hilar or mediastinal adenopathy is noted. The esophagus as visualized is within normal limits. Lungs/Pleura: Lungs  are well aerated bilaterally. Bilateral moderate to large pleural effusions are noted with some compressive atelectasis in the bases. Scattered ground-glass opacities are noted in the upper lobes on the left on image number 32 of series 13 and on the right on image number 64 of series 13. These were not present on the prior exam enter likely inflammatory in nature. The right-sided one measures up to 8 mm. Upper Abdomen: Visualized upper abdomen is within normal limits. Musculoskeletal: Degenerative changes of the thoracic spine are noted. No acute bony abnormality is noted. Review of the MIP images confirms the above findings. IMPRESSION: No evidence of pulmonary emboli. 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 Recommend annual imaging followup by CTA or MRA. This recommendation follows 2010 ACCF/AHA/AATS/ACR/ASA/SCA/SCAI/SIR/STS/SVM Guidelines for the Diagnosis and Management of Patients with Thoracic Aortic Disease. Circulation. 2010; 121: J478-G956. Aortic aneurysm NOS (ICD10-I71.9) Patchy ground-glass opacity in the upper lobes bilaterally as described. These are likely postinflammatory in nature. Initial follow-up with CT at 6  months is recommended to confirm persistence. If persistent, repeat CT is recommended every 2 years until 5 years of stability has been established. This recommendation follows the consensus statement: Guidelines for Management of Incidental Pulmonary Nodules Detected on CT Images: From the Fleischner Society 2017; Radiology 2017; 284:228-243. Aortic Atherosclerosis (ICD10-I70.0). Electronically Signed   By: Violeta Grey M.D.   On: 01/04/2024 20:03   ECHOCARDIOGRAM COMPLETE Result Date: 01/04/2024    ECHOCARDIOGRAM REPORT   Patient Name:   KALIN KYLER Date of Exam: 01/04/2024 Medical Rec #:  [id number]     Height:       68.0 in Accession #:    [id number]    Weight:       134.0 lb Date of Birth:  12/23/1960     BSA:          1.724 m Patient Age:    62 years      BP:           118/72 mmHg Patient Gender: M             HR:           66 bpm. Exam Location:  Inpatient Procedure: 2D Echo, 3D Echo, Cardiac Doppler, Color Doppler, Strain Analysis and            Intracardiac Opacification Agent (Both Spectral and Color Flow            Doppler were utilized during procedure). Indications:    Congestive Heart Failure  History:        Patient has prior history of Echocardiogram examinations, most                 recent 09/14/2021. Risk Factors:Hypertension and Former Smoker.  Sonographer:    Reta Cassis Referring Phys: [id number] Angelene Kelly  Sonographer Comments: Global longitudinal strain was attempted. IMPRESSIONS  1. Left ventricular ejection fraction, by estimation, is <20%. Left ventricular ejection fraction by 3D volume is 26 %. The left ventricle has severely decreased function. The left ventricle has no regional wall motion abnormalities. The left ventricular internal cavity size was moderately dilated. Left ventricular diastolic parameters are consistent with Grade III diastolic dysfunction (restrictive). The average left ventricular global longitudinal strain is -3.9 %. The global longitudinal strain is abnormal.   2. Right ventricular systolic function is normal. The right ventricular size is normal.  3. Left atrial size was severely dilated.  4. Right atrial size was mildly dilated.  5. The  mitral valve is normal in structure. Mild mitral valve regurgitation. No evidence of mitral stenosis.  6. The aortic valve is normal in structure. Aortic valve regurgitation is trivial. No aortic stenosis is present.  7. The inferior vena cava is normal in size with <50% respiratory variability, suggesting right atrial pressure of 8 mmHg. Comparison(s): Prior images reviewed side by side. FINDINGS  Left Ventricle: Left ventricular ejection fraction, by estimation, is <20%. Left ventricular ejection fraction by 3D volume is 26 %. The left ventricle has severely decreased function. The left ventricle has no regional wall motion abnormalities. Definity  contrast agent was given IV to delineate the left ventricular endocardial borders. The average left ventricular global longitudinal strain is -3.9 %. Strain was performed and the global longitudinal strain is abnormal. 3D ejection fraction reviewed and evaluated as part of the interpretation. Alternate measurement of EF is felt to be most reflective of LV function. The left ventricular internal cavity size was moderately dilated. There is no left ventricular hypertrophy. Left ventricular diastolic parameters are consistent with Grade III diastolic dysfunction (restrictive). Right Ventricle: The right ventricular size is normal. No increase in right ventricular wall thickness. Right ventricular systolic function is normal. Left Atrium: Left atrial size was severely dilated. Right Atrium: Right atrial size was mildly dilated. Pericardium: There is no evidence of pericardial effusion. Mitral Valve: The mitral valve is normal in structure. Mild mitral valve regurgitation. No evidence of mitral valve stenosis. Tricuspid Valve: The tricuspid valve is normal in structure. Tricuspid valve  regurgitation is not demonstrated. No evidence of tricuspid stenosis. Aortic Valve: The aortic valve is normal in structure. Aortic valve regurgitation is trivial. No aortic stenosis is present. Aortic valve mean gradient measures 3.0 mmHg. Aortic valve peak gradient measures 5.2 mmHg. Aortic valve area, by VTI measures 1.84 cm. Pulmonic Valve: The pulmonic valve was normal in structure. Pulmonic valve regurgitation is not visualized. No evidence of pulmonic stenosis. Aorta: The aortic root is normal in size and structure. Venous: The inferior vena cava is normal in size with less than 50% respiratory variability, suggesting right atrial pressure of 8 mmHg. IAS/Shunts: No atrial level shunt detected by color flow Doppler. Additional Comments: 3D was performed not requiring image post processing on an independent workstation and was abnormal.  LEFT VENTRICLE PLAX 2D LVIDd:         6.30 cm         Diastology LVIDs:         6.10 cm         LV e' medial:    3.81 cm/s LV PW:         0.90 cm         LV E/e' medial:  13.3 LV IVS:        0.90 cm         LV e' lateral:   7.07 cm/s LVOT diam:     2.10 cm         LV E/e' lateral: 7.2 LV SV:         31 LV SV Index:   18              2D Longitudinal LVOT Area:     3.46 cm        Strain                                2D Strain GLS   -3.9 %  Avg: LV Volumes (MOD) LV vol d, MOD    244.0 ml      3D Volume EF A2C:                           LV 3D EF:    Left LV vol d, MOD    252.0 ml                   ventricul A4C:                                        ar LV vol s, MOD    193.0 ml                   ejection A2C:                                        fraction LV vol s, MOD    217.0 ml                   by 3D A4C:                                        volume is LV SV MOD A2C:   51.0 ml                    26 %. LV SV MOD A4C:   252.0 ml LV SV MOD BP:    48.3 ml                                3D Volume EF:                                3D EF:        26  %                                LV EDV:       261 ml                                LV ESV:       194 ml                                LV SV:        67 ml RIGHT VENTRICLE            IVC RV Basal diam:  3.50 cm    IVC diam: 1.90 cm RV S prime:     7.72 cm/s TAPSE (M-mode): 1.5 cm LEFT ATRIUM              Index        RIGHT ATRIUM           Index LA diam:        5.40 cm  3.13  cm/m   RA Area:     21.40 cm LA Vol (A2C):   119.0 ml 69.03 ml/m  RA Volume:   64.80 ml  37.59 ml/m LA Vol (A4C):   92.8 ml  53.83 ml/m LA Biplane Vol: 108.0 ml 62.65 ml/m  AORTIC VALVE AV Area (Vmax):    2.11 cm AV Area (Vmean):   1.88 cm AV Area (VTI):     1.84 cm AV Vmax:           114.00 cm/s AV Vmean:          77.400 cm/s AV VTI:            0.167 m AV Peak Grad:      5.2 mmHg AV Mean Grad:      3.0 mmHg LVOT Vmax:         69.40 cm/s LVOT Vmean:        42.000 cm/s LVOT VTI:          0.089 m LVOT/AV VTI ratio: 0.53  AORTA Ao Root diam: 3.50 cm MITRAL VALVE MV Area (PHT): 3.99 cm    SHUNTS MV Decel Time: 190 msec    Systemic VTI:  0.09 m MR Peak grad: 69.4 mmHg    Systemic Diam: 2.10 cm MR Mean grad: 39.0 mmHg MR Vmax:      416.50 cm/s MR Vmean:     287.0 cm/s MV E velocity: 50.60 cm/s MV A velocity: 73.30 cm/s MV E/A ratio:  0.69 Dorothye Gathers MD Electronically signed by Dorothye Gathers MD Signature Date/Time: 01/04/2024/3:16:04 PM    Final    US  RENAL Result Date: 01/04/2024 CLINICAL DATA:  Acute renal failure EXAM: RENAL / URINARY TRACT ULTRASOUND COMPLETE COMPARISON:  Abdominal CT 04/07/2021 FINDINGS: Right Kidney: Renal measurements: 10 x 5.4 x 5.8 cm = volume: 160 mL. Mildly echogenic cortex with corticomedullary prominence. No mass or hydronephrosis visualized. Left Kidney: Renal measurements: 9 x 5 x 5 cm = volume: 120 mL. Mildly echogenic cortex with corticomedullary prominence. 5 mm stone with twinkle artifact, nonobstructive. Mild hydronephrosis. Bladder: Full urinary bladder with some internal echoes attributed to debris.  Jets were not seen on either side. Other: Bilateral pleural effusion which is simple where covered. IMPRESSION: 1. Mild left hydronephrosis.Left nephrolithiasis. 2. Medical renal disease with mild atrophy on the left. 3. Moderate distension of the urinary bladder which contains some debris, correlate with urinalysis. Electronically Signed   By: Ronnette Coke M.D.   On: 01/04/2024 07:59   DG Chest Portable 1 View Result Date: 01/04/2024 CLINICAL DATA:  Shortness of breath. EXAM: PORTABLE CHEST 1 VIEW COMPARISON:  07/23/2022 FINDINGS: The heart size appears enlarged which may be exacerbated by portable technique. There are small bilateral pleural effusions left greater than right. Increase interstitial markings are noted bilaterally. Right upper and bilateral lower lobe airspace opacities noted. Visualized osseous structures appear grossly intact. IMPRESSION: 1. Cardiomegaly, small bilateral pleural effusions and increased interstitial markings compatible with congestive heart failure. 2. Right upper and bilateral lower lobe airspace opacities may reflect edema or pneumonia. Electronically Signed   By: Kimberley Penman M.D.   On: 01/04/2024 05:44    ASSESSMENT & PLAN Connor Solomon 63 y.o. male with medical history significant for Waldenstrom's macroglobulinemia who presents for a follow up visit.   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  There is no clear evidence of hyperviscosity syndrome  at this time.  At this time we will plan to proceed with ibrutinib  and rituximab  therapy.  One could also consider Bendamustine and rituximab  therapy but given the patient's degree of anemia I would prefer pursuing ibrutinib  and rituximab  at this time.  Additionally ibrutinib /rituximab  are category 1 recommendations per the NCCN guidelines.  The treatment will consist of  ibrutinib  420 mg p.o. daily with rituximab  on weeks 1 through 4 as well as weeks 27 through 30.  Previously we discussed the risks and benefits of therapy including (but not limited to) risk of bleeding, fatigue, atypical infections, and cardiac arrhythmias.  The patient voices understanding of this plan moving forward.   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 --Started second round of weekly rituximab  on 12/02/2021. Continued weekly x 4 weeks.   --last rituximab  dose held due to missed visits/illness.  Plan: --Labs today were reviewed without any intervention needed.  Labs show white cell count 3.5, hemoglobin 13.9, platelets 184. Pending SPEP and sFLC levels but prior levels appear stable. --Currently on ibrutinib  420 mg PO daily.  --RTC in 4 weeks with labs   # Lesion on Back Of Head- resolved  -- Patient had a ping-pong ball sized lesion on the back of his head.  It appeared to be draining and has some dried blood and crusting. -- Patient evaluated by Dr. Myrtie Atkinson in dermatology.  Lesion was biopsied and found not to be cancerous. -- Appears to be healing well and improving greatly.  Continue per Dr. Christiane Cowing recommendations.  # Rash-improved markedly -- Affecting hands bilaterally, upper extremities bilaterally, and back.  Not affecting face, chest, or lower extremities. -- Patient has dryness and cracking as well as erythema on the upper extremities. -- Etiology is unclear.  Patient is using hydrating creams but is not effective.  Made referral to dermatology for evaluation, he is established with Dr. Louana Roup.  #Pain Management --Pain was poorly controlled on hydrocodone  therapy  previously.  --Patient was previously admitted for for opioid overdose with presumed fentanyl  --Currently pain regimen includes methadone  10 mg TID and oxycodone  10 mg PRN for breakthrough pain per palliative care. Patient reports pain is worsening and admits to taking double dose of oxycodone  occasionally. Rates pain as 7/10. -- Appreciate recommendations of palliative care.  #Normocytic Anemia  --Hgb 12.5, improved --previously there was a component of iron  deficiency anemia as well.  Currently on PO ferrous sulfate . --Continue to monitor.  #Nausea and vomiting--stable -- Concern that the nausea and vomiting may not be related to the ibrutinib  therapy and potentially tied to poor gastric motility due to opioid prescription.  --Unable to tolerate olanzopine so discontinued --Not taking metoclopramide  10 mg every 8 hours  --continue zofran  PRN.   #Fatigue #Anxiety/Depression: --Fatigue and depression have worsened recently --Anxiety has improved with Valium   --Will request further evaluation by palliative care team  #Supportive Care -- chemotherapy education complete -- port placement not required   No orders of the defined types were placed in this encounter.  All questions were answered. The patient knows to call the clinic with any problems, questions or concerns.  I have spent a total of 30 minutes minutes of face-to-face and non-face-to-face time, preparing to see the patient,  performing a medically appropriate examination, counseling  and educating the patient, ordering medications/tests, referring and communicating with other health care professionals, documenting clinical information in the electronic health record, and care coordination.   Rogerio Clay, MD Department of Hematology/Oncology [facility] Phone: [phone] Pager: [phone] Email: [email or web address]    01/30/2024 3:45 PM  Dimopoulos MA, Raechel Bulla,  Trotman J, Garca-Sanz R, Macdonald D, Leblond V, Mahe B, Herbaux C, Tam C, Orsucci L, Palomba ML, Matous JV, Charleston C, Kastritis E, Treon SP, Li J, Salman Z, Graef T, Buske C; [employer]OV[employer] Study Group and the [employer]. Phase 3 Trial of Ibrutinib  plus Rituximab  in Waldenstrm's Macroglobulinemia. Ole [employer] J Med. 2018 Jun 21;378(25):2399-2410.   --At 30 months, the progression-free survival rate was 82% with ibrutinib -rituximab  versus 28% with placebo-rituximab  (hazard ratio for progression or death, 0.20; P<0.001).

## 2024-01-31 ENCOUNTER — Other Ambulatory Visit (HOSPITAL_COMMUNITY): Payer: Self-pay

## 2024-01-31 ENCOUNTER — Other Ambulatory Visit: Payer: Self-pay

## 2024-01-31 ENCOUNTER — Telehealth: Payer: Self-pay | Admitting: Pharmacy Technician

## 2024-01-31 LAB — KAPPA/LAMBDA LIGHT CHAINS
Kappa free light chain: 25.8 mg/L — ABNORMAL HIGH (ref 3.3–19.4)
Kappa, lambda light chain ratio: 3.15 — ABNORMAL HIGH (ref 0.26–1.65)
Lambda free light chains: 8.2 mg/L (ref 5.7–26.3)

## 2024-01-31 MED ORDER — OXYCODONE HCL 10 MG PO TABS
10.0000 mg | ORAL_TABLET | ORAL | 0 refills | Status: DC | PRN
  Filled 2024-01-31: qty 90, 15d supply, fill #0

## 2024-01-31 MED ORDER — METHADONE HCL 10 MG PO TABS
10.0000 mg | ORAL_TABLET | Freq: Three times a day (TID) | ORAL | 0 refills | Status: DC
  Filled 2024-01-31: qty 10, 4d supply, fill #0
  Filled 2024-01-31: qty 80, 26d supply, fill #0

## 2024-01-31 NOTE — Progress Notes (Signed)
 Palliative Medicine [facility]  Telephone:(336) [phone] Fax:(336) [phone]   Name: Connor Solomon Date: 01/31/2024 MRN: [id number]  DOB: 1961/08/16  Patient Care Team: Aurora Lees, DO as PCP - General Bensimhon, Rheta Celestine, MD as PCP - Cardiology (Cardiology) Pickenpack-Cousar, Giles Labrum, NP as Nurse Practitioner (Nurse Practitioner)   REASON FOR CONSULTATION: HADY NIEMCZYK is a 63 y.o. male with medical history including Waldenstrom's Macroglobulinemia s/p cycle 7 Ibrutinib  and Rituxaimab, hypertension and heart failure. Palliative ask to see for symptom management.  SOCIAL HISTORY:     reports that he quit smoking about 3 years ago. His smoking use included cigars. He has never used smokeless tobacco. He reports that he does not drink alcohol and does not use drugs.  ADVANCE DIRECTIVES:    CODE STATUS:   PAST MEDICAL HISTORY: Past Medical History:  Diagnosis Date   Acute heart failure (HCC) 04/08/2021   Acute HFrEF (heart failure with reduced ejection fraction) (HCC) 04/07/2021   Arthritis    hands, elbows bilaterally   Cancer (HCC)    Full dentures    Hypertension    Pt states he doen not have HTN and has never been treated for HTN   Kidney stone on left side last stone 4-5 yrs ago   recurrent (3 episodes)   Panic disorder    pt states has resolved   Syncope    resolved- was related to panic attacks    PAST SURGICAL HISTORY:  Past Surgical History:  Procedure Laterality Date   CARPAL METACARPAL FUSION WITH DISTAL RADIAL BONE GRAFT Left 05/30/2013   Procedure: LEFT THUMB METACARPAL JOINT FUSION;  Surgeon: Hedy Living, MD;  Location: [facility];  Service: Orthopedics;  Laterality: Left;   ESOPHAGOGASTRODUODENOSCOPY N/A 02/12/2014   Procedure: ESOPHAGOGASTRODUODENOSCOPY (EGD);  Surgeon: Nannette Babe, MD;  Location: [facility] ENDOSCOPY;  Service: Endoscopy;  Laterality: N/A;   FOREIGN BODY REMOVAL N/A 02/12/2014   Procedure: FOREIGN  BODY REMOVAL;  Surgeon: Nannette Babe, MD;  Location: [facility];  Service: Endoscopy;  Laterality: N/A;   OPEN REDUCTION INTERNAL FIXATION (ORIF) DISTAL RADIAL FRACTURE Left 11/29/2012   Procedure: OPEN REDUCTION INTERNAL FIXATION (ORIF) DISTAL RADIAL FRACTURE;  Surgeon: Hedy Living, MD;  Location: [facility];  Service: Orthopedics;  Laterality: Left;  LEFT DISTAL RADIUS OSTEOTOMY WITH BONE GRAFT   RIGHT/LEFT HEART CATH AND CORONARY ANGIOGRAPHY N/A 04/09/2021   Procedure: RIGHT/LEFT HEART CATH AND CORONARY ANGIOGRAPHY;  Surgeon: Mardell Shade, MD;  Location: [facility];  Service: Cardiovascular;  Laterality: N/A;   TENDON REPAIR Left 02/07/2013   Procedure: LEFT EXTENSOR INDICIS PROPRIUS  TO EXTENSOR POLLICIS LONGUS TENDON TRANSFER;  Surgeon: Hedy Living, MD;  Location: [facility];  Service: Orthopedics;  Laterality: Left;   WISDOM TOOTH EXTRACTION     WRIST SURGERY     fx only    HEMATOLOGY/ONCOLOGY HISTORY:  Oncology History  Waldenstrom macroglobulinemia  05/24/2021 Initial Diagnosis   Waldenstrom macroglobulinemia (HCC)   06/05/2021 - 12/30/2021 Chemotherapy   Patient is on Treatment Plan : NON-HODGKINS LYMPHOMA Rituximab  q7d       ALLERGIES:  is allergic to rituxan  [rituximab ] and hydrocodone .  MEDICATIONS:  Current Outpatient Medications  Medication Sig Dispense Refill   acetaminophen  (TYLENOL ) 500 MG tablet Take 500 mg by mouth every 6 (six) hours as needed.     albuterol  (PROAIR  HFA) 108 (90 Base) MCG/ACT inhaler Inhale 2 puffs into the lungs every 6 (  six) hours as needed for wheezing or shortness of breath. 6.7 g 2   aspirin  EC 325 MG tablet Take 1 tablet (325 mg total) by mouth daily. 30 tablet 0   carvedilol  (COREG ) 3.125 MG tablet Take 1 tablet (3.125 mg total) by mouth 2 (two) times daily. NEEDS FOLLOW UP APPOINTMENT FOR MORE REFILLS 60 tablet 0   chlorhexidine  (HIBICLENS ) 4 % external liquid Apply topically daily as  needed. Dilute with water and clean wound with this.  Rinse thoroughly afterwards (Patient not taking: Reported on 01/04/2024) 237 mL 0   clobetasol  cream (TEMOVATE ) 0.05 % Apply 1 Application topically 2 (two) times daily. Apply topically to the arms and hands twice daily for up to two weeks 30 g 2   colchicine  0.6 MG tablet Take 1 tablet (0.6 mg total) by mouth 2 (two) times daily. 180 tablet 0   dapagliflozin  propanediol (FARXIGA ) 10 MG TABS tablet Take 1 tablet (10 mg total) by mouth daily before breakfast. NEEDS FOLLOW UP APPOINTMENT FOR ANYMORE REFILLS 30 tablet 3   diazepam  (VALIUM ) 5 MG tablet Take 1 tablet (5 mg total) by mouth every 12 (twelve) hours as needed for anxiety. 60 tablet 0   DULoxetine  (CYMBALTA ) 30 MG capsule Take 1 capsule (30 mg total) by mouth daily. 30 capsule 0   eplerenone  (INSPRA ) 25 MG tablet Take 0.5 tablets (12.5 mg total) by mouth daily. NEEDS FOLLOW UP APPOINTMENT FOR MORE REFILLS 15 tablet 0   famotidine  (PEPCID ) 20 MG tablet Take 1 tablet (20 mg total) by mouth at bedtime. 90 tablet 0   ferrous sulfate  (FEROSUL) 325 (65 FE) MG tablet Take 1 tablet by mouth daily with breakfast. Please take with a source of Vitamin C 90 tablet 3   furosemide  (LASIX ) 40 MG tablet Take 1 tablet (40 mg total) by mouth daily as needed for fluid or edema. 30 tablet 0   ibrutinib  (IMBRUVICA ) 420 MG tablet Take 1 tablet (420 mg total) by mouth daily. 28 tablet 2   methadone  (DOLOPHINE ) 10 MG tablet Take 1 tablet (10 mg total) by mouth every 8 (eight) hours. 90 tablet 0   naloxone  (NARCAN ) nasal spray 4 mg/0.1 mL Take for overdose 1 each 0   naloxone  (NARCAN ) nasal spray 4 mg/0.1 mL Take for overdose as directed 2 each 0   ondansetron  (ZOFRAN ) 8 MG tablet Take 1 tablet (8 mg) by mouth every 8 hours as needed. 30 tablet 0   Oxycodone  HCl 10 MG TABS Take 1 tablet (10 mg total) by mouth every 4 (four) hours as needed. 90 tablet 0   sacubitril -valsartan  (ENTRESTO ) 24-26 MG Take 1 tablet by  mouth 2 (two) times daily. 60 tablet 0   sildenafil  (VIAGRA ) 50 MG tablet Take 1 tablet (50 mg total) by mouth daily as needed for erectile dysfunction. 20 tablet 0   No current facility-administered medications for this visit.    VITAL SIGNS: BP 115/88 (BP Location: Left Arm, Patient Position: Sitting)   Pulse 85   Temp (!) 97.2 F (36.2 C) (Temporal)   Resp 18   Wt 119 lb 1.6 oz (54 kg)   SpO2 100%   BMI 18.11 kg/m  Filed Weights   01/30/24 1411  Weight: 119 lb 1.6 oz (54 kg)       Estimated body mass index is 18.11 kg/m as calculated from the following:   Height as of 01/04/24: 5\' 8"  (1.727 m).   Weight as of this encounter: 119 lb 1.6 oz (54 kg).  Drugs of Abuse     Component Value Date/Time   LABOPIA NONE DETECTED 01/30/2024 1430   COCAINSCRNUR NONE DETECTED 01/30/2024 1430   LABBENZ POSITIVE (A) 01/30/2024 1430   AMPHETMU POSITIVE (A) 01/30/2024 1430   THCU POSITIVE (A) 01/30/2024 1430   LABBARB NONE DETECTED 01/30/2024 1430     PERFORMANCE STATUS (ECOG) : 1 - Symptomatic but completely ambulatory  Assessment NAD RRR Normal breathing pattern  AAO X4  IMPRESSION:  Mr. Bezek presents to clinic for follow-up. Recent hospitalization due CHF exacerbation. Is complaining of ongoing chest tightness and dyspnea with exertion which he attributes to recent exacerbation. Denies worsening symptoms. No concerns of nausea, vomiting, constipation, or diarrhea.   Mr. Chavira unfortunately missed the past several appointments and due to this pain medication refills were placed on hold. He is complaining of pain all over with worst pain in hips, knees and back. Current regimen controls his pain. No adjustments made at this time. We will continue to closely monitor. Patient reminded of pain contract and importance of keeping all appointments. He verbalized understanding.   All questions answered and support provided.   Goals of care   7/10: I created space and opportunity for  Mr. Ferreri to share his thoughts and feelings regarding his current health and condition. He is realistic speaking to his understanding of Christian faith. He is taking things one day at a time preparing himself for when his time comes. Until then he wishes to continue treating the treatable allowing himself every opportunity to thrive. He knows at anytime he can discuss with his medical team his wishes and focus on his comfort.   (5/16) Mr. Caldron shares his realistic understanding of his condition. He and his wife are making sure all "affairs" are in order as his biggest worry is leaving his wife in a financial constraint. Reports communicating with his life insurance policy etc. Emotional support provided.    He is emotional expressing racing thoughts during idol time or interruptions in his sleep as his mind wonders thinking about worst case scenario. Is requesting something to assist with sleeping and anxiety. Acknowledged his request. Education provided on limited use in collaboration with other medications including zyprexa . He verbalized understanding and aware  we will continue to support and monitor for needs.   Assessment and Plan  Chronic Cancer Related Pain Stable Waldenstrom's. Currently on Methadone  and Oxycodone  with limited relief. Patient reports possible tolerance to medications. He is aware no adjustments will be made to current regimen including his oxycodone  or methadone .  -Start Cymbalta  30 mg once daily for additional pain control, monitor for side effects.  -Continue Methadone  and Oxycodone  -Tylenol  ES every 8 hours as needed.   Waldenstrom's Macroglobulinemia Stable condition, currently on Imbruvica . -Continue Imbruvica  as prescribed per Oncology.   General Health Maintenance -Plan for regular follow-up appointments as previously scheduled. -I will plan to see patient back in clinic in 4-6 weeks.  Patient expressed understanding and was in agreement with this plan. He  also understands that He can call the clinic at any time with any questions, concerns, or complaints.    Any controlled substances utilized were prescribed in the context of palliative care. PDMP has been reviewed.    Visit consisted of counseling and education dealing with the complex and emotionally intense issues of symptom management and palliative care in the setting of serious and potentially life-threatening illness.  Dellia Ferguson, AGPCNP-BC  Palliative Medicine Team/[facility]

## 2024-01-31 NOTE — Telephone Encounter (Signed)
 Oral Oncology Patient Advocate Encounter   Test claim rejected Cost Exceeds Max. Called [employer] who processes the PAs and things for this ins. [phone]  Over-ride was put in for today's date. Test claim now shows $4 copay. Patient assistance is no longer needed.      Roda Cirri, CPhT Specialty Pharmacy Patient Advocate Phone: [phone] Fax: [phone]

## 2024-02-01 ENCOUNTER — Other Ambulatory Visit (HOSPITAL_COMMUNITY): Payer: Self-pay

## 2024-02-01 ENCOUNTER — Telehealth: Payer: Self-pay

## 2024-02-01 NOTE — Telephone Encounter (Signed)
 Oral Oncology Patient Advocate Encounter  Was successful in securing patient a $8,000.00 grant from [facility] to provide copayment coverage for Imbruvica .  This will keep the out of pocket expense at $0.     [employer] ID: [id number]   The billing information is as follows and has been shared with [facility].    RxBin: [id number] PCN: [id number] Member ID: [id number] Group ID: [id number] Dates of Eligibility: 01/02/24 through 12/31/24  Fund:  Waldenstrom Macroglobulinemia   Hansel Ley, CPhT Pharmacy Technician Coordinator [facility] [phone] (Ph) 02/01/2024 9:28 AM

## 2024-02-02 ENCOUNTER — Inpatient Hospital Stay: Payer: Medicare (Managed Care)

## 2024-02-02 ENCOUNTER — Other Ambulatory Visit (HOSPITAL_COMMUNITY): Payer: Self-pay

## 2024-02-02 ENCOUNTER — Inpatient Hospital Stay: Payer: Medicare (Managed Care) | Admitting: Hematology and Oncology

## 2024-02-02 NOTE — Telephone Encounter (Signed)
 Attempted to call pt, left HIPAA compliant VoiceMail @ [phone] & [phone] requesting a return call. Direct office number provided. Call is in regard to filling his medication at [facility] and setting up his refill calls.

## 2024-02-06 LAB — MULTIPLE MYELOMA PANEL, SERUM
Albumin SerPl Elph-Mcnc: 3.4 g/dL (ref 2.9–4.4)
Albumin/Glob SerPl: 1.7 (ref 0.7–1.7)
Alpha 1: 0.2 g/dL (ref 0.0–0.4)
Alpha2 Glob SerPl Elph-Mcnc: 0.6 g/dL (ref 0.4–1.0)
B-Globulin SerPl Elph-Mcnc: 0.8 g/dL (ref 0.7–1.3)
Gamma Glob SerPl Elph-Mcnc: 0.4 g/dL (ref 0.4–1.8)
Globulin, Total: 2.1 g/dL — ABNORMAL LOW (ref 2.2–3.9)
IgA: 101 mg/dL (ref 61–437)
IgG (Immunoglobin G), Serum: 456 mg/dL — ABNORMAL LOW (ref 603–1613)
IgM (Immunoglobulin M), Srm: 128 mg/dL (ref 20–172)
M Protein SerPl Elph-Mcnc: 0.1 g/dL — ABNORMAL HIGH
Total Protein ELP: 5.5 g/dL — ABNORMAL LOW (ref 6.0–8.5)

## 2024-02-07 NOTE — Telephone Encounter (Signed)
 Again: Attempted to call pt, left HIPAA compliant VoiceMail requesting a return call. Direct office number provided. Call is in regard to filling his medication at [facility] and setting up his refill calls.

## 2024-02-09 ENCOUNTER — Encounter: Payer: Medicare (Managed Care) | Admitting: Student

## 2024-02-13 ENCOUNTER — Other Ambulatory Visit: Payer: Self-pay | Admitting: Nurse Practitioner

## 2024-02-13 ENCOUNTER — Other Ambulatory Visit (HOSPITAL_COMMUNITY): Payer: Self-pay

## 2024-02-13 ENCOUNTER — Other Ambulatory Visit (HOSPITAL_COMMUNITY): Payer: Self-pay | Admitting: Internal Medicine

## 2024-02-13 DIAGNOSIS — G893 Neoplasm related pain (acute) (chronic): Secondary | ICD-10-CM

## 2024-02-13 DIAGNOSIS — C8800 Waldenstrom macroglobulinemia not having achieved remission: Secondary | ICD-10-CM

## 2024-02-13 DIAGNOSIS — Z515 Encounter for palliative care: Secondary | ICD-10-CM

## 2024-02-13 MED ORDER — OXYCODONE HCL 10 MG PO TABS
10.0000 mg | ORAL_TABLET | ORAL | 0 refills | Status: DC | PRN
Start: 2024-02-13 — End: 2024-02-25
  Filled 2024-02-13: qty 90, 15d supply, fill #0

## 2024-02-14 ENCOUNTER — Other Ambulatory Visit (HOSPITAL_COMMUNITY): Payer: Self-pay

## 2024-02-15 ENCOUNTER — Other Ambulatory Visit: Payer: Self-pay | Admitting: *Deleted

## 2024-02-15 MED ORDER — IBRUTINIB 420 MG PO TABS
420.0000 mg | ORAL_TABLET | Freq: Every day | ORAL | 2 refills | Status: DC
  Filled 2024-02-20: qty 28, 28d supply, fill #0

## 2024-02-20 ENCOUNTER — Other Ambulatory Visit: Payer: Self-pay | Admitting: Pharmacy Technician

## 2024-02-20 ENCOUNTER — Other Ambulatory Visit (HOSPITAL_COMMUNITY): Payer: Self-pay

## 2024-02-20 ENCOUNTER — Other Ambulatory Visit: Payer: Self-pay

## 2024-02-20 ENCOUNTER — Telehealth: Payer: Self-pay

## 2024-02-20 ENCOUNTER — Telehealth: Payer: Self-pay | Admitting: Pharmacy Technician

## 2024-02-20 ENCOUNTER — Other Ambulatory Visit (HOSPITAL_BASED_OUTPATIENT_CLINIC_OR_DEPARTMENT_OTHER): Payer: Self-pay

## 2024-02-20 NOTE — Progress Notes (Signed)
 Specialty Pharmacy Initial Fill Coordination Note  Connor Solomon is a 63 y.o. male contacted today regarding refills of specialty medication(s) Ibrutinib  (IMBRUVICA ) .  Patient requested [facility] [facility] at [facility] at [place]  on 02/21/24   Medication will be filled on 02/20/24.   Patient is aware of $4 copayment. (Left him a message about this cost. Initially we had him a grant, but it is pending diagnosis verification now)

## 2024-02-20 NOTE — Telephone Encounter (Signed)
 Oral Oncology Pharmacist Encounter  Sent patient a mychart message to call our specialty pharmacy technician to be able to fill the Imbruvica . Patient called and set up fill.   Tiffanyann Deroo, PharmD Hematology/Oncology Clinical Pharmacist [facility] [phone]

## 2024-02-20 NOTE — Telephone Encounter (Signed)
 Oral Oncology Patient Advocate Encounter   Called pt's Medicaid plan to get an override for the cost exceeds max rejection. Test claim now goes through for $4. Called and left a vm to inform the pt as well, since the Norberta Beans is pending diagnosis verification.  Roda Cirri, CPhT Specialty Pharmacy Patient Advocate Phone: [phone] Fax: [phone]

## 2024-02-20 NOTE — Progress Notes (Signed)
 Patient counseled on Imbruvica  by pharmacist, Jude Norton in encounter on 05/25/2021.   Azaan Leask, PharmD Hematology/Oncology Clinical Pharmacist [facility] [phone]

## 2024-02-21 ENCOUNTER — Other Ambulatory Visit (HOSPITAL_COMMUNITY): Payer: Self-pay

## 2024-02-21 ENCOUNTER — Other Ambulatory Visit: Payer: Self-pay

## 2024-02-22 ENCOUNTER — Other Ambulatory Visit (HOSPITAL_COMMUNITY): Payer: Self-pay

## 2024-02-22 ENCOUNTER — Other Ambulatory Visit: Payer: Self-pay | Admitting: Dermatology

## 2024-02-22 ENCOUNTER — Other Ambulatory Visit: Payer: Self-pay

## 2024-02-22 DIAGNOSIS — L309 Dermatitis, unspecified: Secondary | ICD-10-CM

## 2024-02-23 ENCOUNTER — Other Ambulatory Visit (HOSPITAL_COMMUNITY): Payer: Self-pay

## 2024-02-25 ENCOUNTER — Other Ambulatory Visit: Payer: Self-pay | Admitting: Nurse Practitioner

## 2024-02-25 DIAGNOSIS — G893 Neoplasm related pain (acute) (chronic): Secondary | ICD-10-CM

## 2024-02-25 DIAGNOSIS — C8800 Waldenstrom macroglobulinemia not having achieved remission: Secondary | ICD-10-CM

## 2024-02-25 DIAGNOSIS — Z515 Encounter for palliative care: Secondary | ICD-10-CM

## 2024-02-26 MED ORDER — OXYCODONE HCL 10 MG PO TABS
10.0000 mg | ORAL_TABLET | ORAL | 0 refills | Status: DC | PRN
  Filled 2024-02-27: qty 90, 15d supply, fill #0

## 2024-02-27 ENCOUNTER — Other Ambulatory Visit (HOSPITAL_COMMUNITY): Payer: Self-pay

## 2024-03-01 ENCOUNTER — Other Ambulatory Visit: Payer: Self-pay | Admitting: Physician Assistant

## 2024-03-01 DIAGNOSIS — C8800 Waldenstrom macroglobulinemia not having achieved remission: Secondary | ICD-10-CM

## 2024-03-02 ENCOUNTER — Inpatient Hospital Stay: Payer: Medicare (Managed Care) | Attending: Hematology and Oncology

## 2024-03-02 ENCOUNTER — Encounter: Payer: Self-pay | Admitting: Nurse Practitioner

## 2024-03-02 ENCOUNTER — Inpatient Hospital Stay (HOSPITAL_BASED_OUTPATIENT_CLINIC_OR_DEPARTMENT_OTHER): Payer: Medicare (Managed Care) | Admitting: Nurse Practitioner

## 2024-03-02 ENCOUNTER — Inpatient Hospital Stay (HOSPITAL_BASED_OUTPATIENT_CLINIC_OR_DEPARTMENT_OTHER): Payer: Medicare (Managed Care) | Admitting: Physician Assistant

## 2024-03-02 ENCOUNTER — Other Ambulatory Visit (HOSPITAL_COMMUNITY): Payer: Self-pay

## 2024-03-02 VITALS — BP 105/91 | HR 79 | Temp 97.3°F | Resp 18 | Ht 68.0 in | Wt 121.3 lb

## 2024-03-02 DIAGNOSIS — I110 Hypertensive heart disease with heart failure: Secondary | ICD-10-CM | POA: Insufficient documentation

## 2024-03-02 DIAGNOSIS — R530 Neoplastic (malignant) related fatigue: Secondary | ICD-10-CM | POA: Diagnosis not present

## 2024-03-02 DIAGNOSIS — Z885 Allergy status to narcotic agent status: Secondary | ICD-10-CM | POA: Insufficient documentation

## 2024-03-02 DIAGNOSIS — Z515 Encounter for palliative care: Secondary | ICD-10-CM

## 2024-03-02 DIAGNOSIS — G893 Neoplasm related pain (acute) (chronic): Secondary | ICD-10-CM | POA: Insufficient documentation

## 2024-03-02 DIAGNOSIS — C8800 Waldenstrom macroglobulinemia not having achieved remission: Secondary | ICD-10-CM | POA: Diagnosis not present

## 2024-03-02 DIAGNOSIS — D472 Monoclonal gammopathy: Secondary | ICD-10-CM | POA: Insufficient documentation

## 2024-03-02 DIAGNOSIS — F419 Anxiety disorder, unspecified: Secondary | ICD-10-CM | POA: Insufficient documentation

## 2024-03-02 DIAGNOSIS — F17290 Nicotine dependence, other tobacco product, uncomplicated: Secondary | ICD-10-CM | POA: Insufficient documentation

## 2024-03-02 DIAGNOSIS — Z79899 Other long term (current) drug therapy: Secondary | ICD-10-CM | POA: Insufficient documentation

## 2024-03-02 DIAGNOSIS — R5383 Other fatigue: Secondary | ICD-10-CM | POA: Diagnosis not present

## 2024-03-02 DIAGNOSIS — Z9889 Other specified postprocedural states: Secondary | ICD-10-CM | POA: Insufficient documentation

## 2024-03-02 DIAGNOSIS — I5022 Chronic systolic (congestive) heart failure: Secondary | ICD-10-CM | POA: Diagnosis not present

## 2024-03-02 LAB — CBC WITH DIFFERENTIAL (CANCER CENTER ONLY)
Abs Immature Granulocytes: 0.02 10*3/uL (ref 0.00–0.07)
Basophils Absolute: 0 10*3/uL (ref 0.0–0.1)
Basophils Relative: 0 %
Eosinophils Absolute: 0.2 10*3/uL (ref 0.0–0.5)
Eosinophils Relative: 4 %
HCT: 39.4 % (ref 39.0–52.0)
Hemoglobin: 12.8 g/dL — ABNORMAL LOW (ref 13.0–17.0)
Immature Granulocytes: 0 %
Lymphocytes Relative: 19 %
Lymphs Abs: 1 10*3/uL (ref 0.7–4.0)
MCH: 26.7 pg (ref 26.0–34.0)
MCHC: 32.5 g/dL (ref 30.0–36.0)
MCV: 82.3 fL (ref 80.0–100.0)
Monocytes Absolute: 0.3 10*3/uL (ref 0.1–1.0)
Monocytes Relative: 6 %
Neutro Abs: 3.6 10*3/uL (ref 1.7–7.7)
Neutrophils Relative %: 71 %
Platelet Count: 182 10*3/uL (ref 150–400)
RBC: 4.79 MIL/uL (ref 4.22–5.81)
RDW: 17.7 % — ABNORMAL HIGH (ref 11.5–15.5)
WBC Count: 5.1 10*3/uL (ref 4.0–10.5)
nRBC: 0 % (ref 0.0–0.2)

## 2024-03-02 LAB — CMP (CANCER CENTER ONLY)
ALT: 9 U/L (ref 0–44)
AST: 17 U/L (ref 15–41)
Albumin: 3.8 g/dL (ref 3.5–5.0)
Alkaline Phosphatase: 81 U/L (ref 38–126)
Anion gap: 8 (ref 5–15)
BUN: 18 mg/dL (ref 8–23)
CO2: 26 mmol/L (ref 22–32)
Calcium: 8.5 mg/dL — ABNORMAL LOW (ref 8.9–10.3)
Chloride: 106 mmol/L (ref 98–111)
Creatinine: 1.17 mg/dL (ref 0.61–1.24)
GFR, Estimated: >60 mL/min (ref >60)
Glucose, Bld: 97 mg/dL (ref 70–99)
Potassium: 4.1 mmol/L (ref 3.5–5.1)
Sodium: 140 mmol/L (ref 135–145)
Total Bilirubin: 0.7 mg/dL (ref 0.0–1.2)
Total Protein: 6.1 g/dL — ABNORMAL LOW (ref 6.5–8.1)

## 2024-03-02 MED ORDER — METHADONE HCL 10 MG PO TABS
10.0000 mg | ORAL_TABLET | Freq: Three times a day (TID) | ORAL | 0 refills | Status: DC
Start: 2024-03-02 — End: 2024-03-22
  Filled 2024-03-02: qty 90, 30d supply, fill #0

## 2024-03-02 NOTE — Progress Notes (Signed)
 Palliative Medicine [facility]  Telephone:(336) [phone] Fax:(336) [phone]   Name: Connor Solomon Date: 03/02/2024 MRN: [id number]  DOB: 06/25/1961  Patient Care Team: Aurora Lees, DO as PCP - General Bensimhon, Rheta Celestine, MD as PCP - Cardiology (Cardiology) Pickenpack-Cousar, Giles Labrum, NP as Nurse Practitioner (Nurse Practitioner)   REASON FOR CONSULTATION: Connor Solomon is a 63 y.o. male with medical history including Waldenstrom's Macroglobulinemia s/p cycle 7 Ibrutinib  and Rituxaimab, hypertension and heart failure. Palliative ask to see for symptom management.  SOCIAL HISTORY:     reports that he quit smoking about 3 years ago. His smoking use included cigars. He has never used smokeless tobacco. He reports that he does not drink alcohol and does not use drugs.  ADVANCE DIRECTIVES:    CODE STATUS:   PAST MEDICAL HISTORY: Past Medical History:  Diagnosis Date   Acute heart failure (HCC) 04/08/2021   Acute HFrEF (heart failure with reduced ejection fraction) (HCC) 04/07/2021   Arthritis    hands, elbows bilaterally   Cancer (HCC)    Full dentures    Hypertension    Pt states he doen not have HTN and has never been treated for HTN   Kidney stone on left side last stone 4-5 yrs ago   recurrent (3 episodes)   Panic disorder    pt states has resolved   Syncope    resolved- was related to panic attacks    PAST SURGICAL HISTORY:  Past Surgical History:  Procedure Laterality Date   CARPAL METACARPAL FUSION WITH DISTAL RADIAL BONE GRAFT Left 05/30/2013   Procedure: LEFT THUMB METACARPAL JOINT FUSION;  Surgeon: Hedy Living, MD;  Location: [facility];  Service: Orthopedics;  Laterality: Left;   ESOPHAGOGASTRODUODENOSCOPY N/A 02/12/2014   Procedure: ESOPHAGOGASTRODUODENOSCOPY (EGD);  Surgeon: Nannette Babe, MD;  Location: [facility] ENDOSCOPY;  Service: Endoscopy;  Laterality: N/A;   FOREIGN BODY REMOVAL N/A 02/12/2014   Procedure: FOREIGN  BODY REMOVAL;  Surgeon: Nannette Babe, MD;  Location: [facility] ENDOSCOPY;  Service: Endoscopy;  Laterality: N/A;   OPEN REDUCTION INTERNAL FIXATION (ORIF) DISTAL RADIAL FRACTURE Left 11/29/2012   Procedure: OPEN REDUCTION INTERNAL FIXATION (ORIF) DISTAL RADIAL FRACTURE;  Surgeon: Hedy Living, MD;  Location: [facility];  Service: Orthopedics;  Laterality: Left;  LEFT DISTAL RADIUS OSTEOTOMY WITH BONE GRAFT   RIGHT/LEFT HEART CATH AND CORONARY ANGIOGRAPHY N/A 04/09/2021   Procedure: RIGHT/LEFT HEART CATH AND CORONARY ANGIOGRAPHY;  Surgeon: Mardell Shade, MD;  Location: [facility];  Service: Cardiovascular;  Laterality: N/A;   TENDON REPAIR Left 02/07/2013   Procedure: LEFT EXTENSOR INDICIS PROPRIUS  TO EXTENSOR POLLICIS LONGUS TENDON TRANSFER;  Surgeon: Hedy Living, MD;  Location: [facility];  Service: Orthopedics;  Laterality: Left;   WISDOM TOOTH EXTRACTION     WRIST SURGERY     fx only    HEMATOLOGY/ONCOLOGY HISTORY:  Oncology History  Waldenstrom macroglobulinemia  05/24/2021 Initial Diagnosis   Waldenstrom macroglobulinemia (HCC)   06/05/2021 - 12/30/2021 Chemotherapy   Patient is on Treatment Plan : NON-HODGKINS LYMPHOMA Rituximab  q7d       ALLERGIES:  is allergic to rituxan  [rituximab ] and hydrocodone .  MEDICATIONS:  Current Outpatient Medications  Medication Sig Dispense Refill   acetaminophen  (TYLENOL ) 500 MG tablet Take 500 mg by mouth every 6 (six) hours as needed.     albuterol  (PROAIR  HFA) 108 (90 Base) MCG/ACT inhaler Inhale 2 puffs into the lungs every 6 (  six) hours as needed for wheezing or shortness of breath. 6.7 g 2   aspirin  EC 325 MG tablet Take 1 tablet (325 mg total) by mouth daily. 30 tablet 0   carvedilol  (COREG ) 3.125 MG tablet Take 1 tablet (3.125 mg total) by mouth 2 (two) times daily. NEEDS FOLLOW UP APPOINTMENT FOR MORE REFILLS 60 tablet 0   chlorhexidine  (HIBICLENS ) 4 % external liquid Apply topically daily as  needed. Dilute with water and clean wound with this.  Rinse thoroughly afterwards (Patient not taking: Reported on 01/04/2024) 237 mL 0   clobetasol  cream (TEMOVATE ) 0.05 % Apply 1 Application topically 2 (two) times daily. Apply topically to the arms and hands twice daily for up to two weeks 30 g 2   colchicine  0.6 MG tablet Take 1 tablet (0.6 mg total) by mouth 2 (two) times daily. 180 tablet 0   dapagliflozin  propanediol (FARXIGA ) 10 MG TABS tablet Take 1 tablet (10 mg total) by mouth daily before breakfast. NEEDS FOLLOW UP APPOINTMENT FOR ANYMORE REFILLS 30 tablet 3   diazepam  (VALIUM ) 5 MG tablet Take 1 tablet (5 mg total) by mouth every 12 (twelve) hours as needed for anxiety. 60 tablet 0   DULoxetine  (CYMBALTA ) 30 MG capsule Take 1 capsule (30 mg total) by mouth daily. 30 capsule 0   eplerenone  (INSPRA ) 25 MG tablet Take 0.5 tablets (12.5 mg total) by mouth daily. NEEDS FOLLOW UP APPOINTMENT FOR MORE REFILLS 15 tablet 0   famotidine  (PEPCID ) 20 MG tablet Take 1 tablet (20 mg total) by mouth at bedtime. 90 tablet 0   ferrous sulfate  (FEROSUL) 325 (65 FE) MG tablet Take 1 tablet by mouth daily with breakfast. Please take with a source of Vitamin C 90 tablet 3   furosemide  (LASIX ) 40 MG tablet Take 1 tablet (40 mg total) by mouth daily as needed for fluid or edema. 30 tablet 0   ibrutinib  (IMBRUVICA ) 420 MG tablet Take 1 tablet (420 mg total) by mouth daily. 28 tablet 2   methadone  (DOLOPHINE ) 10 MG tablet Take 1 tablet (10 mg total) by mouth every 8 (eight) hours. 90 tablet 0   naloxone  (NARCAN ) nasal spray 4 mg/0.1 mL Take for overdose 1 each 0   naloxone  (NARCAN ) nasal spray 4 mg/0.1 mL Take for overdose as directed 2 each 0   ondansetron  (ZOFRAN ) 8 MG tablet Take 1 tablet (8 mg) by mouth every 8 hours as needed. 30 tablet 0   Oxycodone  HCl 10 MG TABS Take 1 tablet (10 mg total) by mouth every 4 (four) hours as needed. 90 tablet 0   sacubitril -valsartan  (ENTRESTO ) 24-26 MG Take 1 tablet by  mouth 2 (two) times daily. 60 tablet 0   sildenafil  (VIAGRA ) 50 MG tablet Take 1 tablet (50 mg total) by mouth daily as needed for erectile dysfunction. 20 tablet 0   No current facility-administered medications for this visit.    VITAL SIGNS: There were no vitals taken for this visit. There were no vitals filed for this visit.      Estimated body mass index is 18.44 kg/m as calculated from the following:   Height as of an earlier encounter on 03/02/24: 5\' 8"  (1.727 m).   Weight as of an earlier encounter on 03/02/24: 121 lb 4.8 oz (55 kg).  Drugs of Abuse     Component Value Date/Time   LABOPIA NONE DETECTED 01/30/2024 1430   COCAINSCRNUR NONE DETECTED 01/30/2024 1430   LABBENZ POSITIVE (A) 01/30/2024 1430   AMPHETMU POSITIVE (A)  01/30/2024 1430   THCU POSITIVE (A) 01/30/2024 1430   LABBARB NONE DETECTED 01/30/2024 1430     PERFORMANCE STATUS (ECOG) : 1 - Symptomatic but completely ambulatory  Assessment NAD RRR Normal breathing pattern  AAO X4  IMPRESSION:  Mr. Muckey presents to clinic for follow-up. No acute distress. Shares his wife has been recently hospitalized with gallbladder issues. Denies concerns of nausea, vomiting, constipation, or diarrhea. Occasional fatigue however pushes himself to do things. Spends time outside. Appetite is good.   Current regimen controls his pain. Currently taking methadone  every 8 hours and oxycodone  as needed for breakthrough pain. He is not taking cymbalta  regularly as prescribed. No adjustments made at this time. We will continue to closely monitor. Patient reminded of pain contract and importance of keeping all appointments. He verbalized understanding.   All questions answered and support provided.   Goals of care   7/10: I created space and opportunity for Mr. Sleeth to share his thoughts and feelings regarding his current health and condition. He is realistic speaking to his understanding of Christian faith. He is taking things  one day at a time preparing himself for when his time comes. Until then he wishes to continue treating the treatable allowing himself every opportunity to thrive. He knows at anytime he can discuss with his medical team his wishes and focus on his comfort.   (5/16) Mr. Kohles shares his realistic understanding of his condition. He and his wife are making sure all "affairs" are in order as his biggest worry is leaving his wife in a financial constraint. Reports communicating with his life insurance policy etc. Emotional support provided.    He is emotional expressing racing thoughts during idol time or interruptions in his sleep as his mind wonders thinking about worst case scenario. Is requesting something to assist with sleeping and anxiety. Acknowledged his request. Education provided on limited use in collaboration with other medications including zyprexa . He verbalized understanding and aware  we will continue to support and monitor for needs.   Assessment and Plan  Chronic Cancer Related Pain Stable Waldenstrom's. Currently on Methadone  and Oxycodone  with limited relief. Patient reports possible tolerance to medications. He is aware no adjustments will be made to current regimen including his oxycodone  or methadone .  -Continue Methadone  and Oxycodone  -Tylenol  ES every 8 hours as needed.   Waldenstrom's Macroglobulinemia Stable condition, currently on Imbruvica . -Continue Imbruvica  as prescribed per Oncology.   General Health Maintenance -Plan for regular follow-up appointments as previously scheduled. -I will plan to see patient back in clinic in 4-6 weeks.  Patient expressed understanding and was in agreement with this plan. He also understands that He can call the clinic at any time with any questions, concerns, or complaints.    Any controlled substances utilized were prescribed in the context of palliative care. PDMP has been reviewed.    Visit consisted of counseling and education  dealing with the complex and emotionally intense issues of symptom management and palliative care in the setting of serious and potentially life-threatening illness.  Dellia Ferguson, AGPCNP-BC  Palliative Medicine Team/ Cancer Center

## 2024-03-05 LAB — MULTIPLE MYELOMA PANEL, SERUM
Albumin SerPl Elph-Mcnc: 3.3 g/dL (ref 2.9–4.4)
Albumin/Glob SerPl: 1.6 (ref 0.7–1.7)
Alpha 1: 0.3 g/dL (ref 0.0–0.4)
Alpha2 Glob SerPl Elph-Mcnc: 0.7 g/dL (ref 0.4–1.0)
B-Globulin SerPl Elph-Mcnc: 0.8 g/dL (ref 0.7–1.3)
Gamma Glob SerPl Elph-Mcnc: 0.4 g/dL (ref 0.4–1.8)
Globulin, Total: 2.1 g/dL — ABNORMAL LOW (ref 2.2–3.9)
IgA: 78 mg/dL (ref 61–437)
IgG (Immunoglobin G), Serum: 376 mg/dL — ABNORMAL LOW (ref 603–1613)
IgM (Immunoglobulin M), Srm: 125 mg/dL (ref 20–172)
M Protein SerPl Elph-Mcnc: 0.2 g/dL — ABNORMAL HIGH
Total Protein ELP: 5.4 g/dL — ABNORMAL LOW (ref 6.0–8.5)

## 2024-03-06 ENCOUNTER — Other Ambulatory Visit (HOSPITAL_COMMUNITY): Payer: Self-pay

## 2024-03-06 LAB — KAPPA/LAMBDA LIGHT CHAINS
Kappa free light chain: 29.2 mg/L — ABNORMAL HIGH (ref 3.3–19.4)
Kappa, lambda light chain ratio: 3.65 — ABNORMAL HIGH (ref 0.26–1.65)
Lambda free light chains: 8 mg/L (ref 5.7–26.3)

## 2024-03-06 NOTE — Progress Notes (Addendum)
 [facility] [phone]   [phone]  PROGRESS NOTE  Patient Care Team: Aurora Lees, DO as PCP - General Bensimhon, Rheta Celestine, MD as PCP - Cardiology (Cardiology) Pickenpack-Cousar, Giles Labrum, NP as Nurse Practitioner (Nurse Practitioner)  Hematological/Oncological History # Waldenstrom's Macroglobulinemia # IgM Monoclonal Gammopathy 04/07/2021: CT A/p showed mildly enlarged retroperitoneal lymph nodes and bilateral inguinal lymph nodes.  04/08/2021: IgM Kappa M protein 1.2 04/09/2021: WBC 11.1, Hgb 9.5, MCV 75.4, Plt 525 04/20/2021: Kappa 977, Lambda 7.8, K/L ratio 125.28. Serum viscosity 1.8 05/06/2021: Establish care with Dr. 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 Infusion reaction with ritux, held halfway through.  06/12/2021: Cycle 2 Day 1 of Ibrutinib  + Rituximab  06/19/2021: Cycle 3 Day 1 of Ibrutinib  + Rituximab  06/26/2021: Cycle 4 Day 1 of rituximab . Continued on daily ibrutinib  12/02/2021: Cycle 5 Day 1 of Ibrutinib  + Rituximab  12/09/2021: Cycle 6 Day 1 of Ibrutinib  + Rituximab  12/16/2021: Cycle 7 Day 1 of Ibrutinib  + Rituximab  12/23/2021: Held Cycle 8 of Ritxumab due to nausea/vomiting, elevated creatinine, finger infection  Interval History:  Connor Solomon 63 y.o. male with medical history significant for Waldenstrom's macroglobulinemia who presents for a follow up visit. The patient's last visit was on 01/30/2024. In the interim since the last visit he has continued on Ibrutinib  therapy.  On exam today [name] reports he has been doing well since the last visit. He has noticed some more fatigue than his baseline but continues to complete his ADLs on his own. He denies any appetite changes. He reports that his leg edema has improved and he takes lasix  as needed  with relief. He denies nausea, vomiting or bowel habit changes. He has noticed increased dyspnea on exertion and has used his wife's supplemental oxygen at night time. He uses about 2 L of oxygen per night. He denies easy bruising or signs of bleeding. He denies fevers, chills, sweats, chest pain or cough. He has no other complaints. Full 10 point ROS is listed below.   MEDICAL HISTORY:  Past Medical History:  Diagnosis Date   Acute heart failure (HCC) 04/08/2021   Acute HFrEF (heart failure with reduced ejection fraction) (HCC) 04/07/2021   Arthritis    hands, elbows bilaterally   Cancer (HCC)    Full dentures    Hypertension    Pt states he doen not have HTN and has never been treated for HTN   Kidney stone on left side last stone 4-5 yrs ago   recurrent (3 episodes)   Panic disorder    pt states has resolved   Syncope    resolved- was related to panic attacks    SURGICAL HISTORY: Past Surgical History:  Procedure Laterality Date   CARPAL METACARPAL FUSION WITH DISTAL RADIAL BONE GRAFT Left 05/30/2013   Procedure: LEFT THUMB METACARPAL JOINT FUSION;  Surgeon: Hedy Living, MD;  Location: [facility];  Service: Orthopedics;  Laterality: Left;   ESOPHAGOGASTRODUODENOSCOPY N/A 02/12/2014   Procedure: ESOPHAGOGASTRODUODENOSCOPY (EGD);  Surgeon: Nannette Babe, MD;  Location: [facility] ENDOSCOPY;  Service: Endoscopy;  Laterality: N/A;   FOREIGN BODY REMOVAL N/A 02/12/2014   Procedure: FOREIGN BODY REMOVAL;  Surgeon: Nannette Babe, MD;  Location: [facility] ENDOSCOPY;  Service: Endoscopy;  Laterality: N/A;   OPEN REDUCTION INTERNAL FIXATION (ORIF) DISTAL RADIAL FRACTURE Left 11/29/2012   Procedure: OPEN REDUCTION INTERNAL  FIXATION (ORIF) DISTAL RADIAL FRACTURE;  Surgeon: Hedy Living, MD;  Location: [facility];  Service: Orthopedics;  Laterality: Left;  LEFT DISTAL RADIUS OSTEOTOMY WITH BONE GRAFT   RIGHT/LEFT HEART CATH AND CORONARY ANGIOGRAPHY N/A 04/09/2021    Procedure: RIGHT/LEFT HEART CATH AND CORONARY ANGIOGRAPHY;  Surgeon: Mardell Shade, MD;  Location: [facility];  Service: Cardiovascular;  Laterality: N/A;   TENDON REPAIR Left 02/07/2013   Procedure: LEFT EXTENSOR INDICIS PROPRIUS  TO EXTENSOR POLLICIS LONGUS TENDON TRANSFER;  Surgeon: Hedy Living, MD;  Location: [facility];  Service: Orthopedics;  Laterality: Left;   WISDOM TOOTH EXTRACTION     WRIST SURGERY     fx only    SOCIAL HISTORY: Social History   Socioeconomic History   Marital status: Single    Spouse name: Not on file   Number of children: Not on file   Years of education: Not on file   Highest education level: Not on file  Occupational History   Not on file  Tobacco Use   Smoking status: Former    Types: Cigars    Quit date: 10/11/2020    Years since quitting: 3.4   Smokeless tobacco: Never   Tobacco comments:    smokes 1 cigar a week  Vaping Use   Vaping status: Never Used  Substance and Sexual Activity   Alcohol use: No   Drug use: No   Sexual activity: Not on file  Other Topics Concern   Not on file  Social History Narrative   Not on file   Social Drivers of Health   Financial Resource Strain: High Risk (07/17/2021)   Overall Financial Resource Strain (CARDIA)    Difficulty of Paying Living Expenses: Very hard  Food Insecurity: No Food Insecurity (01/04/2024)   Hunger Vital Sign    Worried About Running Out of Food in the Last Year: Never true    Ran Out of Food in the Last Year: Never true  Transportation Needs: No Transportation Needs (01/04/2024)   PRAPARE - Administrator, Civil Service (Medical): No    Lack of Transportation (Non-Medical): No  Physical Activity: Not on file  Stress: Stress Concern Present (09/01/2021)   [employer] of Occupational Health - Occupational Stress Questionnaire    Feeling of Stress : Very much  Social Connections: Not on file  Intimate Partner Violence: Not At Risk  (01/04/2024)   Humiliation, Afraid, Rape, and Kick questionnaire    Fear of Current or Ex-Partner: No    Emotionally Abused: No    Physically Abused: No    Sexually Abused: No    FAMILY HISTORY: Family History  Problem Relation Age of Onset   Heart attack Father    Sudden Cardiac Death Father 67   Diabetes Neg Hx    Hyperlipidemia Neg Hx    Hypertension Neg Hx     ALLERGIES:  is allergic to rituxan  [rituximab ] and hydrocodone .  MEDICATIONS:  Current Outpatient Medications  Medication Sig Dispense Refill   acetaminophen  (TYLENOL ) 500 MG tablet Take 500 mg by mouth every 6 (six) hours as needed.     albuterol  (PROAIR  HFA) 108 (90 Base) MCG/ACT inhaler Inhale 2 puffs into the lungs every 6 (six) hours as needed for wheezing or shortness of breath. 6.7 g 2   aspirin  EC 325 MG tablet Take 1 tablet (325 mg total) by mouth daily. 30 tablet 0   carvedilol  (COREG ) 3.125 MG tablet Take 1 tablet (  3.125 mg total) by mouth 2 (two) times daily. NEEDS FOLLOW UP APPOINTMENT FOR MORE REFILLS 60 tablet 0   chlorhexidine  (HIBICLENS ) 4 % external liquid Apply topically daily as needed. Dilute with water and clean wound with this.  Rinse thoroughly afterwards (Patient not taking: Reported on 01/04/2024) 237 mL 0   clobetasol  cream (TEMOVATE ) 0.05 % Apply 1 Application topically 2 (two) times daily. Apply topically to the arms and hands twice daily for up to two weeks 30 g 2   colchicine  0.6 MG tablet Take 1 tablet (0.6 mg total) by mouth 2 (two) times daily. 180 tablet 0   dapagliflozin  propanediol (FARXIGA ) 10 MG TABS tablet Take 1 tablet (10 mg total) by mouth daily before breakfast. NEEDS FOLLOW UP APPOINTMENT FOR ANYMORE REFILLS 30 tablet 3   diazepam  (VALIUM ) 5 MG tablet Take 1 tablet (5 mg total) by mouth every 12 (twelve) hours as needed for anxiety. 60 tablet 0   DULoxetine  (CYMBALTA ) 30 MG capsule Take 1 capsule (30 mg total) by mouth daily. 30 capsule 0   eplerenone  (INSPRA ) 25 MG tablet Take 0.5  tablets (12.5 mg total) by mouth daily. NEEDS FOLLOW UP APPOINTMENT FOR MORE REFILLS 15 tablet 0   famotidine  (PEPCID ) 20 MG tablet Take 1 tablet (20 mg total) by mouth at bedtime. 90 tablet 0   ferrous sulfate  (FEROSUL) 325 (65 FE) MG tablet Take 1 tablet by mouth daily with breakfast. Please take with a source of Vitamin C 90 tablet 3   furosemide  (LASIX ) 40 MG tablet Take 1 tablet (40 mg total) by mouth daily as needed for fluid or edema. 30 tablet 0   ibrutinib  (IMBRUVICA ) 420 MG tablet Take 1 tablet (420 mg total) by mouth daily. 28 tablet 2   methadone  (DOLOPHINE ) 10 MG tablet Take 1 tablet (10 mg total) by mouth every 8 (eight) hours. 90 tablet 0   naloxone  (NARCAN ) nasal spray 4 mg/0.1 mL Take for overdose 1 each 0   naloxone  (NARCAN ) nasal spray 4 mg/0.1 mL Take for overdose as directed 2 each 0   ondansetron  (ZOFRAN ) 8 MG tablet Take 1 tablet (8 mg) by mouth every 8 hours as needed. 30 tablet 0   Oxycodone  HCl 10 MG TABS Take 1 tablet (10 mg total) by mouth every 4 (four) hours as needed. 90 tablet 0   sacubitril -valsartan  (ENTRESTO ) 24-26 MG Take 1 tablet by mouth 2 (two) times daily. 60 tablet 0   sildenafil  (VIAGRA ) 50 MG tablet Take 1 tablet (50 mg total) by mouth daily as needed for erectile dysfunction. 20 tablet 0   No current facility-administered medications for this visit.    REVIEW OF SYSTEMS:   Constitutional: ( - ) fevers, ( - )  chills , ( - ) night sweats Eyes: ( - ) blurriness of vision, ( - ) double vision, ( - ) watery eyes Ears, nose, mouth, throat, and face: ( - ) mucositis, ( - ) sore throat Respiratory: ( - ) cough, ( + ) dyspnea, ( - ) wheezes Cardiovascular: ( - ) palpitation, ( - ) chest discomfort, ( - ) lower extremity swelling Gastrointestinal:  (- ) nausea, ( - ) heartburn, ( - ) change in bowel habits Skin: ( - ) abnormal skin rashes Lymphatics: ( - ) new lymphadenopathy, ( - ) easy bruising Neurological: ( - ) numbness, ( - ) tingling, ( - ) new  weaknesses Behavioral/Psych: ( - ) mood change, ( - ) new changes  All other  systems were reviewed with the patient and are negative.  PHYSICAL EXAMINATION: ECOG PERFORMANCE STATUS: 2 - Symptomatic, <50% confined to bed  Vitals:   03/02/24 1332  BP: (!) 105/91  Pulse: 79  Resp: 18  Temp: (!) 97.3 F (36.3 C)  SpO2: 98%      Filed Weights   03/02/24 1332  Weight: 121 lb 4.8 oz (55 kg)       GENERAL: Chronically ill appearing middle-aged Caucasian male, alert, no distress and comfortable SKIN: Dry cracking rash of hands bilaterally.  No issues on the chest, face, or lower extremities.  Skin color, texture, turgor are normal, no rashes or significant lesions.  EYES: conjunctiva are pink and non-injected, sclera clear LUNGS: clear to auscultation and percussion with normal breathing effort HEART: regular rate & rhythm and no murmurs and no lower extremity edema  MSK: Bilateral edema of hands.  PSYCH: alert & oriented x 3, fluent speech NEURO: no focal motor/sensory deficits   LABORATORY DATA:  I have reviewed the data as listed    Latest Ref Rng & Units 03/02/2024   12:47 PM 01/30/2024    1:38 PM 01/10/2024    4:02 AM  CBC  WBC 4.0 - 10.5 K/uL 5.1  3.5  10.4   Hemoglobin 13.0 - 17.0 g/dL 13.2  44.0  10.2   Hematocrit 39.0 - 52.0 % 39.4  43.9  36.1   Platelets 150 - 400 K/uL 182  184  223        Latest Ref Rng & Units 03/02/2024   12:47 PM 01/30/2024    1:38 PM 01/10/2024    4:02 AM  CMP  Glucose 70 - 99 mg/dL 97  725  88   BUN 8 - 23 mg/dL 18  8  20    Creatinine 0.61 - 1.24 mg/dL 3.66  4.40  3.47   Sodium 135 - 145 mmol/L 140  138  132   Potassium 3.5 - 5.1 mmol/L 4.1  4.3  3.8   Chloride 98 - 111 mmol/L 106  104  99   CO2 22 - 32 mmol/L 26  28  23    Calcium 8.9 - 10.3 mg/dL 8.5  8.9  8.5   Total Protein 6.5 - 8.1 g/dL 6.1  6.1    Total Bilirubin 0.0 - 1.2 mg/dL 0.7  0.6    Alkaline Phos 38 - 126 U/L 81  62    AST 15 - 41 U/L 17  16    ALT 0 - 44 U/L 9  11       Lab Results  Component Value Date   MPROTEIN 0.2 (H) 03/02/2024   MPROTEIN 0.1 (H) 01/30/2024   MPROTEIN 0.1 (H) 12/09/2023   Lab Results  Component Value Date   KPAFRELGTCHN 25.8 (H) 01/30/2024   KPAFRELGTCHN 24.5 (H) 12/09/2023   KPAFRELGTCHN 26.1 (H) 11/11/2023   LAMBDASER 8.2 01/30/2024   LAMBDASER 8.1 12/09/2023   LAMBDASER 8.9 11/11/2023   KAPLAMBRATIO 3.15 (H) 01/30/2024   KAPLAMBRATIO 3.02 (H) 12/09/2023   KAPLAMBRATIO 2.93 (H) 11/11/2023    RADIOGRAPHIC STUDIES: No results found.   ASSESSMENT & PLAN JEDADIAH ABDALLAH 63 y.o. male with medical history significant for Waldenstrom's macroglobulinemia who presents for a follow up visit.   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  There is no clear evidence of hyperviscosity syndrome at this time.  At this time  we will plan to proceed with ibrutinib  and rituximab  therapy.  One could also consider Bendamustine and rituximab  therapy but given the patient's degree of anemia I would prefer pursuing ibrutinib  and rituximab  at this time.  Additionally ibrutinib /rituximab  are category 1 recommendations per the NCCN guidelines.  The treatment will consist of ibrutinib  420 mg p.o. daily with rituximab  on weeks 1 through 4 as well as weeks 27 through 30.  Previously we discussed the risks and benefits of therapy including (but not limited to) risk of bleeding, fatigue, atypical infections, and cardiac arrhythmias.  The patient voices understanding of this plan moving forward.   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 --Started second round of weekly rituximab  on 12/02/2021. Continued weekly x 4 weeks.   --last rituximab  dose held due to missed visits/illness.  Plan: --Labs today were reviewed without any intervention needed.  Labs show white cell count 5.1, hemoglobin 12.8 platelets 182. Pending SPEP and sFLC levels but prior levels appear stable. --Currently on ibrutinib  420 mg PO daily.  --RTC in 4 weeks with labs   # Lesion on Back Of Head- resolved  -- Patient had a ping-pong ball sized lesion on the back of his head.  It appeared to be draining and has some dried blood and crusting. -- Patient evaluated by Dr. Myrtie Atkinson in dermatology.  Lesion was biopsied and found not to be cancerous. -- Appears to be healing well and improving greatly.  Continue per Dr. Christiane Cowing recommendations.  # Rash-improved markedly -- Affecting hands bilaterally, upper extremities bilaterally, and back.  Not affecting face, chest, or lower extremities. -- Patient has dryness and cracking as well as erythema on the upper extremities. -- Etiology is unclear.  Patient is using hydrating creams but is not effective.  Made referral to dermatology for evaluation, he is established with Dr. Louana Roup.  #Pain Management --Pain was poorly controlled on hydrocodone  therapy previously.  --Patient was previously admitted for for opioid overdose with presumed fentanyl  --Currently pain regimen includes methadone  10 mg TID and oxycodone  10 mg PRN for breakthrough pain per palliative care.  #Normocytic Anemia  --Hgb 12.8, stable --previously there was a component of iron  deficiency anemia as well.  Currently on PO ferrous sulfate , recommend to continue  #Nausea and vomiting--stable -- Concern that the nausea and vomiting may not be related to the ibrutinib  therapy and potentially tied to poor gastric motility due to opioid prescription.  --Unable to tolerate olanzopine so discontinued and not taking  metoclopramide  10 mg every 8 hours  --continue zofran  PRN.   #Dyspnea on exertion --Advised to follow up with his cardiologist and to determine if he needs supplemental oxygen as he is 98% on room air. --He is scheduled for a follow up on 03/20/2024.   No orders of the defined types were placed in this encounter.  All questions were answered. The patient knows to call the clinic with any problems, questions or concerns.  I have spent a total of 30 minutes minutes of face-to-face and non-face-to-face time, preparing to see the patient,  performing a medically appropriate examination, counseling and educating the patient, ordering medications/tests, referring and communicating with other health care professionals, documenting clinical information in the electronic health record, and care coordination.   Wyline Hearing PA-C  Dept of Hematology and Oncology [facility] Phone: [phone]     03/06/2024 8:46 AM  Dimopoulos MA, Raechel Bulla, Regina Capes, Mahe B, Herbaux C, Tam C, Orsucci L, Palomba ML, Matous JV, [place] C, Kastritis E, Treon SP, Li J, Salman Z, Graef T, Buske C; iNNOVATE Study Group and the [employer]. Phase 3 Trial of Ibrutinib  plus Rituximab  in Waldenstrm's Macroglobulinemia. Ole Berkeley J Med. 2018 Jun 21;378(25):2399-2410.   --At 30 months, the progression-free survival rate was 82% with ibrutinib -rituximab  versus 28% with placebo-rituximab  (hazard ratio for progression or death, 0.20; P<0.001).

## 2024-03-07 ENCOUNTER — Other Ambulatory Visit: Payer: Self-pay

## 2024-03-07 ENCOUNTER — Other Ambulatory Visit: Payer: Self-pay | Admitting: Nurse Practitioner

## 2024-03-07 ENCOUNTER — Other Ambulatory Visit (HOSPITAL_COMMUNITY): Payer: Self-pay

## 2024-03-07 DIAGNOSIS — G893 Neoplasm related pain (acute) (chronic): Secondary | ICD-10-CM

## 2024-03-07 DIAGNOSIS — Z515 Encounter for palliative care: Secondary | ICD-10-CM

## 2024-03-07 DIAGNOSIS — C8800 Waldenstrom macroglobulinemia not having achieved remission: Secondary | ICD-10-CM

## 2024-03-07 MED ORDER — OXYCODONE HCL 10 MG PO TABS
10.0000 mg | ORAL_TABLET | ORAL | 0 refills | Status: DC | PRN
  Filled 2024-03-12: qty 46, 7d supply, fill #0
  Filled 2024-03-12: qty 44, 8d supply, fill #0
  Filled ????-??-??: fill #0

## 2024-03-09 ENCOUNTER — Other Ambulatory Visit (HOSPITAL_COMMUNITY): Payer: Self-pay

## 2024-03-12 ENCOUNTER — Other Ambulatory Visit: Payer: Self-pay

## 2024-03-12 ENCOUNTER — Other Ambulatory Visit (HOSPITAL_COMMUNITY): Payer: Self-pay

## 2024-03-12 ENCOUNTER — Other Ambulatory Visit (HOSPITAL_BASED_OUTPATIENT_CLINIC_OR_DEPARTMENT_OTHER): Payer: Self-pay

## 2024-03-14 ENCOUNTER — Other Ambulatory Visit: Payer: Self-pay

## 2024-03-14 ENCOUNTER — Other Ambulatory Visit: Payer: Self-pay | Admitting: Nurse Practitioner

## 2024-03-14 DIAGNOSIS — G893 Neoplasm related pain (acute) (chronic): Secondary | ICD-10-CM

## 2024-03-14 DIAGNOSIS — G4700 Insomnia, unspecified: Secondary | ICD-10-CM

## 2024-03-14 DIAGNOSIS — C8800 Waldenstrom macroglobulinemia not having achieved remission: Secondary | ICD-10-CM

## 2024-03-14 DIAGNOSIS — Z515 Encounter for palliative care: Secondary | ICD-10-CM

## 2024-03-15 ENCOUNTER — Other Ambulatory Visit (HOSPITAL_COMMUNITY): Payer: Self-pay

## 2024-03-15 MED ORDER — DIAZEPAM 5 MG PO TABS
5.0000 mg | ORAL_TABLET | Freq: Two times a day (BID) | ORAL | 0 refills | Status: DC | PRN
  Filled 2024-03-15: qty 60, 30d supply, fill #0

## 2024-03-16 ENCOUNTER — Other Ambulatory Visit: Payer: Self-pay

## 2024-03-19 ENCOUNTER — Telehealth (HOSPITAL_COMMUNITY): Payer: Self-pay

## 2024-03-19 NOTE — Telephone Encounter (Signed)
 Called to confirm/remind patient of their appointment [facility] on 03/20/24.   Appointment:   [] Confirmed  [x] Left mess   [] No answer/No voice mail  [] VM Full/unable to leave message  [] Phone not in service  And to bring in all medications and/or complete list.

## 2024-03-20 ENCOUNTER — Other Ambulatory Visit: Payer: Self-pay

## 2024-03-20 ENCOUNTER — Encounter (HOSPITAL_COMMUNITY): Payer: Medicare (Managed Care)

## 2024-03-20 ENCOUNTER — Inpatient Hospital Stay (HOSPITAL_COMMUNITY)
Admission: EM | Admit: 2024-03-20 | Discharge: 2024-04-10 | DRG: 291 | Disposition: E | Payer: Medicare (Managed Care) | Attending: Critical Care Medicine | Admitting: Critical Care Medicine

## 2024-03-20 DIAGNOSIS — J9621 Acute and chronic respiratory failure with hypoxia: Secondary | ICD-10-CM | POA: Diagnosis present

## 2024-03-20 DIAGNOSIS — R17 Unspecified jaundice: Secondary | ICD-10-CM | POA: Diagnosis not present

## 2024-03-20 DIAGNOSIS — N179 Acute kidney failure, unspecified: Secondary | ICD-10-CM | POA: Diagnosis present

## 2024-03-20 DIAGNOSIS — Z66 Do not resuscitate: Secondary | ICD-10-CM | POA: Diagnosis not present

## 2024-03-20 DIAGNOSIS — Z885 Allergy status to narcotic agent status: Secondary | ICD-10-CM

## 2024-03-20 DIAGNOSIS — D696 Thrombocytopenia, unspecified: Secondary | ICD-10-CM | POA: Diagnosis present

## 2024-03-20 DIAGNOSIS — J441 Chronic obstructive pulmonary disease with (acute) exacerbation: Secondary | ICD-10-CM | POA: Diagnosis present

## 2024-03-20 DIAGNOSIS — R000 Tachycardia, unspecified: Secondary | ICD-10-CM | POA: Diagnosis not present

## 2024-03-20 DIAGNOSIS — J449 Chronic obstructive pulmonary disease, unspecified: Secondary | ICD-10-CM | POA: Diagnosis present

## 2024-03-20 DIAGNOSIS — C8800 Waldenstrom macroglobulinemia not having achieved remission: Secondary | ICD-10-CM | POA: Diagnosis present

## 2024-03-20 DIAGNOSIS — I110 Hypertensive heart disease with heart failure: Principal | ICD-10-CM | POA: Diagnosis present

## 2024-03-20 DIAGNOSIS — R6521 Severe sepsis with septic shock: Secondary | ICD-10-CM | POA: Diagnosis not present

## 2024-03-20 DIAGNOSIS — J9691 Respiratory failure, unspecified with hypoxia: Secondary | ICD-10-CM | POA: Diagnosis not present

## 2024-03-20 DIAGNOSIS — D649 Anemia, unspecified: Secondary | ICD-10-CM | POA: Diagnosis present

## 2024-03-20 DIAGNOSIS — K7200 Acute and subacute hepatic failure without coma: Secondary | ICD-10-CM | POA: Diagnosis present

## 2024-03-20 DIAGNOSIS — I5023 Acute on chronic systolic (congestive) heart failure: Secondary | ICD-10-CM | POA: Diagnosis present

## 2024-03-20 DIAGNOSIS — J189 Pneumonia, unspecified organism: Secondary | ICD-10-CM | POA: Diagnosis present

## 2024-03-20 DIAGNOSIS — E43 Unspecified severe protein-calorie malnutrition: Secondary | ICD-10-CM | POA: Diagnosis present

## 2024-03-20 DIAGNOSIS — I428 Other cardiomyopathies: Secondary | ICD-10-CM | POA: Diagnosis present

## 2024-03-20 DIAGNOSIS — I462 Cardiac arrest due to underlying cardiac condition: Secondary | ICD-10-CM | POA: Diagnosis present

## 2024-03-20 DIAGNOSIS — J440 Chronic obstructive pulmonary disease with acute lower respiratory infection: Secondary | ICD-10-CM | POA: Diagnosis present

## 2024-03-20 DIAGNOSIS — J90 Pleural effusion, not elsewhere classified: Secondary | ICD-10-CM | POA: Diagnosis not present

## 2024-03-20 DIAGNOSIS — R578 Other shock: Secondary | ICD-10-CM | POA: Diagnosis present

## 2024-03-20 DIAGNOSIS — F32A Depression, unspecified: Secondary | ICD-10-CM | POA: Diagnosis present

## 2024-03-20 DIAGNOSIS — R0689 Other abnormalities of breathing: Secondary | ICD-10-CM | POA: Diagnosis not present

## 2024-03-20 DIAGNOSIS — Z79899 Other long term (current) drug therapy: Secondary | ICD-10-CM

## 2024-03-20 DIAGNOSIS — E875 Hyperkalemia: Secondary | ICD-10-CM | POA: Diagnosis present

## 2024-03-20 DIAGNOSIS — E872 Acidosis, unspecified: Secondary | ICD-10-CM | POA: Diagnosis present

## 2024-03-20 DIAGNOSIS — Z8249 Family history of ischemic heart disease and other diseases of the circulatory system: Secondary | ICD-10-CM

## 2024-03-20 DIAGNOSIS — R570 Cardiogenic shock: Secondary | ICD-10-CM | POA: Diagnosis present

## 2024-03-20 DIAGNOSIS — R0602 Shortness of breath: Secondary | ICD-10-CM | POA: Diagnosis not present

## 2024-03-20 DIAGNOSIS — R7989 Other specified abnormal findings of blood chemistry: Secondary | ICD-10-CM | POA: Diagnosis not present

## 2024-03-20 DIAGNOSIS — F419 Anxiety disorder, unspecified: Secondary | ICD-10-CM | POA: Diagnosis present

## 2024-03-20 DIAGNOSIS — Z515 Encounter for palliative care: Secondary | ICD-10-CM

## 2024-03-20 DIAGNOSIS — R579 Shock, unspecified: Secondary | ICD-10-CM | POA: Diagnosis not present

## 2024-03-20 DIAGNOSIS — R64 Cachexia: Secondary | ICD-10-CM | POA: Diagnosis present

## 2024-03-20 DIAGNOSIS — I509 Heart failure, unspecified: Secondary | ICD-10-CM | POA: Diagnosis not present

## 2024-03-20 DIAGNOSIS — G8929 Other chronic pain: Secondary | ICD-10-CM | POA: Diagnosis present

## 2024-03-20 DIAGNOSIS — R739 Hyperglycemia, unspecified: Secondary | ICD-10-CM | POA: Diagnosis present

## 2024-03-20 DIAGNOSIS — R54 Age-related physical debility: Secondary | ICD-10-CM | POA: Diagnosis present

## 2024-03-20 DIAGNOSIS — Z6820 Body mass index (BMI) 20.0-20.9, adult: Secondary | ICD-10-CM

## 2024-03-20 DIAGNOSIS — I469 Cardiac arrest, cause unspecified: Principal | ICD-10-CM

## 2024-03-20 DIAGNOSIS — J9622 Acute and chronic respiratory failure with hypercapnia: Secondary | ICD-10-CM | POA: Diagnosis present

## 2024-03-20 DIAGNOSIS — A419 Sepsis, unspecified organism: Secondary | ICD-10-CM | POA: Diagnosis not present

## 2024-03-20 DIAGNOSIS — I447 Left bundle-branch block, unspecified: Secondary | ICD-10-CM | POA: Diagnosis not present

## 2024-03-20 DIAGNOSIS — Z7982 Long term (current) use of aspirin: Secondary | ICD-10-CM

## 2024-03-21 ENCOUNTER — Inpatient Hospital Stay (HOSPITAL_COMMUNITY): Payer: Medicare (Managed Care)

## 2024-03-21 ENCOUNTER — Emergency Department (HOSPITAL_COMMUNITY): Payer: Medicare (Managed Care)

## 2024-03-21 ENCOUNTER — Encounter (HOSPITAL_COMMUNITY): Payer: Self-pay | Admitting: Emergency Medicine

## 2024-03-21 ENCOUNTER — Other Ambulatory Visit: Payer: Self-pay

## 2024-03-21 DIAGNOSIS — I428 Other cardiomyopathies: Secondary | ICD-10-CM | POA: Diagnosis present

## 2024-03-21 DIAGNOSIS — Z515 Encounter for palliative care: Secondary | ICD-10-CM | POA: Diagnosis not present

## 2024-03-21 DIAGNOSIS — J9691 Respiratory failure, unspecified with hypoxia: Secondary | ICD-10-CM

## 2024-03-21 DIAGNOSIS — E872 Acidosis, unspecified: Secondary | ICD-10-CM | POA: Diagnosis present

## 2024-03-21 DIAGNOSIS — R6521 Severe sepsis with septic shock: Secondary | ICD-10-CM

## 2024-03-21 DIAGNOSIS — D696 Thrombocytopenia, unspecified: Secondary | ICD-10-CM

## 2024-03-21 DIAGNOSIS — N179 Acute kidney failure, unspecified: Secondary | ICD-10-CM | POA: Diagnosis present

## 2024-03-21 DIAGNOSIS — R570 Cardiogenic shock: Secondary | ICD-10-CM

## 2024-03-21 DIAGNOSIS — J9621 Acute and chronic respiratory failure with hypoxia: Secondary | ICD-10-CM | POA: Diagnosis present

## 2024-03-21 DIAGNOSIS — I462 Cardiac arrest due to underlying cardiac condition: Secondary | ICD-10-CM | POA: Diagnosis present

## 2024-03-21 DIAGNOSIS — J441 Chronic obstructive pulmonary disease with (acute) exacerbation: Secondary | ICD-10-CM | POA: Diagnosis present

## 2024-03-21 DIAGNOSIS — R64 Cachexia: Secondary | ICD-10-CM | POA: Diagnosis present

## 2024-03-21 DIAGNOSIS — I509 Heart failure, unspecified: Secondary | ICD-10-CM | POA: Diagnosis not present

## 2024-03-21 DIAGNOSIS — K7200 Acute and subacute hepatic failure without coma: Secondary | ICD-10-CM | POA: Diagnosis present

## 2024-03-21 DIAGNOSIS — J449 Chronic obstructive pulmonary disease, unspecified: Secondary | ICD-10-CM | POA: Diagnosis present

## 2024-03-21 DIAGNOSIS — J189 Pneumonia, unspecified organism: Secondary | ICD-10-CM

## 2024-03-21 DIAGNOSIS — I469 Cardiac arrest, cause unspecified: Secondary | ICD-10-CM | POA: Diagnosis not present

## 2024-03-21 DIAGNOSIS — E43 Unspecified severe protein-calorie malnutrition: Secondary | ICD-10-CM | POA: Diagnosis present

## 2024-03-21 DIAGNOSIS — J9622 Acute and chronic respiratory failure with hypercapnia: Secondary | ICD-10-CM | POA: Diagnosis present

## 2024-03-21 DIAGNOSIS — Z66 Do not resuscitate: Secondary | ICD-10-CM | POA: Diagnosis not present

## 2024-03-21 DIAGNOSIS — A419 Sepsis, unspecified organism: Secondary | ICD-10-CM

## 2024-03-21 DIAGNOSIS — R578 Other shock: Secondary | ICD-10-CM | POA: Diagnosis present

## 2024-03-21 DIAGNOSIS — J440 Chronic obstructive pulmonary disease with acute lower respiratory infection: Secondary | ICD-10-CM | POA: Diagnosis present

## 2024-03-21 DIAGNOSIS — I110 Hypertensive heart disease with heart failure: Secondary | ICD-10-CM | POA: Diagnosis present

## 2024-03-21 DIAGNOSIS — J90 Pleural effusion, not elsewhere classified: Secondary | ICD-10-CM

## 2024-03-21 DIAGNOSIS — D649 Anemia, unspecified: Secondary | ICD-10-CM | POA: Diagnosis present

## 2024-03-21 DIAGNOSIS — C8800 Waldenstrom macroglobulinemia not having achieved remission: Secondary | ICD-10-CM | POA: Diagnosis present

## 2024-03-21 DIAGNOSIS — R7989 Other specified abnormal findings of blood chemistry: Secondary | ICD-10-CM

## 2024-03-21 DIAGNOSIS — I5023 Acute on chronic systolic (congestive) heart failure: Secondary | ICD-10-CM | POA: Diagnosis present

## 2024-03-21 DIAGNOSIS — F32A Depression, unspecified: Secondary | ICD-10-CM | POA: Diagnosis present

## 2024-03-21 LAB — COMPREHENSIVE METABOLIC PANEL WITH GFR
ALT: 61 U/L — ABNORMAL HIGH (ref 0–44)
ALT: 236 U/L — ABNORMAL HIGH (ref 0–44)
AST: 97 U/L — ABNORMAL HIGH (ref 15–41)
AST: 433 U/L — ABNORMAL HIGH (ref 15–41)
Albumin: 2.8 g/dL — ABNORMAL LOW (ref 3.5–5.0)
Albumin: 2.1 g/dL — ABNORMAL LOW (ref 3.5–5.0)
Alkaline Phosphatase: 95 U/L (ref 38–126)
Alkaline Phosphatase: 110 U/L (ref 38–126)
Anion gap: 15 (ref 5–15)
Anion gap: 16 — ABNORMAL HIGH (ref 5–15)
BUN: 22 mg/dL (ref 8–23)
BUN: 23 mg/dL (ref 8–23)
CO2: 16 mmol/L — ABNORMAL LOW (ref 22–32)
CO2: 14 mmol/L — ABNORMAL LOW (ref 22–32)
Calcium: 8 mg/dL — ABNORMAL LOW (ref 8.9–10.3)
Calcium: 8.4 mg/dL — ABNORMAL LOW (ref 8.9–10.3)
Chloride: 105 mmol/L (ref 98–111)
Chloride: 105 mmol/L (ref 98–111)
Creatinine, Ser: 1.65 mg/dL — ABNORMAL HIGH (ref 0.61–1.24)
Creatinine, Ser: 1.67 mg/dL — ABNORMAL HIGH (ref 0.61–1.24)
GFR, Estimated: 46 mL/min — ABNORMAL LOW (ref >60)
GFR, Estimated: 46 mL/min — ABNORMAL LOW (ref >60)
Glucose, Bld: 134 mg/dL — ABNORMAL HIGH (ref 70–99)
Glucose, Bld: 235 mg/dL — ABNORMAL HIGH (ref 70–99)
Potassium: 4.9 mmol/L (ref 3.5–5.1)
Potassium: 5.7 mmol/L — ABNORMAL HIGH (ref 3.5–5.1)
Sodium: 136 mmol/L (ref 135–145)
Sodium: 135 mmol/L (ref 135–145)
Total Bilirubin: 1.9 mg/dL — ABNORMAL HIGH (ref 0.0–1.2)
Total Bilirubin: 1.9 mg/dL — ABNORMAL HIGH (ref 0.0–1.2)
Total Protein: 5.2 g/dL — ABNORMAL LOW (ref 6.5–8.1)
Total Protein: 4.2 g/dL — ABNORMAL LOW (ref 6.5–8.1)

## 2024-03-21 LAB — CBG MONITORING, ED
Glucose-Capillary: 113 mg/dL — ABNORMAL HIGH (ref 70–99)
Glucose-Capillary: 116 mg/dL — ABNORMAL HIGH (ref 70–99)
Glucose-Capillary: 27 mg/dL — CL (ref 70–99)
Glucose-Capillary: 33 mg/dL — CL (ref 70–99)
Glucose-Capillary: 84 mg/dL (ref 70–99)

## 2024-03-21 LAB — URINALYSIS, W/ REFLEX TO CULTURE (INFECTION SUSPECTED)
Bilirubin Urine: NEGATIVE
Glucose, UA: 50 mg/dL — AB
Ketones, ur: NEGATIVE mg/dL
Leukocytes,Ua: NEGATIVE
Nitrite: NEGATIVE
Protein, ur: 100 mg/dL — AB
Specific Gravity, Urine: 1.013 (ref 1.005–1.030)
pH: 5 (ref 5.0–8.0)

## 2024-03-21 LAB — CBC
HCT: 37.5 % — ABNORMAL LOW (ref 39.0–52.0)
Hemoglobin: 11.1 g/dL — ABNORMAL LOW (ref 13.0–17.0)
MCH: 27 pg (ref 26.0–34.0)
MCHC: 29.6 g/dL — ABNORMAL LOW (ref 30.0–36.0)
MCV: 91.2 fL (ref 80.0–100.0)
Platelets: 106 10*3/uL — ABNORMAL LOW (ref 150–400)
RBC: 4.11 MIL/uL — ABNORMAL LOW (ref 4.22–5.81)
RDW: 19.1 % — ABNORMAL HIGH (ref 11.5–15.5)
WBC: 7.7 10*3/uL (ref 4.0–10.5)
nRBC: 0.4 % — ABNORMAL HIGH (ref 0.0–0.2)

## 2024-03-21 LAB — POCT I-STAT 7, (LYTES, BLD GAS, ICA,H+H)
Acid-base deficit: 3 mmol/L — ABNORMAL HIGH (ref 0.0–2.0)
Acid-base deficit: 19 mmol/L — ABNORMAL HIGH (ref 0.0–2.0)
Acid-base deficit: 4 mmol/L — ABNORMAL HIGH (ref 0.0–2.0)
Bicarbonate: 26.9 mmol/L (ref 20.0–28.0)
Bicarbonate: 13.1 mmol/L — ABNORMAL LOW (ref 20.0–28.0)
Bicarbonate: 23.8 mmol/L (ref 20.0–28.0)
Calcium, Ion: 1 mmol/L — ABNORMAL LOW (ref 1.15–1.40)
Calcium, Ion: 1.01 mmol/L — ABNORMAL LOW (ref 1.15–1.40)
Calcium, Ion: 0.99 mmol/L — ABNORMAL LOW (ref 1.15–1.40)
HCT: 30 % — ABNORMAL LOW (ref 39.0–52.0)
HCT: 36 % — ABNORMAL LOW (ref 39.0–52.0)
HCT: 29 % — ABNORMAL LOW (ref 39.0–52.0)
Hemoglobin: 10.2 g/dL — ABNORMAL LOW (ref 13.0–17.0)
Hemoglobin: 12.2 g/dL — ABNORMAL LOW (ref 13.0–17.0)
Hemoglobin: 9.9 g/dL — ABNORMAL LOW (ref 13.0–17.0)
O2 Saturation: 82 %
O2 Saturation: 81 %
O2 Saturation: 80 %
Patient temperature: 97.5
Patient temperature: 35
Patient temperature: 35
Potassium: 5.6 mmol/L — ABNORMAL HIGH (ref 3.5–5.1)
Potassium: 5.3 mmol/L — ABNORMAL HIGH (ref 3.5–5.1)
Potassium: 3.9 mmol/L (ref 3.5–5.1)
Sodium: 146 mmol/L — ABNORMAL HIGH (ref 135–145)
Sodium: 138 mmol/L (ref 135–145)
Sodium: 145 mmol/L (ref 135–145)
TCO2: 29 mmol/L (ref 22–32)
TCO2: 15 mmol/L — ABNORMAL LOW (ref 22–32)
TCO2: 26 mmol/L (ref 22–32)
pCO2 arterial: 73.5 mmHg (ref 32–48)
pCO2 arterial: 52.3 mmHg — ABNORMAL HIGH (ref 32–48)
pCO2 arterial: 52.3 mmHg — ABNORMAL HIGH (ref 32–48)
pH, Arterial: 7.168 — CL (ref 7.35–7.45)
pH, Arterial: 6.994 — CL (ref 7.35–7.45)
pH, Arterial: 7.255 — ABNORMAL LOW (ref 7.35–7.45)
pO2, Arterial: 58 mmHg — ABNORMAL LOW (ref 83–108)
pO2, Arterial: 62 mmHg — ABNORMAL LOW (ref 83–108)
pO2, Arterial: 46 mmHg — ABNORMAL LOW (ref 83–108)

## 2024-03-21 LAB — PROTIME-INR
INR: 1.3 — ABNORMAL HIGH (ref 0.8–1.2)
INR: 2.4 — ABNORMAL HIGH (ref 0.8–1.2)
Prothrombin Time: 16.8 s — ABNORMAL HIGH (ref 11.4–15.2)
Prothrombin Time: 26.4 s — ABNORMAL HIGH (ref 11.4–15.2)

## 2024-03-21 LAB — I-STAT VENOUS BLOOD GAS, ED
Acid-base deficit: 13 mmol/L — ABNORMAL HIGH (ref 0.0–2.0)
Bicarbonate: 15.2 mmol/L — ABNORMAL LOW (ref 20.0–28.0)
Calcium, Ion: 1.05 mmol/L — ABNORMAL LOW (ref 1.15–1.40)
HCT: 39 % (ref 39.0–52.0)
Hemoglobin: 13.3 g/dL (ref 13.0–17.0)
O2 Saturation: 71 %
Potassium: 4.9 mmol/L (ref 3.5–5.1)
Sodium: 137 mmol/L (ref 135–145)
TCO2: 16 mmol/L — ABNORMAL LOW (ref 22–32)
pCO2, Ven: 42.2 mmHg — ABNORMAL LOW (ref 44–60)
pH, Ven: 7.163 — CL (ref 7.25–7.43)
pO2, Ven: 47 mmHg — ABNORMAL HIGH (ref 32–45)

## 2024-03-21 LAB — CBC WITH DIFFERENTIAL/PLATELET
Abs Immature Granulocytes: 0.11 10*3/uL — ABNORMAL HIGH (ref 0.00–0.07)
Basophils Absolute: 0.1 10*3/uL (ref 0.0–0.1)
Basophils Relative: 1 %
Eosinophils Absolute: 0 10*3/uL (ref 0.0–0.5)
Eosinophils Relative: 0 %
HCT: 39.3 % (ref 39.0–52.0)
Hemoglobin: 12.2 g/dL — ABNORMAL LOW (ref 13.0–17.0)
Immature Granulocytes: 1 %
Lymphocytes Relative: 16 %
Lymphs Abs: 1.2 10*3/uL (ref 0.7–4.0)
MCH: 27.5 pg (ref 26.0–34.0)
MCHC: 31 g/dL (ref 30.0–36.0)
MCV: 88.5 fL (ref 80.0–100.0)
Monocytes Absolute: 0.7 10*3/uL (ref 0.1–1.0)
Monocytes Relative: 9 %
Neutro Abs: 5.8 10*3/uL (ref 1.7–7.7)
Neutrophils Relative %: 73 %
Platelets: 138 10*3/uL — ABNORMAL LOW (ref 150–400)
RBC: 4.44 MIL/uL (ref 4.22–5.81)
RDW: 19.1 % — ABNORMAL HIGH (ref 11.5–15.5)
WBC: 7.9 10*3/uL (ref 4.0–10.5)
nRBC: 0 % (ref 0.0–0.2)

## 2024-03-21 LAB — I-STAT CHEM 8, ED
BUN: 23 mg/dL (ref 8–23)
Calcium, Ion: 1.06 mmol/L — ABNORMAL LOW (ref 1.15–1.40)
Chloride: 107 mmol/L (ref 98–111)
Creatinine, Ser: 1.5 mg/dL — ABNORMAL HIGH (ref 0.61–1.24)
Glucose, Bld: 90 mg/dL (ref 70–99)
HCT: 40 % (ref 39.0–52.0)
Hemoglobin: 13.6 g/dL (ref 13.0–17.0)
Potassium: 4.9 mmol/L (ref 3.5–5.1)
Sodium: 137 mmol/L (ref 135–145)
TCO2: 16 mmol/L — ABNORMAL LOW (ref 22–32)

## 2024-03-21 LAB — I-STAT CG4 LACTIC ACID, ED: Lactic Acid, Venous: 6.1 mmol/L (ref 0.5–1.9)

## 2024-03-21 LAB — TROPONIN I (HIGH SENSITIVITY)
Troponin I (High Sensitivity): 119 ng/L (ref <18)
Troponin I (High Sensitivity): 521 ng/L (ref <18)

## 2024-03-21 LAB — MAGNESIUM: Magnesium: 2.3 mg/dL (ref 1.7–2.4)

## 2024-03-21 LAB — MRSA NEXT GEN BY PCR, NASAL: MRSA by PCR Next Gen: DETECTED — AB

## 2024-03-21 LAB — I-STAT ARTERIAL BLOOD GAS, ED
Acid-base deficit: 19 mmol/L — ABNORMAL HIGH (ref 0.0–2.0)
Bicarbonate: 12.6 mmol/L — ABNORMAL LOW (ref 20.0–28.0)
Calcium, Ion: 1.21 mmol/L (ref 1.15–1.40)
HCT: 32 % — ABNORMAL LOW (ref 39.0–52.0)
Hemoglobin: 10.9 g/dL — ABNORMAL LOW (ref 13.0–17.0)
O2 Saturation: 93 %
Patient temperature: 97
Potassium: 5.8 mmol/L — ABNORMAL HIGH (ref 3.5–5.1)
Sodium: 134 mmol/L — ABNORMAL LOW (ref 135–145)
TCO2: 14 mmol/L — ABNORMAL LOW (ref 22–32)
pCO2 arterial: 51.6 mmHg — ABNORMAL HIGH (ref 32–48)
pH, Arterial: 6.99 — CL (ref 7.35–7.45)
pO2, Arterial: 98 mmHg (ref 83–108)

## 2024-03-21 LAB — LIPASE, BLOOD: Lipase: 20 U/L (ref 11–51)

## 2024-03-21 LAB — GLUCOSE, CAPILLARY
Glucose-Capillary: 119 mg/dL — ABNORMAL HIGH (ref 70–99)
Glucose-Capillary: 102 mg/dL — ABNORMAL HIGH (ref 70–99)

## 2024-03-21 LAB — PHOSPHORUS: Phosphorus: 9.4 mg/dL — ABNORMAL HIGH (ref 2.5–4.6)

## 2024-03-21 LAB — BRAIN NATRIURETIC PEPTIDE: B Natriuretic Peptide: 2024.6 pg/mL — ABNORMAL HIGH (ref 0.0–100.0)

## 2024-03-21 LAB — AMMONIA: Ammonia: 17 umol/L (ref 9–35)

## 2024-03-21 LAB — LACTIC ACID, PLASMA: Lactic Acid, Venous: >9 mmol/L (ref 0.5–1.9)

## 2024-03-21 MED ORDER — SODIUM CHLORIDE 0.9 % IV SOLN
500.0000 mg | Freq: Once | INTRAVENOUS | Status: AC
  Administered 2024-03-21: 500 mg via INTRAVENOUS
  Filled 2024-03-21: qty 5

## 2024-03-21 MED ORDER — IPRATROPIUM-ALBUTEROL 0.5-2.5 (3) MG/3ML IN SOLN
3.0000 mL | Freq: Four times a day (QID) | RESPIRATORY_TRACT | Status: DC
  Administered 2024-03-21: 3 mL via RESPIRATORY_TRACT
  Filled 2024-03-21 (×2): qty 3

## 2024-03-21 MED ORDER — GLYCOPYRROLATE 1 MG PO TABS: 1.0000 mg | ORAL_TABLET | ORAL | Status: DC | PRN

## 2024-03-21 MED ORDER — VANCOMYCIN HCL 1250 MG/250ML IV SOLN
1250.0000 mg | Freq: Once | INTRAVENOUS | Status: AC
  Administered 2024-03-21: 1250 mg via INTRAVENOUS
  Filled 2024-03-21: qty 250

## 2024-03-21 MED ORDER — VANCOMYCIN HCL IN DEXTROSE 1-5 GM/200ML-% IV SOLN: 1000.0000 mg | Freq: Once | INTRAVENOUS | Status: DC

## 2024-03-21 MED ORDER — SODIUM CHLORIDE 0.9 % IV SOLN
2.0000 g | Freq: Two times a day (BID) | INTRAVENOUS | Status: DC
  Administered 2024-03-21: 2 g via INTRAVENOUS
  Filled 2024-03-21: qty 12.5

## 2024-03-21 MED ORDER — FENTANYL 2500MCG IN NS 250ML (10MCG/ML) PREMIX INFUSION
0.0000 ug/h | INTRAVENOUS | Status: DC
  Administered 2024-03-21: 50 ug/h via INTRAVENOUS
  Filled 2024-03-21: qty 250

## 2024-03-21 MED ORDER — CALCIUM GLUCONATE-NACL 2-0.675 GM/100ML-% IV SOLN
2.0000 g | Freq: Once | INTRAVENOUS | Status: AC
  Administered 2024-03-21: 2000 mg via INTRAVENOUS
  Filled 2024-03-21: qty 100

## 2024-03-21 MED ORDER — CALCIUM GLUCONATE-NACL 1-0.675 GM/50ML-% IV SOLN: 1.0000 g | Freq: Once | INTRAVENOUS | Status: DC

## 2024-03-21 MED ORDER — SODIUM CHLORIDE 0.9 % IV BOLUS
1000.0000 mL | Freq: Once | INTRAVENOUS | Status: AC
  Administered 2024-03-21: 1000 mL via INTRAVENOUS

## 2024-03-21 MED ORDER — SODIUM CHLORIDE 0.9 % IV SOLN: 1.0000 g | INTRAVENOUS | Status: DC

## 2024-03-21 MED ORDER — SODIUM BICARBONATE 8.4 % IV SOLN: 100.0000 meq | Freq: Once | INTRAVENOUS | Status: AC

## 2024-03-21 MED ORDER — SODIUM BICARBONATE 8.4 % IV SOLN
INTRAVENOUS | Status: AC | PRN
  Administered 2024-03-21 (×2): 50 meq via INTRAVENOUS

## 2024-03-21 MED ORDER — SODIUM BICARBONATE 8.4 % IV SOLN
100.0000 meq | Freq: Once | INTRAVENOUS | Status: AC
  Administered 2024-03-21: 100 meq via INTRAVENOUS
  Filled 2024-03-21: qty 100

## 2024-03-21 MED ORDER — POLYETHYLENE GLYCOL 3350 17 G PO PACK: 17.0000 g | PACK | Freq: Every day | ORAL | Status: DC | PRN

## 2024-03-21 MED ORDER — INSULIN ASPART 100 UNIT/ML IJ SOLN: 0.0000 [IU] | INTRAMUSCULAR | Status: DC

## 2024-03-21 MED ORDER — DEXTROSE 50 % IV SOLN
1.0000 | Freq: Once | INTRAVENOUS | Status: AC
  Administered 2024-03-21: 50 mL via INTRAVENOUS

## 2024-03-21 MED ORDER — DOCUSATE SODIUM 100 MG PO CAPS: 100.0000 mg | ORAL_CAPSULE | Freq: Two times a day (BID) | ORAL | Status: DC | PRN

## 2024-03-21 MED ORDER — SODIUM BICARBONATE 8.4 % IV SOLN
Freq: Once | INTRAVENOUS | Status: DC
  Filled 2024-03-21: qty 1000

## 2024-03-21 MED ORDER — GLYCOPYRROLATE 0.2 MG/ML IJ SOLN: 0.2000 mg | INTRAMUSCULAR | Status: DC | PRN

## 2024-03-21 MED ORDER — SODIUM ZIRCONIUM CYCLOSILICATE 5 G PO PACK
5.0000 g | PACK | Freq: Once | ORAL | Status: DC
  Filled 2024-03-21: qty 1

## 2024-03-21 MED ORDER — SODIUM CHLORIDE 0.9 % IV SOLN
1.0000 g | Freq: Once | INTRAVENOUS | Status: AC
  Administered 2024-03-21: 1 g via INTRAVENOUS
  Filled 2024-03-21: qty 10

## 2024-03-21 MED ORDER — SODIUM BICARBONATE 8.4 % IV SOLN
INTRAVENOUS | Status: AC
  Administered 2024-03-21: 100 meq via INTRAVENOUS
  Filled 2024-03-21: qty 100

## 2024-03-21 MED ORDER — HEPARIN SODIUM (PORCINE) 5000 UNIT/ML IJ SOLN: 5000.0000 [IU] | Freq: Three times a day (TID) | INTRAMUSCULAR | Status: DC

## 2024-03-21 MED ORDER — DEXTROSE 50 % IV SOLN
INTRAVENOUS | Status: AC | PRN
  Administered 2024-03-21: 1 via INTRAVENOUS

## 2024-03-21 MED ORDER — SODIUM CHLORIDE 0.9 % IV SOLN: INTRAVENOUS | Status: DC | PRN

## 2024-03-21 MED ORDER — NOREPINEPHRINE 4 MG/250ML-% IV SOLN
INTRAVENOUS | Status: AC
  Filled 2024-03-21: qty 250

## 2024-03-21 MED ORDER — SODIUM CHLORIDE 0.9 % IV BOLUS
500.0000 mL | Freq: Once | INTRAVENOUS | Status: AC
  Administered 2024-03-21: 500 mL via INTRAVENOUS

## 2024-03-21 MED ORDER — NOREPINEPHRINE 16 MG/250ML-% IV SOLN
0.0000 ug/min | INTRAVENOUS | Status: DC
  Administered 2024-03-21: 50 ug/min via INTRAVENOUS
  Administered 2024-03-21: 40 ug/min via INTRAVENOUS
  Filled 2024-03-21 (×2): qty 250

## 2024-03-21 MED ORDER — CALCIUM CHLORIDE 10 % IV SOLN
INTRAVENOUS | Status: AC | PRN
  Administered 2024-03-21: 1 g via INTRAVENOUS

## 2024-03-21 MED ORDER — EPINEPHRINE 1 MG/10ML IJ SOSY
PREFILLED_SYRINGE | INTRAMUSCULAR | Status: AC | PRN
  Administered 2024-03-21: 1 mg via INTRAVENOUS

## 2024-03-21 MED ORDER — ETOMIDATE 2 MG/ML IV SOLN
INTRAVENOUS | Status: AC | PRN
  Administered 2024-03-21: 10 mg via INTRAVENOUS

## 2024-03-21 MED ORDER — EPINEPHRINE HCL 5 MG/250ML IV SOLN IN NS
0.5000 ug/min | INTRAVENOUS | Status: DC
  Administered 2024-03-21: 40 ug/min via INTRAVENOUS
  Filled 2024-03-21 (×2): qty 250

## 2024-03-21 MED ORDER — DOBUTAMINE-DEXTROSE 4-5 MG/ML-% IV SOLN
5.0000 ug/kg/min | INTRAVENOUS | Status: DC
  Administered 2024-03-21: 5 ug/kg/min via INTRAVENOUS
  Filled 2024-03-21: qty 250

## 2024-03-21 MED ORDER — FENTANYL CITRATE PF 50 MCG/ML IJ SOSY
200.0000 ug | PREFILLED_SYRINGE | Freq: Once | INTRAMUSCULAR | Status: AC
  Administered 2024-03-21: 200 ug via INTRAVENOUS

## 2024-03-21 MED ORDER — PANTOPRAZOLE SODIUM 40 MG IV SOLR: 40.0000 mg | Freq: Every day | INTRAVENOUS | Status: DC

## 2024-03-21 MED ORDER — GLYCOPYRROLATE 0.2 MG/ML IJ SOLN
0.2000 mg | INTRAMUSCULAR | Status: DC | PRN
  Administered 2024-03-21: 0.2 mg via INTRAVENOUS
  Filled 2024-03-21: qty 1

## 2024-03-21 MED ORDER — METHYLPREDNISOLONE SODIUM SUCC 125 MG IJ SOLR
125.0000 mg | Freq: Two times a day (BID) | INTRAMUSCULAR | Status: DC
  Administered 2024-03-21: 125 mg via INTRAVENOUS
  Filled 2024-03-21: qty 2

## 2024-03-21 MED ORDER — NOREPINEPHRINE 4 MG/250ML-% IV SOLN
0.0000 ug/min | INTRAVENOUS | Status: DC
  Administered 2024-03-21 (×2): 40 ug/min via INTRAVENOUS
  Administered 2024-03-21: 5 ug/min via INTRAVENOUS
  Filled 2024-03-21 (×2): qty 250

## 2024-03-21 MED ORDER — SUCCINYLCHOLINE CHLORIDE 20 MG/ML IJ SOLN
INTRAMUSCULAR | Status: AC | PRN
  Administered 2024-03-21: 100 mg via INTRAVENOUS

## 2024-03-21 MED ORDER — DOCUSATE SODIUM 50 MG/5ML PO LIQD: 100.0000 mg | Freq: Two times a day (BID) | ORAL | Status: DC | PRN

## 2024-03-21 MED ORDER — POLYVINYL ALCOHOL 1.4 % OP SOLN: 1.0000 [drp] | Freq: Four times a day (QID) | OPHTHALMIC | Status: DC | PRN

## 2024-03-21 MED ORDER — SODIUM CHLORIDE 0.9 % IV SOLN: 500.0000 mg | INTRAVENOUS | Status: DC

## 2024-03-21 MED ORDER — EPINEPHRINE 1 MG/10ML IJ SOSY
PREFILLED_SYRINGE | INTRAMUSCULAR | Status: AC | PRN
  Administered 2024-03-21 (×3): 1 mg via INTRAVENOUS

## 2024-03-21 MED ORDER — SODIUM BICARBONATE 8.4 % IV SOLN
100.0000 meq | Freq: Once | INTRAVENOUS | Status: AC
  Administered 2024-03-21: 100 meq via INTRAVENOUS

## 2024-03-21 MED ORDER — EPINEPHRINE HCL 5 MG/250ML IV SOLN IN NS
INTRAVENOUS | Status: AC
  Administered 2024-03-21: 10 ug/min via INTRAVENOUS
  Filled 2024-03-21: qty 250

## 2024-03-21 MED ORDER — SODIUM CHLORIDE 0.9 % IV SOLN
0.5000 ug/min | INTRAVENOUS | Status: DC
  Administered 2024-03-21: 50 ug/min via INTRAVENOUS
  Administered 2024-03-21: 40 ug/min via INTRAVENOUS
  Filled 2024-03-21 (×3): qty 10

## 2024-03-21 MED ORDER — SODIUM BICARBONATE 8.4 % IV SOLN
INTRAVENOUS | Status: DC
  Filled 2024-03-21 (×2): qty 1000
  Filled 2024-03-21: qty 150

## 2024-03-21 MED ORDER — IOHEXOL 350 MG/ML SOLN
75.0000 mL | Freq: Once | INTRAVENOUS | Status: AC | PRN
  Administered 2024-03-21: 75 mL via INTRAVENOUS

## 2024-03-21 MED ORDER — FENTANYL BOLUS VIA INFUSION
25.0000 ug | INTRAVENOUS | Status: DC | PRN
  Administered 2024-03-21: 50 ug via INTRAVENOUS
  Administered 2024-03-21: 25 ug via INTRAVENOUS

## 2024-03-22 LAB — CALCIUM, IONIZED: Calcium, Ionized, Serum: 3.8 mg/dL — ABNORMAL LOW (ref 4.5–5.6)

## 2024-03-26 ENCOUNTER — Other Ambulatory Visit: Payer: Self-pay

## 2024-03-26 ENCOUNTER — Other Ambulatory Visit: Payer: Self-pay | Admitting: Pharmacy Technician

## 2024-03-26 LAB — CULTURE, BLOOD (ROUTINE X 2)
Culture: NO GROWTH
Culture: NO GROWTH

## 2024-03-26 NOTE — Progress Notes (Signed)
 Disenrolled patient deceased

## 2024-03-29 ENCOUNTER — Other Ambulatory Visit (HOSPITAL_COMMUNITY): Payer: Self-pay

## 2024-03-29 ENCOUNTER — Inpatient Hospital Stay: Payer: Medicare (Managed Care) | Admitting: Hematology and Oncology

## 2024-03-29 ENCOUNTER — Inpatient Hospital Stay: Payer: Medicare (Managed Care)

## 2024-04-10 NOTE — Progress Notes (Signed)
 eLink Physician-Brief Progress Note Patient Name: Connor Solomon DOB: [birth year] MRN: [id number]   Date of Service  04/03/2024  HPI/Events of Note  Patient brought in to the ED with shortness of breath and hypotension, went into PEA cardiac arrest in the ED and required 10 minutes of resuscitation, intubated in the ED and now on the ventilator. Work up in progress.  eICU Interventions  New Patient Evaluation.        Adonai Selsor U Tamea Bai 03/26/2024, 4:48 AM

## 2024-04-10 NOTE — Death Summary Note (Signed)
 DEATH SUMMARY   Patient Details  Name: Connor Solomon MRN: [id number] DOB: [birth year]  Admission/Discharge Information   Admit Date:  04/03/2024  Date of Death: Date of Death: 04-Apr-2024  Time of Death: Time of Death: 1425-04-08  Length of Stay: 0  Referring Physician: Aurora Lees, DO   Reason(s) for Hospitalization  Acute on chronic HFrEF  Diagnoses  Preliminary cause of death:  Secondary Diagnoses (including complications and co-morbidities):  Principal Problem:   Cardiogenic shock (HCC) Acute on chronic HFrEF Acute on chronic respiratory failure with hypoxia and hypercapnia COPD with acute exacerbation Acute pulmonary edema PEA cardiac arrest Lactic acidosis AKI Hyperglycemia Hyperphosphatemia Hyperbilirubinemia Shock liver Hyperkalemia Anemia Thrombocytopenia End of life care Severe protein energy malnutrition History of Waldenstrom Macroglobulinemia No suspicion for sepsis  Brief Hospital Course (including significant findings, care, treatment, and services provided and events leading to death)  Connor Solomon is a 63 y.o. year old male with HTN, anxiety/depression, chronic pain, COPD (HOT 3 L [place]), Waldenstrom macroglobulinemia, and CHF (March 2025, Echo: EF <20%, G3DD), who presented to ED complaining of shortness of breath since am yesterday.  History was provided by EMS and ED staff.  EMS was called out to home for SOB. On their arrival he was found sitting on the commode, tripoding, hypotensive(SBP in the 70s with poor perfusion). He was placed on NRM due to poor waveform on SpO2 but initial sats were around 99%. He was treated with 500 cc of normal saline prior to ED arrival and another 1 L NS 0.9% in ED due to hypotension. He was in the hospital all day visiting his wife and left at 5.  No associated chest pain.  He did have upper abdominal pain that he thought it was hunger pain that was overall improving.  No fever.  He had intermittent lower extremity edema and this  was worsening compared to baseline. Coded in ED for PEA for 10 min before ROSC. Intubated ETT 7.5, 22 cc @ teeth. Started on Vanco, Zithromax , Rocephin , Levophed and Epi, and right radial a-line was inserted by RT. He was hypoglycemic and given D50. His SpO2 was 95% on 3 L [place] before coded.  Despite escalating pressors, bicarb infusion, aggressive ventilatory support, he failed to respond to therapy. Family came to bedside and elected to comfort focused care. He passed away with family at bedside.   Pertinent Labs and Studies  Significant Diagnostic Studies DG Abd 1 View Result Date: April 04, 2024 CLINICAL DATA:  [id number] Encounter for orogastric (OG) tube placement [id number] EXAM: ABDOMEN - 1 VIEW COMPARISON:  04/04/24 2:41 a.m. and 12:59 a.m. FINDINGS: Nonobstructive bowel gas pattern. Esophagogastric tube terminates in the region the distal esophagus. Partially visualized endotracheal tube in the mid trachea. Overlying defibrillator pads and cardiac monitoring device noted. No pneumoperitoneum. No organomegaly or radiopaque calculi. Unchanged patchy airspace opacities in the mid lungs with diffuse interstitial opacities. IMPRESSION: Esophagogastric tube terminates in the region of the distal esophagus. Continued advancement recommended for optimal positioning in the stomach. Electronically Signed   By: Rance Burrows M.D.   On: 04-04-24 11:05   DG Chest Portable 1 View Result Date: 04/04/24 CLINICAL DATA:  Check central line placement EXAM: PORTABLE CHEST 1 VIEW COMPARISON:  Film from earlier in the same day. FINDINGS: The tracheal tube is again seen. Left jugular central line is noted in the mid to distal SVC. No pneumothorax is seen. Persistent vascular congestion and edema is noted. IMPRESSION: No pneumothorax following central line placement.  The remainder of the exam is stable. Electronically Signed   By: Violeta Grey M.D.   On: 04/03/2024 03:37   DG Chest Port 1 View Result Date:  03/30/2024 CLINICAL DATA:  Status post intubation EXAM: PORTABLE CHEST 1 VIEW COMPARISON:  Film from earlier in the same day. FINDINGS: Cardiac shadow is enlarged but stable. Endotracheal tube is noted in satisfactory position. Increasing airspace opacities are seen consistent with worsening edema. No effusion is seen. No pneumothorax is noted. Bony structures are within normal limits. IMPRESSION: Worsening vascular congestion and pulmonary edema. Endotracheal tube in satisfactory position. Electronically Signed   By: Violeta Grey M.D.   On: 03/18/2024 02:49   CT Angio Chest PE W/Cm &/Or Wo Cm Result Date: 04/04/2024 CLINICAL DATA:  Shortness of breath. Abdominal pain, acute, nonlocalized. EXAM: CT ANGIOGRAPHY CHEST CT ABDOMEN AND PELVIS WITH CONTRAST TECHNIQUE: Multidetector CT imaging of the chest was performed using the standard protocol during bolus administration of intravenous contrast. Multiplanar CT image reconstructions and MIPs were obtained to evaluate the vascular anatomy. Multidetector CT imaging of the abdomen and pelvis was performed using the standard protocol during bolus administration of intravenous contrast. RADIATION DOSE REDUCTION: This exam was performed according to the departmental dose-optimization program which includes automated exposure control, adjustment of the mA and/or kV according to patient size and/or use of iterative reconstruction technique. CONTRAST:  75mL OMNIPAQUE  IOHEXOL  350 MG/ML SOLN COMPARISON:  Portable chest today, portable chest 01/07/2024, CTA chest 01/04/2024, chest CT no contrast 05/12/2021, CT abdomen pelvis with contrast 04/07/2021 and 05/31/2020. FINDINGS: CTA CHEST FINDINGS Cardiovascular: Arterial opacification is diagnostic. No arterial dilatation or embolism is seen. There is moderate cardiomegaly with a left chamber predominance. There is IVC and hepatic vein contrast reflux which could be due to right heart dysfunction or tricuspid regurgitation. Minimal  pericardial effusion is noted but with improvement. Slight dilatation is seen in the ascending aorta, mid segment is 4.0 cm on 6:101. The aorta is tortuous with patchy calcification and otherwise normal caliber. There are scattered calcifications in the great vessels. Aortic opacification is insufficient to assess the lumen. There is prominence in the superior pulmonary veins. Scattered 2 vessel calcification in the LAD and right coronary arteries. There is scattered intravenous air, right chest wall right subclavian vein. This was probably injected with the contrast or at the time of IV insertion. Mediastinum/Nodes: Interval increase in slightly prominent bilateral hilar nodes largest is 1 cm in short axis. Increased prominence of subcarinal nodes largest is 1.5 cm on 3:84. Thyroid gland is obscured by spray artifact from intravenous contrast but was previously unremarkable. Negative thoracic esophagus, thoracic trachea, both main bronchi. Axillary spaces are clear. Lungs/Pleura: There are mild paraseptal emphysematous changes in the lung apices. There is a small right and minimal to trace left layering pleural effusions both improved since 01/04/2024. No pneumothorax. There is diffuse bronchial thickening. Patchy airspace disease in the anterior segment of the right upper lobe is new and consistent with consolidated pneumonia, with interspersed ground-glass pneumonitis in the area. There is additional perihilar airspace disease in the posteromedial aspect of the left upper lobe. There is diffuse bronchial thickening. There is coarse reticulation in left lower lobe base, the area previously obscured by pleural effusion, not seen in 2022 and could indicate asymmetric mild interstitial edema or interstitial lung disease. There was previously an 8 mm right upper lobe ground-glass nodule which has resolved. There was a subsolid 10 mm left upper lobe nodule which is also resolved. There is  mild posterior atelectasis  both lungs. No other active process is seen. Musculoskeletal: There is a chronic healed fracture of right humeral neck. Mild degenerative change dorsal spine. No acute or significant osseous abnormality is seen. There is a progressive paucity of body fat which may indicate cachexia. Review of the MIP images confirms the above findings. CT ABDOMEN and PELVIS FINDINGS Hepatobiliary: No focal liver abnormality is seen. No gallstones, gallbladder wall thickening, or biliary dilatation. Pancreas: No mass enhancement or ductal dilatation. Spleen: No mass.  No splenomegaly. Adrenals/Urinary Tract: Scattered cortical scarring left greater than right kidneys. No adrenal mass. There is no renal mass enhancement. There are striated nephrograms on the delayed phase images, which could be bolus phase related or due to infection such as pyelonephritis. There is no evidence of urinary stones or obstruction. There is no bladder thickening. Stomach/Bowel: The stomach is contracted. The small bowel is normal in caliber. The appendix is normal. There is a 5 cm segment in the distal sigmoid colon demonstrating either circumferential wall thickening or underdistention, on series 5 axial 61-65. Prepped barium enema or colonoscopy is recommended to further evaluate this. Vascular/Lymphatic: Aortic atherosclerosis. No enlarged abdominal or pelvic lymph nodes. Reproductive: The prostate is normal in size and both testicles are in the scrotal sac. Other: There is mild free ascites throughout the abdomen and pelvis but no large drainable pocket. There is diffuse mesenteric haziness. There is generalized body wall edema new from 2021. There is no free air. No incarcerated hernia. Musculoskeletal: Degenerative change with disc space loss and spondylosis L5-S1. No acute or other significant osseous process. Review of the MIP images confirms the above findings. IMPRESSION: 1. No evidence of pulmonary arterial dilatation or embolus. 2.  Cardiomegaly with prominence of the superior pulmonary veins. 3. IVC and hepatic vein contrast reflux which could be due to right heart dysfunction or tricuspid regurgitation. 4. Small right and minimal to trace left pleural effusions improved since 01/04/2024. 5. Patchy airspace disease in the anterior segment of the right upper lobe and posteromedial left upper lobe consistent with consolidated pneumonia. Short interval follow-up CT recommended to ensure clearing. 6. Coarse reticulation in the left lower lobe base, not seen in 2022 and could indicate asymmetric mild interstitial edema or interstitial lung disease. 7. COPD. 8. Interval increase in slightly prominent hilar and subcarinal nodes. 9. Progressive paucity of body fat which may indicate cachexia. Generalized body wall edema new from 2021. 10. Mild free ascites throughout the abdomen and pelvis with diffuse mesenteric haziness, the latter which could be due to congestion, hepatic dysfunction or malnutrition. 11. 5 cm segment of distal sigmoid colon demonstrating either circumferential wall thickening or underdistention. Prepped barium enema or colonoscopy is recommended to further evaluate this. 12. Striated nephrograms on the delayed phase images, which could be bolus phase related or due to infection such as pyelonephritis. No hydronephrosis. 13. Aortic and coronary artery atherosclerosis with 4 cm ascending aortic measurement. Recommend annual imaging followup by CTA or MRA. This recommendation follows 2010 ACCF/AHA/AATS/ACR/ASA/SCA/SCAI/SIR/STS/SVM Guidelines for the Diagnosis and Management of Patients with Thoracic Aortic Disease. Circulation. 2010; 121: W119-J478. Aortic aneurysm NOS (ICD10-I71.9). Aortic Atherosclerosis (ICD10-I70.0) and Emphysema (ICD10-J43.9). Electronically Signed   By: Denman Fischer M.D.   On: 03/13/2024 02:12   CT ABDOMEN PELVIS W CONTRAST Result Date: 03/11/2024 CLINICAL DATA:  Shortness of breath. Abdominal pain, acute,  nonlocalized. EXAM: CT ANGIOGRAPHY CHEST CT ABDOMEN AND PELVIS WITH CONTRAST TECHNIQUE: Multidetector CT imaging of the chest was performed using the standard  protocol during bolus administration of intravenous contrast. Multiplanar CT image reconstructions and MIPs were obtained to evaluate the vascular anatomy. Multidetector CT imaging of the abdomen and pelvis was performed using the standard protocol during bolus administration of intravenous contrast. RADIATION DOSE REDUCTION: This exam was performed according to the departmental dose-optimization program which includes automated exposure control, adjustment of the mA and/or kV according to patient size and/or use of iterative reconstruction technique. CONTRAST:  75mL OMNIPAQUE  IOHEXOL  350 MG/ML SOLN COMPARISON:  Portable chest today, portable chest 01/07/2024, CTA chest 01/04/2024, chest CT no contrast 05/12/2021, CT abdomen pelvis with contrast 04/07/2021 and 05/31/2020. FINDINGS: CTA CHEST FINDINGS Cardiovascular: Arterial opacification is diagnostic. No arterial dilatation or embolism is seen. There is moderate cardiomegaly with a left chamber predominance. There is IVC and hepatic vein contrast reflux which could be due to right heart dysfunction or tricuspid regurgitation. Minimal pericardial effusion is noted but with improvement. Slight dilatation is seen in the ascending aorta, mid segment is 4.0 cm on 6:101. The aorta is tortuous with patchy calcification and otherwise normal caliber. There are scattered calcifications in the great vessels. Aortic opacification is insufficient to assess the lumen. There is prominence in the superior pulmonary veins. Scattered 2 vessel calcification in the LAD and right coronary arteries. There is scattered intravenous air, right chest wall right subclavian vein. This was probably injected with the contrast or at the time of IV insertion. Mediastinum/Nodes: Interval increase in slightly prominent bilateral hilar nodes  largest is 1 cm in short axis. Increased prominence of subcarinal nodes largest is 1.5 cm on 3:84. Thyroid gland is obscured by spray artifact from intravenous contrast but was previously unremarkable. Negative thoracic esophagus, thoracic trachea, both main bronchi. Axillary spaces are clear. Lungs/Pleura: There are mild paraseptal emphysematous changes in the lung apices. There is a small right and minimal to trace left layering pleural effusions both improved since 01/04/2024. No pneumothorax. There is diffuse bronchial thickening. Patchy airspace disease in the anterior segment of the right upper lobe is new and consistent with consolidated pneumonia, with interspersed ground-glass pneumonitis in the area. There is additional perihilar airspace disease in the posteromedial aspect of the left upper lobe. There is diffuse bronchial thickening. There is coarse reticulation in left lower lobe base, the area previously obscured by pleural effusion, not seen in 2022 and could indicate asymmetric mild interstitial edema or interstitial lung disease. There was previously an 8 mm right upper lobe ground-glass nodule which has resolved. There was a subsolid 10 mm left upper lobe nodule which is also resolved. There is mild posterior atelectasis both lungs. No other active process is seen. Musculoskeletal: There is a chronic healed fracture of right humeral neck. Mild degenerative change dorsal spine. No acute or significant osseous abnormality is seen. There is a progressive paucity of body fat which may indicate cachexia. Review of the MIP images confirms the above findings. CT ABDOMEN and PELVIS FINDINGS Hepatobiliary: No focal liver abnormality is seen. No gallstones, gallbladder wall thickening, or biliary dilatation. Pancreas: No mass enhancement or ductal dilatation. Spleen: No mass.  No splenomegaly. Adrenals/Urinary Tract: Scattered cortical scarring left greater than right kidneys. No adrenal mass. There is no  renal mass enhancement. There are striated nephrograms on the delayed phase images, which could be bolus phase related or due to infection such as pyelonephritis. There is no evidence of urinary stones or obstruction. There is no bladder thickening. Stomach/Bowel: The stomach is contracted. The small bowel is normal in caliber. The appendix is  normal. There is a 5 cm segment in the distal sigmoid colon demonstrating either circumferential wall thickening or underdistention, on series 5 axial 61-65. Prepped barium enema or colonoscopy is recommended to further evaluate this. Vascular/Lymphatic: Aortic atherosclerosis. No enlarged abdominal or pelvic lymph nodes. Reproductive: The prostate is normal in size and both testicles are in the scrotal sac. Other: There is mild free ascites throughout the abdomen and pelvis but no large drainable pocket. There is diffuse mesenteric haziness. There is generalized body wall edema new from 2021. There is no free air. No incarcerated hernia. Musculoskeletal: Degenerative change with disc space loss and spondylosis L5-S1. No acute or other significant osseous process. Review of the MIP images confirms the above findings. IMPRESSION: 1. No evidence of pulmonary arterial dilatation or embolus. 2. Cardiomegaly with prominence of the superior pulmonary veins. 3. IVC and hepatic vein contrast reflux which could be due to right heart dysfunction or tricuspid regurgitation. 4. Small right and minimal to trace left pleural effusions improved since 01/04/2024. 5. Patchy airspace disease in the anterior segment of the right upper lobe and posteromedial left upper lobe consistent with consolidated pneumonia. Short interval follow-up CT recommended to ensure clearing. 6. Coarse reticulation in the left lower lobe base, not seen in 2022 and could indicate asymmetric mild interstitial edema or interstitial lung disease. 7. COPD. 8. Interval increase in slightly prominent hilar and subcarinal  nodes. 9. Progressive paucity of body fat which may indicate cachexia. Generalized body wall edema new from 2021. 10. Mild free ascites throughout the abdomen and pelvis with diffuse mesenteric haziness, the latter which could be due to congestion, hepatic dysfunction or malnutrition. 11. 5 cm segment of distal sigmoid colon demonstrating either circumferential wall thickening or underdistention. Prepped barium enema or colonoscopy is recommended to further evaluate this. 12. Striated nephrograms on the delayed phase images, which could be bolus phase related or due to infection such as pyelonephritis. No hydronephrosis. 13. Aortic and coronary artery atherosclerosis with 4 cm ascending aortic measurement. Recommend annual imaging followup by CTA or MRA. This recommendation follows 2010 ACCF/AHA/AATS/ACR/ASA/SCA/SCAI/SIR/STS/SVM Guidelines for the Diagnosis and Management of Patients with Thoracic Aortic Disease. Circulation. 2010; 121: V409-W119. Aortic aneurysm NOS (ICD10-I71.9). Aortic Atherosclerosis (ICD10-I70.0) and Emphysema (ICD10-J43.9). Electronically Signed   By: Denman Fischer M.D.   On: 03/12/2024 02:12   CT Head Wo Contrast Result Date: 03/19/2024 CLINICAL DATA:  Mental status changes unknown cause. Shortness of breath. EXAM: CT HEAD WITHOUT CONTRAST TECHNIQUE: Contiguous axial images were obtained from the base of the skull through the vertex without intravenous contrast. RADIATION DOSE REDUCTION: This exam was performed according to the departmental dose-optimization program which includes automated exposure control, adjustment of the mA and/or kV according to patient size and/or use of iterative reconstruction technique. COMPARISON:  Head CT 01/07/2024 FINDINGS: Brain: There is mild global atrophy, slight atrophic ventriculomegaly and mild small vessel disease in the cerebral white matter. 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 No cortical  based acute infarct, hemorrhage, mass or mass effect are seen. There is no midline shift.  The basal cisterns are clear. Vascular: There is trace pneumocephalus in anterior right temporal fossa. Scattered traces of air in the cavernous sinuses, scattered venous air both masticator spaces as well as anterior to the maxillary sinuses. Source of the air is indeterminate as there is no skull base fracture. There are calcific plaques in both carotid siphons but no hyperdense central vessel is seen. Skull:  No evidence of fractures or focal lesions. Sinuses/Orbits: There has increased prominence a left nasal retention cyst measuring 1.4 cm, previously 1 cm. There is increased membrane disease in the left greater than right maxillary sinuses, increased opacification in the left greater than right ethmoid air cells. The frontal and sphenoid sinus and mastoid air spaces are clear. Nasal septum deviates to the right. Other: None. IMPRESSION: 1. Trace pneumocephalus in the anterior right temporal fossa, scattered traces of air in the cavernous sinuses, scattered venous air in both masticator spaces as well as anterior to the maxillary sinuses. Source of the air is indeterminate as there is no skull base fracture. 2. No other significant intracranial CT findings. Atrophy and small-vessel disease. 3. Increased paranasal sinus membrane disease. Electronically Signed   By: Denman Fischer M.D.   On: 03/27/2024 01:27   DG Chest Port 1 View Result Date: 03/23/2024 CLINICAL DATA:  Shortness of breath EXAM: PORTABLE CHEST 1 VIEW COMPARISON:  01/07/2024 FINDINGS: Cardiomegaly, vascular congestion. No confluent opacities or effusions. No overt edema. No acute bony abnormality. IMPRESSION: Cardiomegaly, vascular congestion Electronically Signed   By: Janeece Mechanic M.D.   On: 03/19/2024 00:39    Microbiology Recent Results (from the past 240 hours)  Blood Culture (routine x 2)     Status: None (Preliminary result)   Collection Time:  04/07/2024 12:10 AM   Specimen: BLOOD  Result Value Ref Range Status   Specimen Description BLOOD RIGHT ANTECUBITAL  Final   Special Requests   Final    BOTTLES DRAWN AEROBIC AND ANAEROBIC Blood Culture results may not be optimal due to an inadequate volume of blood received in culture bottles   Culture   Final    NO GROWTH < 12 HOURS Performed at [facility], [address], [place], [place] [address]    Report Status PENDING  Incomplete  Blood Culture (routine x 2)     Status: None (Preliminary result)   Collection Time: 03/13/2024 12:20 AM   Specimen: BLOOD  Result Value Ref Range Status   Specimen Description BLOOD LEFT ANTECUBITAL  Final   Special Requests   Final    BOTTLES DRAWN AEROBIC AND ANAEROBIC Blood Culture results may not be optimal due to an inadequate volume of blood received in culture bottles   Culture   Final    NO GROWTH < 12 HOURS Performed at [facility], [address], [place], [place] [address]    Report Status PENDING  Incomplete  MRSA Next Gen by PCR, Nasal     Status: Abnormal   Collection Time: 03/28/2024  4:39 AM   Specimen: Nasal Mucosa; Nasal Swab  Result Value Ref Range Status   MRSA by PCR Next Gen DETECTED (A) NOT DETECTED Final    Comment: RESULT CALLED TO, READ BACK BY AND VERIFIED WITH: RN Linzy Ricks [id number] (NOTE) The GeneXpert MRSA Assay (FDA approved for NASAL specimens only), is one component of a comprehensive MRSA colonization surveillance program. It is not intended to diagnose MRSA infection nor to guide or monitor treatment for MRSA infections. Test performance is not FDA approved in patients less than 35 years old. Performed at [facility], [address] [place], [place] [address]     Lab Basic Metabolic Panel: Recent Labs  Lab 03/24/2024 0010 03/12/2024 0036 03/27/2024 0332 03/11/2024 0334 03/17/2024 0453 03/23/2024 0753 03/13/2024 0930  NA 136 137  137 134* 135 146* 138 145  K 4.9 4.9  4.9 5.8*  5.7* 5.6*  5.3* 3.9  CL 105 107  --  105  --   --   --   CO2 16*  --   --  14*  --   --   --   GLUCOSE 134* 90  --  235*  --   --   --   BUN 22 23  --  23  --   --   --   CREATININE 1.65* 1.50*  --  1.67*  --   --   --   CALCIUM 8.0*  --   --  8.4*  --   --   --   MG  --   --   --  2.3  --   --   --   PHOS  --   --   --  9.4*  --   --   --    Liver Function Tests: Recent Labs  Lab 03/17/2024 0010 03/30/2024 0334  AST 97* 433*  ALT 61* 236*  ALKPHOS 95 110  BILITOT 1.9* 1.9*  PROT 5.2* 4.2*  ALBUMIN 2.8* 2.1*   Recent Labs  Lab 03/29/2024 0010  LIPASE 20   Recent Labs  Lab 03/28/2024 0011  AMMONIA 17   CBC: Recent Labs  Lab 03/23/2024 0010 03/14/2024 0036 04/03/2024 0332 03/29/2024 0453 03/15/2024 0512 03/18/2024 0753 03/24/2024 0930  WBC 7.9  --   --   --  7.7  --   --   NEUTROABS 5.8  --   --   --   --   --   --   HGB 12.2*   < > 10.9* 10.2* 11.1* 12.2* 9.9*  HCT 39.3   < > 32.0* 30.0* 37.5* 36.0* 29.0*  MCV 88.5  --   --   --  91.2  --   --   PLT 138*  --   --   --  106*  --   --    < > = values in this interval not displayed.   Cardiac Enzymes: No results for input(s): CKTOTAL, CKMB, CKMBINDEX, TROPONINI in the last 168 hours. Sepsis Labs: Recent Labs  Lab 03/31/2024 0010 03/18/2024 0036 04/09/2024 0334 03/19/2024 0512  WBC 7.9  --   --  7.7  LATICACIDVEN  --  6.1* >9.0*  --     Procedures/Operations  Intubation CVC Aline   Joesph Mussel 04/06/2024, 2:41 PM

## 2024-04-10 NOTE — Consult Note (Signed)
 Advanced Heart Failure Team Consult Note   Primary Physician: Aurora Lees, DO Cardiologist:  Jules Oar, MD  Reason for Consultation: Cardiogenic shock  HPI:    Connor Solomon is seen today for evaluation of cardiogenic shock at the request of Dr. Fulton Job  Connor Solomon is 63 y/o male with advanced systolic HF EF < 40%, COPD and Waldenstrom's macroglobulinemia.   Last seen in HF Clinic in 12/22.   More recently followed in oncology clinic and receiving therapy for WM  Admitted in 4/25 for ADHF but has not f/u in Clinic  Has had poor QoL with progressive disease. Met with Hospice Team in Oncologyon 03/02/24 with plan to treat the treatable but would not want more aggressive therapy.   Admitted with cardiogenic shock. Developed PEA arrest in ER with 10 mins of CPR prior to ROSC  Now intubated on high-dose pressors with pH 6.9  Bedside echo done personally. EF < 15%  Home Medications Prior to Admission medications   Medication Sig Start Date End Date Taking? Authorizing Provider  acetaminophen  (TYLENOL ) 500 MG tablet Take 500 mg by mouth every 6 (six) hours as needed.    [provider]  albuterol  (PROAIR  HFA) 108 (90 Base) MCG/ACT inhaler Inhale 2 puffs into the lungs every 6 (six) hours as needed for wheezing or shortness of breath. 05/30/23   Aurora Lees, DO  aspirin  EC 325 MG tablet Take 1 tablet (325 mg total) by mouth daily. 01/11/24   Amin, Ankit C, MD  carvedilol  (COREG ) 3.125 MG tablet Take 1 tablet (3.125 mg total) by mouth 2 (two) times daily. NEEDS FOLLOW UP APPOINTMENT FOR MORE REFILLS 01/11/24   Jaqua Ching, Rheta Celestine, MD  chlorhexidine  (HIBICLENS ) 4 % external liquid Apply topically daily as needed. Dilute with water and clean wound with this.  Rinse thoroughly afterwards Patient not taking: Reported on 01/04/2024 05/24/23   Ethlyn Herd, MD  clobetasol  cream (TEMOVATE ) 0.05 % Apply 1 Application topically 2 (two) times daily. Apply topically to the arms  and hands twice daily for up to two weeks 01/05/23   Dellar Fenton, DO  colchicine  0.6 MG tablet Take 1 tablet (0.6 mg total) by mouth 2 (two) times daily. 01/10/24   Amin, Ankit C, MD  dapagliflozin  propanediol (FARXIGA ) 10 MG TABS tablet Take 1 tablet (10 mg total) by mouth daily before breakfast. NEEDS FOLLOW UP APPOINTMENT FOR ANYMORE REFILLS 08/15/23   Sabra Sessler, Rheta Celestine, MD  diazepam  (VALIUM ) 5 MG tablet Take 1 tablet (5 mg total) by mouth every 12 (twelve) hours as needed for anxiety. 03/15/24   Pickenpack-Cousar, Giles Labrum, NP  DULoxetine  (CYMBALTA ) 30 MG capsule Take 1 capsule (30 mg total) by mouth daily. 01/10/24   Amin, Ankit C, MD  eplerenone  (INSPRA ) 25 MG tablet Take 0.5 tablets (12.5 mg total) by mouth daily. NEEDS FOLLOW UP APPOINTMENT FOR MORE REFILLS 01/10/24   Maggie Schooner, MD  famotidine  (PEPCID ) 20 MG tablet Take 1 tablet (20 mg total) by mouth at bedtime. 01/10/24 04/09/24  Maggie Schooner, MD  ferrous sulfate  (FEROSUL) 325 (65 FE) MG tablet Take 1 tablet by mouth daily with breakfast. Please take with a source of Vitamin C 03/14/23   Dorsey, John T IV, MD  furosemide  (LASIX ) 40 MG tablet Take 1 tablet (40 mg total) by mouth daily as needed for fluid or edema. 01/10/24   Amin, Ankit C, MD  ibrutinib  (IMBRUVICA ) 420 MG tablet Take 1 tablet (420 mg total) by mouth daily.  02/15/24   Ander Bame, MD  methadone  (DOLOPHINE ) 10 MG tablet Take 1 tablet (10 mg total) by mouth every 8 (eight) hours. 03/02/24   Pickenpack-Cousar, Athena N, NP  naloxone  (NARCAN ) nasal spray 4 mg/0.1 mL Take for overdose 01/23/22   Rosealee Concha, MD  naloxone  (NARCAN ) nasal spray 4 mg/0.1 mL Take for overdose as directed 01/23/22   Rosealee Concha, MD  ondansetron  (ZOFRAN ) 8 MG tablet Take 1 tablet (8 mg) by mouth every 8 hours as needed. 06/06/23   Pickenpack-Cousar, Athena N, NP  Oxycodone  HCl 10 MG TABS Take 1 tablet (10 mg total) by mouth every 4 (four) hours as needed. 03/10/24   Pickenpack-Cousar, Athena N, NP   sacubitril -valsartan  (ENTRESTO ) 24-26 MG Take 1 tablet by mouth 2 (two) times daily. 01/10/24   Amin, Ankit C, MD  sildenafil  (VIAGRA ) 50 MG tablet Take 1 tablet (50 mg total) by mouth daily as needed for erectile dysfunction. 11/04/23   Ander Bame, MD    Past Medical History: Past Medical History:  Diagnosis Date   Acute heart failure (HCC) 04/08/2021   Acute HFrEF (heart failure with reduced ejection fraction) (HCC) 04/07/2021   Arthritis    hands, elbows bilaterally   Cancer (HCC)    Full dentures    Hypertension    Pt states he doen not have HTN and has never been treated for HTN   Kidney stone on left side last stone 4-5 yrs ago   recurrent (3 episodes)   Panic disorder    pt states has resolved   Syncope    resolved- was related to panic attacks    Past Surgical History: Past Surgical History:  Procedure Laterality Date   CARPAL METACARPAL FUSION WITH DISTAL RADIAL BONE GRAFT Left 05/30/2013   Procedure: LEFT THUMB METACARPAL JOINT FUSION;  Surgeon: Hedy Living, MD;  Location: [facility];  Service: Orthopedics;  Laterality: Left;   ESOPHAGOGASTRODUODENOSCOPY N/A 02/12/2014   Procedure: ESOPHAGOGASTRODUODENOSCOPY (EGD);  Surgeon: Nannette Babe, MD;  Location: [facility] ENDOSCOPY;  Service: Endoscopy;  Laterality: N/A;   FOREIGN BODY REMOVAL N/A 02/12/2014   Procedure: FOREIGN BODY REMOVAL;  Surgeon: Nannette Babe, MD;  Location: [facility] ENDOSCOPY;  Service: Endoscopy;  Laterality: N/A;   OPEN REDUCTION INTERNAL FIXATION (ORIF) DISTAL RADIAL FRACTURE Left 11/29/2012   Procedure: OPEN REDUCTION INTERNAL FIXATION (ORIF) DISTAL RADIAL FRACTURE;  Surgeon: Hedy Living, MD;  Location: [facility];  Service: Orthopedics;  Laterality: Left;  LEFT DISTAL RADIUS OSTEOTOMY WITH BONE GRAFT   RIGHT/LEFT HEART CATH AND CORONARY ANGIOGRAPHY N/A 04/09/2021   Procedure: RIGHT/LEFT HEART CATH AND CORONARY ANGIOGRAPHY;  Surgeon: Mardell Shade, MD;  Location: [facility]  INVASIVE CV LAB;  Service: Cardiovascular;  Laterality: N/A;   TENDON REPAIR Left 02/07/2013   Procedure: LEFT EXTENSOR INDICIS PROPRIUS  TO EXTENSOR POLLICIS LONGUS TENDON TRANSFER;  Surgeon: Hedy Living, MD;  Location: [facility];  Service: Orthopedics;  Laterality: Left;   WISDOM TOOTH EXTRACTION     WRIST SURGERY     fx only    Family History: Family History  Problem Relation Age of Onset   Heart attack Father    Sudden Cardiac Death Father 72   Diabetes Neg Hx    Hyperlipidemia Neg Hx    Hypertension Neg Hx     Social History: Social History   Socioeconomic History   Marital status: Single    Spouse name: Not on file   Number  of children: Not on file   Years of education: Not on file   Highest education level: Not on file  Occupational History   Not on file  Tobacco Use   Smoking status: Former    Types: Cigars    Quit date: 10/11/2020    Years since quitting: 3.4   Smokeless tobacco: Never   Tobacco comments:    smokes 1 cigar a week  Vaping Use   Vaping status: Never Used  Substance and Sexual Activity   Alcohol use: No   Drug use: No   Sexual activity: Not on file  Other Topics Concern   Not on file  Social History Narrative   Not on file   Social Drivers of Health   Financial Resource Strain: High Risk (07/17/2021)   Overall Financial Resource Strain (CARDIA)    Difficulty of Paying Living Expenses: Very hard  Food Insecurity: No Food Insecurity (01/04/2024)   Hunger Vital Sign    Worried About Running Out of Food in the Last Year: Never true    Ran Out of Food in the Last Year: Never true  Transportation Needs: No Transportation Needs (01/04/2024)   PRAPARE - Administrator, Civil Service (Medical): No    Lack of Transportation (Non-Medical): No  Physical Activity: Not on file  Stress: Stress Concern Present (09/01/2021)   [employer] of Occupational Health - Occupational Stress Questionnaire    Feeling of  Stress : Very much  Social Connections: Not on file    Allergies:  Allergies  Allergen Reactions   Rituxan  [Rituximab ] Other (See Comments) and Cough    Mild chest discomfort & rigors   Hydrocodone  Itching and Nausea And Vomiting    Objective:    Vital Signs:   Temp:  [95 F (35 C)-97.5 F (36.4 C)] 95 F (35 C) (06/11 0800) Pulse Rate:  [25-109] 88 (06/11 1000) Resp:  [6-34] 29 (06/11 1000) BP: (81-158)/(28-119) 81/59 (06/11 1000) SpO2:  [58 %-100 %] 58 % (06/11 1000) Arterial Line BP: (70-124)/(42-82) 72/42 (06/11 1000) FiO2 (%):  [100 %] 100 % (06/11 0825) Weight:  [55 kg-62 kg] 62 kg (06/11 0500) Last BM Date :  (PTA)  Weight change: Filed Weights   03/30/2024 0004 03/12/2024 0500  Weight: 55 kg 62 kg    Intake/Output:   Intake/Output Summary (Last 24 hours) at 04/09/2024 1013 Last data filed at 03/15/2024 1000 Gross per 24 hour  Intake 3439.69 ml  Output 25 ml  Net 3414.69 ml      Physical Exam    General:  Intubated/sedated Critically ill  HEENT: normal + ETT Neck: supple.  Cor: RRR + s3 Lungs: clear Abdomen: soft, nontender, nondistended. No hepatosplenomegaly. No bruits or masses. Good bowel sounds. Extremities: no cyanosis, clubbing, rash, edema cool mottled Neuro: intubated/sedated   Telemetry   Sinus 80-90s Personally reviewed   EKG    Sinus 93 nonspecific TWI Personally reviewed   Labs   Basic Metabolic Panel: Recent Labs  Lab 03/28/2024 0010 03/16/2024 0036 03/18/2024 0332 03/24/2024 0334 03/13/2024 0453 04/06/2024 0753 04/04/2024 0930  NA 136 137  137 134* 135 146* 138 145  K 4.9 4.9  4.9 5.8* 5.7* 5.6* 5.3* 3.9  CL 105 107  --  105  --   --   --   CO2 16*  --   --  14*  --   --   --   GLUCOSE 134* 90  --  235*  --   --   --  BUN 22 23  --  23  --   --   --   CREATININE 1.65* 1.50*  --  1.67*  --   --   --   CALCIUM 8.0*  --   --  8.4*  --   --   --   MG  --   --   --  2.3  --   --   --   PHOS  --   --   --  9.4*  --   --   --      Liver Function Tests: Recent Labs  Lab 04/02/2024 0010 03/15/2024 0334  AST 97* 433*  ALT 61* 236*  ALKPHOS 95 110  BILITOT 1.9* 1.9*  PROT 5.2* 4.2*  ALBUMIN 2.8* 2.1*   Recent Labs  Lab 03/24/2024 0010  LIPASE 20   Recent Labs  Lab 04/06/2024 0011  AMMONIA 17    CBC: Recent Labs  Lab 03/26/2024 0010 03/27/2024 0036 03/18/2024 0332 04/07/2024 0453 03/18/2024 0512 03/15/2024 0753 03/25/2024 0930  WBC 7.9  --   --   --  7.7  --   --   NEUTROABS 5.8  --   --   --   --   --   --   HGB 12.2*   < > 10.9* 10.2* 11.1* 12.2* 9.9*  HCT 39.3   < > 32.0* 30.0* 37.5* 36.0* 29.0*  MCV 88.5  --   --   --  91.2  --   --   PLT 138*  --   --   --  106*  --   --    < > = values in this interval not displayed.    Cardiac Enzymes: No results for input(s): CKTOTAL, CKMB, CKMBINDEX, TROPONINI in the last 168 hours.  BNP: BNP (last 3 results) Recent Labs    01/04/24 0425 04/08/2024 0010  BNP 1,647.7* 2,024.6*    ProBNP (last 3 results) No results for input(s): PROBNP in the last 8760 hours.   CBG: Recent Labs  Lab 04/09/2024 0025 04/07/2024 0118 03/30/2024 0219 03/12/2024 0440 03/25/2024 0809  GLUCAP 116* 113* 84 119* 102*    Coagulation Studies: Recent Labs    03/31/2024 0010 03/18/2024 0512  LABPROT 16.8* 26.4*  INR 1.3* 2.4*     Imaging   DG Chest Portable 1 View Result Date: 03/29/2024 CLINICAL DATA:  Check central line placement EXAM: PORTABLE CHEST 1 VIEW COMPARISON:  Film from earlier in the same day. FINDINGS: The tracheal tube is again seen. Left jugular central line is noted in the mid to distal SVC. No pneumothorax is seen. Persistent vascular congestion and edema is noted. IMPRESSION: No pneumothorax following central line placement. The remainder of the exam is stable. Electronically Signed   By: Violeta Grey M.D.   On: 03/19/2024 03:37   DG Chest Port 1 View Result Date: 04/07/2024 CLINICAL DATA:  Status post intubation EXAM: PORTABLE CHEST 1 VIEW COMPARISON:   Film from earlier in the same day. FINDINGS: Cardiac shadow is enlarged but stable. Endotracheal tube is noted in satisfactory position. Increasing airspace opacities are seen consistent with worsening edema. No effusion is seen. No pneumothorax is noted. Bony structures are within normal limits. IMPRESSION: Worsening vascular congestion and pulmonary edema. Endotracheal tube in satisfactory position. Electronically Signed   By: Violeta Grey M.D.   On: 03/26/2024 02:49   CT Angio Chest PE W/Cm &/Or Wo Cm Result Date: 04/04/2024 CLINICAL DATA:  Shortness of breath. Abdominal pain, acute,  nonlocalized. EXAM: CT ANGIOGRAPHY CHEST CT ABDOMEN AND PELVIS WITH CONTRAST TECHNIQUE: Multidetector CT imaging of the chest was performed using the standard protocol during bolus administration of intravenous contrast. Multiplanar CT image reconstructions and MIPs were obtained to evaluate the vascular anatomy. Multidetector CT imaging of the abdomen and pelvis was performed using the standard protocol during bolus administration of intravenous contrast. RADIATION DOSE REDUCTION: This exam was performed according to the departmental dose-optimization program which includes automated exposure control, adjustment of the mA and/or kV according to patient size and/or use of iterative reconstruction technique. CONTRAST:  75mL OMNIPAQUE  IOHEXOL  350 MG/ML SOLN COMPARISON:  Portable chest today, portable chest 01/07/2024, CTA chest 01/04/2024, chest CT no contrast 05/12/2021, CT abdomen pelvis with contrast 04/07/2021 and 05/31/2020. FINDINGS: CTA CHEST FINDINGS Cardiovascular: Arterial opacification is diagnostic. No arterial dilatation or embolism is seen. There is moderate cardiomegaly with a left chamber predominance. There is IVC and hepatic vein contrast reflux which could be due to right heart dysfunction or tricuspid regurgitation. Minimal pericardial effusion is noted but with improvement. Slight dilatation is seen in the  ascending aorta, mid segment is 4.0 cm on 6:101. The aorta is tortuous with patchy calcification and otherwise normal caliber. There are scattered calcifications in the great vessels. Aortic opacification is insufficient to assess the lumen. There is prominence in the superior pulmonary veins. Scattered 2 vessel calcification in the LAD and right coronary arteries. There is scattered intravenous air, right chest wall right subclavian vein. This was probably injected with the contrast or at the time of IV insertion. Mediastinum/Nodes: Interval increase in slightly prominent bilateral hilar nodes largest is 1 cm in short axis. Increased prominence of subcarinal nodes largest is 1.5 cm on 3:84. Thyroid gland is obscured by spray artifact from intravenous contrast but was previously unremarkable. Negative thoracic esophagus, thoracic trachea, both main bronchi. Axillary spaces are clear. Lungs/Pleura: There are mild paraseptal emphysematous changes in the lung apices. There is a small right and minimal to trace left layering pleural effusions both improved since 01/04/2024. No pneumothorax. There is diffuse bronchial thickening. Patchy airspace disease in the anterior segment of the right upper lobe is new and consistent with consolidated pneumonia, with interspersed ground-glass pneumonitis in the area. There is additional perihilar airspace disease in the posteromedial aspect of the left upper lobe. There is diffuse bronchial thickening. There is coarse reticulation in left lower lobe base, the area previously obscured by pleural effusion, not seen in 2022 and could indicate asymmetric mild interstitial edema or interstitial lung disease. There was previously an 8 mm right upper lobe ground-glass nodule which has resolved. There was a subsolid 10 mm left upper lobe nodule which is also resolved. There is mild posterior atelectasis both lungs. No other active process is seen. Musculoskeletal: There is a chronic healed  fracture of right humeral neck. Mild degenerative change dorsal spine. No acute or significant osseous abnormality is seen. There is a progressive paucity of body fat which may indicate cachexia. Review of the MIP images confirms the above findings. CT ABDOMEN and PELVIS FINDINGS Hepatobiliary: No focal liver abnormality is seen. No gallstones, gallbladder wall thickening, or biliary dilatation. Pancreas: No mass enhancement or ductal dilatation. Spleen: No mass.  No splenomegaly. Adrenals/Urinary Tract: Scattered cortical scarring left greater than right kidneys. No adrenal mass. There is no renal mass enhancement. There are striated nephrograms on the delayed phase images, which could be bolus phase related or due to infection such as pyelonephritis. There is no evidence of urinary  stones or obstruction. There is no bladder thickening. Stomach/Bowel: The stomach is contracted. The small bowel is normal in caliber. The appendix is normal. There is a 5 cm segment in the distal sigmoid colon demonstrating either circumferential wall thickening or underdistention, on series 5 axial 61-65. Prepped barium enema or colonoscopy is recommended to further evaluate this. Vascular/Lymphatic: Aortic atherosclerosis. No enlarged abdominal or pelvic lymph nodes. Reproductive: The prostate is normal in size and both testicles are in the scrotal sac. Other: There is mild free ascites throughout the abdomen and pelvis but no large drainable pocket. There is diffuse mesenteric haziness. There is generalized body wall edema new from 2021. There is no free air. No incarcerated hernia. Musculoskeletal: Degenerative change with disc space loss and spondylosis L5-S1. No acute or other significant osseous process. Review of the MIP images confirms the above findings. IMPRESSION: 1. No evidence of pulmonary arterial dilatation or embolus. 2. Cardiomegaly with prominence of the superior pulmonary veins. 3. IVC and hepatic vein contrast  reflux which could be due to right heart dysfunction or tricuspid regurgitation. 4. Small right and minimal to trace left pleural effusions improved since 01/04/2024. 5. Patchy airspace disease in the anterior segment of the right upper lobe and posteromedial left upper lobe consistent with consolidated pneumonia. Short interval follow-up CT recommended to ensure clearing. 6. Coarse reticulation in the left lower lobe base, not seen in 2022 and could indicate asymmetric mild interstitial edema or interstitial lung disease. 7. COPD. 8. Interval increase in slightly prominent hilar and subcarinal nodes. 9. Progressive paucity of body fat which may indicate cachexia. Generalized body wall edema new from 2021. 10. Mild free ascites throughout the abdomen and pelvis with diffuse mesenteric haziness, the latter which could be due to congestion, hepatic dysfunction or malnutrition. 11. 5 cm segment of distal sigmoid colon demonstrating either circumferential wall thickening or underdistention. Prepped barium enema or colonoscopy is recommended to further evaluate this. 12. Striated nephrograms on the delayed phase images, which could be bolus phase related or due to infection such as pyelonephritis. No hydronephrosis. 13. Aortic and coronary artery atherosclerosis with 4 cm ascending aortic measurement. Recommend annual imaging followup by CTA or MRA. This recommendation follows 2010 ACCF/AHA/AATS/ACR/ASA/SCA/SCAI/SIR/STS/SVM Guidelines for the Diagnosis and Management of Patients with Thoracic Aortic Disease. Circulation. 2010; 121: Z610-R604. Aortic aneurysm NOS (ICD10-I71.9). Aortic Atherosclerosis (ICD10-I70.0) and Emphysema (ICD10-J43.9). Electronically Signed   By: Denman Fischer M.D.   On: 03/30/2024 02:12   CT ABDOMEN PELVIS W CONTRAST Result Date: 04/01/2024 CLINICAL DATA:  Shortness of breath. Abdominal pain, acute, nonlocalized. EXAM: CT ANGIOGRAPHY CHEST CT ABDOMEN AND PELVIS WITH CONTRAST TECHNIQUE:  Multidetector CT imaging of the chest was performed using the standard protocol during bolus administration of intravenous contrast. Multiplanar CT image reconstructions and MIPs were obtained to evaluate the vascular anatomy. Multidetector CT imaging of the abdomen and pelvis was performed using the standard protocol during bolus administration of intravenous contrast. RADIATION DOSE REDUCTION: This exam was performed according to the departmental dose-optimization program which includes automated exposure control, adjustment of the mA and/or kV according to patient size and/or use of iterative reconstruction technique. CONTRAST:  75mL OMNIPAQUE  IOHEXOL  350 MG/ML SOLN COMPARISON:  Portable chest today, portable chest 01/07/2024, CTA chest 01/04/2024, chest CT no contrast 05/12/2021, CT abdomen pelvis with contrast 04/07/2021 and 05/31/2020. FINDINGS: CTA CHEST FINDINGS Cardiovascular: Arterial opacification is diagnostic. No arterial dilatation or embolism is seen. There is moderate cardiomegaly with a left chamber predominance. There is IVC and hepatic vein  contrast reflux which could be due to right heart dysfunction or tricuspid regurgitation. Minimal pericardial effusion is noted but with improvement. Slight dilatation is seen in the ascending aorta, mid segment is 4.0 cm on 6:101. The aorta is tortuous with patchy calcification and otherwise normal caliber. There are scattered calcifications in the great vessels. Aortic opacification is insufficient to assess the lumen. There is prominence in the superior pulmonary veins. Scattered 2 vessel calcification in the LAD and right coronary arteries. There is scattered intravenous air, right chest wall right subclavian vein. This was probably injected with the contrast or at the time of IV insertion. Mediastinum/Nodes: Interval increase in slightly prominent bilateral hilar nodes largest is 1 cm in short axis. Increased prominence of subcarinal nodes largest is 1.5  cm on 3:84. Thyroid gland is obscured by spray artifact from intravenous contrast but was previously unremarkable. Negative thoracic esophagus, thoracic trachea, both main bronchi. Axillary spaces are clear. Lungs/Pleura: There are mild paraseptal emphysematous changes in the lung apices. There is a small right and minimal to trace left layering pleural effusions both improved since 01/04/2024. No pneumothorax. There is diffuse bronchial thickening. Patchy airspace disease in the anterior segment of the right upper lobe is new and consistent with consolidated pneumonia, with interspersed ground-glass pneumonitis in the area. There is additional perihilar airspace disease in the posteromedial aspect of the left upper lobe. There is diffuse bronchial thickening. There is coarse reticulation in left lower lobe base, the area previously obscured by pleural effusion, not seen in 2022 and could indicate asymmetric mild interstitial edema or interstitial lung disease. There was previously an 8 mm right upper lobe ground-glass nodule which has resolved. There was a subsolid 10 mm left upper lobe nodule which is also resolved. There is mild posterior atelectasis both lungs. No other active process is seen. Musculoskeletal: There is a chronic healed fracture of right humeral neck. Mild degenerative change dorsal spine. No acute or significant osseous abnormality is seen. There is a progressive paucity of body fat which may indicate cachexia. Review of the MIP images confirms the above findings. CT ABDOMEN and PELVIS FINDINGS Hepatobiliary: No focal liver abnormality is seen. No gallstones, gallbladder wall thickening, or biliary dilatation. Pancreas: No mass enhancement or ductal dilatation. Spleen: No mass.  No splenomegaly. Adrenals/Urinary Tract: Scattered cortical scarring left greater than right kidneys. No adrenal mass. There is no renal mass enhancement. There are striated nephrograms on the delayed phase images, which  could be bolus phase related or due to infection such as pyelonephritis. There is no evidence of urinary stones or obstruction. There is no bladder thickening. Stomach/Bowel: The stomach is contracted. The small bowel is normal in caliber. The appendix is normal. There is a 5 cm segment in the distal sigmoid colon demonstrating either circumferential wall thickening or underdistention, on series 5 axial 61-65. Prepped barium enema or colonoscopy is recommended to further evaluate this. Vascular/Lymphatic: Aortic atherosclerosis. No enlarged abdominal or pelvic lymph nodes. Reproductive: The prostate is normal in size and both testicles are in the scrotal sac. Other: There is mild free ascites throughout the abdomen and pelvis but no large drainable pocket. There is diffuse mesenteric haziness. There is generalized body wall edema new from 2021. There is no free air. No incarcerated hernia. Musculoskeletal: Degenerative change with disc space loss and spondylosis L5-S1. No acute or other significant osseous process. Review of the MIP images confirms the above findings. IMPRESSION: 1. No evidence of pulmonary arterial dilatation or embolus. 2. Cardiomegaly with  prominence of the superior pulmonary veins. 3. IVC and hepatic vein contrast reflux which could be due to right heart dysfunction or tricuspid regurgitation. 4. Small right and minimal to trace left pleural effusions improved since 01/04/2024. 5. Patchy airspace disease in the anterior segment of the right upper lobe and posteromedial left upper lobe consistent with consolidated pneumonia. Short interval follow-up CT recommended to ensure clearing. 6. Coarse reticulation in the left lower lobe base, not seen in 2022 and could indicate asymmetric mild interstitial edema or interstitial lung disease. 7. COPD. 8. Interval increase in slightly prominent hilar and subcarinal nodes. 9. Progressive paucity of body fat which may indicate cachexia. Generalized body wall  edema new from 2021. 10. Mild free ascites throughout the abdomen and pelvis with diffuse mesenteric haziness, the latter which could be due to congestion, hepatic dysfunction or malnutrition. 11. 5 cm segment of distal sigmoid colon demonstrating either circumferential wall thickening or underdistention. Prepped barium enema or colonoscopy is recommended to further evaluate this. 12. Striated nephrograms on the delayed phase images, which could be bolus phase related or due to infection such as pyelonephritis. No hydronephrosis. 13. Aortic and coronary artery atherosclerosis with 4 cm ascending aortic measurement. Recommend annual imaging followup by CTA or MRA. This recommendation follows 2010 ACCF/AHA/AATS/ACR/ASA/SCA/SCAI/SIR/STS/SVM Guidelines for the Diagnosis and Management of Patients with Thoracic Aortic Disease. Circulation. 2010; 121: Z610-R604. Aortic aneurysm NOS (ICD10-I71.9). Aortic Atherosclerosis (ICD10-I70.0) and Emphysema (ICD10-J43.9). Electronically Signed   By: Denman Fischer M.D.   On: 04/08/2024 02:12   CT Head Wo Contrast Result Date: 03/23/2024 CLINICAL DATA:  Mental status changes unknown cause. Shortness of breath. EXAM: CT HEAD WITHOUT CONTRAST TECHNIQUE: Contiguous axial images were obtained from the base of the skull through the vertex without intravenous contrast. RADIATION DOSE REDUCTION: This exam was performed according to the departmental dose-optimization program which includes automated exposure control, adjustment of the mA and/or kV according to patient size and/or use of iterative reconstruction technique. COMPARISON:  Head CT 01/07/2024 FINDINGS: Brain: There is mild global atrophy, slight atrophic ventriculomegaly and mild small vessel disease in the cerebral white matter. 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 No cortical based acute infarct, hemorrhage, mass or mass effect are seen. There is no midline shift.  The  basal cisterns are clear. Vascular: There is trace pneumocephalus in anterior right temporal fossa. Scattered traces of air in the cavernous sinuses, scattered venous air both masticator spaces as well as anterior to the maxillary sinuses. Source of the air is indeterminate as there is no skull base fracture. There are calcific plaques in both carotid siphons but no hyperdense central vessel is seen. Skull: No evidence of fractures or focal lesions. Sinuses/Orbits: There has increased prominence a left nasal retention cyst measuring 1.4 cm, previously 1 cm. There is increased membrane disease in the left greater than right maxillary sinuses, increased opacification in the left greater than right ethmoid air cells. The frontal and sphenoid sinus and mastoid air spaces are clear. Nasal septum deviates to the right. Other: None. IMPRESSION: 1. Trace pneumocephalus in the anterior right temporal fossa, scattered traces of air in the cavernous sinuses, scattered venous air in both masticator spaces as well as anterior to the maxillary sinuses. Source of the air is indeterminate as there is no skull base fracture. 2. No other significant intracranial CT findings. Atrophy and small-vessel disease. 3. Increased paranasal sinus membrane disease. Electronically Signed   By: Denman Fischer  M.D.   On: 04/02/2024 01:27   DG Chest Port 1 View Result Date: 04/02/2024 CLINICAL DATA:  Shortness of breath EXAM: PORTABLE CHEST 1 VIEW COMPARISON:  01/07/2024 FINDINGS: Cardiomegaly, vascular congestion. No confluent opacities or effusions. No overt edema. No acute bony abnormality. IMPRESSION: Cardiomegaly, vascular congestion Electronically Signed   By: Janeece Mechanic M.D.   On: 03/13/2024 00:39     Medications:     Current Medications:  heparin   5,000 Units Subcutaneous Q8H   insulin aspart  0-9 Units Subcutaneous Q4H   ipratropium-albuterol   3 mL Nebulization QID   methylPREDNISolone  (SOLU-MEDROL ) injection  125 mg  Intravenous Q12H   pantoprazole  (PROTONIX ) IV  40 mg Intravenous QHS   sodium zirconium cyclosilicate  5 g Per Tube Once    Infusions:  sodium chloride      azithromycin      ceFEPime (MAXIPIME) IV 200 mL/hr at 03/30/2024 1000   DOBUTamine 5 mcg/kg/min (04/05/2024 1000)   epinephrine  50 mcg/min (04/09/2024 1000)   fentaNYL  infusion INTRAVENOUS 15 mcg/hr (03/13/2024 1000)   norepinephrine (LEVOPHED) Adult infusion 50 mcg/min (04/04/2024 1000)   sodium bicarbonate 150 mEq in dextrose  5 % 1,150 mL infusion 150 mL/hr at 04/02/2024 1000     Assessment/Plan   1. Cardiogenic shock due to NICM - EF < 15% 2. PEA arrest 3. Acute hypoxic respiratory failure 4. Waldenstrom's macroglobulinemia  He is terminally ill with progressive, end-stage HF with no durable options for cardiac support. He is now on high-dose pressors with evidence of persistent hypoperfusion.  Unfortunately, I think further aggressive care would be futile and also would be in contrast to wishes he expressed to Hospice NP recently.   Would strongly consider transition to comfort care/   D/w CCM team at bedside  CRITICAL CARE Performed by: Jhoan Schmieder  Total critical care time: 50 minutes  Critical care time was exclusive of separately billable procedures and treating other patients.  Critical care was necessary to treat or prevent imminent or life-threatening deterioration.  Critical care was time spent personally by me (independent of midlevel providers or residents) on the following activities: development of treatment plan with patient and/or surrogate as well as nursing, discussions with consultants, evaluation of patient's response to treatment, examination of patient, obtaining history from patient or surrogate, ordering and performing treatments and interventions, ordering and review of laboratory studies, ordering and review of radiographic studies, pulse oximetry and re-evaluation of patient's  condition.    Length of Stay: 0  Jules Oar, MD  03/13/2024, 10:13 AM    Advanced Heart Failure Team Pager [phone] (M-F; 7a - 5p)  Please contact [facility] for night-coverage after hours (4p -7a ) and weekends on [email or web address]

## 2024-04-10 NOTE — Progress Notes (Signed)
 NAME:  Connor Solomon, MRN:  [id number], DOB:  [birth year], LOS: 0 ADMISSION DATE:  03/20/2024, CONSULTATION DATE:  03/14/2024 REFERRING MD: Kelsey Patricia, MD, CHIEF COMPLAINT: SOB for 12 hrs History of Present Illness:  A 63 yr old male patient with HTN, anxiety/depression, chronic pain, COPD (HOT 3 L [place]), Waldenstrom macroglobulinemia, and CHF (March 2025, Echo: EF <20%, G3DD), who presented to ED complaining of shortness of breath since am yesterday.  History was provided by EMS and ED staff.  EMS was called out to home for SOB. On their arrival he was found sitting on the commode, tripoding, hypotensive(SBP in the 70s with poor perfusion). He was placed on NRM due to poor waveform on SpO2 but initial sats were around 99%. He was treated with 500 cc of normal saline prior to ED arrival and another 1 L NS 0.9% in ED due to hypotension. He was in the hospital all day visiting his wife and left at 5.  No associated chest pain.  He did have upper abdominal pain that he thought it was hunger pain that was overall improving.  No fever.  He had intermittent lower extremity edema and this was worsening compared to baseline. Coded in ED for PEA for 10 min before ROSC. Intubated ETT 7.5, 22 cc @ teeth. Started on Vanco, Zithromax , Rocephin , Levophed and Epi, and right radial a-line was inserted by RT. He was hypoglycemic and given D50. His SpO2 was 95% on 3 L [place] before coded.  Pertinent  Medical History  HTN, anxiety/depression, chronic pain, COPD (HOT 3 L [place]), Waldenstrom macroglobulinemia, CHF (March 2025, Echo: EF <20%, G3DD)  Significant Hospital Events: Including procedures, antibiotic start and stop dates in addition to other pertinent events   03/15/2024: Code blue, intubated, admitted to ICU, by mid morning patient was seen in multisystem organ failure   Interim History / Subjective:  Unresponsive with fixed and dilated pupils  Maxed on Levo and Epi   Objective    Blood pressure 111/78, pulse 84,  temperature (!) 97.5 F (36.4 C), temperature source Oral, resp. rate (!) 26, height 5' 8 (1.727 m), weight 62 kg, SpO2 93%.    Vent Mode: PRVC FiO2 (%):  [100 %] 100 % Set Rate:  [20 bmp-30 bmp] 30 bmp Vt Set:  [550 mL] 550 mL PEEP:  [5 cmH20-8 cmH20] 8 cmH20 Plateau Pressure:  [17 cmH20] 17 cmH20   Intake/Output Summary (Last 24 hours) at 04/05/2024 0749 Last data filed at 04/06/2024 0700 Gross per 24 hour  Intake 2569.08 ml  Output 5 ml  Net 2564.08 ml   Filed Weights   03/13/2024 0004 03/28/2024 0500  Weight: 55 kg 62 kg    Examination: General: Acute on chronically ill appearing frail cachetic elderly male lying in bed on mechanical ventilation, in NAD HEENT: ETT, MM pink/moist, PERRL,  Neuro: Unresponsive with fixed and dilated pupils  CV: s1s2 regular rate and rhythm, no murmur, rubs, or gallops,  PULM:  Agonal breathing on vent, with tachypnea  GI: soft, bowel sounds active in all 4 quadrants, non-tender, non-distended Extremities: warm/dry, non pitting edema  Skin: no rashes or lesions  Resolved problem list   Assessment and Plan  PEA cardiac arrest due to septic shock, lactic acidosis, and hypoglycemia P: Continuous telemetry Currently maxed on both Levophed and epinephrine  drips Remains critically ill and multisystem organ failure Needs ongoing goals of care Add Dobutamine   Profound shock with multisystem organ dysfunctions  - Multifactorial including septic and cardiogenic.  Lactic acidosis  P: Vent support as below   Follow cultures  Pressors for MAP goal > 65 Antibiotics as above  Add third pressor  Follow ECHO  Follow urine output   Acute Hypoxic and Hypercapnic Respiratory Failure  Bilateral pleural effusions Community-acquired pneumonia History of COPD P: Continue ventilator support with lung protective strategies  Wean PEEP and FiO2 for sats greater than 90%. Head of bed elevated 30 degrees. Plateau pressures less than 30 cm H20.  Follow  intermittent chest x-ray and ABG.   SAT/SBT as tolerated, mentation preclude extubation  Ensure adequate pulmonary hygiene  Follow cultures  VAP bundle in place  PAD protocol Continue empiric azithromycin  and ceftriaxone , given ongoing shock could escalate antibiotics As needed bronchodilators  Continue stress dose steroids  HFrEF -ECHO March 2025 EF less than 20% with global hypokineses and grade III diastolic dysfunction  Essential HTN Elevated troponin  P: Consult HF  Continuous telemetry  Strict intake and output  Daily weight to assess volume status Support for cardiogenic shock as above   Elevated liver enzymes  -Shock liver in the setting of profound shock  P: Supportive care as above  Avoid hepatotoxins   Hypocalcemia  P: Supplement   AKI in the setting of profound shock  -Creatine 1.65 with GFR 46 on admission, renal function normal May 2025 Hyperkalemia  P: Follow renal function Monitor urine output Trend Bmet Avoid nephrotoxins Ensure adequate renal perfusion  [place]ma   Waldenstrom macroglobulinemia (Lymphoplasmacytic lymphoma) Acute thrombocytopenia and elevated LFT due to sepsis P: Appears that he has seen palliative care within the cancer center but I see no documentation on Code status   Goals of care  P: As the morning progress patient continues to progressively decline with high concern for impending cardiac arrest. Ongoing attempts to contact family    Best Practice (right click and Reselect all SmartList Selections daily)   Diet/type: NPO w/ oral meds DVT prophylaxis prophylactic heparin   Pressure ulcer(s): N/A GI prophylaxis: PPI Lines: Central line Foley:  Yes, and it is still needed Code Status:  full code Last date of multidisciplinary goals of care discussion: Ongoing attempts to contact family   Critical care time:  CRITICAL CARE Performed by: Sharay Bellissimo D. Harris   Total critical care time: 55 minutes  Critical care time  was exclusive of separately billable procedures and treating other patients.  Critical care was necessary to treat or prevent imminent or life-threatening deterioration.  Critical care was time spent personally by me on the following activities: development of treatment plan with patient and/or surrogate as well as nursing, discussions with consultants, evaluation of patient's response to treatment, examination of patient, obtaining history from patient or surrogate, ordering and performing treatments and interventions, ordering and review of laboratory studies, ordering and review of radiographic studies, pulse oximetry and re-evaluation of patient's condition.  Arra Connaughton D. Harris, NP-C [facility] Personal contact information can be found on Amion  If no contact or response made please call [phone] 04/09/2024, 8:57 AM

## 2024-04-10 NOTE — Progress Notes (Signed)
 PCCM Progress Note   Daughter arrived and updated at bedside, after discussion decision was made to change code status to DNR and to continue current interventions to allow time for other family to arrive. At this time we will continue ventilator, pressors, and bicarb drip. No further bicarb pushes or escalation in case.   Andromeda Poppen D. Harris, NP-C [facility] Personal contact information can be found on Amion  If no contact or response made please call [phone] 03/19/2024, 10:31 AM

## 2024-04-10 NOTE — Progress Notes (Signed)
 Pt was extubated to comfort care @ 13:50 with family and RN at the bedside.

## 2024-04-10 NOTE — Progress Notes (Signed)
 Patient's time of death was 1426. Death was pronounced by Arley Bending, RN and Cruzita Dopp, RN. The attending provider was notified as well. Patient's daughter remained at bedside and did not have any further questions. [facility]/Patient Placement notified

## 2024-04-10 NOTE — Progress Notes (Signed)
 Called to bedside for ongoing hypotension, acidosis. Has received 2 amps bicarb.  LA  >9 7.0/52/62/13  POCUS with dilated LV, minimal movement of LV. Very low EF.  Baseline low EF <20% Has been seeing Palliative care as an OP. They were notified today that he is admitted and critically ill. They reported he had been reluctant to change code status to DNR, but he did not want life support per their report. His wife is admitted at [facility] now; RN making attempts to contact her. She is not capable of making decisions for him apparently.  No other numbers in the chart.   Increased NE and Epi, added DBA 5mcg.  4 more amps of bicarb, increased infusion rate. Decreasing sedation as able Vent adjustments made due to refractory hypoxia-- limited by obstruction and already on 100% FiO2.  Discussed case with AHF. Not a candidate for advanced therapies due to severity of baseline heart disease, not an LVAD candidate.  Looking for family, but it seems like he will be unlikely to survive this insult. I would recommend DNR. Palliative care has recommended ethics and change of code status by the medical team based on his previous wishes.   Ethics team contacted. Their recommendations: keep trying to contact family, may have to make decisions on his behalf as the medical team if we are unable to find family.    NP Raquel Cables was able to contact daughter who is on the way to the hospital. She wants to continue aggressive care until he arrives.   This patient is critically ill with multiple organ system failure which requires frequent high complexity decision making, assessment, support, evaluation, and titration of therapies. This was completed through the application of advanced monitoring technologies and extensive interpretation of multiple databases. During this encounter critical care time was devoted to patient care services described in this note for 50 minutes.  Joesph Mussel, DO 04/08/2024 9:40  AM [facility]  For contact information, see Amion. If no response to pager, please call PCCM consult pager. After hours, 7PM- 7AM, please call Elink.

## 2024-04-10 NOTE — ED Provider Notes (Signed)
 [place] EMERGENCY DEPARTMENT AT [facility] Provider Note   CSN: [id number] Arrival date & time: 03/20/24  2358     History  Chief Complaint  Patient presents with   Shortness of Breath    Connor Solomon is a 63 y.o. male.  The history is provided by the patient, the EMS personnel and medical records.  Shortness of Breath Connor Solomon is a 63 y.o. male who presents to the Emergency Department complaining of shortness of breath.  Level 5 caveat due to altered mental status.  History is provided by EMS and the patient.  EMS was called out to home for shortness of breath.  On their arrival he was found sitting on the commode, tripoding, hypotensive with blood pressures in the 70s with poor perfusion.  He was placed on nonrebreather due to poor waveform on SpO2 but initial sats were around 99%.  He was treated with 500 cc of normal saline prior to ED arrival.  Patient reports that he started feeling short of breath earlier in the day, thinks he might have pneumonia.  He was in the hospital all day visiting his wife and left at 5.  No associated chest pain.  He states that he did have upper abdominal pain that he thinks is hunger pain that is overall improving.  No fever.  He also reports intermittent lower extremity edema and this is worsening compared to baseline.  Patient does request to be full code at this time.     Home Medications Prior to Admission medications   Medication Sig Start Date End Date Taking? Authorizing Provider  acetaminophen  (TYLENOL ) 500 MG tablet Take 500 mg by mouth every 6 (six) hours as needed.    [provider]  albuterol  (PROAIR  HFA) 108 (90 Base) MCG/ACT inhaler Inhale 2 puffs into the lungs every 6 (six) hours as needed for wheezing or shortness of breath. 05/30/23   Aurora Lees, DO  aspirin  EC 325 MG tablet Take 1 tablet (325 mg total) by mouth daily. 01/11/24   Amin, Ankit C, MD  carvedilol  (COREG ) 3.125 MG tablet Take 1 tablet (3.125 mg  total) by mouth 2 (two) times daily. NEEDS FOLLOW UP APPOINTMENT FOR MORE REFILLS 01/11/24   Bensimhon, Rheta Celestine, MD  chlorhexidine  (HIBICLENS ) 4 % external liquid Apply topically daily as needed. Dilute with water and clean wound with this.  Rinse thoroughly afterwards Patient not taking: Reported on 01/04/2024 05/24/23   Ethlyn Herd, MD  clobetasol  cream (TEMOVATE ) 0.05 % Apply 1 Application topically 2 (two) times daily. Apply topically to the arms and hands twice daily for up to two weeks 01/05/23   Dellar Fenton, DO  colchicine  0.6 MG tablet Take 1 tablet (0.6 mg total) by mouth 2 (two) times daily. 01/10/24   Amin, Ankit C, MD  dapagliflozin  propanediol (FARXIGA ) 10 MG TABS tablet Take 1 tablet (10 mg total) by mouth daily before breakfast. NEEDS FOLLOW UP APPOINTMENT FOR ANYMORE REFILLS 08/15/23   Bensimhon, Rheta Celestine, MD  diazepam  (VALIUM ) 5 MG tablet Take 1 tablet (5 mg total) by mouth every 12 (twelve) hours as needed for anxiety. 03/15/24   Pickenpack-Cousar, Athena N, NP  DULoxetine  (CYMBALTA ) 30 MG capsule Take 1 capsule (30 mg total) by mouth daily. 01/10/24   Amin, Ankit C, MD  eplerenone  (INSPRA ) 25 MG tablet Take 0.5 tablets (12.5 mg total) by mouth daily. NEEDS FOLLOW UP APPOINTMENT FOR MORE REFILLS 01/10/24   Maggie Schooner, MD  famotidine  (PEPCID )  20 MG tablet Take 1 tablet (20 mg total) by mouth at bedtime. 01/10/24 04/09/24  Amin, Ankit C, MD  ferrous sulfate  (FEROSUL) 325 (65 FE) MG tablet Take 1 tablet by mouth daily with breakfast. Please take with a source of Vitamin C 03/14/23   Dorsey, John T IV, MD  furosemide  (LASIX ) 40 MG tablet Take 1 tablet (40 mg total) by mouth daily as needed for fluid or edema. 01/10/24   Amin, Ankit C, MD  ibrutinib  (IMBRUVICA ) 420 MG tablet Take 1 tablet (420 mg total) by mouth daily. 02/15/24   Ander Bame, MD  methadone  (DOLOPHINE ) 10 MG tablet Take 1 tablet (10 mg total) by mouth every 8 (eight) hours. 03/02/24   Pickenpack-Cousar, Athena N, NP  naloxone   (NARCAN ) nasal spray 4 mg/0.1 mL Take for overdose 01/23/22   Rosealee Concha, MD  naloxone  (NARCAN ) nasal spray 4 mg/0.1 mL Take for overdose as directed 01/23/22   Rosealee Concha, MD  ondansetron  (ZOFRAN ) 8 MG tablet Take 1 tablet (8 mg) by mouth every 8 hours as needed. 06/06/23   Pickenpack-Cousar, Athena N, NP  Oxycodone  HCl 10 MG TABS Take 1 tablet (10 mg total) by mouth every 4 (four) hours as needed. 03/10/24   Pickenpack-Cousar, Athena N, NP  sacubitril -valsartan  (ENTRESTO ) 24-26 MG Take 1 tablet by mouth 2 (two) times daily. 01/10/24   Amin, Ankit C, MD  sildenafil  (VIAGRA ) 50 MG tablet Take 1 tablet (50 mg total) by mouth daily as needed for erectile dysfunction. 11/04/23   Ander Bame, MD      Allergies    Rituxan  Elisa.Eden ] and Hydrocodone     Review of Systems   Review of Systems  Respiratory:  Positive for shortness of breath.   All other systems reviewed and are negative.   Physical Exam Updated Vital Signs BP (!) 111/93   Pulse (!) 25   Temp (!) 97 F (36.1 C) (Rectal)   Resp 20   Ht 5' 8 (1.727 m)   Wt 55 kg   SpO2 (!) 63%   BMI 18.44 kg/m  Physical Exam Vitals and nursing note reviewed.  Constitutional:      General: He is in acute distress.     Appearance: He is well-developed. He is ill-appearing.  HENT:     Head: Normocephalic and atraumatic.  Cardiovascular:     Rate and Rhythm: Normal rate and regular rhythm.     Heart sounds: No murmur heard. Pulmonary:     Effort: Pulmonary effort is normal. No respiratory distress.     Breath sounds: Normal breath sounds.  Abdominal:     Palpations: Abdomen is soft.     Tenderness: There is no guarding or rebound.     Comments: Moderate epigastric tenderness  Musculoskeletal:        General: No tenderness.     Comments: Pitting edema to bilateral lower extremities, left greater than right.  There is mottling in bilateral distal lower extremities.  1+ femoral pulses bilaterally.  Skin:    General: Skin is  warm and dry.     Capillary Refill: Capillary refill takes more than 3 seconds.  Neurological:     Comments: Oriented to hospital.  Frequently falls asleep but he does awaken to verbal stimuli.  Global weakness.  Psychiatric:        Behavior: Behavior normal.    ED Results / Procedures / Treatments   Labs (all labs ordered are listed, but only abnormal results are displayed) Labs  Reviewed  COMPREHENSIVE METABOLIC PANEL WITH GFR - Abnormal; Notable for the following components:      Result Value   CO2 16 (*)    Glucose, Bld 134 (*)    Creatinine, Ser 1.65 (*)    Calcium 8.0 (*)    Total Protein 5.2 (*)    Albumin 2.8 (*)    AST 97 (*)    ALT 61 (*)    Total Bilirubin 1.9 (*)    GFR, Estimated 46 (*)    All other components within normal limits  CBC WITH DIFFERENTIAL/PLATELET - Abnormal; Notable for the following components:   Hemoglobin 12.2 (*)    RDW 19.1 (*)    Platelets 138 (*)    Abs Immature Granulocytes 0.11 (*)    All other components within normal limits  PROTIME-INR - Abnormal; Notable for the following components:   Prothrombin Time 16.8 (*)    INR 1.3 (*)    All other components within normal limits  BRAIN NATRIURETIC PEPTIDE - Abnormal; Notable for the following components:   B Natriuretic Peptide 2,024.6 (*)    All other components within normal limits  URINALYSIS, W/ REFLEX TO CULTURE (INFECTION SUSPECTED) - Abnormal; Notable for the following components:   APPearance CLOUDY (*)    Glucose, UA 50 (*)    Hgb urine dipstick SMALL (*)    Protein, ur 100 (*)    Bacteria, UA RARE (*)    Non Squamous Epithelial 0-5 (*)    All other components within normal limits  PHOSPHORUS - Abnormal; Notable for the following components:   Phosphorus 9.4 (*)    All other components within normal limits  COMPREHENSIVE METABOLIC PANEL WITH GFR - Abnormal; Notable for the following components:   Potassium 5.7 (*)    CO2 14 (*)    Glucose, Bld 235 (*)    Creatinine, Ser  1.67 (*)    Calcium 8.4 (*)    Total Protein 4.2 (*)    Albumin 2.1 (*)    AST 433 (*)    ALT 236 (*)    Total Bilirubin 1.9 (*)    GFR, Estimated 46 (*)    Anion gap 16 (*)    All other components within normal limits  LACTIC ACID, PLASMA - Abnormal; Notable for the following components:   Lactic Acid, Venous >9.0 (*)    All other components within normal limits  I-STAT CG4 LACTIC ACID, ED - Abnormal; Notable for the following components:   Lactic Acid, Venous 6.1 (*)    All other components within normal limits  I-STAT CHEM 8, ED - Abnormal; Notable for the following components:   Creatinine, Ser 1.50 (*)    Calcium, Ion 1.06 (*)    TCO2 16 (*)    All other components within normal limits  I-STAT VENOUS BLOOD GAS, ED - Abnormal; Notable for the following components:   pH, Ven 7.163 (*)    pCO2, Ven 42.2 (*)    pO2, Ven 47 (*)    Bicarbonate 15.2 (*)    TCO2 16 (*)    Acid-base deficit 13.0 (*)    Calcium, Ion 1.05 (*)    All other components within normal limits  CBG MONITORING, ED - Abnormal; Notable for the following components:   Glucose-Capillary 33 (*)    All other components within normal limits  CBG MONITORING, ED - Abnormal; Notable for the following components:   Glucose-Capillary 27 (*)    All other components within normal limits  CBG MONITORING, ED - Abnormal; Notable for the following components:   Glucose-Capillary 116 (*)    All other components within normal limits  CBG MONITORING, ED - Abnormal; Notable for the following components:   Glucose-Capillary 113 (*)    All other components within normal limits  I-STAT ARTERIAL BLOOD GAS, ED - Abnormal; Notable for the following components:   pH, Arterial 6.990 (*)    pCO2 arterial 51.6 (*)    Bicarbonate 12.6 (*)    TCO2 14 (*)    Acid-base deficit 19.0 (*)    Sodium 134 (*)    Potassium 5.8 (*)    HCT 32.0 (*)    Hemoglobin 10.9 (*)    All other components within normal limits  TROPONIN I (HIGH  SENSITIVITY) - Abnormal; Notable for the following components:   Troponin I (High Sensitivity) 119 (*)    All other components within normal limits  CULTURE, BLOOD (ROUTINE X 2)  CULTURE, BLOOD (ROUTINE X 2)  MRSA NEXT GEN BY PCR, NASAL  AMMONIA  LIPASE, BLOOD  MAGNESIUM   BLOOD GAS, ARTERIAL  CBC  PROTIME-INR  CALCIUM, IONIZED  CBG MONITORING, ED  I-STAT CG4 LACTIC ACID, ED    EKG EKG Interpretation Date/Time:  Wednesday March 21 2024 00:08:14 EDT Ventricular Rate:  86 PR Interval:  157 QRS Duration:  103 QT Interval:  404 QTC Calculation: 484 R Axis:   106  Text Interpretation: Sinus rhythm Right axis deviation Low voltage, extremity leads Consider anterior infarct Nonspecific T abnormalities, lateral leads Baseline wander Confirmed by Kelsey Patricia [phone]) on 03/13/2024 12:27:39 AM  Radiology DG Chest Portable 1 View Result Date: 03/15/2024 CLINICAL DATA:  Check central line placement EXAM: PORTABLE CHEST 1 VIEW COMPARISON:  Film from earlier in the same day. FINDINGS: The tracheal tube is again seen. Left jugular central line is noted in the mid to distal SVC. No pneumothorax is seen. Persistent vascular congestion and edema is noted. IMPRESSION: No pneumothorax following central line placement. The remainder of the exam is stable. Electronically Signed   By: Violeta Grey M.D.   On: 04/04/2024 03:37   DG Chest Port 1 View Result Date: 04/05/2024 CLINICAL DATA:  Status post intubation EXAM: PORTABLE CHEST 1 VIEW COMPARISON:  Film from earlier in the same day. FINDINGS: Cardiac shadow is enlarged but stable. Endotracheal tube is noted in satisfactory position. Increasing airspace opacities are seen consistent with worsening edema. No effusion is seen. No pneumothorax is noted. Bony structures are within normal limits. IMPRESSION: Worsening vascular congestion and pulmonary edema. Endotracheal tube in satisfactory position. Electronically Signed   By: Violeta Grey M.D.   On:  04/07/2024 02:49   CT Angio Chest PE W/Cm &/Or Wo Cm Result Date: 03/11/2024 CLINICAL DATA:  Shortness of breath. Abdominal pain, acute, nonlocalized. EXAM: CT ANGIOGRAPHY CHEST CT ABDOMEN AND PELVIS WITH CONTRAST TECHNIQUE: Multidetector CT imaging of the chest was performed using the standard protocol during bolus administration of intravenous contrast. Multiplanar CT image reconstructions and MIPs were obtained to evaluate the vascular anatomy. Multidetector CT imaging of the abdomen and pelvis was performed using the standard protocol during bolus administration of intravenous contrast. RADIATION DOSE REDUCTION: This exam was performed according to the departmental dose-optimization program which includes automated exposure control, adjustment of the mA and/or kV according to patient size and/or use of iterative reconstruction technique. CONTRAST:  75mL OMNIPAQUE  IOHEXOL  350 MG/ML SOLN COMPARISON:  Portable chest today, portable chest 01/07/2024, CTA chest 01/04/2024, chest CT no contrast 05/12/2021, CT  abdomen pelvis with contrast 04/07/2021 and 05/31/2020. FINDINGS: CTA CHEST FINDINGS Cardiovascular: Arterial opacification is diagnostic. No arterial dilatation or embolism is seen. There is moderate cardiomegaly with a left chamber predominance. There is IVC and hepatic vein contrast reflux which could be due to right heart dysfunction or tricuspid regurgitation. Minimal pericardial effusion is noted but with improvement. Slight dilatation is seen in the ascending aorta, mid segment is 4.0 cm on 6:101. The aorta is tortuous with patchy calcification and otherwise normal caliber. There are scattered calcifications in the great vessels. Aortic opacification is insufficient to assess the lumen. There is prominence in the superior pulmonary veins. Scattered 2 vessel calcification in the LAD and right coronary arteries. There is scattered intravenous air, right chest wall right subclavian vein. This was probably  injected with the contrast or at the time of IV insertion. Mediastinum/Nodes: Interval increase in slightly prominent bilateral hilar nodes largest is 1 cm in short axis. Increased prominence of subcarinal nodes largest is 1.5 cm on 3:84. Thyroid gland is obscured by spray artifact from intravenous contrast but was previously unremarkable. Negative thoracic esophagus, thoracic trachea, both main bronchi. Axillary spaces are clear. Lungs/Pleura: There are mild paraseptal emphysematous changes in the lung apices. There is a small right and minimal to trace left layering pleural effusions both improved since 01/04/2024. No pneumothorax. There is diffuse bronchial thickening. Patchy airspace disease in the anterior segment of the right upper lobe is new and consistent with consolidated pneumonia, with interspersed ground-glass pneumonitis in the area. There is additional perihilar airspace disease in the posteromedial aspect of the left upper lobe. There is diffuse bronchial thickening. There is coarse reticulation in left lower lobe base, the area previously obscured by pleural effusion, not seen in 2022 and could indicate asymmetric mild interstitial edema or interstitial lung disease. There was previously an 8 mm right upper lobe ground-glass nodule which has resolved. There was a subsolid 10 mm left upper lobe nodule which is also resolved. There is mild posterior atelectasis both lungs. No other active process is seen. Musculoskeletal: There is a chronic healed fracture of right humeral neck. Mild degenerative change dorsal spine. No acute or significant osseous abnormality is seen. There is a progressive paucity of body fat which may indicate cachexia. Review of the MIP images confirms the above findings. CT ABDOMEN and PELVIS FINDINGS Hepatobiliary: No focal liver abnormality is seen. No gallstones, gallbladder wall thickening, or biliary dilatation. Pancreas: No mass enhancement or ductal dilatation. Spleen: No  mass.  No splenomegaly. Adrenals/Urinary Tract: Scattered cortical scarring left greater than right kidneys. No adrenal mass. There is no renal mass enhancement. There are striated nephrograms on the delayed phase images, which could be bolus phase related or due to infection such as pyelonephritis. There is no evidence of urinary stones or obstruction. There is no bladder thickening. Stomach/Bowel: The stomach is contracted. The small bowel is normal in caliber. The appendix is normal. There is a 5 cm segment in the distal sigmoid colon demonstrating either circumferential wall thickening or underdistention, on series 5 axial 61-65. Prepped barium enema or colonoscopy is recommended to further evaluate this. Vascular/Lymphatic: Aortic atherosclerosis. No enlarged abdominal or pelvic lymph nodes. Reproductive: The prostate is normal in size and both testicles are in the scrotal sac. Other: There is mild free ascites throughout the abdomen and pelvis but no large drainable pocket. There is diffuse mesenteric haziness. There is generalized body wall edema new from 2021. There is no free air. No incarcerated hernia. Musculoskeletal:  Degenerative change with disc space loss and spondylosis L5-S1. No acute or other significant osseous process. Review of the MIP images confirms the above findings. IMPRESSION: 1. No evidence of pulmonary arterial dilatation or embolus. 2. Cardiomegaly with prominence of the superior pulmonary veins. 3. IVC and hepatic vein contrast reflux which could be due to right heart dysfunction or tricuspid regurgitation. 4. Small right and minimal to trace left pleural effusions improved since 01/04/2024. 5. Patchy airspace disease in the anterior segment of the right upper lobe and posteromedial left upper lobe consistent with consolidated pneumonia. Short interval follow-up CT recommended to ensure clearing. 6. Coarse reticulation in the left lower lobe base, not seen in 2022 and could indicate  asymmetric mild interstitial edema or interstitial lung disease. 7. COPD. 8. Interval increase in slightly prominent hilar and subcarinal nodes. 9. Progressive paucity of body fat which may indicate cachexia. Generalized body wall edema new from 2021. 10. Mild free ascites throughout the abdomen and pelvis with diffuse mesenteric haziness, the latter which could be due to congestion, hepatic dysfunction or malnutrition. 11. 5 cm segment of distal sigmoid colon demonstrating either circumferential wall thickening or underdistention. Prepped barium enema or colonoscopy is recommended to further evaluate this. 12. Striated nephrograms on the delayed phase images, which could be bolus phase related or due to infection such as pyelonephritis. No hydronephrosis. 13. Aortic and coronary artery atherosclerosis with 4 cm ascending aortic measurement. Recommend annual imaging followup by CTA or MRA. This recommendation follows 2010 ACCF/AHA/AATS/ACR/ASA/SCA/SCAI/SIR/STS/SVM Guidelines for the Diagnosis and Management of Patients with Thoracic Aortic Disease. Circulation. 2010; 121: Z610-R604. Aortic aneurysm NOS (ICD10-I71.9). Aortic Atherosclerosis (ICD10-I70.0) and Emphysema (ICD10-J43.9). Electronically Signed   By: Denman Fischer M.D.   On: 03/26/2024 02:12   CT ABDOMEN PELVIS W CONTRAST Result Date: 03/17/2024 CLINICAL DATA:  Shortness of breath. Abdominal pain, acute, nonlocalized. EXAM: CT ANGIOGRAPHY CHEST CT ABDOMEN AND PELVIS WITH CONTRAST TECHNIQUE: Multidetector CT imaging of the chest was performed using the standard protocol during bolus administration of intravenous contrast. Multiplanar CT image reconstructions and MIPs were obtained to evaluate the vascular anatomy. Multidetector CT imaging of the abdomen and pelvis was performed using the standard protocol during bolus administration of intravenous contrast. RADIATION DOSE REDUCTION: This exam was performed according to the departmental dose-optimization  program which includes automated exposure control, adjustment of the mA and/or kV according to patient size and/or use of iterative reconstruction technique. CONTRAST:  75mL OMNIPAQUE  IOHEXOL  350 MG/ML SOLN COMPARISON:  Portable chest today, portable chest 01/07/2024, CTA chest 01/04/2024, chest CT no contrast 05/12/2021, CT abdomen pelvis with contrast 04/07/2021 and 05/31/2020. FINDINGS: CTA CHEST FINDINGS Cardiovascular: Arterial opacification is diagnostic. No arterial dilatation or embolism is seen. There is moderate cardiomegaly with a left chamber predominance. There is IVC and hepatic vein contrast reflux which could be due to right heart dysfunction or tricuspid regurgitation. Minimal pericardial effusion is noted but with improvement. Slight dilatation is seen in the ascending aorta, mid segment is 4.0 cm on 6:101. The aorta is tortuous with patchy calcification and otherwise normal caliber. There are scattered calcifications in the great vessels. Aortic opacification is insufficient to assess the lumen. There is prominence in the superior pulmonary veins. Scattered 2 vessel calcification in the LAD and right coronary arteries. There is scattered intravenous air, right chest wall right subclavian vein. This was probably injected with the contrast or at the time of IV insertion. Mediastinum/Nodes: Interval increase in slightly prominent bilateral hilar nodes largest is 1 cm in short  axis. Increased prominence of subcarinal nodes largest is 1.5 cm on 3:84. Thyroid gland is obscured by spray artifact from intravenous contrast but was previously unremarkable. Negative thoracic esophagus, thoracic trachea, both main bronchi. Axillary spaces are clear. Lungs/Pleura: There are mild paraseptal emphysematous changes in the lung apices. There is a small right and minimal to trace left layering pleural effusions both improved since 01/04/2024. No pneumothorax. There is diffuse bronchial thickening. Patchy airspace  disease in the anterior segment of the right upper lobe is new and consistent with consolidated pneumonia, with interspersed ground-glass pneumonitis in the area. There is additional perihilar airspace disease in the posteromedial aspect of the left upper lobe. There is diffuse bronchial thickening. There is coarse reticulation in left lower lobe base, the area previously obscured by pleural effusion, not seen in 2022 and could indicate asymmetric mild interstitial edema or interstitial lung disease. There was previously an 8 mm right upper lobe ground-glass nodule which has resolved. There was a subsolid 10 mm left upper lobe nodule which is also resolved. There is mild posterior atelectasis both lungs. No other active process is seen. Musculoskeletal: There is a chronic healed fracture of right humeral neck. Mild degenerative change dorsal spine. No acute or significant osseous abnormality is seen. There is a progressive paucity of body fat which may indicate cachexia. Review of the MIP images confirms the above findings. CT ABDOMEN and PELVIS FINDINGS Hepatobiliary: No focal liver abnormality is seen. No gallstones, gallbladder wall thickening, or biliary dilatation. Pancreas: No mass enhancement or ductal dilatation. Spleen: No mass.  No splenomegaly. Adrenals/Urinary Tract: Scattered cortical scarring left greater than right kidneys. No adrenal mass. There is no renal mass enhancement. There are striated nephrograms on the delayed phase images, which could be bolus phase related or due to infection such as pyelonephritis. There is no evidence of urinary stones or obstruction. There is no bladder thickening. Stomach/Bowel: The stomach is contracted. The small bowel is normal in caliber. The appendix is normal. There is a 5 cm segment in the distal sigmoid colon demonstrating either circumferential wall thickening or underdistention, on series 5 axial 61-65. Prepped barium enema or colonoscopy is recommended to  further evaluate this. Vascular/Lymphatic: Aortic atherosclerosis. No enlarged abdominal or pelvic lymph nodes. Reproductive: The prostate is normal in size and both testicles are in the scrotal sac. Other: There is mild free ascites throughout the abdomen and pelvis but no large drainable pocket. There is diffuse mesenteric haziness. There is generalized body wall edema new from 2021. There is no free air. No incarcerated hernia. Musculoskeletal: Degenerative change with disc space loss and spondylosis L5-S1. No acute or other significant osseous process. Review of the MIP images confirms the above findings. IMPRESSION: 1. No evidence of pulmonary arterial dilatation or embolus. 2. Cardiomegaly with prominence of the superior pulmonary veins. 3. IVC and hepatic vein contrast reflux which could be due to right heart dysfunction or tricuspid regurgitation. 4. Small right and minimal to trace left pleural effusions improved since 01/04/2024. 5. Patchy airspace disease in the anterior segment of the right upper lobe and posteromedial left upper lobe consistent with consolidated pneumonia. Short interval follow-up CT recommended to ensure clearing. 6. Coarse reticulation in the left lower lobe base, not seen in 2022 and could indicate asymmetric mild interstitial edema or interstitial lung disease. 7. COPD. 8. Interval increase in slightly prominent hilar and subcarinal nodes. 9. Progressive paucity of body fat which may indicate cachexia. Generalized body wall edema new from 2021. 10. Mild  free ascites throughout the abdomen and pelvis with diffuse mesenteric haziness, the latter which could be due to congestion, hepatic dysfunction or malnutrition. 11. 5 cm segment of distal sigmoid colon demonstrating either circumferential wall thickening or underdistention. Prepped barium enema or colonoscopy is recommended to further evaluate this. 12. Striated nephrograms on the delayed phase images, which could be bolus phase  related or due to infection such as pyelonephritis. No hydronephrosis. 13. Aortic and coronary artery atherosclerosis with 4 cm ascending aortic measurement. Recommend annual imaging followup by CTA or MRA. This recommendation follows 2010 ACCF/AHA/AATS/ACR/ASA/SCA/SCAI/SIR/STS/SVM Guidelines for the Diagnosis and Management of Patients with Thoracic Aortic Disease. Circulation. 2010; 121: Z610-R604. Aortic aneurysm NOS (ICD10-I71.9). Aortic Atherosclerosis (ICD10-I70.0) and Emphysema (ICD10-J43.9). Electronically Signed   By: Denman Fischer M.D.   On: 04/06/2024 02:12   CT Head Wo Contrast Result Date: 04/06/2024 CLINICAL DATA:  Mental status changes unknown cause. Shortness of breath. EXAM: CT HEAD WITHOUT CONTRAST TECHNIQUE: Contiguous axial images were obtained from the base of the skull through the vertex without intravenous contrast. RADIATION DOSE REDUCTION: This exam was performed according to the departmental dose-optimization program which includes automated exposure control, adjustment of the mA and/or kV according to patient size and/or use of iterative reconstruction technique. COMPARISON:  Head CT 01/07/2024 FINDINGS: Brain: There is mild global atrophy, slight atrophic ventriculomegaly and mild small vessel disease in the cerebral white matter. 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 No cortical based acute infarct, hemorrhage, mass or mass effect are seen. There is no midline shift.  The basal cisterns are clear. Vascular: There is trace pneumocephalus in anterior right temporal fossa. Scattered traces of air in the cavernous sinuses, scattered venous air both masticator spaces as well as anterior to the maxillary sinuses. Source of the air is indeterminate as there is no skull base fracture. There are calcific plaques in both carotid siphons but no hyperdense central vessel is seen. Skull: No evidence of fractures or focal lesions.  Sinuses/Orbits: There has increased prominence a left nasal retention cyst measuring 1.4 cm, previously 1 cm. There is increased membrane disease in the left greater than right maxillary sinuses, increased opacification in the left greater than right ethmoid air cells. The frontal and sphenoid sinus and mastoid air spaces are clear. Nasal septum deviates to the right. Other: None. IMPRESSION: 1. Trace pneumocephalus in the anterior right temporal fossa, scattered traces of air in the cavernous sinuses, scattered venous air in both masticator spaces as well as anterior to the maxillary sinuses. Source of the air is indeterminate as there is no skull base fracture. 2. No other significant intracranial CT findings. Atrophy and small-vessel disease. 3. Increased paranasal sinus membrane disease. Electronically Signed   By: Denman Fischer M.D.   On: 03/31/2024 01:27   DG Chest Port 1 View Result Date: 03/13/2024 CLINICAL DATA:  Shortness of breath EXAM: PORTABLE CHEST 1 VIEW COMPARISON:  01/07/2024 FINDINGS: Cardiomegaly, vascular congestion. No confluent opacities or effusions. No overt edema. No acute bony abnormality. IMPRESSION: Cardiomegaly, vascular congestion Electronically Signed   By: Janeece Mechanic M.D.   On: 03/15/2024 00:39    Procedures Date/Time: 03/12/2024 2:29 AM  Performed by: Kelsey Patricia, MDPre-anesthesia Checklist: Patient identified Oxygen Delivery Method: Ambu bag Induction Type: Rapid sequence Ventilation: Mask ventilation without difficulty Laryngoscope Size: Glidescope Tube size: 7.5 mm Number of attempts: 1 Placement Confirmation: ETT inserted through vocal cords under direct vision, Positive ETCO2 and Breath sounds checked-  equal and bilateral Secured at: 25 cm Tube secured with: ETT holder Dental Injury: Teeth and Oropharynx as per pre-operative assessment     .Critical Care  Performed by: Kelsey Patricia, MD Authorized by: Kelsey Patricia, MD   Critical care  provider statement:    Critical care time (minutes):  60   Critical care was necessary to treat or prevent imminent or life-threatening deterioration of the following conditions:  Cardiac failure and respiratory failure   Care discussed with: admitting provider       Medications Ordered in ED Medications  norepinephrine (LEVOPHED) 4mg  in (0.016 mg/mL) premix infusion (40 mcg/min Intravenous New Bag/Given 04/06/2024 0359)  fentaNYL  in NS (39mcg/ml) infusion-PREMIX (75 mcg/hr Intravenous Rate/Dose Change 04/04/2024 0303)  EPINEPHrine  (ADRENALIN ) 5 mg in NS 250 mL (0.02 mg/mL) premix infusion (21 mcg/min Intravenous Rate/Dose Change 03/25/2024 0255)  0.9 %  sodium chloride  infusion (has no administration in time range)  polyethylene glycol (MIRALAX  / GLYCOLAX ) packet 17 g (has no administration in time range)  heparin  injection 5,000 Units (has no administration in time range)  pantoprazole  (PROTONIX ) injection 40 mg (has no administration in time range)  insulin aspart (novoLOG) injection 0-9 Units (has no administration in time range)  docusate (COLACE) 50 MG/5ML liquid 100 mg (has no administration in time range)  cefTRIAXone  (ROCEPHIN ) 1 g in sodium chloride  0.9 % 100 mL IVPB (has no administration in time range)  azithromycin  (ZITHROMAX ) 500 mg in sodium chloride  0.9 % 250 mL IVPB (has no administration in time range)  ipratropium-albuterol  (DUONEB) 0.5-2.5 (3) MG/3ML nebulizer solution 3 mL (has no administration in time range)  methylPREDNISolone  sodium succinate (SOLU-MEDROL ) 125 mg/2 mL injection 125 mg (has no administration in time range)  sodium bicarbonate injection 100 mEq (has no administration in time range)  sodium bicarbonate 150 mEq in dextrose  5 % 1,150 mL infusion (has no administration in time range)  cefTRIAXone  (ROCEPHIN ) 1 g in sodium chloride  0.9 % 100 mL IVPB (0 g Intravenous Stopped 03/29/2024 0106)  azithromycin  (ZITHROMAX ) 500 mg in sodium chloride  0.9 %  250 mL IVPB (0 mg Intravenous Stopped 03/27/2024 0139)  vancomycin (VANCOREADY) IVPB 1250 mg/250 mL (0 mg Intravenous Stopped 04/05/2024 0217)  dextrose  50 % solution 50 mL (50 mLs Intravenous Given 04/05/2024 0011)  iohexol  (OMNIPAQUE ) 350 MG/ML injection 75 mL (75 mLs Intravenous Contrast Given 03/17/2024 0113)  sodium chloride  0.9 % bolus 500 mL (0 mLs Intravenous Stopped 03/25/2024 0208)  sodium chloride  0.9 % bolus 1,000 mL (0 mLs Intravenous Stopped 03/30/2024 0208)  EPINEPHrine  (ADRENALIN ) 1 MG/10ML injection (1 mg Intravenous Given 04/05/2024 0215)  sodium bicarbonate injection (50 mEq Intravenous Given 03/12/2024 0224)  EPINEPHrine  (ADRENALIN ) 1 MG/10ML injection (1 mg Intravenous Given 03/23/2024 0218)  etomidate (AMIDATE) injection (10 mg Intravenous Given 03/23/2024 0219)  succinylcholine (ANECTINE) injection (100 mg Intravenous Given 04/04/2024 0219)  dextrose  50 % solution (1 ampule Intravenous Given 04/06/2024 0221)  calcium chloride injection (1 g Intravenous Given 03/25/2024 0222)    ED Course/ Medical Decision Making/ A&P                                 Medical Decision Making Amount and/or Complexity of Data Reviewed Labs: ordered. Radiology: ordered.  Risk Prescription drug management. Decision regarding hospitalization.   Patient presented to the emergency department by EMS for evaluation of shortness of breath.  Patient lethargic but oriented at time of ED arrival.  Respiratory  effort is appropriate with good air movement bilaterally.  EKG with LVH changes, similar when compared to priors.  He does have lower extremity edema and has poor perfusion distally.  Blood pressures are normal in the emergency department.  He did receive 500 cc fluid bolus prior to ED arrival.  Of note he is significantly hypoglycemic and he was treated with dextrose .  His blood sugar did improve after this intervention.  His mental status also did improve somewhat.  He was started with antibiotics for possible pneumonia given  his difficulty breathing.  Bedside echocardiogram with diminished EF, no pericardial effusion.  Labs returned with mild AKI, mixed acidosis.  Given his symptoms CT head, CTA PE study as well as a CT abdomen pelvis due to his abdominal pain were obtained.  Imaging is negative for acute CVA, PE.  He does have pneumonia on his imaging.  After imaging he did have downtrending blood pressures and he received an additional 500 cc bolus, which resulted in increased work of breathing.  He was started on Levophed and then became rapidly unresponsive.  He did receive CPR for PEA arrest.  He did still have muscle tone and agonal respirations requiring RSI to perform intubation.  Intubation performed per note.  He did have ROSC.  His Levophed was continued and epinephrine  drip was initiated.  Discussed with intensivist, plan to admit for ongoing care.        Final Clinical Impression(s) / ED Diagnoses Final diagnoses:  Cardiac arrest [facility])  [facility])    Rx / DC Orders ED Discharge Orders     None         Kelsey Patricia, MD 03/17/2024 [phone]

## 2024-04-10 NOTE — Progress Notes (Addendum)
 eLink Physician-Brief Progress Note Patient Name: Connor Solomon DOB: [birth year] MRN: [id number]   Date of Service  03/30/2024  HPI/Events of Note  ABG reviewed.  eICU Interventions  Respiratory rate increased to 30, PEEP increased to 8, ABG in one hour. PRN bolus Fentanyl  orders placed, Lokelma 5 gm via OG tube x 1.        [name] 03/27/2024, 5:38 AM

## 2024-04-10 NOTE — Procedures (Signed)
 RT Art Line Insertion  Date/Time: 03/26/2024 3:07 AM  Performed by: Felton Hough, RRT Authorized by: Kelsey Patricia, MD  Consent: The procedure was performed in an emergent situation. Site marked: the operative site was marked Patient identity confirmed: arm band, hospital-assigned identification number and anonymous protocol, patient vented/unresponsive Time out: Immediately prior to procedure a time out was called to verify the correct patient, procedure, equipment, support staff and site/side marked as required. Preparation: Patient was prepped and draped in the usual sterile fashion. Local anesthesia used: no  Anesthesia: Local anesthesia used: no Patient tolerance: patient tolerated the procedure well with no immediate complications Comments: Arterial line inserted into pt. Right radial artery per MD and per protocol. No apparent complications.

## 2024-04-10 NOTE — Procedures (Signed)
 Central Venous Catheter Insertion Procedure Note  Connor Solomon  [id number]  06-25-1961  Date:03/24/2024  Time:4:08 AM   Provider Performing:Katharina Jehle Melven Stable Marene Shape   Procedure: Insertion of Non-tunneled Central Venous Catheter(36556) with US  guidance (40981)   Indication(s) Medication administration  Consent Unable to obtain consent due to emergent nature of procedure.  Anesthesia none  Timeout Verified patient identification, verified procedure, site/side was marked, verified correct patient position, special equipment/implants available, medications/allergies/relevant history reviewed, required imaging and test results available.  Sterile Technique Maximal sterile technique including full sterile barrier drape, hand hygiene, sterile gown, sterile gloves, mask, hair covering, sterile ultrasound probe cover (if used).  Procedure Description Area of catheter insertion was cleaned with chlorhexidine  and draped in sterile fashion.  With real-time ultrasound guidance a central venous catheter was placed into the left internal jugular vein. Nonpulsatile blood flow and easy flushing noted in all ports.  The catheter was sutured in place and sterile dressing applied.  Complications/Tolerance None; patient tolerated the procedure well. Chest X-ray is ordered to verify placement for internal jugular or subclavian cannulation.   Chest x-ray is not ordered for femoral cannulation.  EBL Minimal  Specimen(s) None

## 2024-04-10 NOTE — Progress Notes (Signed)
 PCCM Progress Note  Clinical team was able to locate and update patient's spouse Connie Azbell, unfortunately she is currently hospitalized at the [facility] and currently does not have capacity or mentation to have a goals of care discussion.  She was updated on patient's critical illness.   Staff at [facility] was able to give me contact information for patient's son and daughter both were called and voicemail was left.  Ashley's (patients daughter) called back and requested we continue to provide all aggressive interventions and for patient to remain full code until she can arrive at bedside, she is estimated about 20 minutes away.   Rayvn Rickerson D. Harris, NP-C [facility] Personal contact information can be found on Amion  If no contact or response made please call [phone] 03/11/2024, 9:39 AM

## 2024-04-10 NOTE — Progress Notes (Signed)
     Discussed patient's case with Laurina Popper, NP. Chart reviewed and updates received from RN. Patient has actively been followed by myself at [facility] for ongoing support and symptom management.   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 He has been realistic in his wishes expressing desires not to suffer and open to end-of-life but very protective of his wife who suffers from significant challenges of mental health. Cohan has mentioned when his time comes she will have difficulty accepting and unable to make decisions due to her chronic illnesses and mental disabilities. Patient and wife arranged for counseling in the past to allow for support and comfort when discussing life's decisions and stressors.   Given patient's current state and high risk of sudden death and decompensation recommendations would be to include ethics and possibly consider 2 provider decisions on behalf of Mr. Szabo in the event no available family is present to speak on his behalf and concerns for sudden health decline with minimal ability to demonstrate a meaningful recovery.   Inpatient palliative team available for consultation to assist with goals of care and complex decision making as needed.   Dellia Ferguson, AGPCNP-BC Palliative Medicine Team  Phone: [phone] Pager: [phone] Amion: Cori Dials

## 2024-04-10 NOTE — H&P (Signed)
 NAME:  Connor Solomon, MRN:  [id number], DOB:  December 25, 1960, LOS: 0 ADMISSION DATE:  03/20/2024, CONSULTATION DATE:  03/15/2024 REFERRING MD: Kelsey Patricia, MD, CHIEF COMPLAINT: SOB for 12 hrs History of Present Illness:  A 63 yr old male patient with HTN, anxiety/depression, chronic pain, COPD (HOT 3 L [place]), Waldenstrom macroglobulinemia, and CHF (March 2025, Echo: EF <20%, G3DD), who presented to ED complaining of shortness of breath since am yesterday.  History was provided by EMS and ED staff.  EMS was called out to home for SOB. On their arrival he was found sitting on the commode, tripoding, hypotensive(SBP in the 70s with poor perfusion). He was placed on NRM due to poor waveform on SpO2 but initial sats were around 99%. He was treated with 500 cc of normal saline prior to ED arrival and another 1 L NS 0.9% in ED due to hypotension. He was in the hospital all day visiting his wife and left at 5.  No associated chest pain.  He did have upper abdominal pain that he thought it was hunger pain that was overall improving.  No fever.  He had intermittent lower extremity edema and this was worsening compared to baseline. Coded in ED for PEA for 10 min before ROSC. Intubated ETT 7.5, 22 cc @ teeth. Started on Vanco, Zithromax , Rocephin , Levophed and Epi, and right radial a-line was inserted by RT. He was hypoglycemic and given D50. His SpO2 was 95% on 3 L [place] before coded. Pertinent  Medical History  HTN, anxiety/depression, chronic pain, COPD (HOT 3 L [place]), Waldenstrom macroglobulinemia, CHF (March 2025, Echo: EF <20%, G3DD) Significant Hospital Events: Including procedures, antibiotic start and stop dates in addition to other pertinent events   03/26/2024: Code blue, intubated, admitted to ICU  Interim History / Subjective:    Objective    Blood pressure 109/82, pulse 85, temperature (!) 97 F (36.1 C), temperature source Rectal, resp. rate (!) 22, height 5' 8 (1.727 m), weight 55 kg, SpO2 (!) 67%.     Vent Mode: PRVC FiO2 (%):  [100 %] 100 % Set Rate:  [20 bmp] 20 bmp Vt Set:  [550 mL] 550 mL PEEP:  [5 cmH20] 5 cmH20 Plateau Pressure:  [17 cmH20] 17 cmH20   Intake/Output Summary (Last 24 hours) at 03/15/2024 0340 Last data filed at 03/24/2024 0217 Gross per 24 hour  Intake 1339.01 ml  Output --  Net 1339.01 ml   Filed Weights   03/17/2024 0004  Weight: 55 kg    Examination: General: on sedated. Pupils are fixed dilated left>right  HENT: No LNE or thyromegaly. No JVD Lungs: symmetrical air entry bilaterally. Diffuse crackles. No wheezing Cardiovascular: NL S1/S2. No m/g/r Abdomen: no distension or tenderness Extremities: trace edema. Symmetrical  Neuro: sedated  Resolved problem list   Assessment and Plan  PEA cardiac arrest due to septic shock, lactic acidosis, and hypoglycemia -Admit to ICU  -Avoid fever -Echo -Serial Trop -Glycemic control  -Vasopressors for MAP >65 mmHg  PNA -Vancox1 -MRSA screening -Zithromax  and Rocephin  -Duoneb -Cx  Acute/chronic hypoxic resp failure -f/u ABG -VAP and DAP protocol -Steroids  HTN -Hold BP meds  CHF (March 2025, Echo: EF <20%, G3DD) and bilateral pleural effusions -Echo -Serial Trop  Acute thrombocytopenia and elevated LFT due to sepsis -Serial labs  Elevated Trop: type 2 -Echo -Serial Trop  Lactic acidosis -Serial LA -I/O chart  AKI -Monitor  -Avoid nephrotoxins -I/O chart  Anxiety/depression  Chronic pain  COPD (HOT 3 L [place])  Waldenstrom macroglobulinemia   Best Practice (right click and Reselect all SmartList Selections daily)   Diet/type: NPO w/ oral meds DVT prophylaxis prophylactic heparin   Pressure ulcer(s): N/A GI prophylaxis: PPI Lines: Central line Foley:  Yes, and it is still needed Code Status:  full code Last date of multidisciplinary goals of care discussion []   Labs   CBC: Recent Labs  Lab 03/16/2024 0010 04/03/2024 0036 04/01/2024 0332  WBC 7.9  --   --   NEUTROABS  5.8  --   --   HGB 12.2* 13.6  13.3 10.9*  HCT 39.3 40.0  39.0 32.0*  MCV 88.5  --   --   PLT 138*  --   --     Basic Metabolic Panel: Recent Labs  Lab 04/01/2024 0010 03/27/2024 0036 04/07/2024 0332  NA 136 137  137 134*  K 4.9 4.9  4.9 5.8*  CL 105 107  --   CO2 16*  --   --   GLUCOSE 134* 90  --   BUN 22 23  --   CREATININE 1.65* 1.50*  --   CALCIUM 8.0*  --   --    GFR: Estimated Creatinine Clearance: 39.2 mL/min (A) (by C-G formula based on SCr of 1.5 mg/dL (H)). Recent Labs  Lab 03/13/2024 0010 03/22/2024 0036  WBC 7.9  --   LATICACIDVEN  --  6.1*    Liver Function Tests: Recent Labs  Lab 03/29/2024 0010  AST 97*  ALT 61*  ALKPHOS 95  BILITOT 1.9*  PROT 5.2*  ALBUMIN 2.8*   Recent Labs  Lab 04/02/2024 0010  LIPASE 20   Recent Labs  Lab 04/09/2024 0011  AMMONIA 17    ABG    Component Value Date/Time   PHART 6.990 (LL) 04/09/2024 0332   PCO2ART 51.6 (H) 03/25/2024 0332   PO2ART 98 04/08/2024 0332   HCO3 12.6 (L) 04/07/2024 0332   TCO2 14 (L) 03/19/2024 0332   ACIDBASEDEF 19.0 (H) 04/07/2024 0332   O2SAT 93 03/23/2024 0332     Coagulation Profile: Recent Labs  Lab 03/30/2024 0010  INR 1.3*    Cardiac Enzymes: No results for input(s): CKTOTAL, CKMB, CKMBINDEX, TROPONINI in the last 168 hours.  HbA1C: Hemoglobin A1C  Date/Time Value Ref Range Status  07/22/2020 08:59 AM 5.2 4.0 - 5.6 % Final   Hgb A1c MFr Bld  Date/Time Value Ref Range Status  04/08/2021 03:42 PM 5.6 4.8 - 5.6 % Final    Comment:    (NOTE)         Prediabetes: 5.7 - 6.4         Diabetes: >6.4         Glycemic control for adults with diabetes: <7.0     CBG: Recent Labs  Lab 03/26/2024 0006 04/03/2024 0008 03/26/2024 0025 04/02/2024 0118 03/16/2024 0219  GLUCAP 33* 27* 116* 113* 84    Review of Systems:   Intubated   Past Medical History:  He,  has a past medical history of Acute heart failure (HCC) (04/08/2021), Acute HFrEF (heart failure with reduced ejection  fraction) (HCC) (04/07/2021), Arthritis, Cancer (HCC), Full dentures, Hypertension, Kidney stone on left side (last stone 4-5 yrs ago), Panic disorder, and Syncope.   Surgical History:   Past Surgical History:  Procedure Laterality Date   CARPAL METACARPAL FUSION WITH DISTAL RADIAL BONE GRAFT Left 05/30/2013   Procedure: LEFT THUMB METACARPAL JOINT FUSION;  Surgeon: Hedy Living, MD;  Location: [facility];  Service: Orthopedics;  Laterality: Left;   ESOPHAGOGASTRODUODENOSCOPY N/A 02/12/2014   Procedure: ESOPHAGOGASTRODUODENOSCOPY (EGD);  Surgeon: Nannette Babe, MD;  Location: [facility] ENDOSCOPY;  Service: Endoscopy;  Laterality: N/A;   FOREIGN BODY REMOVAL N/A 02/12/2014   Procedure: FOREIGN BODY REMOVAL;  Surgeon: Nannette Babe, MD;  Location: [facility] ENDOSCOPY;  Service: Endoscopy;  Laterality: N/A;   OPEN REDUCTION INTERNAL FIXATION (ORIF) DISTAL RADIAL FRACTURE Left 11/29/2012   Procedure: OPEN REDUCTION INTERNAL FIXATION (ORIF) DISTAL RADIAL FRACTURE;  Surgeon: Hedy Living, MD;  Location: [facility];  Service: Orthopedics;  Laterality: Left;  LEFT DISTAL RADIUS OSTEOTOMY WITH BONE GRAFT   RIGHT/LEFT HEART CATH AND CORONARY ANGIOGRAPHY N/A 04/09/2021   Procedure: RIGHT/LEFT HEART CATH AND CORONARY ANGIOGRAPHY;  Surgeon: Mardell Shade, MD;  Location: [facility];  Service: Cardiovascular;  Laterality: N/A;   TENDON REPAIR Left 02/07/2013   Procedure: LEFT EXTENSOR INDICIS PROPRIUS  TO EXTENSOR POLLICIS LONGUS TENDON TRANSFER;  Surgeon: Hedy Living, MD;  Location: [facility];  Service: Orthopedics;  Laterality: Left;   WISDOM TOOTH EXTRACTION     WRIST SURGERY     fx only     Social History:   reports that he quit smoking about 3 years ago. His smoking use included cigars. He has never used smokeless tobacco. He reports that he does not drink alcohol and does not use drugs.   Family History:  His family history includes Heart  attack in his father; Sudden Cardiac Death (age of onset: 66) in his father. There is no history of Diabetes, Hyperlipidemia, or Hypertension.   Allergies Allergies  Allergen Reactions   Rituxan  [Rituximab ] Other (See Comments) and Cough    Mild chest discomfort & rigors   Hydrocodone  Itching and Nausea And Vomiting     Home Medications  Prior to Admission medications   Medication Sig Start Date End Date Taking? Authorizing Provider  acetaminophen  (TYLENOL ) 500 MG tablet Take 500 mg by mouth every 6 (six) hours as needed.    [provider]  albuterol  (PROAIR  HFA) 108 (90 Base) MCG/ACT inhaler Inhale 2 puffs into the lungs every 6 (six) hours as needed for wheezing or shortness of breath. 05/30/23   Aurora Lees, DO  aspirin  EC 325 MG tablet Take 1 tablet (325 mg total) by mouth daily. 01/11/24   Amin, Ankit C, MD  carvedilol  (COREG ) 3.125 MG tablet Take 1 tablet (3.125 mg total) by mouth 2 (two) times daily. NEEDS FOLLOW UP APPOINTMENT FOR MORE REFILLS 01/11/24   Bensimhon, Rheta Celestine, MD  chlorhexidine  (HIBICLENS ) 4 % external liquid Apply topically daily as needed. Dilute with water and clean wound with this.  Rinse thoroughly afterwards Patient not taking: Reported on 01/04/2024 05/24/23   Ethlyn Herd, MD  clobetasol  cream (TEMOVATE ) 0.05 % Apply 1 Application topically 2 (two) times daily. Apply topically to the arms and hands twice daily for up to two weeks 01/05/23   Dellar Fenton, DO  colchicine  0.6 MG tablet Take 1 tablet (0.6 mg total) by mouth 2 (two) times daily. 01/10/24   Amin, Ankit C, MD  dapagliflozin  propanediol (FARXIGA ) 10 MG TABS tablet Take 1 tablet (10 mg total) by mouth daily before breakfast. NEEDS FOLLOW UP APPOINTMENT FOR ANYMORE REFILLS 08/15/23   Bensimhon, Rheta Celestine, MD  diazepam  (VALIUM ) 5 MG tablet Take 1 tablet (5 mg total) by mouth every 12 (twelve) hours as needed for anxiety. 03/15/24   Pickenpack-Cousar, Athena N, NP  DULoxetine  (CYMBALTA ) 30 MG capsule  Take 1 capsule (30 mg total) by mouth daily. 01/10/24   Amin, Ankit C, MD  eplerenone  (INSPRA ) 25 MG tablet Take 0.5 tablets (12.5 mg total) by mouth daily. NEEDS FOLLOW UP APPOINTMENT FOR MORE REFILLS 01/10/24   Maggie Schooner, MD  famotidine  (PEPCID ) 20 MG tablet Take 1 tablet (20 mg total) by mouth at bedtime. 01/10/24 04/09/24  Amin, Katharyn Pall, MD  ferrous sulfate  (FEROSUL) 325 (65 FE) MG tablet Take 1 tablet by mouth daily with breakfast. Please take with a source of Vitamin C 03/14/23   Dorsey, John T IV, MD  furosemide  (LASIX ) 40 MG tablet Take 1 tablet (40 mg total) by mouth daily as needed for fluid or edema. 01/10/24   Amin, Ankit C, MD  ibrutinib  (IMBRUVICA ) 420 MG tablet Take 1 tablet (420 mg total) by mouth daily. 02/15/24   Ander Bame, MD  methadone  (DOLOPHINE ) 10 MG tablet Take 1 tablet (10 mg total) by mouth every 8 (eight) hours. 03/02/24   Pickenpack-Cousar, Athena N, NP  naloxone  (NARCAN ) nasal spray 4 mg/0.1 mL Take for overdose 01/23/22   Rosealee Concha, MD  naloxone  (NARCAN ) nasal spray 4 mg/0.1 mL Take for overdose as directed 01/23/22   Rosealee Concha, MD  ondansetron  (ZOFRAN ) 8 MG tablet Take 1 tablet (8 mg) by mouth every 8 hours as needed. 06/06/23   Pickenpack-Cousar, Athena N, NP  Oxycodone  HCl 10 MG TABS Take 1 tablet (10 mg total) by mouth every 4 (four) hours as needed. 03/10/24   Pickenpack-Cousar, Athena N, NP  sacubitril -valsartan  (ENTRESTO ) 24-26 MG Take 1 tablet by mouth 2 (two) times daily. 01/10/24   Amin, Ankit C, MD  sildenafil  (VIAGRA ) 50 MG tablet Take 1 tablet (50 mg total) by mouth daily as needed for erectile dysfunction. 11/04/23   Ander Bame, MD     Critical care time: 60 min     Madelynn Schilder, MD [facility]

## 2024-04-10 NOTE — ED Triage Notes (Addendum)
 Pt BIB EMS from home with c/o SHOB. Pt was found on the toilet cold, cool, and clammy by ems. Pt on NRB upon arrival. Unable to get a true pulse ox.  Bp 70/palp  NS given by ems  Cbg 90

## 2024-04-10 DEATH — deceased
